# Patient Record
Sex: Male | Born: 1965 | Race: White | Hispanic: Yes | Marital: Married | Smoking: Former smoker
Health system: Southern US, Community
[De-identification: ages and names within clinical notes are randomized; demographics above are authoritative.]

## PROBLEM LIST (undated history)

## (undated) DIAGNOSIS — K219 Gastro-esophageal reflux disease without esophagitis: Secondary | ICD-10-CM

## (undated) DIAGNOSIS — Z87442 Personal history of urinary calculi: Secondary | ICD-10-CM

## (undated) DIAGNOSIS — R200 Anesthesia of skin: Secondary | ICD-10-CM

## (undated) DIAGNOSIS — I10 Essential (primary) hypertension: Secondary | ICD-10-CM

## (undated) DIAGNOSIS — R053 Chronic cough: Secondary | ICD-10-CM

## (undated) HISTORY — DX: Gastro-esophageal reflux disease without esophagitis: K21.9

---

## 2020-08-06 ENCOUNTER — Emergency Department (HOSPITAL_COMMUNITY): Payer: BC Managed Care – PPO

## 2020-08-06 ENCOUNTER — Inpatient Hospital Stay (HOSPITAL_COMMUNITY)
Admission: EM | Admit: 2020-08-06 | Discharge: 2020-09-04 | DRG: 871 | Disposition: A | Discharge status: Home Health | Payer: BC Managed Care – PPO | Attending: Internal Medicine | Admitting: Internal Medicine

## 2020-08-06 ENCOUNTER — Other Ambulatory Visit: Payer: Self-pay

## 2020-08-06 DIAGNOSIS — E872 Acidosis: Secondary | ICD-10-CM | POA: Diagnosis present

## 2020-08-06 DIAGNOSIS — M79605 Pain in left leg: Secondary | ICD-10-CM | POA: Diagnosis not present

## 2020-08-06 DIAGNOSIS — U071 COVID-19: Secondary | ICD-10-CM | POA: Diagnosis not present

## 2020-08-06 DIAGNOSIS — I8290 Acute embolism and thrombosis of unspecified vein: Secondary | ICD-10-CM

## 2020-08-06 DIAGNOSIS — J9383 Other pneumothorax: Secondary | ICD-10-CM | POA: Diagnosis not present

## 2020-08-06 DIAGNOSIS — R578 Other shock: Secondary | ICD-10-CM

## 2020-08-06 DIAGNOSIS — I469 Cardiac arrest, cause unspecified: Secondary | ICD-10-CM | POA: Diagnosis not present

## 2020-08-06 DIAGNOSIS — K3189 Other diseases of stomach and duodenum: Secondary | ICD-10-CM | POA: Diagnosis not present

## 2020-08-06 DIAGNOSIS — N179 Acute kidney failure, unspecified: Secondary | ICD-10-CM | POA: Diagnosis present

## 2020-08-06 DIAGNOSIS — F419 Anxiety disorder, unspecified: Secondary | ICD-10-CM | POA: Diagnosis present

## 2020-08-06 DIAGNOSIS — R0902 Hypoxemia: Secondary | ICD-10-CM

## 2020-08-06 DIAGNOSIS — I5041 Acute combined systolic (congestive) and diastolic (congestive) heart failure: Secondary | ICD-10-CM | POA: Diagnosis present

## 2020-08-06 DIAGNOSIS — I82403 Acute embolism and thrombosis of unspecified deep veins of lower extremity, bilateral: Secondary | ICD-10-CM | POA: Diagnosis not present

## 2020-08-06 DIAGNOSIS — J159 Unspecified bacterial pneumonia: Secondary | ICD-10-CM | POA: Diagnosis not present

## 2020-08-06 DIAGNOSIS — Z01818 Encounter for other preprocedural examination: Secondary | ICD-10-CM

## 2020-08-06 DIAGNOSIS — R7989 Other specified abnormal findings of blood chemistry: Secondary | ICD-10-CM | POA: Diagnosis present

## 2020-08-06 DIAGNOSIS — T501X5A Adverse effect of loop [high-ceiling] diuretics, initial encounter: Secondary | ICD-10-CM | POA: Diagnosis present

## 2020-08-06 DIAGNOSIS — E861 Hypovolemia: Secondary | ICD-10-CM | POA: Diagnosis not present

## 2020-08-06 DIAGNOSIS — Z23 Encounter for immunization: Secondary | ICD-10-CM

## 2020-08-06 DIAGNOSIS — J982 Interstitial emphysema: Secondary | ICD-10-CM | POA: Diagnosis not present

## 2020-08-06 DIAGNOSIS — R079 Chest pain, unspecified: Secondary | ICD-10-CM

## 2020-08-06 DIAGNOSIS — A4189 Other specified sepsis: Principal | ICD-10-CM | POA: Diagnosis present

## 2020-08-06 DIAGNOSIS — I248 Other forms of acute ischemic heart disease: Secondary | ICD-10-CM | POA: Diagnosis not present

## 2020-08-06 DIAGNOSIS — E875 Hyperkalemia: Secondary | ICD-10-CM | POA: Diagnosis not present

## 2020-08-06 DIAGNOSIS — R0602 Shortness of breath: Secondary | ICD-10-CM | POA: Diagnosis present

## 2020-08-06 DIAGNOSIS — I340 Nonrheumatic mitral (valve) insufficiency: Secondary | ICD-10-CM | POA: Diagnosis not present

## 2020-08-06 DIAGNOSIS — R739 Hyperglycemia, unspecified: Secondary | ICD-10-CM | POA: Diagnosis not present

## 2020-08-06 DIAGNOSIS — R680 Hypothermia, not associated with low environmental temperature: Secondary | ICD-10-CM | POA: Diagnosis not present

## 2020-08-06 DIAGNOSIS — E871 Hypo-osmolality and hyponatremia: Secondary | ICD-10-CM | POA: Diagnosis not present

## 2020-08-06 DIAGNOSIS — J069 Acute upper respiratory infection, unspecified: Secondary | ICD-10-CM | POA: Diagnosis not present

## 2020-08-06 DIAGNOSIS — K661 Hemoperitoneum: Secondary | ICD-10-CM | POA: Diagnosis not present

## 2020-08-06 DIAGNOSIS — D62 Acute posthemorrhagic anemia: Secondary | ICD-10-CM | POA: Diagnosis not present

## 2020-08-06 DIAGNOSIS — J9601 Acute respiratory failure with hypoxia: Secondary | ICD-10-CM | POA: Diagnosis not present

## 2020-08-06 DIAGNOSIS — D696 Thrombocytopenia, unspecified: Secondary | ICD-10-CM | POA: Diagnosis present

## 2020-08-06 DIAGNOSIS — R7401 Elevation of levels of liver transaminase levels: Secondary | ICD-10-CM

## 2020-08-06 DIAGNOSIS — N200 Calculus of kidney: Secondary | ICD-10-CM | POA: Diagnosis present

## 2020-08-06 DIAGNOSIS — J1282 Pneumonia due to coronavirus disease 2019: Secondary | ICD-10-CM | POA: Diagnosis not present

## 2020-08-06 DIAGNOSIS — R609 Edema, unspecified: Secondary | ICD-10-CM | POA: Diagnosis not present

## 2020-08-06 DIAGNOSIS — I110 Hypertensive heart disease with heart failure: Secondary | ICD-10-CM | POA: Diagnosis present

## 2020-08-06 DIAGNOSIS — R6520 Severe sepsis without septic shock: Secondary | ICD-10-CM | POA: Diagnosis present

## 2020-08-06 DIAGNOSIS — J9690 Respiratory failure, unspecified, unspecified whether with hypoxia or hypercapnia: Secondary | ICD-10-CM

## 2020-08-06 DIAGNOSIS — J9600 Acute respiratory failure, unspecified whether with hypoxia or hypercapnia: Secondary | ICD-10-CM

## 2020-08-06 DIAGNOSIS — I5021 Acute systolic (congestive) heart failure: Secondary | ICD-10-CM | POA: Diagnosis not present

## 2020-08-06 DIAGNOSIS — J189 Pneumonia, unspecified organism: Secondary | ICD-10-CM

## 2020-08-06 DIAGNOSIS — J80 Acute respiratory distress syndrome: Secondary | ICD-10-CM | POA: Diagnosis not present

## 2020-08-06 DIAGNOSIS — Z79899 Other long term (current) drug therapy: Secondary | ICD-10-CM | POA: Diagnosis not present

## 2020-08-06 DIAGNOSIS — R58 Hemorrhage, not elsewhere classified: Secondary | ICD-10-CM | POA: Diagnosis not present

## 2020-08-06 HISTORY — DX: COVID-19: U07.1

## 2020-08-06 HISTORY — DX: Acute respiratory failure, unspecified whether with hypoxia or hypercapnia: J96.00

## 2020-08-06 HISTORY — DX: Pneumonia, unspecified organism: J18.9

## 2020-08-06 LAB — CBC WITH DIFFERENTIAL/PLATELET
Abs Immature Granulocytes: 0.55 10*3/uL — ABNORMAL HIGH (ref 0.00–0.07)
Basophils Absolute: 0.1 10*3/uL (ref 0.0–0.1)
Basophils Relative: 0 %
Eosinophils Absolute: 0 10*3/uL (ref 0.0–0.5)
Eosinophils Relative: 0 %
HCT: 49.1 % (ref 39.0–52.0)
Hemoglobin: 15.9 g/dL (ref 13.0–17.0)
Immature Granulocytes: 3 %
Lymphocytes Relative: 7 %
Lymphs Abs: 1.5 10*3/uL (ref 0.7–4.0)
MCH: 30.6 pg (ref 26.0–34.0)
MCHC: 32.4 g/dL (ref 30.0–36.0)
MCV: 94.6 fL (ref 80.0–100.0)
Monocytes Absolute: 0.6 10*3/uL (ref 0.1–1.0)
Monocytes Relative: 3 %
Neutro Abs: 18.6 10*3/uL — ABNORMAL HIGH (ref 1.7–7.7)
Neutrophils Relative %: 87 %
Platelets: 344 10*3/uL (ref 150–400)
RBC: 5.19 MIL/uL (ref 4.22–5.81)
RDW: 14.1 % (ref 11.5–15.5)
WBC: 21.3 10*3/uL — ABNORMAL HIGH (ref 4.0–10.5)
nRBC: 0.4 % — ABNORMAL HIGH (ref 0.0–0.2)

## 2020-08-06 LAB — COMPREHENSIVE METABOLIC PANEL
ALT: 127 U/L — ABNORMAL HIGH (ref 0–44)
AST: 96 U/L — ABNORMAL HIGH (ref 15–41)
Albumin: 2.4 g/dL — ABNORMAL LOW (ref 3.5–5.0)
Alkaline Phosphatase: 104 U/L (ref 38–126)
Anion gap: 25 — ABNORMAL HIGH (ref 5–15)
BUN: 30 mg/dL — ABNORMAL HIGH (ref 6–20)
CO2: 15 mmol/L — ABNORMAL LOW (ref 22–32)
Calcium: 8.2 mg/dL — ABNORMAL LOW (ref 8.9–10.3)
Chloride: 96 mmol/L — ABNORMAL LOW (ref 98–111)
Creatinine, Ser: 1.95 mg/dL — ABNORMAL HIGH (ref 0.61–1.24)
GFR calc Af Amer: 43 mL/min — ABNORMAL LOW (ref >60)
GFR calc non Af Amer: 37 mL/min — ABNORMAL LOW (ref >60)
Glucose, Bld: 178 mg/dL — ABNORMAL HIGH (ref 70–99)
Potassium: 5.1 mmol/L (ref 3.5–5.1)
Sodium: 136 mmol/L (ref 135–145)
Total Bilirubin: 1.7 mg/dL — ABNORMAL HIGH (ref 0.3–1.2)
Total Protein: 7.4 g/dL (ref 6.5–8.1)

## 2020-08-06 LAB — C-REACTIVE PROTEIN: CRP: 34.5 mg/dL — ABNORMAL HIGH (ref <1.0)

## 2020-08-06 LAB — FERRITIN: Ferritin: 1450 ng/mL — ABNORMAL HIGH (ref 24–336)

## 2020-08-06 LAB — D-DIMER, QUANTITATIVE: D-Dimer, Quant: >20 ug/mL-FEU — ABNORMAL HIGH (ref 0.00–0.50)

## 2020-08-06 LAB — LACTATE DEHYDROGENASE: LDH: 1073 U/L — ABNORMAL HIGH (ref 98–192)

## 2020-08-06 LAB — FIBRINOGEN: Fibrinogen: 787 mg/dL — ABNORMAL HIGH (ref 210–475)

## 2020-08-06 LAB — PROCALCITONIN: Procalcitonin: 6.47 ng/mL

## 2020-08-06 LAB — LACTIC ACID, PLASMA
Lactic Acid, Venous: 8.9 mmol/L (ref 0.5–1.9)
Lactic Acid, Venous: 6.2 mmol/L (ref 0.5–1.9)

## 2020-08-06 LAB — PROTIME-INR
INR: 1.4 — ABNORMAL HIGH (ref 0.8–1.2)
Prothrombin Time: 16.9 seconds — ABNORMAL HIGH (ref 11.4–15.2)

## 2020-08-06 LAB — TRIGLYCERIDES: Triglycerides: 242 mg/dL — ABNORMAL HIGH (ref <150)

## 2020-08-06 LAB — SARS CORONAVIRUS 2 BY RT PCR (HOSPITAL ORDER, PERFORMED IN ~~LOC~~ HOSPITAL LAB): SARS Coronavirus 2: POSITIVE — AB

## 2020-08-06 MED ORDER — LACTATED RINGERS IV BOLUS
1000.0000 mL | Freq: Once | INTRAVENOUS | Status: AC
  Administered 2020-08-06: 1000 mL via INTRAVENOUS

## 2020-08-06 MED ORDER — SODIUM CHLORIDE 0.9 % IV SOLN
100.0000 mg | INTRAVENOUS | Status: AC
  Administered 2020-08-07 – 2020-08-10 (×4): 100 mg via INTRAVENOUS
  Filled 2020-08-06 (×5): qty 20

## 2020-08-06 MED ORDER — SODIUM CHLORIDE 0.9 % IV SOLN
200.0000 mg | Freq: Once | INTRAVENOUS | Status: AC
  Administered 2020-08-07: 200 mg via INTRAVENOUS
  Filled 2020-08-06: qty 40

## 2020-08-06 MED ORDER — DEXAMETHASONE SODIUM PHOSPHATE 10 MG/ML IJ SOLN
10.0000 mg | Freq: Once | INTRAMUSCULAR | Status: AC
  Administered 2020-08-06: 10 mg via INTRAVENOUS
  Filled 2020-08-06: qty 1

## 2020-08-06 NOTE — Progress Notes (Signed)
eLink Physician-Brief Progress Note Patient Name: Deacon Gadbois DOB: January 06, 1966 MRN: [id number]   Date of Service  08/06/2020  HPI/Events of Note  Dr Myrtis Ser discussed about Mr Calieb. yr old Covid tested as OPD +, results not available was hypoxemic via EMS. Now stable on BiPAP. CxR mild air space densities. Elevated wbc and d dimer, LDH.  Now stable.  eICU Interventions  - follow Covid test - ok to go to step down for now. If worsening give a call back for ICU step up.      Intervention Category Intermediate Interventions: Communication with other healthcare providers and/or family  Ranee Gosselin 08/06/2020, 9:05 PM

## 2020-08-06 NOTE — ED Provider Notes (Addendum)
[facility] Provider Note   CSN: [id number] Arrival date & time: 08/06/20  1914     History Chief Complaint  Patient presents with  . Respiratory Distress    Henry OhmOrlando May is a 54 y.o. male.   Shortness of Breath Severity:  Severe Onset quality:  Gradual Timing:  Constant Progression:  Worsening Context: URI (covid)   Relieved by:  Oxygen (cpap) Worsened by:  Nothing Ineffective treatments:  None tried      History reviewed. No pertinent past medical history.  Patient Active Problem List   Diagnosis Date Noted  . ARF (acute renal failure) (HCC) 08/07/2020  . Elevated LFTs 08/07/2020  . Acute respiratory disease due to COVID-19 virus 08/07/2020  . Pneumonia due to COVID-19 virus 08/07/2020  . Acute respiratory failure due to COVID-19 [facility](HCC) 08/06/2020     The histories are not reviewed yet. Please review them in the "History" navigator section and refresh this SmartLink.     Family History  Problem Relation Age of Onset  . Diabetes Mellitus II Neg Hx     Social History   Tobacco Use  . Smoking status: Never Smoker  . Smokeless tobacco: Never Used  Substance Use Topics  . Alcohol use: Not on file  . Drug use: Not on file    Home Medications Prior to Admission medications   Medication Sig Start Date End Date Taking? Authorizing Provider  acetaminophen (TYLENOL) 325 MG tablet Take 325-650 mg by mouth every 6 (six) hours as needed for mild pain, fever or headache.   Yes [provider]  aspirin EC 81 MG tablet Take 81 mg by mouth daily. Swallow whole.   Yes [provider]  naproxen sodium (ALEVE) 220 MG tablet Take 220 mg by mouth 2 (two) times daily as needed (fever, aches).   Yes [provider]  ampicillin (PRINCIPEN) 500 MG capsule Take 1,000 mg by mouth once.    [provider]    Allergies    Patient has no known allergies.  Review of Systems   Review of Systems   Unable to perform ROS: Acuity of condition  Respiratory: Positive for shortness of breath.     Physical Exam Updated Vital Signs BP (!) 148/99   Pulse 93   Temp 99.5 F (37.5 C) (Oral)   Resp (!) 33   Ht 6' (1.829 m)   Wt 106.6 kg   SpO2 90%   BMI 31.87 kg/m   Physical Exam Vitals and nursing note reviewed. Exam conducted with a chaperone present.  Constitutional:      General: He is in acute distress.     Appearance: He is ill-appearing.  HENT:     Head: Normocephalic and atraumatic.     Nose: No rhinorrhea.  Eyes:     General:        Right eye: No discharge.        Left eye: No discharge.     Conjunctiva/sclera: Conjunctivae normal.  Cardiovascular:     Rate and Rhythm: Regular rhythm. Tachycardia present.  Pulmonary:     Effort: Respiratory distress present.     Breath sounds: No stridor. Rhonchi present.  Abdominal:     General: Abdomen is flat. There is no distension.     Palpations: Abdomen is soft.  Musculoskeletal:        General: No deformity or signs of injury.  Skin:    General: Skin is warm and dry.     Comments:  Diaphoretic  Neurological:     General: No focal deficit present.     Mental Status: He is alert. Mental status is at baseline.     Motor: No weakness.     ED Results / Procedures / Treatments   Labs (all labs ordered are listed, but only abnormal results are displayed) Labs Reviewed  SARS CORONAVIRUS 2 BY RT PCR (HOSPITAL ORDER, PERFORMED IN [facility]) - Abnormal; Notable for the following components:      Result Value   SARS Coronavirus 2 POSITIVE (*)    All other components within normal limits  COMPREHENSIVE METABOLIC PANEL - Abnormal; Notable for the following components:   Chloride 96 (*)    CO2 15 (*)    Glucose, Bld 178 (*)    BUN 30 (*)    Creatinine, Ser 1.95 (*)    Calcium 8.2 (*)    Albumin 2.4 (*)    AST 96 (*)    ALT 127 (*)    Total Bilirubin 1.7 (*)    GFR calc non Af Amer 37 (*)    GFR calc Af  Amer 43 (*)    Anion gap 25 (*)    All other components within normal limits  LACTIC ACID, PLASMA - Abnormal; Notable for the following components:   Lactic Acid, Venous 8.9 (*)    All other components within normal limits  LACTIC ACID, PLASMA - Abnormal; Notable for the following components:   Lactic Acid, Venous 6.2 (*)    All other components within normal limits  CBC WITH DIFFERENTIAL/PLATELET - Abnormal; Notable for the following components:   WBC 21.3 (*)    nRBC 0.4 (*)    Neutro Abs 18.6 (*)    Abs Immature Granulocytes 0.55 (*)    All other components within normal limits  PROTIME-INR - Abnormal; Notable for the following components:   Prothrombin Time 16.9 (*)    INR 1.4 (*)    All other components within normal limits  URINALYSIS, ROUTINE W REFLEX MICROSCOPIC - Abnormal; Notable for the following components:   Color, Urine AMBER (*)    APPearance HAZY (*)    Hgb urine dipstick SMALL (*)    Protein, ur 100 (*)    All other components within normal limits  D-DIMER, QUANTITATIVE (NOT AT [facility]) - Abnormal; Notable for the following components:   D-Dimer, Quant >20.00 (*)    All other components within normal limits  LACTATE DEHYDROGENASE - Abnormal; Notable for the following components:   LDH 1,073 (*)    All other components within normal limits  FERRITIN - Abnormal; Notable for the following components:   Ferritin 1,450 (*)    All other components within normal limits  FIBRINOGEN - Abnormal; Notable for the following components:   Fibrinogen 787 (*)    All other components within normal limits  C-REACTIVE PROTEIN - Abnormal; Notable for the following components:   CRP 34.5 (*)    All other components within normal limits  TRIGLYCERIDES - Abnormal; Notable for the following components:   Triglycerides 242 (*)    All other components within normal limits  BASIC METABOLIC PANEL - Abnormal; Notable for the following components:   CO2 20 (*)    Glucose, Bld 182 (*)     BUN 35 (*)    Creatinine, Ser 1.58 (*)    Calcium 7.8 (*)    GFR calc non Af Amer 48 (*)    GFR calc Af Amer 56 (*)  Anion gap 17 (*)    All other components within normal limits  CBC - Abnormal; Notable for the following components:   WBC 14.5 (*)    All other components within normal limits  CREATININE, SERUM - Abnormal; Notable for the following components:   Creatinine, Ser 1.72 (*)    GFR calc non Af Amer 43 (*)    GFR calc Af Amer 50 (*)    All other components within normal limits  LACTIC ACID, PLASMA - Abnormal; Notable for the following components:   Lactic Acid, Venous 4.5 (*)    All other components within normal limits  LACTIC ACID, PLASMA - Abnormal; Notable for the following components:   Lactic Acid, Venous 4.3 (*)    All other components within normal limits  BRAIN NATRIURETIC PEPTIDE - Abnormal; Notable for the following components:   B Natriuretic Peptide 642.8 (*)    All other components within normal limits  HEMOGLOBIN A1C - Abnormal; Notable for the following components:   Hgb A1c MFr Bld 6.5 (*)    All other components within normal limits  MAGNESIUM - Abnormal; Notable for the following components:   Magnesium 2.8 (*)    All other components within normal limits  LACTIC ACID, PLASMA - Abnormal; Notable for the following components:   Lactic Acid, Venous 4.0 (*)    All other components within normal limits  LACTIC ACID, PLASMA - Abnormal; Notable for the following components:   Lactic Acid, Venous 2.7 (*)    All other components within normal limits  PROTIME-INR - Abnormal; Notable for the following components:   Prothrombin Time 15.7 (*)    INR 1.3 (*)    All other components within normal limits  I-STAT ARTERIAL BLOOD GAS, ED - Abnormal; Notable for the following components:   pH, Arterial 7.452 (*)    pCO2 arterial 31.8 (*)    Calcium, Ion 1.08 (*)    All other components within normal limits  CBG MONITORING, ED - Abnormal; Notable for the  following components:   Glucose-Capillary 177 (*)    All other components within normal limits  CBG MONITORING, ED - Abnormal; Notable for the following components:   Glucose-Capillary 167 (*)    All other components within normal limits  CBG MONITORING, ED - Abnormal; Notable for the following components:   Glucose-Capillary 141 (*)    All other components within normal limits  CBG MONITORING, ED - Abnormal; Notable for the following components:   Glucose-Capillary 158 (*)    All other components within normal limits  TROPONIN I (HIGH SENSITIVITY) - Abnormal; Notable for the following components:   Troponin I (High Sensitivity) 255 (*)    All other components within normal limits  TROPONIN I (HIGH SENSITIVITY) - Abnormal; Notable for the following components:   Troponin I (High Sensitivity) 204 (*)    All other components within normal limits  CULTURE, BLOOD (ROUTINE X 2)  CULTURE, BLOOD (ROUTINE X 2)  CULTURE, BLOOD (ROUTINE X 2)  PROCALCITONIN  HIV ANTIBODY (ROUTINE TESTING W REFLEX)  BLOOD GAS, ARTERIAL  CBC WITH DIFFERENTIAL/PLATELET  COMPREHENSIVE METABOLIC PANEL  C-REACTIVE PROTEIN  D-DIMER, QUANTITATIVE (NOT AT [facility])  PROTIME-INR  ABO/RH    EKG EKG Interpretation  Date/Time:  Sunday August 06 2020 23:54:24 EDT Ventricular Rate:  108 PR Interval:    QRS Duration: 105 QT Interval:  412 QTC Calculation: 553 R Axis:   60 Text Interpretation: Sinus tachycardia Low voltage, precordial leads Borderline T abnormalities, anterior leads  Prolonged QT interval No old tracing to compare Confirmed by Jacalyn Lefevre [phone]) on 08/07/2020 10:28:31 AM   Radiology DG Chest Portable 1 View  Result Date: 08/06/2020 CLINICAL DATA:  COVID positive. EXAM: PORTABLE CHEST 1 VIEW COMPARISON:  None. FINDINGS: Mild infiltrates are seen along the periphery of the right lung with mild to moderate severity left basilar infiltrate noted. There is no evidence of a pleural effusion or  pneumothorax. The heart size and mediastinal contours are within normal limits. The visualized skeletal structures are unremarkable. IMPRESSION: Mild right lung infiltrates with mild to moderate severity left basilar infiltrate. Electronically Signed   By: Aram Candela M.D.   On: 08/06/2020 19:45   ECHOCARDIOGRAM COMPLETE  Result Date: 08/07/2020    ECHOCARDIOGRAM REPORT   Patient Name:   Henry May Date of Exam: 08/07/2020 Medical Rec #:  [id number]      Height:       72.0 in Accession #:    [id number]     Weight:       235.0 lb Date of Birth:  11-Feb-1966      BSA:          2.282 m Patient Age:    54 years       BP:           132/103 mmHg Patient Gender: M              HR:           97 bpm. Exam Location:  Inpatient Procedure: 2D Echo, Cardiac Doppler and Color Doppler Indications:    Acute Respiratory Insufficiency 5018.82 / R06.89  History:        Patient has no prior history of Echocardiogram examinations.                 COVID-19 Positive.  Sonographer:    Tiffany Dance Referring Phys: [id number] Charlotte Sanes IMPRESSIONS  1. Left ventricular ejection fraction, by estimation, is 40 to 45%. The left ventricle has mildly decreased function. The left ventricle demonstrates regional wall motion abnormalities (see scoring diagram/findings for description). There is mild left ventricular hypertrophy. Left ventricular diastolic parameters are consistent with Grade I diastolic dysfunction (impaired relaxation). There is severe hypokinesis of the left ventricular, basal-mid inferior wall.  2. Right ventricular systolic function is mildly reduced. The right ventricular size is mildly enlarged.  3. Right atrial size was moderately dilated.  4. The mitral valve is grossly normal. No evidence of mitral valve regurgitation.  5. The aortic valve is tricuspid. Aortic valve regurgitation is not visualized.  6. Aortic dilatation noted. There is borderline dilatation at the level of the sinuses of Valsalva measuring 38  mm.  7. The inferior vena cava is normal in size with <50% respiratory variability, suggesting right atrial pressure of 8 mmHg. FINDINGS  Left Ventricle: Left ventricular ejection fraction, by estimation, is 40 to 45%. The left ventricle has mildly decreased function. The left ventricle demonstrates regional wall motion abnormalities. Severe hypokinesis of the left ventricular, basal-mid inferior wall. The left ventricular internal cavity size was normal in size. There is mild left ventricular hypertrophy. Left ventricular diastolic parameters are consistent with Grade I diastolic dysfunction (impaired relaxation). Indeterminate filling pressures. Right Ventricle: The right ventricular size is mildly enlarged. No increase in right ventricular wall thickness. Right ventricular systolic function is mildly reduced. Left Atrium: Left atrial size was normal in size. Right Atrium: Right atrial size was moderately dilated. Pericardium: There is no evidence of pericardial effusion.  Mitral Valve: The mitral valve is grossly normal. No evidence of mitral valve regurgitation. Tricuspid Valve: The tricuspid valve is grossly normal. Tricuspid valve regurgitation is not demonstrated. Aortic Valve: The aortic valve is tricuspid. Aortic valve regurgitation is not visualized. Pulmonic Valve: The pulmonic valve was not well visualized. Pulmonic valve regurgitation is not visualized. Aorta: Aortic dilatation noted. There is borderline dilatation at the level of the sinuses of Valsalva measuring 38 mm. Venous: The inferior vena cava is normal in size with less than 50% respiratory variability, suggesting right atrial pressure of 8 mmHg. IAS/Shunts: No atrial level shunt detected by color flow Doppler.  LEFT VENTRICLE PLAX 2D LVIDd:         4.97 cm  Diastology LVIDs:         3.91 cm  LV e' lateral: 5.33 cm/s LV PW:         1.16 cm  LV e' medial:  4.24 cm/s LV IVS:        0.97 cm LVOT diam:     2.10 cm LV SV:         48 LV SV Index:   21  LVOT Area:     3.46 cm  RIGHT VENTRICLE            IVC RV Basal diam:  3.34 cm    IVC diam: 1.85 cm RV Mid diam:    2.57 cm RV S prime:     8.81 cm/s TAPSE (M-mode): 2.2 cm LEFT ATRIUM             Index       RIGHT ATRIUM           Index LA diam:        3.00 cm 1.31 cm/m  RA Area:     25.10 cm LA Vol (A2C):   33.6 ml 14.73 ml/m RA Volume:   90.40 ml  39.62 ml/m LA Vol (A4C):   39.3 ml 17.22 ml/m LA Biplane Vol: 37.1 ml 16.26 ml/m  AORTIC VALVE LVOT Vmax:   77.60 cm/s LVOT Vmean:  49.300 cm/s LVOT VTI:    0.138 m  AORTA Ao Root diam: 3.80 cm Ao Asc diam:  3.30 cm MV A velocity: 68.65 cm/s                            SHUNTS                            Systemic VTI:  0.14 m                            Systemic Diam: 2.10 cm Zoila Shutter MD Electronically signed by Zoila Shutter MD Signature Date/Time: 08/07/2020/3:22:46 PM    Final    VAS [place] LOWER EXTREMITY VENOUS (DVT)  Result Date: 08/07/2020  Lower Venous DVTStudy Indications: Edema, and Covid positive.  Comparison Study: No prior. Performing Technologist: Marilynne Halsted RDMS, RVT  Examination Guidelines: A complete evaluation includes B-mode imaging, spectral Doppler, color Doppler, and power Doppler as needed of all accessible portions of each vessel. Bilateral testing is considered an integral part of a complete examination. Limited examinations for reoccurring indications may be performed as noted. The reflux portion of the exam is performed with the patient in reverse Trendelenburg.  +---------+---------------+---------+-----------+----------+--------------+ RIGHT    CompressibilityPhasicitySpontaneityPropertiesThrombus Aging +---------+---------------+---------+-----------+----------+--------------+ CFV  Full           Yes      Yes                                 +---------+---------------+---------+-----------+----------+--------------+ SFJ      Full                                                         +---------+---------------+---------+-----------+----------+--------------+ FV Prox  Full                                                        +---------+---------------+---------+-----------+----------+--------------+ FV Mid   Full                                                        +---------+---------------+---------+-----------+----------+--------------+ FV DistalFull                                                        +---------+---------------+---------+-----------+----------+--------------+ PFV      Full                                                        +---------+---------------+---------+-----------+----------+--------------+ POP      Full           Yes      Yes                                 +---------+---------------+---------+-----------+----------+--------------+ PTV      Full                                                        +---------+---------------+---------+-----------+----------+--------------+ PERO     Partial                                      Acute          +---------+---------------+---------+-----------+----------+--------------+   +---------+---------------+---------+-----------+----------+--------------+ LEFT     CompressibilityPhasicitySpontaneityPropertiesThrombus Aging +---------+---------------+---------+-----------+----------+--------------+ CFV      Full           Yes      Yes                                 +---------+---------------+---------+-----------+----------+--------------+ SFJ  Full                                                        +---------+---------------+---------+-----------+----------+--------------+ FV Prox  Full                                                        +---------+---------------+---------+-----------+----------+--------------+ FV Mid   Full                                                         +---------+---------------+---------+-----------+----------+--------------+ FV DistalNone           No       No                   Acute          +---------+---------------+---------+-----------+----------+--------------+ PFV      Full                                                        +---------+---------------+---------+-----------+----------+--------------+ POP      None           No       No                   Acute          +---------+---------------+---------+-----------+----------+--------------+ PTV      None                                         Acute          +---------+---------------+---------+-----------+----------+--------------+ PERO     None                                         Acute          +---------+---------------+---------+-----------+----------+--------------+     Summary: RIGHT: - Findings consistent with acute deep vein thrombosis involving the right peroneal veins. - No cystic structure found in the popliteal fossa.  LEFT: - Findings consistent with acute deep vein thrombosis involving the left femoral vein, left popliteal vein, left posterior tibial veins, and left peroneal veins.  *See table(s) above for measurements and observations. Electronically signed by Fabienne Bruns MD on 08/07/2020 at 5:10:42 PM.    Final     Procedures .Critical Care Performed by: Sabino Donovan, MD Authorized by: Sabino Donovan, MD   Critical care provider statement:    Critical care time (minutes):  35   Critical care was time spent personally by me on the following activities:  Discussions with consultants, evaluation of patient's response to treatment, examination of patient, ordering and  performing treatments and interventions, ordering and review of laboratory studies, ordering and review of radiographic studies, pulse oximetry, re-evaluation of patient's condition, obtaining history from patient or surrogate, review of old charts and development of treatment  plan with patient or surrogate   (including critical care time)  Medications Ordered in ED Medications  remdesivir 200 mg in sodium chloride 0.9% 250 mL IVPB (0 mg Intravenous Stopped 08/07/20 0308)    Followed by  remdesivir 100 mg in sodium chloride 0.9 % 100 mL IVPB (0 mg Intravenous Stopped 08/07/20 1006)  ondansetron (ZOFRAN) tablet 4 mg (has no administration in time range)    Or  ondansetron (ZOFRAN) injection 4 mg (has no administration in time range)  methylPREDNISolone sodium succinate (SOLU-MEDROL) 125 mg/2 mL injection 53.125 mg (53.125 mg Intravenous Given 08/07/20 1235)  ascorbic acid (VITAMIN C) tablet 500 mg (500 mg Oral Not Given 08/07/20 0945)  zinc sulfate capsule 220 mg (220 mg Oral Not Given 08/07/20 0945)  guaiFENesin-dextromethorphan (ROBITUSSIN DM) 100-10 MG/5ML syrup 10 mL (has no administration in time range)  chlorpheniramine-HYDROcodone (TUSSIONEX) 10-8 MG/5ML suspension 5 mL (has no administration in time range)  docusate sodium (COLACE) capsule 100 mg (has no administration in time range)  polyethylene glycol (MIRALAX / GLYCOLAX) packet 17 g (has no administration in time range)  insulin aspart (novoLOG) injection 0-20 Units (4 Units Subcutaneous Given 08/07/20 1653)  ceFEPIme (MAXIPIME) 2 g in sodium chloride 0.9 % 100 mL IVPB (0 g Intravenous Stopped 08/07/20 1555)  vancomycin (VANCOREADY) IVPB 1250 mg/250 mL (has no administration in time range)  enoxaparin (LOVENOX) injection 108 mg (108 mg Subcutaneous Given 08/07/20 1228)  dexamethasone (DECADRON) injection 10 mg (10 mg Intravenous Given 08/06/20 1940)  lactated ringers bolus 1,000 mL (0 mLs Intravenous Stopped 08/07/20 0709)  cefTRIAXone (ROCEPHIN) 2 g in sodium chloride 0.9 % 100 mL IVPB (0 g Intravenous Stopped 08/07/20 0251)  vancomycin (VANCOREADY) IVPB 2000 mg/400 mL (0 mg Intravenous Stopped 08/07/20 0846)    ED Course  I have reviewed the triage vital signs and the nursing notes.  Pertinent labs &  imaging results that were available during my care of the patient were reviewed by me and considered in my medical decision making (see chart for details).    MDM Rules/Calculators/A&P                          Acute hypoxic respiratory failure in the setting of Covid.  Initially on CPAP with EMS transition to BiPAP and weaned down to high flow nasal cannula 15 L.  Patient was initially tachypneic in the 60s respiratory rate has been improved and work of breathing is improved.  His O2 sats have remained above 88.  He has acute kidney injury he has lactic acidosis.  Chest x-ray findings were multi focal disease.  Steroids and remdesivir given immediately.  IV fluids were given.  Appears to have acute kidney injury however we have no baseline laboratory studies to compare to.  Elevated D-dimer, we will get a CT PE study.  I spoke to the intensivist and he thinks based on this information the patient is stable for the stepdown unit.  I consulted the hospitalist for admission they agreed to see the patient.  CRITICAL CARE Performed by: Sabino Donovan   Total critical care time: 35 minutes  Critical care time was exclusive of separately billable procedures and treating other patients.  Critical care was necessary to treat or prevent imminent  or life-threatening deterioration.  Critical care was time spent personally by me on the following activities: development of treatment plan with patient and/or surrogate as well as nursing, discussions with consultants, evaluation of patient's response to treatment, examination of patient, obtaining history from patient or surrogate, ordering and performing treatments and interventions, ordering and review of laboratory studies, ordering and review of radiographic studies, pulse oximetry and re-evaluation of patient's condition.    Final Clinical Impression(s) / ED Diagnoses Final diagnoses:  Acute hypoxemic respiratory failure (HCC)  COVID-19  Positive D dimer   AKI (acute kidney injury) [facility])    Rx / DC Orders ED Discharge Orders    None       Sabino Donovan, MD 08/06/20 2317    Sabino Donovan, MD 08/07/20 Ebony Cargo

## 2020-08-06 NOTE — Progress Notes (Signed)
Patient sats 95% on 60% Fi02 12/6 bipap.  VT 1200+.  Patient transitioned to salter high flow 15L and NRB 15L with sats 85-90%, lower when patient talking.  RR around 30.  RN, MD aware, BiPap on standby if needed.

## 2020-08-06 NOTE — ED Triage Notes (Signed)
Pt arrived via ems from home due to respiratory distress. Pt was dx with covid 8/12. Pt has been experiencing shob since 8am. EMs states uopon arrival o2 stats were 50% on room arir. Pt placed on NRB sats improved to 80%

## 2020-08-07 ENCOUNTER — Inpatient Hospital Stay (HOSPITAL_COMMUNITY): Payer: BC Managed Care – PPO

## 2020-08-07 ENCOUNTER — Encounter (HOSPITAL_COMMUNITY): Payer: Self-pay | Admitting: Internal Medicine

## 2020-08-07 DIAGNOSIS — U071 COVID-19: Secondary | ICD-10-CM

## 2020-08-07 DIAGNOSIS — J9601 Acute respiratory failure with hypoxia: Secondary | ICD-10-CM

## 2020-08-07 DIAGNOSIS — R609 Edema, unspecified: Secondary | ICD-10-CM

## 2020-08-07 DIAGNOSIS — J069 Acute upper respiratory infection, unspecified: Secondary | ICD-10-CM | POA: Diagnosis present

## 2020-08-07 DIAGNOSIS — N179 Acute kidney failure, unspecified: Secondary | ICD-10-CM

## 2020-08-07 DIAGNOSIS — R7989 Other specified abnormal findings of blood chemistry: Secondary | ICD-10-CM

## 2020-08-07 LAB — BASIC METABOLIC PANEL
Anion gap: 17 — ABNORMAL HIGH (ref 5–15)
BUN: 35 mg/dL — ABNORMAL HIGH (ref 6–20)
CO2: 20 mmol/L — ABNORMAL LOW (ref 22–32)
Calcium: 7.8 mg/dL — ABNORMAL LOW (ref 8.9–10.3)
Chloride: 101 mmol/L (ref 98–111)
Creatinine, Ser: 1.58 mg/dL — ABNORMAL HIGH (ref 0.61–1.24)
GFR calc Af Amer: 56 mL/min — ABNORMAL LOW (ref >60)
GFR calc non Af Amer: 48 mL/min — ABNORMAL LOW (ref >60)
Glucose, Bld: 182 mg/dL — ABNORMAL HIGH (ref 70–99)
Potassium: 4.3 mmol/L (ref 3.5–5.1)
Sodium: 138 mmol/L (ref 135–145)

## 2020-08-07 LAB — URINALYSIS, ROUTINE W REFLEX MICROSCOPIC
Bacteria, UA: NONE SEEN
Bilirubin Urine: NEGATIVE
Glucose, UA: NEGATIVE mg/dL
Ketones, ur: NEGATIVE mg/dL
Leukocytes,Ua: NEGATIVE
Nitrite: NEGATIVE
Protein, ur: 100 mg/dL — AB
Specific Gravity, Urine: 1.026 (ref 1.005–1.030)
pH: 5 (ref 5.0–8.0)

## 2020-08-07 LAB — LACTIC ACID, PLASMA
Lactic Acid, Venous: 4.5 mmol/L (ref 0.5–1.9)
Lactic Acid, Venous: 4.3 mmol/L (ref 0.5–1.9)
Lactic Acid, Venous: 4 mmol/L (ref 0.5–1.9)
Lactic Acid, Venous: 2.7 mmol/L (ref 0.5–1.9)

## 2020-08-07 LAB — CBG MONITORING, ED
Glucose-Capillary: 177 mg/dL — ABNORMAL HIGH (ref 70–99)
Glucose-Capillary: 167 mg/dL — ABNORMAL HIGH (ref 70–99)
Glucose-Capillary: 141 mg/dL — ABNORMAL HIGH (ref 70–99)
Glucose-Capillary: 158 mg/dL — ABNORMAL HIGH (ref 70–99)
Glucose-Capillary: 137 mg/dL — ABNORMAL HIGH (ref 70–99)

## 2020-08-07 LAB — I-STAT ARTERIAL BLOOD GAS, ED
Acid-base deficit: 1 mmol/L (ref 0.0–2.0)
Bicarbonate: 22.2 mmol/L (ref 20.0–28.0)
Calcium, Ion: 1.08 mmol/L — ABNORMAL LOW (ref 1.15–1.40)
HCT: 43 % (ref 39.0–52.0)
Hemoglobin: 14.6 g/dL (ref 13.0–17.0)
O2 Saturation: 97 %
Patient temperature: 98.6
Potassium: 3.8 mmol/L (ref 3.5–5.1)
Sodium: 138 mmol/L (ref 135–145)
TCO2: 23 mmol/L (ref 22–32)
pCO2 arterial: 31.8 mmHg — ABNORMAL LOW (ref 32.0–48.0)
pH, Arterial: 7.452 — ABNORMAL HIGH (ref 7.350–7.450)
pO2, Arterial: 83 mmHg (ref 83.0–108.0)

## 2020-08-07 LAB — CREATININE, SERUM
Creatinine, Ser: 1.72 mg/dL — ABNORMAL HIGH (ref 0.61–1.24)
GFR calc Af Amer: 50 mL/min — ABNORMAL LOW (ref >60)
GFR calc non Af Amer: 43 mL/min — ABNORMAL LOW (ref >60)

## 2020-08-07 LAB — MAGNESIUM: Magnesium: 2.8 mg/dL — ABNORMAL HIGH (ref 1.7–2.4)

## 2020-08-07 LAB — ABO/RH: ABO/RH(D): O POS

## 2020-08-07 LAB — CBC
HCT: 44.5 % (ref 39.0–52.0)
Hemoglobin: 14.6 g/dL (ref 13.0–17.0)
MCH: 30.3 pg (ref 26.0–34.0)
MCHC: 32.8 g/dL (ref 30.0–36.0)
MCV: 92.3 fL (ref 80.0–100.0)
Platelets: 232 10*3/uL (ref 150–400)
RBC: 4.82 MIL/uL (ref 4.22–5.81)
RDW: 14.1 % (ref 11.5–15.5)
WBC: 14.5 10*3/uL — ABNORMAL HIGH (ref 4.0–10.5)
nRBC: 0.1 % (ref 0.0–0.2)

## 2020-08-07 LAB — ECHOCARDIOGRAM COMPLETE
Height: 72 in
S' Lateral: 3.91 cm
Weight: 3760 oz

## 2020-08-07 LAB — HEMOGLOBIN A1C
Hgb A1c MFr Bld: 6.5 % — ABNORMAL HIGH (ref 4.8–5.6)
Mean Plasma Glucose: 139.85 mg/dL

## 2020-08-07 LAB — TROPONIN I (HIGH SENSITIVITY)
Troponin I (High Sensitivity): 255 ng/L (ref <18)
Troponin I (High Sensitivity): 204 ng/L (ref <18)

## 2020-08-07 LAB — HIV ANTIBODY (ROUTINE TESTING W REFLEX): HIV Screen 4th Generation wRfx: NONREACTIVE

## 2020-08-07 LAB — PROTIME-INR
INR: 1.3 — ABNORMAL HIGH (ref 0.8–1.2)
Prothrombin Time: 15.7 seconds — ABNORMAL HIGH (ref 11.4–15.2)

## 2020-08-07 LAB — BRAIN NATRIURETIC PEPTIDE: B Natriuretic Peptide: 642.8 pg/mL — ABNORMAL HIGH (ref 0.0–100.0)

## 2020-08-07 MED ORDER — SODIUM CHLORIDE 0.9 % IV SOLN
2.0000 g | Freq: Three times a day (TID) | INTRAVENOUS | Status: AC
  Administered 2020-08-07 – 2020-08-13 (×21): 2 g via INTRAVENOUS
  Filled 2020-08-07 (×23): qty 2

## 2020-08-07 MED ORDER — SODIUM CHLORIDE 0.9 % IV SOLN
500.0000 mg | INTRAVENOUS | Status: DC
  Administered 2020-08-07: 500 mg via INTRAVENOUS
  Filled 2020-08-07: qty 500

## 2020-08-07 MED ORDER — GUAIFENESIN-DM 100-10 MG/5ML PO SYRP
10.0000 mL | ORAL_SOLUTION | ORAL | Status: DC | PRN
  Administered 2020-08-15: 10 mL via ORAL
  Filled 2020-08-07 (×2): qty 10

## 2020-08-07 MED ORDER — VANCOMYCIN HCL 1250 MG/250ML IV SOLN
1250.0000 mg | INTRAVENOUS | Status: DC
  Administered 2020-08-08: 1250 mg via INTRAVENOUS
  Filled 2020-08-07: qty 250

## 2020-08-07 MED ORDER — ONDANSETRON HCL 4 MG PO TABS: 4.0000 mg | ORAL_TABLET | Freq: Four times a day (QID) | ORAL | Status: DC | PRN

## 2020-08-07 MED ORDER — ZINC SULFATE 220 (50 ZN) MG PO CAPS
220.0000 mg | ORAL_CAPSULE | Freq: Every day | ORAL | Status: DC
  Administered 2020-08-08 – 2020-08-17 (×10): 220 mg via ORAL
  Filled 2020-08-07 (×10): qty 1

## 2020-08-07 MED ORDER — METHYLPREDNISOLONE SODIUM SUCC 125 MG IJ SOLR
0.5000 mg/kg | Freq: Two times a day (BID) | INTRAMUSCULAR | Status: AC
  Administered 2020-08-07 – 2020-08-11 (×7): 53.125 mg via INTRAVENOUS
  Filled 2020-08-07 (×7): qty 2

## 2020-08-07 MED ORDER — ENOXAPARIN SODIUM 40 MG/0.4ML ~~LOC~~ SOLN
40.0000 mg | SUBCUTANEOUS | Status: DC
  Administered 2020-08-07: 40 mg via SUBCUTANEOUS
  Filled 2020-08-07: qty 0.4

## 2020-08-07 MED ORDER — HYDROCOD POLST-CPM POLST ER 10-8 MG/5ML PO SUER: 5.0000 mL | Freq: Two times a day (BID) | ORAL | Status: DC | PRN

## 2020-08-07 MED ORDER — ASCORBIC ACID 500 MG PO TABS
500.0000 mg | ORAL_TABLET | Freq: Every day | ORAL | Status: DC
  Administered 2020-08-08 – 2020-08-16 (×9): 500 mg via ORAL
  Filled 2020-08-07 (×9): qty 1

## 2020-08-07 MED ORDER — ONDANSETRON HCL 4 MG/2ML IJ SOLN: 4.0000 mg | Freq: Four times a day (QID) | INTRAMUSCULAR | Status: DC | PRN

## 2020-08-07 MED ORDER — POLYETHYLENE GLYCOL 3350 17 G PO PACK: 17.0000 g | PACK | Freq: Every day | ORAL | Status: DC | PRN

## 2020-08-07 MED ORDER — INSULIN ASPART 100 UNIT/ML ~~LOC~~ SOLN
0.0000 [IU] | SUBCUTANEOUS | Status: DC
  Administered 2020-08-07: 4 [IU] via SUBCUTANEOUS
  Administered 2020-08-07: 3 [IU] via SUBCUTANEOUS
  Administered 2020-08-07 (×2): 4 [IU] via SUBCUTANEOUS
  Administered 2020-08-07: 3 [IU] via SUBCUTANEOUS
  Administered 2020-08-08 (×3): 4 [IU] via SUBCUTANEOUS
  Administered 2020-08-08 (×2): 3 [IU] via SUBCUTANEOUS
  Administered 2020-08-09 (×2): 4 [IU] via SUBCUTANEOUS
  Administered 2020-08-09: 3 [IU] via SUBCUTANEOUS
  Administered 2020-08-10 – 2020-08-11 (×4): 4 [IU] via SUBCUTANEOUS
  Administered 2020-08-11 (×2): 3 [IU] via SUBCUTANEOUS
  Administered 2020-08-12: 4 [IU] via SUBCUTANEOUS
  Administered 2020-08-12: 3 [IU] via SUBCUTANEOUS
  Administered 2020-08-12: 7 [IU] via SUBCUTANEOUS
  Administered 2020-08-12 – 2020-08-13 (×3): 3 [IU] via SUBCUTANEOUS
  Administered 2020-08-13: 4 [IU] via SUBCUTANEOUS
  Administered 2020-08-13 – 2020-08-14 (×4): 3 [IU] via SUBCUTANEOUS
  Administered 2020-08-14: 4 [IU] via SUBCUTANEOUS
  Administered 2020-08-14: 3 [IU] via SUBCUTANEOUS
  Administered 2020-08-15: 4 [IU] via SUBCUTANEOUS
  Administered 2020-08-15: 7 [IU] via SUBCUTANEOUS
  Administered 2020-08-16: 4 [IU] via SUBCUTANEOUS

## 2020-08-07 MED ORDER — DOCUSATE SODIUM 100 MG PO CAPS: 100.0000 mg | ORAL_CAPSULE | Freq: Two times a day (BID) | ORAL | Status: DC | PRN

## 2020-08-07 MED ORDER — SODIUM CHLORIDE 0.9 % IV SOLN
2.0000 g | Freq: Once | INTRAVENOUS | Status: AC
  Administered 2020-08-07: 2 g via INTRAVENOUS
  Filled 2020-08-07: qty 20

## 2020-08-07 MED ORDER — ENOXAPARIN SODIUM 40 MG/0.4ML ~~LOC~~ SOLN: 40.0000 mg | SUBCUTANEOUS | Status: DC

## 2020-08-07 MED ORDER — SODIUM CHLORIDE 0.9 % IV SOLN: 2.0000 g | INTRAVENOUS | Status: DC

## 2020-08-07 MED ORDER — VANCOMYCIN HCL 2000 MG/400ML IV SOLN
2000.0000 mg | Freq: Once | INTRAVENOUS | Status: AC
  Administered 2020-08-07: 2000 mg via INTRAVENOUS
  Filled 2020-08-07: qty 400

## 2020-08-07 MED ORDER — ENOXAPARIN SODIUM 120 MG/0.8ML ~~LOC~~ SOLN
108.0000 mg | Freq: Two times a day (BID) | SUBCUTANEOUS | Status: DC
  Administered 2020-08-07 – 2020-08-08 (×3): 108 mg via SUBCUTANEOUS
  Filled 2020-08-07 (×3): qty 0.72

## 2020-08-07 NOTE — Progress Notes (Signed)
  Echocardiogram 2D Echocardiogram has been performed.  Henry May 08/07/2020, 1:57 PM

## 2020-08-07 NOTE — Progress Notes (Signed)
Venous duplex lower  has been completed. Refer to [facility] under chart review to view preliminary results.   08/07/2020  10:55 AM Earon Rivest, Gerarda Gunther

## 2020-08-07 NOTE — H&P (Signed)
History and Physical    [name] NWG:[id number] DOB: 1966/09/04 DOA: 08/06/2020  PCP: No primary care provider on file.  Patient coming from: Home.  Chief Complaint: Shortness of breath.  HPI: Henry May is a 54 y.o. male with no significant past medical history presents to the ER with complaints of shortness of breath.  Patient states his wife was diagnosed with COVID-19 infection last week.  He has been short of breath for the last 5 days which is progressively worsened.  Denies any chest pain nausea vomiting or diarrhea.  ED Course: In the ER patient was hypoxic requiring 15 L of high flow oxygen to maintain sats per initially was on BiPAP.  Chest x-ray showed bilateral infiltrates.  EKG shows sinus tachycardia with labs significant for creatinine 1.9 Covid test positive elevated LFTs with AST of 96 ALT of 127 total bilirubin 1.7 blood glucose 178 WBC 21.3 procalcitonin 6.47 CRP 34.5 lactic acid 8.9 improved to 6.2 after fluid bolus.  Patient was started on Decadron and remdesivir for acute respiratory failure from Covid and admitted for further management.  Review of Systems: As per HPI, rest all negative.   History reviewed. No pertinent past medical history.  History reviewed. No pertinent surgical history.   reports that he has never smoked. He has never used smokeless tobacco. No history on file for alcohol use and drug use.  No Known Allergies  Family History  Problem Relation Age of Onset  . Diabetes Mellitus II Neg Hx     Prior to Admission medications   Not on File    Physical Exam: Constitutional: Moderately built and nourished. Vitals:   08/06/20 2145 08/06/20 2300 08/07/20 0000 08/07/20 0045  BP: 110/85 131/83 111/89 (!) 126/94  Pulse: (!) 113 (!) 111 (!) 104 (!) 105  Resp: (!) 32 (!) 38 (!) 39 (!) 36  Temp:      TempSrc:      SpO2: (!) 87% 92% (!) 89% (!) 88%   Eyes: Anicteric no pallor. ENMT: No discharge from the ears eyes nose or  mouth. Neck: No mass felt.  No neck rigidity. Respiratory: No rhonchi or crepitations. Cardiovascular: S1-S2 heard. Abdomen: Soft nontender bowel sounds present. Musculoskeletal: No edema. Skin: No rash. Neurologic: Alert awake oriented to time place and person.  Moves all extremities. Psychiatric: Appears normal.  Normal affect.   Labs on Admission: I have personally reviewed following labs and imaging studies  CBC: Recent Labs  Lab 08/06/20 1933  WBC 21.3*  NEUTROABS 18.6*  HGB 15.9  HCT 49.1  MCV 94.6  PLT 344   Basic Metabolic Panel: Recent Labs  Lab 08/06/20 1933  NA 136  K 5.1  CL 96*  CO2 15*  GLUCOSE 178*  BUN 30*  CREATININE 1.95*  CALCIUM 8.2*   GFR: CrCl cannot be calculated (Unknown ideal weight.). Liver Function Tests: Recent Labs  Lab 08/06/20 1933  AST 96*  ALT 127*  ALKPHOS 104  BILITOT 1.7*  PROT 7.4  ALBUMIN 2.4*   No results for input(s): LIPASE, AMYLASE in the last 168 hours. No results for input(s): AMMONIA in the last 168 hours. Coagulation Profile: Recent Labs  Lab 08/06/20 1933  INR 1.4*   Cardiac Enzymes: No results for input(s): CKTOTAL, CKMB, CKMBINDEX, TROPONINI in the last 168 hours. BNP (last 3 results) No results for input(s): PROBNP in the last 8760 hours. HbA1C: No results for input(s): HGBA1C in the last 72 hours. CBG: No results for input(s): GLUCAP in the  last 168 hours. Lipid Profile: Recent Labs    08/06/20 1933  TRIG 242*   Thyroid Function Tests: No results for input(s): TSH, T4TOTAL, FREET4, T3FREE, THYROIDAB in the last 72 hours. Anemia Panel: Recent Labs    08/06/20 1933  FERRITIN 1,450*   Urine analysis: No results found for: COLORURINE, APPEARANCEUR, LABSPEC, PHURINE, GLUCOSEU, HGBUR, BILIRUBINUR, KETONESUR, PROTEINUR, UROBILINOGEN, NITRITE, LEUKOCYTESUR Sepsis Labs: @LABRCNTIP (procalcitonin:4,lacticidven:4) ) Recent Results (from the past 240 hour(s))  SARS Coronavirus 2 by RT PCR  (hospital order, performed in [facility] hospital lab) Nasopharyngeal Nasopharyngeal Swab     Status: Abnormal   Collection Time: 08/06/20  7:44 PM   Specimen: Nasopharyngeal Swab  Result Value Ref Range Status   SARS Coronavirus 2 POSITIVE (A) NEGATIVE Final    Comment: RESULT CALLED TO, READ BACK BY AND VERIFIED WITH: RN JASMINE PATERSON AT 2129 BY MESSAN HOUEGNIFIO ON 08/06/2020 (NOTE) SARS-CoV-2 target nucleic acids are DETECTED  SARS-CoV-2 RNA is generally detectable in upper respiratory specimens  during the acute phase of infection.  Positive results are indicative  of the presence of the identified virus, but do not rule out bacterial infection or co-infection with other pathogens not detected by the test.  Clinical correlation with patient history and  other diagnostic information is necessary to determine patient infection status.  The expected result is negative.  Fact Sheet for Patients:   08/08/2020   Fact Sheet for Healthcare Providers:   BoilerBrush.com.cy    This test is not yet approved or cleared by the https://pope.com/ FDA and  has been authorized for detection and/or diagnosis of SARS-CoV-2 by FDA under an Emergency Use Authorization (EUA).  This EUA will remain  in effect (meaning this test can be used) for the duration of  the COVID-19 declaration under Section 564(b)(1) of the Act, 21 U.S.C. section 360-bbb-3(b)(1), unless the authorization is terminated or revoked sooner.  Performed at [facility], [address] [place], [place]      Radiological Exams on Admission: DG Chest Portable 1 View  Result Date: 08/06/2020 CLINICAL DATA:  COVID positive. EXAM: PORTABLE CHEST 1 VIEW COMPARISON:  None. FINDINGS: Mild infiltrates are seen along the periphery of the right lung with mild to moderate severity left basilar infiltrate noted. There is no evidence of a pleural effusion or pneumothorax.  The heart size and mediastinal contours are within normal limits. The visualized skeletal structures are unremarkable. IMPRESSION: Mild right lung infiltrates with mild to moderate severity left basilar infiltrate. Electronically Signed   By: 08/08/2020 M.D.   On: 08/06/2020 19:45    EKG: Independently reviewed.  Sinus tachycardia.  Assessment/Plan Principal Problem:   Acute respiratory failure due to COVID-19 [facility]) Active Problems:   ARF (acute renal failure) (HCC)   Elevated LFTs   Acute respiratory disease due to COVID-19 virus    1. Acute respiratory failure with hypoxia secondary to COVID-19 infection for which patient has been already started on remdesivir and I placed patient on IV Solu-Medrol patient has consented for Actemra/Bircatinib after explaining to patient about the off label use of it and the side effects and contraindications.  Since patient's procalcitonin is elevated I placed patient on empiric antibiotics.  Recheck lactic acid levels and inflammatory markers.  Since patient has marked elevated D-dimer CT angiogram of the chest is pending. 2. Acute renal failure with no old labs to compare.  Patient received fluid bolus will repeat metabolic panel.  UA is pending. 3. Elevated LFTs -no  old labs to compare.  Could be from Covid infection.  Follow LFTs.  Check acute hepatitis panel with next blood draw.  Since patient has acute respiratory failure with hypoxia with Covid infection patient will need close monitoring for any further deterioration in inpatient status.  I have consulted pulmonary critical care.   DVT prophylaxis: Lovenox. Code Status: Full code. Family Communication: Discussed with patient. Disposition Plan: Home when stable. Consults called: Pulmonary critical care. Admission status: Inpatient.   Eduard Clos MD [facility] Pager [phone].  If 7PM-7AM, please contact night-coverage [email or web address] Password [email or web address]  08/07/2020,  1:17 AM

## 2020-08-07 NOTE — Progress Notes (Addendum)
PCCM Interval Note  54 year old male with COVID pneumonia.  S: Admitted to ICU overnight. Has remained in the ED and started on BiPAP. Reports he is more comfortable and breathing has improved  Blood pressure (!) 137/93, pulse 87, temperature 99.5 F (37.5 C), temperature source Oral, resp. rate (!) 36, height 6' (1.829 m), weight 106.6 kg, SpO2 95 %.  Assessment/Plan Acute hypoxemic respiratory failure secondary to COVID-19 pneumonia  - Tolerating BiPAP  - Monitor for signs of respiratory distress (accessory muscle use, nasal flaring) or changes in mental status  - Target SpO2 >85%  - Continue Solumedrol 1 mg/kg daily, remdesivir  - ABG PRN   Sepsis secondary to above Lactic acidosis  - Continue broad spectrum abx: Vanc, Cefepime  - Trend lactic acid  Acute bilateral lower extremity DVTs  - Start treatment dose lovenox  - Daily PT/INR  Demand ischemia: Troponin trending down  - No further steps needed  DVT ppx: Lovenox Code status: Full Dispo: Admit to ICU when bed available  CC: 40 min No charge  Mechele Collin, M.D. [facility] Pulmonary/Critical Care Medicine 08/07/2020 10:23 AM

## 2020-08-07 NOTE — Progress Notes (Signed)
Pharmacy Antibiotic Note  Henry May is a 54 y.o. male admitted on 08/06/2020 with sepsis. Pt COVID + 11 days ago.  Pharmacy has been consulted for Vancomycin and Cefepime dosing.  Plan: Cefepime 2gm IV q8h Vancomycin 2000mg  IV now then 1250mg  IV Q 24 hrs. Will f/u renal function, micro data, and pt's clinical condition Vanc levels prn     Temp (24hrs), Avg:99.5 F (37.5 C), Min:99.5 F (37.5 C), Max:99.5 F (37.5 C)  Recent Labs  Lab 08/06/20 1933 08/06/20 2113 08/07/20 0117 08/07/20 0120 08/07/20 0319 08/07/20 0351  WBC 21.3*  --   --   --  14.5*  --   CREATININE 1.95*  --  1.72*  --  1.58*  --   LATICACIDVEN 8.9* 6.2*  --  4.5*  --  4.3*    CrCl cannot be calculated (Unknown ideal weight.).    No Known Allergies  Antimicrobials this admission: 8/23 Azith/Rocephin x 1 8/23 Vanc >>  8/23 Cefepime >>   Microbiology results: 8/22 BCx:  8/22 Covid: positive  Thank you for allowing pharmacy to be a part of this patient's care.  9/22, PharmD, BCPS Please see amion for complete clinical pharmacist phone list 08/07/2020 5:37 AM

## 2020-08-07 NOTE — Progress Notes (Signed)
ANTICOAGULATION CONSULT NOTE - Initial Consult  Pharmacy Consult for lovenox Indication: DVT  No Known Allergies  Patient Measurements: Height: 6' (182.9 cm) Weight: 106.6 kg (235 lb) IBW/kg (Calculated) : 77.6  Vital Signs: BP: 134/96 (08/23 1000) Pulse Rate: 85 (08/23 1030)  Labs: Recent Labs    08/06/20 1933 08/06/20 1933 08/07/20 0117 08/07/20 0319 08/07/20 0531  HGB 15.9   < >  --  14.6 14.6  HCT 49.1  --   --  44.5 43.0  PLT 344  --   --  232  --   LABPROT 16.9*  --   --   --   --   INR 1.4*  --   --   --   --   CREATININE 1.95*  --  1.72* 1.58*  --   TROPONINIHS  --   --  255* 204*  --    < > = values in this interval not displayed.    Estimated Creatinine Clearance: 65.9 mL/min (A) (by C-G formula based on SCr of 1.58 mg/dL (H)).   Medical History: History reviewed. No pertinent past medical history.  Assessment: Henry May presenting with COVID pna, d-dimer >20, with acute bilateral DVTs found.  Not on anticoagulation PTA.    Goal of Therapy:  Anti-Xa level 0.6-1 units/ml 4hrs after LMWH dose given Monitor platelets by anticoagulation protocol: Yes   Plan:  Lovenox 1mg /kg (108mg ) SQ every 12 hours Monitor renal function, CBC, s/s bleeding F/u ability to transition to PO  , PharmD Clinical Pharmacist ED Pharmacist Phone # [phone] 08/07/2020 10:55 AM

## 2020-08-07 NOTE — Consult Note (Signed)
NAME:  Henry May, MRN:  [id number], DOB:  24-Mar-1966, LOS: 1 ADMISSION DATE:  08/06/2020, CONSULTATION DATE:  08/07/20 REFERRING MD:  Toniann Fail, CHIEF COMPLAINT:  Dyspnea   Brief History   54 y/o M with no significant PMH who presented with increasing shortness of breath. He was diagnosed with Covid-19 on 8/12 and over the last few days his shortness of breath has progressively worsened. O2 sats were 55% on room air on EMS arrival. Chest x-ray with bilateral infiltrates and patient was high flow nasal cannula with EF. PCCM consulted for admission  History of present illness   Henry May 54 year old male with no significant past medical history, non-smoker,  who was diagnosed with Covid-19 on 8/12 along with his wife. He is scheduled to get the Covid-19 vaccine but has not received any doses yet. He reports cough with increasing chest pressure so called on EMS arrival his oxygen was reportedly 55% on room air. He initially improved on BiPAP, chest x-ray with bilateral infiltrates. Creatinine 1.9, glucose 170, bicarb 15, anion gap 25, lactic acid 8.9, WBC 21k, D-dimer >20, LDH 1,073, Ferritin 1,450, CRP 34.    He was initially placed on Bipap, then trial of HFNC. PCCM consulted for increasing WOB on 15L HFNC with non-rebreather.  Pt denies chest pain or recent lower extremity pain or edema  Past Medical History   has no past medical history on file.   Significant Hospital Events   8/22 admit to hospitalists 8/23 PCCM consult, ICU txfr  Consults:  PCCM  Procedures:    Significant Diagnostic Tests:  8/22 CXR>>Mild right lung infiltrates with mild to moderate severity left basilar infiltrate.  Micro Data:  8/22 Covid-19>>positive 8/22 BCx2>>  Antimicrobials:  Remdesevir 8/22- Azithromycin 8/22- Ceftriaxone 8/22-  Interim history/subjective:  As above  Objective   Blood pressure 130/89, pulse (!) 102, temperature 99.5 F (37.5 C), temperature source Oral, resp. rate (!)  35, SpO2 (!) 89 %.       No intake or output data in the 24 hours ending 08/07/20 0303 There were no vitals filed for this visit.  General:  Well-nourished, M tachypneic in moderate respiratory distress HEENT: MM pink/moist Neuro: awake, alert, oriented CV: s1s2 tachycardoc, no m/r/g PULM:  Decreased in bilateral bases, no significant wheezing or rhonchi GI: soft, bsx4 active Extremities: warm/dry, no edema  Skin: no rashes or lesions  Resolved Hospital Problem list     Assessment & Plan:   Acute Hypoxic Respiratory Failure secondary to Covid-19 PNA and ARDS -Admit to critical care, trial of Bipap, ABG, high risk of intubation -Continue Remdesevir, Solumedrol, consider Baricitinib/Actemra -inflammatory markers and D-dimer extremely high, consider PE, pt is not able to lay flat for CT chest, will check LE dopplers and echo, may need empiric anti-coagulation -Troponin and BNP pending  Severe Sepsis Very elevated lactic acid with leukocytosis 11 days after covid diagnosis, consider secondary infection -blood cultures -empiric Vanc/cefepime -Got one L bolus, weigh further IVF with likely developing ARDS   AGMA Bicarb 15 and Gap 25, likely secondary to lactic acidosis, improving  Acute Kidney Injury Initial creatinine 1.9, unclear what his baseline is, improved after IVF so suspect this is pre-renal volume depletion   Hyperglycemia -SSI and check A1c  Qtc prolongation >500 -Check mag level and repeat EKG in the AM, avoid Qtc prolonging medications  Best practice:  Diet:  NPO on bipap, fluids ok Pain/Anxiety/Delirium protocol (if indicated): n/a VAP protocol (if indicated): n/a DVT prophylaxis: heparin GI prophylaxis: n/a Glucose  control: SSI Mobility: bed rest Code Status: full code Family Communication: Spoke to patient's wife and updated her Disposition: ICU  Labs   CBC: Recent Labs  Lab 08/06/20 1933  WBC 21.3*  NEUTROABS 18.6*  HGB 15.9  HCT 49.1  MCV  94.6  PLT 344    Basic Metabolic Panel: Recent Labs  Lab 08/06/20 1933  NA 136  K 5.1  CL 96*  CO2 15*  GLUCOSE 178*  BUN 30*  CREATININE 1.95*  CALCIUM 8.2*   GFR: CrCl cannot be calculated (Unknown ideal weight.). Recent Labs  Lab 08/06/20 1933 08/06/20 2113  PROCALCITON 6.47  --   WBC 21.3*  --   LATICACIDVEN 8.9* 6.2*    Liver Function Tests: Recent Labs  Lab 08/06/20 1933  AST 96*  ALT 127*  ALKPHOS 104  BILITOT 1.7*  PROT 7.4  ALBUMIN 2.4*   No results for input(s): LIPASE, AMYLASE in the last 168 hours. No results for input(s): AMMONIA in the last 168 hours.  ABG No results found for: PHART, PCO2ART, PO2ART, HCO3, TCO2, ACIDBASEDEF, O2SAT   Coagulation Profile: Recent Labs  Lab 08/06/20 1933  INR 1.4*    Cardiac Enzymes: No results for input(s): CKTOTAL, CKMB, CKMBINDEX, TROPONINI in the last 168 hours.  HbA1C: No results found for: HGBA1C  CBG: No results for input(s): GLUCAP in the last 168 hours.  Review of Systems:   Negative except as noted in HPI  Past Medical History  He,  has no past medical history on file.   Surgical History   History reviewed. No pertinent surgical history.   Social History   reports that he has never smoked. He has never used smokeless tobacco.   Family History   His family history is negative for Diabetes Mellitus II.   Allergies No Known Allergies   Home Medications  Prior to Admission medications   Not on File     Critical care time: 50 minutes     CRITICAL CARE Performed by: Darcella Gasman Jimmylee Ratterree   Total critical care time: 50 minutes  Critical care time was exclusive of separately billable procedures and treating other patients.  Critical care was necessary to treat or prevent imminent or life-threatening deterioration.  Critical care was time spent personally by me on the following activities: development of treatment plan with patient and/or surrogate as well as nursing, discussions  with consultants, evaluation of patient's response to treatment, examination of patient, obtaining history from patient or surrogate, ordering and performing treatments and interventions, ordering and review of laboratory studies, ordering and review of radiographic studies, pulse oximetry and re-evaluation of patient's condition.  Darcella Gasman Stryder Poitra, PA-C

## 2020-08-08 DIAGNOSIS — J9601 Acute respiratory failure with hypoxia: Secondary | ICD-10-CM | POA: Diagnosis present

## 2020-08-08 LAB — CBC WITH DIFFERENTIAL/PLATELET
Abs Immature Granulocytes: 0.33 10*3/uL — ABNORMAL HIGH (ref 0.00–0.07)
Basophils Absolute: 0.1 10*3/uL (ref 0.0–0.1)
Basophils Relative: 0 %
Eosinophils Absolute: 0 10*3/uL (ref 0.0–0.5)
Eosinophils Relative: 0 %
HCT: 43.9 % (ref 39.0–52.0)
Hemoglobin: 14.2 g/dL (ref 13.0–17.0)
Immature Granulocytes: 2 %
Lymphocytes Relative: 4 %
Lymphs Abs: 0.9 10*3/uL (ref 0.7–4.0)
MCH: 30.7 pg (ref 26.0–34.0)
MCHC: 32.3 g/dL (ref 30.0–36.0)
MCV: 94.8 fL (ref 80.0–100.0)
Monocytes Absolute: 0.4 10*3/uL (ref 0.1–1.0)
Monocytes Relative: 2 %
Neutro Abs: 18.2 10*3/uL — ABNORMAL HIGH (ref 1.7–7.7)
Neutrophils Relative %: 92 %
Platelets: 335 10*3/uL (ref 150–400)
RBC: 4.63 MIL/uL (ref 4.22–5.81)
RDW: 14.6 % (ref 11.5–15.5)
WBC: 19.9 10*3/uL — ABNORMAL HIGH (ref 4.0–10.5)
nRBC: 0 % (ref 0.0–0.2)

## 2020-08-08 LAB — COMPREHENSIVE METABOLIC PANEL
ALT: 78 U/L — ABNORMAL HIGH (ref 0–44)
AST: 55 U/L — ABNORMAL HIGH (ref 15–41)
Albumin: 2.2 g/dL — ABNORMAL LOW (ref 3.5–5.0)
Alkaline Phosphatase: 89 U/L (ref 38–126)
Anion gap: 14 (ref 5–15)
BUN: 50 mg/dL — ABNORMAL HIGH (ref 6–20)
CO2: 21 mmol/L — ABNORMAL LOW (ref 22–32)
Calcium: 8.3 mg/dL — ABNORMAL LOW (ref 8.9–10.3)
Chloride: 107 mmol/L (ref 98–111)
Creatinine, Ser: 1.28 mg/dL — ABNORMAL HIGH (ref 0.61–1.24)
GFR calc Af Amer: >60 mL/min (ref >60)
GFR calc non Af Amer: >60 mL/min (ref >60)
Glucose, Bld: 156 mg/dL — ABNORMAL HIGH (ref 70–99)
Potassium: 3.9 mmol/L (ref 3.5–5.1)
Sodium: 142 mmol/L (ref 135–145)
Total Bilirubin: 0.8 mg/dL (ref 0.3–1.2)
Total Protein: 6.3 g/dL — ABNORMAL LOW (ref 6.5–8.1)

## 2020-08-08 LAB — PROTIME-INR
INR: 1.3 — ABNORMAL HIGH (ref 0.8–1.2)
Prothrombin Time: 15.8 seconds — ABNORMAL HIGH (ref 11.4–15.2)

## 2020-08-08 LAB — CBG MONITORING, ED
Glucose-Capillary: 144 mg/dL — ABNORMAL HIGH (ref 70–99)
Glucose-Capillary: 157 mg/dL — ABNORMAL HIGH (ref 70–99)
Glucose-Capillary: 163 mg/dL — ABNORMAL HIGH (ref 70–99)
Glucose-Capillary: 136 mg/dL — ABNORMAL HIGH (ref 70–99)
Glucose-Capillary: 114 mg/dL — ABNORMAL HIGH (ref 70–99)
Glucose-Capillary: 178 mg/dL — ABNORMAL HIGH (ref 70–99)
Glucose-Capillary: 123 mg/dL — ABNORMAL HIGH (ref 70–99)

## 2020-08-08 LAB — D-DIMER, QUANTITATIVE: D-Dimer, Quant: >20 ug/mL-FEU — ABNORMAL HIGH (ref 0.00–0.50)

## 2020-08-08 LAB — C-REACTIVE PROTEIN: CRP: 19.7 mg/dL — ABNORMAL HIGH (ref <1.0)

## 2020-08-08 MED ORDER — ENOXAPARIN SODIUM 120 MG/0.8ML ~~LOC~~ SOLN
105.0000 mg | Freq: Two times a day (BID) | SUBCUTANEOUS | Status: DC
  Filled 2020-08-08 (×2): qty 0.7

## 2020-08-08 MED ORDER — VANCOMYCIN HCL 750 MG/150ML IV SOLN
750.0000 mg | Freq: Two times a day (BID) | INTRAVENOUS | Status: DC
  Administered 2020-08-09: 750 mg via INTRAVENOUS
  Filled 2020-08-08 (×2): qty 150

## 2020-08-08 NOTE — Progress Notes (Signed)
Pharmacy Antibiotic Note  Henry May is a 54 y.o. male admitted on 08/06/2020 with sepsis. Pt COVID + 11 days ago.  Pharmacy has been consulted for Vancomycin and Cefepime dosing.  The patient was noted to have AKI which is improving - SCr down to 1.28, estimated CrCl>60 ml/min. Cefepime dose appropriate, will adjust Vancomycin dose today.  Plan: Cefepime 2gm IV q8h Adjust Vancomycin to 750 mg IV every 12 hours Will continue to follow renal function, culture results, LOT, and antibiotic de-escalation plans   Height: 6' (182.9 cm) Weight: 106.6 kg (235 lb) IBW/kg (Calculated) : 77.6  No data recorded.  Recent Labs  Lab 08/06/20 1933 08/06/20 1933 08/06/20 2113 08/07/20 0117 08/07/20 0120 08/07/20 0319 08/07/20 0351 08/07/20 0940 08/07/20 1244 08/08/20 0626  WBC 21.3*  --   --   --   --  14.5*  --   --   --  19.9*  CREATININE 1.95*  --   --  1.72*  --  1.58*  --   --   --  1.28*  LATICACIDVEN 8.9*   < > 6.2*  --  4.5*  --  4.3* 4.0* 2.7*  --    < > = values in this interval not displayed.    Estimated Creatinine Clearance: 81.3 mL/min (A) (by C-G formula based on SCr of 1.28 mg/dL (H)).    No Known Allergies  Antimicrobials this admission: 8/23 Azith/Rocephin x 1 8/23 Vanc >>  8/23 Cefepime >>   Microbiology results: 8/22 BCx: ngtd 8/22 Covid: positive  Thank you for allowing pharmacy to be a part of this patient's care.  Georgina Pillion, PharmD, BCPS Clinical Pharmacist Clinical phone for 08/08/2020: [phone] 08/08/2020 1:43 PM   **Pharmacist phone directory can now be found on [email or web address] (PW [email or web address]).  Listed under [facility].

## 2020-08-08 NOTE — ED Notes (Signed)
Wife was updated.

## 2020-08-08 NOTE — Consult Note (Signed)
NAME:  Henry May, MRN:  [id number], DOB:  1966-09-12, LOS: 2 ADMISSION DATE:  08/06/2020, CONSULTATION DATE:  08/08/20 REFERRING MD:  Toniann Fail, CHIEF COMPLAINT:  Dyspnea   Brief History   54 y/o M with no significant PMH who presented with increasing shortness of breath. He was diagnosed with Covid-19 on 8/12 and over the last few days his shortness of breath has progressively worsened. O2 sats were 55% on room air on EMS arrival. Chest x-ray with bilateral infiltrates and patient was high flow nasal cannula with EF. PCCM consulted for admission  History of present illness   Henry May 54 year old male with no significant past medical history, non-smoker,  who was diagnosed with Covid-19 on 8/12 along with his wife. He is scheduled to get the Covid-19 vaccine but has not received any doses yet. He reports cough with increasing chest pressure so called on EMS arrival his oxygen was reportedly 55% on room air. He initially improved on BiPAP, chest x-ray with bilateral infiltrates. Creatinine 1.9, glucose 170, bicarb 15, anion gap 25, lactic acid 8.9, WBC 21k, D-dimer >20, LDH 1,073, Ferritin 1,450, CRP 34.    He was initially placed on Bipap, then trial of HFNC. PCCM consulted for increasing WOB on 15L HFNC with non-rebreather.  Pt denies chest pain or recent lower extremity pain or edema  Past Medical History   has no past medical history on file.   Significant Hospital Events   8/22 admit to hospitalists 8/23 PCCM consult, ICU txfr  Consults:  PCCM  Procedures:    Significant Diagnostic Tests:  8/22 CXR>>Mild right lung infiltrates with mild to moderate severity left basilar infiltrate.  Micro Data:  8/22 Covid-19>>positive 8/22 BCx2>>  Antimicrobials:  Remdesevir 8/22- Azithromycin 8/22- Ceftriaxone 8/22-  Interim history/subjective:  Afebrile. Remained on BiPAP overnight.  Objective   Blood pressure (!) 143/107, pulse 86, temperature 99.5 F (37.5 C), temperature  source Oral, resp. rate (!) 27, height 6' (1.829 m), weight 106.6 kg, SpO2 (!) 89 %.    Vent Mode: PCV FiO2 (%):  [70 %-80 %] 70 % Set Rate:  [15 bmp] 15 bmp PEEP:  [8 cmH20] 8 cmH20 Plateau Pressure:  [14 cmH20-17 cmH20] 14 cmH20   Intake/Output Summary (Last 24 hours) at 08/08/2020 5329 Last data filed at 08/07/2020 1555 Gross per 24 hour  Intake 709.46 ml  Output --  Net 709.46 ml   Filed Weights   08/07/20 0549  Weight: 106.6 kg   Physical Exam: General: Chronically ill-appearing, no acute distress HENT: [place], AT, OP clear, MMM Eyes: EOMI, no scleral icterus Respiratory: Diminished breath sounds bilaterally.  No crackles, wheezing or rales Cardiovascular: RRR, -M/R/G, no JVD GI: BS+, soft, nontender Extremities:-Edema,-tenderness Neuro: AAO x4, CNII-XII grossly intact Skin: Intact, no rashes or bruising Psych: Normal mood, normal affect  Resolved Hospital Problem list     Assessment & Plan:   Acute hypoxemic respiratory failure secondary to COVID-19: High risk for intubation  - Transition to heated high flow and nonrebreather   - NIV as needed  - Target O2 saturations for goal SpO2 >85% at rest and ideally 75% with movement  - Monitor closely for signs of ventilatory failure such as nasal flaring, accessory muscle use, abdominal paradoxical movements for decision to intubate  - Self-proning while in bed  - Continue Remdesivir  - Continue Baricitinib  - Solu-Medrol 1 mg/kg   - Pulmonary hygiene including bronchodilator and flutter valve. AVOID manual percussion.  - Mobilize as tolerated  - Encourage  to obtain vaccine  Investigational COVID Therapies Based on ACTT-2 and COV-BARRIER trials and other available data, baricitinib is being used under EUA by the FDA. The patient has no ESRD or AKI, known history of TB, severe neutropenia (ANC <500) or lymphopenia (ALC <200), or severe LFT elevations. They are not on DMARDs or probenecid, and are not pregnant. The option to  use/refuse baricitinib treatment under FDA authorization (not approval), the significant known and potential risks and benefits, the extent to which these are unknown, and information regarding all available alternatives were discussed in detail. Specifically the risk of VTE and secondary infections were discussed in detail with the patient and/or HCPOA. They consent to proceed with treatment.  Sepsis secondary to above Lactic acidosis             - Continue broad spectrum abx: Vanc, Cefepime             - Trend lactic acid  Acute bilateral lower extremity DVTs             - Continue treatment dose lovenox             - Daily PT/INR  Acute Kidney Injury - improving  - Monitor UOP/Cr  Hyperglycemia  - SSI  Qtc prolongation >500  - Check mag level and repeat EKG in the AM, avoid Qtc prolonging medications  - Repeat EKG  Best practice:  Diet:  CLD Pain/Anxiety/Delirium protocol (if indicated): n/a VAP protocol (if indicated): n/a DVT prophylaxis: heparin GI prophylaxis: n/a Glucose control: SSI Mobility: bed rest Code Status: full code Family Communication: Updated patient at bedside Disposition: ICU  Labs   CBC: Recent Labs  Lab 08/06/20 1933 08/07/20 0319 08/07/20 0531 08/08/20 0626  WBC 21.3* 14.5*  --  19.9*  NEUTROABS 18.6*  --   --  18.2*  HGB 15.9 14.6 14.6 14.2  HCT 49.1 44.5 43.0 43.9  MCV 94.6 92.3  --  94.8  PLT 344 232  --  335    Basic Metabolic Panel: Recent Labs  Lab 08/06/20 1933 08/07/20 0117 08/07/20 0319 08/07/20 0531  NA 136  --  138 138  K 5.1  --  4.3 3.8  CL 96*  --  101  --   CO2 15*  --  20*  --   GLUCOSE 178*  --  182*  --   BUN 30*  --  35*  --   CREATININE 1.95* 1.72* 1.58*  --   CALCIUM 8.2*  --  7.8*  --   MG  --   --  2.8*  --    GFR: Estimated Creatinine Clearance: 65.9 mL/min (A) (by C-G formula based on SCr of 1.58 mg/dL (H)). Recent Labs  Lab 08/06/20 1933 08/06/20 2113 08/07/20 0120 08/07/20 0319 08/07/20 0351  08/07/20 0940 08/07/20 1244 08/08/20 0626  PROCALCITON 6.47  --   --   --   --   --   --   --   WBC 21.3*  --   --  14.5*  --   --   --  19.9*  LATICACIDVEN 8.9*   < > 4.5*  --  4.3* 4.0* 2.7*  --    < > = values in this interval not displayed.    Liver Function Tests: Recent Labs  Lab 08/06/20 1933  AST 96*  ALT 127*  ALKPHOS 104  BILITOT 1.7*  PROT 7.4  ALBUMIN 2.4*   No results for input(s): LIPASE, AMYLASE in the last 168  hours. No results for input(s): AMMONIA in the last 168 hours.  ABG    Component Value Date/Time   PHART 7.452 (H) 08/07/2020 0531   PCO2ART 31.8 (L) 08/07/2020 0531   PO2ART 83 08/07/2020 0531   HCO3 22.2 08/07/2020 0531   TCO2 23 08/07/2020 0531   ACIDBASEDEF 1.0 08/07/2020 0531   O2SAT 97.0 08/07/2020 0531     Coagulation Profile: Recent Labs  Lab 08/06/20 1933 08/07/20 1233  INR 1.4* 1.3*    Cardiac Enzymes: No results for input(s): CKTOTAL, CKMB, CKMBINDEX, TROPONINI in the last 168 hours.  HbA1C: Hgb A1c MFr Bld  Date/Time Value Ref Range Status  08/07/2020 05:15 AM 6.5 (H) 4.8 - 5.6 % Final    Comment:    (NOTE) Pre diabetes:          5.7%-6.4%  Diabetes:              >6.4%  Glycemic control for   <7.0% adults with diabetes     CBG: Recent Labs  Lab 08/07/20 1227 08/07/20 1651 08/07/20 2009 08/08/20 0119 08/08/20 0441  GLUCAP 141* 158* 137* 144* 157*   The patient is critically ill with multiple organ systems failure and requires high complexity decision making for assessment and support, frequent evaluation and titration of therapies, application of advanced monitoring technologies and extensive interpretation of multiple databases.  Independent Critical Care Time: 60 Minutes.   Mechele Collin, M.D. [facility] 08/08/2020 7:12 AM   Please see Amion for pager number to reach on-call Pulmonary and Critical Care Team.

## 2020-08-08 NOTE — TOC Initial Note (Signed)
Transition of Care [facility]) - Initial/Assessment Note    Patient Details  Name: Henry May MRN: [id number] Date of Birth: July 13, 1966  [facility] of [facility]) CM/SW Contact:    Lockie Pares, RN Phone Number: 08/08/2020, 10:58 AM  Clinical Narrative:                 Admitted with COVID ICU patient remains in ED due to Bed situation.  Planning on getting COVID vaccine but did not . Requiring BiPap, IV medications for treatment. Uninsured,CM  will be following to assist with needs. Will likely need home O2, medication assistance with MATCH and referral to PCP.   Expected Discharge Plan: Home w Home Health Services Barriers to Discharge: Continued Medical Work up   Patient Goals and CMS Choice        Expected Discharge Plan and Services Expected Discharge Plan: Home w Home Health Services                                              Prior Living Arrangements/Services   Lives with:: Spouse Patient language and need for interpreter reviewed:: Yes        Need for Family Participation in Patient Care: Yes (Comment) Care giver support system in place?: Yes (comment)   Criminal Activity/Legal Involvement Pertinent to Current Situation/Hospitalization: No - Comment as needed  Activities of Daily Living      Permission Sought/Granted      Share Information with NAME: Spouse           Emotional Assessment       Orientation: : Oriented to Situation, Oriented to  Time, Oriented to Place, Oriented to Self Alcohol / Substance Use: Not Applicable Psych Involvement: No (comment)  Admission diagnosis:  Acute respiratory failure due to COVID-19 (HCC) [U07.1, J96.00] Acute respiratory disease due to COVID-19 virus [U07.1, J06.9] Pneumonia due to COVID-19 virus [U07.1, J12.82] Patient Active Problem List   Diagnosis Date Noted  . ARF (acute renal failure) (HCC) 08/07/2020  . Elevated LFTs 08/07/2020  . Acute respiratory disease due to COVID-19 virus  08/07/2020  . Pneumonia due to COVID-19 virus 08/07/2020  . Acute respiratory failure due to COVID-19 [facility]) 08/06/2020   PCP:  Patient, No Pcp Per Pharmacy:   [facility] [phone] Ginette Otto, [place] - [address] [place] [address] Phone: [phone] Fax: [phone]     Social Determinants of Health (SDOH) Interventions    Readmission Risk Interventions No flowsheet data found.

## 2020-08-09 ENCOUNTER — Inpatient Hospital Stay (HOSPITAL_COMMUNITY): Payer: BC Managed Care – PPO

## 2020-08-09 DIAGNOSIS — J9600 Acute respiratory failure, unspecified whether with hypoxia or hypercapnia: Secondary | ICD-10-CM

## 2020-08-09 DIAGNOSIS — J1282 Pneumonia due to coronavirus disease 2019: Secondary | ICD-10-CM

## 2020-08-09 DIAGNOSIS — U071 COVID-19: Secondary | ICD-10-CM

## 2020-08-09 LAB — GLUCOSE, CAPILLARY
Glucose-Capillary: 137 mg/dL — ABNORMAL HIGH (ref 70–99)
Glucose-Capillary: 112 mg/dL — ABNORMAL HIGH (ref 70–99)
Glucose-Capillary: 111 mg/dL — ABNORMAL HIGH (ref 70–99)
Glucose-Capillary: 161 mg/dL — ABNORMAL HIGH (ref 70–99)
Glucose-Capillary: 151 mg/dL — ABNORMAL HIGH (ref 70–99)
Glucose-Capillary: 99 mg/dL (ref 70–99)

## 2020-08-09 LAB — CBC WITH DIFFERENTIAL/PLATELET
Abs Immature Granulocytes: 0.22 10*3/uL — ABNORMAL HIGH (ref 0.00–0.07)
Basophils Absolute: 0 10*3/uL (ref 0.0–0.1)
Basophils Relative: 0 %
Eosinophils Absolute: 0 10*3/uL (ref 0.0–0.5)
Eosinophils Relative: 0 %
HCT: 46.7 % (ref 39.0–52.0)
Hemoglobin: 15 g/dL (ref 13.0–17.0)
Immature Granulocytes: 1 %
Lymphocytes Relative: 4 %
Lymphs Abs: 0.6 10*3/uL — ABNORMAL LOW (ref 0.7–4.0)
MCH: 30.2 pg (ref 26.0–34.0)
MCHC: 32.1 g/dL (ref 30.0–36.0)
MCV: 94 fL (ref 80.0–100.0)
Monocytes Absolute: 0.3 10*3/uL (ref 0.1–1.0)
Monocytes Relative: 2 %
Neutro Abs: 15.9 10*3/uL — ABNORMAL HIGH (ref 1.7–7.7)
Neutrophils Relative %: 93 %
Platelets: 279 10*3/uL (ref 150–400)
RBC: 4.97 MIL/uL (ref 4.22–5.81)
RDW: 15 % (ref 11.5–15.5)
WBC: 17.1 10*3/uL — ABNORMAL HIGH (ref 4.0–10.5)
nRBC: 0.1 % (ref 0.0–0.2)

## 2020-08-09 LAB — COMPREHENSIVE METABOLIC PANEL
ALT: 71 U/L — ABNORMAL HIGH (ref 0–44)
AST: 60 U/L — ABNORMAL HIGH (ref 15–41)
Albumin: 2.4 g/dL — ABNORMAL LOW (ref 3.5–5.0)
Alkaline Phosphatase: 97 U/L (ref 38–126)
Anion gap: 12 (ref 5–15)
BUN: 48 mg/dL — ABNORMAL HIGH (ref 6–20)
CO2: 23 mmol/L (ref 22–32)
Calcium: 8.3 mg/dL — ABNORMAL LOW (ref 8.9–10.3)
Chloride: 111 mmol/L (ref 98–111)
Creatinine, Ser: 1.14 mg/dL (ref 0.61–1.24)
GFR calc Af Amer: >60 mL/min (ref >60)
GFR calc non Af Amer: >60 mL/min (ref >60)
Glucose, Bld: 118 mg/dL — ABNORMAL HIGH (ref 70–99)
Potassium: 4.7 mmol/L (ref 3.5–5.1)
Sodium: 146 mmol/L — ABNORMAL HIGH (ref 135–145)
Total Bilirubin: 0.7 mg/dL (ref 0.3–1.2)
Total Protein: 6.5 g/dL (ref 6.5–8.1)

## 2020-08-09 LAB — MAGNESIUM: Magnesium: 2.9 mg/dL — ABNORMAL HIGH (ref 1.7–2.4)

## 2020-08-09 LAB — CBG MONITORING, ED: Glucose-Capillary: 121 mg/dL — ABNORMAL HIGH (ref 70–99)

## 2020-08-09 LAB — C-REACTIVE PROTEIN: CRP: 10.4 mg/dL — ABNORMAL HIGH (ref <1.0)

## 2020-08-09 LAB — LACTIC ACID, PLASMA: Lactic Acid, Venous: 1.8 mmol/L (ref 0.5–1.9)

## 2020-08-09 LAB — PROCALCITONIN: Procalcitonin: 2.46 ng/mL

## 2020-08-09 LAB — MRSA PCR SCREENING: MRSA by PCR: NEGATIVE

## 2020-08-09 LAB — D-DIMER, QUANTITATIVE: D-Dimer, Quant: >20 ug{FEU}/mL — ABNORMAL HIGH (ref 0.00–0.50)

## 2020-08-09 LAB — STREP PNEUMONIAE URINARY ANTIGEN: Strep Pneumo Urinary Antigen: NEGATIVE

## 2020-08-09 LAB — PROTIME-INR
INR: 1.3 — ABNORMAL HIGH (ref 0.8–1.2)
Prothrombin Time: 15.6 seconds — ABNORMAL HIGH (ref 11.4–15.2)

## 2020-08-09 MED ORDER — TOCILIZUMAB 400 MG/20ML IV SOLN
760.0000 mg | Freq: Once | INTRAVENOUS | Status: DC
  Filled 2020-08-09: qty 38

## 2020-08-09 MED ORDER — BARICITINIB 2 MG PO TABS
4.0000 mg | ORAL_TABLET | Freq: Every day | ORAL | Status: AC
  Administered 2020-08-09 – 2020-08-22 (×14): 4 mg via ORAL
  Filled 2020-08-09 (×15): qty 2

## 2020-08-09 MED ORDER — CHLORHEXIDINE GLUCONATE CLOTH 2 % EX PADS
6.0000 | MEDICATED_PAD | Freq: Every day | CUTANEOUS | Status: DC
  Administered 2020-08-09 – 2020-09-04 (×22): 6 via TOPICAL

## 2020-08-09 MED ORDER — ENOXAPARIN SODIUM 100 MG/ML ~~LOC~~ SOLN
1.0000 mg/kg | Freq: Two times a day (BID) | SUBCUTANEOUS | Status: DC
  Administered 2020-08-09 – 2020-08-14 (×11): 97.5 mg via SUBCUTANEOUS
  Filled 2020-08-09 (×12): qty 0.97

## 2020-08-09 MED ORDER — WHITE PETROLATUM EX OINT
TOPICAL_OINTMENT | CUTANEOUS | Status: AC
  Administered 2020-08-09: 0.2
  Filled 2020-08-09: qty 28.35

## 2020-08-09 NOTE — Significant Event (Addendum)
Rapid Response Event Note   Reason for Call : Acute hypoxic respiratory failure  Initial Focused Assessment:  I was notified by Camelia Eng RRT of pt with acute hypoxic episode after transferring from the ED to 3E18. Pt was now requiring Salter 15L and NRB at 15L with sats 70s and RR 40s. Pt was alert, follows commands however is impulsive and uses poor judgement like getting OOB despite instruction not to do so.  Moderate WOB with accessory muscle use. Pt was able to self prone and instructed to stay prone at least til change of shift. BBS Coarse Rhonchi. Minimal cough. Pt was placed on HHFNC at 45L flow with 100% Fio2 and NRB 15L. Sats came up to 94% and RR 38-40. While I left the room to begin my note, he took all Oxygen off and sat up on the side of the bed because he said he needed to "pee". His oxygen sats fell to 28% with a correlated pleth on his pulse oximetry. He was immediately placed back on HHFNC/NRB in prone position. I informed him if he removes the oxygen again he would require resuscitation and possibly die. He is at high risk of intubation/respiratory arrest. I notified Elink of events and increased o2 need since transfer.   Addendum 5732: Dr. Rande Lawman at bedside. Hr 115 , 153/99, RR 40, Sats 79% on HHFNC at 45L at 100% Fio2 and NRB mask 15L. Will transfer to ICU for Bipap.     Interventions:  -HHFNC at 45L flow and 100% FiO2 -Prone positioning  Plan of Care:  -Prone positioning 6-8 hours at a time as tolerated -Minimize activity while high Fio2 need -Pt will likely need transfer to ICU care   MD Notified: [phone] Dr. Arsenio Loader with [facility] Call Time: 0345 Arrival Time: 0350 End Time: 0455  Rose Fillers, RN

## 2020-08-09 NOTE — Progress Notes (Signed)
Patient domiciled in ER for the last few days and transferred to floor bed earlier tonight.  Apparently got out of bed on his own after arrival here and removed his high flow oxygen to go to the bathroom.  He was found to have a sat of 28% on room air on return to bed.  On my arrival patient back on 45l 1000% and sats 77% in the prone position. Patient transferred to [facility].  Bipap started with sats in the mid 90s.  I also ordered an 800 mg dose of Actemra.  1 hour spent in bedside, transfer to ICU and critical care planning.

## 2020-08-09 NOTE — Progress Notes (Addendum)
NAME:  Henry May, MRN:  [id number], DOB:  [birth year], LOS: 3 ADMISSION DATE:  08/06/2020, CONSULTATION DATE:  08/08/20 REFERRING MD:  Toniann Fail, CHIEF COMPLAINT:  Dyspnea   Brief History   54 y/o M with no significant PMH who presented with increasing shortness of breath. He was diagnosed with Covid-19 on 8/12 and over the last few days his shortness of breath has progressively worsened. O2 sats were 55% on room air on EMS arrival. Chest x-ray with bilateral infiltrates and patient was high flow nasal cannula with EF. PCCM consulted for admission  History of present illness   Henry May 54 year old male with no significant past medical history, non-smoker,  who was diagnosed with Covid-19 on 8/12 along with his wife. He is scheduled to get the Covid-19 vaccine but has not received any doses yet. He reports cough with increasing chest pressure so called on EMS arrival his oxygen was reportedly 55% on room air. He initially improved on BiPAP, chest x-ray with bilateral infiltrates. Creatinine 1.9, glucose 170, bicarb 15, anion gap 25, lactic acid 8.9, WBC 21k, D-dimer >20, LDH 1,073, Ferritin 1,450, CRP 34.    He was initially placed on Bipap, then trial of HFNC. PCCM consulted for increasing WOB on 15L HFNC with non-rebreather.  Pt denies chest pain or recent lower extremity pain or edema  Past Medical History  No past medical history on file  Significant Hospital Events   8/22 admit to hospitalists 8/23 PCCM consult, ICU txfr  8/25 BiPAP and admitted to the ICU  Consults:  PCCM  Procedures:    Significant Diagnostic Tests:  8/22 CXR>>Mild right lung infiltrates with mild to moderate severity left basilar infiltrate.  8/23 ultrasound LE>> acute DVT bilaterally  8/23 echocardiogram>> EF 40 to 45% with regional wall motion abnormality of LV.  Grade 1 diastolic dysfunction.  Severe hypokinesis of LV basal mid inferior wall.  The RV size is mildly enlarged and RA size was moderately  dilated   Micro Data:  8/22 Covid-19>>positive 8/22 BCx2>> no growth to date MRSA negative  Antimicrobials:  Remdesevir 8/22- Azithromycin 8/22-8/23 Ceftriaxone 8/22-8/23 Vancomycin 8/23>> 8/25 Cefepime 8/23>>  Interim history/subjective:  Overnight: Patient was getting out of bed to use the bathroom and removed his high flow oxygen.  His O2 sat came down to 28%.  He was placed on 45 L and O2 sat was 77% on prone position.  He was put on BiPAP and transferred to the ICU.   Patient is seen at bedside today.  He is in acute respiratory distress but still satting well in the 90s on BiPAP.  He is tachypneic when talking.  He is very anxious.  He states that he is doing well on the BiPAP and does not want to be put back on high flow nasal cannula.    Patient states that he did not get the vaccines.  Objective   Blood pressure (!) 162/111, pulse 88, temperature 98.3 F (36.8 C), temperature source Axillary, resp. rate (!) 33, height 6' (1.829 m), weight 96.3 kg, SpO2 92 %.    Vent Mode: BIPAP FiO2 (%):  [100 %] 100 % PEEP:  [8 cmH20] 8 cmH20 Pressure Support:  [8 cmH20] 8 cmH20   Intake/Output Summary (Last 24 hours) at 08/09/2020 1141 Last data filed at 08/09/2020 1100 Gross per 24 hour  Intake 648.84 ml  Output 825 ml  Net -176.16 ml   Filed Weights   08/07/20 0549 08/09/20 0615  Weight: 106.6 kg 96.3 kg  Examination: Physical Exam Constitutional:      General: He is in acute distress (Acute respiratory distress).  HENT:     Head: Normocephalic.  Eyes:     General: No scleral icterus.       Right eye: No discharge.        Left eye: No discharge.     Conjunctiva/sclera: Conjunctivae normal.  Cardiovascular:     Rate and Rhythm: Normal rate and regular rhythm.     Heart sounds: No murmur heard.   Pulmonary:     Effort: Respiratory distress present.     Comments: Tachypneic Abdominal:     General: Bowel sounds are normal.     Palpations: Abdomen is soft.   Musculoskeletal:     Cervical back: Normal range of motion.     Right lower leg: No edema.     Left lower leg: No edema.  Skin:    General: Skin is warm.  Neurological:     Mental Status: He is alert.  Psychiatric:     Comments: Anxious     Resolved Hospital Problem list     Assessment & Plan:  Acute hypoxemic respiratory failure secondary to COVID-19 Sepsis Lactic acidosis Suspecting superimposed bacterial pneumonia Acute bilateral lower extremities DVTs Acute kidney injury QT prolongation  Plan:  Acute hypoxemic respiratory failure requiring BiPAP COVID-19 pneumonia Patient currently satting well on BiPAP.  The goal is to get him back on high flow nasal cannula.  Patient can self prone while in bed.  Low threshold for intubation if continue to have respiratory distress. -Continue BiPAP.  Will transition to high flow when tolerated -Self proning -Continue remdesivir and Solu-Medrol per pharmacy -Low threshold for intubation   Sepsis Lactic acidosis Suspecting superimposed bacterial pneumonia.  Pro-Cal of 6.47.  Blood culture show no growth up to date.  Check a sputum culture, strep pneumo and Legionella. Lactic 6.2-4.5-4.3-4-2.7 -Trending lactic level -Continue cefepime (day 3) -DC vancomycin given negative MRSA -Monitor fever curve and WBC   Acute bilateral lower extremities DVT Doppler ultrasound shows acute DVT of LE bilaterally.  No obvious sign of right heart strain on echocardiogram.  D-dimer> 20 -Continue therapeutic dose of Lovenox -Continue to monitor PT/INR -Monitor D-dimer   Acute kidney injury resolved Continue to monitor input and output. -BMP daily   QT prolongation  Current QTC of 370.  Continue to monitor.  Will check a Mag, replete as needed.  Best practice:  Diet: N.p.o. on BiPAP Pain/Anxiety/Delirium protocol (if indicated): N/A VAP protocol (if indicated): N/A DVT prophylaxis: Lovenox GI prophylaxis: N/A Glucose control:  Sliding scale Mobility: Bedrest Code Status: Full Family Communication: will update spouse Disposition: ICU  Critical care time: 30 min    Henry Stabler, DO Internal Medicine Residency My pager: [phone]    Pulmonary critical care attending:  This is a 54 year old gentleman no previous documented significant past medical history. Admitted with COVID-19 pneumonia.  Patient now on mechanical life support, possible superimposed bacterial pneumonia.  Elevated procalcitonin.  Started on antimicrobials. MRSA PCR negative, Vanco DC'd Continued on cefepime.  BP (!) 138/99   Pulse (!) 104   Temp 99.5 F (37.5 C) (Axillary)   Resp (!) 46   Ht 6' (1.829 m)   Wt 96.3 kg   SpO2 93%   BMI 28.79 kg/m   General: male intubated mechanical life support HEENT: NCAT, endotracheal tube in place Heart: Regular rhythm, S1-S2 Lungs: Bilateral mechanically ventilated breath sounds  Labs: Reviewed Chest x-ray: Multifocal bilateral pneumonia. The  patient's images have been independently reviewed by me.    A: Acute hypoxemic respiratory failure COVID-19 pneumonia BiPAP, doing well as needed this as needed Tolerating heated high flow now Sepsis lactic acidosis secondary to above. Acute bilateral DVT on Lovenox.  Plan: Self proning if possible Holding off any additional immune suppressants due to elevated pro-Cal Continue cefepime Continue full dose Lovenox  This patient is critically ill with multiple organ system failure; which, requires frequent high complexity decision making, assessment, support, evaluation, and titration of therapies. This was completed through the application of advanced monitoring technologies and extensive interpretation of multiple databases. During this encounter critical care time was devoted to patient care services described in this note for 33 minutes.  Henry Igo, DO [facility] 08/09/2020 7:08 PM

## 2020-08-09 NOTE — Progress Notes (Signed)
RT removed patient from bipap and placed on HFNC at 100% and 40L. Patient is tolerating well at this time. No complications. RT will continue to monitor.

## 2020-08-09 NOTE — Progress Notes (Signed)
eLink Physician-Brief Progress Note Patient Name: Henry May DOB: 10-Dec-1966 MRN: [id number]   Date of Service  08/09/2020  HPI/Events of Note  Hypoxia - Desaturated to 28. Now at 83%. Confused and pulling off O2. Intubation?  eICU Interventions  Will ask ground team to evaluate the patient at bedside.      Intervention Category Major Interventions: Hypoxemia - evaluation and management  [name] 08/09/2020, 4:59 AM

## 2020-08-09 NOTE — Progress Notes (Signed)
RT attempted to remove patient from BIPAP and placed on 15l salter. Patient 02 Sats dropped in the low 70's. RT placed patient back on bipap. Patient is tolerating well at this time. RT will continue to monitor.

## 2020-08-10 DIAGNOSIS — J069 Acute upper respiratory infection, unspecified: Secondary | ICD-10-CM

## 2020-08-10 LAB — CBC WITH DIFFERENTIAL/PLATELET
Abs Immature Granulocytes: 0.24 10*3/uL — ABNORMAL HIGH (ref 0.00–0.07)
Basophils Absolute: 0 10*3/uL (ref 0.0–0.1)
Basophils Relative: 0 %
Eosinophils Absolute: 0 10*3/uL (ref 0.0–0.5)
Eosinophils Relative: 0 %
HCT: 44.5 % (ref 39.0–52.0)
Hemoglobin: 14.2 g/dL (ref 13.0–17.0)
Immature Granulocytes: 2 %
Lymphocytes Relative: 4 %
Lymphs Abs: 0.5 10*3/uL — ABNORMAL LOW (ref 0.7–4.0)
MCH: 29.9 pg (ref 26.0–34.0)
MCHC: 31.9 g/dL (ref 30.0–36.0)
MCV: 93.7 fL (ref 80.0–100.0)
Monocytes Absolute: 0.2 10*3/uL (ref 0.1–1.0)
Monocytes Relative: 2 %
Neutro Abs: 12.5 10*3/uL — ABNORMAL HIGH (ref 1.7–7.7)
Neutrophils Relative %: 92 %
Platelets: 236 10*3/uL (ref 150–400)
RBC: 4.75 MIL/uL (ref 4.22–5.81)
RDW: 15 % (ref 11.5–15.5)
WBC: 13.5 10*3/uL — ABNORMAL HIGH (ref 4.0–10.5)
nRBC: 0 % (ref 0.0–0.2)

## 2020-08-10 LAB — GLUCOSE, CAPILLARY
Glucose-Capillary: 97 mg/dL (ref 70–99)
Glucose-Capillary: 116 mg/dL — ABNORMAL HIGH (ref 70–99)
Glucose-Capillary: 140 mg/dL — ABNORMAL HIGH (ref 70–99)
Glucose-Capillary: 160 mg/dL — ABNORMAL HIGH (ref 70–99)
Glucose-Capillary: 151 mg/dL — ABNORMAL HIGH (ref 70–99)
Glucose-Capillary: 102 mg/dL — ABNORMAL HIGH (ref 70–99)

## 2020-08-10 LAB — COMPREHENSIVE METABOLIC PANEL
ALT: 62 U/L — ABNORMAL HIGH (ref 0–44)
AST: 60 U/L — ABNORMAL HIGH (ref 15–41)
Albumin: 2.2 g/dL — ABNORMAL LOW (ref 3.5–5.0)
Alkaline Phosphatase: 98 U/L (ref 38–126)
Anion gap: 11 (ref 5–15)
BUN: 38 mg/dL — ABNORMAL HIGH (ref 6–20)
CO2: 21 mmol/L — ABNORMAL LOW (ref 22–32)
Calcium: 8.1 mg/dL — ABNORMAL LOW (ref 8.9–10.3)
Chloride: 110 mmol/L (ref 98–111)
Creatinine, Ser: 1.03 mg/dL (ref 0.61–1.24)
GFR calc Af Amer: >60 mL/min (ref >60)
GFR calc non Af Amer: >60 mL/min (ref >60)
Glucose, Bld: 120 mg/dL — ABNORMAL HIGH (ref 70–99)
Potassium: 4.8 mmol/L (ref 3.5–5.1)
Sodium: 142 mmol/L (ref 135–145)
Total Bilirubin: 1.1 mg/dL (ref 0.3–1.2)
Total Protein: 6.4 g/dL — ABNORMAL LOW (ref 6.5–8.1)

## 2020-08-10 LAB — D-DIMER, QUANTITATIVE: D-Dimer, Quant: >20 ug/mL-FEU — ABNORMAL HIGH (ref 0.00–0.50)

## 2020-08-10 LAB — PROTIME-INR
INR: 1.4 — ABNORMAL HIGH (ref 0.8–1.2)
Prothrombin Time: 16.8 seconds — ABNORMAL HIGH (ref 11.4–15.2)

## 2020-08-10 LAB — PROCALCITONIN: Procalcitonin: 0.96 ng/mL

## 2020-08-10 LAB — LEGIONELLA PNEUMOPHILA SEROGP 1 UR AG: L. pneumophila Serogp 1 Ur Ag: NEGATIVE

## 2020-08-10 LAB — C-REACTIVE PROTEIN: CRP: 14.8 mg/dL — ABNORMAL HIGH (ref <1.0)

## 2020-08-10 MED ORDER — METHYLPREDNISOLONE SODIUM SUCC 40 MG IJ SOLR
25.0000 mg | Freq: Every day | INTRAMUSCULAR | Status: AC
  Administered 2020-08-14 – 2020-08-16 (×3): 25 mg via INTRAVENOUS
  Filled 2020-08-10 (×3): qty 1

## 2020-08-10 MED ORDER — METHYLPREDNISOLONE SODIUM SUCC 40 MG IJ SOLR
25.0000 mg | Freq: Two times a day (BID) | INTRAMUSCULAR | Status: AC
  Administered 2020-08-11 – 2020-08-13 (×6): 25 mg via INTRAVENOUS
  Filled 2020-08-10 (×6): qty 1

## 2020-08-10 MED ORDER — AMLODIPINE BESYLATE 5 MG PO TABS
5.0000 mg | ORAL_TABLET | Freq: Every day | ORAL | Status: DC
  Administered 2020-08-10 – 2020-08-22 (×13): 5 mg via ORAL
  Filled 2020-08-10 (×13): qty 1

## 2020-08-10 MED ORDER — LABETALOL HCL 5 MG/ML IV SOLN: 5.0000 mg | INTRAVENOUS | Status: DC | PRN

## 2020-08-10 NOTE — Progress Notes (Signed)
Pt sats dropped down to 84% while using the urinal, nonrebreather placed over HFNC, respiratory made aware. Will continue to monitor.

## 2020-08-10 NOTE — Progress Notes (Addendum)
NAME:  Henry May, MRN:  [id number], DOB:  December 18, 1965, LOS: 4 ADMISSION DATE:  08/06/2020, CONSULTATION DATE:08/08/20 REFERRING MD: Toniann Fail, CHIEF COMPLAINT:Dyspnea  Brief History   54 y/o M with no significant PMH who presented with increasing shortness of breath. He was diagnosed with Covid-19 on 8/12 and over the last few days his shortness of breath has progressively worsened. O2 sats were 55% on room air on EMS arrival. Chest x-ray with bilateral infiltrates and patient was high flow nasal cannula with EF. PCCM consulted for admission  History of present illness   Henry May 54 year old male with no significant past medical history, non-smoker, who was diagnosed with Covid-19 on 8/12 along with his wife. He is scheduled to get the Covid-19 vaccine but has not received any doses yet. He reports cough with increasing chest pressure so called on EMS arrival his oxygen was reportedly 55% on room air. He initially improved on BiPAP, chest x-ray with bilateral infiltrates. Creatinine 1.9, glucose 170, bicarb 15, anion gap 25, lactic acid 8.9, WBC 21k, D-dimer >20, LDH 1,073, Ferritin 1,450, CRP 34.   He was initially placed on Bipap, then trial of HFNC. PCCM consulted for increasing WOB on 15L HFNC with non-rebreather. Pt denies chest pain or recent lower extremity pain or edema  Past Medical History  No past medical history on file  Significant Hospital Events   8/22 admit to hospitalists 8/23 PCCM consult, ICU txfr  8/25 BiPAP and admitted to the ICU  Consults:  PCCM  Procedures:    Significant Diagnostic Tests:  8/22 CXR>>Mild right lung infiltrates with mild to moderate severity left basilar infiltrate.  8/23 ultrasound LE>> acute DVT bilaterally  8/23 echocardiogram>> EF 40 to 45% with regional wall motion abnormality of LV.  Grade 1 diastolic dysfunction.  Severe hypokinesis of LV basal mid inferior wall.  The RV size is mildly enlarged and RA size was  moderately dilated   Micro Data:  8/22 Covid-19>>positive 8/22 BCx2>> no growth to date MRSA negative  Antimicrobials:  Remdesevir 8/22- Azithromycin 8/22-8/23 Ceftriaxone 8/22-8/23 Vancomycin 8/23>> 8/25 Cefepime 8/23>>   Interim history/subjective:  No sig change overnight  Feels a bit better. Using IS Does not want to attempt OOB to chair    Objective   Blood pressure (!) 143/98, pulse 82, temperature 98.6 F (37 C), temperature source Axillary, resp. rate (!) 40, height 6' (1.829 m), weight 96.3 kg, SpO2 90 %.    Vent Mode: Stand-by FiO2 (%):  [100 %] 100 %   Intake/Output Summary (Last 24 hours) at 08/10/2020 1040 Last data filed at 08/10/2020 0900 Gross per 24 hour  Intake 1010.5 ml  Output 1725 ml  Net -714.5 ml   Filed Weights   08/07/20 0549 08/09/20 0615 08/10/20 0500  Weight: 106.6 kg 96.3 kg 96.3 kg    Examination: General: wdwn male, NAD on HFNC using IS HENT: mm moist, no JVD  Lungs: resps even non labored although tachypneic on HFNC  Cardiovascular: s1s2 RRR Abdomen: round, soft, non tender tol PO diet  Extremities: warm and dry no edema  Neuro: awake, alert, appropriate, MAE   Resolved Hospital Problem list     Assessment & Plan:  Acute hypoxemic respiratory failure secondary to COVID-19 Sepsis Lactic acidosis Suspecting superimposed bacterial pneumonia Acute bilateral lower extremities DVTs Acute kidney injury QT prolongation  Plan:  Acute hypoxemic respiratory failure requiring BiPAP COVID-19 pneumonia Patient currently satting well on BiPAP.  The goal is to get him back on high flow nasal  cannula.  Patient can self prone while in bed.  Low threshold for intubation if continue to have respiratory distress. -Continue HFNC - wean as tol  - pulmonary hygiene, mobilize as able  -Self proning -Continue remdesivir and Solu-Medrol per pharmacy -f/u CXR   Sepsis Lactic acidosis ?superimposed bacterial pneumonia.  Blood culture  show no growth up to date.  Check a sputum culture, strep pneumo and Legionella. PLAN -  Continue cefepime  Trend lactate  Monitor fever curve, WBC    Acute bilateral lower extremities DVT  D-dimer> 20 PLAN -  Continue lovenox per pharmacy    QT prolongation  Current QTC of 370.  Continue to monitor.  Will check a Mag, replete as needed.  Best practice:  Diet: clear liquids  Pain/Anxiety/Delirium protocol (if indicated): N/A VAP protocol (if indicated): N/A DVT prophylaxis: Lovenox GI prophylaxis: N/A Glucose control: Sliding scale Mobility: Bedrest Code Status: Full Family Communication: will update spouse Disposition: ICU  Labs   CBC: Recent Labs  Lab 08/06/20 1933 08/06/20 1933 08/07/20 0319 08/07/20 0531 08/08/20 0626 08/09/20 0845 08/10/20 0612  WBC 21.3*  --  14.5*  --  19.9* 17.1* 13.5*  NEUTROABS 18.6*  --   --   --  18.2* 15.9* 12.5*  HGB 15.9   < > 14.6 14.6 14.2 15.0 14.2  HCT 49.1   < > 44.5 43.0 43.9 46.7 44.5  MCV 94.6  --  92.3  --  94.8 94.0 93.7  PLT 344  --  232  --  335 279 236   < > = values in this interval not displayed.    Basic Metabolic Panel: Recent Labs  Lab 08/06/20 1933 08/06/20 1933 08/07/20 0117 08/07/20 0319 08/07/20 0531 08/08/20 0626 08/09/20 0845 08/09/20 1357 08/10/20 0612  NA 136   < >  --  138 138 142 146*  --  142  K 5.1   < >  --  4.3 3.8 3.9 4.7  --  4.8  CL 96*  --   --  101  --  107 111  --  110  CO2 15*  --   --  20*  --  21* 23  --  21*  GLUCOSE 178*  --   --  182*  --  156* 118*  --  120*  BUN 30*  --   --  35*  --  50* 48*  --  38*  CREATININE 1.95*   < > 1.72* 1.58*  --  1.28* 1.14  --  1.03  CALCIUM 8.2*  --   --  7.8*  --  8.3* 8.3*  --  8.1*  MG  --   --   --  2.8*  --   --   --  2.9*  --    < > = values in this interval not displayed.   GFR: Estimated Creatinine Clearance: 96.4 mL/min (by C-G formula based on SCr of 1.03 mg/dL). Recent Labs  Lab 08/06/20 1933 08/06/20 2113 08/07/20 0120  08/07/20 0319 08/07/20 0351 08/07/20 0940 08/07/20 1244 08/08/20 0626 08/09/20 0845 08/09/20 1357 08/10/20 0612  PROCALCITON 6.47  --   --   --   --   --   --   --  2.46  --  0.96  WBC 21.3*   < >  --  14.5*  --   --   --  19.9* 17.1*  --  13.5*  LATICACIDVEN 8.9*   < >   < >  --  4.3* 4.0* 2.7*  --   --  1.8  --    < > = values in this interval not displayed.    Liver Function Tests: Recent Labs  Lab 08/06/20 1933 08/08/20 0626 08/09/20 0845 08/10/20 0612  AST 96* 55* 60* 60*  ALT 127* 78* 71* 62*  ALKPHOS 104 89 97 98  BILITOT 1.7* 0.8 0.7 1.1  PROT 7.4 6.3* 6.5 6.4*  ALBUMIN 2.4* 2.2* 2.4* 2.2*   No results for input(s): LIPASE, AMYLASE in the last 168 hours. No results for input(s): AMMONIA in the last 168 hours.  ABG    Component Value Date/Time   PHART 7.452 (H) 08/07/2020 0531   PCO2ART 31.8 (L) 08/07/2020 0531   PO2ART 83 08/07/2020 0531   HCO3 22.2 08/07/2020 0531   TCO2 23 08/07/2020 0531   ACIDBASEDEF 1.0 08/07/2020 0531   O2SAT 97.0 08/07/2020 0531     Coagulation Profile: Recent Labs  Lab 08/06/20 1933 08/07/20 1233 08/08/20 0626 08/09/20 0845 08/10/20 0612  INR 1.4* 1.3* 1.3* 1.3* 1.4*    Cardiac Enzymes: No results for input(s): CKTOTAL, CKMB, CKMBINDEX, TROPONINI in the last 168 hours.  HbA1C: Hgb A1c MFr Bld  Date/Time Value Ref Range Status  08/07/2020 05:15 AM 6.5 (H) 4.8 - 5.6 % Final    Comment:    (NOTE) Pre diabetes:          5.7%-6.4%  Diabetes:              >6.4%  Glycemic control for   <7.0% adults with diabetes     CBG: Recent Labs  Lab 08/09/20 1556 08/09/20 1924 08/09/20 2322 08/10/20 0318 08/10/20 0722  GLUCAP 161* 151* 99 97 116*     Critical care time:      Dirk Dress, NP Pulmonary/Critical Care Medicine  08/10/2020  10:40 AM  Pulmonary critical care attending:  This is a 54 year old admitted for COVID-19 pneumonia.  Has not required intubation stable on BiPAP initially.   Transition to heated high flow nasal cannula.  O2 sats have remained stable.  BP (!) 154/100 (BP Location: Left Arm)   Pulse 89   Temp 97.8 F (36.6 C) (Oral)   Resp (!) 42   Ht 6' (1.829 m)   Wt 96.3 kg   SpO2 91%   BMI 28.79 kg/m   General: Male resting comfortably in bed HEENT: NCAT, tracking appropriately Heart: Regular rhythm S1-S2 Lungs: No crackles no wheeze  Labs: Reviewed X-ray: Bilateral multifocal pneumonia.The patient's images have been independently reviewed by me.    A: Acute hypoxemic respiratory failure COVID-19 bilateral ammonia Heated high flow nasal cannula tolerating well Sepsis lactic acidosis secondary above Acute bilateral DVT on Lovenox  Plan: Continue self proning if possible Continue cefepime Continue full dose Lovenox Continue COVID-19 treatments to include remdesivir plus steroids plus barcitinib.  This patient is critically ill with multiple organ system failure; which, requires frequent high complexity decision making, assessment, support, evaluation, and titration of therapies. This was completed through the application of advanced monitoring technologies and extensive interpretation of multiple databases. During this encounter critical care time was devoted to patient care services described in this note for 33 minutes.   Josephine Igo, DO [facility] 08/10/2020 5:52 PM

## 2020-08-11 LAB — COMPREHENSIVE METABOLIC PANEL
ALT: 65 U/L — ABNORMAL HIGH (ref 0–44)
AST: 52 U/L — ABNORMAL HIGH (ref 15–41)
Albumin: 2.4 g/dL — ABNORMAL LOW (ref 3.5–5.0)
Alkaline Phosphatase: 104 U/L (ref 38–126)
Anion gap: 12 (ref 5–15)
BUN: 36 mg/dL — ABNORMAL HIGH (ref 6–20)
CO2: 20 mmol/L — ABNORMAL LOW (ref 22–32)
Calcium: 8.5 mg/dL — ABNORMAL LOW (ref 8.9–10.3)
Chloride: 104 mmol/L (ref 98–111)
Creatinine, Ser: 1.02 mg/dL (ref 0.61–1.24)
GFR calc Af Amer: >60 mL/min (ref >60)
GFR calc non Af Amer: >60 mL/min (ref >60)
Glucose, Bld: 122 mg/dL — ABNORMAL HIGH (ref 70–99)
Potassium: 4.7 mmol/L (ref 3.5–5.1)
Sodium: 136 mmol/L (ref 135–145)
Total Bilirubin: 0.8 mg/dL (ref 0.3–1.2)
Total Protein: 6.8 g/dL (ref 6.5–8.1)

## 2020-08-11 LAB — POCT I-STAT 7, (LYTES, BLD GAS, ICA,H+H)
Acid-base deficit: 2 mmol/L (ref 0.0–2.0)
Bicarbonate: 21.5 mmol/L (ref 20.0–28.0)
Calcium, Ion: 1.18 mmol/L (ref 1.15–1.40)
HCT: 43 % (ref 39.0–52.0)
Hemoglobin: 14.6 g/dL (ref 13.0–17.0)
O2 Saturation: 93 %
Patient temperature: 98.6
Potassium: 4.8 mmol/L (ref 3.5–5.1)
Sodium: 139 mmol/L (ref 135–145)
TCO2: 23 mmol/L (ref 22–32)
pCO2 arterial: 34.3 mmHg (ref 32.0–48.0)
pH, Arterial: 7.406 (ref 7.350–7.450)
pO2, Arterial: 65 mmHg — ABNORMAL LOW (ref 83.0–108.0)

## 2020-08-11 LAB — CULTURE, BLOOD (ROUTINE X 2): Culture: NO GROWTH

## 2020-08-11 LAB — CBC WITH DIFFERENTIAL/PLATELET
Abs Immature Granulocytes: 0.42 10*3/uL — ABNORMAL HIGH (ref 0.00–0.07)
Basophils Absolute: 0.1 10*3/uL (ref 0.0–0.1)
Basophils Relative: 0 %
Eosinophils Absolute: 0 10*3/uL (ref 0.0–0.5)
Eosinophils Relative: 0 %
HCT: 47.8 % (ref 39.0–52.0)
Hemoglobin: 15.8 g/dL (ref 13.0–17.0)
Immature Granulocytes: 3 %
Lymphocytes Relative: 5 %
Lymphs Abs: 0.9 10*3/uL (ref 0.7–4.0)
MCH: 30.9 pg (ref 26.0–34.0)
MCHC: 33.1 g/dL (ref 30.0–36.0)
MCV: 93.4 fL (ref 80.0–100.0)
Monocytes Absolute: 0.3 10*3/uL (ref 0.1–1.0)
Monocytes Relative: 2 %
Neutro Abs: 14.1 10*3/uL — ABNORMAL HIGH (ref 1.7–7.7)
Neutrophils Relative %: 90 %
Platelets: 291 10*3/uL (ref 150–400)
RBC: 5.12 MIL/uL (ref 4.22–5.81)
RDW: 14.8 % (ref 11.5–15.5)
WBC: 15.8 10*3/uL — ABNORMAL HIGH (ref 4.0–10.5)
nRBC: 0 % (ref 0.0–0.2)

## 2020-08-11 LAB — GLUCOSE, CAPILLARY
Glucose-Capillary: 118 mg/dL — ABNORMAL HIGH (ref 70–99)
Glucose-Capillary: 124 mg/dL — ABNORMAL HIGH (ref 70–99)
Glucose-Capillary: 128 mg/dL — ABNORMAL HIGH (ref 70–99)
Glucose-Capillary: 163 mg/dL — ABNORMAL HIGH (ref 70–99)
Glucose-Capillary: 112 mg/dL — ABNORMAL HIGH (ref 70–99)
Glucose-Capillary: 124 mg/dL — ABNORMAL HIGH (ref 70–99)

## 2020-08-11 LAB — PROTIME-INR
INR: 1.3 — ABNORMAL HIGH (ref 0.8–1.2)
Prothrombin Time: 15.3 seconds — ABNORMAL HIGH (ref 11.4–15.2)

## 2020-08-11 LAB — D-DIMER, QUANTITATIVE: D-Dimer, Quant: 13.9 ug/mL-FEU — ABNORMAL HIGH (ref 0.00–0.50)

## 2020-08-11 LAB — C-REACTIVE PROTEIN: CRP: 7.9 mg/dL — ABNORMAL HIGH (ref <1.0)

## 2020-08-11 LAB — PROCALCITONIN: Procalcitonin: 0.38 ng/mL

## 2020-08-11 MED ORDER — ENSURE ENLIVE PO LIQD
237.0000 mL | Freq: Two times a day (BID) | ORAL | Status: DC
  Administered 2020-08-11 – 2020-08-16 (×9): 237 mL via ORAL

## 2020-08-11 MED ORDER — OXYMETAZOLINE HCL 0.05 % NA SOLN
1.0000 | Freq: Two times a day (BID) | NASAL | Status: DC
  Administered 2020-08-11 – 2020-08-13 (×3): 1 via NASAL
  Filled 2020-08-11: qty 30

## 2020-08-11 MED ORDER — SALINE SPRAY 0.65 % NA SOLN
1.0000 | NASAL | Status: DC | PRN
  Administered 2020-08-11 – 2020-08-25 (×2): 1 via NASAL
  Filled 2020-08-11 (×2): qty 44

## 2020-08-11 MED ORDER — POLYETHYLENE GLYCOL 3350 17 G PO PACK
17.0000 g | PACK | Freq: Every day | ORAL | Status: DC
  Administered 2020-08-11 – 2020-08-22 (×9): 17 g via ORAL
  Filled 2020-08-11 (×12): qty 1

## 2020-08-11 NOTE — Progress Notes (Signed)
NAME:  Henry May, MRN:  [id number], DOB:  01/26/1966, LOS: 5 ADMISSION DATE:  08/06/2020, CONSULTATION DATE:08/08/20 REFERRING MD: Toniann Fail, CHIEF COMPLAINT:Dyspnea  Brief History   54 y/o M with no significant PMH who presented with increasing shortness of breath. He was diagnosed with Covid-19 on 8/12 and over the last few days his shortness of breath has progressively worsened. O2 sats were 55% on room air on EMS arrival. Chest x-ray with bilateral infiltrates and patient was high flow nasal cannula with EF. PCCM consulted for admission  History of present illness   Henry May 54 year old male with no significant past medical history, non-smoker, who was diagnosed with Covid-19 on 8/12 along with his wife. He is scheduled to get the Covid-19 vaccine but has not received any doses yet. He reports cough with increasing chest pressure so called on EMS arrival his oxygen was reportedly 55% on room air. He initially improved on BiPAP, chest x-ray with bilateral infiltrates. Creatinine 1.9, glucose 170, bicarb 15, anion gap 25, lactic acid 8.9, WBC 21k, D-dimer >20, LDH 1,073, Ferritin 1,450, CRP 34.   He was initially placed on Bipap, then trial of HFNC. PCCM consulted for increasing WOB on 15L HFNC with non-rebreather. Pt denies chest pain or recent lower extremity pain or edema  Past Medical History  No past medical history on file  Significant Hospital Events   8/22 admit to hospitalists 8/23 PCCM consult, ICU txfr  8/25 BiPAP and admitted to the ICU  Consults:  PCCM  Procedures:    Significant Diagnostic Tests:  8/22 CXR>>Mild right lung infiltrates with mild to moderate severity left basilar infiltrate.  8/23 ultrasound LE>> acute DVT bilaterally  8/23 echocardiogram>> EF 40 to 45% with regional wall motion abnormality of LV.  Grade 1 diastolic dysfunction.  Severe hypokinesis of LV basal mid inferior wall.  The RV size is mildly enlarged and RA size was  moderately dilated   Micro Data:  8/22 Covid-19>>positive 8/22 BCx2>> no growth to date MRSA negative  Antimicrobials:  Remdesevir 8/22- Azithromycin 8/22-8/23 Ceftriaxone 8/22-8/23 Vancomycin 8/23>> 8/25 Cefepime 8/23>>   Interim history/subjective:   Remains critically ill. On 100% and 40L HHFN, sats low 90s when resting. Patient does state that he clinically feels better today. Able to sit up on side of bed this morning for a few moments   Objective   Blood pressure (!) 152/83, pulse 96, temperature 98.5 F (36.9 C), temperature source Axillary, resp. rate (!) 39, height 6' (1.829 m), weight 99.1 kg, SpO2 95 %.    Vent Mode: Stand-by FiO2 (%):  [100 %] 100 %   Intake/Output Summary (Last 24 hours) at 08/11/2020 1155 Last data filed at 08/11/2020 1143 Gross per 24 hour  Intake 759.74 ml  Output 2600 ml  Net -1840.26 ml   Filed Weights   08/09/20 0615 08/10/20 0500 08/11/20 0500  Weight: 96.3 kg 96.3 kg 99.1 kg    Examination: General: male, setting on bed, comfortable  HENT: MM dry, no jvd  Lungs: no crackles, no wheeze  Cardiovascular: RRR, s1 s2  Abdomen: soft nt nd  Extremities: no edema  Neuro: alert, oriented, following commands    Resolved Hospital Problem list     Assessment & Plan:   Acute hypoxemic respiratory failure secondary to COVID-19 Sepsis Lactic acidosis Suspecting superimposed bacterial pneumonia Acute bilateral lower extremities DVTs Acute kidney injury QT prolongation  Plan:  Acute hypoxemic respiratory failure requiring BiPAP COVID-19 pneumonia - pulmonary toilet - IS, flutter  - PT/OT  -  continue remdesivir + steroids and barcitinib   Sepsis, related to above Lactic acidosis PLAN -  Continue cefepime   Acute bilateral lower extremities DVT  D-dimer> 20 PLAN -  Lovenox   QT prolongation  Monitor Avoid qt prolonging drugs   Best practice:  Diet: clear liquids  Pain/Anxiety/Delirium protocol (if indicated):  N/A VAP protocol (if indicated): N/A DVT prophylaxis: Lovenox GI prophylaxis: N/A Glucose control: Sliding scale Mobility: Bedrest Code Status: Full Family Communication: I attempted to call number in chart, no answer.  Disposition: ICU  Labs   CBC: Recent Labs  Lab 08/06/20 1933 08/06/20 1933 08/07/20 0319 08/07/20 0531 08/08/20 8295 08/09/20 0845 08/10/20 0612 08/11/20 0428 08/11/20 0926  WBC 21.3*   < > 14.5*  --  19.9* 17.1* 13.5*  --  15.8*  NEUTROABS 18.6*  --   --   --  18.2* 15.9* 12.5*  --  14.1*  HGB 15.9   < > 14.6   < > 14.2 15.0 14.2 14.6 15.8  HCT 49.1   < > 44.5   < > 43.9 46.7 44.5 43.0 47.8  MCV 94.6   < > 92.3  --  94.8 94.0 93.7  --  93.4  PLT 344   < > 232  --  335 279 236  --  291   < > = values in this interval not displayed.    Basic Metabolic Panel: Recent Labs  Lab 08/07/20 0319 08/07/20 0531 08/08/20 0626 08/09/20 0845 08/09/20 1357 08/10/20 0612 08/11/20 0428 08/11/20 0926  NA 138   < > 142 146*  --  142 139 136  K 4.3   < > 3.9 4.7  --  4.8 4.8 4.7  CL 101  --  107 111  --  110  --  104  CO2 20*  --  21* 23  --  21*  --  20*  GLUCOSE 182*  --  156* 118*  --  120*  --  122*  BUN 35*  --  50* 48*  --  38*  --  36*  CREATININE 1.58*  --  1.28* 1.14  --  1.03  --  1.02  CALCIUM 7.8*  --  8.3* 8.3*  --  8.1*  --  8.5*  MG 2.8*  --   --   --  2.9*  --   --   --    < > = values in this interval not displayed.   GFR: Estimated Creatinine Clearance: 98.6 mL/min (by C-G formula based on SCr of 1.02 mg/dL). Recent Labs  Lab 08/06/20 1933 08/06/20 2113 08/07/20 0319 08/07/20 0351 08/07/20 0940 08/07/20 1244 08/08/20 0626 08/09/20 0845 08/09/20 1357 08/10/20 0612 08/11/20 0926  PROCALCITON 6.47  --   --   --   --   --   --  2.46  --  0.96 0.38  WBC 21.3*   < >   < >  --   --   --  19.9* 17.1*  --  13.5* 15.8*  LATICACIDVEN 8.9*   < >  --  4.3* 4.0* 2.7*  --   --  1.8  --   --    < > = values in this interval not displayed.     Liver Function Tests: Recent Labs  Lab 08/06/20 1933 08/08/20 0626 08/09/20 0845 08/10/20 0612 08/11/20 0926  AST 96* 55* 60* 60* 52*  ALT 127* 78* 71* 62* 65*  ALKPHOS 104 89 97 98 104  BILITOT  1.7* 0.8 0.7 1.1 0.8  PROT 7.4 6.3* 6.5 6.4* 6.8  ALBUMIN 2.4* 2.2* 2.4* 2.2* 2.4*   No results for input(s): LIPASE, AMYLASE in the last 168 hours. No results for input(s): AMMONIA in the last 168 hours.  ABG    Component Value Date/Time   PHART 7.406 08/11/2020 0428   PCO2ART 34.3 08/11/2020 0428   PO2ART 65 (L) 08/11/2020 0428   HCO3 21.5 08/11/2020 0428   TCO2 23 08/11/2020 0428   ACIDBASEDEF 2.0 08/11/2020 0428   O2SAT 93.0 08/11/2020 0428     Coagulation Profile: Recent Labs  Lab 08/07/20 1233 08/08/20 0626 08/09/20 0845 08/10/20 0612 08/11/20 0926  INR 1.3* 1.3* 1.3* 1.4* 1.3*    Cardiac Enzymes: No results for input(s): CKTOTAL, CKMB, CKMBINDEX, TROPONINI in the last 168 hours.  HbA1C: Hgb A1c MFr Bld  Date/Time Value Ref Range Status  08/07/2020 05:15 AM 6.5 (H) 4.8 - 5.6 % Final    Comment:    (NOTE) Pre diabetes:          5.7%-6.4%  Diabetes:              >6.4%  Glycemic control for   <7.0% adults with diabetes     CBG: Recent Labs  Lab 08/10/20 1926 08/10/20 2317 08/11/20 0420 08/11/20 0739 08/11/20 1146  GLUCAP 151* 102* 118* 124* 128*    This patient is critically ill with multiple organ system failure; which, requires frequent high complexity decision making, assessment, support, evaluation, and titration of therapies. This was completed through the application of advanced monitoring technologies and extensive interpretation of multiple databases. During this encounter critical care time was devoted to patient care services described in this note for 32 minutes.  Josephine Igo, DO [facility] 08/11/2020 12:07 PM

## 2020-08-12 ENCOUNTER — Inpatient Hospital Stay (HOSPITAL_COMMUNITY): Payer: BC Managed Care – PPO

## 2020-08-12 LAB — COMPREHENSIVE METABOLIC PANEL
ALT: 69 U/L — ABNORMAL HIGH (ref 0–44)
AST: 45 U/L — ABNORMAL HIGH (ref 15–41)
Albumin: 2.5 g/dL — ABNORMAL LOW (ref 3.5–5.0)
Alkaline Phosphatase: 109 U/L (ref 38–126)
Anion gap: 12 (ref 5–15)
BUN: 35 mg/dL — ABNORMAL HIGH (ref 6–20)
CO2: 21 mmol/L — ABNORMAL LOW (ref 22–32)
Calcium: 8.6 mg/dL — ABNORMAL LOW (ref 8.9–10.3)
Chloride: 103 mmol/L (ref 98–111)
Creatinine, Ser: 1.06 mg/dL (ref 0.61–1.24)
GFR calc Af Amer: >60 mL/min (ref >60)
GFR calc non Af Amer: >60 mL/min (ref >60)
Glucose, Bld: 116 mg/dL — ABNORMAL HIGH (ref 70–99)
Potassium: 5.2 mmol/L — ABNORMAL HIGH (ref 3.5–5.1)
Sodium: 136 mmol/L (ref 135–145)
Total Bilirubin: 0.7 mg/dL (ref 0.3–1.2)
Total Protein: 7.1 g/dL (ref 6.5–8.1)

## 2020-08-12 LAB — CBC WITH DIFFERENTIAL/PLATELET
Abs Immature Granulocytes: 0.6 10*3/uL — ABNORMAL HIGH (ref 0.00–0.07)
Basophils Absolute: 0.1 10*3/uL (ref 0.0–0.1)
Basophils Relative: 1 %
Eosinophils Absolute: 0 10*3/uL (ref 0.0–0.5)
Eosinophils Relative: 0 %
HCT: 49.9 % (ref 39.0–52.0)
Hemoglobin: 15.7 g/dL (ref 13.0–17.0)
Immature Granulocytes: 4 %
Lymphocytes Relative: 6 %
Lymphs Abs: 1 10*3/uL (ref 0.7–4.0)
MCH: 29.9 pg (ref 26.0–34.0)
MCHC: 31.5 g/dL (ref 30.0–36.0)
MCV: 95 fL (ref 80.0–100.0)
Monocytes Absolute: 0.4 10*3/uL (ref 0.1–1.0)
Monocytes Relative: 2 %
Neutro Abs: 14 10*3/uL — ABNORMAL HIGH (ref 1.7–7.7)
Neutrophils Relative %: 87 %
Platelets: 288 10*3/uL (ref 150–400)
RBC: 5.25 MIL/uL (ref 4.22–5.81)
RDW: 14.9 % (ref 11.5–15.5)
WBC: 16 10*3/uL — ABNORMAL HIGH (ref 4.0–10.5)
nRBC: 0 % (ref 0.0–0.2)

## 2020-08-12 LAB — POCT I-STAT 7, (LYTES, BLD GAS, ICA,H+H)
Acid-base deficit: 3 mmol/L — ABNORMAL HIGH (ref 0.0–2.0)
Bicarbonate: 20.2 mmol/L (ref 20.0–28.0)
Calcium, Ion: 1.2 mmol/L (ref 1.15–1.40)
HCT: 47 % (ref 39.0–52.0)
Hemoglobin: 16 g/dL (ref 13.0–17.0)
O2 Saturation: 89 %
Patient temperature: 98.5
Potassium: 5.1 mmol/L (ref 3.5–5.1)
Sodium: 135 mmol/L (ref 135–145)
TCO2: 21 mmol/L — ABNORMAL LOW (ref 22–32)
pCO2 arterial: 30 mmHg — ABNORMAL LOW (ref 32.0–48.0)
pH, Arterial: 7.437 (ref 7.350–7.450)
pO2, Arterial: 54 mmHg — ABNORMAL LOW (ref 83.0–108.0)

## 2020-08-12 LAB — D-DIMER, QUANTITATIVE: D-Dimer, Quant: 11.62 ug/mL-FEU — ABNORMAL HIGH (ref 0.00–0.50)

## 2020-08-12 LAB — GLUCOSE, CAPILLARY
Glucose-Capillary: 237 mg/dL — ABNORMAL HIGH (ref 70–99)
Glucose-Capillary: 115 mg/dL — ABNORMAL HIGH (ref 70–99)
Glucose-Capillary: 135 mg/dL — ABNORMAL HIGH (ref 70–99)
Glucose-Capillary: 198 mg/dL — ABNORMAL HIGH (ref 70–99)
Glucose-Capillary: 139 mg/dL — ABNORMAL HIGH (ref 70–99)
Glucose-Capillary: 96 mg/dL (ref 70–99)

## 2020-08-12 LAB — PROTIME-INR
INR: 1.2 (ref 0.8–1.2)
Prothrombin Time: 14.6 seconds (ref 11.4–15.2)

## 2020-08-12 LAB — C-REACTIVE PROTEIN: CRP: 4.7 mg/dL — ABNORMAL HIGH (ref <1.0)

## 2020-08-12 MED ORDER — FUROSEMIDE 10 MG/ML IJ SOLN
40.0000 mg | Freq: Once | INTRAMUSCULAR | Status: AC
  Administered 2020-08-12: 40 mg via INTRAVENOUS
  Filled 2020-08-12: qty 4

## 2020-08-12 MED ORDER — SODIUM CHLORIDE 0.9 % IV SOLN
INTRAVENOUS | Status: DC | PRN
  Administered 2020-08-12 – 2020-08-24 (×2): 250 mL via INTRAVENOUS

## 2020-08-12 MED ORDER — LORAZEPAM 1 MG PO TABS
1.0000 mg | ORAL_TABLET | Freq: Four times a day (QID) | ORAL | Status: DC | PRN
  Administered 2020-08-12 – 2020-08-13 (×3): 1 mg via ORAL
  Filled 2020-08-12 (×3): qty 1

## 2020-08-12 NOTE — Progress Notes (Signed)
NAME:  Henry May, MRN:  [id number], DOB:  27-Oct-1966, LOS: 6 ADMISSION DATE:  08/06/2020, CONSULTATION DATE:  08/08/20 REFERRING MD:  Toniann Fail, CHIEF COMPLAINT:  Dyspnea   Brief History   54 y/o M with no significant PMH who presented with increasing shortness of breath. He was diagnosed with Covid-19 on 8/12 and over the last few days his shortness of breath has progressively worsened. O2 sats were 55% on room air on EMS arrival. Chest x-ray with bilateral infiltrates and patient was high flow nasal cannula with EF. PCCM consulted for admission  History of present illness   Henry May 54 year old male with no significant past medical history, non-smoker,  who was diagnosed with Covid-19 on 8/12 along with his wife. He is scheduled to get the Covid-19 vaccine but has not received any doses yet. He reports cough with increasing chest pressure so called on EMS arrival his oxygen was reportedly 55% on room air. He initially improved on BiPAP, chest x-ray with bilateral infiltrates. Creatinine 1.9, glucose 170, bicarb 15, anion gap 25, lactic acid 8.9, WBC 21k, D-dimer >20, LDH 1,073, Ferritin 1,450, CRP 34.    He was initially placed on Bipap, then trial of HFNC. PCCM consulted for increasing WOB on 15L HFNC with non-rebreather.  Pt denies chest pain or recent lower extremity pain or edema  Past Medical History  No past medical history on file  Significant Hospital Events   8/22 admit to hospitalists 8/23 PCCM consult, ICU txfr  8/25 BiPAP and admitted to the ICU  Consults:  PCCM  Procedures:    Significant Diagnostic Tests:  8/22 CXR>>Mild right lung infiltrates with mild to moderate severity left basilar infiltrate.  8/23 ultrasound LE>> acute DVT bilaterally  8/23 echocardiogram>> EF 40 to 45% with regional wall motion abnormality of LV.  Grade 1 diastolic dysfunction.  Severe hypokinesis of LV basal mid inferior wall.  The RV size is mildly enlarged and RA size was moderately  dilated   Micro Data:  8/22 Covid-19>>positive 8/22 BCx2>> no growth to date MRSA negative  Antimicrobials:  Remdesevir 8/22- Azithromycin 8/22-8/23 Ceftriaxone 8/22-8/23 Vancomycin 8/23>> 8/25 Cefepime 8/23>>  Interim history/subjective:  Overnight: Patient experienced respiratory distress which increased oxygen requirements  Patient is seen at bedside today.  Alert and awake.  He is in mild respiratory distress with tachypneic.  Continue high flow and monitor today   I called and spoke to patient spouse.  I updated her with patient's current oxygen need and the importance for him to prone at night.  His wife voiced understanding.  All questions were answered  Objective   Blood pressure 130/86, pulse 87, temperature 98 F (36.7 C), temperature source Oral, resp. rate (!) 38, height 6' (1.829 m), weight 99 kg, SpO2 (!) 82 %.    Vent Mode: Stand-by FiO2 (%):  [90 %-100 %] 100 %   Intake/Output Summary (Last 24 hours) at 08/12/2020 1312 Last data filed at 08/12/2020 0700 Gross per 24 hour  Intake 200 ml  Output 1600 ml  Net -1400 ml   Filed Weights   08/10/20 0500 08/11/20 0500 08/12/20 0500  Weight: 96.3 kg 99.1 kg 99 kg    Examination: Physical Exam Constitutional:      General: He is in acute distress.  HENT:     Head: Normocephalic.  Eyes:     General: No scleral icterus.       Right eye: No discharge.        Left eye: No discharge.  Conjunctiva/sclera: Conjunctivae normal.  Cardiovascular:     Rate and Rhythm: Normal rate and regular rhythm.     Heart sounds: Murmur heard.   Pulmonary:     Comments: Clear lungs sound Tachypneic especially when talking Abdominal:     Palpations: Abdomen is soft.  Musculoskeletal:     Cervical back: Normal range of motion.     Right lower leg: No edema.     Left lower leg: No edema.  Skin:    General: Skin is warm.  Neurological:     Mental Status: He is alert.  Psychiatric:     Comments: Anxious      Resolved Hospital Problem list     Assessment & Plan:  Acute hypoxemic respiratory failure secondary to COVID-19 Sepsis Suspecting superimposed bacterial pneumonia Acute bilateral lower extremities DVTs Hyperkalemia QT prolongation  Plan:  Acute hypoxemic respiratory failure requiring BiPAP COVID-19 pneumonia Continue high flow 30 L, currently satting in the high 80s.  Encourage self proning -Self proning -Continue Remdesivir, baricitinib and Solu-Medrol per pharmacy -Pulmonary toilet -Flutter valve and incentive spirometry -Add Ativan as needed for anxiety, hopefully can help with respiratory status -Diuresis with 1 dose of Lasix 40   Sepsis Lactic acidosis Suspecting superimposed bacterial pneumonia.  Pro-Cal of 6.47 on admission, trend down to 0.38 now.  Blood culture show no growth up to date.  Strep pneumo and Legionella was negative.  Sputum culture was not collected Lactic 6.2-4.5-4.3-4-2.7-1.8 -Continue cefepime (day 5/7) -Monitor fever curve and WBC   Acute bilateral lower extremities DVT Doppler ultrasound shows acute DVT of LE bilaterally.  No obvious sign of right heart strain on echocardiogram.  D-dimer> 20 -Continue therapeutic dose of Lovenox   Hyperkalemia Potassium is trending up 4.7-5.2.  Will continue to monitor. -BMP in a.m.   QT prolongation  Continue to monitor  Best practice:  Diet: Regular Pain/Anxiety/Delirium protocol (if indicated): Ativan VAP protocol (if indicated): N/A DVT prophylaxis: Lovenox GI prophylaxis: N/A Glucose control: Sliding scale Mobility: Bedrest Code Status: Full Family Communication: Updated wife Disposition: ICU  Critical care time: 30 min    Doran Stabler, DO Internal Medicine Residency My pager: [phone]

## 2020-08-12 NOTE — Evaluation (Signed)
Physical Therapy Evaluation Patient Details Name: Henry May MRN: [id number] DOB: 1966-03-15 Today's Date: 08/12/2020   History of Present Illness  54 yo unvaccinated male admitted with hypoxia and WOB due to Covid 19 on 8/22 with initial diagnosis 8/12  Clinical Impression  Pt sitting in recliner on arrival with heated HFNC at 30L, 95% with SpO2 91% pt with desaturation to 84% with standing with recovery to 88% with 2 min seated rest. With maintained standing after pivot for pericare pt with desaturation to 81% with grossly 5 min to recover to 83% with cues for breathing technique and decreased RR as RR up to 50. Pt with decreased cognition, tranfers, functional mobility and cardiopulmonary function who will benefit from acute therapy to maximize independence and safety.     Follow Up Recommendations Home health PT;Supervision/Assistance - 24 hour    Equipment Recommendations  Rolling walker with 5" wheels    Recommendations for Other Services OT consult     Precautions / Restrictions Precautions Precautions: Other (comment) Precaution Comments: watch sats      Mobility  Bed Mobility               General bed mobility comments: pt in recliner on arrival and end of session  Transfers Overall transfer level: Needs assistance   Transfers: Sit to/from Stand;Stand Pivot Transfers Sit to Stand: Min guard Stand pivot transfers: Min guard       General transfer comment: guarding with lines to stand from recliner and pivot to and from [facility]. Pt able to stand with supervision for pericare with desaturation to 81% on HFNC 30L 95%  Ambulation/Gait             General Gait Details: unable to attempt due to pulmonary status  Stairs            Wheelchair Mobility    Modified Rankin (Stroke Patients Only)       Balance Overall balance assessment: Mild deficits observed, not formally tested                                            Pertinent Vitals/Pain Pain Assessment: No/denies pain    Home Living Family/patient expects to be discharged to:: Private residence Living Arrangements: Spouse/significant other;Children Available Help at Discharge: Family Type of Home: House Home Access: Level entry     Home Layout: One level Home Equipment: None      Prior Function Level of Independence: Independent         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        Extremity/Trunk Assessment   Upper Extremity Assessment Upper Extremity Assessment: Overall WFL for tasks assessed    Lower Extremity Assessment Lower Extremity Assessment: Overall WFL for tasks assessed    Cervical / Trunk Assessment Cervical / Trunk Assessment: Normal  Communication   Communication: No difficulties  Cognition Arousal/Alertness: Awake/alert Behavior During Therapy: WFL for tasks assessed/performed Overall Cognitive Status: Impaired/Different from baseline Area of Impairment: Safety/judgement;Problem solving;Orientation                 Orientation Level: Time       Safety/Judgement: Decreased awareness of safety;Decreased awareness of deficits   Problem Solving: Slow processing General Comments: pt found to be sitting in a large pile of stool in his underwear on arrival. Pt reports being aware but not notifying staff  for assist      General Comments      Exercises     Assessment/Plan    PT Assessment Patient needs continued PT services  PT Problem List Decreased mobility;Decreased activity tolerance;Cardiopulmonary status limiting activity;Decreased cognition;Decreased balance;Decreased knowledge of use of DME       PT Treatment Interventions DME instruction;Therapeutic exercise;Gait training;Functional mobility training;Therapeutic activities;Patient/family education;Cognitive remediation    PT Goals (Current goals can be found in the Care Plan section)  Acute Rehab PT Goals Patient Stated  Goal: return to driving a truck PT Goal Formulation: With patient Time For Goal Achievement: 08/26/20 Potential to Achieve Goals: Fair    Frequency Min 3X/week   Barriers to discharge        Co-evaluation               AM-PAC PT "6 Clicks" Mobility  Outcome Measure Help needed turning from your back to your side while in a flat bed without using bedrails?: A Little Help needed moving from lying on your back to sitting on the side of a flat bed without using bedrails?: A Little Help needed moving to and from a bed to a chair (including a wheelchair)?: A Little Help needed standing up from a chair using your arms (e.g., wheelchair or bedside chair)?: A Little Help needed to walk in hospital room?: A Lot Help needed climbing 3-5 steps with a railing? : Total 6 Click Score: 15    End of Session Equipment Utilized During Treatment: Oxygen Activity Tolerance: Patient limited by fatigue Patient left: in chair;with call bell/phone within reach Nurse Communication: Mobility status PT Visit Diagnosis: Other abnormalities of gait and mobility (R26.89);Muscle weakness (generalized) (M62.81);Difficulty in walking, not elsewhere classified (R26.2)    Time: 0998-3382 PT Time Calculation (min) (ACUTE ONLY): 25 min   Charges:   PT Evaluation $PT Eval High Complexity: 1 High          Senay Sistrunk P, PT Acute Rehabilitation Services Pager: [phone] Office: [phone]   Enedina Finner Mikisha Roseland 08/12/2020, 2:25 PM

## 2020-08-12 NOTE — Progress Notes (Signed)
ANTICOAGULATION CONSULT NOTE  Pharmacy Consult for lovenox Indication: DVT  No Known Allergies  Patient Measurements: Height: 6' (182.9 cm) Weight: 99 kg (218 lb 4.1 oz) IBW/kg (Calculated) : 77.6  Vital Signs: Temp: 98 F (36.7 C) (08/28 1100) Temp Source: Oral (08/28 1100) BP: 130/86 (08/28 0727) Pulse Rate: 80 (08/28 0727)  Labs: Recent Labs    08/10/20 0612 08/11/20 0428 08/11/20 0926 08/11/20 0926 08/12/20 0408 08/12/20 0650  HGB 14.2   < > 15.8   < > 16.0 15.7  HCT 44.5   < > 47.8  --  47.0 49.9  PLT 236  --  291  --   --  288  LABPROT 16.8*  --  15.3*  --   --  14.6  INR 1.4*  --  1.3*  --   --  1.2  CREATININE 1.03  --  1.02  --   --  1.06   < > = values in this interval not displayed.    Estimated Creatinine Clearance: 94.9 mL/min (by C-G formula based on SCr of 1.06 mg/dL).   Medical History: History reviewed. No pertinent past medical history.  Assessment: 62 YOM presenting with COVID pna, d-dimer >20, with acute bilateral DVTs found.  Not on anticoagulation PTA.  Pharmacy consulted to dose lovenox. D-dimer trend down to 11. CBC wnl, SCr stable 1.06. Weight fluctuating a bit - diuresed today.  Goal of Therapy:  Anti-Xa level 0.6-1 units/ml 4hrs after LMWH dose given Monitor platelets by anticoagulation protocol: Yes   Plan:  Lovenox 1mg /kg (97.5mg ) SQ every 12 hours Monitor renal function, CBC, s/s bleeding F/u ability to transition to PO anticoagulation as able   , PharmD, BCPS Please check AMION for all [facility] contact numbers Clinical Pharmacist 08/12/2020 2:37 PM

## 2020-08-13 LAB — BASIC METABOLIC PANEL
Anion gap: 9 (ref 5–15)
BUN: 39 mg/dL — ABNORMAL HIGH (ref 6–20)
CO2: 22 mmol/L (ref 22–32)
Calcium: 8.5 mg/dL — ABNORMAL LOW (ref 8.9–10.3)
Chloride: 102 mmol/L (ref 98–111)
Creatinine, Ser: 1.02 mg/dL (ref 0.61–1.24)
GFR calc Af Amer: >60 mL/min (ref >60)
GFR calc non Af Amer: >60 mL/min (ref >60)
Glucose, Bld: 122 mg/dL — ABNORMAL HIGH (ref 70–99)
Potassium: 5.3 mmol/L — ABNORMAL HIGH (ref 3.5–5.1)
Sodium: 133 mmol/L — ABNORMAL LOW (ref 135–145)

## 2020-08-13 LAB — GLUCOSE, CAPILLARY
Glucose-Capillary: 130 mg/dL — ABNORMAL HIGH (ref 70–99)
Glucose-Capillary: 145 mg/dL — ABNORMAL HIGH (ref 70–99)
Glucose-Capillary: 89 mg/dL (ref 70–99)
Glucose-Capillary: 193 mg/dL — ABNORMAL HIGH (ref 70–99)
Glucose-Capillary: 122 mg/dL — ABNORMAL HIGH (ref 70–99)

## 2020-08-13 LAB — POCT I-STAT 7, (LYTES, BLD GAS, ICA,H+H)
Acid-Base Excess: 0 mmol/L (ref 0.0–2.0)
Bicarbonate: 23.4 mmol/L (ref 20.0–28.0)
Calcium, Ion: 1.22 mmol/L (ref 1.15–1.40)
HCT: 50 % (ref 39.0–52.0)
Hemoglobin: 17 g/dL (ref 13.0–17.0)
O2 Saturation: 86 %
Potassium: 5.4 mmol/L — ABNORMAL HIGH (ref 3.5–5.1)
Sodium: 136 mmol/L (ref 135–145)
TCO2: 24 mmol/L (ref 22–32)
pCO2 arterial: 33.5 mmHg (ref 32.0–48.0)
pH, Arterial: 7.453 — ABNORMAL HIGH (ref 7.350–7.450)
pO2, Arterial: 48 mmHg — ABNORMAL LOW (ref 83.0–108.0)

## 2020-08-13 LAB — CBC
HCT: 48.6 % (ref 39.0–52.0)
Hemoglobin: 15.8 g/dL (ref 13.0–17.0)
MCH: 30.2 pg (ref 26.0–34.0)
MCHC: 32.5 g/dL (ref 30.0–36.0)
MCV: 92.7 fL (ref 80.0–100.0)
Platelets: 291 10*3/uL (ref 150–400)
RBC: 5.24 MIL/uL (ref 4.22–5.81)
RDW: 14.3 % (ref 11.5–15.5)
WBC: 17.3 10*3/uL — ABNORMAL HIGH (ref 4.0–10.5)
nRBC: 0 % (ref 0.0–0.2)

## 2020-08-13 LAB — PROTIME-INR
INR: 1.1 (ref 0.8–1.2)
Prothrombin Time: 14.2 seconds (ref 11.4–15.2)

## 2020-08-13 MED ORDER — SODIUM ZIRCONIUM CYCLOSILICATE 5 G PO PACK
5.0000 g | PACK | Freq: Two times a day (BID) | ORAL | Status: DC
  Administered 2020-08-13 – 2020-08-14 (×3): 5 g via ORAL
  Filled 2020-08-13 (×3): qty 1

## 2020-08-13 MED ORDER — FUROSEMIDE 10 MG/ML IJ SOLN
40.0000 mg | Freq: Once | INTRAMUSCULAR | Status: AC
  Administered 2020-08-13: 40 mg via INTRAVENOUS
  Filled 2020-08-13: qty 4

## 2020-08-13 MED ORDER — LORAZEPAM 0.5 MG PO TABS
0.5000 mg | ORAL_TABLET | Freq: Four times a day (QID) | ORAL | Status: DC | PRN
  Administered 2020-08-13 – 2020-08-14 (×2): 0.5 mg via ORAL
  Filled 2020-08-13 (×3): qty 1

## 2020-08-13 MED ORDER — IPRATROPIUM-ALBUTEROL 20-100 MCG/ACT IN AERS
1.0000 | INHALATION_SPRAY | Freq: Three times a day (TID) | RESPIRATORY_TRACT | Status: DC
  Administered 2020-08-13 – 2020-08-22 (×27): 1 via RESPIRATORY_TRACT
  Filled 2020-08-13: qty 4

## 2020-08-13 NOTE — Progress Notes (Signed)
NAME:  Henry May, MRN:  [id number], DOB:  Sep 13, 1966, LOS: 7 ADMISSION DATE:  08/06/2020, CONSULTATION DATE:  08/08/20 REFERRING MD:  Toniann Fail, CHIEF COMPLAINT:  Dyspnea   Brief History   54 y/o M with no significant PMH who presented with increasing shortness of breath. He was diagnosed with Covid-19 on 8/12 and over the last few days his shortness of breath has progressively worsened. O2 sats were 55% on room air on EMS arrival. Chest x-ray with bilateral infiltrates and patient was high flow nasal cannula with EF. PCCM consulted for admission  History of present illness   Henry May 54 year old male with no significant past medical history, non-smoker,  who was diagnosed with Covid-19 on 8/12 along with his wife. He is scheduled to get the Covid-19 vaccine but has not received any doses yet. He reports cough with increasing chest pressure so called on EMS arrival his oxygen was reportedly 55% on room air. He initially improved on BiPAP, chest x-ray with bilateral infiltrates. Creatinine 1.9, glucose 170, bicarb 15, anion gap 25, lactic acid 8.9, WBC 21k, D-dimer >20, LDH 1,073, Ferritin 1,450, CRP 34.    He was initially placed on Bipap, then trial of HFNC. PCCM consulted for increasing WOB on 15L HFNC with non-rebreather.  Pt denies chest pain or recent lower extremity pain or edema  Past Medical History  No past medical history on file  Significant Hospital Events   8/22 admit to hospitalists 8/23 PCCM consult, ICU txfr  8/25 BiPAP and admitted to the ICU  Consults:  PCCM  Procedures:    Significant Diagnostic Tests:  8/22 CXR>>Mild right lung infiltrates with mild to moderate severity left basilar infiltrate.  8/23 ultrasound LE>> acute DVT bilaterally  8/23 echocardiogram>> EF 40 to 45% with regional wall motion abnormality of LV.  Grade 1 diastolic dysfunction.  Severe hypokinesis of LV basal mid inferior wall.  The RV size is mildly enlarged and RA size was moderately  dilated   Micro Data:  8/22 Covid-19>>positive 8/22 BCx2>> no growth to date MRSA negative  Antimicrobials:  Remdesevir 8/22- Azithromycin 8/22-8/23 Ceftriaxone 8/22-8/23 Vancomycin 8/23>> 8/25 Cefepime 8/23>>  Interim history/subjective:  No acute event overnight  Patient is seen at bedside today.  Alert and awake and eating breakfast.  Currently satting in the mid 80s on 50 L.  Patient initially satting well in the high 90s while sleeping but desat when awake and talking.  He is anxious at baseline.  Patient states that he was prone for 2 hours last night.  I called and spoke to patient's spouse, [name].  I explained to her the patient's current condition and the importance of self proning.  She voiced understanding.  All questions were answered  Objective   Blood pressure 127/82, pulse 91, temperature 99.1 F (37.3 C), temperature source Oral, resp. rate (!) 34, height 6' (1.829 m), weight 99 kg, SpO2 90 %.    FiO2 (%):  [90 %-100 %] 90 %   Intake/Output Summary (Last 24 hours) at 08/13/2020 0831 Last data filed at 08/13/2020 0700 Gross per 24 hour  Intake 902.28 ml  Output 2275 ml  Net -1372.72 ml   Filed Weights   08/10/20 0500 08/11/20 0500 08/12/20 0500  Weight: 96.3 kg 99.1 kg 99 kg    Examination: Physical Exam Constitutional:      General: He is in acute distress.     Comments: Tachypneic  HENT:     Head: Normocephalic.  Eyes:     General:  No scleral icterus.       Right eye: No discharge.        Left eye: No discharge.     Conjunctiva/sclera: Conjunctivae normal.  Cardiovascular:     Rate and Rhythm: Normal rate and regular rhythm.     Heart sounds: No murmur heard.   Pulmonary:     Comments: Mild crackles noted in right lung base  Abdominal:     Palpations: Abdomen is soft.  Musculoskeletal:     Cervical back: Normal range of motion.     Right lower leg: No edema.     Left lower leg: No edema.  Skin:    General: Skin is warm.  Neurological:      Mental Status: He is alert.  Psychiatric:     Comments: Anxious     Resolved Hospital Problem list     Assessment & Plan:  Acute hypoxemic respiratory failure secondary to COVID-19 Sepsis Suspecting superimposed bacterial pneumonia Heart failure with reduced ejection fraction Acute bilateral lower extremities DVTs Hyperkalemia Hyponatremia QT prolongation  Plan:  Acute hypoxemic respiratory failure requiring BiPAP COVID-19 pneumonia Chest x-ray shows  stable bilateral infiltrates.  Continue high flow 50 L, currently satting in the high 80s.  Encourage self proning.  Will try BiPAP at night.  Added Combivent -Self proning -Continue Remdesivir, baricitinib and Solu-Medrol per pharmacy -Pulmonary toilet -Flutter valve and incentive spirometry -Add Ativan as needed for anxiety, hopefully can help with respiratory status -Diuresis again today   Heart failure with reduced ejection fraction Echo 8/23 show EF 40 to 45% with regional wall motion abnormalities.  Continue diuresis with Lasix 40 mg today -Monitor I/O   Sepsis Lactic acidosis Suspecting superimposed bacterial pneumonia.  Pro-Cal of 6.47 on admission, trend down to 0.38 now.  Blood culture show no growth up to date.  Strep pneumo and Legionella was negative.  Sputum culture was not collected Lactic 6.2-4.5-4.3-4-2.7-1.8 -Continue cefepime (day 7/7) -Monitor fever curve and WBC   Acute bilateral lower extremities DVT Doppler ultrasound shows acute DVT of LE bilaterally.  No obvious sign of right heart strain on echocardiogram.  D-dimer> 20 -Continue therapeutic dose of Lovenox   Hyperkalemia Potassium is trending up 4.7-5.2-5.3. Unclear etiology. Differentials to include beta-blocker or respiratory acidosis. Normal renal function. Lokelma 5 given for 2 doses. Continue to trend potassium. -Lasix 40 today -BMP in a.m.   Hyponatremia Likely secondary to Lasix.  Continue to monitor -BMP in a.m.   QT  prolongation  Continue to monitor  Best practice:  Diet: Regular Pain/Anxiety/Delirium protocol (if indicated): Ativan VAP protocol (if indicated): N/A DVT prophylaxis: Lovenox GI prophylaxis: N/A Glucose control: Sliding scale Mobility: Bedrest Code Status: Full Family Communication: Updated wife Disposition: ICU  Critical care time: 30 min    Doran Stabler, DO Internal Medicine Residency My pager: [phone]

## 2020-08-14 DIAGNOSIS — J9601 Acute respiratory failure with hypoxia: Secondary | ICD-10-CM

## 2020-08-14 LAB — CBC
HCT: 53.5 % — ABNORMAL HIGH (ref 39.0–52.0)
Hemoglobin: 17.5 g/dL — ABNORMAL HIGH (ref 13.0–17.0)
MCH: 30.8 pg (ref 26.0–34.0)
MCHC: 32.7 g/dL (ref 30.0–36.0)
MCV: 94.2 fL (ref 80.0–100.0)
Platelets: 328 10*3/uL (ref 150–400)
RBC: 5.68 MIL/uL (ref 4.22–5.81)
RDW: 14.3 % (ref 11.5–15.5)
WBC: 16.7 10*3/uL — ABNORMAL HIGH (ref 4.0–10.5)
nRBC: 0 % (ref 0.0–0.2)

## 2020-08-14 LAB — BASIC METABOLIC PANEL
Anion gap: 11 (ref 5–15)
BUN: 44 mg/dL — ABNORMAL HIGH (ref 6–20)
CO2: 26 mmol/L (ref 22–32)
Calcium: 9.2 mg/dL (ref 8.9–10.3)
Chloride: 96 mmol/L — ABNORMAL LOW (ref 98–111)
Creatinine, Ser: 1.13 mg/dL (ref 0.61–1.24)
GFR calc Af Amer: >60 mL/min (ref >60)
GFR calc non Af Amer: >60 mL/min (ref >60)
Glucose, Bld: 119 mg/dL — ABNORMAL HIGH (ref 70–99)
Potassium: 5.4 mmol/L — ABNORMAL HIGH (ref 3.5–5.1)
Sodium: 133 mmol/L — ABNORMAL LOW (ref 135–145)

## 2020-08-14 LAB — PROTIME-INR
INR: 1.2 (ref 0.8–1.2)
Prothrombin Time: 14.3 seconds (ref 11.4–15.2)

## 2020-08-14 LAB — GLUCOSE, CAPILLARY
Glucose-Capillary: 107 mg/dL — ABNORMAL HIGH (ref 70–99)
Glucose-Capillary: 110 mg/dL — ABNORMAL HIGH (ref 70–99)
Glucose-Capillary: 111 mg/dL — ABNORMAL HIGH (ref 70–99)
Glucose-Capillary: 122 mg/dL — ABNORMAL HIGH (ref 70–99)
Glucose-Capillary: 123 mg/dL — ABNORMAL HIGH (ref 70–99)
Glucose-Capillary: 143 mg/dL — ABNORMAL HIGH (ref 70–99)
Glucose-Capillary: 154 mg/dL — ABNORMAL HIGH (ref 70–99)

## 2020-08-14 MED ORDER — SODIUM ZIRCONIUM CYCLOSILICATE 10 G PO PACK
10.0000 g | PACK | Freq: Two times a day (BID) | ORAL | Status: DC
  Administered 2020-08-14 – 2020-08-15 (×3): 10 g via ORAL
  Filled 2020-08-14 (×3): qty 1

## 2020-08-14 MED ORDER — ENOXAPARIN SODIUM 100 MG/ML ~~LOC~~ SOLN
1.0000 mg/kg | Freq: Two times a day (BID) | SUBCUTANEOUS | Status: DC
  Administered 2020-08-14 – 2020-08-16 (×4): 92.5 mg via SUBCUTANEOUS
  Filled 2020-08-14 (×4): qty 0.93

## 2020-08-14 MED ORDER — FLUTICASONE PROPIONATE 50 MCG/ACT NA SUSP
2.0000 | Freq: Every day | NASAL | Status: DC
  Administered 2020-08-14 – 2020-08-22 (×9): 2 via NASAL
  Filled 2020-08-14: qty 16

## 2020-08-14 MED ORDER — MORPHINE SULFATE (PF) 2 MG/ML IV SOLN
0.5000 mg | Freq: Three times a day (TID) | INTRAVENOUS | Status: DC | PRN
  Administered 2020-08-15: 0.5 mg via INTRAVENOUS
  Filled 2020-08-14: qty 1

## 2020-08-14 MED ORDER — MORPHINE SULFATE (PF) 2 MG/ML IV SOLN
0.5000 mg | Freq: Once | INTRAVENOUS | Status: AC
  Administered 2020-08-14: 0.5 mg via INTRAVENOUS
  Filled 2020-08-14: qty 1

## 2020-08-14 NOTE — Progress Notes (Signed)
Wife, Leonie Douglas updated by phone.     Posey Boyer, MSN, AGACNP-BC [facility] 08/14/2020, 2:03 PM  See Amion for personal pager PCCM on call pager [phone]

## 2020-08-14 NOTE — Progress Notes (Signed)
Wife and daughter were updated on the phone. All questions were answered at this time. Family dropped off clothing and water for the patient.

## 2020-08-14 NOTE — Progress Notes (Signed)
NAME:  Henry May, MRN:  [id number], DOB:  06-12-1966, LOS: 8 ADMISSION DATE:  08/06/2020, CONSULTATION DATE:  08/08/20 REFERRING MD:  Henry May, CHIEF COMPLAINT:  Dyspnea   Brief History   54 y/o M with no significant PMH who presented with increasing shortness of breath. He was diagnosed with Covid-19 on 8/12 and over the last few days his shortness of breath has progressively worsened. O2 sats were 55% on room air on EMS arrival. Chest x-ray with bilateral infiltrates and patient was high flow nasal cannula with EF. PCCM consulted for admission  History of present illness   Henry May 54 year old male with no significant past medical history, non-smoker,  who was diagnosed with Covid-19 on 8/12 along with his wife. He is scheduled to get the Covid-19 vaccine but has not received any doses yet. He reports cough with increasing chest pressure so called on EMS arrival his oxygen was reportedly 55% on room air. He initially improved on BiPAP, chest x-ray with bilateral infiltrates. Creatinine 1.9, glucose 170, bicarb 15, anion gap 25, lactic acid 8.9, WBC 21k, D-dimer >20, LDH 1,073, Ferritin 1,450, CRP 34.    He was initially placed on Bipap, then trial of HFNC. PCCM consulted for increasing WOB on 15L HFNC with non-rebreather.  Pt denies chest pain or recent lower extremity pain or edema  Past Medical History  No past medical history on file  Significant Hospital Events   8/22 admit to hospitalists 8/23 PCCM consult, ICU txfr  8/25 BiPAP and admitted to the ICU  Consults:  PCCM  Procedures:    Significant Diagnostic Tests:  8/22 CXR>>Mild right lung infiltrates with mild to moderate severity left basilar infiltrate.  8/23 ultrasound LE>> acute DVT bilaterally  8/23 echocardiogram>> EF 40 to 45% with regional wall motion abnormality of LV.  Grade 1 diastolic dysfunction.  Severe hypokinesis of LV basal mid inferior wall.  The RV size is mildly enlarged and RA size was moderately  dilated   Micro Data:  8/22 Covid-19>>positive 8/22 BCx2>> neg 8/25 MRSA PCR >> negative   Antimicrobials:  Remdesevir 8/22 >> 8/26 baricitinib 8/25 >> Azithromycin 8/22 >> 8/23 Ceftriaxone 8/22 >> 8/23 Vancomycin 8/23>> 8/25 Cefepime 8/23>> 8/29  Interim history/subjective:   Remains on HHFNC 50L and 90% FiO2 Self pronning overnight Declined BiPAP overnight Ongoing anxiety affecting his tachypnea and BiPAP tolerance No complaints this morning -1.2/ net -5.4L  tmax 98.6  Objective   Blood pressure 129/86, pulse 89, temperature 97.7 F (36.5 C), temperature source Oral, resp. rate (!) 23, height 6' (1.829 m), weight 91.6 kg, SpO2 (!) 79 %.    FiO2 (%):  [90 %-100 %] 100 %   Intake/Output Summary (Last 24 hours) at 08/14/2020 1035 Last data filed at 08/14/2020 0700 Gross per 24 hour  Intake 504.97 ml  Output 2150 ml  Net -1645.03 ml   Filed Weights   08/11/20 0500 08/12/20 0500 08/14/20 0406  Weight: 99.1 kg 99 kg 91.6 kg   General:  Adult male sitting upright in bed on HHFNC in no distress HEENT: MM pink/moist Neuro: Alert, oriented, MAE CV: rr, no murmur PULM: tachypneic but non labored, CTA GI: soft, bs active, NT  Extremities: warm/dry, no LE edema  Skin: no rashes   Resolved Hospital Problem list     Assessment & Plan:  Acute hypoxemic respiratory failure secondary to COVID-19 Sepsis Suspecting superimposed bacterial pneumonia Heart failure with reduced ejection fraction Acute bilateral lower extremities DVTs Hyperkalemia Hyponatremia QT prolongation  Plan:  Acute hypoxemic respiratory failure requiring BiPAP COVID-19 pneumonia Chest x-ray 8/28 shows stable bilateral infiltrates.   Currently in no distress and happy hypoxic with acceptable saturations on HHFNC   Maintain O2 saturations for goal SpO2 > 85% Monitor closely for signs of ventilatory failure such as nasal flaring, accessory muscle use, abdominal paradoxical movements.   Self  proning as tolerated Trend inflammatory markers Ongoing aggressive pulmonary hygiene- IS and flutter valve, progress activity as tolerated -> OOB to chair today  Continue baricitinib per pharmacy  Trial of morphine 0.5 mg to see if better helps with tachypnea, exacerbated by anxiety Continue ativan 0.5mg  TID prn Continue solumedrol taper per pharmacy  Hold diuresis today, at neg balance Continue combivent 1 puff TID   Heart failure with reduced ejection fraction Echo 8/23 show EF 40 to 45% with regional wall motion abnormalities.   - hold lasix today - negative balance, wts 106.6kg on admit-> today 91.6   Sepsis Lactic acidosis Suspecting superimposed bacterial pneumonia.  Pro-Cal of 6.47 on admission, trend down to 0.38 now. Strep pneumo and Legionella was negative.  Sputum culture was not collected - 7 days cefepime completed 8/29 - monitor clinically, trend WBC/ fever curve   Acute bilateral lower extremities DVT Doppler ultrasound shows acute DVT of LE bilaterally.  No obvious sign of right heart strain on echocardiogram.  D-dimer> 20 -Continue lovenox per pharmacy   Hyperkalemia Potassium is trending up 4.7-5.2-5.3- 5.4. Unclear etiology. Differentials to include beta-blocker or respiratory acidosis - renal function remains stable - increase lokelmia to 10 mg BID  Hyponatremia - trend BMET  QT prolongation  - tele monitoring, avoid QTc prolonging agents   Best practice:  Diet: Regular Pain/Anxiety/Delirium protocol (if indicated): Ativan prn  VAP protocol (if indicated): N/A DVT prophylaxis: Lovenox GI prophylaxis: N/A Glucose control: Sliding scale Mobility: Bedrest Code Status: Full Family Communication: pending 8/30 Disposition: remains in ICU at this time  Critical care time: 30 min     Henry Boyer, MSN, AGACNP-BC [facility] 08/14/2020, 10:55 AM  See [facility] for personal pager PCCM on call pager [phone]

## 2020-08-14 NOTE — Progress Notes (Signed)
This note also relates to the following rows which could not be included: Pulse Rate - Cannot attach notes to unvalidated device data Resp - Cannot attach notes to unvalidated device data SpO2 - Cannot attach notes to unvalidated device data  When pt is in a deep sleep, his SpO2 drops to the upper 80's, while awake, pt has been able to maintain SpO2 around 94%.

## 2020-08-15 ENCOUNTER — Inpatient Hospital Stay (HOSPITAL_COMMUNITY): Payer: BC Managed Care – PPO

## 2020-08-15 DIAGNOSIS — J9601 Acute respiratory failure with hypoxia: Secondary | ICD-10-CM

## 2020-08-15 LAB — BASIC METABOLIC PANEL
Anion gap: 12 (ref 5–15)
BUN: 42 mg/dL — ABNORMAL HIGH (ref 6–20)
CO2: 27 mmol/L (ref 22–32)
Calcium: 8.9 mg/dL (ref 8.9–10.3)
Chloride: 96 mmol/L — ABNORMAL LOW (ref 98–111)
Creatinine, Ser: 1.27 mg/dL — ABNORMAL HIGH (ref 0.61–1.24)
GFR calc Af Amer: >60 mL/min (ref >60)
GFR calc non Af Amer: >60 mL/min (ref >60)
Glucose, Bld: 118 mg/dL — ABNORMAL HIGH (ref 70–99)
Potassium: 5.6 mmol/L — ABNORMAL HIGH (ref 3.5–5.1)
Sodium: 135 mmol/L (ref 135–145)

## 2020-08-15 LAB — CBC
HCT: 52.4 % — ABNORMAL HIGH (ref 39.0–52.0)
Hemoglobin: 16.6 g/dL (ref 13.0–17.0)
MCH: 29.9 pg (ref 26.0–34.0)
MCHC: 31.7 g/dL (ref 30.0–36.0)
MCV: 94.2 fL (ref 80.0–100.0)
Platelets: 372 10*3/uL (ref 150–400)
RBC: 5.56 MIL/uL (ref 4.22–5.81)
RDW: 14 % (ref 11.5–15.5)
WBC: 15.8 10*3/uL — ABNORMAL HIGH (ref 4.0–10.5)
nRBC: 0 % (ref 0.0–0.2)

## 2020-08-15 LAB — CREATININE, URINE, RANDOM: Creatinine, Urine: 113.71 mg/dL

## 2020-08-15 LAB — GLUCOSE, CAPILLARY
Glucose-Capillary: 114 mg/dL — ABNORMAL HIGH (ref 70–99)
Glucose-Capillary: 115 mg/dL — ABNORMAL HIGH (ref 70–99)
Glucose-Capillary: 152 mg/dL — ABNORMAL HIGH (ref 70–99)
Glucose-Capillary: 207 mg/dL — ABNORMAL HIGH (ref 70–99)
Glucose-Capillary: 113 mg/dL — ABNORMAL HIGH (ref 70–99)
Glucose-Capillary: 82 mg/dL (ref 70–99)

## 2020-08-15 LAB — PROTIME-INR
INR: 1.2 (ref 0.8–1.2)
Prothrombin Time: 14.8 seconds (ref 11.4–15.2)

## 2020-08-15 LAB — SODIUM, URINE, RANDOM: Sodium, Ur: 20 mmol/L

## 2020-08-15 MED ORDER — DEXMEDETOMIDINE HCL IN NACL 400 MCG/100ML IV SOLN: 0.4000 ug/kg/h | INTRAVENOUS | Status: DC | PRN

## 2020-08-15 MED ORDER — PANTOPRAZOLE SODIUM 40 MG PO TBEC
40.0000 mg | DELAYED_RELEASE_TABLET | Freq: Every day | ORAL | Status: DC
  Administered 2020-08-15 – 2020-08-22 (×8): 40 mg via ORAL
  Filled 2020-08-15 (×9): qty 1

## 2020-08-15 NOTE — Evaluation (Signed)
Occupational Therapy Evaluation Patient Details Name: Henry May MRN: [id number] DOB: Mar 31, 1966 Today's Date: 08/15/2020    History of Present Illness 54 yo unvaccinated male admitted with hypoxia and WOB due to Covid 19 on 8/22 with initial diagnosis 8/12   Clinical Impression   PTA, pt was living with his wife and was independent and working as a [employer]. Pt currently requiring Min A for UB ADLs, Mod-Max A for LB ADLs, and Min A +2 for functional mobility. Pt presenting with decreased balance, strength, cognition, and activity tolerance impacting his safe performance and participation of ADLs. Pt requiring significant seated rest breaks and cues for purse lip breathing to recover SpO2.  Pt would benefit from further acute OT to facilitate safe dc. Due to PLOF, age, and high motivation, recommend dc to CIR for further OT to optimize safety, independence with ADLs, and return to PLOF.   SpO2 86% on 40L at 50% FiO2 at rest. SpO2 flucuating between 88-83% in standing.  HR 80-90s.  BP 128/101 RR 20-30s with rest.  RR elevating to 30-40s with activity    Follow Up Recommendations  CIR;Supervision/Assistance - 24 hour ; may progress to home with [facility]   Equipment Recommendations  3 in 1 bedside commode    Recommendations for Other Services PT consult;Rehab consult     Precautions / Restrictions Precautions Precautions: Other (comment) Precaution Comments: watch sats      Mobility Bed Mobility Overal bed mobility: Needs Assistance Bed Mobility: Supine to Sit     Supine to sit: Min assist     General bed mobility comments: Min A to pull trunk into upright position  Transfers Overall transfer level: Needs assistance Equipment used: 1 person hand held assist Transfers: Sit to/from [employer] Transfers Sit to Stand: Min guard;+2 safety/equipment         General transfer comment: Pt requiring close Min guard A to power up into standing. Requesting hand held A  in standing    Balance Overall balance assessment: Needs assistance Sitting-balance support: No upper extremity supported;Feet supported Sitting balance-Leahy Scale: Fair     Standing balance support: Single extremity supported;During functional activity Standing balance-Leahy Scale: Poor Standing balance comment: Reliant on UE support                           ADL either performed or assessed with clinical judgement   ADL Overall ADL's : Needs assistance/impaired Eating/Feeding: Set up;Sitting   Grooming: Set up;Sitting   Upper Body Bathing: Minimal assistance;Sitting   Lower Body Bathing: Moderate assistance;Sit to/from stand   Upper Body Dressing : Minimal assistance;Sitting   Lower Body Dressing: Maximal assistance;Sit to/from stand   Toilet Transfer: Minimal assistance;+2 for safety/equipment;Ambulation (simulated to recliner, short distance) Toilet Transfer Details (indicate cue type and reason): Min A for single hand held and guidance during stand>sit         Functional mobility during ADLs: Minimal assistance;+2 for safety/equipment General ADL Comments: Pt presenting with decreased activity tolerance requiring significant rest breaks and cues for purse lip breathing to recover after activity     Vision         Perception     Praxis      Pertinent Vitals/Pain Pain Assessment: No/denies pain     Hand Dominance     Extremity/Trunk Assessment Upper Extremity Assessment Upper Extremity Assessment: Overall WFL for tasks assessed   Lower Extremity Assessment Lower Extremity Assessment: Generalized weakness   Cervical /  Trunk Assessment Cervical / Trunk Assessment: Normal   Communication Communication Communication: No difficulties   Cognition Arousal/Alertness: Awake/alert Behavior During Therapy: WFL for tasks assessed/performed Overall Cognitive Status: Impaired/Different from baseline Area of Impairment: Safety/judgement;Problem  solving;Orientation                 Orientation Level: Time       Safety/Judgement: Decreased awareness of safety;Decreased awareness of deficits   Problem Solving: Slow processing General Comments: Requiring increased time and cues throughout. Repeating certain questions. After education on rehab needs, pt asking therapist if he will be able to leave the hospital in two days.   General Comments  SpO2 86% on 40L at 50% FiO2 at rest. RR 20-30s with rest. SpO2 flucuating between 88-83% in standing and RR elevating to 30-40s. HR 80-90s. BP 128/101    Exercises Exercises: Other exercises Other Exercises Other Exercises: Purse lip breathing Other Exercises: IS. Pull 500 ml. 10x   Shoulder Instructions      Home Living Family/patient expects to be discharged to:: Private residence Living Arrangements: Spouse/significant other;Children Available Help at Discharge: Family Type of Home: House Home Access: Level entry     Home Layout: One level     Bathroom Shower/Tub: Chief Strategy Officer: Standard     Home Equipment: None          Prior Functioning/Environment Level of Independence: Independent        Comments: truck driver        OT Problem List: Decreased strength;Decreased range of motion;Decreased activity tolerance;Impaired balance (sitting and/or standing);Decreased safety awareness;Decreased knowledge of use of DME or AE;Decreased knowledge of precautions;Decreased cognition;Cardiopulmonary status limiting activity      OT Treatment/Interventions: Self-care/ADL training;Therapeutic exercise;Energy conservation;DME and/or AE instruction;Therapeutic activities;Patient/family education    OT Goals(Current goals can be found in the care plan section) Acute Rehab OT Goals 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 OT Goal Formulation: With patient Time For Goal Achievement: 08/29/20 Potential to Achieve Goals: Good  OT Frequency: Min  2X/week   Barriers to D/C:            Co-evaluation              AM-PAC OT "6 Clicks" Daily Activity     Outcome Measure Help from another person eating meals?: A Little Help from another person taking care of personal grooming?: A Little Help from another person toileting, which includes using toliet, bedpan, or urinal?: A Little Help from another person bathing (including washing, rinsing, drying)?: A Little Help from another person to put on and taking off regular upper body clothing?: A Little Help from another person to put on and taking off regular lower body clothing?: A Lot 6 Click Score: 17   End of Session Equipment Utilized During Treatment: Oxygen (40L heated HFNC with FiO2 at 100%) Nurse Communication: Mobility status;Other (comment) (Vitals)  Activity Tolerance: Patient tolerated treatment well Patient left: in chair;with call bell/phone within reach  OT Visit Diagnosis: Unsteadiness on feet (R26.81);Other abnormalities of gait and mobility (R26.89);Muscle weakness (generalized) (M62.81)                Time: 0093-8182 OT Time Calculation (min): 30 min Charges:  OT General Charges $OT Visit: 1 Visit OT Evaluation $OT Eval Moderate Complexity: 1 Mod  Dann Ventress MSOT, OTR/L Acute Rehab Pager: [phone] Office: [phone]  Theodoro Grist Viona Hosking 08/15/2020, 5:36 PM

## 2020-08-15 NOTE — Progress Notes (Signed)
Bipap on standby at this time.  Pt satting well and WOB good on HHFNC.  Pt developed nose bleeds today and concern for placing bipap.  RT will monitor for need.

## 2020-08-15 NOTE — Progress Notes (Signed)
Physical Therapy Treatment Patient Details Name: Henry May MRN: [id number] DOB: Feb 12, 1966 Today's Date: 08/15/2020    History of Present Illness 54 yo unvaccinated male admitted with hypoxia and WOB due to Covid 19 on 8/22 with initial diagnosis 8/12    PT Comments    Pt anxious about working with therapies this afternoon due to worsening respiratory status and increased O2 needs.  Emphasized coaching to help patient keep sats in line on 40L at 100% FiO2, which he struggled to sustain.  Also stressed transitions, transfers and standing trials.    Follow Up Recommendations  Supervision/Assistance - 24 hour;CIR;Other (comment) (due to worsening status)     Equipment Recommendations  Rolling walker with 5" wheels    Recommendations for Other Services       Precautions / Restrictions Precautions Precautions: Other (comment) Precaution Comments: watch sats    Mobility  Bed Mobility Overal bed mobility: Needs Assistance Bed Mobility: Supine to Sit     Supine to sit: Min assist     General bed mobility comments: Min A to pull trunk into upright position  Transfers Overall transfer level: Needs assistance Equipment used: 1 person hand held assist Transfers: Sit to/from [employer] Transfers Sit to Stand: Min guard;+2 safety/equipment         General transfer comment: Pt requiring close Min guard A to power up into standing. Requesting hand held A in standing  Ambulation/Gait                 Stairs             Wheelchair Mobility    Modified Rankin (Stroke Patients Only)       Balance Overall balance assessment: Needs assistance Sitting-balance support: No upper extremity supported;Feet supported Sitting balance-Leahy Scale: Fair     Standing balance support: Single extremity supported;During functional activity Standing balance-Leahy Scale: Poor Standing balance comment: Reliant on UE support                             Cognition Arousal/Alertness: Awake/alert Behavior During Therapy: WFL for tasks assessed/performed Overall Cognitive Status: Impaired/Different from baseline Area of Impairment: Safety/judgement;Problem solving;Orientation                 Orientation Level: Time       Safety/Judgement: Decreased awareness of safety;Decreased awareness of deficits   Problem Solving: Slow processing General Comments: Requiring increased time and cues throughout. Repeating certain questions. After education on rehab needs, pt asking therapist if he will be able to leave the hospital in two days.      Exercises Other Exercises Other Exercises: Purse lip breathing Other Exercises: IS. Pull 500 ml. 10x    General Comments General comments (skin integrity, edema, etc.): SpO2 86% on 40L at 50% FiO2 at rest. RR 20-30s with rest. SpO2 flucuating between 88-83% in standing and RR elevating to 30-40s. HR 80-90s. BP 128/101      Pertinent Vitals/Pain Pain Assessment: No/denies pain    Home Living Family/patient expects to be discharged to:: Private residence Living Arrangements: Spouse/significant other;Children Available Help at Discharge: Family Type of Home: House Home Access: Level entry   Home Layout: One level Home Equipment: None      Prior Function Level of Independence: Independent      Comments: truck driver   PT Goals (current goals can now be found in the care plan section) Acute Rehab PT Goals Patient Stated Goal: return to  driving a truck PT Goal Formulation: With patient Time For Goal Achievement: 08/26/20 Potential to Achieve Goals: Fair Progress towards PT goals: Progressing toward goals    Frequency    Min 3X/week      PT Plan Discharge plan needs to be updated    Co-evaluation PT/OT/SLP Co-Evaluation/Treatment: Yes Reason for Co-Treatment: Complexity of the patient's impairments (multi-system involvement);To address functional/ADL transfers PT goals  addressed during session: Mobility/safety with mobility OT goals addressed during session: ADL's and self-care      AM-PAC PT "6 Clicks" Mobility   Outcome Measure  Help needed turning from your back to your side while in a flat bed without using bedrails?: A Little Help needed moving from lying on your back to sitting on the side of a flat bed without using bedrails?: A Little Help needed moving to and from a bed to a chair (including a wheelchair)?: A Little Help needed standing up from a chair using your arms (e.g., wheelchair or bedside chair)?: A Little Help needed to walk in hospital room?: A Lot Help needed climbing 3-5 steps with a railing? : Total 6 Click Score: 15    End of Session Equipment Utilized During Treatment: Oxygen Activity Tolerance: Patient limited by fatigue Patient left: in chair;with call bell/phone within reach Nurse Communication: Mobility status PT Visit Diagnosis: Other abnormalities of gait and mobility (R26.89);Muscle weakness (generalized) (M62.81);Difficulty in walking, not elsewhere classified (R26.2)     Time: 2119-4174 PT Time Calculation (min) (ACUTE ONLY): 30 min  Charges:  $Therapeutic Activity: 8-22 mins                     08/15/2020  Jacinto Halim., PT Acute Rehabilitation Services [phone]  (pager) [phone]  (office)   [name] Lianne Carreto 08/15/2020, 5:48 PM

## 2020-08-15 NOTE — Progress Notes (Signed)
NAME:  Henry May, MRN:  [id number], DOB:  01-19-1966, LOS: 9 ADMISSION DATE:  08/06/2020, CONSULTATION DATE:  08/08/20 REFERRING MD:  Toniann Fail, CHIEF COMPLAINT:  Dyspnea   Brief History   54 y/o M with no significant PMH who presented with increasing shortness of breath. He was diagnosed with Covid-19 on 8/12 and over the last few days his shortness of breath has progressively worsened. O2 sats were 55% on room air on EMS arrival. Chest x-ray with bilateral infiltrates and patient was high flow nasal cannula with EF. PCCM consulted for admission  History of present illness   Henry May 54 year old male with no significant past medical history, non-smoker,  who was diagnosed with Covid-19 on 8/12 along with his wife. He is scheduled to get the Covid-19 vaccine but has not received any doses yet. He reports cough with increasing chest pressure so called on EMS arrival his oxygen was reportedly 55% on room air. He initially improved on BiPAP, chest x-ray with bilateral infiltrates. Creatinine 1.9, glucose 170, bicarb 15, anion gap 25, lactic acid 8.9, WBC 21k, D-dimer >20, LDH 1,073, Ferritin 1,450, CRP 34.    He was initially placed on Bipap, then trial of HFNC. PCCM consulted for increasing WOB on 15L HFNC with non-rebreather.  Pt denies chest pain or recent lower extremity pain or edema  Past Medical History  No past medical history on file  Significant Hospital Events   8/22 admit to hospitalists 8/23 PCCM consult, ICU txfr  8/25 BiPAP and admitted to the ICU  Consults:  PCCM  Procedures:    Significant Diagnostic Tests:  8/22 CXR>>Mild right lung infiltrates with mild to moderate severity left basilar infiltrate.  8/23 ultrasound LE>> acute DVT bilaterally  8/23 echocardiogram>> EF 40 to 45% with regional wall motion abnormality of LV.  Grade 1 diastolic dysfunction.  Severe hypokinesis of LV basal mid inferior wall.  The RV size is mildly enlarged and RA size was moderately  dilated   Micro Data:  8/22 Covid-19>>positive 8/22 BCx2>> neg 8/25 MRSA PCR >> negative   Antimicrobials:  Remdesevir 8/22 >> 8/26 baricitinib 8/25 >> Azithromycin 8/22 >> 8/23 Ceftriaxone 8/22 >> 8/23 Vancomycin 8/23>> 8/25 Cefepime 8/23>> 8/29  Interim history/subjective:  Patient was started on morphine for sleep yesterday.  Patient is able to tolerate BiPAP at night with good SPO2  Patient is seen at bedside.  He is anxious and tachypneic with shallow breaths.  Encourage patient to use his incentive spirometry.  I called and spoke to patient's spouse, Henry May.  I updated her with patient's respiratory status and his improvement with BiPAP use at night.  She voiced understand.  All questions were answered.  Objective   Blood pressure (!) 126/91, pulse (!) 101, temperature (!) 97.4 F (36.3 C), temperature source Oral, resp. rate (!) 30, height 6' (1.829 m), weight 92 kg, SpO2 (!) 87 %.    Vent Mode: PCV;BIPAP FiO2 (%):  [100 %] 100 % Set Rate:  [15 bmp] 15 bmp PEEP:  [8 cmH20] 8 cmH20   Intake/Output Summary (Last 24 hours) at 08/15/2020 1032 Last data filed at 08/15/2020 0800 Gross per 24 hour  Intake 480 ml  Output 1125 ml  Net -645 ml   Filed Weights   08/12/20 0500 08/14/20 0406 08/15/20 0500  Weight: 99 kg 91.6 kg 92 kg   General:  Adult male sitting upright in bed on HHFNC in mild distress.  He is tachypneic and takes shallow breaths especially when talking.  He  is anxious HEENT: No scleral icterus Neuro: Alert, oriented, MAE CV: rr, no murmur PULM: tachypneic, crackles noted at bilateral lung bases GI: soft, bs active, NT  Extremities: warm/dry, no LE edema   Skin: no rashes   Resolved Hospital Problem list     Assessment & Plan:  Acute hypoxemic respiratory failure secondary to COVID-19 Sepsis Suspecting superimposed bacterial pneumonia Heart failure with reduced ejection fraction Acute bilateral lower extremities  DVTs Hyperkalemia Hyponatremia QT prolongation  Plan:  Acute hypoxemic respiratory failure requiring BiPAP COVID-19 pneumonia Chest x-ray 8/28 shows stable bilateral infiltrates.  Repeat chest x-ray today Currently satting in the low 80s on 30 L heated high flow.  His oxygen goes down where he is talks. Maintain O2 saturations for goal SpO2 > 85% Monitor closely for signs of ventilatory failure such as nasal flaring, accessory muscle use, abdominal paradoxical movements.   Self proning as tolerated Trend inflammatory markers Ongoing aggressive pulmonary hygiene- IS and flutter valve, progress activity as tolerated -> OOB to chair today  Continue baricitinib per pharmacy  Continue morphine at night for sleep  Will try Precedex for anxiety Continue solumedrol taper per pharmacy  Hold diuresis given worsening renal function and negative fluid status Continue combivent 1 puff TID  Heart failure with reduced ejection fraction Echo 8/23 show EF 40 to 45% with regional wall motion abnormalities.   - hold lasix today given worsening renal function  Elevated creatinine Baseline creatinine 0.9.  Creatinine trending up to 1.27 today.  Likely secondary to Lasix versus prerenal.  Will hold Lasix today.  Ensure adequate p.o. intake of fluid. -BMP daily -Trend creatinine  Acute bilateral lower extremities DVT Doppler ultrasound shows acute DVT of LE bilaterally.  No obvious sign of right heart strain on echocardiogram.  D-dimer> 20 -Continue lovenox per pharmacy   Hyperkalemia Potassium is trending up 4.7-5.2-5.3- 5.4 -5.6. Unclear etiology. Differentials to include beta-blocker or respiratory acidosis. - Switch to low potassium diet - Lokelmia to 10 mg BID  Sepsis Lactic acidosis resolved Suspecting superimposed bacterial pneumonia.  Pro-Cal of 6.47 on admission, trend down to 0.38 now. Strep pneumo and Legionella was negative.  Sputum culture was not collected - 7 days cefepime  completed 8/29 - monitor clinically, trend WBC/ fever curve  Hyponatremia - trend BMET  QT prolongation  - tele monitoring, avoid QTc prolonging agents   Best practice:  Diet: Regular Pain/Anxiety/Delirium protocol (if indicated): Precedex as needed VAP protocol (if indicated): N/A DVT prophylaxis: Lovenox GI prophylaxis: Protonix Glucose control: Sliding scale Mobility: Bedrest Code Status: Full Family Communication: Updated spouse Disposition: remains in ICU at this time  Critical care time: 30 min     Doran Stabler, DO Internal Medicine Residency My pager: [phone]

## 2020-08-16 DIAGNOSIS — Z9289 Personal history of other medical treatment: Secondary | ICD-10-CM

## 2020-08-16 DIAGNOSIS — I82419 Acute embolism and thrombosis of unspecified femoral vein: Secondary | ICD-10-CM

## 2020-08-16 HISTORY — DX: Acute embolism and thrombosis of unspecified femoral vein: I82.419

## 2020-08-16 HISTORY — DX: Personal history of other medical treatment: Z92.89

## 2020-08-16 LAB — CBC
HCT: 48.4 % (ref 39.0–52.0)
Hemoglobin: 15.8 g/dL (ref 13.0–17.0)
MCH: 30.8 pg (ref 26.0–34.0)
MCHC: 32.6 g/dL (ref 30.0–36.0)
MCV: 94.3 fL (ref 80.0–100.0)
Platelets: 356 10*3/uL (ref 150–400)
RBC: 5.13 MIL/uL (ref 4.22–5.81)
RDW: 13.7 % (ref 11.5–15.5)
WBC: 16.1 10*3/uL — ABNORMAL HIGH (ref 4.0–10.5)
nRBC: 0 % (ref 0.0–0.2)

## 2020-08-16 LAB — GLUCOSE, CAPILLARY
Glucose-Capillary: 108 mg/dL — ABNORMAL HIGH (ref 70–99)
Glucose-Capillary: 97 mg/dL (ref 70–99)
Glucose-Capillary: 151 mg/dL — ABNORMAL HIGH (ref 70–99)
Glucose-Capillary: 114 mg/dL — ABNORMAL HIGH (ref 70–99)
Glucose-Capillary: 125 mg/dL — ABNORMAL HIGH (ref 70–99)

## 2020-08-16 LAB — BASIC METABOLIC PANEL
Anion gap: 11 (ref 5–15)
BUN: 30 mg/dL — ABNORMAL HIGH (ref 6–20)
CO2: 28 mmol/L (ref 22–32)
Calcium: 8.5 mg/dL — ABNORMAL LOW (ref 8.9–10.3)
Chloride: 95 mmol/L — ABNORMAL LOW (ref 98–111)
Creatinine, Ser: 1.29 mg/dL — ABNORMAL HIGH (ref 0.61–1.24)
GFR calc Af Amer: >60 mL/min (ref >60)
GFR calc non Af Amer: >60 mL/min (ref >60)
Glucose, Bld: 111 mg/dL — ABNORMAL HIGH (ref 70–99)
Potassium: 4.4 mmol/L (ref 3.5–5.1)
Sodium: 134 mmol/L — ABNORMAL LOW (ref 135–145)

## 2020-08-16 LAB — PROTIME-INR
INR: 1.2 (ref 0.8–1.2)
Prothrombin Time: 14.2 seconds (ref 11.4–15.2)

## 2020-08-16 MED ORDER — INSULIN ASPART 100 UNIT/ML ~~LOC~~ SOLN: 0.0000 [IU] | Freq: Every day | SUBCUTANEOUS | Status: DC

## 2020-08-16 MED ORDER — INSULIN ASPART 100 UNIT/ML ~~LOC~~ SOLN: 0.0000 [IU] | Freq: Three times a day (TID) | SUBCUTANEOUS | Status: DC

## 2020-08-16 MED ORDER — ENOXAPARIN SODIUM 100 MG/ML ~~LOC~~ SOLN
95.0000 mg | Freq: Two times a day (BID) | SUBCUTANEOUS | Status: DC
  Administered 2020-08-16 – 2020-08-17 (×3): 95 mg via SUBCUTANEOUS
  Filled 2020-08-16 (×6): qty 0.95

## 2020-08-16 NOTE — Progress Notes (Signed)
Physical Therapy Treatment Patient Details Name: Henry May MRN: [id number] DOB: 10-12-1966 Today's Date: 08/16/2020    History of Present Illness 54 yo unvaccinated male admitted with hypoxia and WOB due to Covid 19 on 8/22 with initial diagnosis 8/12    PT Comments    Pt agreeable to getting OOB to do some standing activity, but tired out quickly during the first and only standing activity trial.  Pt sat EOB for >10 min and participated in a bath and gown change while being coached in efficient breathing techniques    Follow Up Recommendations  Supervision/Assistance - 24 hour;CIR;Other (comment)     Equipment Recommendations  Rolling walker with 5" wheels    Recommendations for Other Services       Precautions / Restrictions Precautions Precaution Comments: watch sats    Mobility  Bed Mobility Overal bed mobility: Needs Assistance Bed Mobility: Supine to Sit;Rolling;Sit to Supine Rolling: Supervision   Supine to sit: Supervision Sit to supine: Supervision      Transfers Overall transfer level: Needs assistance   Transfers: Sit to/from Stand Sit to Stand: Min guard         General transfer comment: Used UE's appropriately,   Ambulation/Gait             General Gait Details: NT today, due to pt's chest was tight and he was tired.  Limited by [facility]   Stairs             Wheelchair Mobility    Modified Rankin (Stroke Patients Only)       Balance     Sitting balance-Leahy Scale: Good       Standing balance-Leahy Scale: Fair Standing balance comment: Pt able to w/shift and march in place without assist, but with hand on tray table for unilateral tasks.  Pt's sats dropped to a low of 83% on 40L at 75% FiO2, but rebounded to 90% fairly quickly.  With NRB at 15L added, sats rose quickly to mid 90%.  At rest before activity pt's sats were 97% on the 40L without the NRB.  RR was in the upper 20's to lower 30's.  Pt needed coaching for  efficient breathing.                            Cognition Arousal/Alertness: Awake/alert Behavior During Therapy: WFL for tasks assessed/performed Overall Cognitive Status:  (NT formally)                                 General Comments: follows direction well, slowed processing with english      Exercises      General Comments General comments (skin integrity, edema, etc.): pt was bathed at EOB and in standing as part of the session of improving endurance.      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 Formulation: With patient Time For Goal Achievement: 08/26/20 Potential to Achieve Goals: Fair Progress towards PT goals: Progressing toward goals    Frequency    Min  3X/week      PT Plan Current plan remains appropriate    Co-evaluation              AM-PAC PT "6 Clicks" Mobility   Outcome Measure  Help needed turning from your back to your side while in a flat bed without using bedrails?: None Help needed moving from lying on your back to sitting on the side of a flat bed without using bedrails?: None Help needed moving to and from a bed to a chair (including a wheelchair)?: A Little Help needed standing up from a chair using your arms (e.g., wheelchair or bedside chair)?: A Little Help needed to walk in hospital room?: A Lot Help needed climbing 3-5 steps with a railing? : A Lot 6 Click Score: 18    End of Session Equipment Utilized During Treatment: Oxygen Activity Tolerance: Patient limited by fatigue Patient left: in bed;with call bell/phone within reach Nurse Communication: Mobility status PT Visit Diagnosis: Other abnormalities of gait and mobility (R26.89);Muscle weakness (generalized)  (M62.81);Difficulty in walking, not elsewhere classified (R26.2)     Time: 4128-7867 PT Time Calculation (min) (ACUTE ONLY): 23 min  Charges:  $Therapeutic Activity: 23-37 mins                     08/16/2020  Jacinto Halim., PT Acute Rehabilitation Services [phone]  (pager) [phone]  (office)   [name] [name] 08/16/2020, 5:53 PM

## 2020-08-16 NOTE — Progress Notes (Signed)
Patient transferred from 664M ICU to 64M ICU room 15, on 40L HHFNC, no complaints of pain or discomfort.

## 2020-08-16 NOTE — Progress Notes (Signed)
NAME:  Henry May, MRN:  [id number], DOB:  November 13, 1966, LOS: 10 ADMISSION DATE:  08/06/2020, CONSULTATION DATE:  08/08/20 REFERRING MD:  Henry May, CHIEF COMPLAINT:  Dyspnea   Brief History   54 y/o M with no significant PMH who presented with increasing shortness of breath. He was diagnosed with Covid-19 on 8/12 and over the last few days his shortness of breath has progressively worsened. O2 sats were 55% on room air on EMS arrival. Chest x-ray with bilateral infiltrates and patient was high flow nasal cannula with EF. PCCM consulted for admission  History of present illness   Henry May 54 year old male with no significant past medical history, non-smoker,  who was diagnosed with Covid-19 on 8/12 along with his wife. He is scheduled to get the Covid-19 vaccine but has not received any doses yet. He reports cough with increasing chest pressure so called on EMS arrival his oxygen was reportedly 55% on room air. He initially improved on BiPAP, chest x-ray with bilateral infiltrates. Creatinine 1.9, glucose 170, bicarb 15, anion gap 25, lactic acid 8.9, WBC 21k, D-dimer >20, LDH 1,073, Ferritin 1,450, CRP 34.    He was initially placed on Bipap, then trial of HFNC. PCCM consulted for increasing WOB on 15L HFNC with non-rebreather.  Pt denies chest pain or recent lower extremity pain or edema  Past Medical History  No past medical history on file  Significant Hospital Events   8/22 admit to hospitalists 8/23 PCCM consult, ICU txfr  8/25 BiPAP and admitted to the ICU  Consults:  PCCM  Procedures:    Significant Diagnostic Tests:  8/22 CXR>>Mild right lung infiltrates with mild to moderate severity left basilar infiltrate.  8/23 ultrasound LE>> acute DVT bilaterally  8/23 echocardiogram>> EF 40 to 45% with regional wall motion abnormality of LV.  Grade 1 diastolic dysfunction.  Severe hypokinesis of LV basal mid inferior wall.  The RV size is mildly enlarged and RA size was  moderately dilated   Micro Data:  8/22 Covid-19>>positive 8/22 BCx2>> neg 8/25 MRSA PCR >> negative   Antimicrobials:  Remdesevir 8/22 >> 8/26 baricitinib 8/25 >> Azithromycin 8/22 >> 8/23 Ceftriaxone 8/22 >> 8/23 Vancomycin 8/23>> 8/25 Cefepime 8/23>> 8/29  Interim history/subjective:  Improved spirits today.accepting this will be long road to recovery but appreciates the stability today. Did have epistaxis overnight preventing niv use.   Objective   Blood pressure (!) 135/95, pulse 90, temperature (!) 97.5 F (36.4 C), temperature source Axillary, resp. rate (!) 28, height 6' (1.829 m), weight 92 kg, SpO2 93 %.    FiO2 (%):  [70 %-85 %] 75 %   Intake/Output Summary (Last 24 hours) at 08/16/2020 1438 Last data filed at 08/16/2020 1100 Gross per 24 hour  Intake 240 ml  Output 745 ml  Net -505 ml   Filed Weights   08/12/20 0500 08/14/20 0406 08/15/20 0500  Weight: 99 kg 91.6 kg 92 kg   General:  Adult male sitting upright in bed on HHFNC in mild distress.  He is tachypneic and takes shallow breaths especially when talking.  He is anxious HEENT: No scleral icterus Neuro: Alert, oriented, MAE CV: rr, no murmur PULM: tachypneic, crackles noted at bilateral lung bases GI: soft, bs active, NT  Extremities: warm/dry, no LE edema   Skin: no rashes   Resolved Hospital Problem list     Assessment & Plan:  Acute hypoxemic respiratory failure secondary to COVID-19 Sepsis Suspecting superimposed bacterial pneumonia Heart failure with reduced ejection fraction Acute  bilateral lower extremities DVTs Hyperkalemia Hyponatremia QT prolongation  Plan:  Acute hypoxemic respiratory failure requiring BiPAP COVID-19 pneumonia Chest x-ray 8/28 shows stable bilateral infiltrates.  Repeat chest x-ray today Currently satting in the low 80s on 30 L heated high flow.  His oxygen goes down where he is talks. Maintain O2 saturations for goal SpO2 > 85% Monitor closely for signs of  ventilatory failure such as nasal flaring, accessory muscle use, abdominal paradoxical movements.   Self proning as tolerated Trend inflammatory markers Ongoing aggressive pulmonary hygiene- IS and flutter valve, progress activity as tolerated -> OOB to chair today  Continue baricitinib per pharmacy  Continue morphine at night for sleep  Will try Precedex for anxiety if needed Continue solumedrol taper per pharmacy  Hold diuresis given worsening renal function and negative fluid status Continue combivent 1 puff TID  Heart failure with reduced ejection fraction Echo 8/23 show EF 40 to 45% with regional wall motion abnormalities.   - hold lasix today given worsening renal function  Elevated creatinine Baseline creatinine 0.9.  Creatinine elevated but stable at 1.2  Likely secondary to Lasix versus prerenal.  Will hold Lasix today.  Ensure adequate p.o. intake of fluid. -BMP daily -Trend creatinine  Acute bilateral lower extremities DVT Doppler ultrasound shows acute DVT of LE bilaterally.  No obvious sign of right heart strain on echocardiogram.  D-dimer> 20 -Continue lovenox per pharmacy   Hyperkalemia -resolved -cont low K diet   Sepsis Lactic acidosis resolved Suspecting superimposed bacterial pneumonia.  Pro-Cal of 6.47 on admission, trend down to 0.38 now. Strep pneumo and Legionella was negative.  Sputum culture was not collected - 7 days cefepime completed 8/29 - monitor clinically, trend WBC/ fever curve  Hyponatremia - trend BMET  QT prolongation  - tele monitoring, avoid QTc prolonging agents   Best practice:  Diet: Regular Pain/Anxiety/Delirium protocol (if indicated): Precedex if needed VAP protocol (if indicated): N/A DVT prophylaxis: Lovenox GI prophylaxis: Protonix Glucose control: Sliding scale Mobility: Bedrest Code Status: Full Family Communication: Updated spouse Disposition: remains in ICU at this time  Critical care time: The patient is  critically ill with multiple organ systems failure and requires high complexity decision making for assessment and support, frequent evaluation and titration of therapies, application of advanced monitoring technologies and extensive interpretation of multiple databases.  Critical care time 32 mins. This represents my time independent of the NPs time taking care of the pt. This is excluding procedures.    Briant Sites DO [facility] 08/16/2020, 2:38 PM

## 2020-08-16 NOTE — Progress Notes (Signed)
eLink Physician-Brief Progress Note Patient Name: Henry May DOB: Dec 10, 1966 MRN: [id number]   Date of Service  08/16/2020  HPI/Events of Note  Patient on PO diet. Currently on Q 4 hour resistant Novolog SSI.   eICU Interventions  Will order: 1. Change to AC/HS resistant Novolog SSI.     Intervention Category Major Interventions: Hyperglycemia - active titration of insulin therapy  Lenell Antu 08/16/2020, 8:11 PM

## 2020-08-17 LAB — CBC
HCT: 44.4 % (ref 39.0–52.0)
Hemoglobin: 14.3 g/dL (ref 13.0–17.0)
MCH: 30.6 pg (ref 26.0–34.0)
MCHC: 32.2 g/dL (ref 30.0–36.0)
MCV: 94.9 fL (ref 80.0–100.0)
Platelets: 321 10*3/uL (ref 150–400)
RBC: 4.68 MIL/uL (ref 4.22–5.81)
RDW: 14 % (ref 11.5–15.5)
WBC: 15 10*3/uL — ABNORMAL HIGH (ref 4.0–10.5)
nRBC: 0 % (ref 0.0–0.2)

## 2020-08-17 LAB — BASIC METABOLIC PANEL
Anion gap: 8 (ref 5–15)
BUN: 29 mg/dL — ABNORMAL HIGH (ref 6–20)
CO2: 27 mmol/L (ref 22–32)
Calcium: 8.4 mg/dL — ABNORMAL LOW (ref 8.9–10.3)
Chloride: 101 mmol/L (ref 98–111)
Creatinine, Ser: 1.25 mg/dL — ABNORMAL HIGH (ref 0.61–1.24)
GFR calc Af Amer: >60 mL/min (ref >60)
GFR calc non Af Amer: >60 mL/min (ref >60)
Glucose, Bld: 99 mg/dL (ref 70–99)
Potassium: 5.3 mmol/L — ABNORMAL HIGH (ref 3.5–5.1)
Sodium: 136 mmol/L (ref 135–145)

## 2020-08-17 LAB — GLUCOSE, CAPILLARY
Glucose-Capillary: 108 mg/dL — ABNORMAL HIGH (ref 70–99)
Glucose-Capillary: 75 mg/dL (ref 70–99)
Glucose-Capillary: 81 mg/dL (ref 70–99)
Glucose-Capillary: 91 mg/dL (ref 70–99)
Glucose-Capillary: 96 mg/dL (ref 70–99)

## 2020-08-17 LAB — PROTIME-INR
INR: 1.2 (ref 0.8–1.2)
Prothrombin Time: 14.5 seconds (ref 11.4–15.2)

## 2020-08-17 NOTE — Progress Notes (Signed)
NAME:  Henry May, MRN:  [id number], DOB:  [birth year], LOS: 11 ADMISSION DATE:  08/06/2020, CONSULTATION DATE:  08/08/20 REFERRING MD:  Toniann Fail, CHIEF COMPLAINT:  Dyspnea   Brief History   54 y/o M with no significant PMH who presented with increasing shortness of breath. He was diagnosed with Covid-19 on 8/12 and over the last few days his shortness of breath has progressively worsened. O2 sats were 55% on room air on EMS arrival. Chest x-ray with bilateral infiltrates and patient was high flow nasal cannula with EF. PCCM consulted for admission  History of present illness   Henry May 54 year old male with no significant past medical history, non-smoker,  who was diagnosed with Covid-19 on 8/12 along with his wife. He is scheduled to get the Covid-19 vaccine but has not received any doses yet. He reports cough with increasing chest pressure so called on EMS arrival his oxygen was reportedly 55% on room air. He initially improved on BiPAP, chest x-ray with bilateral infiltrates. Creatinine 1.9, glucose 170, bicarb 15, anion gap 25, lactic acid 8.9, WBC 21k, D-dimer >20, LDH 1,073, Ferritin 1,450, CRP 34.    He was initially placed on Bipap, then trial of HFNC. PCCM consulted for increasing WOB on 15L HFNC with non-rebreather.  Pt denies chest pain or recent lower extremity pain or edema  Past Medical History  No past medical history on file  Significant Hospital Events   8/22 admit to hospitalists 8/23 PCCM consult, ICU txfr  8/25 BiPAP and admitted to the ICU  Consults:  PCCM  Procedures:    Significant Diagnostic Tests:  8/22 CXR>>Mild right lung infiltrates with mild to moderate severity left basilar infiltrate.  8/23 ultrasound LE>> acute DVT bilaterally  8/23 echocardiogram>> EF 40 to 45% with regional wall motion abnormality of LV.  Grade 1 diastolic dysfunction.  Severe hypokinesis of LV basal mid inferior wall.  The RV size is mildly enlarged and RA size was  moderately dilated   Micro Data:  8/22 Covid-19>>positive 8/22 BCx2>> neg 8/25 MRSA PCR >> negative   Antimicrobials:  Remdesevir 8/22 >> 8/26 baricitinib 8/25 >> Azithromycin 8/22 >> 8/23 Ceftriaxone 8/22 >> 8/23 Vancomycin 8/23>> 8/25 Cefepime 8/23>> 8/29  Interim history/subjective:  No acute event overnight. Complains of wearing NRB mask. Otherwise without complaints.   Objective   Blood pressure 136/85, pulse 82, temperature 97.7 F (36.5 C), temperature source Axillary, resp. rate (!) 28, height 6' (1.829 m), weight 90.2 kg, SpO2 94 %.    FiO2 (%):  [70 %-80 %] 80 %   Intake/Output Summary (Last 24 hours) at 08/17/2020 1302 Last data filed at 08/17/2020 0800 Gross per 24 hour  Intake --  Output 950 ml  Net -950 ml   Filed Weights   08/14/20 0406 08/15/20 0500 08/17/20 0500  Weight: 91.6 kg 92 kg 90.2 kg   General:  Adult male sitting upright in bed on HHFNC and NRB.  He is anxious HEENT: No scleral icterus Neuro: Alert, oriented, moves all extremities CV: rrr, no murmur PULM: tachypneic, crackles noted at bilateral lung bases GI: soft, bs active, NT  Extremities: warm/dry, no LE edema   Skin: no rashes   Resolved Hospital Problem list     Assessment & Plan:  Acute hypoxemic respiratory failure secondary to COVID-19 Sepsis Suspecting superimposed bacterial pneumonia Heart failure with reduced ejection fraction Acute bilateral lower extremities DVTs Hyperkalemia Hyponatremia QT prolongation  Plan:  Acute hypoxemic respiratory failure secondary to COVID-19 pneumonia - Continue HFNC and  NRB, bipap if needed - Maintain O2 saturations for goal SpO2 > 85% - Continue baricitinib per pharmacy  - Continue morphine at night for sleep  - Will try Precedex for anxiety if needed - Continue solumedrol taper per pharmacy  - Continue combivent 1 puff TID  Heart failure with reduced ejection fraction Echo 8/23 show EF 40 to 45% with regional wall motion  abnormalities.   - hold lasix today  Elevated creatinine Baseline creatinine 0.9.  Creatinine elevated but stable at 1.25  Likely secondary to Lasix versus prerenal.   - Ensure adequate p.o. intake of fluid. - BMP daily - Trend creatinine  Acute bilateral lower extremities DVT Doppler ultrasound shows acute DVT of LE bilaterally.  No obvious sign of right heart strain on echocardiogram.  D-dimer> 20 -Continue therapetuic lovenox per pharmacy   Sepsis Lactic acidosis resolved Suspecting superimposed bacterial pneumonia.  Pro-Cal of 6.47 on admission, trend down to 0.38 now. Strep pneumo and Legionella was negative.  Sputum culture was not collected - 7 days cefepime completed 8/29 - monitor clinically, trend WBC/ fever curve  Hyponatremia - trend BMET  QT prolongation  - tele monitoring, avoid QTc prolonging agents   Best practice:  Diet: Regular Pain/Anxiety/Delirium protocol (if indicated): Precedex if needed VAP protocol (if indicated): N/A DVT prophylaxis: Lovenox GI prophylaxis: Protonix Glucose control: Sliding scale Mobility: Bedrest Code Status: Full Family Communication: Will update family today Disposition: remains in ICU at this time  Critical care time: The patient is critically ill with multiple organ systems failure and requires high complexity decision making for assessment and support, frequent evaluation and titration of therapies, application of advanced monitoring technologies and extensive interpretation of multiple databases.  Critical care time 35 mins. This represents my time independent of the NPs time taking care of the pt. This is excluding procedures.    Melody Comas, MD [facility] Office: [phone]   See Amion for Pager Details

## 2020-08-18 ENCOUNTER — Inpatient Hospital Stay (HOSPITAL_COMMUNITY): Payer: BC Managed Care – PPO

## 2020-08-18 LAB — GLUCOSE, CAPILLARY
Glucose-Capillary: 105 mg/dL — ABNORMAL HIGH (ref 70–99)
Glucose-Capillary: 108 mg/dL — ABNORMAL HIGH (ref 70–99)
Glucose-Capillary: 109 mg/dL — ABNORMAL HIGH (ref 70–99)
Glucose-Capillary: 86 mg/dL (ref 70–99)
Glucose-Capillary: 92 mg/dL (ref 70–99)
Glucose-Capillary: 92 mg/dL (ref 70–99)

## 2020-08-18 LAB — BASIC METABOLIC PANEL
Anion gap: 7 (ref 5–15)
BUN: 25 mg/dL — ABNORMAL HIGH (ref 6–20)
CO2: 27 mmol/L (ref 22–32)
Calcium: 8.4 mg/dL — ABNORMAL LOW (ref 8.9–10.3)
Chloride: 101 mmol/L (ref 98–111)
Creatinine, Ser: 1.09 mg/dL (ref 0.61–1.24)
GFR calc Af Amer: >60 mL/min (ref >60)
GFR calc non Af Amer: >60 mL/min (ref >60)
Glucose, Bld: 88 mg/dL (ref 70–99)
Potassium: 4.9 mmol/L (ref 3.5–5.1)
Sodium: 135 mmol/L (ref 135–145)

## 2020-08-18 LAB — CBC
HCT: 43.9 % (ref 39.0–52.0)
Hemoglobin: 14.3 g/dL (ref 13.0–17.0)
MCH: 31.2 pg (ref 26.0–34.0)
MCHC: 32.6 g/dL (ref 30.0–36.0)
MCV: 95.6 fL (ref 80.0–100.0)
Platelets: 310 10*3/uL (ref 150–400)
RBC: 4.59 MIL/uL (ref 4.22–5.81)
RDW: 13.9 % (ref 11.5–15.5)
WBC: 16.9 10*3/uL — ABNORMAL HIGH (ref 4.0–10.5)
nRBC: 0 % (ref 0.0–0.2)

## 2020-08-18 LAB — PROTIME-INR
INR: 1.1 (ref 0.8–1.2)
Prothrombin Time: 14.2 seconds (ref 11.4–15.2)

## 2020-08-18 LAB — C-REACTIVE PROTEIN: CRP: 1.8 mg/dL — ABNORMAL HIGH (ref <1.0)

## 2020-08-18 MED ORDER — ADULT MULTIVITAMIN W/MINERALS CH
1.0000 | ORAL_TABLET | Freq: Every day | ORAL | Status: DC
  Administered 2020-08-19 – 2020-08-22 (×4): 1 via ORAL
  Filled 2020-08-18 (×5): qty 1

## 2020-08-18 MED ORDER — ENOXAPARIN SODIUM 100 MG/ML ~~LOC~~ SOLN
90.0000 mg | Freq: Two times a day (BID) | SUBCUTANEOUS | Status: DC
  Administered 2020-08-18 – 2020-08-20 (×5): 90 mg via SUBCUTANEOUS
  Filled 2020-08-18 (×2): qty 1
  Filled 2020-08-18: qty 0.9
  Filled 2020-08-18 (×2): qty 1
  Filled 2020-08-18 (×2): qty 0.9

## 2020-08-18 MED ORDER — INSULIN ASPART 100 UNIT/ML ~~LOC~~ SOLN: 0.0000 [IU] | Freq: Every day | SUBCUTANEOUS | Status: DC

## 2020-08-18 MED ORDER — INSULIN ASPART 100 UNIT/ML ~~LOC~~ SOLN
0.0000 [IU] | Freq: Three times a day (TID) | SUBCUTANEOUS | Status: DC
  Administered 2020-08-20: 3 [IU] via SUBCUTANEOUS
  Administered 2020-08-20: 2 [IU] via SUBCUTANEOUS
  Administered 2020-08-21: 3 [IU] via SUBCUTANEOUS
  Administered 2020-08-21: 2 [IU] via SUBCUTANEOUS
  Administered 2020-08-22 – 2020-08-23 (×5): 3 [IU] via SUBCUTANEOUS

## 2020-08-18 MED ORDER — ENSURE ENLIVE PO LIQD
237.0000 mL | Freq: Three times a day (TID) | ORAL | Status: DC
  Administered 2020-08-19 – 2020-08-21 (×5): 237 mL via ORAL

## 2020-08-18 NOTE — Progress Notes (Signed)
Initial Nutrition Assessment  DOCUMENTATION CODES:   Not applicable  INTERVENTION:    Offer Ensure Enlive po TID, each supplement provides 350 kcal and 20 grams of protein.  MVI with minerals daily.  Double protein portions with meals.  Recommend liberalize diet to regular to increase meal options, as the renal diet is very restrictive. If potassium becomes elevated, consider potassium lowering medication.   NUTRITION DIAGNOSIS:   Increased nutrient needs related to catabolic illness (COVID-19) as evidenced by estimated needs.  GOAL:   Patient will meet greater than or equal to 90% of their needs  MONITOR:   PO intake, Supplement acceptance  REASON FOR ASSESSMENT:   LOS    ASSESSMENT:   54 yo male with no significant PMH admitted with increasing SOB, COVID positive on 8/12.   Unable to speak with patient at this time or complete NFPE.  Currently on 30 L HFNC. Intake of meals since admission has been variable, likely related to COVID symptoms, including difficulty breathing.   Labs reviewed.  CBG: 92-86 Medications reviewed.  Patient has had 6% weight loss since admission to the hospital, could be related to fluids. Currently 90.2 kg I/O -7 L since admission UOP 575 ml x 24 hours  Diet was changed from regular to renal on 8/31 due to elevated potassium level.  Renal diet is very restrictive, patient will need supplements to meet increased nutrition needs. Recommend change back to regular diet to increase meal options.   NUTRITION - FOCUSED PHYSICAL EXAM:  unable to complete  Diet Order:   Diet Order            Diet renal with fluid restriction Fluid restriction: 2000 mL Fluid; Room service appropriate? Yes; Fluid consistency: Thin  Diet effective now                 EDUCATION NEEDS:   No education needs have been identified at this time  Skin:  Skin Assessment: Reviewed RN Assessment  Last BM:  8/31  Height:   Ht Readings from Last 1  Encounters:  08/09/20 6' (1.829 m)    Weight:   Wt Readings from Last 1 Encounters:  08/17/20 90.2 kg    Ideal Body Weight:  80.9 kg  BMI:  Body mass index is 26.97 kg/m.  Estimated Nutritional Needs:   Kcal:  2500-2800  Protein:  125-150 gm  Fluid:  >/= 2.5    Gabriel Rainwater, RD, LDN, CNSC Please refer to Amion for contact information.

## 2020-08-18 NOTE — Plan of Care (Signed)
Patient has orders to progress care.

## 2020-08-18 NOTE — Progress Notes (Signed)
NAME:  Henry May, MRN:  [id number], DOB:  [birth year], LOS: 12 ADMISSION DATE:  08/06/2020, CONSULTATION DATE:  08/08/20 REFERRING MD:  Toniann Fail, CHIEF COMPLAINT:  Dyspnea   Brief History   54 y/o M with no significant PMH who presented with increasing shortness of breath. He was diagnosed with Covid-19 on 8/12 and over the last few days his shortness of breath has progressively worsened. O2 sats were 55% on room air on EMS arrival. Chest x-ray with bilateral infiltrates and patient was high flow nasal cannula with EF. PCCM consulted for admission  History of present illness   Henry May 54 year old male with no significant past medical history, non-smoker,  who was diagnosed with Covid-19 on 8/12 along with his wife. He is scheduled to get the Covid-19 vaccine but has not received any doses yet. He reports cough with increasing chest pressure so called on EMS arrival his oxygen was reportedly 55% on room air. He initially improved on BiPAP, chest x-ray with bilateral infiltrates. Creatinine 1.9, glucose 170, bicarb 15, anion gap 25, lactic acid 8.9, WBC 21k, D-dimer >20, LDH 1,073, Ferritin 1,450, CRP 34.    He was initially placed on Bipap, then trial of HFNC. PCCM consulted for increasing WOB on 15L HFNC with non-rebreather.  Pt denies chest pain or recent lower extremity pain or edema  Past Medical History  No past medical history on file  Significant Hospital Events   8/22 admit to hospitalists 8/23 PCCM consult, ICU txfr  8/25 BiPAP and admitted to the ICU  Consults:  PCCM  Procedures:    Significant Diagnostic Tests:  8/22 CXR>>Mild right lung infiltrates with mild to moderate severity left basilar infiltrate.  8/23 ultrasound LE>> acute DVT bilaterally  8/23 echocardiogram>> EF 40 to 45% with regional wall motion abnormality of LV.  Grade 1 diastolic dysfunction.  Severe hypokinesis of LV basal mid inferior wall.  The RV size is mildly enlarged and RA size was  moderately dilated   Micro Data:  8/22 Covid-19>>positive 8/22 BCx2>> neg 8/25 MRSA PCR >> negative   Antimicrobials:  Remdesevir 8/22 >> 8/26 baricitinib 8/25 >> Azithromycin 8/22 >> 8/23 Ceftriaxone 8/22 >> 8/23 Vancomycin 8/23>> 8/25 Cefepime 8/23>> 8/29  Interim history/subjective:  No acute event overnight. Remains stable on HFNC and NRB. No requirement for bipap.   Objective   Blood pressure (!) 122/91, pulse (!) 102, temperature 98.5 F (36.9 C), temperature source Axillary, resp. rate (!) 35, height 6' (1.829 m), weight 90.2 kg, SpO2 (!) 89 %.    FiO2 (%):  [80 %] 80 %   Intake/Output Summary (Last 24 hours) at 08/18/2020 1157 Last data filed at 08/17/2020 1500 Gross per 24 hour  Intake 600 ml  Output 275 ml  Net 325 ml   Filed Weights   08/14/20 0406 08/15/20 0500 08/17/20 0500  Weight: 91.6 kg 92 kg 90.2 kg   General:  Sitting upright in bed on HHFNC and NRB.  He is comfortable. HEENT: No scleral icterus Neuro: Alert, oriented, moves all extremities CV: rrr, no murmur PULM: mildly tachypneic, crackles noted at bilateral lung bases GI: soft, bs active, NT  Extremities: warm/dry, no LE edema   Skin: no rashes   Resolved Hospital Problem list     Assessment & Plan:   Acute hypoxemic respiratory failure secondary to COVID-19 pneumonia - Continue HFNC and NRB - Maintain O2 saturations for goal SpO2 > 85% - Continue baricitinib per pharmacy  - Continue morphine at night for sleep  -  Continue solumedrol taper per pharmacy  - Continue combivent 1 puff TID  Heart failure with reduced ejection fraction Echo 8/23 show EF 40 to 45% with regional wall motion abnormalities.   - No acute need for diuresis  Elevated creatinine Baseline creatinine 0.9 elevated to 1.25 on 9/2 and down trending today.  Likely secondary to Lasix versus prerenal.   - Ensure adequate p.o. intake of fluid. - BMP daily - Trend creatinine  Acute bilateral lower extremities  DVT Doppler ultrasound shows acute DVT of LE bilaterally.  No obvious sign of right heart strain on echocardiogram.  D-dimer> 20 -Continue therapetuic lovenox per pharmacy   Sepsis Lactic acidosis resolved Suspecting superimposed bacterial pneumonia.  Pro-Cal of 6.47 on admission, trend down to 0.38. Strep pneumo and Legionella was negative.  Sputum culture was not collected - 7 days cefepime completed 8/29 - monitor clinically, trend WBC/ fever curve  Hyponatremia - trend BMET  QT prolongation  - tele monitoring, avoid QTc prolonging agents   Best practice:  Diet: Regular Pain/Anxiety/Delirium protocol (if indicated): Precedex if needed VAP protocol (if indicated): N/A DVT prophylaxis: Lovenox GI prophylaxis: Protonix Glucose control: Sliding scale Mobility: Bedrest Code Status: Full Family Communication: Will update family today Disposition: ICU, given stabel O2 requirement on HFNC and NRB we will plan to transfer him to [facility]  Critical care time: The patient is critically ill with multiple organ systems failure and requires high complexity decision making for assessment and support, frequent evaluation and titration of therapies, application of advanced monitoring technologies and extensive interpretation of multiple databases.   Critical care time 30 mins. This is excluding procedures.   Melody Comas, MD [facility] Office: [phone]   See Amion for Pager Details

## 2020-08-19 DIAGNOSIS — I82403 Acute embolism and thrombosis of unspecified deep veins of lower extremity, bilateral: Secondary | ICD-10-CM

## 2020-08-19 DIAGNOSIS — R079 Chest pain, unspecified: Secondary | ICD-10-CM

## 2020-08-19 LAB — CBC
HCT: 44.6 % (ref 39.0–52.0)
Hemoglobin: 14.3 g/dL (ref 13.0–17.0)
MCH: 30.3 pg (ref 26.0–34.0)
MCHC: 32.1 g/dL (ref 30.0–36.0)
MCV: 94.5 fL (ref 80.0–100.0)
Platelets: 304 10*3/uL (ref 150–400)
RBC: 4.72 MIL/uL (ref 4.22–5.81)
RDW: 13.7 % (ref 11.5–15.5)
WBC: 15.1 10*3/uL — ABNORMAL HIGH (ref 4.0–10.5)
nRBC: 0 % (ref 0.0–0.2)

## 2020-08-19 LAB — BASIC METABOLIC PANEL
Anion gap: 9 (ref 5–15)
BUN: 16 mg/dL (ref 6–20)
CO2: 24 mmol/L (ref 22–32)
Calcium: 8.3 mg/dL — ABNORMAL LOW (ref 8.9–10.3)
Chloride: 99 mmol/L (ref 98–111)
Creatinine, Ser: 0.96 mg/dL (ref 0.61–1.24)
GFR calc Af Amer: >60 mL/min (ref >60)
GFR calc non Af Amer: >60 mL/min (ref >60)
Glucose, Bld: 104 mg/dL — ABNORMAL HIGH (ref 70–99)
Potassium: 4 mmol/L (ref 3.5–5.1)
Sodium: 132 mmol/L — ABNORMAL LOW (ref 135–145)

## 2020-08-19 LAB — PROTIME-INR
INR: 1.2 (ref 0.8–1.2)
Prothrombin Time: 14.4 seconds (ref 11.4–15.2)

## 2020-08-19 LAB — GLUCOSE, CAPILLARY
Glucose-Capillary: 106 mg/dL — ABNORMAL HIGH (ref 70–99)
Glucose-Capillary: 92 mg/dL (ref 70–99)
Glucose-Capillary: 106 mg/dL — ABNORMAL HIGH (ref 70–99)
Glucose-Capillary: 96 mg/dL (ref 70–99)
Glucose-Capillary: 118 mg/dL — ABNORMAL HIGH (ref 70–99)

## 2020-08-19 LAB — BRAIN NATRIURETIC PEPTIDE: B Natriuretic Peptide: 419.4 pg/mL — ABNORMAL HIGH (ref 0.0–100.0)

## 2020-08-19 MED ORDER — FUROSEMIDE 10 MG/ML IJ SOLN
40.0000 mg | Freq: Once | INTRAMUSCULAR | Status: AC
  Administered 2020-08-19: 40 mg via INTRAVENOUS
  Filled 2020-08-19: qty 4

## 2020-08-19 NOTE — Progress Notes (Addendum)
PROGRESS NOTE    Henry May  QIO:[id number] DOB: [birth year] DOA: 08/06/2020 PCP: Patient, No Pcp Per   Brief Narrative:  53 year old without any known past medical history initially diagnosed with COVID-19 on 8/12 became progressively short of breath and hypoxia therefore came to the hospital.  Briefly admitted to hospitalist service then transferred to the ICU requiring BiPAP.  Ultrasound lower extremity showed bilateral DVT, echocardiogram showed EF of 40-45%, grade 1 diastolic dysfunction with severe LV hypokinesia.  Completed course of remdesivir.  Still on baricitinib.  Received a course of antibiotics as well.   Assessment & Plan:   Principal Problem:   Acute respiratory failure due to COVID-19 [facility]) Active Problems:   ARF (acute renal failure) (HCC)   Elevated LFTs   Acute respiratory disease due to COVID-19 virus   Pneumonia due to COVID-19 virus   Acute hypoxemic respiratory failure due to COVID-19 [facility])   Acute hypoxic respiratory failure secondary to COVID-19 pneumonia -Patient is still hypoxic. Still component for both Infiltrative disease -Completed course of remdesivir -Currently on baricitinib 4 mg daily -Solu-Medrol tapered off- last dose 9/1 -Combivent 3 times daily, incentive spirometer, flutter valve  Congestive heart failure with reduced ejection fraction, 40% with wall motion abnormality -Not in acute distress, currently not on diuretics regular diuretics. Elevated BNP therefore a dose of Lasix 40mg  IV ordered.   Acute bilateral lower extremity DVT -Currently on Lovenox dosed by pharmacy  Tachycardia -Suspect secondary to underlying ongoing pulmonary infiltrative process and high suspicion for pulmonary embolism.  Already on treatment for both As mentioned above. -On telemetry appears to be sinus tachycardia, obtaining EKG  Sepsis secondary to superimposed multifocal bacterial pneumonia -This is resolved.  Completed 7-day course of  antibiotics-cefepime; completed 5/29  QT prolongation EKG has been ordered..   Essential hypertension -Norvasc 5 mg daily  DVT prophylaxis: Treatment dose of Lovenox Code Status: Full code Family Communication: Patient's wife been updated by me  Status is: Inpatient  Remains inpatient appropriate because:Inpatient level of care appropriate due to severity of illness   Dispo: The patient is from: Home              Anticipated d/c is to: Home              Anticipated d/c date is: > 3 days              Patient currently is not medically stable to d/c.  Patient is still severely hypoxic requiring inpatient management.  Unsafe for discharge at this time.   Body mass index is 26.49 kg/m.            Subjective: Seen and examined at bedside, he reports of exertional shortness of breath and some pleuritic chest pain but his symptoms has neither improved nor worsened march over last 24 hours.  He tried to sit out in the chair yesterday for 2 hours.  He is able to use incentive spirometer, brings it up to's about 700 cc.  No other new complaints at this time  Review of Systems Otherwise negative except as per HPI, including: General: Denies fever, chills, night sweats or unintended weight loss. Resp: Denies cough, wheezing, shortness of breath. Cardiac: Denies chest pain, palpitations, orthopnea, paroxysmal nocturnal dyspnea. GI: Denies abdominal pain, nausea, vomiting, diarrhea or constipation GU: Denies dysuria, frequency, hesitancy or incontinence MS: Denies muscle aches, joint pain or swelling Neuro: Denies headache, neurologic deficits (focal weakness, numbness, tingling), abnormal gait Psych: Denies anxiety, depression, SI/HI/AVH Skin: Denies new  rashes or lesions ID: Denies sick contacts, exotic exposures, travel  Examination:  General exam: Generally appears ill but not in any acute distress, remains on heated high flow. Respiratory system: Bilateral diffuse coarse  breath sounds Cardiovascular system: S1 & S2 heard, RRR. No JVD, murmurs, rubs, gallops or clicks. No pedal edema. Gastrointestinal system: Abdomen is nondistended, soft and nontender. No organomegaly or masses felt. Normal bowel sounds heard. Central nervous system: Alert and oriented. No focal neurological deficits. Extremities: Symmetric 5 x 5 power. Skin: No rashes, lesions or ulcers Psychiatry: Judgement and insight appear normal. Mood & affect appropriate.     Objective: Vitals:   08/19/20 0300 08/19/20 0400 08/19/20 0500 08/19/20 0616  BP: 125/84 118/86  114/81  Pulse: (!) 112 98  99  Resp: (!) 31 (!) 29  (!) 29  Temp:   97.7 F (36.5 C) 97.7 F (36.5 C)  TempSrc:   Axillary Axillary  SpO2: 91% 93%  (!) 89%  Weight:    88.6 kg  Height:        Intake/Output Summary (Last 24 hours) at 08/19/2020 0743 Last data filed at 08/19/2020 0400 Gross per 24 hour  Intake 0 ml  Output 600 ml  Net -600 ml   Filed Weights   08/15/20 0500 08/17/20 0500 08/19/20 0616  Weight: 92 kg 90.2 kg 88.6 kg     Data Reviewed:   CBC: Recent Labs  Lab 08/15/20 0053 08/16/20 0312 08/17/20 0234 08/18/20 0229 08/19/20 0134  WBC 15.8* 16.1* 15.0* 16.9* 15.1*  HGB 16.6 15.8 14.3 14.3 14.3  HCT 52.4* 48.4 44.4 43.9 44.6  MCV 94.2 94.3 94.9 95.6 94.5  PLT 372 356 321 310 304   Basic Metabolic Panel: Recent Labs  Lab 08/15/20 0053 08/16/20 0312 08/17/20 0234 08/18/20 0229 08/19/20 0134  NA 135 134* 136 135 132*  K 5.6* 4.4 5.3* 4.9 4.0  CL 96* 95* 101 101 99  CO2 27 28 27 27 24   GLUCOSE 118* 111* 99 88 104*  BUN 42* 30* 29* 25* 16  CREATININE 1.27* 1.29* 1.25* 1.09 0.96  CALCIUM 8.9 8.5* 8.4* 8.4* 8.3*   GFR: Estimated Creatinine Clearance: 94.3 mL/min (by C-G formula based on SCr of 0.96 mg/dL). Liver Function Tests: No results for input(s): AST, ALT, ALKPHOS, BILITOT, PROT, ALBUMIN in the last 168 hours. No results for input(s): LIPASE, AMYLASE in the last 168 hours. No  results for input(s): AMMONIA in the last 168 hours. Coagulation Profile: Recent Labs  Lab 08/15/20 0053 08/16/20 0312 08/17/20 0234 08/18/20 0229 08/19/20 0134  INR 1.2 1.2 1.2 1.1 1.2   Cardiac Enzymes: No results for input(s): CKTOTAL, CKMB, CKMBINDEX, TROPONINI in the last 168 hours. BNP (last 3 results) No results for input(s): PROBNP in the last 8760 hours. HbA1C: No results for input(s): HGBA1C in the last 72 hours. CBG: Recent Labs  Lab 08/18/20 1121 08/18/20 1556 08/18/20 2023 08/18/20 2354 08/19/20 0456  GLUCAP 109* 108* 105* 92 106*   Lipid Profile: No results for input(s): CHOL, HDL, LDLCALC, TRIG, CHOLHDL, LDLDIRECT in the last 72 hours. Thyroid Function Tests: No results for input(s): TSH, T4TOTAL, FREET4, T3FREE, THYROIDAB in the last 72 hours. Anemia Panel: No results for input(s): VITAMINB12, FOLATE, FERRITIN, TIBC, IRON, RETICCTPCT in the last 72 hours. Sepsis Labs: No results for input(s): PROCALCITON, LATICACIDVEN in the last 168 hours.  No results found for this or any previous visit (from the past 240 hour(s)).       Radiology Studies: DG CHEST  PORT 1 VIEW  Result Date: 08/18/2020 CLINICAL DATA:  Respiratory failure. EXAM: PORTABLE CHEST 1 VIEW COMPARISON:  August 15, 2020. FINDINGS: Stable cardiomegaly. No pneumothorax or pleural effusion is noted. Stable bilateral patchy airspace opacities are noted consistent with multifocal pneumonia. Bony thorax is unremarkable. IMPRESSION: Stable bilateral multifocal pneumonia. Electronically Signed   By: Lupita Raider M.D.   On: 08/18/2020 09:18        Scheduled Meds: . amLODipine  5 mg Oral Daily  . baricitinib  4 mg Oral Daily  . Chlorhexidine Gluconate Cloth  6 each Topical Daily  . enoxaparin (LOVENOX) injection  90 mg Subcutaneous Q12H  . feeding supplement (ENSURE ENLIVE)  237 mL Oral TID BM  . fluticasone  2 spray Each Nare Daily  . insulin aspart  0-15 Units Subcutaneous TID WC  .  insulin aspart  0-5 Units Subcutaneous QHS  . Ipratropium-Albuterol  1 puff Inhalation TID  . multivitamin with minerals  1 tablet Oral Daily  . pantoprazole  40 mg Oral Daily  . polyethylene glycol  17 g Oral Daily   Continuous Infusions: . sodium chloride Stopped (08/12/20 2259)     LOS: 13 days   Time spent= 35 mins    Shareece Bultman Joline Maxcy, MD [facility]  If 7PM-7AM, please contact night-coverage  08/19/2020, 7:43 AM

## 2020-08-19 NOTE — Progress Notes (Signed)
Physical Therapy Treatment Patient Details Name: Henry May MRN: [id number] DOB: March 23, 1966 Today's Date: 08/19/2020    History of Present Illness 54 yo unvaccinated male admitted with hypoxia and WOB due to Covid 19 on 8/22 with initial diagnosis 8/12    PT Comments    Pt starting session at SpO2 of 92% on 30L HHFNC at 55% FiO2.  All was promising for trying standing trial (s) with low level activity, but on sitting, sats started to drop into the mid to upper 70's and would not recover with coaching for purse-lipped/efficient breathing.  After ~10 min, pt asked to return to supine where his slowly started to recover into the mid 80's   Follow Up Recommendations  Supervision/Assistance - 24 hour;CIR;Other (comment)     Equipment Recommendations  Rolling walker with 5" wheels    Recommendations for Other Services       Precautions / Restrictions Precautions Precaution Comments: watch sats    Mobility  Bed Mobility   Bed Mobility: Supine to Sit;Sit to Supine Rolling: Supervision   Supine to sit: Supervision Sit to supine: Supervision   General bed mobility comments: pt moving well, but pt unwilling to attempt standing and truly, sats with in the upper 70's with coaching and rr was rising in the 40's.  Transfers                    Ambulation/Gait                 Stairs             Wheelchair Mobility    Modified Rankin (Stroke Patients Only)       Balance                                            Cognition Arousal/Alertness: Awake/alert Behavior During Therapy: WFL for tasks assessed/performed Overall Cognitive Status: Impaired/Different from baseline (NT formally) Area of Impairment: Safety/judgement                                      Exercises Other Exercises Other Exercises: worked on coaching with efficient breathing technique which today did not help significantly.  Therefore pt returned  to supine where SpO2 slowly improved.    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 PT Goal Formulation: With patient Time For Goal Achievement: 08/26/20 Potential to Achieve Goals: Fair Progress towards PT goals: Not progressing toward goals - comment (pt unable to sustain SpO2 for standing trials)    Frequency    Min 3X/week      PT Plan Current plan remains appropriate    Co-evaluation              AM-PAC PT "6 Clicks" Mobility   Outcome Measure  Help needed turning from your back to your side while in a flat bed without using bedrails?: None Help needed moving from lying on your back to sitting on the side of a flat bed without using bedrails?: None Help needed moving to and from a bed to a chair (including a wheelchair)?: A Little Help needed standing up from a chair using your arms (e.g., wheelchair or bedside chair)?: A Little Help needed to walk in hospital room?: A Lot Help needed climbing 3-5 steps with a railing? : A Lot 6 Click Score: 18    End of Session   Activity Tolerance: Patient limited by fatigue Patient left: in bed;with call bell/phone within reach Nurse Communication: Mobility status PT Visit Diagnosis: Other abnormalities of gait and mobility (R26.89);Muscle weakness (generalized) (M62.81);Difficulty in walking, not elsewhere classified (R26.2)     Time: 1216-2446 PT Time Calculation (min) (ACUTE ONLY): 18 min  Charges:  $Therapeutic Activity: 8-22 mins                     08/19/2020  Henry May., PT Acute Rehabilitation Services [phone]   (pager) [phone]  (office)   Henry May 08/19/2020, 6:25 PM

## 2020-08-19 NOTE — Progress Notes (Signed)
  Inpatient Rehab Admissions Coordinator Note:   Per therapy recommendations, pt was screened for CIR candidacy by Megan Salon, MS CCC-SLP. Pt. Currently Supervision-min guard for bed mobility and transfers with no gait attempted. Will follow and monitor for progress and place CIR consult order if Pt. Appears to be an appropriate candidate.     Megan Salon, MS, CCC-SLP Rehab Admissions Coordinator  [phone] (celll) [phone] (office)

## 2020-08-19 NOTE — Progress Notes (Signed)
ANTICOAGULATION CONSULT NOTE  Pharmacy Consult for lovenox Indication: DVT  No Known Allergies  Patient Measurements: Height: 6' (182.9 cm) Weight: 88.6 kg (195 lb 5.2 oz) IBW/kg (Calculated) : 77.6  Vital Signs: Temp: 99.1 F (37.3 C) (09/04 0751) Temp Source: Axillary (09/04 0751) BP: 119/85 (09/04 0751) Pulse Rate: 130 (09/04 0751)  Labs: Recent Labs    08/17/20 0234 08/17/20 0234 08/18/20 0229 08/19/20 0134  HGB 14.3   < > 14.3 14.3  HCT 44.4  --  43.9 44.6  PLT 321  --  310 304  LABPROT 14.5  --  14.2 14.4  INR 1.2  --  1.1 1.2  CREATININE 1.25*  --  1.09 0.96   < > = values in this interval not displayed.    Estimated Creatinine Clearance: 94.3 mL/min (by C-G formula based on SCr of 0.96 mg/dL).   Medical History: History reviewed. No pertinent past medical history.  Assessment: 8 YOM presenting with COVID pna, d-dimer >20, with acute bilateral DVTs found.  Not on anticoagulation PTA.  Pharmacy consulted to dose lovenox. D-dimer trend down to 11. CBC wnl, SCr stable 0.96. Weight fluctuating with diuresis. Currently on 90mg  Q24H. No bleeding noted.   Goal of Therapy:  Anti-Xa level 0.6-1 units/ml 4hrs after LMWH dose given Monitor platelets by anticoagulation protocol: Yes   Plan:  Contine Lovenox 1mg /kg (90mg ) SQ every 12 hours Monitor renal function, CBC, s/s bleeding, and weight fluctuations F/u ability to transition to PO anticoagulation as able   , PharmD, BCPS Please check AMION for all [facility] contact numbers Clinical Pharmacist 08/19/2020 9:58 AM

## 2020-08-19 NOTE — Progress Notes (Signed)
Patient transferred to 2W31 with all patient belongings.  Including Cell phone and charger, upper and lower dentures and various items.

## 2020-08-20 LAB — GLUCOSE, CAPILLARY
Glucose-Capillary: 100 mg/dL — ABNORMAL HIGH (ref 70–99)
Glucose-Capillary: 109 mg/dL — ABNORMAL HIGH (ref 70–99)
Glucose-Capillary: 120 mg/dL — ABNORMAL HIGH (ref 70–99)
Glucose-Capillary: 125 mg/dL — ABNORMAL HIGH (ref 70–99)
Glucose-Capillary: 135 mg/dL — ABNORMAL HIGH (ref 70–99)
Glucose-Capillary: 137 mg/dL — ABNORMAL HIGH (ref 70–99)
Glucose-Capillary: 138 mg/dL — ABNORMAL HIGH (ref 70–99)

## 2020-08-20 LAB — BASIC METABOLIC PANEL
Anion gap: 10 (ref 5–15)
BUN: 22 mg/dL — ABNORMAL HIGH (ref 6–20)
CO2: 27 mmol/L (ref 22–32)
Calcium: 8.5 mg/dL — ABNORMAL LOW (ref 8.9–10.3)
Chloride: 98 mmol/L (ref 98–111)
Creatinine, Ser: 1.24 mg/dL (ref 0.61–1.24)
GFR calc Af Amer: >60 mL/min (ref >60)
GFR calc non Af Amer: >60 mL/min (ref >60)
Glucose, Bld: 113 mg/dL — ABNORMAL HIGH (ref 70–99)
Potassium: 4.3 mmol/L (ref 3.5–5.1)
Sodium: 135 mmol/L (ref 135–145)

## 2020-08-20 LAB — CBC
HCT: 42.5 % (ref 39.0–52.0)
Hemoglobin: 14 g/dL (ref 13.0–17.0)
MCH: 30.8 pg (ref 26.0–34.0)
MCHC: 32.9 g/dL (ref 30.0–36.0)
MCV: 93.6 fL (ref 80.0–100.0)
Platelets: 313 10*3/uL (ref 150–400)
RBC: 4.54 MIL/uL (ref 4.22–5.81)
RDW: 14 % (ref 11.5–15.5)
WBC: 16.9 10*3/uL — ABNORMAL HIGH (ref 4.0–10.5)
nRBC: 0 % (ref 0.0–0.2)

## 2020-08-20 LAB — PROTIME-INR
INR: 1.2 (ref 0.8–1.2)
Prothrombin Time: 14.8 seconds (ref 11.4–15.2)

## 2020-08-20 LAB — MAGNESIUM: Magnesium: 2.3 mg/dL (ref 1.7–2.4)

## 2020-08-20 MED ORDER — MORPHINE SULFATE (PF) 4 MG/ML IV SOLN
4.0000 mg | Freq: Once | INTRAVENOUS | Status: AC
  Administered 2020-08-20: 4 mg via INTRAVENOUS
  Filled 2020-08-20: qty 1

## 2020-08-20 MED ORDER — MORPHINE SULFATE (PF) 2 MG/ML IV SOLN
2.0000 mg | Freq: Once | INTRAVENOUS | Status: AC
  Administered 2020-08-20: 2 mg via INTRAVENOUS
  Filled 2020-08-20: qty 1

## 2020-08-20 MED ORDER — HEPARIN (PORCINE) 25000 UT/250ML-% IV SOLN
1450.0000 [IU]/h | INTRAVENOUS | Status: DC
  Administered 2020-08-20: 1450 [IU]/h via INTRAVENOUS
  Filled 2020-08-20: qty 250

## 2020-08-20 NOTE — Progress Notes (Signed)
PROGRESS NOTE    Henry May  BTY:[id number] DOB: 1966/02/14 DOA: 08/06/2020 PCP: Patient, No Pcp Per   Brief Narrative:  54 year old without any known past medical history initially diagnosed with COVID-19 on 8/16 (confirmed outpatient test) became progressively short of breath and hypoxia therefore came to the hospital.  Briefly admitted to hospitalist service then transferred to the ICU requiring BiPAP.  Ultrasound lower extremity showed bilateral DVT, echocardiogram showed EF of 77-41%, grade 1 diastolic dysfunction with severe LV hypokinesia.  Completed course of remdesivir.  Still on baricitinib.  Received a course of antibiotics as well.   Assessment & Plan:   Principal Problem:   Acute respiratory failure due to COVID-19 [facility]) Active Problems:   ARF (acute renal failure) (HCC)   Elevated LFTs   Acute respiratory disease due to COVID-19 virus   Pneumonia due to COVID-19 virus   Acute hypoxemic respiratory failure due to COVID-19 [facility])   Leg DVT (deep venous thromboembolism), acute, bilateral (HCC)   Chest pain   Acute hypoxic respiratory failure secondary to COVID-19 pneumonia -First day confirmed + 07/31/2020. Should be able to discontinue isolation tomorrow. -Patient is still hypoxic. Still component for both Infiltrative disease -Completed course of remdesivir -Currently on baricitinib 4 mg daily -Solu-Medrol tapered off- last dose 9/1 -Combivent 3 times daily, incentive spirometer, flutter valve  Congestive heart failure with reduced ejection fraction, 40% with wall motion abnormality -Intermittently can get diuretics as necessary..   Acute bilateral lower extremity DVT -Currently on Lovenox dosed by pharmacy  Sinus tachycardia -Suspect secondary to underlying ongoing pulmonary infiltrative process and high suspicion for pulmonary embolism.  Already on treatment for both As mentioned above.  Sepsis secondary to superimposed multifocal bacterial pneumonia -This  is resolved.  Completed 7-day course of antibiotics-cefepime; completed 5/29  QT prolongation This is resolved on repeat EKG  Essential hypertension -Norvasc 5 mg daily  PT/OT is recommending CIR.  DVT prophylaxis: Treatment dose of Lovenox Code Status: Full code Family Communication: Met with wife and daughter at the nurses station on two W.  Status is: Inpatient  Remains inpatient appropriate because:Inpatient level of care appropriate due to severity of illness   Dispo: The patient is from: Home              Anticipated d/c is to: Home              Anticipated d/c date is: > 3 days              Patient currently is not medically stable to d/c.  Patient is still severely hypoxic requiring inpatient management.  Unsafe for discharge at this time.   Body mass index is 26.79 kg/m.       Subjective: Daughter and mother outside nurses station reasonably upset that they were informed by nursing staff that they could visit the patient today. I met with him at the nurses station explaining them that he does not come out of isolation until tomorrow therefore earliest visit would be either later on tomorrow or Tuesday. They are very understanding. All the questions have been answered.  Patient is sitting up in the chair, currently on heated high flow.  In terms of his respiratory symptoms he states he still has exertional dyspnea.  No better or worse from yesterday.  He is excited to come out of isolation so he can meet his family.  Review of Systems Otherwise negative except as per HPI, including: General: Denies fever, chills, night sweats or unintended weight  loss. Resp: Denies hemoptysis Cardiac: Denies chest pain, palpitations, orthopnea, paroxysmal nocturnal dyspnea. GI: Denies abdominal pain, nausea, vomiting, diarrhea or constipation GU: Denies dysuria, frequency, hesitancy or incontinence MS: Denies muscle aches, joint pain or swelling Neuro: Denies headache, neurologic  deficits (focal weakness, numbness, tingling), abnormal gait Psych: Denies anxiety, depression, SI/HI/AVH Skin: Denies new rashes or lesions ID: Denies sick contacts, exotic exposures, travel  Examination:  Constitutional: Sitting up in the chair, currently on nonrebreather.  Some dried blood around his mouth and nostrils Respiratory: Bilateral diffuse rhonchi Cardiovascular: Normal sinus rhythm, no rubs Abdomen: Nontender nondistended good bowel sounds Musculoskeletal: No edema noted Skin: No rashes seen Neurologic: CN 2-12 grossly intact.  And nonfocal Psychiatric: Normal judgment and insight. Alert and oriented x 3. Normal mood.   Objective: Vitals:   08/20/20 0110 08/20/20 0403 08/20/20 0405 08/20/20 0725  BP:  118/79  116/80  Pulse: (!) 106 (!) 102  94  Resp: (!) 36 (!) 31  (!) 34  Temp:  99.3 F (37.4 C)    TempSrc:  Axillary    SpO2: 91% 93%  92%  Weight:   89.6 kg   Height:        Intake/Output Summary (Last 24 hours) at 08/20/2020 0835 Last data filed at 08/20/2020 0007 Gross per 24 hour  Intake --  Output 300 ml  Net -300 ml   Filed Weights   08/17/20 0500 08/19/20 0616 08/20/20 0405  Weight: 90.2 kg 88.6 kg 89.6 kg     Data Reviewed:   CBC: Recent Labs  Lab 08/16/20 0312 08/17/20 0234 08/18/20 0229 08/19/20 0134 08/20/20 0500  WBC 16.1* 15.0* 16.9* 15.1* 16.9*  HGB 15.8 14.3 14.3 14.3 14.0  HCT 48.4 44.4 43.9 44.6 42.5  MCV 94.3 94.9 95.6 94.5 93.6  PLT 356 321 310 304 313   Basic Metabolic Panel: Recent Labs  Lab 08/16/20 0312 08/17/20 0234 08/18/20 0229 08/19/20 0134 08/20/20 0500  NA 134* 136 135 132* 135  K 4.4 5.3* 4.9 4.0 4.3  CL 95* 101 101 99 98  CO2 28 27 27 24 27  GLUCOSE 111* 99 88 104* 113*  BUN 30* 29* 25* 16 22*  CREATININE 1.29* 1.25* 1.09 0.96 1.24  CALCIUM 8.5* 8.4* 8.4* 8.3* 8.5*  MG  --   --   --   --  2.3   GFR: Estimated Creatinine Clearance: 73 mL/min (by C-G formula based on SCr of 1.24 mg/dL). Liver  Function Tests: No results for input(s): AST, ALT, ALKPHOS, BILITOT, PROT, ALBUMIN in the last 168 hours. No results for input(s): LIPASE, AMYLASE in the last 168 hours. No results for input(s): AMMONIA in the last 168 hours. Coagulation Profile: Recent Labs  Lab 08/16/20 0312 08/17/20 0234 08/18/20 0229 08/19/20 0134 08/20/20 0500  INR 1.2 1.2 1.1 1.2 1.2   Cardiac Enzymes: No results for input(s): CKTOTAL, CKMB, CKMBINDEX, TROPONINI in the last 168 hours. BNP (last 3 results) No results for input(s): PROBNP in the last 8760 hours. HbA1C: No results for input(s): HGBA1C in the last 72 hours. CBG: Recent Labs  Lab 08/19/20 1709 08/19/20 1943 08/20/20 0026 08/20/20 0358 08/20/20 0723  GLUCAP 96 118* 120* 109* 138*   Lipid Profile: No results for input(s): CHOL, HDL, LDLCALC, TRIG, CHOLHDL, LDLDIRECT in the last 72 hours. Thyroid Function Tests: No results for input(s): TSH, T4TOTAL, FREET4, T3FREE, THYROIDAB in the last 72 hours. Anemia Panel: No results for input(s): VITAMINB12, FOLATE, FERRITIN, TIBC, IRON, RETICCTPCT in the last 72 hours.   Sepsis Labs: No results for input(s): PROCALCITON, LATICACIDVEN in the last 168 hours.  No results found for this or any previous visit (from the past 240 hour(s)).       Radiology Studies: DG CHEST PORT 1 VIEW  Result Date: 08/18/2020 CLINICAL DATA:  Respiratory failure. EXAM: PORTABLE CHEST 1 VIEW COMPARISON:  August 15, 2020. FINDINGS: Stable cardiomegaly. No pneumothorax or pleural effusion is noted. Stable bilateral patchy airspace opacities are noted consistent with multifocal pneumonia. Bony thorax is unremarkable. IMPRESSION: Stable bilateral multifocal pneumonia. Electronically Signed   By: James  Green Jr M.D.   On: 08/18/2020 09:18        Scheduled Meds: . amLODipine  5 mg Oral Daily  . baricitinib  4 mg Oral Daily  . Chlorhexidine Gluconate Cloth  6 each Topical Daily  . enoxaparin (LOVENOX) injection  90 mg  Subcutaneous Q12H  . feeding supplement (ENSURE ENLIVE)  237 mL Oral TID BM  . fluticasone  2 spray Each Nare Daily  . insulin aspart  0-15 Units Subcutaneous TID WC  . insulin aspart  0-5 Units Subcutaneous QHS  . Ipratropium-Albuterol  1 puff Inhalation TID  . multivitamin with minerals  1 tablet Oral Daily  . pantoprazole  40 mg Oral Daily  . polyethylene glycol  17 g Oral Daily   Continuous Infusions: . sodium chloride Stopped (08/12/20 2259)     LOS: 14 days   Time spent= 35 mins    Ankit Chirag Amin, MD [facility]  If 7PM-7AM, please contact night-coverage  08/20/2020, 8:35 AM   

## 2020-08-20 NOTE — Progress Notes (Signed)
ANTICOAGULATION CONSULT NOTE  Pharmacy Consult for lovenox>> heparin  Indication: DVT  No Known Allergies  Patient Measurements: Height: 6' (182.9 cm) Weight: 89.6 kg (197 lb 8.5 oz) IBW/kg (Calculated) : 77.6  Vital Signs: Temp: 98.8 F (37.1 C) (09/05 1913) Temp Source: Oral (09/05 1913) BP: 108/80 (09/05 1913) Pulse Rate: 108 (09/05 2040)  Labs: Recent Labs    08/18/20 0229 08/18/20 0229 08/19/20 0134 08/20/20 0500  HGB 14.3   < > 14.3 14.0  HCT 43.9  --  44.6 42.5  PLT 310  --  304 313  LABPROT 14.2  --  14.4 14.8  INR 1.1  --  1.2 1.2  CREATININE 1.09  --  0.96 1.24   < > = values in this interval not displayed.    Estimated Creatinine Clearance: 73 mL/min (by C-G formula based on SCr of 1.24 mg/dL).   Medical History: History reviewed. No pertinent past medical history.  Assessment: 72 YOM presenting with COVID pna, d-dimer >20, with acute bilateral DVTs found. Not on anticoagulation PTA.  Pharmacy consulted to transition from lovenox to heparin  -Hg= 14, plt= 313 -CrCl ~ 70 -last lovenox dose was ~ 9:30am     Goal of Therapy:  Anti-Xa level 0.6-1 units/ml 4hrs after LMWH dose given Monitor platelets by anticoagulation protocol: Yes   Plan:  -start heparin at 1450 units/hr -Heparin level in 6 hours and daily wth CBC daily  Harland German, PharmD Clinical Pharmacist **Pharmacist phone directory can now be found on [email or web address] ([email or web address]).  Listed under [facility].

## 2020-08-21 DIAGNOSIS — M79605 Pain in left leg: Secondary | ICD-10-CM

## 2020-08-21 DIAGNOSIS — U071 COVID-19: Secondary | ICD-10-CM

## 2020-08-21 LAB — CBC
HCT: 36.9 % — ABNORMAL LOW (ref 39.0–52.0)
Hemoglobin: 12.3 g/dL — ABNORMAL LOW (ref 13.0–17.0)
MCH: 31.3 pg (ref 26.0–34.0)
MCHC: 33.3 g/dL (ref 30.0–36.0)
MCV: 93.9 fL (ref 80.0–100.0)
Platelets: 321 10*3/uL (ref 150–400)
RBC: 3.93 MIL/uL — ABNORMAL LOW (ref 4.22–5.81)
RDW: 13.8 % (ref 11.5–15.5)
WBC: 18.6 10*3/uL — ABNORMAL HIGH (ref 4.0–10.5)
nRBC: 0 % (ref 0.0–0.2)

## 2020-08-21 LAB — GLUCOSE, CAPILLARY
Glucose-Capillary: 153 mg/dL — ABNORMAL HIGH (ref 70–99)
Glucose-Capillary: 144 mg/dL — ABNORMAL HIGH (ref 70–99)
Glucose-Capillary: 197 mg/dL — ABNORMAL HIGH (ref 70–99)
Glucose-Capillary: 98 mg/dL (ref 70–99)
Glucose-Capillary: 145 mg/dL — ABNORMAL HIGH (ref 70–99)

## 2020-08-21 LAB — BLOOD GAS, ARTERIAL
Acid-Base Excess: 2.9 mmol/L — ABNORMAL HIGH (ref 0.0–2.0)
Bicarbonate: 26.1 mmol/L (ref 20.0–28.0)
Drawn by: 34762
FIO2: 100
O2 Saturation: 89 %
Patient temperature: 36.7
pCO2 arterial: 33.6 mmHg (ref 32.0–48.0)
pH, Arterial: 7.5 — ABNORMAL HIGH (ref 7.350–7.450)
pO2, Arterial: 53.3 mmHg — ABNORMAL LOW (ref 83.0–108.0)

## 2020-08-21 LAB — HEPARIN LEVEL (UNFRACTIONATED)
Heparin Unfractionated: 1.18 IU/mL — ABNORMAL HIGH (ref 0.30–0.70)
Heparin Unfractionated: 1.06 IU/mL — ABNORMAL HIGH (ref 0.30–0.70)
Heparin Unfractionated: 0.79 IU/mL — ABNORMAL HIGH (ref 0.30–0.70)

## 2020-08-21 LAB — BASIC METABOLIC PANEL
Anion gap: 11 (ref 5–15)
BUN: 27 mg/dL — ABNORMAL HIGH (ref 6–20)
CO2: 24 mmol/L (ref 22–32)
Calcium: 8.5 mg/dL — ABNORMAL LOW (ref 8.9–10.3)
Chloride: 99 mmol/L (ref 98–111)
Creatinine, Ser: 1.3 mg/dL — ABNORMAL HIGH (ref 0.61–1.24)
GFR calc Af Amer: >60 mL/min (ref >60)
GFR calc non Af Amer: >60 mL/min (ref >60)
Glucose, Bld: 133 mg/dL — ABNORMAL HIGH (ref 70–99)
Potassium: 4.9 mmol/L (ref 3.5–5.1)
Sodium: 134 mmol/L — ABNORMAL LOW (ref 135–145)

## 2020-08-21 LAB — PROTIME-INR
INR: 1.3 — ABNORMAL HIGH (ref 0.8–1.2)
Prothrombin Time: 15.3 seconds — ABNORMAL HIGH (ref 11.4–15.2)

## 2020-08-21 LAB — MAGNESIUM: Magnesium: 2.4 mg/dL (ref 1.7–2.4)

## 2020-08-21 LAB — PROCALCITONIN: Procalcitonin: 0.21 ng/mL

## 2020-08-21 MED ORDER — ACETAMINOPHEN 325 MG PO TABS
650.0000 mg | ORAL_TABLET | Freq: Four times a day (QID) | ORAL | Status: DC | PRN
  Administered 2020-08-22 (×2): 650 mg via ORAL
  Filled 2020-08-21 (×2): qty 2

## 2020-08-21 MED ORDER — HEPARIN (PORCINE) 25000 UT/250ML-% IV SOLN
900.0000 [IU]/h | INTRAVENOUS | Status: DC
  Administered 2020-08-21: 1000 [IU]/h via INTRAVENOUS
  Filled 2020-08-21: qty 250

## 2020-08-21 MED ORDER — HEPARIN (PORCINE) 25000 UT/250ML-% IV SOLN
1200.0000 [IU]/h | INTRAVENOUS | Status: DC
  Administered 2020-08-21: 1200 [IU]/h via INTRAVENOUS

## 2020-08-21 MED ORDER — OXYCODONE HCL 5 MG PO TABS
5.0000 mg | ORAL_TABLET | ORAL | Status: DC | PRN
  Administered 2020-08-21 – 2020-08-22 (×4): 5 mg via ORAL
  Filled 2020-08-21 (×4): qty 1

## 2020-08-21 NOTE — Progress Notes (Signed)
PROGRESS NOTE    Henry May  ZOX:[id number] DOB: [birth year] DOA: 08/06/2020 PCP: Patient, No Pcp Per   Brief Narrative:  54 year old without any known past medical history initially diagnosed with COVID-19 on 8/16 (confirmed outpatient test) became progressively short of breath and hypoxia therefore came to the hospital.  Briefly admitted to hospitalist service then transferred to the ICU requiring BiPAP.  Ultrasound lower extremity showed bilateral DVT, echocardiogram showed EF of 40-45%, grade 1 diastolic dysfunction with severe LV hypokinesia.  Completed course of remdesivir.  Still on baricitinib.  Received a course of antibiotics as well.   Assessment & Plan:   Principal Problem:   Acute respiratory failure due to COVID-19 [facility]) Active Problems:   ARF (acute renal failure) (HCC)   Elevated LFTs   Acute respiratory disease due to COVID-19 virus   Pneumonia due to COVID-19 virus   Acute hypoxemic respiratory failure due to COVID-19 [facility])   Leg DVT (deep venous thromboembolism), acute, bilateral (HCC)   Chest pain  Acute hypoxic respiratory failure secondary to COVID-19 pneumonia -First day confirmed + 07/31/2020. Should be able to discontinue isolation tomorrow. -Patient is still hypoxic. Still component for both Infiltrative disease -Completed course of remdesivir -Currently on baricitinib 4 mg daily -Repeat procalcitonin 0.2 -Solu-Medrol tapered off-last day 9/1. -Combivent 3 times daily, incentive spirometer, flutter valve  Congestive heart failure with reduced ejection fraction, 40% with wall motion abnormality -Intermittently can get diuretics as necessary..   Acute bilateral lower extremity DVT with leg pain -Now on heparin drip -Seen by vascular to concerns of lack of pulse overnight but did not seem to think so.  Sinus tachycardia -Suspect secondary to underlying ongoing pulmonary infiltrative process and high suspicion for pulmonary embolism.  Already on  treatment for both As mentioned above.  Sepsis secondary to superimposed multifocal bacterial pneumonia -This is resolved.  Completed 7-day course of antibiotics-cefepime; completed 5/29  QT prolongation This is resolved on repeat EKG  Essential hypertension -Norvasc 5 mg daily  Spoke with Dr Luciana Axe, patient will be done with his 21 day isolation today and should be able to come out of isolation tonight. His breathing symptoms are just secondary to post inflammatory changes  PT/OT is recommending CIR.  DVT prophylaxis: Treatment dose of Lovenox Code Status: Full code Family Communication: Wife updated.  Status is: Inpatient  Remains inpatient appropriate because:Inpatient level of care appropriate due to severity of illness   Dispo: The patient is from: Home              Anticipated d/c is to: Home              Anticipated d/c date is: > 3 days              Patient currently is not medically stable to d/c.  Patient is still severely hypoxic requiring inpatient management.  Unsafe for discharge at this time.   Body mass index is 26.79 kg/m.    Subjective: Patient was having a lot of leg pain overnight and there was question of possible lack of pulse therefore vascular surgery was consulted who recommended transitioning from Lovenox to heparin drip. Seen at bedside, feels ok.   Review of Systems Otherwise negative except as per HPI, including: General = no fevers, chills, dizziness,  fatigue HEENT/EYES = negative for loss of vision, double vision, blurred vision,  sore throa Cardiovascular= negative for chest pain, palpitation Respiratory/lungs= negative for  hemoptysis,  Gastrointestinal= negative for nausea, vomiting, abdominal pain Genitourinary= negative  for Dysuria MSK = Negative for arthralgia, myalgias Neurology= Negative for headache, numbness, tingling  Psychiatry= Negative for suicidal and homocidal ideation Skin= Negative for  Rash   Examination:  Constitutional: On heated high flow along with nonrebreather Respiratory: Bilateral diffuse rhonchi Cardiovascular: Normal sinus rhythm, no rubs Abdomen: Nontender nondistended good bowel sounds Musculoskeletal: No edema noted Skin: No rashes seen Neurologic: CN 2-12 grossly intact.  And nonfocal Psychiatric: Normal judgment and insight. Alert and oriented x 3. Normal mood.     Objective: Vitals:   08/21/20 0743 08/21/20 0820 08/21/20 0901 08/21/20 1200  BP: (!) 119/94   123/87  Pulse: (!) 107   (!) 110  Resp: 19  20 19   Temp: 98.6 F (37 C)   98.6 F (37 C)  TempSrc: Oral   Oral  SpO2: 93% 94%  95%  Weight:      Height:        Intake/Output Summary (Last 24 hours) at 08/21/2020 1317 Last data filed at 08/21/2020 0849 Gross per 24 hour  Intake --  Output 800 ml  Net -800 ml   Filed Weights   08/17/20 0500 08/19/20 0616 08/20/20 0405  Weight: 90.2 kg 88.6 kg 89.6 kg     Data Reviewed:   CBC: Recent Labs  Lab 08/17/20 0234 08/18/20 0229 08/19/20 0134 08/20/20 0500 08/21/20 0116  WBC 15.0* 16.9* 15.1* 16.9* 18.6*  HGB 14.3 14.3 14.3 14.0 12.3*  HCT 44.4 43.9 44.6 42.5 36.9*  MCV 94.9 95.6 94.5 93.6 93.9  PLT 321 310 304 313 321   Basic Metabolic Panel: Recent Labs  Lab 08/17/20 0234 08/18/20 0229 08/19/20 0134 08/20/20 0500 08/21/20 0116  NA 136 135 132* 135 134*  K 5.3* 4.9 4.0 4.3 4.9  CL 101 101 99 98 99  CO2 27 27 24 27 24   GLUCOSE 99 88 104* 113* 133*  BUN 29* 25* 16 22* 27*  CREATININE 1.25* 1.09 0.96 1.24 1.30*  CALCIUM 8.4* 8.4* 8.3* 8.5* 8.5*  MG  --   --   --  2.3 2.4   GFR: Estimated Creatinine Clearance: 69.6 mL/min (A) (by C-G formula based on SCr of 1.3 mg/dL (H)). Liver Function Tests: No results for input(s): AST, ALT, ALKPHOS, BILITOT, PROT, ALBUMIN in the last 168 hours. No results for input(s): LIPASE, AMYLASE in the last 168 hours. No results for input(s): AMMONIA in the last 168 hours. Coagulation  Profile: Recent Labs  Lab 08/17/20 0234 08/18/20 0229 08/19/20 0134 08/20/20 0500 08/21/20 0116  INR 1.2 1.1 1.2 1.2 1.3*   Cardiac Enzymes: No results for input(s): CKTOTAL, CKMB, CKMBINDEX, TROPONINI in the last 168 hours. BNP (last 3 results) No results for input(s): PROBNP in the last 8760 hours. HbA1C: No results for input(s): HGBA1C in the last 72 hours. CBG: Recent Labs  Lab 08/20/20 2034 08/20/20 2251 08/21/20 0330 08/21/20 0742 08/21/20 1157  GLUCAP 135* 125* 153* 144* 197*   Lipid Profile: No results for input(s): CHOL, HDL, LDLCALC, TRIG, CHOLHDL, LDLDIRECT in the last 72 hours. Thyroid Function Tests: No results for input(s): TSH, T4TOTAL, FREET4, T3FREE, THYROIDAB in the last 72 hours. Anemia Panel: No results for input(s): VITAMINB12, FOLATE, FERRITIN, TIBC, IRON, RETICCTPCT in the last 72 hours. Sepsis Labs: Recent Labs  Lab 08/21/20 0755  PROCALCITON 0.21    No results found for this or any previous visit (from the past 240 hour(s)).       Radiology Studies: No results found.      Scheduled Meds: .  amLODipine  5 mg Oral Daily  . baricitinib  4 mg Oral Daily  . Chlorhexidine Gluconate Cloth  6 each Topical Daily  . feeding supplement (ENSURE ENLIVE)  237 mL Oral TID BM  . fluticasone  2 spray Each Nare Daily  . insulin aspart  0-15 Units Subcutaneous TID WC  . insulin aspart  0-5 Units Subcutaneous QHS  . Ipratropium-Albuterol  1 puff Inhalation TID  . multivitamin with minerals  1 tablet Oral Daily  . pantoprazole  40 mg Oral Daily  . polyethylene glycol  17 g Oral Daily   Continuous Infusions: . sodium chloride Stopped (08/12/20 2259)  . heparin Stopped (08/21/20 1315)     LOS: 15 days   Time spent= 35 mins    Kyria Bumgardner Joline Maxcy, MD [facility]  If 7PM-7AM, please contact night-coverage  08/21/2020, 1:17 PM

## 2020-08-21 NOTE — Progress Notes (Signed)
ANTICOAGULATION CONSULT NOTE  Pharmacy Consult for heparin  Indication: DVT  No Known Allergies  Patient Measurements: Height: 6' (182.9 cm) Weight: 89.6 kg (197 lb 8.5 oz) IBW/kg (Calculated) : 77.6  Vital Signs: Temp: 98.6 F (37 C) (09/06 1200) Temp Source: Oral (09/06 1200) BP: 123/87 (09/06 1200) Pulse Rate: 110 (09/06 1200)  Labs: Recent Labs    08/19/20 0134 08/19/20 0134 08/20/20 0500 08/21/20 0116 08/21/20 0117 08/21/20 1055  HGB 14.3   < > 14.0 12.3*  --   --   HCT 44.6  --  42.5 36.9*  --   --   PLT 304  --  313 321  --   --   LABPROT 14.4  --  14.8 15.3*  --   --   INR 1.2  --  1.2 1.3*  --   --   HEPARINUNFRC  --   --   --   --  1.18* 1.06*  CREATININE 0.96  --  1.24 1.30*  --   --    < > = values in this interval not displayed.    Estimated Creatinine Clearance: 69.6 mL/min (A) (by C-G formula based on SCr of 1.3 mg/dL (H)).   Medical History: History reviewed. No pertinent past medical history.  Assessment: 74 YOM presenting with COVID pna, d-dimer >20, with acute bilateral DVTs found. Not on anticoagulation PTA. Pt transitioned from Lovenox to Heparin 9/5.  Heparin level remains elevated (1.06) after holding ~1 hour and reducing rate. Hgb 12.3, Plt 321. No bleeding observed, heparin running as expected per RN. RN states that heparin level appears to have been drawn correctly.   Goal of Therapy:  Heparin level 0.3-0.7 units/ml Monitor platelets by anticoagulation protocol: Yes   Plan:  Hold heparin x 1 hour then restart at 1000 units/hr F/u 6 heparin level after restart  Trixie Rude, PharmD PGY1 Acute Care Pharmacy Resident 08/21/2020 12:45 PM  Please check [email or web address] for unit-specific pharmacy phone numbers.

## 2020-08-21 NOTE — Progress Notes (Signed)
ANTICOAGULATION CONSULT NOTE  Pharmacy Consult for Heparin  Indication: DVT  No Known Allergies  Patient Measurements: Height: 6' (182.9 cm) Weight: 89.6 kg (197 lb 8.5 oz) IBW/kg (Calculated) : 77.6  Heparin Dosing Weight: 89.6 kg  Vital Signs: Temp: 100.3 F (37.9 C) (09/06 1944) Temp Source: Axillary (09/06 1944) BP: 110/85 (09/06 1944) Pulse Rate: 117 (09/06 1944)  Labs: Recent Labs    08/19/20 0134 08/19/20 0134 08/20/20 0500 08/21/20 0116 08/21/20 0117 08/21/20 1055 08/21/20 2011  HGB 14.3   < > 14.0 12.3*  --   --   --   HCT 44.6  --  42.5 36.9*  --   --   --   PLT 304  --  313 321  --   --   --   LABPROT 14.4  --  14.8 15.3*  --   --   --   INR 1.2  --  1.2 1.3*  --   --   --   HEPARINUNFRC  --   --   --   --  1.18* 1.06* 0.79*  CREATININE 0.96  --  1.24 1.30*  --   --   --    < > = values in this interval not displayed.    Estimated Creatinine Clearance: 69.6 mL/min (A) (by C-G formula based on SCr of 1.3 mg/dL (H)).   Medical History: History reviewed. No pertinent past medical history.  Assessment: 54 yr old male presented with COVID pneumonia, d-dimer >20, with acute bilateral DVTs. Pt was not on anticoagulation PTA. Pt was transitioned from Lovenox to IV heparin on 08/20/20.  Heparin level ~5.5 hrs after infusion was held X 1 hr, then restarted at 1000 units/hr, was 0.79 units/ml, which is above the goal range for this pt. H/H 12.3/36.9, platelets 321. Per RN, no issues with IV; pt did have small amount of bleeding under dentures when RN removed dentures (not a new finding, has not worsened).  Goal of Therapy:  Heparin level 0.3-0.7 units/ml Monitor platelets by anticoagulation protocol: Yes   Plan:  Decrease heparin infusion to 900 units/hr Check 6-hr heparin level Monitor daily heparin level, CBC Monitor for signs/symptoms of bleeding  Vicki Mallet, PharmD, BCPS, [facility] Clinical Pharmacist 08/21/2020 9:39 PM

## 2020-08-21 NOTE — Progress Notes (Signed)
ANTICOAGULATION CONSULT NOTE  Pharmacy Consult for heparin  Indication: DVT  No Known Allergies  Patient Measurements: Height: 6' (182.9 cm) Weight: 89.6 kg (197 lb 8.5 oz) IBW/kg (Calculated) : 77.6  Vital Signs: Temp: 98.8 F (37.1 C) (09/05 2255) Temp Source: Oral (09/05 2255) BP: 108/80 (09/05 1913) Pulse Rate: 108 (09/05 2040)  Labs: Recent Labs    08/19/20 0134 08/19/20 0134 08/20/20 0500 08/21/20 0116 08/21/20 0117  HGB 14.3   < > 14.0 12.3*  --   HCT 44.6  --  42.5 36.9*  --   PLT 304  --  313 321  --   LABPROT 14.4  --  14.8 15.3*  --   INR 1.2  --  1.2 1.3*  --   HEPARINUNFRC  --   --   --   --  1.18*  CREATININE 0.96  --  1.24 1.30*  --    < > = values in this interval not displayed.    Estimated Creatinine Clearance: 69.6 mL/min (A) (by C-G formula based on SCr of 1.3 mg/dL (H)).   Medical History: History reviewed. No pertinent past medical history.  Assessment: 3 YOM presenting with COVID pna, d-dimer >20, with acute bilateral DVTs found. Not on anticoagulation PTA. Pt transitioned from Lovenox to Heparin 9/5.  Heparin level elevated (1.18) - may still be some of Lovenox effects. No bleeding noted. RN states that heparin level appears to have been drawn correctly.  Goal of Therapy:  Heparin level 0.3-0.7 units/ml Monitor platelets by anticoagulation protocol: Yes   Plan:  Hold heparin x 1 hour then restart at 1200 units/hr F/u 6 heparin level after restart  Christoper Fabian, PharmD, BCPS Please see amion for complete clinical pharmacist phone list 08/21/2020 2:17 AM

## 2020-08-21 NOTE — Consult Note (Signed)
Patient name: Henry May MRN: [id number] DOB: September 17, 1966 Sex: male  REASON FOR CONSULT: Left leg pain rule out ischemia  HPI: Henry May is a 54 y.o. male, who has been complaining of left leg pain for a few days. Apparently got worse today. On initial exam by his nurse they were unable to find a dorsalis pedis Doppler signal. I was called by the hospitalist to do an evaluation of the left leg for possible acute ischemia. Patient states the left leg still hurts but not as bad as it did earlier. The pain medicine has helped some. Patient is currently being treated for COVID-19. He had been on therapeutic Lovenox for a DVT prior to today. He was transitioned over to IV heparin at my request.  History reviewed. No pertinent past medical history. History reviewed. No pertinent surgical history.  Family History  Problem Relation Age of Onset  . Diabetes Mellitus II Neg Hx     SOCIAL HISTORY: Social History   Socioeconomic History  . Marital status: Married    Spouse name: Not on file  . Number of children: Not on file  . Years of education: Not on file  . Highest education level: Not on file  Occupational History  . Not on file  Tobacco Use  . Smoking status: Never Smoker  . Smokeless tobacco: Never Used  Substance and Sexual Activity  . Alcohol use: Not on file  . Drug use: Not on file  . Sexual activity: Not on file  Other Topics Concern  . Not on file  Social History Narrative  . Not on file   Social Determinants of Health   Financial Resource Strain:   . Difficulty of Paying Living Expenses: Not on file  Food Insecurity:   . Worried About Programme researcher, broadcasting/film/video in the Last Year: Not on file  . Ran Out of Food in the Last Year: Not on file  Transportation Needs:   . Lack of Transportation (Medical): Not on file  . Lack of Transportation (Non-Medical): Not on file  Physical Activity:   . Days of Exercise per Week: Not on file  . Minutes of Exercise per Session:  Not on file  Stress:   . Feeling of Stress : Not on file  Social Connections:   . Frequency of Communication with Friends and Family: Not on file  . Frequency of Social Gatherings with Friends and Family: Not on file  . Attends Religious Services: Not on file  . Active Member of Clubs or Organizations: Not on file  . Attends Banker Meetings: Not on file  . Marital Status: Not on file  Intimate Partner Violence:   . Fear of Current or Ex-Partner: Not on file  . Emotionally Abused: Not on file  . Physically Abused: Not on file  . Sexually Abused: Not on file    No Known Allergies  Current Facility-Administered Medications  Medication Dose Route Frequency Provider Last Rate Last Admin  . 0.9 %  sodium chloride infusion   Intravenous PRN Luciano Cutter, MD   Paused at 08/12/20 2259  . amLODipine (NORVASC) tablet 5 mg  5 mg Oral Daily Icard, Bradley L, DO   5 mg at 08/20/20 0925  . baricitinib (OLUMIANT) tablet 4 mg  4 mg Oral Daily Icard, Bradley L, DO   4 mg at 08/20/20 0926  . Chlorhexidine Gluconate Cloth 2 % PADS 6 each  6 each Topical Daily Luciano Cutter, MD  6 each at 08/20/20 0926  . chlorpheniramine-HYDROcodone (TUSSIONEX) 10-8 MG/5ML suspension 5 mL  5 mL Oral Q12H PRN Eduard Clos, MD      . docusate sodium (COLACE) capsule 100 mg  100 mg Oral BID PRN Gleason, Darcella Gasman, PA-C      . feeding supplement (ENSURE ENLIVE) (ENSURE ENLIVE) liquid 237 mL  237 mL Oral TID BM Martina Sinner, MD   237 mL at 08/20/20 2031  . fluticasone (FLONASE) 50 MCG/ACT nasal spray 2 spray  2 spray Each Nare Daily Selmer Dominion B, NP   2 spray at 08/20/20 0926  . guaiFENesin-dextromethorphan (ROBITUSSIN DM) 100-10 MG/5ML syrup 10 mL  10 mL Oral Q4H PRN Eduard Clos, MD   10 mL at 08/15/20 2059  . heparin ADULT infusion 100 units/mL (25000 units/230mL sodium chloride 0.45%)  1,450 Units/hr Intravenous Continuous Silvana Newness, RPH 14.5 mL/hr at 08/20/20 2210 1,450  Units/hr at 08/20/20 2210  . insulin aspart (novoLOG) injection 0-15 Units  0-15 Units Subcutaneous TID WC Briant Sites, DO   2 Units at 08/20/20 1319  . insulin aspart (novoLOG) injection 0-5 Units  0-5 Units Subcutaneous QHS Briant Sites, DO      . Ipratropium-Albuterol (COMBIVENT) respimat 1 puff  1 puff Inhalation TID Luciano Cutter, MD   1 puff at 08/20/20 2045  . labetalol (NORMODYNE) injection 5 mg  5 mg Intravenous Q2H PRN Icard, Bradley L, DO      . multivitamin with minerals tablet 1 tablet  1 tablet Oral Daily Martina Sinner, MD   1 tablet at 08/20/20 0925  . pantoprazole (PROTONIX) EC tablet 40 mg  40 mg Oral Daily Doran Stabler, DO   40 mg at 08/20/20 0925  . polyethylene glycol (MIRALAX / GLYCOLAX) packet 17 g  17 g Oral Daily Icard, Bradley L, DO   17 g at 08/20/20 0925  . sodium chloride (OCEAN) 0.65 % nasal spray 1 spray  1 spray Each Nare PRN Icard, Bradley L, DO   1 spray at 08/11/20 2117    ROS:   General:  No weight loss, Fever, chills  HEENT: No recent headaches, no nasal bleeding, no visual changes, no sore throat  Neurologic: No dizziness, blackouts, seizures. No recent symptoms of stroke or mini- stroke. No recent episodes of slurred speech, or temporary blindness.  Cardiac: No recent episodes of chest pain/pressure, + shortness of breath at rest.  + shortness of breath with exertion.  Denies history of atrial fibrillation or irregular heartbeat  Vascular: No history of rest pain in feet.  No history of claudication.  No history of non-healing ulcer, No history of DVT   Pulmonary: No home oxygen, no productive cough, no hemoptysis,  No asthma or wheezing  Musculoskeletal:  [ ]  Arthritis, [ ]  Low back pain,  [ ]  Joint pain  Hematologic:No history of hypercoagulable state.  No history of easy bleeding.  No history of anemia  Gastrointestinal: No hematochezia or melena,  No gastroesophageal reflux, no trouble swallowing  Urinary: [ ]  chronic Kidney  disease, [ ]  on HD - [ ]  MWF or [ ]  TTHS, [ ]  Burning with urination, [ ]  Frequent urination, [ ]  Difficulty urinating;   Skin: No rashes  Psychological: No history of anxiety,  No history of depression   Physical Examination  Vitals:   08/20/20 1231 08/20/20 1913 08/20/20 2040 08/20/20 2255  BP:  108/80    Pulse:   (!) 108   Resp:  20 20 19   Temp:  98.8 F (37.1 C)  98.8 F (37.1 C)  TempSrc:  Oral  Oral  SpO2: 90%     Weight:      Height:        Body mass index is 26.79 kg/m.  General:  Alert and oriented, no acute distress HEENT: Normal Neck: No JVD Cardiac: Regular Rate and Rhythm sinus tachycardia Abdomen: Soft, non-tender, non-distended, no mas Skin: No rash Extremity Pulses:  2+ radial, brachial, femoral, popliteal 1+ dorsalis pedis, posterior tibial pulses bilaterally, bi to triphasic PT DP Doppler flow bilaterally Musculoskeletal: No deformity or edema  Neurologic: Upper and lower extremity motor 5/5 and symmetric  DATA:  CBC    Component Value Date/Time   WBC 18.6 (H) 08/21/2020 0116   RBC 3.93 (L) 08/21/2020 0116   HGB 12.3 (L) 08/21/2020 0116   HCT 36.9 (L) 08/21/2020 0116   PLT 321 08/21/2020 0116   MCV 93.9 08/21/2020 0116   MCH 31.3 08/21/2020 0116   MCHC 33.3 08/21/2020 0116   RDW 13.8 08/21/2020 0116   LYMPHSABS 1.0 08/12/2020 0650   MONOABS 0.4 08/12/2020 0650   EOSABS 0.0 08/12/2020 0650   BASOSABS 0.1 08/12/2020 0650    BMET    Component Value Date/Time   NA 134 (L) 08/21/2020 0116   K 4.9 08/21/2020 0116   CL 99 08/21/2020 0116   CO2 24 08/21/2020 0116   GLUCOSE 133 (H) 08/21/2020 0116   BUN 27 (H) 08/21/2020 0116   CREATININE 1.30 (H) 08/21/2020 0116   CALCIUM 8.5 (L) 08/21/2020 0116   GFRNONAA >60 08/21/2020 0116   GFRAA >60 08/21/2020 0116     ASSESSMENT: Left leg pain unknown etiology. Patient does have a known DVT. This is being treated currently with anticoagulation. There is minimal edema in the left leg. He has  adequate arterial flow in both lower extremities. No evidence of acute ischemia.   PLAN: Continue heparin drip. No need for vascular intervention currently. Please recall if clinical condition deteriorates or changes.   [id number], MD [facility] Office: [phone]

## 2020-08-21 NOTE — Progress Notes (Signed)
pt recieved morphine at 1814 and is still complianing of pain in his left leg. his leg is cold to touch and no pulse. A doppler was use to find pt pulse. Pt was given 4 mg of morphine at 2031 pt still complaint about pain. Provider Asia B.Zierle-Gosh MD was notified and present at bed to do further assessment. Pt was placed on heparin Gtt.

## 2020-08-22 ENCOUNTER — Inpatient Hospital Stay (HOSPITAL_COMMUNITY): Payer: BC Managed Care – PPO

## 2020-08-22 DIAGNOSIS — R0902 Hypoxemia: Secondary | ICD-10-CM

## 2020-08-22 DIAGNOSIS — I469 Cardiac arrest, cause unspecified: Secondary | ICD-10-CM

## 2020-08-22 LAB — BLOOD GAS, ARTERIAL
Acid-base deficit: 19 mmol/L — ABNORMAL HIGH (ref 0.0–2.0)
Bicarbonate: 9.8 mmol/L — ABNORMAL LOW (ref 20.0–28.0)
Drawn by: 42624
FIO2: 100
O2 Saturation: 98.2 %
Patient temperature: 36.5
pCO2 arterial: 38.7 mmHg (ref 32.0–48.0)
pH, Arterial: 7.027 — CL (ref 7.350–7.450)
pO2, Arterial: 232 mmHg — ABNORMAL HIGH (ref 83.0–108.0)

## 2020-08-22 LAB — CBC
HCT: 29.8 % — ABNORMAL LOW (ref 39.0–52.0)
Hemoglobin: 9.6 g/dL — ABNORMAL LOW (ref 13.0–17.0)
MCH: 30.4 pg (ref 26.0–34.0)
MCHC: 32.2 g/dL (ref 30.0–36.0)
MCV: 94.3 fL (ref 80.0–100.0)
Platelets: 285 10*3/uL (ref 150–400)
RBC: 3.16 MIL/uL — ABNORMAL LOW (ref 4.22–5.81)
RDW: 13.8 % (ref 11.5–15.5)
WBC: 17.7 10*3/uL — ABNORMAL HIGH (ref 4.0–10.5)
nRBC: 0 % (ref 0.0–0.2)

## 2020-08-22 LAB — BASIC METABOLIC PANEL
Anion gap: 11 (ref 5–15)
BUN: 31 mg/dL — ABNORMAL HIGH (ref 6–20)
CO2: 25 mmol/L (ref 22–32)
Calcium: 8.3 mg/dL — ABNORMAL LOW (ref 8.9–10.3)
Chloride: 94 mmol/L — ABNORMAL LOW (ref 98–111)
Creatinine, Ser: 1.16 mg/dL (ref 0.61–1.24)
GFR calc Af Amer: >60 mL/min (ref >60)
GFR calc non Af Amer: >60 mL/min (ref >60)
Glucose, Bld: 125 mg/dL — ABNORMAL HIGH (ref 70–99)
Potassium: 5 mmol/L (ref 3.5–5.1)
Sodium: 130 mmol/L — ABNORMAL LOW (ref 135–145)

## 2020-08-22 LAB — POCT I-STAT 7, (LYTES, BLD GAS, ICA,H+H)
Acid-base deficit: 19 mmol/L — ABNORMAL HIGH (ref 0.0–2.0)
Bicarbonate: 11.4 mmol/L — ABNORMAL LOW (ref 20.0–28.0)
Calcium, Ion: 1.02 mmol/L — ABNORMAL LOW (ref 1.15–1.40)
HCT: 20 % — ABNORMAL LOW (ref 39.0–52.0)
Hemoglobin: 6.8 g/dL — CL (ref 13.0–17.0)
O2 Saturation: 100 %
Patient temperature: 98.6
Potassium: 5.5 mmol/L — ABNORMAL HIGH (ref 3.5–5.1)
Sodium: 129 mmol/L — ABNORMAL LOW (ref 135–145)
TCO2: 13 mmol/L — ABNORMAL LOW (ref 22–32)
pCO2 arterial: 49.3 mmHg — ABNORMAL HIGH (ref 32.0–48.0)
pH, Arterial: 6.971 — CL (ref 7.350–7.450)
pO2, Arterial: 263 mmHg — ABNORMAL HIGH (ref 83.0–108.0)

## 2020-08-22 LAB — D-DIMER, QUANTITATIVE
D-Dimer, Quant: 2.51 ug/mL-FEU — ABNORMAL HIGH (ref 0.00–0.50)
D-Dimer, Quant: 3.78 ug/mL-FEU — ABNORMAL HIGH (ref 0.00–0.50)

## 2020-08-22 LAB — GLUCOSE, CAPILLARY
Glucose-Capillary: 119 mg/dL — ABNORMAL HIGH (ref 70–99)
Glucose-Capillary: 125 mg/dL — ABNORMAL HIGH (ref 70–99)
Glucose-Capillary: 136 mg/dL — ABNORMAL HIGH (ref 70–99)
Glucose-Capillary: 160 mg/dL — ABNORMAL HIGH (ref 70–99)
Glucose-Capillary: 191 mg/dL — ABNORMAL HIGH (ref 70–99)
Glucose-Capillary: 200 mg/dL — ABNORMAL HIGH (ref 70–99)

## 2020-08-22 LAB — PROTIME-INR
INR: 1.2 (ref 0.8–1.2)
Prothrombin Time: 14.8 seconds (ref 11.4–15.2)

## 2020-08-22 LAB — MAGNESIUM: Magnesium: 2.5 mg/dL — ABNORMAL HIGH (ref 1.7–2.4)

## 2020-08-22 LAB — HEPARIN LEVEL (UNFRACTIONATED): Heparin Unfractionated: 0.41 IU/mL (ref 0.30–0.70)

## 2020-08-22 LAB — C-REACTIVE PROTEIN: CRP: 11.1 mg/dL — ABNORMAL HIGH (ref <1.0)

## 2020-08-22 LAB — LACTIC ACID, PLASMA: Lactic Acid, Venous: >11 mmol/L (ref 0.5–1.9)

## 2020-08-22 LAB — LACTATE DEHYDROGENASE: LDH: 459 U/L — ABNORMAL HIGH (ref 98–192)

## 2020-08-22 MED ORDER — SODIUM CHLORIDE 0.9% FLUSH
10.0000 mL | Freq: Two times a day (BID) | INTRAVENOUS | Status: DC
  Administered 2020-08-23 (×2): 30 mL
  Administered 2020-08-23 – 2020-08-24 (×2): 10 mL

## 2020-08-22 MED ORDER — SODIUM BICARBONATE 8.4 % IV SOLN
INTRAVENOUS | Status: DC
  Filled 2020-08-22: qty 850

## 2020-08-22 MED ORDER — ALPRAZOLAM 0.5 MG PO TABS
0.5000 mg | ORAL_TABLET | Freq: Once | ORAL | Status: AC
  Administered 2020-08-22: 0.5 mg via ORAL
  Filled 2020-08-22: qty 1

## 2020-08-22 MED ORDER — CLONAZEPAM 1 MG PO TABS
1.0000 mg | ORAL_TABLET | Freq: Two times a day (BID) | ORAL | Status: DC
  Administered 2020-08-23 – 2020-08-24 (×4): 1 mg
  Filled 2020-08-22 (×4): qty 1

## 2020-08-22 MED ORDER — FENTANYL CITRATE (PF) 100 MCG/2ML IJ SOLN: 50.0000 ug | INTRAMUSCULAR | Status: DC | PRN

## 2020-08-22 MED ORDER — VASOPRESSIN 20 UNITS/100 ML INFUSION FOR SHOCK
0.0000 [IU]/min | INTRAVENOUS | Status: DC
  Administered 2020-08-22 – 2020-08-23 (×2): 0.03 [IU]/min via INTRAVENOUS
  Filled 2020-08-22 (×2): qty 100

## 2020-08-22 MED ORDER — DEXMEDETOMIDINE HCL IN NACL 400 MCG/100ML IV SOLN
0.0000 ug/kg/h | INTRAVENOUS | Status: DC
  Administered 2020-08-22: 0.4 ug/kg/h via INTRAVENOUS
  Administered 2020-08-23 – 2020-08-24 (×5): 1 ug/kg/h via INTRAVENOUS
  Administered 2020-08-24: 0.6 ug/kg/h via INTRAVENOUS
  Administered 2020-08-24 (×2): 0.8 ug/kg/h via INTRAVENOUS
  Filled 2020-08-22 (×3): qty 100
  Filled 2020-08-22: qty 200
  Filled 2020-08-22: qty 100
  Filled 2020-08-22: qty 200
  Filled 2020-08-22: qty 100
  Filled 2020-08-22: qty 200

## 2020-08-22 MED ORDER — SODIUM CHLORIDE 0.9 % IV BOLUS
250.0000 mL | Freq: Once | INTRAVENOUS | Status: AC | PRN
  Administered 2020-08-22: 250 mL via INTRAVENOUS

## 2020-08-22 MED ORDER — DOCUSATE SODIUM 100 MG PO CAPS
100.0000 mg | ORAL_CAPSULE | Freq: Two times a day (BID) | ORAL | Status: DC
  Administered 2020-08-22: 100 mg via ORAL
  Filled 2020-08-22: qty 1

## 2020-08-22 MED ORDER — CHLORHEXIDINE GLUCONATE 0.12% ORAL RINSE (MEDLINE KIT)
15.0000 mL | Freq: Two times a day (BID) | OROMUCOSAL | Status: DC
  Administered 2020-08-23 – 2020-08-24 (×4): 15 mL via OROMUCOSAL

## 2020-08-22 MED ORDER — DOCUSATE SODIUM 50 MG/5ML PO LIQD
100.0000 mg | Freq: Two times a day (BID) | ORAL | Status: DC
  Filled 2020-08-22: qty 10

## 2020-08-22 MED ORDER — SODIUM BICARBONATE 8.4 % IV SOLN
INTRAVENOUS | Status: AC
  Administered 2020-08-22: 50 meq
  Filled 2020-08-22: qty 50

## 2020-08-22 MED ORDER — SODIUM CHLORIDE 0.9 % IV SOLN
250.0000 mL | INTRAVENOUS | Status: DC
  Administered 2020-08-23: 250 mL via INTRAVENOUS

## 2020-08-22 MED ORDER — SODIUM CHLORIDE 0.9% FLUSH: 10.0000 mL | INTRAVENOUS | Status: DC | PRN

## 2020-08-22 MED ORDER — SODIUM BICARBONATE 8.4 % IV SOLN
INTRAVENOUS | Status: AC
  Administered 2020-08-22: 100 meq
  Filled 2020-08-22: qty 100

## 2020-08-22 MED ORDER — ORAL CARE MOUTH RINSE
15.0000 mL | OROMUCOSAL | Status: DC
  Administered 2020-08-23 – 2020-08-24 (×17): 15 mL via OROMUCOSAL

## 2020-08-22 MED ORDER — FENTANYL CITRATE (PF) 100 MCG/2ML IJ SOLN
INTRAMUSCULAR | Status: AC
  Filled 2020-08-22: qty 2

## 2020-08-22 MED ORDER — SODIUM CHLORIDE 0.9 % IV BOLUS
500.0000 mL | Freq: Once | INTRAVENOUS | Status: AC
  Administered 2020-08-22: 500 mL via INTRAVENOUS

## 2020-08-22 MED ORDER — METHYLPREDNISOLONE SODIUM SUCC 40 MG IJ SOLR
40.0000 mg | Freq: Two times a day (BID) | INTRAMUSCULAR | Status: DC
  Administered 2020-08-22 – 2020-08-23 (×2): 40 mg via INTRAVENOUS
  Filled 2020-08-22 (×3): qty 1

## 2020-08-22 MED ORDER — ALBUTEROL SULFATE HFA 108 (90 BASE) MCG/ACT IN AERS
6.0000 | INHALATION_SPRAY | RESPIRATORY_TRACT | Status: DC
  Filled 2020-08-22: qty 6.7

## 2020-08-22 MED ORDER — MIDAZOLAM HCL 2 MG/2ML IJ SOLN: 2.0000 mg | INTRAMUSCULAR | Status: DC | PRN

## 2020-08-22 MED ORDER — MIDAZOLAM HCL 2 MG/2ML IJ SOLN
2.0000 mg | INTRAMUSCULAR | Status: DC | PRN
  Filled 2020-08-22: qty 2

## 2020-08-22 MED ORDER — FENTANYL CITRATE (PF) 100 MCG/2ML IJ SOLN
50.0000 ug | INTRAMUSCULAR | Status: DC | PRN
  Administered 2020-08-22: 50 ug via INTRAVENOUS

## 2020-08-22 MED ORDER — SODIUM BICARBONATE 8.4 % IV SOLN
INTRAVENOUS | Status: DC
  Filled 2020-08-22: qty 100

## 2020-08-22 MED ORDER — NOREPINEPHRINE 4 MG/250ML-% IV SOLN
0.0000 ug/min | INTRAVENOUS | Status: DC
  Administered 2020-08-22: 20 ug/min via INTRAVENOUS
  Administered 2020-08-23: 40 ug/min via INTRAVENOUS
  Administered 2020-08-23: 30 ug/min via INTRAVENOUS
  Filled 2020-08-22 (×3): qty 250

## 2020-08-22 MED ORDER — OXYCODONE HCL 5 MG/5ML PO SOLN: 5.0000 mg | ORAL | Status: DC | PRN

## 2020-08-22 MED ORDER — MORPHINE SULFATE (PF) 2 MG/ML IV SOLN: 2.0000 mg | Freq: Once | INTRAVENOUS | Status: DC

## 2020-08-22 NOTE — Progress Notes (Signed)
Family called and updated on pt status.

## 2020-08-22 NOTE — Plan of Care (Signed)
  Problem: Education: Goal: Knowledge of General Education information will improve Description: Including pain rating scale, medication(s)/side effects and non-pharmacologic comfort measures Outcome: Progressing   Problem: Clinical Measurements: Goal: Respiratory complications will improve Outcome: Progressing  Patient verbalizes understanding that activity will affect oxygen demand. Problem: Nutrition: Goal: Adequate nutrition will be maintained Outcome: Progressing  Patient discusses plan to increase intake by eating food from  home.

## 2020-08-22 NOTE — Progress Notes (Signed)
ANTICOAGULATION CONSULT NOTE  Pharmacy Consult for Heparin  Indication: DVT  No Known Allergies  Patient Measurements: Height: 6' (182.9 cm) Weight: 89.6 kg (197 lb 8.5 oz) IBW/kg (Calculated) : 77.6  Heparin Dosing Weight: 89.6 kg  Vital Signs: Temp: 98.5 F (36.9 C) (09/07 0759) Temp Source: Axillary (09/07 0759) BP: 124/85 (09/07 0759) Pulse Rate: 98 (09/07 0759)  Labs: Recent Labs    08/20/20 0500 08/20/20 0500 08/21/20 0116 08/21/20 0117 08/21/20 1055 08/21/20 2011 08/22/20 0607  HGB 14.0   < > 12.3*  --   --   --  9.6*  HCT 42.5  --  36.9*  --   --   --  29.8*  PLT 313  --  321  --   --   --  285  LABPROT 14.8  --  15.3*  --   --   --  14.8  INR 1.2  --  1.3*  --   --   --  1.2  HEPARINUNFRC  --   --   --    < > 1.06* 0.79* 0.41  CREATININE 1.24  --  1.30*  --   --   --  1.16   < > = values in this interval not displayed.    Estimated Creatinine Clearance: 78 mL/min (by C-G formula based on SCr of 1.16 mg/dL).  Assessment: 54 yr old male presented with COVID pneumonia, d-dimer >20, with acute bilateral DVTs. Pt was not on anticoagulation PTA. Pt was transitioned from Lovenox to IV heparin on 08/20/20.  Hep lvl within goal Cbc stable  Goal of Therapy:  Heparin level 0.3-0.7 units/ml Monitor platelets by anticoagulation protocol: Yes   Plan:  Continue heparin 900 units/hr Daily hep lvl cbc  Elmer Sow, PharmD, BCPS, BCCCP Clinical Pharmacist [phone]  Please check AMION for all [facility] Pharmacy numbers  08/22/2020 9:52 AM

## 2020-08-22 NOTE — Progress Notes (Signed)
   08/22/20 1955  Assess: MEWS Score  BP (!) 66/49  ECG Heart Rate 98  Resp (!) 44  Assess: MEWS Score  MEWS Temp 0  MEWS Systolic 3  MEWS Pulse 0  MEWS RR 3  MEWS LOC 0  MEWS Score 6  MEWS Score Color Red  Assess: if the MEWS score is Yellow or Red  Were vital signs taken at a resting state? Yes  Focused Assessment Change from prior assessment (see assessment flowsheet)  Treat  Pain Scale 0-10  Pain Score 0  Interventions Reposition;Other (comment) (increased O2, patient bolused)  Patients response to intervention Unchanged  Take Vital Signs  Increase Vital Sign Frequency  Red: Q 1hr X 4 then Q 4hr X 4, if remains red, continue Q 4hrs  Escalate  MEWS: Escalate Red: discuss with charge nurse/RN and provider, consider discussing with RRT  Notify: Provider  Provider Name/Title Zielre-Gorsh  Date Provider Notified 08/22/20  Time Provider Notified 1915  Notification Type Page  Notification Reason Change in status  Response See new orders  Date of Provider Response 08/22/20  Time of Provider Response 1920  Notify: Rapid Response  Name of Rapid Response RN Notified Mindy  Date Rapid Response Notified 08/22/20  Time Rapid Response Notified 1845  Document  Patient Outcome Not stable and remains on department   RN requested Rapid Response RN to assess patient after patient appeared to have intermittently labored breathing and was still stating he couldn't breathe after interventions (positioning, medications) around 1845. Pt blood sugar was 191, BP was 63/44, bolus initiated. Patient alert and conversant but appearing lethargic which was different from previous presentation when assessed by Rapid Response. MD notified, additional orders received.

## 2020-08-22 NOTE — Code Documentation (Addendum)
  Patient Name: Henry May   MRN: [id number]   Date of Birth/ Sex: 02-12-1966 , male      Admission Date: 08/06/2020  Attending Provider: Dimple Nanas, MD  Primary Diagnosis: Acute respiratory failure due to COVID-19 [facility])   Indication: Pt was in his usual state of health until this PM, when he was noted to have labored breathing and stated "he could not breath." BP at this time was 63/44, bolus was then initiated. Patient was then found with PEA arrest 2/2 acute respiratory failure. Code blue was subsequently called. At the time of arrival on scene, ACLS protocol was underway.   Technical Description:  - CPR performance duration:  4 minutes  - Was defibrillation or cardioversion used? No   - Was external pacer placed? No  - Was patient intubated pre/post CPR? Yes   Medications Administered: Y = Yes; Blank = No Amiodarone    Atropine    Calcium    Epinephrine  Y  Lidocaine    Magnesium    Norepinephrine    Phenylephrine    Sodium bicarbonate    Vasopressin     Post CPR evaluation:  - Final Status - Was patient successfully resuscitated ? Yes - What is current rhythm? NSR - What is current hemodynamic status? Requiring pressor support  Miscellaneous Information:  - Labs sent, including: I-STAT 7  - Primary team notified?  Attempted to call Dr. Nelson Chimes, no response. Sent message via epic and will try again to call later  - Family Notified? Yes, family called by Dr. Marcos Eke  - Additional notes/ transfer status:  Transferred to ICU     Belva Agee, MD  08/22/2020, 11:34 PM

## 2020-08-22 NOTE — Progress Notes (Signed)
   08/22/20 0759  Vitals  Temp 98.5 F (36.9 C)  Temp Source Axillary  BP 124/85  MAP (mmHg) 96  Pulse Rate 98  ECG Heart Rate 98  Resp (!) 32  MEWS COLOR  MEWS Score Color Yellow  Oxygen Therapy  SpO2 93 %  Pain Assessment  Pain Scale 0-10  Pain Score 0  Complaints & Interventions  Interventions Reposition;Other (comment) (food)  Patients response to intervention Effective  MEWS Score  MEWS Temp 0  MEWS Systolic 0  MEWS Pulse 0  MEWS RR 2  MEWS LOC 0  MEWS Score 2  Patient has been a yellow mews score for at least 24 hours.  Patient alert and conversant, denies pain at this time. Asking for beverage. See assessment.

## 2020-08-22 NOTE — Progress Notes (Signed)
   08/22/20 1459 08/22/20 1500  Assess: MEWS Score  Temp (!) 97.2 F (36.2 C)  --   BP 118/80  --   Pulse Rate (!) 105 (!) 108  ECG Heart Rate (!) 107 (!) 107  Resp (!) 37 (!) 29  SpO2 100 % 100 %  O2 Device HFNC  --   Assess: MEWS Score  MEWS Temp 0 0  MEWS Systolic 0 0  MEWS Pulse 1 1  MEWS RR 3 2  MEWS LOC 0 0  MEWS Score 4 3  MEWS Score Color Red Yellow  Treat  Pain Scale 0-10  --   Pain Score 0  --   Complains of Other (Comment) (feeling hot)  --   Notify: Provider  Provider Name/Title amin  --   Date Provider Notified 08/22/20  --   Time Provider Notified 1507  --   Notification Reason Change in status  --   Document  Patient Outcome  --  Stabilized after interventions   MD notified of brief Red mews score. Patient c/o feeling hot, staff placing ice pack on neck, adjusting room temp.  Pt skin cool to touch. Vital signs as documented. Patient denies pain, alert and conversant.

## 2020-08-22 NOTE — Progress Notes (Addendum)
PROGRESS NOTE    Henry May  [id number] DOB: 05-11-1966 DOA: 08/06/2020 PCP: Patient, No Pcp Per   Brief Narrative:  54 year old without any known past medical history initially diagnosed with COVID-19 on 8/16 (confirmed outpatient test) became progressively short of breath and hypoxia therefore came to the hospital.  Briefly admitted to hospitalist service then transferred to the ICU requiring BiPAP.  Ultrasound lower extremity showed bilateral DVT, echocardiogram showed EF of 40-45%, grade 1 diastolic dysfunction with severe LV hypokinesia.  Completed course of remdesivir.  Still on baricitinib.  Received a course of antibiotics as well. Discussed with ID, Dr Luciana Axe, ok to dc airborne precautions.    Assessment & Plan:   Principal Problem:   Acute respiratory failure due to COVID-19 [facility]) Active Problems:   ARF (acute renal failure) (HCC)   Elevated LFTs   Acute respiratory disease due to COVID-19 virus   Pneumonia due to COVID-19 virus   Acute hypoxemic respiratory failure due to COVID-19 [facility])   Leg DVT (deep venous thromboembolism), acute, bilateral (HCC)   Chest pain  Acute hypoxic respiratory failure secondary to COVID-19 pneumonia -First day confirmed + 07/31/2020. Should be able to discontinue isolation tomorrow. -Patient is still hypoxic.  Remains on heated high flow.  Still component for both Infiltrative disease -Completed course of remdesivir -Continue baricitinib 4 mg daily -Repeat procalcitonin 0.2 -Solu-Medrol tapered off-last day 9/1.  Will restart Solu-Medrol to see if it helps her symptoms for tomorrow. -Combivent 3 times daily, incentive spirometer, flutter valve  Congestive heart failure with reduced ejection fraction, 40% with wall motion abnormality -Intermittently can get diuretics as necessary..   Acute bilateral lower extremity DVT with leg pain -Now on heparin drip -Seen by vascular to concerns of lack of pulse overnight but did not seem to think  so.  Sinus tachycardia -Suspect secondary to underlying ongoing pulmonary infiltrative process and high suspicion for pulmonary embolism.  Already on IV Heparin.   Sepsis secondary to superimposed multifocal bacterial pneumonia -This is resolved.  Completed 7-day course of antibiotics-cefepime; completed 5/29  QT prolongation This is resolved on repeat EKG  Essential hypertension -Norvasc 5 mg daily  Spoke with Dr Luciana Axe on 9/6; patient is 21 days out from his original infection. Ok to discontinue airborne precaution, patient experiencing postinflammatory changes at this time.    PT/OT is recommending CIR.  DVT prophylaxis: Heparin drip Code Status: Full code Family Communication: Wife updated Status is: Inpatient  Remains inpatient appropriate because:Inpatient level of care appropriate due to severity of illness   Dispo: The patient is from: Home              Anticipated d/c is to: Home              Anticipated d/c date is: > 3 days              Patient currently is not medically stable to d/c.  Still hypoxic, continue to work with PT/OT.  Unsafe for discharge at this time. Body mass index is 26.79 kg/m.    Subjective: Feels slightly better compared to yesterday.  Without nonrebreather his oxygen levels are sustaining greater than 90% on high flow.  To me he appears less distress and much calmer.  Denies any complaints besides left lower extremity mild pain  Review of Systems Otherwise negative except as per HPI, including: General: Denies fever, chills, night sweats or unintended weight loss. Resp: Denies cough, wheezing, shortness of breath. Cardiac: Denies chest pain, palpitations, orthopnea, paroxysmal  nocturnal dyspnea. GI: Denies abdominal pain, nausea, vomiting, diarrhea or constipation GU: Denies dysuria, frequency, hesitancy or incontinence MS: Denies muscle aches, joint pain or swelling Neuro: Denies headache, neurologic deficits (focal weakness, numbness,  tingling), abnormal gait Psych: Denies anxiety, depression, SI/HI/AVH Skin: Denies new rashes or lesions ID: Denies sick contacts, exotic exposures, travel  Examination:  Constitutional: On heated high flow saturating greater than 90% Respiratory: Bilateral rhonchi Cardiovascular: Normal sinus rhythm, no rubs Abdomen: Nontender nondistended good bowel sounds Musculoskeletal: No edema noted Skin: No rashes seen Neurologic: CN 2-12 grossly intact.  And nonfocal Psychiatric: Normal judgment and insight. Alert and oriented x 3. Normal mood.  Objective: Vitals:   08/22/20 0346 08/22/20 0749 08/22/20 0759 08/22/20 1000  BP: 119/85  124/85 118/86  Pulse: 99  98   Resp: (!) 29  (!) 32   Temp: 98.3 F (36.8 C)  98.5 F (36.9 C)   TempSrc: Axillary  Axillary   SpO2: 98% 92% 93%   Weight:      Height:        Intake/Output Summary (Last 24 hours) at 08/22/2020 1131 Last data filed at 08/21/2020 1500 Gross per 24 hour  Intake 118.77 ml  Output --  Net 118.77 ml   Filed Weights   08/17/20 0500 08/19/20 0616 08/20/20 0405  Weight: 90.2 kg 88.6 kg 89.6 kg     Data Reviewed:   CBC: Recent Labs  Lab 08/18/20 0229 08/19/20 0134 08/20/20 0500 08/21/20 0116 08/22/20 0607  WBC 16.9* 15.1* 16.9* 18.6* 17.7*  HGB 14.3 14.3 14.0 12.3* 9.6*  HCT 43.9 44.6 42.5 36.9* 29.8*  MCV 95.6 94.5 93.6 93.9 94.3  PLT 310 304 313 321 285   Basic Metabolic Panel: Recent Labs  Lab 08/18/20 0229 08/19/20 0134 08/20/20 0500 08/21/20 0116 08/22/20 0607  NA 135 132* 135 134* 130*  K 4.9 4.0 4.3 4.9 5.0  CL 101 99 98 99 94*  CO2 27 24 27 24 25   GLUCOSE 88 104* 113* 133* 125*  BUN 25* 16 22* 27* 31*  CREATININE 1.09 0.96 1.24 1.30* 1.16  CALCIUM 8.4* 8.3* 8.5* 8.5* 8.3*  MG  --   --  2.3 2.4 2.5*   GFR: Estimated Creatinine Clearance: 78 mL/min (by C-G formula based on SCr of 1.16 mg/dL). Liver Function Tests: No results for input(s): AST, ALT, ALKPHOS, BILITOT, PROT, ALBUMIN in the  last 168 hours. No results for input(s): LIPASE, AMYLASE in the last 168 hours. No results for input(s): AMMONIA in the last 168 hours. Coagulation Profile: Recent Labs  Lab 08/18/20 0229 08/19/20 0134 08/20/20 0500 08/21/20 0116 08/22/20 0607  INR 1.1 1.2 1.2 1.3* 1.2   Cardiac Enzymes: No results for input(s): CKTOTAL, CKMB, CKMBINDEX, TROPONINI in the last 168 hours. BNP (last 3 results) No results for input(s): PROBNP in the last 8760 hours. HbA1C: No results for input(s): HGBA1C in the last 72 hours. CBG: Recent Labs  Lab 08/21/20 1628 08/21/20 1934 08/22/20 0000 08/22/20 0332 08/22/20 0755  GLUCAP 98 145* 136* 125* 119*   Lipid Profile: No results for input(s): CHOL, HDL, LDLCALC, TRIG, CHOLHDL, LDLDIRECT in the last 72 hours. Thyroid Function Tests: No results for input(s): TSH, T4TOTAL, FREET4, T3FREE, THYROIDAB in the last 72 hours. Anemia Panel: No results for input(s): VITAMINB12, FOLATE, FERRITIN, TIBC, IRON, RETICCTPCT in the last 72 hours. Sepsis Labs: Recent Labs  Lab 08/21/20 0755  PROCALCITON 0.21    No results found for this or any previous visit (from the past 240  hour(s)).       Radiology Studies: No results found.      Scheduled Meds: . amLODipine  5 mg Oral Daily  . Chlorhexidine Gluconate Cloth  6 each Topical Daily  . feeding supplement (ENSURE ENLIVE)  237 mL Oral TID BM  . fluticasone  2 spray Each Nare Daily  . insulin aspart  0-15 Units Subcutaneous TID WC  . insulin aspart  0-5 Units Subcutaneous QHS  . Ipratropium-Albuterol  1 puff Inhalation TID  . multivitamin with minerals  1 tablet Oral Daily  . pantoprazole  40 mg Oral Daily  . polyethylene glycol  17 g Oral Daily   Continuous Infusions: . sodium chloride Stopped (08/12/20 2259)  . heparin 900 Units/hr (08/21/20 2211)     LOS: 16 days   Time spent= 35 mins    Henry Guida Joline Maxcy, MD [facility]  If 7PM-7AM, please contact  night-coverage  08/22/2020, 11:31 AM

## 2020-08-22 NOTE — Procedures (Signed)
Arterial Catheter Insertion Procedure Note  Henry May  [id number]  12-28-1965  Date:08/22/20  Time:11:59 PM    Provider Performing: Charlotte Sanes    Procedure: Insertion of Arterial Line (05397) with US guidance (67341)   Indication(s) Blood pressure monitoring and/or need for frequent ABGs  Consent Risks of the procedure as well as the alternatives and risks of each were explained to the patient and/or caregiver.  Consent for the procedure was obtained and is signed in the bedside chart  Anesthesia Lidocaine 1%   Time Out Verified patient identification, verified procedure, site/side was marked, verified correct patient position, special equipment/implants available, medications/allergies/relevant history reviewed, required imaging and test results available.   Sterile Technique Maximal sterile technique including full sterile barrier drape, hand hygiene, sterile gown, sterile gloves, mask, hair covering, sterile ultrasound probe cover (if used).   Procedure Description Area of catheter insertion was cleaned with chlorhexidine and draped in sterile fashion. With real-time ultrasound guidance an arterial catheter was placed into the right femoral artery.  Appropriate arterial tracings confirmed on monitor.     Complications/Tolerance None; patient tolerated the procedure well.   EBL Minimal   Specimen(s) None

## 2020-08-22 NOTE — Procedures (Signed)
Central Venous Catheter Insertion Procedure Note Henry May [id number] [birth year]  Procedure: Insertion of Central Venous Catheter Indications: Assessment of intravascular volume, Drug and/or fluid administration and Frequent blood sampling  Procedure Details Consent: Risks of procedure as well as the alternatives and risks of each were explained to the (patient/caregiver).  Consent for procedure obtained. Time Out: Verified patient identification, verified procedure, site/side was marked, verified correct patient position, special equipment/implants available, medications/allergies/relevent history reviewed, required imaging and test results available.  Performed  Maximum sterile technique was used including antiseptics, cap, gloves, gown, hand hygiene, mask and sheet. Skin prep: Chlorhexidine; local anesthetic administered A antimicrobial bonded/coated triple lumen catheter was placed in the right femoral vein due to emergent situation using the Seldinger technique.  Evaluation Blood flow good Complications: No apparent complications Patient did tolerate procedure well. CXR: N/A.  Eliezer Bottom  Internal Medicine, PGY-2 08/22/2020, 11:32 PM  Procedure performed under supervision of Dr. Sarina Ser, MD.

## 2020-08-22 NOTE — Progress Notes (Signed)
Patient called RN to room with c/o "I can't breathe, I need more O2, help me."  Pt with RR of 36 and O2 sats of 93%. Lungs unchanged on auscultation, remaining diminished. Patient alert and continuing to request ice, and for RN to turn O2 up. RN contacted MD, relayed that unit staff was reporting that patient had a similar episode a few days ago, episode was relieved with 2 mg IV morphine. MD ordered xanax. RN changed bed as patient had spilled water in bed, staff assisted to help patient wash up and position patient on right side. O2 sats 99% on 30L HHFLNC and 15 L NRB, RR 36 and HR 107 when RN left patient resting. Patient given xanax as ordered.

## 2020-08-22 NOTE — Progress Notes (Signed)
CRITICAL VALUE ALERT   Critical Value: Blood gas-pH 7.027  Date & Time Notied:  08/22/2020 9:21 PM   Provider Notified:   Orders Received/Actions taken: patient currently being intubated, to be transferred to ICU

## 2020-08-22 NOTE — Progress Notes (Signed)
PT Cancellation Note  Patient Details Name: Henry May MRN: [id number] DOB: [birth year]   Cancelled Treatment:    Reason Eval/Treat Not Completed: Fatigue/lethargy limiting ability to participate - Pt adamantly declines PT today, states "I am too hot, let me rest". PT encouraged pt to at least sit EOB, pt again declined even with max verbal encouragement. Pt states he will perform leg exercises in bed on his own, declines any further PT encouragement. Will check back tomorrow.  Richrd Sox, PT Acute Rehabilitation Services Pager [phone]  Office [phone]     Nicola Police 08/22/2020, 4:13 PM

## 2020-08-22 NOTE — Significant Event (Signed)
Rapid Response Event Note   Reason for Call :  Called as a second set of eyes for COVID positive pt on 30L HHFNC 100% and NRB. Per nursing, pt has gotten increasingly anxious t/o the day, pt is tachypneic-30s and tachycardia-100s. He received oxy 5mg  IR and xanax 0.5 PO with no change in anxiety level.  Initial Focused Assessment:  Pt laying in bed in resp distress, +WOB, +accessory muscle use. Pt is confused, lethargic, but able to answer orientation questions appropriately and move all extremities. Skin is pale, cool and clammy. T-97.8, HR-104, BP-53/17, RR-42, SpO2-100% on 30L HHFNC 100% and NRB, CBG   Interventions:  CBG-191 250 cc NS bolus, additional 500cc ordered by MD-SBP eventually up to 120s HHFNC titrated from 30L to 50L with no change in distress  Dr. notified and ordered: Combivent-unable to given d/t distress 2mg  morphine IV-unable to give d/t BP ABG-7.02/38.7/232/9.8 PCXR- Mildly improved multifocal pneumonia. LA D-dimer PCCM consulted and came to bedside: Orders to tx to ICU  Plan of Care:  While fixing to transfer pt to 2M14, he was noted to be gurgling, unresponsive, and HR had dropped to 40s. No pulses present so CPR initiated and Code Blue called(2119). Pt was given 1mg  epi at 2120 and ROSC obtained at 2121. Pt intubated by Dr. ) at 2123. SBP-50s-IO placed by IVT and levo started at 2122. Pt then transferred to 2M14 with Dr. 2124 and Code [facility] team.    Event Summary:   MD Notified: Dr 06-29-1994 notified @2000   Call Time:1910 Arrival Time:1921 End Time:2230  Marcos Eke, RN

## 2020-08-23 ENCOUNTER — Inpatient Hospital Stay (HOSPITAL_COMMUNITY): Payer: BC Managed Care – PPO

## 2020-08-23 DIAGNOSIS — R578 Other shock: Secondary | ICD-10-CM

## 2020-08-23 LAB — POCT I-STAT 7, (LYTES, BLD GAS, ICA,H+H)
Acid-base deficit: 10 mmol/L — ABNORMAL HIGH (ref 0.0–2.0)
Acid-base deficit: 4 mmol/L — ABNORMAL HIGH (ref 0.0–2.0)
Bicarbonate: 16.2 mmol/L — ABNORMAL LOW (ref 20.0–28.0)
Bicarbonate: 19.6 mmol/L — ABNORMAL LOW (ref 20.0–28.0)
Calcium, Ion: 0.89 mmol/L — CL (ref 1.15–1.40)
Calcium, Ion: 0.92 mmol/L — ABNORMAL LOW (ref 1.15–1.40)
HCT: 18 % — ABNORMAL LOW (ref 39.0–52.0)
HCT: 21 % — ABNORMAL LOW (ref 39.0–52.0)
Hemoglobin: 6.1 g/dL — CL (ref 13.0–17.0)
Hemoglobin: 7.1 g/dL — ABNORMAL LOW (ref 13.0–17.0)
O2 Saturation: 89 %
O2 Saturation: 93 %
Patient temperature: 34.7
Patient temperature: 34.8
Potassium: 5.1 mmol/L (ref 3.5–5.1)
Potassium: 5.2 mmol/L — ABNORMAL HIGH (ref 3.5–5.1)
Sodium: 132 mmol/L — ABNORMAL LOW (ref 135–145)
Sodium: 133 mmol/L — ABNORMAL LOW (ref 135–145)
TCO2: 17 mmol/L — ABNORMAL LOW (ref 22–32)
TCO2: 20 mmol/L — ABNORMAL LOW (ref 22–32)
pCO2 arterial: 27.6 mmHg — ABNORMAL LOW (ref 32.0–48.0)
pCO2 arterial: 34 mmHg (ref 32.0–48.0)
pH, Arterial: 7.272 — ABNORMAL LOW (ref 7.350–7.450)
pH, Arterial: 7.449 (ref 7.350–7.450)
pO2, Arterial: 55 mmHg — ABNORMAL LOW (ref 83.0–108.0)
pO2, Arterial: 57 mmHg — ABNORMAL LOW (ref 83.0–108.0)

## 2020-08-23 LAB — TROPONIN I (HIGH SENSITIVITY)
Troponin I (High Sensitivity): 55 ng/L — ABNORMAL HIGH (ref <18)
Troponin I (High Sensitivity): 75 ng/L — ABNORMAL HIGH (ref <18)

## 2020-08-23 LAB — LACTIC ACID, PLASMA
Lactic Acid, Venous: 2.5 mmol/L (ref 0.5–1.9)
Lactic Acid, Venous: >11 mmol/L (ref 0.5–1.9)

## 2020-08-23 LAB — GLUCOSE, CAPILLARY
Glucose-Capillary: 120 mg/dL — ABNORMAL HIGH (ref 70–99)
Glucose-Capillary: 125 mg/dL — ABNORMAL HIGH (ref 70–99)
Glucose-Capillary: 140 mg/dL — ABNORMAL HIGH (ref 70–99)
Glucose-Capillary: 151 mg/dL — ABNORMAL HIGH (ref 70–99)
Glucose-Capillary: 156 mg/dL — ABNORMAL HIGH (ref 70–99)
Glucose-Capillary: 165 mg/dL — ABNORMAL HIGH (ref 70–99)
Glucose-Capillary: 174 mg/dL — ABNORMAL HIGH (ref 70–99)

## 2020-08-23 LAB — CBC WITH DIFFERENTIAL/PLATELET
Abs Immature Granulocytes: 2.65 10*3/uL — ABNORMAL HIGH (ref 0.00–0.07)
Basophils Absolute: 0 10*3/uL (ref 0.0–0.1)
Basophils Relative: 0 %
Eosinophils Absolute: 0.1 10*3/uL (ref 0.0–0.5)
Eosinophils Relative: 0 %
HCT: 18.4 % — ABNORMAL LOW (ref 39.0–52.0)
Hemoglobin: 5.9 g/dL — CL (ref 13.0–17.0)
Immature Granulocytes: 12 %
Lymphocytes Relative: 6 %
Lymphs Abs: 1.4 10*3/uL (ref 0.7–4.0)
MCH: 31.6 pg (ref 26.0–34.0)
MCHC: 32.1 g/dL (ref 30.0–36.0)
MCV: 98.4 fL (ref 80.0–100.0)
Monocytes Absolute: 0.9 10*3/uL (ref 0.1–1.0)
Monocytes Relative: 4 %
Neutro Abs: 17.8 10*3/uL — ABNORMAL HIGH (ref 1.7–7.7)
Neutrophils Relative %: 78 %
Platelets: 245 10*3/uL (ref 150–400)
RBC: 1.87 MIL/uL — ABNORMAL LOW (ref 4.22–5.81)
RDW: 13.8 % (ref 11.5–15.5)
WBC: 22.9 10*3/uL — ABNORMAL HIGH (ref 4.0–10.5)
nRBC: 0.1 % (ref 0.0–0.2)

## 2020-08-23 LAB — PROCALCITONIN
Procalcitonin: 0.97 ng/mL
Procalcitonin: 2.44 ng/mL

## 2020-08-23 LAB — C-REACTIVE PROTEIN: CRP: 8.4 mg/dL — ABNORMAL HIGH (ref <1.0)

## 2020-08-23 LAB — CBC
HCT: 30.7 % — ABNORMAL LOW (ref 39.0–52.0)
HCT: 29.6 % — ABNORMAL LOW (ref 39.0–52.0)
Hemoglobin: 10.5 g/dL — ABNORMAL LOW (ref 13.0–17.0)
Hemoglobin: 10.2 g/dL — ABNORMAL LOW (ref 13.0–17.0)
MCH: 28.6 pg (ref 26.0–34.0)
MCH: 28.8 pg (ref 26.0–34.0)
MCHC: 34.2 g/dL (ref 30.0–36.0)
MCHC: 34.5 g/dL (ref 30.0–36.0)
MCV: 83.7 fL (ref 80.0–100.0)
MCV: 83.6 fL (ref 80.0–100.0)
Platelets: 123 10*3/uL — ABNORMAL LOW (ref 150–400)
Platelets: UNDETERMINED 10*3/uL (ref 150–400)
RBC: 3.67 MIL/uL — ABNORMAL LOW (ref 4.22–5.81)
RBC: 3.54 MIL/uL — ABNORMAL LOW (ref 4.22–5.81)
RDW: 16.7 % — ABNORMAL HIGH (ref 11.5–15.5)
RDW: 17.3 % — ABNORMAL HIGH (ref 11.5–15.5)
WBC: 16.5 10*3/uL — ABNORMAL HIGH (ref 4.0–10.5)
WBC: 17.1 10*3/uL — ABNORMAL HIGH (ref 4.0–10.5)
nRBC: 0.2 % (ref 0.0–0.2)
nRBC: 0.5 % — ABNORMAL HIGH (ref 0.0–0.2)

## 2020-08-23 LAB — BASIC METABOLIC PANEL
Anion gap: 13 (ref 5–15)
BUN: 71 mg/dL — ABNORMAL HIGH (ref 6–20)
CO2: 22 mmol/L (ref 22–32)
Calcium: 7.3 mg/dL — ABNORMAL LOW (ref 8.9–10.3)
Chloride: 101 mmol/L (ref 98–111)
Creatinine, Ser: 2.6 mg/dL — ABNORMAL HIGH (ref 0.61–1.24)
GFR calc Af Amer: 31 mL/min — ABNORMAL LOW (ref >60)
GFR calc non Af Amer: 26 mL/min — ABNORMAL LOW (ref >60)
Glucose, Bld: 144 mg/dL — ABNORMAL HIGH (ref 70–99)
Potassium: 4.4 mmol/L (ref 3.5–5.1)
Sodium: 136 mmol/L (ref 135–145)

## 2020-08-23 LAB — COMPREHENSIVE METABOLIC PANEL
ALT: 1881 U/L — ABNORMAL HIGH (ref 0–44)
AST: 1640 U/L — ABNORMAL HIGH (ref 15–41)
Albumin: 1.6 g/dL — ABNORMAL LOW (ref 3.5–5.0)
Alkaline Phosphatase: 78 U/L (ref 38–126)
Anion gap: 22 — ABNORMAL HIGH (ref 5–15)
BUN: 47 mg/dL — ABNORMAL HIGH (ref 6–20)
CO2: 15 mmol/L — ABNORMAL LOW (ref 22–32)
Calcium: 6.9 mg/dL — ABNORMAL LOW (ref 8.9–10.3)
Chloride: 99 mmol/L (ref 98–111)
Creatinine, Ser: 3.02 mg/dL — ABNORMAL HIGH (ref 0.61–1.24)
GFR calc Af Amer: 26 mL/min — ABNORMAL LOW (ref >60)
GFR calc non Af Amer: 22 mL/min — ABNORMAL LOW (ref >60)
Glucose, Bld: 127 mg/dL — ABNORMAL HIGH (ref 70–99)
Potassium: 5.3 mmol/L — ABNORMAL HIGH (ref 3.5–5.1)
Sodium: 136 mmol/L (ref 135–145)
Total Bilirubin: 1 mg/dL (ref 0.3–1.2)
Total Protein: 4.6 g/dL — ABNORMAL LOW (ref 6.5–8.1)

## 2020-08-23 LAB — PROTIME-INR
INR: 1.8 — ABNORMAL HIGH (ref 0.8–1.2)
INR: 2.1 — ABNORMAL HIGH (ref 0.8–1.2)
Prothrombin Time: 20.3 seconds — ABNORMAL HIGH (ref 11.4–15.2)
Prothrombin Time: 23 seconds — ABNORMAL HIGH (ref 11.4–15.2)

## 2020-08-23 LAB — PREPARE RBC (CROSSMATCH)

## 2020-08-23 LAB — MAGNESIUM: Magnesium: 2.7 mg/dL — ABNORMAL HIGH (ref 1.7–2.4)

## 2020-08-23 LAB — FIBRINOGEN: Fibrinogen: 381 mg/dL (ref 210–475)

## 2020-08-23 LAB — D-DIMER, QUANTITATIVE: D-Dimer, Quant: 7 ug/mL-FEU — ABNORMAL HIGH (ref 0.00–0.50)

## 2020-08-23 LAB — LACTATE DEHYDROGENASE: LDH: 7032 U/L — ABNORMAL HIGH (ref 98–192)

## 2020-08-23 MED ORDER — METRONIDAZOLE IN NACL 5-0.79 MG/ML-% IV SOLN
500.0000 mg | Freq: Three times a day (TID) | INTRAVENOUS | Status: DC
  Administered 2020-08-23 – 2020-08-24 (×4): 500 mg via INTRAVENOUS
  Filled 2020-08-23 (×4): qty 100

## 2020-08-23 MED ORDER — SODIUM CHLORIDE 0.9 % IV SOLN
2.0000 g | INTRAVENOUS | Status: DC
  Administered 2020-08-23 – 2020-08-24 (×2): 2 g via INTRAVENOUS
  Filled 2020-08-23 (×2): qty 2

## 2020-08-23 MED ORDER — INSULIN ASPART 100 UNIT/ML ~~LOC~~ SOLN
0.0000 [IU] | SUBCUTANEOUS | Status: DC
  Administered 2020-08-23 – 2020-08-25 (×6): 2 [IU] via SUBCUTANEOUS
  Administered 2020-08-26 – 2020-08-27 (×3): 3 [IU] via SUBCUTANEOUS
  Administered 2020-08-27 – 2020-08-28 (×2): 2 [IU] via SUBCUTANEOUS
  Administered 2020-08-28 – 2020-08-29 (×5): 3 [IU] via SUBCUTANEOUS

## 2020-08-23 MED ORDER — PANTOPRAZOLE SODIUM 40 MG IV SOLR
40.0000 mg | INTRAVENOUS | Status: DC
  Administered 2020-08-23 – 2020-08-29 (×7): 40 mg via INTRAVENOUS
  Filled 2020-08-23 (×7): qty 40

## 2020-08-23 MED ORDER — PROSOURCE TF PO LIQD
90.0000 mL | Freq: Three times a day (TID) | ORAL | Status: DC
  Administered 2020-08-23 – 2020-08-24 (×4): 90 mL
  Filled 2020-08-23 (×5): qty 90

## 2020-08-23 MED ORDER — GUAIFENESIN-DM 100-10 MG/5ML PO SYRP
10.0000 mL | ORAL_SOLUTION | ORAL | Status: DC | PRN
  Filled 2020-08-23: qty 10

## 2020-08-23 MED ORDER — ACETAMINOPHEN 160 MG/5ML PO SOLN: 650.0000 mg | Freq: Four times a day (QID) | ORAL | Status: DC | PRN

## 2020-08-23 MED ORDER — IPRATROPIUM-ALBUTEROL 0.5-2.5 (3) MG/3ML IN SOLN
3.0000 mL | Freq: Two times a day (BID) | RESPIRATORY_TRACT | Status: DC
  Administered 2020-08-23 – 2020-08-24 (×3): 3 mL via RESPIRATORY_TRACT
  Filled 2020-08-23 (×3): qty 3

## 2020-08-23 MED ORDER — CALCIUM GLUCONATE-NACL 1-0.675 GM/50ML-% IV SOLN
1.0000 g | Freq: Once | INTRAVENOUS | Status: AC
  Administered 2020-08-23: 1000 mg via INTRAVENOUS
  Filled 2020-08-23: qty 50

## 2020-08-23 MED ORDER — SODIUM CHLORIDE 0.9 % IV BOLUS
1000.0000 mL | Freq: Once | INTRAVENOUS | Status: AC
  Administered 2020-08-22: 1000 mL via INTRAVENOUS

## 2020-08-23 MED ORDER — POLYETHYLENE GLYCOL 3350 17 G PO PACK
17.0000 g | PACK | Freq: Every day | ORAL | Status: DC
  Administered 2020-08-23 – 2020-08-25 (×3): 17 g
  Filled 2020-08-23 (×2): qty 1

## 2020-08-23 MED ORDER — SODIUM CHLORIDE 0.9% IV SOLUTION: Freq: Once | INTRAVENOUS | Status: DC

## 2020-08-23 MED ORDER — VANCOMYCIN VARIABLE DOSE PER UNSTABLE RENAL FUNCTION (PHARMACIST DOSING): Status: DC

## 2020-08-23 MED ORDER — DOCUSATE SODIUM 50 MG/5ML PO LIQD
100.0000 mg | Freq: Two times a day (BID) | ORAL | Status: DC
  Administered 2020-08-23 – 2020-08-24 (×4): 100 mg
  Filled 2020-08-23 (×3): qty 10

## 2020-08-23 MED ORDER — IPRATROPIUM-ALBUTEROL 0.5-2.5 (3) MG/3ML IN SOLN: 3.0000 mL | Freq: Four times a day (QID) | RESPIRATORY_TRACT | Status: DC

## 2020-08-23 MED ORDER — VANCOMYCIN HCL 1750 MG/350ML IV SOLN
1750.0000 mg | Freq: Once | INTRAVENOUS | Status: AC
  Administered 2020-08-23: 1750 mg via INTRAVENOUS
  Filled 2020-08-23: qty 350

## 2020-08-23 MED ORDER — METHYLPREDNISOLONE SODIUM SUCC 40 MG IJ SOLR
40.0000 mg | Freq: Every day | INTRAMUSCULAR | Status: DC
  Administered 2020-08-24 – 2020-08-30 (×7): 40 mg via INTRAVENOUS
  Filled 2020-08-23 (×7): qty 1

## 2020-08-23 MED ORDER — VITAL 1.5 CAL PO LIQD
1000.0000 mL | ORAL | Status: DC
  Administered 2020-08-23: 1000 mL

## 2020-08-23 MED ORDER — ADULT MULTIVITAMIN LIQUID CH
15.0000 mL | Freq: Every day | ORAL | Status: DC
  Administered 2020-08-23 – 2020-08-24 (×2): 15 mL via ORAL
  Filled 2020-08-23 (×2): qty 15

## 2020-08-23 MED ORDER — SODIUM CHLORIDE 0.9% IV SOLUTION: Freq: Once | INTRAVENOUS | Status: AC

## 2020-08-23 MED FILL — Medication: Qty: 1 | Status: AC

## 2020-08-23 NOTE — Progress Notes (Signed)
CRITICAL VALUE ALERT  Critical Value:  Troponin 75  Date & Time Notied:   08/23/2020 @ 20:07  Provider Notified: Pola Corn - Dr. Benjamin Stain  Orders Received/Actions taken: TBD

## 2020-08-23 NOTE — Progress Notes (Signed)
eLink Physician-Brief Progress Note Patient Name: Henry May DOB: 1966-03-02 MRN: [id number]   Date of Service  08/23/2020  HPI/Events of Note  RN requests ISS be changed to Q4H in this patient who is receiving tube feeds.   eICU Interventions  Changed ISS as above.     Intervention Category Intermediate Interventions: Hyperglycemia - evaluation and treatment  Janae Bridgeman 08/23/2020, 8:18 PM

## 2020-08-23 NOTE — Progress Notes (Signed)
RT transported pt to and from CT without event. 

## 2020-08-23 NOTE — Progress Notes (Signed)
Pharmacy Antibiotic Note  Henry May is a 54 y.o. male admitted on 08/06/2020 with sepsis.  Pharmacy has been consulted for Vancomycin and Cefepime dosing. Pt also started on Flagyl.  Plan: Cefepime 2gm IV q24h Vancomycin 1750mg  IV now. F/u renal function for further Vanc dosing with large rise in SCr Will f/u renal function, micro data, and pt's clinical condition Vanc levels prn   Height: 6' (182.9 cm) Weight: 89.6 kg (197 lb 8.5 oz) IBW/kg (Calculated) : 77.6  Temp (24hrs), Avg:96.9 F (36.1 C), Min:93.9 F (34.4 C), Max:98.5 F (36.9 C)  Recent Labs  Lab 08/19/20 0134 08/20/20 0500 08/21/20 0116 08/22/20 0607 08/22/20 2102 08/23/20 0011  WBC 15.1* 16.9* 18.6* 17.7*  --  22.9*  CREATININE 0.96 1.24 1.30* 1.16  --  3.02*  LATICACIDVEN  --   --   --   --  >11.0* >11.0*    Estimated Creatinine Clearance: 30 mL/min (A) (by C-G formula based on SCr of 3.02 mg/dL (H)).    No Known Allergies  Antimicrobials this admission: 9/8 Vanc >> 9/8 Cefepime >> 9/8 Flagyl >> 8/23 Azith/Rocephin x 1 8/23 Vanc >> 8/25 8/23 Cefepime >> 8/29 8/23 Remdesivir >>8/27 8/25 baricitinib x 14 (9/8) 8/23 SM >> 9/2  8/25 strep pneumo/leg - neg 8/22 BCx: neg 8/22 Covid: positive MRSA ns - neg8/23 Azith/Rocephin x 1 8/23 Vanc >> 8/25 8/23 Cefepime >> 8/29 8/23 Remdesivir >>8/27 8/25 baricitinib x 14 (9/8) 8/23 SM >> 9/2  8/25 strep pneumo/leg - neg 8/22 BCx: neg 8/22 Covid: positive MRSA ns - neg   Thank you for allowing pharmacy to be a part of this patients care.  9/22, PharmD, BCPS Please see amion for complete clinical pharmacist phone list 08/23/2020 2:52 AM

## 2020-08-23 NOTE — Procedures (Signed)
Intubation Procedure Note  Henry May  [id number]  February 04, 1966  Date:08/23/20  Time:3:20 AM   Provider Performing:Dempsey Ahonen Duwayne Heck    Procedure: Intubation (77414)  Indication(s) Respiratory Failure  Consent Unable to obtain consent due to emergent nature of procedure.   Anesthesia none   Time Out Verified patient identification, verified procedure, site/side was marked, verified correct patient position, special equipment/implants available, medications/allergies/relevant history reviewed, required imaging and test results available.   Sterile Technique Usual hand hygeine, masks, and gloves were used   Procedure Description Patient positioned in bed supine.  Sedation given as noted above.  Patient was intubated with endotracheal tube using mac 4.  View was Grade 2 only posterior commissure .  Number of attempts was 2.  First attempt could only see epiglottis.  Removed dentures, then made successful second attempt.  Colorimetric CO2 detector was consistent with tracheal placement.   Complications/Tolerance None; patient tolerated the procedure well. Chest X-ray is ordered to verify placement.   EBL none   Specimen(s) None

## 2020-08-23 NOTE — Progress Notes (Signed)
PT Cancellation Note  Patient Details Name: Henry May MRN: [id number] DOB: [birth year]   Cancelled Treatment:    Reason Eval/Treat Not Completed: Medical issues which prohibited therapy;Patient not medically ready.  PEA 9/7, transfer to ICU. 08/23/2020  Jacinto Halim., PT Acute Rehabilitation Services [phone]  (pager) [phone]  (office)   [name] [name] 08/23/2020, 9:46 AM

## 2020-08-23 NOTE — Consult Note (Addendum)
Chief Complaint: Patient was seen in consultation today for DVT/IVCF placement.  Referring Physician(s): Icard, Henry May (PCCM)  Supervising Physician: Henry May, Henry  Patient Status: [facility] - In[facility]pt  History of Present Illness: Henry May is a 54 y.o. male with an unremarkable past medical history who has been admitted to [facility] since 08/07/2020 for management of COVID-19 infection and associated respiratory failure. Was found to have acute bilateral DVT on admission and started on IV Heparin for management. Hospital course complicated by progressive respiratory failure along with hypotension/PEA (secondary to hemorrhagic shock) requiring intubation and CPR with ROSC 08/22/2020. He was transferred to ICU for further management. Follow-up CT abdomen/pelvis revealed large left retroperitoneal hematoma, thought to be cause of hemorrhagic shock- IV Heparin was held due to this.  CT abdomen/pelvis today: 1. Extensive ground-glass opacities throughout the lungs most compatible with pneumonia, likely COVID pneumonia. 2. Pneumomediastinum, extending into the lower neck soft tissues. 3. Small right apical pneumothorax. 4. Large left retroperitoneal hematoma. 5. Bilateral small nonobstructing renal stones.  No hydronephrosis.  IR requested by Dr. Tonia May for possible image-guided IVCF placement. Patient laying in bed intubated with sedation. He does not respond to voice and does not follow simple commands. Wife and daughter at bedside. History difficult to obtain secondary to intubation/sedation.   History reviewed. No pertinent past medical history.  History reviewed. No pertinent surgical history.  Allergies: Patient has no known allergies.  Medications: Prior to Admission medications   Medication Sig Start Date End Date Taking? Authorizing Provider  acetaminophen (TYLENOL) 325 MG tablet Take 325-650 mg by mouth every 6 (six) hours as needed for mild pain, fever or headache.   Yes  [provider]  aspirin EC 81 MG tablet Take 81 mg by mouth daily. Swallow whole.   Yes [provider]  naproxen sodium (ALEVE) 220 MG tablet Take 220 mg by mouth 2 (two) times daily as needed (fever, aches).   Yes [provider]  ampicillin (PRINCIPEN) 500 MG capsule Take 1,000 mg by mouth once.    [provider]     Family History  Problem Relation Age of Onset  . Diabetes Mellitus II Neg Hx     Social History   Socioeconomic History  . Marital status: Married    Spouse name: Not on file  . Number of children: Not on file  . Years of education: Not on file  . Highest education level: Not on file  Occupational History  . Not on file  Tobacco Use  . Smoking status: Never Smoker  . Smokeless tobacco: Never Used  Substance and Sexual Activity  . Alcohol use: Not on file  . Drug use: Not on file  . Sexual activity: Not on file  Other Topics Concern  . Not on file  Social History Narrative  . Not on file   Social Determinants of Health   Financial Resource Strain:   . Difficulty of Paying Living Expenses: Not on file  Food Insecurity:   . Worried About Programme researcher, broadcasting/film/videounning Out of Food in the Last Year: Not on file  . Ran Out of Food in the Last Year: Not on file  Transportation Needs:   . Lack of Transportation (Medical): Not on file  . Lack of Transportation (Non-Medical): Not on file  Physical Activity:   . Days of Exercise per Week: Not on file  . Minutes of Exercise per Session: Not on file  Stress:   . Feeling of Stress : Not on file  Social Connections:   . Frequency of Communication with Friends and Family: Not on file  . Frequency of Social Gatherings with Friends and Family: Not on file  . Attends Religious Services: Not on file  . Active Member of Clubs or Organizations: Not on file  . Attends Banker Meetings: Not on file  . Marital Status: Not on file     Review of Systems: A 12 point ROS discussed and pertinent  positives are indicated in the HPI above.  All other systems are negative.  Review of Systems  Unable to perform ROS: Intubated    Vital Signs: BP 111/74   Pulse 61   Temp (!) 95.9 F (35.5 C) (Bladder)   Resp (!) 31   Ht 6' (1.829 m)   Wt 197 lb 8.5 oz (89.6 kg)   SpO2 100%   BMI 26.79 kg/m   Physical Exam Vitals and nursing note reviewed.  Constitutional:      General: He is not in acute distress.    Comments: Intubated with sedation.  Cardiovascular:     Rate and Rhythm: Regular rhythm. Tachycardia present.     Heart sounds: No murmur heard.   Pulmonary:     Effort: Pulmonary effort is normal. No respiratory distress.     Breath sounds: Normal breath sounds. No wheezing.     Comments: Intubated with sedation. Skin:    General: Skin is warm and dry.      MD Evaluation Airway:  (Intubated.) Heart: WNL Abdomen: WNL Chest/ Lungs: WNL ASA  Classification: 3 Mallampati/Airway Score: Three (Intubated.)   Imaging: CT ABDOMEN PELVIS WO CONTRAST  Addendum Date: 08/23/2020   ADDENDUM REPORT: 08/23/2020 03:14 ADDENDUM: Critical Value/emergent results were called by telephone at the time of interpretation on 08/23/2020 at 3:14 am to provider Henry May , who verbally acknowledged these results. Electronically Signed   By: Charlett Nose M.D.   On: 08/23/2020 03:14   Result Date: 08/23/2020 CLINICAL DATA:  Respiratory failure. Possible intra-abdominal hemorrhage. Recent COVID positive. EXAM: CT CHEST, ABDOMEN AND PELVIS WITHOUT CONTRAST TECHNIQUE: Multidetector CT imaging of the chest, abdomen and pelvis was performed following the standard protocol without IV contrast. COMPARISON:  None. FINDINGS: CT CHEST FINDINGS Cardiovascular: Heart is normal size.  Aorta normal caliber. Mediastinum/Nodes: Endotracheal tube tip is in the lower trachea. There is pneumomediastinum throughout the mediastinum and extending in to the lower neck. No adenopathy. Lungs/Pleura: Small right apical  pneumothorax. Extensive bilateral ground-glass airspace disease. No effusions. Musculoskeletal: Chest wall soft tissues are unremarkable. No acute bony abnormality. CT ABDOMEN PELVIS FINDINGS Hepatobiliary: No focal hepatic abnormality. Gallbladder unremarkable. Pancreas: No focal abnormality or ductal dilatation. Spleen: No focal abnormality.  Normal size. Adrenals/Urinary Tract: Small bilateral nonobstructing renal stones. No ureteral stones or hydronephrosis. Foley catheter is present in the bladder which is decompressed. Adrenals unremarkable. Stomach/Bowel: Stomach, large and small bowel grossly unremarkable. NG tube is in the distal stomach. Vascular/Lymphatic: No evidence of aneurysm or adenopathy. Reproductive: No visible focal abnormality. Other: No free fluid or free air. Large heterogeneous area noted within the left retroperitoneum involving the left psoas and iliopsoas muscles compatible with retroperitoneal hematoma. This measures approximately 12.8 x 9.3 cm on axial imaging and extends from posterior to the left kidney down near the left groin region. Musculoskeletal: No acute bony abnormality. IMPRESSION: Extensive ground-glass opacities throughout the lungs most compatible with pneumonia, likely COVID pneumonia. Pneumomediastinum, extending into the lower neck soft tissues. Small right apical pneumothorax. Large left retroperitoneal hematoma.  Bilateral small nonobstructing renal stones.  No hydronephrosis. Attempts are being made to contact the ordering physician or physician on-call. Electronically Signed: By: Charlett Nose M.D. On: 08/23/2020 03:03   DG Chest 1 View  Result Date: 08/22/2020 CLINICAL DATA:  Intubation EXAM: CHEST  1 VIEW COMPARISON:  08/22/2020 FINDINGS: Endotracheal tube is 3.5 cm above the carina. Multifocal airspace opacities throughout the lungs are similar prior study. Heart is borderline in size. No effusions or pneumothorax. IMPRESSION: Endotracheal tube 3.5 cm above the  carina. Otherwise no significant change. Electronically Signed   By: Charlett Nose M.D.   On: 08/22/2020 22:06   CT CHEST WO CONTRAST  Addendum Date: 08/23/2020   ADDENDUM REPORT: 08/23/2020 03:14 ADDENDUM: Critical Value/emergent results were called by telephone at the time of interpretation on 08/23/2020 at 3:14 am to provider [facility] , who verbally acknowledged these results. Electronically Signed   By: Charlett Nose M.D.   On: 08/23/2020 03:14   Result Date: 08/23/2020 CLINICAL DATA:  Respiratory failure. Possible intra-abdominal hemorrhage. Recent COVID positive. EXAM: CT CHEST, ABDOMEN AND PELVIS WITHOUT CONTRAST TECHNIQUE: Multidetector CT imaging of the chest, abdomen and pelvis was performed following the standard protocol without IV contrast. COMPARISON:  None. FINDINGS: CT CHEST FINDINGS Cardiovascular: Heart is normal size.  Aorta normal caliber. Mediastinum/Nodes: Endotracheal tube tip is in the lower trachea. There is pneumomediastinum throughout the mediastinum and extending in to the lower neck. No adenopathy. Lungs/Pleura: Small right apical pneumothorax. Extensive bilateral ground-glass airspace disease. No effusions. Musculoskeletal: Chest wall soft tissues are unremarkable. No acute bony abnormality. CT ABDOMEN PELVIS FINDINGS Hepatobiliary: No focal hepatic abnormality. Gallbladder unremarkable. Pancreas: No focal abnormality or ductal dilatation. Spleen: No focal abnormality.  Normal size. Adrenals/Urinary Tract: Small bilateral nonobstructing renal stones. No ureteral stones or hydronephrosis. Foley catheter is present in the bladder which is decompressed. Adrenals unremarkable. Stomach/Bowel: Stomach, large and small bowel grossly unremarkable. NG tube is in the distal stomach. Vascular/Lymphatic: No evidence of aneurysm or adenopathy. Reproductive: No visible focal abnormality. Other: No free fluid or free air. Large heterogeneous area noted within the left retroperitoneum involving  the left psoas and iliopsoas muscles compatible with retroperitoneal hematoma. This measures approximately 12.8 x 9.3 cm on axial imaging and extends from posterior to the left kidney down near the left groin region. Musculoskeletal: No acute bony abnormality. IMPRESSION: Extensive ground-glass opacities throughout the lungs most compatible with pneumonia, likely COVID pneumonia. Pneumomediastinum, extending into the lower neck soft tissues. Small right apical pneumothorax. Large left retroperitoneal hematoma. Bilateral small nonobstructing renal stones.  No hydronephrosis. Attempts are being made to contact the ordering physician or physician on-call. Electronically Signed: By: Charlett Nose M.D. On: 08/23/2020 03:03   US Abdomen Complete  Result Date: 08/23/2020 CLINICAL DATA:  Elevated LFTs EXAM: ABDOMEN ULTRASOUND COMPLETE COMPARISON:  None. FINDINGS: Gallbladder: No gallstones or wall thickening visualized. Due to the patient's condition ultrasound Murphy's sign could not be elicited. No cholelithiasis is noted. Gallbladder sludge is noted. Common bile duct: Diameter: 2.4 mm. Liver: Generalized increased echogenicity is noted throughout the liver consistent with fatty infiltration. There are 2 areas of relative decreased attenuation identified within the right lobe of the liver. The largest of these measures 2.7 cm in greatest dimension. These may represent areas of focal fatty sparing although incompletely evaluated on this exam and not visualized on recent CT due to lack of contrast. Portal vein is patent on color Doppler imaging with normal direction of blood flow towards the liver. IVC:  No abnormality visualized. Pancreas: Not well visualized due to overlying bowel gas. Spleen: Size and appearance within normal limits. Right Kidney: Length: 12.3 cm. Echogenicity within normal limits. No mass or hydronephrosis visualized. Left Kidney: Length: 13.1 cm. Echogenicity within normal limits. No mass or  hydronephrosis visualized. Abdominal aorta: No aneurysm visualized. Other findings: The known left retroperitoneal hematoma is again identified in the left flank and left pericolic gutter region IMPRESSION: Somewhat limited exam due the patient's current status. Fatty infiltration of the liver with areas of decreased echogenicity in the right lobe of the liver which may represent fatty infiltration. The need for further evaluation can be determined when the patient's clinical picture improves. Changes consistent with the known left retroperitoneal hematoma. Electronically Signed   By: Alcide Clever M.D.   On: 08/23/2020 09:29   DG CHEST PORT 1 VIEW  Result Date: 08/22/2020 CLINICAL DATA:  Hypoxia. EXAM: PORTABLE CHEST 1 VIEW COMPARISON:  Chest x-ray dated August 18, 2020. FINDINGS: Stable cardiomediastinal silhouette. Bilateral peripheral basilar predominant interstitial opacities have mildly improved. No pleural effusion or pneumothorax. No acute osseous abnormality. IMPRESSION: 1. Mildly improved multifocal pneumonia. Electronically Signed   By: Obie Dredge M.D.   On: 08/22/2020 20:56   DG CHEST PORT 1 VIEW  Result Date: 08/18/2020 CLINICAL DATA:  Respiratory failure. EXAM: PORTABLE CHEST 1 VIEW COMPARISON:  August 15, 2020. FINDINGS: Stable cardiomegaly. No pneumothorax or pleural effusion is noted. Stable bilateral patchy airspace opacities are noted consistent with multifocal pneumonia. Bony thorax is unremarkable. IMPRESSION: Stable bilateral multifocal pneumonia. Electronically Signed   By: Lupita Raider M.D.   On: 08/18/2020 09:18   DG CHEST PORT 1 VIEW  Result Date: 08/15/2020 CLINICAL DATA:  COVID positive.  Acute respiratory failure. EXAM: PORTABLE CHEST 1 VIEW COMPARISON:  08/12/2020 FINDINGS: Similar appearance of patchy bilateral interstitial and airspace disease, left greater than right. Lung volumes remain low without pleural effusion. Cardiopericardial silhouette is at upper limits  of normal for size. The visualized bony structures of the thorax show now acute abnormality. Telemetry leads overlie the chest. IMPRESSION: No substantial change in patchy bilateral airspace disease consistent with multifocal pneumonia. Electronically Signed   By: Kennith May M.D.   On: 08/15/2020 10:39   DG CHEST PORT 1 VIEW  Result Date: 08/12/2020 CLINICAL DATA:  COVID-19 positive. EXAM: PORTABLE CHEST 1 VIEW COMPARISON:  August 09, 2020 FINDINGS: Bilateral pulmonary infiltrates have worsened in the interval. No pneumothorax. No nodule or mass. No other acute abnormalities. IMPRESSION: Worsening bilateral pulmonary infiltrates. Electronically Signed   By: Gerome Sam III M.D   On: 08/12/2020 15:58   DG CHEST PORT 1 VIEW  Result Date: 08/09/2020 CLINICAL DATA:  Pneumonia.  COVID-19 positive EXAM: PORTABLE CHEST 1 VIEW COMPARISON:  August 06, 2020 FINDINGS: There is patchy airspace opacity bilaterally, slightly more on the left overall than on the right, stable. No new opacity evident. Heart is mildly enlarged with pulmonary vascularity normal. No adenopathy. No bone lesions. IMPRESSION: Multifocal airspace opacity consistent with multifocal pneumonia, similar to recent study. No frank consolidation. Stable cardiac prominence. No adenopathy evident. Electronically Signed   By: Bretta Bang III M.D.   On: 08/09/2020 08:28   DG Chest Portable 1 View  Result Date: 08/06/2020 CLINICAL DATA:  COVID positive. EXAM: PORTABLE CHEST 1 VIEW COMPARISON:  None. FINDINGS: Mild infiltrates are seen along the periphery of the right lung with mild to moderate severity left basilar infiltrate noted. There is no evidence of a pleural effusion  or pneumothorax. The heart size and mediastinal contours are within normal limits. The visualized skeletal structures are unremarkable. IMPRESSION: Mild right lung infiltrates with mild to moderate severity left basilar infiltrate. Electronically Signed   By: Aram Candela M.D.   On: 08/06/2020 19:45   ECHOCARDIOGRAM COMPLETE  Result Date: 08/07/2020    ECHOCARDIOGRAM REPORT   Patient Name:   DELMOS VELAQUEZ Date of Exam: 08/07/2020 Medical Rec #:  [id number]      Height:       72.0 in Accession #:    [id number]     Weight:       235.0 lb Date of Birth:  Jun 03, 1966      BSA:          2.282 m Patient Age:    54 years       BP:           132/103 mmHg Patient Gender: M              HR:           97 bpm. Exam Location:  Inpatient Procedure: 2D Echo, Cardiac Doppler and Color Doppler Indications:    Acute Respiratory Insufficiency 5018.82 / R06.89  History:        Patient has no prior history of Echocardiogram examinations.                 COVID-19 Positive.  Sonographer:    Tiffany Dance Referring Phys: [id number] Charlotte Sanes IMPRESSIONS  1. Left ventricular ejection fraction, by estimation, is 40 to 45%. The left ventricle has mildly decreased function. The left ventricle demonstrates regional wall motion abnormalities (see scoring diagram/findings for description). There is mild left ventricular hypertrophy. Left ventricular diastolic parameters are consistent with Grade I diastolic dysfunction (impaired relaxation). There is severe hypokinesis of the left ventricular, basal-mid inferior wall.  2. Right ventricular systolic function is mildly reduced. The right ventricular size is mildly enlarged.  3. Right atrial size was moderately dilated.  4. The mitral valve is grossly normal. No evidence of mitral valve regurgitation.  5. The aortic valve is tricuspid. Aortic valve regurgitation is not visualized.  6. Aortic dilatation noted. There is borderline dilatation at the level of the sinuses of Valsalva measuring 38 mm.  7. The inferior vena cava is normal in size with <50% respiratory variability, suggesting right atrial pressure of 8 mmHg. FINDINGS  Left Ventricle: Left ventricular ejection fraction, by estimation, is 40 to 45%. The left ventricle has mildly decreased  function. The left ventricle demonstrates regional wall motion abnormalities. Severe hypokinesis of the left ventricular, basal-mid inferior wall. The left ventricular internal cavity size was normal in size. There is mild left ventricular hypertrophy. Left ventricular diastolic parameters are consistent with Grade I diastolic dysfunction (impaired relaxation). Indeterminate filling pressures. Right Ventricle: The right ventricular size is mildly enlarged. No increase in right ventricular wall thickness. Right ventricular systolic function is mildly reduced. Left Atrium: Left atrial size was normal in size. Right Atrium: Right atrial size was moderately dilated. Pericardium: There is no evidence of pericardial effusion. Mitral Valve: The mitral valve is grossly normal. No evidence of mitral valve regurgitation. Tricuspid Valve: The tricuspid valve is grossly normal. Tricuspid valve regurgitation is not demonstrated. Aortic Valve: The aortic valve is tricuspid. Aortic valve regurgitation is not visualized. Pulmonic Valve: The pulmonic valve was not well visualized. Pulmonic valve regurgitation is not visualized. Aorta: Aortic dilatation noted. There is borderline dilatation at the level of the sinuses of  Valsalva measuring 38 mm. Venous: The inferior vena cava is normal in size with less than 50% respiratory variability, suggesting right atrial pressure of 8 mmHg. IAS/Shunts: No atrial level shunt detected by color flow Doppler.  LEFT VENTRICLE PLAX 2D LVIDd:         4.97 cm  Diastology LVIDs:         3.91 cm  LV e' lateral: 5.33 cm/s LV PW:         1.16 cm  LV e' medial:  4.24 cm/s LV IVS:        0.97 cm LVOT diam:     2.10 cm LV SV:         48 LV SV Index:   21 LVOT Area:     3.46 cm  RIGHT VENTRICLE            IVC RV Basal diam:  3.34 cm    IVC diam: 1.85 cm RV Mid diam:    2.57 cm RV S prime:     8.81 cm/s TAPSE (M-mode): 2.2 cm LEFT ATRIUM             Index       RIGHT ATRIUM           Index LA diam:         3.00 cm 1.31 cm/m  RA Area:     25.10 cm LA Vol (A2C):   33.6 ml 14.73 ml/m RA Volume:   90.40 ml  39.62 ml/m LA Vol (A4C):   39.3 ml 17.22 ml/m LA Biplane Vol: 37.1 ml 16.26 ml/m  AORTIC VALVE LVOT Vmax:   77.60 cm/s LVOT Vmean:  49.300 cm/s LVOT VTI:    0.138 m  AORTA Ao Root diam: 3.80 cm Ao Asc diam:  3.30 cm MV A velocity: 68.65 cm/s                            SHUNTS                            Systemic VTI:  0.14 m                            Systemic Diam: 2.10 cm Zoila Shutter MD Electronically signed by Zoila Shutter MD Signature Date/Time: 08/07/2020/3:22:46 PM    Final    VAS [place] LOWER EXTREMITY VENOUS (DVT)  Result Date: 08/07/2020  Lower Venous DVTStudy Indications: Edema, and Covid positive.  Comparison Study: No prior. Performing Technologist: Marilynne Halsted RDMS, RVT  Examination Guidelines: A complete evaluation includes B-mode imaging, spectral Doppler, color Doppler, and power Doppler as needed of all accessible portions of each vessel. Bilateral testing is considered an integral part of a complete examination. Limited examinations for reoccurring indications may be performed as noted. The reflux portion of the exam is performed with the patient in reverse Trendelenburg.  +---------+---------------+---------+-----------+----------+--------------+ RIGHT    CompressibilityPhasicitySpontaneityPropertiesThrombus Aging +---------+---------------+---------+-----------+----------+--------------+ CFV      Full           Yes      Yes                                 +---------+---------------+---------+-----------+----------+--------------+ SFJ      Full                                                        +---------+---------------+---------+-----------+----------+--------------+  FV Prox  Full                                                        +---------+---------------+---------+-----------+----------+--------------+ FV Mid   Full                                                         +---------+---------------+---------+-----------+----------+--------------+ FV DistalFull                                                        +---------+---------------+---------+-----------+----------+--------------+ PFV      Full                                                        +---------+---------------+---------+-----------+----------+--------------+ POP      Full           Yes      Yes                                 +---------+---------------+---------+-----------+----------+--------------+ PTV      Full                                                        +---------+---------------+---------+-----------+----------+--------------+ PERO     Partial                                      Acute          +---------+---------------+---------+-----------+----------+--------------+   +---------+---------------+---------+-----------+----------+--------------+ LEFT     CompressibilityPhasicitySpontaneityPropertiesThrombus Aging +---------+---------------+---------+-----------+----------+--------------+ CFV      Full           Yes      Yes                                 +---------+---------------+---------+-----------+----------+--------------+ SFJ      Full                                                        +---------+---------------+---------+-----------+----------+--------------+ FV Prox  Full                                                        +---------+---------------+---------+-----------+----------+--------------+  FV Mid   Full                                                        +---------+---------------+---------+-----------+----------+--------------+ FV DistalNone           No       No                   Acute          +---------+---------------+---------+-----------+----------+--------------+ PFV      Full                                                         +---------+---------------+---------+-----------+----------+--------------+ POP      None           No       No                   Acute          +---------+---------------+---------+-----------+----------+--------------+ PTV      None                                         Acute          +---------+---------------+---------+-----------+----------+--------------+ PERO     None                                         Acute          +---------+---------------+---------+-----------+----------+--------------+     Summary: RIGHT: - Findings consistent with acute deep vein thrombosis involving the right peroneal veins. - No cystic structure found in the popliteal fossa.  LEFT: - Findings consistent with acute deep vein thrombosis involving the left femoral vein, left popliteal vein, left posterior tibial veins, and left peroneal veins.  *See table(s) above for measurements and observations. Electronically signed by Fabienne Bruns MD on 08/07/2020 at 5:10:42 PM.    Final     Labs:  CBC: Recent Labs    08/21/20 0116 08/21/20 0116 08/22/20 2353 08/22/20 6144 08/22/20 2204 08/23/20 0011 08/23/20 0335 08/23/20 1233  WBC 18.6*  --  17.7*  --   --  22.9*  --  16.5*  HGB 12.3*   < > 9.6*   < > 6.8* 5.9*  6.1* 7.1* 10.5*  HCT 36.9*   < > 29.8*   < > 20.0* 18.4*  18.0* 21.0* 30.7*  PLT 321  --  285  --   --  245  --  123*   < > = values in this interval not displayed.    COAGS: Recent Labs    08/20/20 0500 08/21/20 0116 08/22/20 0607 08/23/20 0011  INR 1.2 1.3* 1.2 2.1*    BMP: Recent Labs    08/20/20 0500 08/20/20 0500 08/21/20 0116 08/21/20 0116 08/22/20 0607 08/22/20 2204 08/23/20 0011 08/23/20 0335  NA 135   < > 134*   < > 130* 129* 136  133* 132*  K 4.3   < >  4.9   < > 5.0 5.5* 5.3*  5.1 5.2*  CL 98  --  99  --  94*  --  99  --   CO2 27  --  24  --  25  --  15*  --   GLUCOSE 113*  --  133*  --  125*  --  127*  --   BUN 22*  --  27*  --  31*  --  47*  --    CALCIUM 8.5*  --  8.5*  --  8.3*  --  6.9*  --   CREATININE 1.24  --  1.30*  --  1.16  --  3.02*  --   GFRNONAA >60  --  >60  --  >60  --  22*  --   GFRAA >60  --  >60  --  >60  --  26*  --    < > = values in this interval not displayed.    LIVER FUNCTION TESTS: Recent Labs    08/10/20 0612 08/11/20 0926 08/12/20 0650 08/23/20 0011  BILITOT 1.1 0.8 0.7 1.0  AST 60* 52* 45* 1,640*  ALT 62* 65* 69* 1,881*  ALKPHOS 98 104 109 78  PROT 6.4* 6.8 7.1 4.6*  ALBUMIN 2.2* 2.4* 2.5* 1.6*     Assessment and Plan:  Acute bilateral DVT on anticoagulation with Heparin gtt; complicated by development of left retroperitoneal hematoma requiring cessation of anticoagulation. Plan for image-guided IVCF placement in IR tentatively for today pending IR scheduling. Patient is NPO. Afebrile.  Risks and benefits discussed with the patient including, but not limited to bleeding, infection, contrast induced renal failure, filter fracture or migration which can lead to emergency surgery or even death, strut penetration with damage or irritation to adjacent structures and caval thrombosis. All of the patient's questions were answered, patient is agreeable to proceed. Consent obtained by patient's wife, Henry May, signed and in IR control room.  ADDENDUM: Patient was called to unit for procedure however was on tube feeds that were started after I saw patient. Due to risk of aspiration while laying flat with tube feeds on board, will plan for procedure tomorrow in IR. Please hold all tube feeds at midnight for this. Dr. Tonia Brooms made aware.   Thank you for this interesting consult.  I greatly enjoyed meeting Henry May and look forward to participating in their care.  A copy of this report was sent to the requesting provider on this date.  Electronically Signed: Elwin Mocha, PA-C 08/23/2020, 2:04 PM   I spent a total of 40 Minutes in face to face in clinical consultation, greater than 50% of  which was counseling/coordinating care for DVT/IVCF placement.

## 2020-08-23 NOTE — Progress Notes (Signed)
NAME:  Henry May, MRN:  [id number], DOB:  Jan 19, 1966, LOS: 17 ADMISSION DATE:  08/06/2020, CONSULTATION DATE:  08/22/20 REFERRING MD:  Toniann Fail, CHIEF COMPLAINT:  Dyspnea   Brief History   54 y/o M with no significant PMH who presented with increasing shortness of breath. He was diagnosed with Covid-19 on 8/12 and SOB progressively worsened.  History of present illness   Henry May 54 year old male with no significant past medical history, non-smoker,  who was diagnosed with Covid-19 on 8/16 along with his wife. He was scheduled to get covid 19 vaccine, but had not received it yet.    Admitted 8/22 and very tenuous on bipap and HFNC throughout hospitalization.   Transferred to 2W on 9/3.   Heparin infusion started 08/20/20  Was doing fairly well, was on HFNC 60%, 30L this am.  Labs fine this am. This afternoon FiO2  Was up to 100%.  Rapid response nurse called this evening due to worsening agitation, hypotension.  Had received oxycodone and xanax at around 630 pm.   I came to evaluate, patient was lethargic, arousable and answering questions with one word, increased work of breathing, pallor, cool and clammy.  BP intermittently quite low.  Responding to fluid boluses.   Oxygen saturation actually 100%.  ABG and basic labs pending.    Planned to transfer to the ICU, place art line for closer monitoring of BP.    When about to transfer, mental status changed, became unresponsive, found to be pulseless.   PEA lasted for about 4 minutes, 1 dose epi 1mg , ROSC achieved.  Hypotensive.   Intubated immediately post arrest.  Started on Levophed 64mcg/min through IO line.  Transferred to ICU. Awake and following commands  Remained Hypotensive, acidemic with high lactate, given bicarbonate boluses and started infusion. Placed arterial line, central line in groin due to emergent need for high dose pressors, fluids, and sedation.     Past Medical History  No past medical history on  file  Significant Hospital Events   8/22 admit to hospitalists 8/23 PCCM consult, ICU txfr  8/25 BiPAP and admitted to the ICU 9/3 transferred out of ICU 9/5 started Heparin  9/8 transferred back to ICU, Acute RP Bleed, Shock   Consults:  PCCM  Procedures:  Intubation 9/7 Central line 9/7 Arterial line 9/7  Significant Diagnostic Tests:  8/22 CXR>>Mild right lung infiltrates with mild to moderate severity left basilar infiltrate.  8/23 ultrasound LE>> acute DVT bilaterally  8/23 echocardiogram>> EF 40 to 45% with regional wall motion abnormality of LV.  Grade 1 diastolic dysfunction.  Severe hypokinesis of LV basal mid inferior wall.  The RV size is mildly enlarged and RA size was moderately dilated   Micro Data:  8/22 Covid-19>>positive 8/22 BCx2>> neg 8/25 MRSA PCR >> negative   Antimicrobials:  Remdesevir 8/22 >> 8/26 baricitinib 8/25 >> Azithromycin 8/22 >> 8/23 Ceftriaxone 8/22 >> 8/23 Vancomycin 8/23>> 8/25 Cefepime 8/23>> 8/29  Interim history/subjective:   Critically ill. Intubated on life support   Objective   Blood pressure 111/74, pulse 70, temperature (!) 95.5 F (35.3 C), temperature source Bladder, resp. rate (!) 29, height 6' (1.829 m), weight 89.6 kg, SpO2 93 %.    Vent Mode: PRVC FiO2 (%):  [70 %-100 %] 70 % Set Rate:  [24 bmp-30 bmp] 30 bmp Vt Set:  [500 mL-620 mL] 540 mL PEEP:  [8 cmH20-12 cmH20] 8 cmH20 Plateau Pressure:  [29 cmH20-33 cmH20] 33 cmH20   Intake/Output Summary (Last 24 hours)  at 08/23/2020 1042 Last data filed at 08/23/2020 0700 Gross per 24 hour  Intake 6134.77 ml  Output 560 ml  Net 5574.77 ml   Filed Weights   08/17/20 0500 08/19/20 0616 08/20/20 0405  Weight: 90.2 kg 88.6 kg 89.6 kg    General appearance: 54 y.o., male, intubated on life support  HENT: NCAT ETT in place  Neck: Trachea midline; Lungs: BL vented breaths  CV: tachy regular , S1, S2, no MRGs  Abdomen: distended Extremities: No peripheral edema,  radial and DP pulses present bilaterally  Skin: Normal temperature, turgor and texture; no rash Neuro: sedated on mechanical support     Resolved Hospital Problem list     Assessment & Plan:   Hemorrhagic shock, Hypothermia, Acidosis  Acute retroperitoneal bleed Acute blood loss anemia Lactic acidosis secondary to above S/p Cardiac Arrest, PEA ~31mins downtime  Plan: Stat repeat CBC Patient status post 2 transfusions of PRBCs, 2 units FFP. If additional bleeding will consult interventional radiology. Patient has bilateral DVT. Likely need IVC filter if unable to anticoagulate.  Acute hypoxemic respiratory failure secondary to COVID-19 pneumonia -Intubated on mechanical life support Continue adult mechanical ventilator protocol Steroid taper. Completed treatments for COVID-19.  Heart failure with reduced ejection fraction Echo 8/23 show EF 40 to 45% with regional wall motion abnormalities.   Diuresis to maintain euvolemia as needed.  Elevated creatinine/AKI 2/2 covid now 2/2 s/p cardiac arrest.  Foley insertion to monitor urine output Follow BMP. High risk for need of renal replacement therapy.  Acute  B LE  DVT, dx 8/23 Doppler ultrasound shows acute DVT of LE bilaterally.  Anticoagulation held due to hemorrhagic shock. Consult interventional radiology for IVC filter  QT prolongation  Avoid QT prolonging agents  Sinus tachycardia Related to shock and hemorrhage.  Essential hypertension Holding home dose amlodipine   Best practice:  Diet: Regular Pain/Anxiety/Delirium protocol (if indicated): Precedex, prn fentanyl and versed.  VAP protocol (if indicated): N/A DVT prophylaxis: HELD due to possible bleeding.  GI prophylaxis: Protonix Glucose control: Sliding scale Mobility: Bedrest Code Status: Full Family Communication: team will update family  Disposition: ICU   This patient is critically ill with multiple organ system failure; which, requires  frequent high complexity decision making, assessment, support, evaluation, and titration of therapies. This was completed through the application of advanced monitoring technologies and extensive interpretation of multiple databases. During this encounter critical care time was devoted to patient care services described in this note for 36 minutes.  Josephine Igo, DO [facility] 08/23/2020 10:43 AM

## 2020-08-23 NOTE — Progress Notes (Signed)
OT Cancellation Note  Patient Details Name: Henry May MRN: [id number] DOB: 12-01-1966   Cancelled Treatment:    Reason Eval/Treat Not Completed: Patient not medically ready;Medical issues which prohibited therapy. PEA 9/7. Transfer to ICU.  Thornell Mule, OT/L   Acute OT Clinical Specialist Acute Rehabilitation Services Pager [phone] Office [phone]  08/23/2020, 7:14 AM

## 2020-08-23 NOTE — Progress Notes (Addendum)
NAME:  Henry May, MRN:  [id number], DOB:  [birth year], LOS: 17 ADMISSION DATE:  08/06/2020, CONSULTATION DATE:  08/22/20 REFERRING MD:  Toniann Fail, CHIEF COMPLAINT:  Dyspnea   Brief History   54 y/o M with no significant PMH who presented with increasing shortness of breath. He was diagnosed with Covid-19 on 8/12 and SOB progressively worsened.  History of present illness   Henry May 54 year old male with no significant past medical history, non-smoker,  who was diagnosed with Covid-19 on 8/16 along with his wife. He was scheduled to get covid 19 vaccine, but had not received it yet.    Admitted 8/22 and very tenuous on bipap and HFNC throughout hospitalization.   Transferred to 2W on 9/3.   Heparin infusion started 08/20/20  Was doing fairly well, was on HFNC 60%, 30L this am.  Labs fine this am. This afternoon FiO2  Was up to 100%.  Rapid response nurse called this evening due to worsening agitation, hypotension.  Had received oxycodone and xanax at around 630 pm.   I came to evaluate, patient was lethargic, arousable and answering questions with one word, increased work of breathing, pallor, cool and clammy.  BP intermittently quite low.  Responding to fluid boluses.   Oxygen saturation actually 100%.  ABG and basic labs pending.    Planned to transfer to the ICU, place art line for closer monitoring of BP.    When about to transfer, mental status changed, became unresponsive, found to be pulseless.   PEA lasted for about 4 minutes, 1 dose epi 1mg , ROSC achieved.  Hypotensive.   Intubated immediately post arrest.  Started on Levophed 31mcg/min through IO line.  Transferred to ICU. Awake and following commands  Remained Hypotensive, acidemic with high lactate, given bicarbonate boluses and started infusion. Placed arterial line, central line in groin due to emergent need for high dose pressors, fluids, and sedation.     Past Medical History  No past medical history on  file  Significant Hospital Events   8/22 admit to hospitalists 8/23 PCCM consult, ICU txfr  8/25 BiPAP and admitted to the ICU 9/3 transferred out of ICU 9/5 started Heparin  9/8 transferred back to ICU  Consults:  PCCM  Procedures:  Intubation 9/7 Central line 9/7 Arterial line 9/7  Significant Diagnostic Tests:  8/22 CXR>>Mild right lung infiltrates with mild to moderate severity left basilar infiltrate.  8/23 ultrasound LE>> acute DVT bilaterally  8/23 echocardiogram>> EF 40 to 45% with regional wall motion abnormality of LV.  Grade 1 diastolic dysfunction.  Severe hypokinesis of LV basal mid inferior wall.  The RV size is mildly enlarged and RA size was moderately dilated   Micro Data:  8/22 Covid-19>>positive 8/22 BCx2>> neg 8/25 MRSA PCR >> negative   Antimicrobials:  Remdesevir 8/22 >> 8/26 baricitinib 8/25 >> Azithromycin 8/22 >> 8/23 Ceftriaxone 8/22 >> 8/23 Vancomycin 8/23>> 8/25 Cefepime 8/23>> 8/29  Interim history/subjective:  No acute event overnight. Remains stable on HFNC and NRB. No requirement for bipap.   Objective   Blood pressure (!) 90/52, pulse 95, temperature 97.8 F (36.6 C), temperature source Axillary, resp. rate (!) 32, height 6' (1.829 m), weight 89.6 kg, SpO2 92 %.    Vent Mode: PRVC FiO2 (%):  [60 %-100 %] 70 % Set Rate:  [24 bmp] 24 bmp Vt Set:  [500 mL-620 mL] 620 mL PEEP:  [8 cmH20-12 cmH20] 8 cmH20 Plateau Pressure:  [29 cmH20] 29 cmH20   Intake/Output Summary (Last 24 hours)  at 08/23/2020 0118 Last data filed at 08/23/2020 0000 Gross per 24 hour  Intake 3060.06 ml  Output 300 ml  Net 2760.06 ml   Filed Weights   08/17/20 0500 08/19/20 0616 08/20/20 0405  Weight: 90.2 kg 88.6 kg 89.6 kg   General:  Intubated, HEENT: No scleral icterus, perrl Neuro: occasionally moving extremities, occasionally agitated, following commands.  CV: rrr, no murmur PULM: reduced bs bilaterally.  GI: soft, bs active, no obvious tenderness, no  obvious distention.  Extremities: cool +pallor, no LE edema   Skin: no rashes   Bedside echo: unable to find satisfactory cardiac windows.  B lung ultrasound: lung sliding present B  Resolved Hospital Problem list     Assessment & Plan:  Hypotension, metabolic acidosis, lactic acidosis:  Possible hemorrhage, given anemia.  Intraabdominal vs RP hemorrhage? No GI bleeding.  Had been on treatment dose lovenox  (changed to heparin infusion on 9/5) for B DVT for almost entire duration of hospitalization.   Also possible component of hypovolemia (has been diuresed, net negative 10L during hospitalization).   Also consider PE or sepsis.  EKG no signs of MI.   Given anemia, hemorrhagic shock most likely.  Checking CT ch/abd/pelvis to attempt to find cause.  No contrast due to aki.  Trasfuse 2 u PRBC.  INR pending, fibrinogen pending.  Heparin stopped.   Cont fluid boluses, unfortunately unable to visualize heart on bedside echo.  Consider converting central line to IJ line for CVP monitoring.  Discussed stat echo with cardiology but will check CT and resuscsitate with blood first.   Bicarb infusion.  PE less likely given has been anticoagulated, and anemia offers alt explaination.  Start abtx for possible sepsis, though less likely.  Procal pending.    Acute hypoxemic respiratory failure secondary to COVID-19 pneumonia - Now intubated for airway protection, hyperventilation to help with compensation for metabolic acidosis.  Had been oxygenating fairly, was stable.   - Maintain O2 saturations for goal SpO2 > 85% - Can discontinue baricitinib (has been on for 16 days?) - Continue solumedrol taper   Heart failure with reduced ejection fraction Echo 8/23 show EF 40 to 45% with regional wall motion abnormalities.   Repeat echo pending  Elevated creatinine/AKI 2/2 covid now 2/2 s/p cardiac arrest. Foley catheter placed to eval perfusion.  Monitor UOP.   Acute  B LE  DVT, dx 8/23 Doppler  ultrasound shows acute DVT of LE bilaterally.  Lovenox held, heparin held due to anemia, likely bleeding.  Will need IVC filter in AM.   QT prolongation  - tele monitoring, avoid QTc prolonging agents   Sinus tachycardia Worsening in past few days. Thought possibly due to PE, of note, hb has actually been trending down daily. (14-->12.3 --> 9.6 --> 5.9)   Essential hypertension -Norvasc 5 mg daily - HELD  Stomach distention: air filled stomach on CXR. prob 2/2 prolonged bipap, consider ileus or sbo.  Check status on CT abd.  Gastric tube to intermittent suction.   Best practice:  Diet: Regular Pain/Anxiety/Delirium protocol (if indicated): Precedex, prn fentanyl and versed.  VAP protocol (if indicated): N/A DVT prophylaxis: HELD due to possible bleeding.  GI prophylaxis: Protonix Glucose control: Sliding scale Mobility: Bedrest Code Status: Full Family Communication: Updated wife and daughter in conference room Disposition: ICU  Total critical care time 60 minutes.

## 2020-08-23 NOTE — TOC Progression Note (Signed)
Transition of Care [facility]) - Progression Note    Patient Details  Name: Henry May MRN: [id number] Date of Birth: October 14, 1966  [facility]) CM/SW Contact  Lockie Pares, RN Phone Number: 08/23/2020, 11:24 AM  Clinical Narrative:    Patient arrested PEA after Rapid Response saw him, worsening hypotension, oxygenation.  Now intubated., on pressors in ICU.  Will continue to follow for needs.     Expected Discharge Plan: Home w Home Health Services Barriers to Discharge: Continued Medical Work up  Expected Discharge Plan and Services Expected Discharge Plan: Home w Home Health Services                                               Social Determinants of Health (SDOH) Interventions    Readmission Risk Interventions No flowsheet data found.

## 2020-08-23 NOTE — Progress Notes (Signed)
Nutrition Follow-up  DOCUMENTATION CODES:   Not applicable  INTERVENTION:   Initiate tube feeding via OG tube: Vital 1.5 at 70 ml/h (1680 ml per day) Prosource TF 90 ml TID  Provides 2760 kcal, 179 gm protein, 1284 ml free water daily  NUTRITION DIAGNOSIS:   Increased nutrient needs related to catabolic illness (COVID-19) as evidenced by estimated needs.  Ongoing   GOAL:   Patient will meet greater than or equal to 90% of their needs  Progressing  MONITOR:   Vent status, Labs, TF tolerance, I & O's  REASON FOR ASSESSMENT:   Ventilator (okay to begin TF per discussion in rounds)    ASSESSMENT:   54 yo male with no significant PMH admitted with increasing SOB, COVID positive on 8/12.  RD discussed patient in ICU rounds today. Transferred back to ICU s/p PEA arrest x 4 minutes yesterday evening.  Okay to begin TF per discussion in rounds. Patient may require renal replacement therapy.   Per chart review, patient's family was bringing in his meals, he was not eating meals from the hospital kitchen.    Patient is currently intubated on ventilator support MV: 20.4 L/min Temp (24hrs), Avg:95.4 F (35.2 C), Min:93.9 F (34.4 C), Max:97.8 F (36.6 C)   Labs reviewed. K 5.3, BUN 47, Creat 3.02, ionized calcium 0.92, Hgb 5.9 CBG: 156-151  Medications reviewed and include colace, novolog, solu-medrol, MVI with minerals, miralax, vasopressin. Received levophed overnight, but stopped this morning.   Weight 89.6 kg today, 96.3 kg on admission 8/25. UOP 560 ml x 24 hours.   Diet Order:   Diet Order            Diet renal with fluid restriction Fluid restriction: 2000 mL Fluid; Room service appropriate? Yes; Fluid consistency: Thin  Diet effective now                 EDUCATION NEEDS:   No education needs have been identified at this time  Skin:  Skin Assessment: Reviewed RN Assessment  Last BM:  9/5 type 4  Height:   Ht Readings from Last 1 Encounters:   08/23/20 6' (1.829 m)    Weight:   Wt Readings from Last 1 Encounters:  08/20/20 89.6 kg    Ideal Body Weight:  80.9 kg  BMI:  Body mass index is 26.79 kg/m.  Estimated Nutritional Needs:   Kcal:  2600-3000  Protein:  175-190 gm  Fluid:  >/= 2.5    Gabriel Rainwater, RD, LDN, CNSC Please refer to Amion for contact information.

## 2020-08-23 NOTE — Progress Notes (Signed)
CRITICAL VALUE ALERT  Critical Value:  hbg-5.9  Date & Time Notied:  08/23/2020 0028  Provider Notified: Dr. Marcos Eke  Orders Received/Actions taken: T and C, transfuse 2 units.

## 2020-08-23 NOTE — Progress Notes (Signed)
  Echocardiogram 2D Echocardiogram was attempted but patient had no cardiac ultrasound windows.   [name] 08/23/2020, 1:30 PM

## 2020-08-24 ENCOUNTER — Inpatient Hospital Stay (HOSPITAL_COMMUNITY): Payer: BC Managed Care – PPO

## 2020-08-24 DIAGNOSIS — I82403 Acute embolism and thrombosis of unspecified deep veins of lower extremity, bilateral: Secondary | ICD-10-CM

## 2020-08-24 HISTORY — PX: IR IVC FILTER PLMT / S&I /IMG GUID/MOD SED: IMG701

## 2020-08-24 LAB — TYPE AND SCREEN
ABO/RH(D): O POS
Antibody Screen: NEGATIVE
Unit division: 0
Unit division: 0
Unit division: 0
Unit division: 0

## 2020-08-24 LAB — BASIC METABOLIC PANEL
Anion gap: 13 (ref 5–15)
BUN: 73 mg/dL — ABNORMAL HIGH (ref 6–20)
CO2: 21 mmol/L — ABNORMAL LOW (ref 22–32)
Calcium: 7.4 mg/dL — ABNORMAL LOW (ref 8.9–10.3)
Chloride: 103 mmol/L (ref 98–111)
Creatinine, Ser: 2.16 mg/dL — ABNORMAL HIGH (ref 0.61–1.24)
GFR calc Af Amer: 38 mL/min — ABNORMAL LOW (ref >60)
GFR calc non Af Amer: 33 mL/min — ABNORMAL LOW (ref >60)
Glucose, Bld: 132 mg/dL — ABNORMAL HIGH (ref 70–99)
Potassium: 4.2 mmol/L (ref 3.5–5.1)
Sodium: 137 mmol/L (ref 135–145)

## 2020-08-24 LAB — PROTIME-INR
INR: 1.6 — ABNORMAL HIGH (ref 0.8–1.2)
Prothrombin Time: 18.9 seconds — ABNORMAL HIGH (ref 11.4–15.2)

## 2020-08-24 LAB — BPAM FFP
Blood Product Expiration Date: 202109122359
Blood Product Expiration Date: 202109122359
ISSUE DATE / TIME: 202109080338
ISSUE DATE / TIME: 202109080535
Unit Type and Rh: 6200
Unit Type and Rh: 6200

## 2020-08-24 LAB — CBC
HCT: 29.5 % — ABNORMAL LOW (ref 39.0–52.0)
Hemoglobin: 10.3 g/dL — ABNORMAL LOW (ref 13.0–17.0)
MCH: 29.3 pg (ref 26.0–34.0)
MCHC: 34.9 g/dL (ref 30.0–36.0)
MCV: 83.8 fL (ref 80.0–100.0)
Platelets: UNDETERMINED 10*3/uL (ref 150–400)
RBC: 3.52 MIL/uL — ABNORMAL LOW (ref 4.22–5.81)
RDW: 17.8 % — ABNORMAL HIGH (ref 11.5–15.5)
WBC: 20.1 10*3/uL — ABNORMAL HIGH (ref 4.0–10.5)
nRBC: 1 % — ABNORMAL HIGH (ref 0.0–0.2)

## 2020-08-24 LAB — POCT I-STAT 7, (LYTES, BLD GAS, ICA,H+H)
Acid-Base Excess: 0 mmol/L (ref 0.0–2.0)
Bicarbonate: 23.9 mmol/L (ref 20.0–28.0)
Calcium, Ion: 1.05 mmol/L — ABNORMAL LOW (ref 1.15–1.40)
HCT: 30 % — ABNORMAL LOW (ref 39.0–52.0)
Hemoglobin: 10.2 g/dL — ABNORMAL LOW (ref 13.0–17.0)
O2 Saturation: 96 %
Patient temperature: 36.9
Potassium: 4.2 mmol/L (ref 3.5–5.1)
Sodium: 139 mmol/L (ref 135–145)
TCO2: 25 mmol/L (ref 22–32)
pCO2 arterial: 34.6 mmHg (ref 32.0–48.0)
pH, Arterial: 7.448 (ref 7.350–7.450)
pO2, Arterial: 75 mmHg — ABNORMAL LOW (ref 83.0–108.0)

## 2020-08-24 LAB — GLUCOSE, CAPILLARY
Glucose-Capillary: 101 mg/dL — ABNORMAL HIGH (ref 70–99)
Glucose-Capillary: 103 mg/dL — ABNORMAL HIGH (ref 70–99)
Glucose-Capillary: 113 mg/dL — ABNORMAL HIGH (ref 70–99)
Glucose-Capillary: 115 mg/dL — ABNORMAL HIGH (ref 70–99)
Glucose-Capillary: 131 mg/dL — ABNORMAL HIGH (ref 70–99)
Glucose-Capillary: 140 mg/dL — ABNORMAL HIGH (ref 70–99)

## 2020-08-24 LAB — BPAM RBC
Blood Product Expiration Date: 202110012359
Blood Product Expiration Date: 202110032359
Blood Product Expiration Date: 202110042359
Blood Product Expiration Date: 202110042359
ISSUE DATE / TIME: 202109080139
ISSUE DATE / TIME: 202109080338
ISSUE DATE / TIME: 202109080706
ISSUE DATE / TIME: 202109080706
Unit Type and Rh: 5100
Unit Type and Rh: 5100
Unit Type and Rh: 5100
Unit Type and Rh: 5100

## 2020-08-24 LAB — MAGNESIUM: Magnesium: 2.8 mg/dL — ABNORMAL HIGH (ref 1.7–2.4)

## 2020-08-24 LAB — PREPARE FRESH FROZEN PLASMA: Unit division: 0

## 2020-08-24 LAB — PROCALCITONIN: Procalcitonin: 2.13 ng/mL

## 2020-08-24 LAB — VANCOMYCIN, RANDOM: Vancomycin Rm: 10

## 2020-08-24 MED ORDER — VANCOMYCIN HCL 1750 MG/350ML IV SOLN
1750.0000 mg | Freq: Once | INTRAVENOUS | Status: DC
  Filled 2020-08-24: qty 350

## 2020-08-24 MED ORDER — SENNA 8.6 MG PO TABS
1.0000 | ORAL_TABLET | Freq: Two times a day (BID) | ORAL | Status: DC
  Administered 2020-08-24: 8.6 mg via ORAL
  Filled 2020-08-24: qty 1

## 2020-08-24 MED ORDER — ADULT MULTIVITAMIN LIQUID CH
15.0000 mL | Freq: Every day | ORAL | Status: DC
  Administered 2020-08-25: 15 mL
  Filled 2020-08-24: qty 15

## 2020-08-24 MED ORDER — WHITE PETROLATUM EX OINT
TOPICAL_OINTMENT | CUTANEOUS | Status: AC
  Administered 2020-08-24: 1 via TOPICAL
  Filled 2020-08-24: qty 28.35

## 2020-08-24 MED ORDER — ORAL CARE MOUTH RINSE
15.0000 mL | Freq: Two times a day (BID) | OROMUCOSAL | Status: DC
  Administered 2020-08-24 – 2020-09-04 (×20): 15 mL via OROMUCOSAL

## 2020-08-24 MED ORDER — WHITE PETROLATUM EX OINT: 1.0000 "application " | TOPICAL_OINTMENT | CUTANEOUS | Status: DC | PRN

## 2020-08-24 MED ORDER — LIDOCAINE HCL 1 % IJ SOLN
INTRAMUSCULAR | Status: AC
  Filled 2020-08-24: qty 20

## 2020-08-24 MED ORDER — MIDAZOLAM HCL 2 MG/2ML IJ SOLN
INTRAMUSCULAR | Status: AC
  Filled 2020-08-24: qty 2

## 2020-08-24 MED ORDER — SENNA 8.6 MG PO TABS
1.0000 | ORAL_TABLET | Freq: Two times a day (BID) | ORAL | Status: DC
  Administered 2020-08-24 – 2020-08-25 (×2): 8.6 mg
  Filled 2020-08-24 (×2): qty 1

## 2020-08-24 MED ORDER — FENTANYL CITRATE (PF) 100 MCG/2ML IJ SOLN
INTRAMUSCULAR | Status: AC
Start: 2020-08-24 — End: 2020-08-25
  Filled 2020-08-24: qty 2

## 2020-08-24 MED ORDER — FENTANYL CITRATE (PF) 100 MCG/2ML IJ SOLN
INTRAMUSCULAR | Status: AC | PRN
  Administered 2020-08-24: 50 ug via INTRAVENOUS

## 2020-08-24 MED ORDER — SODIUM CHLORIDE 0.9 % IV SOLN: 2.0000 g | Freq: Two times a day (BID) | INTRAVENOUS | Status: DC

## 2020-08-24 NOTE — TOC Progression Note (Addendum)
Transition of Care [facility]) - Progression Note    Patient Details  Name: Keidan Aumiller MRN: [id number] Date of Birth: 1966-05-14  Transition of Care [facility]) CM/SW Contact  Lockie Pares, RN Phone Number: 08/24/2020, 10:09 AM  Clinical Narrative:    PEA arrested  Last evening, CPR done 4 minutes, ROSC achieved pulse returned, most likely due to hypoxia.  Patient now intubated requiring high level of oxygen EF 40-45%  Patient will need financial counseling, medicaid application by family.  Currently uninsured. CM will follow for needs. Will speak to family today.   Have already placed CHW for follow up on patient instructions.    Expected Discharge Plan: Home w Home Health Services Barriers to Discharge: Continued Medical Work up  Expected Discharge Plan and Services Expected Discharge Plan: Home w Home Health Services                                               Social Determinants of Health (SDOH) Interventions    Readmission Risk Interventions No flowsheet data found.

## 2020-08-24 NOTE — Procedures (Signed)
Pre procedural Dx: DVT with contraindication to anticoagulation Post Procedural Dx: Same  Successful placement of an infrarenal IVC filter via the R IJ vein.  EBL: None Complications: None immediate  Katherina Right, MD Pager #: [phone]

## 2020-08-24 NOTE — Progress Notes (Signed)
Physical Therapy Treatment Patient Details Name: Henry May MRN: [id number] DOB: [birth year] Today's Date: 08/24/2020    History of Present Illness 54 yo unvaccinated male admitted with hypoxia and WOB due to Covid 19 on 8/22 with initial diagnosis 8/12.  PEA arrest in hospital on 9/7 resulting in intubation.  Pt VDRF and going for IVC filter 9/9.     PT Comments    Pt admitted with above diagnosis. Pt was able to perform exercises with bil UE and left LE all planes. Limited exercise on right LE due to right femoral line. Pt going for IVC filter this pm and hopeful that PT can progress pt further tomorrow.  Will follow acutely.  Pt currently with functional limitations due to balance and endurance deficits. Pt will benefit from skilled PT to increase their independence and safety with mobility to allow discharge to the venue listed below.     Follow Up Recommendations  Supervision/Assistance - 24 hour;CIR;Other (comment)     Equipment Recommendations  Rolling walker with 5" wheels    Recommendations for Other Services OT consult     Precautions / Restrictions Precautions Precautions: Other (comment) Precaution Comments: watch sats, right femoral central/A line Restrictions Weight Bearing Restrictions: No    Mobility  Bed Mobility               General bed mobility comments: Bed level exercises per nurse as pt with right femoral line  Transfers                    Ambulation/Gait                 Stairs             Wheelchair Mobility    Modified Rankin (Stroke Patients Only)       Balance                                            Cognition Arousal/Alertness: Awake/alert Behavior During Therapy: WFL for tasks assessed/performed Overall Cognitive Status:  (NT formally) Area of Impairment: Safety/judgement                 Orientation Level: Time       Safety/Judgement: Decreased awareness of  safety;Decreased awareness of deficits   Problem Solving: Slow processing General Comments: follows direction well, slowed processing with english, used communication board and wife translating as pt writing in Spanish      Exercises General Exercises - Upper Extremity Shoulder Flexion: AROM;Both;15 reps;Supine Shoulder Extension: AROM;Both;10 reps;Supine Elbow Flexion: AROM;Both;10 reps;Supine Elbow Extension: AROM;Both;10 reps;Supine Wrist Flexion: AROM;Both;5 reps;Supine Wrist Extension: AROM;Both;5 reps;Supine Digit Composite Flexion: AROM;Both;5 reps;Supine General Exercises - Lower Extremity Ankle Circles/Pumps: AROM;Both;10 reps;Supine Quad Sets: AROM;Both;10 reps;Supine Heel Slides: AROM;Left;10 reps;Supine Hip ABduction/ADduction: AROM;Left;10 reps;Supine Straight Leg Raises: AROM;Left;10 reps;Supine    General Comments General comments (skin integrity, edema, etc.): 82 bpm, 93%, 100/58, 39 RR.        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 Progress towards PT goals: Not progressing toward goals - comment (Bed level due to medical issues)    Frequency    Min 3X/week      PT Plan Current plan remains appropriate    Co-evaluation              AM-PAC PT "6 Clicks" Mobility   Outcome Measure  Help needed turning from your back to your side while in a flat bed without using bedrails?: Total Help needed moving from lying on your back to sitting on the side of a flat bed without using bedrails?: Total Help needed moving to and  from a bed to a chair (including a wheelchair)?: Total Help needed standing up from a chair using your arms (e.g., wheelchair or bedside chair)?: Total Help needed to walk in hospital room?: Total Help needed climbing 3-5 steps with a railing? : Total 6 Click Score: 6    End of Session Equipment Utilized During Treatment: Oxygen;Gait belt;Other (comment) (VEnt settings 45% FiO2 and PEEP 5. ) Activity Tolerance: Patient tolerated treatment well Patient left: in bed;with call bell/phone within reach;with bed alarm set;with family/visitor present Nurse Communication: Mobility status PT Visit Diagnosis: Other abnormalities of gait and mobility (R26.89);Muscle weakness (generalized) (M62.81);Difficulty in walking, not elsewhere classified (R26.2)     Time: 2952-8413 PT Time Calculation (min) (ACUTE ONLY): 21 min  Charges:  $Therapeutic Exercise: 8-22 mins                     Henry May,PT Acute Rehabilitation Services Pager:  [phone]  Office:  [phone]     Henry May 08/24/2020, 12:50 PM

## 2020-08-24 NOTE — Progress Notes (Signed)
NAME:  Henry May, MRN:  [id number], DOB:  1966-10-31, LOS: 18 ADMISSION DATE:  08/06/2020, CONSULTATION DATE:  08/22/20 REFERRING MD:  Toniann Fail, CHIEF COMPLAINT:  Dyspnea   Brief History   54 y/o M with no significant PMH who presented with increasing shortness of breath. He was diagnosed with Covid-19 on 8/12 and SOB progressively worsened.  History of present illness   [name] 54 year old male with no significant past medical history, non-smoker,  who was diagnosed with Covid-19 on 8/16 along with his wife. He was scheduled to get covid 19 vaccine, but had not received it yet.    Admitted 8/22 and very tenuous on bipap and HFNC throughout hospitalization.   Transferred to 2W on 9/3.   Heparin infusion started 08/20/20  Was doing fairly well, was on HFNC 60%, 30L this am.  Labs fine this am. This afternoon FiO2  Was up to 100%.  Rapid response nurse called this evening due to worsening agitation, hypotension.  Had received oxycodone and xanax at around 630 pm.   I came to evaluate, patient was lethargic, arousable and answering questions with one word, increased work of breathing, pallor, cool and clammy.  BP intermittently quite low.  Responding to fluid boluses.   Oxygen saturation actually 100%.  ABG and basic labs pending.    Planned to transfer to the ICU, place art line for closer monitoring of BP.    When about to transfer, mental status changed, became unresponsive, found to be pulseless.   PEA lasted for about 4 minutes, 1 dose epi 1mg , ROSC achieved.  Hypotensive.   Intubated immediately post arrest.  Started on Levophed 83mcg/min through IO line.  Transferred to ICU. Awake and following commands  Remained Hypotensive, acidemic with high lactate, given bicarbonate boluses and started infusion. Placed arterial line, central line in groin due to emergent need for high dose pressors, fluids, and sedation.     Past Medical History  No past medical history on  file  Significant Hospital Events   8/22 admit to hospitalists 8/23 PCCM consult, ICU txfr  8/25 BiPAP and admitted to the ICU 9/3 transferred out of ICU 9/5 started Heparin  9/8 transferred back to ICU, Acute RP Bleed, Shock   Consults:  PCCM  Procedures:  Intubation 9/7 Central line 9/7 Arterial line 9/7  Significant Diagnostic Tests:  8/22 CXR>>Mild right lung infiltrates with mild to moderate severity left basilar infiltrate.  8/23 ultrasound LE>> acute DVT bilaterally  8/23 echocardiogram>> EF 40 to 45% with regional wall motion abnormality of LV.  Grade 1 diastolic dysfunction.  Severe hypokinesis of LV basal mid inferior wall.  The RV size is mildly enlarged and RA size was moderately dilated   Micro Data:  8/22 Covid-19>>positive 8/22 BCx2>> neg 8/25 MRSA PCR >> negative   Antimicrobials:  Remdesevir 8/22 >> 8/26 baricitinib 8/25 >> Azithromycin 8/22 >> 8/23 Ceftriaxone 8/22 >> 8/23 Vancomycin 8/23>> 8/25 Cefepime 8/23>> 8/29  Interim history/subjective:   Critically ill. Remains intubated on mechanical life support. H&H remains stable. Going for IVC filter today.   Objective   Blood pressure 100/64, pulse 69, temperature 99 F (37.2 C), resp. rate (!) 34, height 6' (1.829 m), weight 94.7 kg, SpO2 98 %.    Vent Mode: PRVC FiO2 (%):  [60 %-70 %] 60 % Set Rate:  [30 bmp] 30 bmp Vt Set:  [540 mL] 540 mL PEEP:  [8 cmH20] 8 cmH20 Plateau Pressure:  [29 cmH20-32 cmH20] 32 cmH20   Intake/Output Summary (  Last 24 hours) at 08/24/2020 6387 Last data filed at 08/24/2020 0747 Gross per 24 hour  Intake 2144.85 ml  Output 2235 ml  Net -90.15 ml   Filed Weights   08/20/20 0405 08/23/20 0500 08/24/20 0400  Weight: 89.6 kg 94.8 kg 94.7 kg   General appearance: 54 y.o., male, NAD, on mechanical ventilation  Eyes:  PERRLA, tracking appropriately HENT: NCAT; ett in place  Neck: Trachea midline; Lungs: BL vented breaths  CV: RRR, S1, S2 Abdomen: Soft, non-tender;  non-distended Extremities: No peripheral edema, radial and DP pulses present bilaterally  Neuro: Alert, follows commands   Resolved Hospital Problem list     Assessment & Plan:   Hemorrhagic shock, Hypothermia, Acidosis  Acute retroperitoneal bleed Acute blood loss anemia Lactic acidosis secondary to above S/p Cardiac Arrest, PEA ~48mins downtime  Plan: Hgb stable  Continue to observe  If rebleeds consider cta and IR for embolization. At this time appears to have stablized  Mental status much improved, following commands   Leukocytosis  Abx stopped. No sign of infection at this time Observe for any signs of infection or fevers   Acute hypoxemic respiratory failure secondary to COVID-19 pneumonia Reintubated s/p PEA arrest -adult MV protocol  - wean Fio2 and peep as tolerated  - steroid tapering  - sedation with precedex - attempt sbt today, consideration for extubation soon   Heart failure with reduced ejection fraction Echo 8/23 show EF 40 to 45% with regional wall motion abnormalities.   - diuresis to maintain euvolemia   Elevated creatinine/AKI 2/2 covid now 2/2 s/p cardiac arrest.  - follow UOP and BMP - at risk for need of RRT   Acute  B LE  DVT, dx 8/23 Doppler ultrasound shows acute DVT of LE bilaterally.  Anticoagulation held due to hemorrhagic shock. - IVC filter planned for today   QT prolongation  - avoid qt prolonging drugs   Sinus tachycardia - related to bleeding, improved   Essential hypertension Holding amlodipine    Best practice:  Diet: Regular Pain/Anxiety/Delirium protocol (if indicated): Precedex, prn fentanyl and versed.  VAP protocol (if indicated): N/A DVT prophylaxis:  GI prophylaxis: Protonix Glucose control: Sliding scale Mobility: Bedrest Code Status: Full Family Communication: wife updated at bedside yesterday afternoon  Disposition: ICU   This patient is critically ill with multiple organ system failure; which,  requires frequent high complexity decision making, assessment, support, evaluation, and titration of therapies. This was completed through the application of advanced monitoring technologies and extensive interpretation of multiple databases. During this encounter critical care time was devoted to patient care services described in this note for 32 minutes.  Josephine Igo, DO [facility] 08/24/2020 8:22 AM

## 2020-08-24 NOTE — Progress Notes (Signed)
Pharmacy Antibiotic Note  Henry May is a 54 y.o. male admitted on 08/06/2020 with sepsis.  Pharmacy has been consulted for Vancomycin and Cefepime dosing. Patient also started on flagyl. Patient is s/p PEA on 9/7. Patient's renal function is not stable at this time but continues to show improvement. Current Scr 2.16 (3.02 on 9/8). Random vancomycin level this am is 10. Last vancomycin dose was started at 0700 on 9/8. Patient remains afebrile.   Plan: Increase Cefepime 2gm IV from Q24 to Q12 with improved renal function Vancomycin 1750mg  IV x1. F/u renal function for further Vanc dosing.  Random vancomycin level ordered with morning labs on 9/10 for monitoring purposes Will f/u renal function, micro data, and pt's clinical condition    Height: 6' (182.9 cm) Weight: 94.7 kg (208 lb 12.4 oz) IBW/kg (Calculated) : 77.6  Temp (24hrs), Avg:97.2 F (36.2 C), Min:95.5 F (35.3 C), Max:99 F (37.2 C)  Recent Labs  Lab 08/21/20 0116 08/21/20 0116 08/22/20 0607 08/22/20 2102 08/23/20 0011 08/23/20 1233 08/23/20 1858 08/24/20 0250  WBC 18.6*   < > 17.7*  --  22.9* 16.5* 17.1* 20.1*  CREATININE 1.30*  --  1.16  --  3.02*  --  2.60* 2.16*  LATICACIDVEN  --   --   --  >11.0* >11.0*  --  2.5*  --   VANCORANDOM  --   --   --   --   --   --   --  10   < > = values in this interval not displayed.    Estimated Creatinine Clearance: 45.6 mL/min (A) (by C-G formula based on SCr of 2.16 mg/dL (H)).    No Known Allergies  Antimicrobials this admission: 9/8 Vanc >> 9/8 Cefepime >> 9/8 Flagyl >> 8/23 Azith/Rocephin x 1 8/23 Vanc >> 8/25 8/23 Cefepime >> 8/29 8/23 Remdesivir >>8/27 8/25 baricitinib x 14 (9/8) 8/23 SM >> 9/2  8/25 strep pneumo/leg - neg 8/22 BCx: neg 8/22 Covid: positive MRSA ns - neg8/23 Azith/Rocephin x 1 8/23 Vanc >> 8/25 8/23 Cefepime >> 8/29 8/23 Remdesivir >>8/27 8/25 baricitinib x 14 (9/8) 8/23 SM >> 9/2  8/25 strep pneumo/leg - neg 8/22 BCx: neg 8/22  Covid: positive MRSA ns - neg   Thank you for allowing pharmacy to be a part of this patient's care. 9/22, PharmD, MBA Pharmacy Resident [phone] 08/24/2020 8:11 AM

## 2020-08-24 NOTE — TOC Progression Note (Signed)
Transition of Care [facility]) - Progression Note    Patient Details  Name: Chaitanya Amedee MRN: [id number] Date of Birth: [birth year]  Transition of Care [facility]) CM/SW Contact  Lockie Pares, RN Phone Number: 08/24/2020, 12:15 PM  Clinical Narrative:    Spoke to patients wife Leonie Douglas to suggest applying for Medicaid. . 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  They w stated they would let her know. I educated her for any reason that does not go through, she can call DSS, and also our financial team will be looking at things and working with her on any co-pay situation.   Expected Discharge Plan: Home w Home Health Services Barriers to Discharge: Continued Medical Work up  Expected Discharge Plan and Services Expected Discharge Plan: Home w Home Health Services                                               Social Determinants of Health (SDOH) Interventions    Readmission Risk Interventions No flowsheet data found.

## 2020-08-24 NOTE — Procedures (Signed)
Extubation Procedure Note  Patient Details:   Name: Henry May DOB: [birth year] MRN: [id number]   Airway Documentation:    Vent end date: 08/24/20 Vent end time: 1640   Evaluation  O2 sats: stable throughout Complications: No apparent complications Patient did tolerate procedure well. Bilateral Breath Sounds: Clear, Diminished   Yes   Patient extubated to 8L [place] high flow nasal cannula.  Positive cuff leak noted.  No evidence of stridor.  Patient able to speak post extubation.  Sats currently 93%.  Vitals are stable.  No complications noted.    Elyn Peers 08/24/2020, 4:41 PM

## 2020-08-25 DIAGNOSIS — I5021 Acute systolic (congestive) heart failure: Secondary | ICD-10-CM

## 2020-08-25 DIAGNOSIS — D62 Acute posthemorrhagic anemia: Secondary | ICD-10-CM

## 2020-08-25 LAB — GLUCOSE, CAPILLARY
Glucose-Capillary: 108 mg/dL — ABNORMAL HIGH (ref 70–99)
Glucose-Capillary: 112 mg/dL — ABNORMAL HIGH (ref 70–99)
Glucose-Capillary: 120 mg/dL — ABNORMAL HIGH (ref 70–99)
Glucose-Capillary: 120 mg/dL — ABNORMAL HIGH (ref 70–99)
Glucose-Capillary: 136 mg/dL — ABNORMAL HIGH (ref 70–99)
Glucose-Capillary: 139 mg/dL — ABNORMAL HIGH (ref 70–99)

## 2020-08-25 LAB — COMPREHENSIVE METABOLIC PANEL
ALT: 3848 U/L — ABNORMAL HIGH (ref 0–44)
AST: 1609 U/L — ABNORMAL HIGH (ref 15–41)
Albumin: 2.6 g/dL — ABNORMAL LOW (ref 3.5–5.0)
Alkaline Phosphatase: 102 U/L (ref 38–126)
Anion gap: 11 (ref 5–15)
BUN: 64 mg/dL — ABNORMAL HIGH (ref 6–20)
CO2: 23 mmol/L (ref 22–32)
Calcium: 8 mg/dL — ABNORMAL LOW (ref 8.9–10.3)
Chloride: 107 mmol/L (ref 98–111)
Creatinine, Ser: 1.72 mg/dL — ABNORMAL HIGH (ref 0.61–1.24)
GFR calc Af Amer: 50 mL/min — ABNORMAL LOW (ref >60)
GFR calc non Af Amer: 43 mL/min — ABNORMAL LOW (ref >60)
Glucose, Bld: 122 mg/dL — ABNORMAL HIGH (ref 70–99)
Potassium: 4.7 mmol/L (ref 3.5–5.1)
Sodium: 141 mmol/L (ref 135–145)
Total Bilirubin: 1.3 mg/dL — ABNORMAL HIGH (ref 0.3–1.2)
Total Protein: 5.9 g/dL — ABNORMAL LOW (ref 6.5–8.1)

## 2020-08-25 LAB — PROTIME-INR
INR: 1.4 — ABNORMAL HIGH (ref 0.8–1.2)
Prothrombin Time: 16.3 seconds — ABNORMAL HIGH (ref 11.4–15.2)

## 2020-08-25 MED ORDER — ENSURE ENLIVE PO LIQD
237.0000 mL | Freq: Three times a day (TID) | ORAL | Status: DC
  Administered 2020-08-27 – 2020-09-04 (×23): 237 mL via ORAL

## 2020-08-25 MED ORDER — ADULT MULTIVITAMIN W/MINERALS CH
1.0000 | ORAL_TABLET | Freq: Every day | ORAL | Status: DC
  Administered 2020-08-26 – 2020-09-04 (×10): 1 via ORAL
  Filled 2020-08-25 (×10): qty 1

## 2020-08-25 MED ORDER — SENNA 8.6 MG PO TABS
2.0000 | ORAL_TABLET | Freq: Two times a day (BID) | ORAL | Status: DC
  Administered 2020-08-25 – 2020-08-26 (×2): 17.2 mg
  Filled 2020-08-25 (×3): qty 2

## 2020-08-25 MED ORDER — IPRATROPIUM-ALBUTEROL 0.5-2.5 (3) MG/3ML IN SOLN
RESPIRATORY_TRACT | Status: AC
  Filled 2020-08-25: qty 3

## 2020-08-25 MED ORDER — IPRATROPIUM-ALBUTEROL 0.5-2.5 (3) MG/3ML IN SOLN
3.0000 mL | Freq: Three times a day (TID) | RESPIRATORY_TRACT | Status: DC
  Administered 2020-08-25: 3 mL via RESPIRATORY_TRACT
  Filled 2020-08-25: qty 3

## 2020-08-25 MED ORDER — IPRATROPIUM-ALBUTEROL 0.5-2.5 (3) MG/3ML IN SOLN
3.0000 mL | Freq: Four times a day (QID) | RESPIRATORY_TRACT | Status: DC
  Administered 2020-08-25: 3 mL via RESPIRATORY_TRACT

## 2020-08-25 MED ORDER — GUAIFENESIN 100 MG/5ML PO SOLN
5.0000 mL | ORAL | Status: DC | PRN
  Administered 2020-08-25 – 2020-09-03 (×5): 100 mg via ORAL
  Filled 2020-08-25: qty 5
  Filled 2020-08-25 (×3): qty 10

## 2020-08-25 MED ORDER — METOPROLOL TARTRATE 25 MG PO TABS
25.0000 mg | ORAL_TABLET | Freq: Two times a day (BID) | ORAL | Status: DC
  Administered 2020-08-25 – 2020-09-04 (×21): 25 mg via ORAL
  Filled 2020-08-25: qty 1
  Filled 2020-08-25 (×4): qty 2
  Filled 2020-08-25 (×14): qty 1
  Filled 2020-08-25: qty 2
  Filled 2020-08-25: qty 1

## 2020-08-25 MED ORDER — POLYETHYLENE GLYCOL 3350 17 G PO PACK
17.0000 g | PACK | Freq: Two times a day (BID) | ORAL | Status: DC
  Administered 2020-08-25 – 2020-08-26 (×2): 17 g
  Filled 2020-08-25 (×3): qty 1

## 2020-08-25 MED ORDER — MELATONIN 5 MG PO TABS
5.0000 mg | ORAL_TABLET | Freq: Every day | ORAL | Status: DC
  Administered 2020-08-25 – 2020-09-03 (×10): 5 mg via ORAL
  Filled 2020-08-25 (×10): qty 1

## 2020-08-25 NOTE — Progress Notes (Signed)
NAME:  Henry May, MRN:  [id number], DOB:  September 08, 1966, LOS: 19 ADMISSION DATE:  08/06/2020, CONSULTATION DATE:  08/22/20 REFERRING MD:  Toniann Fail, CHIEF COMPLAINT:  Dyspnea   Brief History   54 y/o M with no significant PMH who presented with increasing shortness of breath. He was diagnosed with Covid-19 on 8/12 and SOB progressively worsened.  History of present illness   Henry May 54 year old male with no significant past medical history, non-smoker,  who was diagnosed with Covid-19 on 8/16 along with his wife. He was scheduled to get covid 19 vaccine, but had not received it yet.    Admitted 8/22 and very tenuous on bipap and HFNC throughout hospitalization.   Transferred to 2W on 9/3.   Heparin infusion started 08/20/20  Was doing fairly well, was on HFNC 60%, 30L this am.  Labs fine this am. This afternoon FiO2  Was up to 100%.  Rapid response nurse called this evening due to worsening agitation, hypotension.  Had received oxycodone and xanax at around 630 pm.   I came to evaluate, patient was lethargic, arousable and answering questions with one word, increased work of breathing, pallor, cool and clammy.  BP intermittently quite low.  Responding to fluid boluses.   Oxygen saturation actually 100%.  ABG and basic labs pending.    Planned to transfer to the ICU, place art line for closer monitoring of BP.    When about to transfer, mental status changed, became unresponsive, found to be pulseless.   PEA lasted for about 4 minutes, 1 dose epi 1mg , ROSC achieved.  Hypotensive.   Intubated immediately post arrest.  Started on Levophed 60mcg/min through IO line.  Transferred to ICU. Awake and following commands  Remained Hypotensive, acidemic with high lactate, given bicarbonate boluses and started infusion. Placed arterial line, central line in groin due to emergent need for high dose pressors, fluids, and sedation.     Past Medical History  No past medical history on  file  Significant Hospital Events   8/22 admit to hospitalists 8/23 PCCM consult, ICU txfr  8/25 BiPAP and admitted to the ICU 9/3 transferred out of ICU 9/5 started Heparin  9/8 transferred back to ICU, Acute RP Bleed, Shock   Consults:  PCCM  Procedures:  Intubation 9/7 Central line 9/7 Arterial line 9/7  Significant Diagnostic Tests:  8/22 CXR>>Mild right lung infiltrates with mild to moderate severity left basilar infiltrate.  8/23 ultrasound LE>> acute DVT bilaterally  8/23 echocardiogram>> EF 40 to 45% with regional wall motion abnormality of LV.  Grade 1 diastolic dysfunction.  Severe hypokinesis of LV basal mid inferior wall.  The RV size is mildly enlarged and RA size was moderately dilated   Micro Data:  8/22 Covid-19>>positive 8/22 BCx2>> neg 8/25 MRSA PCR >> negative   Antimicrobials:  Remdesevir 8/22 >> 8/26 baricitinib 8/25 >> Azithromycin 8/22 >> 8/23 Ceftriaxone 8/22 >> 8/23 Vancomycin 8/23>> 8/25 Cefepime 8/23>> 8/29  Interim history/subjective:   Extubated yesterday- doing well on HFNC. Didn't sleep well and coughing all night.  Objective   Blood pressure 134/85, pulse 93, temperature 98 F (36.7 C), temperature source Axillary, resp. rate (!) 28, height 6' (1.829 m), weight 94.7 kg, SpO2 95 %.    Vent Mode: PSV;CPAP FiO2 (%):  [40 %-45 %] 40 % PEEP:  [5 cmH20] 5 cmH20 Pressure Support:  [8 cmH20] 8 cmH20   Intake/Output Summary (Last 24 hours) at 08/25/2020 0832 Last data filed at 08/25/2020 0600 Gross per 24 hour  Intake 671.26 ml  Output 1960 ml  Net -1288.74 ml   Filed Weights   08/20/20 0405 08/23/20 0500 08/24/20 0400  Weight: 89.6 kg 94.8 kg 94.7 kg   General ill appearing middle aged man Eyes:  Anicteric, EOM grossly intact HENT: [place]/AT, oral mucosa moist Neck: bandage over R IJ. Lungs: faint rhales, no rhochi or wheezing. Tachypneic, mildly truncated speech, no accessory muscle use.  CV: RRR, no murmur Abdomen: soft, NT,  hypoactive BS Extremities: no edema, clubbing, or cyanosis Neuro: alert, answering questions appropriately, moving all extremities spontaneously.  Resolved Hospital Problem list     Assessment & Plan:   Hemorrhagic shock, hypothermia, acute metabolic acidosis  Acute retroperitoneal bleed-- stable Acute blood loss anemia on full dose AC- stable Lactic acidosis secondary to above S/p Cardiac Arrest, PEA ~19mins downtime  -con't to monitor -serial H&H -no indication for invasive intervention for RP bleed  Leukocytosis- worsening Acute thrombocytopenia, likely consumptive -con't to monitor -con't to hold antibiotics as long as clinically improving and no fevers or hypothermia or other identified source of infection  Acute hypoxemic respiratory failure secondary to COVID-19 pneumonia Reintubated s/p PEA arrest -adult MV protocol  - wean Fio2 and peep as tolerated  - steroid tapering  - sedation with precedex - attempt sbt today, consideration for extubation soon   Acute heart failure with reduced ejection fraction; likely precipitated by critical illness -goal to maintain euvolemia; diuresis PRN -Will eventually need GDMT. AKI precludes use of spironolactone, ACE, or ARB currently. Can start low-dose metoprolol with eventual goal to transition to long-active metoprolol or coreg as BP allows.  AKI 2/2 covid, now due to cardiac arrest.  -con't to monitor -renally dose meds and avoid nephrotoxic meds -strict I/Os -maintain euvolemia; starting oral diet today  Acute  B LE  DVT, dx 8/23 Anticoagulation held due to hemorrhagic shock from RP bleed; IVC filter on 9/9 -con't to hold AC -OOB mobility ok  QT prolongation  -avoid QTc prolonging meds  Sinus tachycardia, resolved.  Essential hypertension -holding amlodipine; would prefer to start meds that would also address systolic heart failure if renal function allows. Can uptitrate Bblocker.  Elevated transaminases,  hyperbilirubinemia -Likely due to acute illness. Hyperbilirubinemia may be due to degraded blood cells in RP space. - con't to monitor but anticipate that it may worsen before improving.   Best practice:  Diet: full liquids Pain/Anxiety/Delirium protocol (if indicated): Precedex, prn fentanyl and versed.  VAP protocol (if indicated): N/A DVT prophylaxis: OOB mobility; chemical c/i due to RP bleed GI prophylaxis: Protonix Glucose control: Sliding scale Mobility: Bedrest Code Status: Full Family Communication: wife updated at bedside this morning Disposition: ICU; if does well today can transition to progressive later today      Steffanie Dunn, DO 08/25/20 8:54 AM [facility]

## 2020-08-25 NOTE — Progress Notes (Signed)
Physical Therapy Treatment Patient Details Name: Henry May MRN: [id number] DOB: 04-Sep-1966 Today's Date: 08/25/2020    History of Present Illness 54 yo unvaccinated male admitted with hypoxia and WOB due to Covid 19 on 8/22 with initial diagnosis 8/12.  PEA arrest in hospital on 9/7 resulting in intubation.  Pt VDRF. IVC filter placed 9/9.    PT Comments    Pt received in bed, eager and motivated to participate in therapy. Poor understanding of mobility limitations with current cardiopulmonary status. Pt on 10L O2 with SpO2 93% and resting RR 44-53. Extensive time spent focusing on relaxation techniques to bring RR into 30s prior to initiation of mobility. Pt required min assist supine to sit. OT positioned behind pt to provide posterior support while sitting EOB. In sitting, pt with decreased responsiveness (low facial tone, drooling, minimal verbal responses). BP 135/76, HR 120s, and RR averaging in 20-30s. Pt returned to supine +2 min assist with immediate improvement. Pt stating "I just feel weak." Bed placed in chair position. Wife instructed in mild exertion there ex that she can work with pt on while in bed. Educated pt/spouse on parameters for vitals, limiting exertion/mobility/exercise if RR >39 and/or SpO2 <90%.   Follow Up Recommendations  Supervision/Assistance - 24 hour;CIR     Equipment Recommendations  Rolling walker with 5" wheels    Recommendations for Other Services       Precautions / Restrictions Precautions Precautions: Other (comment) Precaution Comments: watch sats    Mobility  Bed Mobility Overal bed mobility: Needs Assistance Bed Mobility: Supine to Sit;Sit to Supine     Supine to sit: Min assist;HOB elevated Sit to supine: +2 for physical assistance;Min assist   General bed mobility comments: +rails, cues for sequencing. Assist to elevate trunk. Assist with trunk and BLE back to bed.  Transfers                 General transfer comment:  unable to progress beyond EOB due to cardiopulmonary status. Poor activity tolerance. Desat. Increased RR.  Ambulation/Gait                 Stairs             Wheelchair Mobility    Modified Rankin (Stroke Patients Only)       Balance Overall balance assessment: Needs assistance Sitting-balance support: Feet unsupported;Single extremity supported Sitting balance-Leahy Scale: Fair                                      Cognition Arousal/Alertness: Awake/alert Behavior During Therapy: WFL for tasks assessed/performed Overall Cognitive Status: Impaired/Different from baseline Area of Impairment: Safety/judgement;Problem solving;Awareness                         Safety/Judgement: Decreased awareness of safety;Decreased awareness of deficits Awareness: Emergent Problem Solving: Slow processing;Requires verbal cues General Comments: Follows directions well. Poor understanding of his current medical status/limitations. "I want to walk."      Exercises      General Comments General comments (skin integrity, edema, etc.): Pt on 10L HFNC. SpO2 93%, RR 44-53, BP 131/90 at rest in bed prior to mobility. Extended time concentrating on relaxation techniques/cues for decreased RR. RR in 30s when mobility initiated. Upon sitting EOB, pt with decreased responsiveness. RR as low as 19 but averaged 20-30s. BP 135/76 and HR 120s. Pt drooling, decreased facial  tone, and minimal verbal interactions. Pt returned to supine with improvement in status. Pt stating "I don't know what happened. I just feel weak."      Pertinent Vitals/Pain Pain Assessment: No/denies pain    Home Living                      Prior Function            PT Goals (current goals can now be found in the care plan section) Progress towards PT goals: Progressing toward goals (slowly)    Frequency    Min 3X/week      PT Plan Current plan remains appropriate     Co-evaluation PT/OT/SLP Co-Evaluation/Treatment: Yes Reason for Co-Treatment: Complexity of the patient's impairments (multi-system involvement);For patient/therapist safety PT goals addressed during session: Mobility/safety with mobility;Strengthening/ROM        AM-PAC PT "6 Clicks" Mobility   Outcome Measure  Help needed turning from your back to your side while in a flat bed without using bedrails?: A Little Help needed moving from lying on your back to sitting on the side of a flat bed without using bedrails?: A Lot Help needed moving to and from a bed to a chair (including a wheelchair)?: A Lot Help needed standing up from a chair using your arms (e.g., wheelchair or bedside chair)?: A Lot Help needed to walk in hospital room?: Total Help needed climbing 3-5 steps with a railing? : Total 6 Click Score: 11    End of Session Equipment Utilized During Treatment: Oxygen Activity Tolerance: Patient limited by fatigue;Treatment limited secondary to medical complications (Comment) (high RR) Patient left: in bed;with call bell/phone within reach;with family/visitor present Nurse Communication: Mobility status PT Visit Diagnosis: Other abnormalities of gait and mobility (R26.89);Muscle weakness (generalized) (M62.81);Difficulty in walking, not elsewhere classified (R26.2)     Time: 1610-9604 PT Time Calculation (min) (ACUTE ONLY): 32 min  Charges:  $Therapeutic Activity: 8-22 mins                     Aida Raider, PT  Office # [phone] Pager [phone]    Ilda Foil 08/25/2020, 1:39 PM

## 2020-08-25 NOTE — TOC Progression Note (Signed)
Transition of Care [facility]) - Progression Note    Patient Details  Name: Henry May MRN: [id number] Date of Birth: 1966/03/10  Transition of Care [facility]) CM/SW Contact  Lockie Pares, RN Phone Number: 08/25/2020, 9:31 AM  Clinical Narrative:    Cont to be intubated sedation post arrest RP bleed found post iVC. Watching labs and serial H&H . Spoke to wife yesterday about Medicaid, however, she states that job is getting him insurance, but she has had not updates on that yet. Will let us know when she hears from his company. Updated Employment in system.    Expected Discharge Plan: Home w Home Health Services Barriers to Discharge: Continued Medical Work up  Expected Discharge Plan and Services Expected Discharge Plan: Home w Home Health Services                                               Social Determinants of Health (SDOH) Interventions    Readmission Risk Interventions No flowsheet data found.

## 2020-08-25 NOTE — Progress Notes (Signed)
Occupational Therapy Treatment Patient Details Name: Henry May MRN: [id number] DOB: 11-Mar-1966 Today's Date: 08/25/2020    History of present illness 54 yo unvaccinated male admitted with hypoxia and WOB due to Covid 19 on 8/22 with initial diagnosis 8/12.  PEA arrest in hospital on 9/7 resulting in intubation.  Pt VDRF. IVC filter placed 9/9.   OT comments  Pt continues to present with decreased activity tolerance; however, pt is still very motivated to participate in therapy stating "I just want to walk". Upon arrival, pt supine in bed with RR 40-50s. Providing cues for purse lip breathing and to slow rate; pt requiring several minutes with Max coaching to slow RR to 20-30s (lowest 19). Pt requiring Min A +2 for bed mobility. Once at EOB, pt with lethargy and decreased responsiveness (minimal verbalization and drooling). BP 135/76, HR 120s, and RR averaging in 20-30s. Returned to supine with Min A +2. Once in supine, immediate improvement and pt alert and stable. Educated pt/spouse on parameters for vitals, limiting exertion/mobility/exercise if RR >39 and/or SpO2 <90%. Continue to recommend dc to CIR and will continue to follow acutely as admitted.    Follow Up Recommendations  CIR;Supervision/Assistance - 24 hour    Equipment Recommendations  3 in 1 bedside commode    Recommendations for Other Services PT consult;Rehab consult    Precautions / Restrictions Precautions Precautions: Other (comment) Precaution Comments: watch sats       Mobility Bed Mobility Overal bed mobility: Needs Assistance Bed Mobility: Supine to Sit;Sit to Supine Rolling: Supervision   Supine to sit: Min assist;HOB elevated Sit to supine: +2 for physical assistance;Min assist   General bed mobility comments: +rails, cues for sequencing. Assist to elevate trunk. Assist with trunk and BLE back to bed.  Transfers                 General transfer comment: unable to progress beyond EOB due to  cardiopulmonary status. Poor activity tolerance. Desat. Increased RR.    Balance Overall balance assessment: Needs assistance Sitting-balance support: Feet unsupported;Single extremity supported Sitting balance-Leahy Scale: Fair                                     ADL either performed or assessed with clinical judgement   ADL Overall ADL's : Needs assistance/impaired                                       General ADL Comments: Focused session on activity tolerance. Pt sitting at EOB for ~3 minutes. Poor tolerance.     Vision       Perception     Praxis      Cognition Arousal/Alertness: Awake/alert Behavior During Therapy: WFL for tasks assessed/performed Overall Cognitive Status: Impaired/Different from baseline Area of Impairment: Safety/judgement;Problem solving;Awareness                 Orientation Level: Time       Safety/Judgement: Decreased awareness of safety;Decreased awareness of deficits Awareness: Emergent Problem Solving: Slow processing;Requires verbal cues General Comments: Follows directions well. Poor understanding of his current medical status/limitations. "I want to walk."        Exercises General Exercises - Lower Extremity Heel Slides: AROM;Left;10 reps;Supine Hip ABduction/ADduction: AROM;Left;10 reps;Supine Straight Leg Raises: AROM;Left;10 reps;Supine Other Exercises Other Exercises: worked on coaching with efficient  breathing technique which today did not help significantly.  Therefore pt returned to supine where SpO2 slowly improved.   Shoulder Instructions       General Comments Pt on 10L HFNC. SpO2 93%, RR 44-53, BP 131/90 at rest in bed prior to mobility. Extended time concentrating on relaxation techniques/cues for decreased RR. RR in 30s when mobility initiated. Upon sitting EOB, pt with decreased responsiveness. RR as low as 19 but averaged 20-30s. BP 135/76 and HR 120s. Pt drooling, decreased  facial tone, and minimal verbal interactions. Pt returned to supine with improvement in status. Pt stating "I don't know what happened. 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 OT Goal Formulation: With patient Time For Goal Achievement: 08/29/20 Potential to Achieve Goals: Good ADL Goals Pt Will Perform Grooming: sitting;with modified independence Pt Will Perform Lower Body Dressing: with set-up;with supervision;sit to/from stand Pt Will Transfer to Toilet: with set-up;with supervision;ambulating;bedside commode Pt Will Perform Toileting - Clothing Manipulation and hygiene: with set-up;with supervision;sitting/lateral leans Pt/caregiver will Perform Home Exercise Program: Increased strength;Independently;With written HEP provided;With theraband Additional ADL Goal #1: Pt will independently verbalize three energy conservation techniques for ADLs Additional ADL Goal #2: Pt will monitor SpO2 and use purse lip breathing with Min cues  Plan Discharge plan remains appropriate    Co-evaluation    PT/OT/SLP Co-Evaluation/Treatment: Yes Reason for Co-Treatment: For patient/therapist safety;To address functional/ADL transfers   OT goals addressed during session: ADL's and self-care      AM-PAC OT "6 Clicks" Daily Activity     Outcome Measure   Help from another person eating meals?: A Little Help from another person taking care of personal grooming?: A Little Help from another person toileting, which includes using toliet, bedpan, or urinal?: A Little Help from another person  bathing (including washing, rinsing, drying)?: A Little Help from another person to put on and taking off regular upper body clothing?: A Little Help from another person to put on and taking off regular lower body clothing?: A Lot 6 Click Score: 17    End of Session Equipment Utilized During Treatment: Oxygen (10L HFNC)  OT Visit Diagnosis: Unsteadiness on feet (R26.81);Other abnormalities of gait and mobility (R26.89);Muscle weakness (generalized) (M62.81)   Activity Tolerance Patient tolerated treatment well;Patient limited by fatigue   Patient Left in bed;with call bell/phone within reach;with family/visitor present   Nurse Communication Mobility status;Other (comment) (Vitals)        Time: 9892-1194 OT Time Calculation (min): 32 min  Charges: OT General Charges $OT Visit: 1 Visit OT Treatments $Therapeutic Activity: 8-22 mins  Braeden Dolinski MSOT, OTR/L Acute Rehab Pager: [phone] Office: [phone]   Theodoro Grist [place] 08/25/2020, 5:53 PM

## 2020-08-25 NOTE — Progress Notes (Signed)
Nutrition Follow-up  DOCUMENTATION CODES:   Not applicable  INTERVENTION:    Ensure Enlive po TID, each supplement provides 350 kcal and 20 grams of protein  Change liquid MVI to MVI with minerals tablet daily.  NUTRITION DIAGNOSIS:   Increased nutrient needs related to catabolic illness (COVID-19) as evidenced by estimated needs.  Ongoing   GOAL:   Patient will meet greater than or equal to 90% of their needs  Progressing  MONITOR:   PO intake, Supplement acceptance, Labs   ASSESSMENT:   54 yo male with no significant PMH admitted with increasing SOB, COVID positive on 8/12.  RD discussed patient in ICU rounds today. Extubated 9/9. Requiring 11 L HFNC this morning. Diet has been advanced to full liquids. Prior to intubation, patient's family was bringing in his meals because he did not like the hospital food.    Labs reviewed. BUN 64, Creat 1.72 CBG: 120-108-136  Medications reviewed and include novolog, solu-medrol, liquid MVI, miralax, senokot.    Weight 94.7 kg today, 96.3 kg on admission 8/25. UOP 2,175 ml x 24 hours.   Diet Order:   Diet Order            Diet full liquid Room service appropriate? Yes; Fluid consistency: Thin  Diet effective now                 EDUCATION NEEDS:   No education needs have been identified at this time  Skin:  Skin Assessment: Reviewed RN Assessment  Last BM:  9/5 type 4  Height:   Ht Readings from Last 1 Encounters:  08/23/20 6' (1.829 m)    Weight:   Wt Readings from Last 1 Encounters:  08/24/20 94.7 kg    Ideal Body Weight:  80.9 kg  BMI:  Body mass index is 28.32 kg/m.  Estimated Nutritional Needs:   Kcal:  2500-2800  Protein:  140-160 gm  Fluid:  >/= 2.5    Gabriel Rainwater, RD, LDN, CNSC Please refer to Amion for contact information.

## 2020-08-26 ENCOUNTER — Inpatient Hospital Stay (HOSPITAL_COMMUNITY): Payer: BC Managed Care – PPO

## 2020-08-26 DIAGNOSIS — I469 Cardiac arrest, cause unspecified: Secondary | ICD-10-CM

## 2020-08-26 DIAGNOSIS — I340 Nonrheumatic mitral (valve) insufficiency: Secondary | ICD-10-CM

## 2020-08-26 LAB — CBC
HCT: 31.8 % — ABNORMAL LOW (ref 39.0–52.0)
Hemoglobin: 10.1 g/dL — ABNORMAL LOW (ref 13.0–17.0)
MCH: 28.7 pg (ref 26.0–34.0)
MCHC: 31.8 g/dL (ref 30.0–36.0)
MCV: 90.3 fL (ref 80.0–100.0)
Platelets: 169 10*3/uL (ref 150–400)
RBC: 3.52 MIL/uL — ABNORMAL LOW (ref 4.22–5.81)
RDW: 18.8 % — ABNORMAL HIGH (ref 11.5–15.5)
WBC: 17.6 10*3/uL — ABNORMAL HIGH (ref 4.0–10.5)
nRBC: 9.1 % — ABNORMAL HIGH (ref 0.0–0.2)

## 2020-08-26 LAB — GLUCOSE, CAPILLARY
Glucose-Capillary: 105 mg/dL — ABNORMAL HIGH (ref 70–99)
Glucose-Capillary: 108 mg/dL — ABNORMAL HIGH (ref 70–99)
Glucose-Capillary: 117 mg/dL — ABNORMAL HIGH (ref 70–99)
Glucose-Capillary: 151 mg/dL — ABNORMAL HIGH (ref 70–99)
Glucose-Capillary: 189 mg/dL — ABNORMAL HIGH (ref 70–99)
Glucose-Capillary: 96 mg/dL (ref 70–99)

## 2020-08-26 LAB — ECHOCARDIOGRAM LIMITED
Area-P 1/2: 3.1 cm2
Calc EF: 59.3 %
Height: 72 in
S' Lateral: 4.5 cm
Single Plane A2C EF: 55.7 %
Single Plane A4C EF: 61.7 %
Weight: 3224.01 oz

## 2020-08-26 LAB — COMPREHENSIVE METABOLIC PANEL
ALT: 2222 U/L — ABNORMAL HIGH (ref 0–44)
AST: 420 U/L — ABNORMAL HIGH (ref 15–41)
Albumin: 2.4 g/dL — ABNORMAL LOW (ref 3.5–5.0)
Alkaline Phosphatase: 112 U/L (ref 38–126)
Anion gap: 8 (ref 5–15)
BUN: 46 mg/dL — ABNORMAL HIGH (ref 6–20)
CO2: 25 mmol/L (ref 22–32)
Calcium: 8.1 mg/dL — ABNORMAL LOW (ref 8.9–10.3)
Chloride: 109 mmol/L (ref 98–111)
Creatinine, Ser: 1.33 mg/dL — ABNORMAL HIGH (ref 0.61–1.24)
GFR calc Af Amer: >60 mL/min (ref >60)
GFR calc non Af Amer: 59 mL/min — ABNORMAL LOW (ref >60)
Glucose, Bld: 131 mg/dL — ABNORMAL HIGH (ref 70–99)
Potassium: 5 mmol/L (ref 3.5–5.1)
Sodium: 142 mmol/L (ref 135–145)
Total Bilirubin: 1.2 mg/dL (ref 0.3–1.2)
Total Protein: 5.5 g/dL — ABNORMAL LOW (ref 6.5–8.1)

## 2020-08-26 LAB — PROTIME-INR
INR: 1.3 — ABNORMAL HIGH (ref 0.8–1.2)
Prothrombin Time: 15.7 seconds — ABNORMAL HIGH (ref 11.4–15.2)

## 2020-08-26 MED ORDER — IPRATROPIUM-ALBUTEROL 0.5-2.5 (3) MG/3ML IN SOLN
3.0000 mL | Freq: Four times a day (QID) | RESPIRATORY_TRACT | Status: DC | PRN
  Administered 2020-08-28: 3 mL via RESPIRATORY_TRACT
  Filled 2020-08-26 (×2): qty 3

## 2020-08-26 MED ORDER — SENNA 8.6 MG PO TABS
2.0000 | ORAL_TABLET | Freq: Two times a day (BID) | ORAL | Status: DC
  Administered 2020-08-26 – 2020-09-04 (×13): 17.2 mg via ORAL
  Filled 2020-08-26 (×12): qty 2

## 2020-08-26 MED ORDER — POLYETHYLENE GLYCOL 3350 17 G PO PACK
17.0000 g | PACK | Freq: Two times a day (BID) | ORAL | Status: DC
  Administered 2020-08-26 – 2020-09-04 (×12): 17 g via ORAL
  Filled 2020-08-26 (×15): qty 1

## 2020-08-26 NOTE — Progress Notes (Signed)
NAME:  Henry May, MRN:  [id number], DOB:  [birth year], LOS: 20 ADMISSION DATE:  08/06/2020, CONSULTATION DATE:  08/22/20 REFERRING MD:  Toniann Fail, CHIEF COMPLAINT:  Dyspnea   Brief History   54 y/o M with no significant PMH who presented with increasing shortness of breath. He was diagnosed with Covid-19 on 8/12 and SOB progressively worsened.  History of present illness   Henry May 54 year old male with no significant past medical history, non-smoker,  who was diagnosed with Covid-19 on 8/16 along with his wife. He was scheduled to get covid 19 vaccine, but had not received it yet.    Admitted 8/22 and very tenuous on bipap and HFNC throughout hospitalization.   Transferred to 2W on 9/3.   Heparin infusion started 08/20/20  Was doing fairly well, was on HFNC 60%, 30L this am.  Labs fine this am. This afternoon FiO2  Was up to 100%.  Rapid response nurse called this evening due to worsening agitation, hypotension.  Had received oxycodone and xanax at around 630 pm.   I came to evaluate, patient was lethargic, arousable and answering questions with one word, increased work of breathing, pallor, cool and clammy.  BP intermittently quite low.  Responding to fluid boluses.   Oxygen saturation actually 100%.  ABG and basic labs pending.    Planned to transfer to the ICU, place art line for closer monitoring of BP.    When about to transfer, mental status changed, became unresponsive, found to be pulseless.   PEA lasted for about 4 minutes, 1 dose epi 1mg , ROSC achieved.  Hypotensive.   Intubated immediately post arrest.  Started on Levophed 37mcg/min through IO line.  Transferred to ICU. Awake and following commands  Remained Hypotensive, acidemic with high lactate, given bicarbonate boluses and started infusion. Placed arterial line, central line in groin due to emergent need for high dose pressors, fluids, and sedation.     Past Medical History  No past medical history on  file  Significant Hospital Events   8/22 admit to hospitalists 8/23 PCCM consult, ICU txfr  8/25 BiPAP and admitted to the ICU 9/3 transferred out of ICU 9/5 started Heparin  9/8 transferred back to ICU, Acute RP Bleed, Shock   Consults:  PCCM  Procedures:  Intubation 9/7 Central line 9/7 Arterial line 9/7  Significant Diagnostic Tests:  8/22 CXR>>Mild right lung infiltrates with mild to moderate severity left basilar infiltrate.  8/23 ultrasound LE>> acute DVT bilaterally  8/23 echocardiogram>> EF 40 to 45% with regional wall motion abnormality of LV.  Grade 1 diastolic dysfunction.  Severe hypokinesis of LV basal mid inferior wall.  The RV size is mildly enlarged and RA size was moderately dilated   Micro Data:  8/22 Covid-19>>positive 8/22 BCx2>> neg 8/25 MRSA PCR >> negative  Antimicrobials:  Remdesevir 8/22 >> 8/26 baricitinib 8/25 >> Azithromycin 8/22 >> 8/23 Ceftriaxone 8/22 >> 8/23 Vancomycin 8/23>> 8/25 Cefepime 8/23>> 8/29  Interim history/subjective:   Extubated 9/9 Doing well today with no significant concerns Still gets quite short of breath with activity  Objective   Blood pressure 124/82, pulse 92, temperature 98.1 F (36.7 C), temperature source Axillary, resp. rate (!) 40, height 6' (1.829 m), weight 91.4 kg, SpO2 95 %.    FiO2 (%):  [100 %] 100 %   Intake/Output Summary (Last 24 hours) at 08/26/2020 1125 Last data filed at 08/26/2020 1043 Gross per 24 hour  Intake 360 ml  Output 2270 ml  Net -1910 ml  Filed Weights   08/24/20 0400 08/25/20 0500 08/26/20 0600  Weight: 94.7 kg 92.4 kg 91.4 kg   Generally ill-appearing, Eyes: Nonicteric HENT: Moist oral mucosa Neck: bandage over R IJ. Lungs: Rales at the bases  CV: S1-S2 appreciated Abdomen: Soft, bowel sounds appreciated Extremities: no edema, clubbing, or cyanosis Neuro: alert, answering questions appropriately, moving all extremities spontaneously.  Resolved Hospital Problem list      Assessment & Plan:   Hemorrhagic shock, hypothermia, acute metabolic acidosis Acute retroperitoneal bleed-stable Lactic acidosis secondary to above Status post cardiac arrest, PEA lasted about 4 minutes -Hemodynamically stable today -No intervention required for retroperitoneal bleed -Will continue to monitor  Thrombocytopenia Leukocytosis -We will continue to monitor -No indication for antibiotics at present but will monitor closely   Hypoxemic respiratory failure secondary to COVID-19 pneumonia Successfully extubated -Continue oxygen supplementation  Acute heart failure with reduced ejection fraction -Started on metoprolol  Acute kidney injury -Renally dose medications -Maintain euvolemia -Avoid nephrotoxic's  Bilateral lower extremity DVT -Post IVC filter 9/9 -Hold anticoagulation  QT prolongation -Continue to monitor   Sinus tachycardia, resolved.  Essential hypertension -Continue beta-blockade  Elevated transaminases, hyperbilirubinemia -Likely due to acute illness. Hyperbilirubinemia may be due to degraded blood cells in RP space. - con't to monitor but anticipate that it may worsen before improving.  I will consider transferring to progressive floor today if he continues to remain stable Risk of decompensation however still significant weight is respiratory failure and high FiO2 requirement  Best practice:  Diet: full liquids Pain/Anxiety/Delirium protocol (if indicated): Pain meds as needed VAP protocol (if indicated): N/A DVT prophylaxis: OOB mobility; chemical c/i due to RP bleed GI prophylaxis: Protonix Glucose control: SSI Mobility: Bedrest Code Status: Full Family Communication: Spouse updated at bedside Disposition: ICU; if does well today can transition to progressive later today  The patient is critically ill with multiple organ systems failure and requires high complexity decision making for assessment and support, frequent evaluation  and titration of therapies, application of advanced monitoring technologies and extensive interpretation of multiple databases. Critical Care Time devoted to patient care services described in this note independent of APP/resident time (if applicable)  is 32 minutes.   Virl Diamond MD [facility] Personal pager: [phone] If unanswered, please page CCM On-call: #[phone]

## 2020-08-26 NOTE — Progress Notes (Signed)
  Echocardiogram 2D Echocardiogram limtied has been performed.  Leta Jungling M 08/26/2020, 1:02 PM

## 2020-08-27 LAB — COMPREHENSIVE METABOLIC PANEL
ALT: 1551 U/L — ABNORMAL HIGH (ref 0–44)
AST: 175 U/L — ABNORMAL HIGH (ref 15–41)
Albumin: 2.5 g/dL — ABNORMAL LOW (ref 3.5–5.0)
Alkaline Phosphatase: 124 U/L (ref 38–126)
Anion gap: 6 (ref 5–15)
BUN: 36 mg/dL — ABNORMAL HIGH (ref 6–20)
CO2: 26 mmol/L (ref 22–32)
Calcium: 8.6 mg/dL — ABNORMAL LOW (ref 8.9–10.3)
Chloride: 107 mmol/L (ref 98–111)
Creatinine, Ser: 1.1 mg/dL (ref 0.61–1.24)
GFR calc Af Amer: >60 mL/min (ref >60)
GFR calc non Af Amer: >60 mL/min (ref >60)
Glucose, Bld: 109 mg/dL — ABNORMAL HIGH (ref 70–99)
Potassium: 5.2 mmol/L — ABNORMAL HIGH (ref 3.5–5.1)
Sodium: 139 mmol/L (ref 135–145)
Total Bilirubin: 0.9 mg/dL (ref 0.3–1.2)
Total Protein: 5.8 g/dL — ABNORMAL LOW (ref 6.5–8.1)

## 2020-08-27 LAB — GLUCOSE, CAPILLARY
Glucose-Capillary: 125 mg/dL — ABNORMAL HIGH (ref 70–99)
Glucose-Capillary: 146 mg/dL — ABNORMAL HIGH (ref 70–99)
Glucose-Capillary: 154 mg/dL — ABNORMAL HIGH (ref 70–99)
Glucose-Capillary: 156 mg/dL — ABNORMAL HIGH (ref 70–99)
Glucose-Capillary: 81 mg/dL (ref 70–99)
Glucose-Capillary: 96 mg/dL (ref 70–99)

## 2020-08-27 LAB — CBC
HCT: 32.5 % — ABNORMAL LOW (ref 39.0–52.0)
Hemoglobin: 10.4 g/dL — ABNORMAL LOW (ref 13.0–17.0)
MCH: 29 pg (ref 26.0–34.0)
MCHC: 32 g/dL (ref 30.0–36.0)
MCV: 90.5 fL (ref 80.0–100.0)
Platelets: 201 10*3/uL (ref 150–400)
RBC: 3.59 MIL/uL — ABNORMAL LOW (ref 4.22–5.81)
RDW: 18 % — ABNORMAL HIGH (ref 11.5–15.5)
WBC: 17.1 10*3/uL — ABNORMAL HIGH (ref 4.0–10.5)
nRBC: 6.1 % — ABNORMAL HIGH (ref 0.0–0.2)

## 2020-08-27 LAB — POTASSIUM: Potassium: 4.9 mmol/L (ref 3.5–5.1)

## 2020-08-27 LAB — PROTIME-INR
INR: 1.2 (ref 0.8–1.2)
Prothrombin Time: 14.4 seconds (ref 11.4–15.2)

## 2020-08-27 MED ORDER — DOCUSATE SODIUM 100 MG PO CAPS
100.0000 mg | ORAL_CAPSULE | Freq: Two times a day (BID) | ORAL | Status: DC | PRN
  Administered 2020-08-27 – 2020-08-29 (×2): 100 mg via ORAL
  Filled 2020-08-27: qty 1

## 2020-08-27 NOTE — Progress Notes (Signed)
PROGRESS NOTE    Henry May  ZOX:[id number] DOB: 1966/09/01 DOA: 08/06/2020 PCP: Patient, No Pcp Per    Brief Narrative:  54 years old male with no significant past medical history presents in the ED with increasing shortness of breath.  He was diagnosed with COVID-19 on 8/12 and then has worsening shortness of breath.  He was admitted on 8/22 for acute respiratory failure secondary to Covid pneumonia requiring high flow oxygen and BiPAP.  He was transferred to the ICU for further care.  He has received treatment for Covid pneumonia.  He was transferred out of the ICU on 9/3.  He has developed lower extremity DVT, was started on heparin. Later has developed large retroperitoneal hematoma resulting in brief PEA.  Patient remained hypotensive requiring levophed,  intubated post arrest and transferred back to ICU.  He underwent IVC filter placement, anticoagulation discontinued.  He was successfully extubated 9/9, off pressors .  He is back on high flow nasal cannula at 12 L sats 94%.  still quite short of breath with activity.  He is off airborne isolation.  Assessment & Plan:   Principal Problem:   Acute respiratory failure due to COVID-19 [facility]) Active Problems:   ARF (acute renal failure) (HCC)   Elevated LFTs   Acute respiratory disease due to COVID-19 virus   Pneumonia due to COVID-19 virus   Acute hypoxemic respiratory failure due to COVID-19 [facility])   Leg DVT (deep venous thromboembolism), acute, bilateral (HCC)   Chest pain   Acute hypoxemic respiratory failure sec to COVID-19 pneumonia : He required high flow oxygen and BiPAP,  admitted in the ICU.  Successfully extubated 9/9. -He is still requiring HFNC @ 12L -Keep saturation above 94%.  Continue supplemental oxygen, wean as tolerated. -He has completed treatment for Covid pneumonia. -He is off airborne precautions.  Acute retroperitoneal bleed -  Stable. Has developed Large left retroperitoneal bleeding.   Required 2 units  PRBC , 2 FFPS.  Hb 10.4 stable.  Hemorrhagic shock: resolved  Status post cardiac arrest, PEA lasted about 4 minutes -Hemodynamically stable  Now. -No intervention required for retroperitoneal bleed. - Required 2 units PRBC/ 2 FFP -Will continue to monitor  Thrombocytopenia - Resolved.  Leukocytosis -We will continue to monitor. -No indication for antibiotics at present but will monitor closely   Acute heart failure with normal ejection fraction: -continue metoprolol  Acute kidney injury - Resolved  -Renally dose medications -Maintain euvolemia -Avoid nephrotoxic's  Bilateral lower extremity DVT. -Post IVC filter 9/9 -Hold anticoagulation.  QT prolongation -Continue to monitor.  Sinus tachycardia:  resolved.  Essential hypertension -Continue beta-blockade.  Elevated transaminases, hyperbilirubinemia -Likely due to acute illness.  Hyperbilirubinemia may be due to degraded blood cells in RP space. - con't to monitor but anticipate that it may worsen before improving. - LFTS trending down.    DVT prophylaxis: SCDs Code Status: Full Family Communication: Wife was at bed side. Disposition Plan:  Status is: Inpatient  Remains inpatient appropriate because:Inpatient level of care appropriate due to severity of illness   Dispo: The patient is from: Home              Anticipated d/c is to: Home              Anticipated d/c date is: 3 days              Patient currently is not medically stable to d/c.   Consultants:   PCCM   Procedures: Intubated, extubated, central  lines.  Antimicrobials: Anti-infectives (From admission, onward)   Start     Dose/Rate Route Frequency Ordered Stop   08/24/20 2200  ceFEPIme (MAXIPIME) 2 g in sodium chloride 0.9 % 100 mL IVPB  Status:  Discontinued        2 g 200 mL/hr over 30 Minutes Intravenous Every 12 hours 08/24/20 0813 08/24/20 0828   08/24/20 0900  vancomycin (VANCOREADY) IVPB 1750 mg/350 mL  Status:   Discontinued        1,750 mg 175 mL/hr over 120 Minutes Intravenous  Once 08/24/20 0813 08/24/20 0828   08/23/20 0400  ceFEPIme (MAXIPIME) 2 g in sodium chloride 0.9 % 100 mL IVPB  Status:  Discontinued        2 g 200 mL/hr over 30 Minutes Intravenous Every 24 hours 08/23/20 0256 08/24/20 0813   08/23/20 0400  vancomycin (VANCOREADY) IVPB 1750 mg/350 mL        1,750 mg 175 mL/hr over 120 Minutes Intravenous  Once 08/23/20 0256 08/23/20 0740   08/23/20 0345  metroNIDAZOLE (FLAGYL) IVPB 500 mg  Status:  Discontinued        500 mg 100 mL/hr over 60 Minutes Intravenous Every 8 hours 08/23/20 0249 08/24/20 0828   08/23/20 0256  vancomycin variable dose per unstable renal function (pharmacist dosing)  Status:  Discontinued         Does not apply See admin instructions 08/23/20 0256 08/24/20 0828   08/09/20 0600  vancomycin (VANCOREADY) IVPB 750 mg/150 mL  Status:  Discontinued        750 mg 150 mL/hr over 60 Minutes Intravenous Every 12 hours 08/08/20 1342 08/09/20 1153   08/08/20 0600  vancomycin (VANCOREADY) IVPB 1250 mg/250 mL  Status:  Discontinued        1,250 mg 166.7 mL/hr over 90 Minutes Intravenous Every 24 hours 08/07/20 0602 08/08/20 1342   08/08/20 0000  cefTRIAXone (ROCEPHIN) 2 g in sodium chloride 0.9 % 100 mL IVPB  Status:  Discontinued        2 g 200 mL/hr over 30 Minutes Intravenous Every 24 hours 08/07/20 0119 08/07/20 0535   08/07/20 1000  remdesivir 100 mg in sodium chloride 0.9 % 100 mL IVPB       "Followed by" Linked Group Details   100 mg 200 mL/hr over 30 Minutes Intravenous Every 24 hours 08/06/20 1931 08/10/20 1130   08/07/20 0630  ceFEPIme (MAXIPIME) 2 g in sodium chloride 0.9 % 100 mL IVPB        2 g 200 mL/hr over 30 Minutes Intravenous Every 8 hours 08/07/20 0602 08/13/20 2155   08/07/20 0630  vancomycin (VANCOREADY) IVPB 2000 mg/400 mL        2,000 mg 200 mL/hr over 120 Minutes Intravenous  Once 08/07/20 0602 08/07/20 0846   08/07/20 0200  azithromycin  (ZITHROMAX) 500 mg in sodium chloride 0.9 % 250 mL IVPB  Status:  Discontinued        500 mg 250 mL/hr over 60 Minutes Intravenous Every 24 hours 08/07/20 0119 08/07/20 0535   08/07/20 0130  cefTRIAXone (ROCEPHIN) 2 g in sodium chloride 0.9 % 100 mL IVPB        2 g 200 mL/hr over 30 Minutes Intravenous  Once 08/07/20 0124 08/07/20 0251   08/06/20 2000  remdesivir 200 mg in sodium chloride 0.9% 250 mL IVPB       "Followed by" Linked Group Details   200 mg 580 mL/hr over 30 Minutes Intravenous Once 08/06/20 1931 08/07/20  0308      Subjective: Patient was seen and examined at bedside.  Overnight events noted.  Patient is still tachypneic and short of breath while talking.  His O2 saturation is 94% on 12 L high flow nasal cannula.  He reports feeling much better, denies any chest pain.  Echocardiogram is within normal limits,  left leg is slightly swollen.  Objective: Vitals:   08/27/20 1200 08/27/20 1211 08/27/20 1507 08/27/20 1546  BP: (!) 123/59   132/78  Pulse: 88   90  Resp: (!) 29   (!) 24  Temp:  99.6 F (37.6 C)  98.1 F (36.7 C)  TempSrc:  Axillary    SpO2: 91%  93% 92%  Weight:      Height:        Intake/Output Summary (Last 24 hours) at 08/27/2020 1622 Last data filed at 08/27/2020 1529 Gross per 24 hour  Intake 540 ml  Output 2540 ml  Net -2000 ml   Filed Weights   08/25/20 0500 08/26/20 0600 08/27/20 0347  Weight: 92.4 kg 91.4 kg 91.2 kg    Examination:  General exam: Appears short of breath while talking , Increased resp.rate. Respiratory system: Clear to auscultation. Slightly increased work of breathing  Cardiovascular system: S1 & S2 heard, RRR. No JVD, murmurs, rubs, gallops or clicks. No pedal edema. Gastrointestinal system: Abdomen is nondistended, soft and nontender. No organomegaly or masses felt. Normal bowel sounds heard. Central nervous system: Alert and oriented. No focal neurological deficits. Extremities: Left leg slightly swollen, no  tenderness. Skin: No rashes, lesions or ulcers Psychiatry: Judgement and insight appear normal. Mood & affect appropriate.     Data Reviewed: I have personally reviewed following labs and imaging studies  CBC: Recent Labs  Lab 08/23/20 0011 08/23/20 0335 08/23/20 1233 08/23/20 1233 08/23/20 1858 08/24/20 0250 08/24/20 0253 08/26/20 0129 08/27/20 0031  WBC 22.9*   < > 16.5*  --  17.1* 20.1*  --  17.6* 17.1*  NEUTROABS 17.8*  --   --   --   --   --   --   --   --   HGB 5.9*  6.1*   < > 10.5*   < > 10.2* 10.3* 10.2* 10.1* 10.4*  HCT 18.4*  18.0*   < > 30.7*   < > 29.6* 29.5* 30.0* 31.8* 32.5*  MCV 98.4   < > 83.7  --  83.6 83.8  --  90.3 90.5  PLT 245   < > 123*  --  PLATELET CLUMPS NOTED ON SMEAR, UNABLE TO ESTIMATE PLATELET CLUMPS NOTED ON SMEAR, UNABLE TO ESTIMATE  --  169 201   < > = values in this interval not displayed.   Basic Metabolic Panel: Recent Labs  Lab 08/21/20 0116 08/21/20 0116 08/22/20 0607 08/22/20 2204 08/23/20 1858 08/23/20 1858 08/24/20 0250 08/24/20 0250 08/24/20 0253 08/25/20 0048 08/26/20 0129 08/27/20 0031 08/27/20 0828  NA 134*   < > 130*   < > 136   < > 137  --  139 141 142 139  --   K 4.9   < > 5.0   < > 4.4   < > 4.2   < > 4.2 4.7 5.0 5.2* 4.9  CL 99   < > 94*   < > 101  --  103  --   --  107 109 107  --   CO2 24   < > 25   < > 22  --  21*  --   --  23 25 26   --   GLUCOSE 133*   < > 125*   < > 144*  --  132*  --   --  122* 131* 109*  --   BUN 27*   < > 31*   < > 71*  --  73*  --   --  64* 46* 36*  --   CREATININE 1.30*   < > 1.16   < > 2.60*  --  2.16*  --   --  1.72* 1.33* 1.10  --   CALCIUM 8.5*   < > 8.3*   < > 7.3*  --  7.4*  --   --  8.0* 8.1* 8.6*  --   MG 2.4  --  2.5*  --  2.7*  --  2.8*  --   --   --   --   --   --    < > = values in this interval not displayed.   GFR: Estimated Creatinine Clearance: 82.3 mL/min (by C-G formula based on SCr of 1.1 mg/dL). Liver Function Tests: Recent Labs  Lab 08/23/20 0011  08/25/20 0048 08/26/20 0129 08/27/20 0031  AST 1,640* 1,609* 420* 175*  ALT 1,881* 3,848* 2,222* 1,551*  ALKPHOS 78 102 112 124  BILITOT 1.0 1.3* 1.2 0.9  PROT 4.6* 5.9* 5.5* 5.8*  ALBUMIN 1.6* 2.6* 2.4* 2.5*   No results for input(s): LIPASE, AMYLASE in the last 168 hours. No results for input(s): AMMONIA in the last 168 hours. Coagulation Profile: Recent Labs  Lab 08/23/20 1858 08/24/20 0250 08/25/20 0048 08/26/20 0129 08/27/20 0031  INR 1.8* 1.6* 1.4* 1.3* 1.2   Cardiac Enzymes: No results for input(s): CKTOTAL, CKMB, CKMBINDEX, TROPONINI in the last 168 hours. BNP (last 3 results) No results for input(s): PROBNP in the last 8760 hours. HbA1C: No results for input(s): HGBA1C in the last 72 hours. CBG: Recent Labs  Lab 08/26/20 2016 08/26/20 2345 08/27/20 0345 08/27/20 0753 08/27/20 1210  GLUCAP 151* 96 96 81 146*   Lipid Profile: No results for input(s): CHOL, HDL, LDLCALC, TRIG, CHOLHDL, LDLDIRECT in the last 72 hours. Thyroid Function Tests: No results for input(s): TSH, T4TOTAL, FREET4, T3FREE, THYROIDAB in the last 72 hours. Anemia Panel: No results for input(s): VITAMINB12, FOLATE, FERRITIN, TIBC, IRON, RETICCTPCT in the last 72 hours. Sepsis Labs: Recent Labs  Lab 08/21/20 0755 08/22/20 2102 08/23/20 0011 08/23/20 1858 08/24/20 0250  PROCALCITON 0.21  --  0.97 2.44 2.13  LATICACIDVEN  --  >11.0* >11.0* 2.5*  --     Recent Results (from the past 240 hour(s))  Culture, blood (routine x 2)     Status: None (Preliminary result)   Collection Time: 08/23/20  6:08 AM   Specimen: BLOOD RIGHT ARM  Result Value Ref Range Status   Specimen Description BLOOD RIGHT ARM  Final   Special Requests   Final    BOTTLES DRAWN AEROBIC AND ANAEROBIC Blood Culture adequate volume   Culture   Final    NO GROWTH 4 DAYS Performed at [facility], [address], [place], [place] [address]    Report Status PENDING  Incomplete     Radiology  Studies: ECHOCARDIOGRAM LIMITED  Result Date: 08/26/2020    ECHOCARDIOGRAM LIMITED REPORT   Patient Name:   Henry May Date of Exam: 08/26/2020 Medical Rec #:  [id number]      Height:       72.0 in Accession #:  [id number]     Weight:       201.5 lb Date of Birth:  1966-07-15      BSA:          2.137 m Patient Age:    54 years       BP:           124/82 mmHg Patient Gender: M              HR:           87 bpm. Exam Location:  Inpatient Procedure: Limited Echo, Limited Color Doppler, Cardiac Doppler and 3D Echo Indications:    Cardiac arrest I46.9  History:        Patient has prior history of Echocardiogram examinations, most                 recent 08/07/2020. Admited with COVID 19 on 8/16. Essential                 hypertension, QT prolongation, Sinus tachycardia, Acute DVT,                 Acute kidney injury.  Sonographer:    Leta Junglingiffany Cooper RDCS Referring Phys: [id number] NICOLE GONZALES IMPRESSIONS  1. Left ventricular ejection fraction, by estimation, is 60 to 65%. The left ventricle has normal function. The left ventricle has no regional wall motion abnormalities. The left ventricular internal cavity size was mildly dilated. Left ventricular diastolic parameters are consistent with Grade II diastolic dysfunction (pseudonormalization). LV longitudinal strain was performed by not accurated due to inappropriate tracking.  2. Right ventricular systolic function is normal. The right ventricular size is normal.  3. The mitral valve is normal in structure. Mild mitral valve regurgitation. No evidence of mitral stenosis.  4. The aortic valve is normal in structure. Aortic valve regurgitation is not visualized. No aortic stenosis is present.  5. Aortic dilatation noted. There is borderline dilatation of the aortic root, measuring 37 mm.  6. The inferior vena cava is normal in size with greater than 50% respiratory variability, suggesting right atrial pressure of 3 mmHg. FINDINGS  Left Ventricle: Left ventricular  ejection fraction, by estimation, is 60 to 65%. The left ventricle has normal function. The left ventricle has no regional wall motion abnormalities. Global longitudinal strain performed but not reported based on interpreter judgement due to suboptimal tracking. The left ventricular internal cavity size was mildly dilated. There is no left ventricular hypertrophy. Left ventricular diastolic parameters are consistent with Grade II diastolic dysfunction (pseudonormalization). Normal left ventricular filling pressure. Right Ventricle: The right ventricular size is normal. No increase in right ventricular wall thickness. Right ventricular systolic function is normal. Left Atrium: Left atrial size was normal in size. Right Atrium: Right atrial size was normal in size. Pericardium: There is no evidence of pericardial effusion. Mitral Valve: The mitral valve is normal in structure. Mild mitral valve regurgitation. No evidence of mitral valve stenosis. Tricuspid Valve: The tricuspid valve is normal in structure. Tricuspid valve regurgitation is not demonstrated. No evidence of tricuspid stenosis. Aortic Valve: The aortic valve is normal in structure. Aortic valve regurgitation is not visualized. No aortic stenosis is present. Pulmonic Valve: The pulmonic valve was normal in structure. Pulmonic valve regurgitation is not visualized. No evidence of pulmonic stenosis. Aorta: Aortic dilatation noted. There is borderline dilatation of the aortic root, measuring 37 mm. Venous: The inferior vena cava is normal in size with greater than 50% respiratory variability, suggesting right atrial pressure of  3 mmHg. IAS/Shunts: No atrial level shunt detected by color flow Doppler. LEFT VENTRICLE PLAX 2D LVIDd:         5.90 cm      Diastology LVIDs:         4.50 cm      LV e' medial:    4.46 cm/s LV PW:         0.80 cm      LV E/e' medial:  13.5 LV IVS:        0.90 cm      LV e' lateral:   8.05 cm/s LVOT diam:     2.10 cm      LV E/e'  lateral: 7.5 LV SV:         53 LV SV Index:   25           2D Longitudinal Strain LVOT Area:     3.46 cm     2D Strain GLS (A2C):   -12.8 %                             2D Strain GLS (A3C):   -13.4 %                             2D Strain GLS (A4C):   -16.3 % LV Volumes (MOD)            2D Strain GLS Avg:     -14.2 % LV vol d, MOD A2C: 130.0 ml LV vol d, MOD A4C: 139.0 ml LV vol s, MOD A2C: 57.6 ml LV vol s, MOD A4C: 53.3 ml  3D Volume EF: LV SV MOD A2C:     72.4 ml  3D EF:        55 % LV SV MOD A4C:     139.0 ml LV EDV:       148 ml LV SV MOD BP:      81.4 ml  LV ESV:       66 ml                             LV SV:        82 ml LEFT ATRIUM         Index LA diam:    3.60 cm 1.68 cm/m  AORTIC VALVE LVOT Vmax:   94.60 cm/s LVOT Vmean:  62.500 cm/s LVOT VTI:    0.153 m  AORTA Ao Root diam: 3.70 cm MITRAL VALVE MV Area (PHT): 3.10 cm    SHUNTS MV Decel Time: 245 msec    Systemic VTI:  0.15 m MV E velocity: 60.20 cm/s  Systemic Diam: 2.10 cm MV A velocity: 58.50 cm/s MV E/A ratio:  1.03 Gloris Manchester Turner MD Electronically signed by Armanda Magic MD Signature Date/Time: 08/26/2020/2:22:12 PM    Final     Scheduled Meds: . sodium chloride   Intravenous Once  . sodium chloride   Intravenous Once  . sodium chloride   Intravenous Once  . Chlorhexidine Gluconate Cloth  6 each Topical Daily  . feeding supplement (ENSURE ENLIVE)  237 mL Oral TID BM  . insulin aspart  0-15 Units Subcutaneous Q4H  . mouth rinse  15 mL Mouth Rinse BID  . melatonin  5 mg Oral QHS  . methylPREDNISolone (SOLU-MEDROL) injection  40 mg Intravenous Daily  .  metoprolol tartrate  25 mg Oral BID  . multivitamin with minerals  1 tablet Oral Daily  . pantoprazole (PROTONIX) IV  40 mg Intravenous Q24H  . polyethylene glycol  17 g Oral BID  . senna  2 tablet Oral BID AC & HS   Continuous Infusions: . sodium chloride Stopped (08/24/20 1800)  . sodium chloride Stopped (08/24/20 1800)     LOS: 21 days    Time spent: 35 mins.    Cipriano Bunker, MD [facility]   If 7PM-7AM, please contact night-coverage

## 2020-08-28 LAB — CBC
HCT: 34 % — ABNORMAL LOW (ref 39.0–52.0)
Hemoglobin: 10.6 g/dL — ABNORMAL LOW (ref 13.0–17.0)
MCH: 28.7 pg (ref 26.0–34.0)
MCHC: 31.2 g/dL (ref 30.0–36.0)
MCV: 92.1 fL (ref 80.0–100.0)
Platelets: 202 10*3/uL (ref 150–400)
RBC: 3.69 MIL/uL — ABNORMAL LOW (ref 4.22–5.81)
RDW: 17.9 % — ABNORMAL HIGH (ref 11.5–15.5)
WBC: 13.4 10*3/uL — ABNORMAL HIGH (ref 4.0–10.5)
nRBC: 1.9 % — ABNORMAL HIGH (ref 0.0–0.2)

## 2020-08-28 LAB — COMPREHENSIVE METABOLIC PANEL
ALT: 1018 U/L — ABNORMAL HIGH (ref 0–44)
AST: 93 U/L — ABNORMAL HIGH (ref 15–41)
Albumin: 2.5 g/dL — ABNORMAL LOW (ref 3.5–5.0)
Alkaline Phosphatase: 118 U/L (ref 38–126)
Anion gap: 9 (ref 5–15)
BUN: 34 mg/dL — ABNORMAL HIGH (ref 6–20)
CO2: 26 mmol/L (ref 22–32)
Calcium: 8.6 mg/dL — ABNORMAL LOW (ref 8.9–10.3)
Chloride: 103 mmol/L (ref 98–111)
Creatinine, Ser: 1.09 mg/dL (ref 0.61–1.24)
GFR calc Af Amer: >60 mL/min (ref >60)
GFR calc non Af Amer: >60 mL/min (ref >60)
Glucose, Bld: 115 mg/dL — ABNORMAL HIGH (ref 70–99)
Potassium: 5.6 mmol/L — ABNORMAL HIGH (ref 3.5–5.1)
Sodium: 138 mmol/L (ref 135–145)
Total Bilirubin: 0.9 mg/dL (ref 0.3–1.2)
Total Protein: 5.9 g/dL — ABNORMAL LOW (ref 6.5–8.1)

## 2020-08-28 LAB — PROTIME-INR
INR: 1.2 (ref 0.8–1.2)
Prothrombin Time: 14.9 seconds (ref 11.4–15.2)

## 2020-08-28 LAB — GLUCOSE, CAPILLARY
Glucose-Capillary: 107 mg/dL — ABNORMAL HIGH (ref 70–99)
Glucose-Capillary: 140 mg/dL — ABNORMAL HIGH (ref 70–99)
Glucose-Capillary: 152 mg/dL — ABNORMAL HIGH (ref 70–99)
Glucose-Capillary: 154 mg/dL — ABNORMAL HIGH (ref 70–99)
Glucose-Capillary: 185 mg/dL — ABNORMAL HIGH (ref 70–99)
Glucose-Capillary: 74 mg/dL (ref 70–99)
Glucose-Capillary: 85 mg/dL (ref 70–99)

## 2020-08-28 LAB — MAGNESIUM: Magnesium: 2.1 mg/dL (ref 1.7–2.4)

## 2020-08-28 LAB — PATHOLOGIST SMEAR REVIEW

## 2020-08-28 LAB — POTASSIUM: Potassium: 5.6 mmol/L — ABNORMAL HIGH (ref 3.5–5.1)

## 2020-08-28 LAB — PHOSPHORUS: Phosphorus: 4.6 mg/dL (ref 2.5–4.6)

## 2020-08-28 MED ORDER — SODIUM ZIRCONIUM CYCLOSILICATE 5 G PO PACK
5.0000 g | PACK | Freq: Once | ORAL | Status: AC
  Administered 2020-08-28: 5 g via ORAL
  Filled 2020-08-28: qty 1

## 2020-08-28 NOTE — Progress Notes (Addendum)
PROGRESS NOTE    Izaia Say  QBH:[id number] DOB: 1966-11-09 DOA: 08/06/2020 PCP: Patient, No Pcp Per    Brief Narrative:  54 years old male with no significant past medical history presents in the ED with increasing shortness of breath.  He was diagnosed with COVID-19 on 8/12 and then has worsening shortness of breath.  He was admitted on 8/22 for acute respiratory failure secondary to Covid pneumonia requiring high flow oxygen and BiPAP.  He was transferred to the ICU for further care.  He has received treatment for Covid pneumonia.  He was transferred out of the ICU on 9/3.  He has developed lower extremity DVT, was started on heparin. Later has developed large retroperitoneal hematoma resulting in brief PEA.  Patient remained hypotensive requiring levophed,  intubated post arrest and transferred back to ICU.  He underwent IVC filter placement, anticoagulation discontinued.  He was successfully extubated 9/9, off pressors .  He is back on high flow nasal cannula at 12 L sats 94%.  still quite short of breath with activity.  He is off airborne isolation.  Assessment & Plan:   Principal Problem:   Acute respiratory failure due to COVID-19 [facility]) Active Problems:   ARF (acute renal failure) (HCC)   Elevated LFTs   Acute respiratory disease due to COVID-19 virus   Pneumonia due to COVID-19 virus   Acute hypoxemic respiratory failure due to COVID-19 [facility])   Leg DVT (deep venous thromboembolism), acute, bilateral (HCC)   Chest pain   Acute hypoxemic respiratory failure sec to COVID-19 pneumonia : He required high flow oxygen and BiPAP,  admitted in the ICU.  Successfully extubated 9/9. -He is still requiring HFNC @ 12 L, weaned down to 8L -Keep saturation above 94%.  Continue supplemental oxygen, wean as tolerated. -He has completed treatment for Covid pneumonia. -He is off airborne precautions.  Acute retroperitoneal bleed -  Stable. Has developed Large left retroperitoneal bleeding.    Required 2 units PRBC , 2 FFPS.  Hb 10.6 stable.  Hemorrhagic shock: resolved   Status post cardiac arrest, PEA lasted about 4 minutes -Hemodynamically stable  Now. -No intervention required for retroperitoneal bleed. - Required 2 units PRBC/ 2 FFP -Will continue to monitor  Thrombocytopenia - Resolved.  Leukocytosis -We will continue to monitor. -No indication for antibiotics at present but will monitor closely. - Trending down.   CHF with normal ejection fraction: -continue metoprolol.  Acute kidney injury - Resolved  -Renally dose medications -Maintain euvolemia. -Avoid nephrotoxic's  Hyperkalemia : K 5.6, Recheck K 5.6, Lokelma x 1 given,  Will recheck am labs.  Bilateral lower extremity DVT. -Post IVC filter 9/9. -Hold anticoagulation.  QT prolongation -Continue to monitor.  Sinus tachycardia:  resolved.  Essential hypertension -Continue beta-blockade.  Elevated transaminases, hyperbilirubinemia -Likely due to acute illness.  Hyperbilirubinemia may be due to degraded blood cells in RP space. - con't to monitor but anticipate that it may worsen before improving. - LFTS trending down.    DVT prophylaxis: SCDs Code Status: Full Family Communication: Wife was at bed side. Disposition Plan:  Status is: Inpatient  Remains inpatient appropriate because:Inpatient level of care appropriate due to severity of illness   Dispo: The patient is from: Home              Anticipated d/c is to: Home              Anticipated d/c date is: 3 days  Patient currently is not medically stable to d/c.   Consultants:   PCCM   Procedures: Intubated, extubated, central lines.  Antimicrobials: Anti-infectives (From admission, onward)   Start     Dose/Rate Route Frequency Ordered Stop   08/24/20 2200  ceFEPIme (MAXIPIME) 2 g in sodium chloride 0.9 % 100 mL IVPB  Status:  Discontinued        2 g 200 mL/hr over 30 Minutes Intravenous Every 12  hours 08/24/20 0813 08/24/20 0828   08/24/20 0900  vancomycin (VANCOREADY) IVPB 1750 mg/350 mL  Status:  Discontinued        1,750 mg 175 mL/hr over 120 Minutes Intravenous  Once 08/24/20 0813 08/24/20 0828   08/23/20 0400  ceFEPIme (MAXIPIME) 2 g in sodium chloride 0.9 % 100 mL IVPB  Status:  Discontinued        2 g 200 mL/hr over 30 Minutes Intravenous Every 24 hours 08/23/20 0256 08/24/20 0813   08/23/20 0400  vancomycin (VANCOREADY) IVPB 1750 mg/350 mL        1,750 mg 175 mL/hr over 120 Minutes Intravenous  Once 08/23/20 0256 08/23/20 0740   08/23/20 0345  metroNIDAZOLE (FLAGYL) IVPB 500 mg  Status:  Discontinued        500 mg 100 mL/hr over 60 Minutes Intravenous Every 8 hours 08/23/20 0249 08/24/20 0828   08/23/20 0256  vancomycin variable dose per unstable renal function (pharmacist dosing)  Status:  Discontinued         Does not apply See admin instructions 08/23/20 0256 08/24/20 0828   08/09/20 0600  vancomycin (VANCOREADY) IVPB 750 mg/150 mL  Status:  Discontinued        750 mg 150 mL/hr over 60 Minutes Intravenous Every 12 hours 08/08/20 1342 08/09/20 1153   08/08/20 0600  vancomycin (VANCOREADY) IVPB 1250 mg/250 mL  Status:  Discontinued        1,250 mg 166.7 mL/hr over 90 Minutes Intravenous Every 24 hours 08/07/20 0602 08/08/20 1342   08/08/20 0000  cefTRIAXone (ROCEPHIN) 2 g in sodium chloride 0.9 % 100 mL IVPB  Status:  Discontinued        2 g 200 mL/hr over 30 Minutes Intravenous Every 24 hours 08/07/20 0119 08/07/20 0535   08/07/20 1000  remdesivir 100 mg in sodium chloride 0.9 % 100 mL IVPB       "Followed by" Linked Group Details   100 mg 200 mL/hr over 30 Minutes Intravenous Every 24 hours 08/06/20 1931 08/10/20 1130   08/07/20 0630  ceFEPIme (MAXIPIME) 2 g in sodium chloride 0.9 % 100 mL IVPB        2 g 200 mL/hr over 30 Minutes Intravenous Every 8 hours 08/07/20 0602 08/13/20 2155   08/07/20 0630  vancomycin (VANCOREADY) IVPB 2000 mg/400 mL        2,000 mg 200  mL/hr over 120 Minutes Intravenous  Once 08/07/20 0602 08/07/20 0846   08/07/20 0200  azithromycin (ZITHROMAX) 500 mg in sodium chloride 0.9 % 250 mL IVPB  Status:  Discontinued        500 mg 250 mL/hr over 60 Minutes Intravenous Every 24 hours 08/07/20 0119 08/07/20 0535   08/07/20 0130  cefTRIAXone (ROCEPHIN) 2 g in sodium chloride 0.9 % 100 mL IVPB        2 g 200 mL/hr over 30 Minutes Intravenous  Once 08/07/20 0124 08/07/20 0251   08/06/20 2000  remdesivir 200 mg in sodium chloride 0.9% 250 mL IVPB       "  Followed by" Linked Group Details   200 mg 580 mL/hr over 30 Minutes Intravenous Once 08/06/20 1931 08/07/20 0308      Subjective: Patient was seen and examined at bedside.  Overnight events noted.  He is  still tachypneic and short of breath while talking.   His O2 saturation is 94% on 8 L high flow nasal cannula.  He reports slowly improving, been using spirometer more often.  Objective: Vitals:   08/28/20 0812 08/28/20 0843 08/28/20 1112 08/28/20 1232  BP: 118/77   128/79  Pulse: 85  82 84  Resp: (!) 28  20 (!) 26  Temp: 97.9 F (36.6 C)   98.3 F (36.8 C)  TempSrc:      SpO2: 92% 92% 95%   Weight:      Height:        Intake/Output Summary (Last 24 hours) at 08/28/2020 1515 Last data filed at 08/28/2020 1405 Gross per 24 hour  Intake --  Output 1090 ml  Net -1090 ml   Filed Weights   08/25/20 0500 08/26/20 0600 08/27/20 0347  Weight: 92.4 kg 91.4 kg 91.2 kg    Examination:  General exam: Appears short of breath while talking , Increased resp.rate 24-26 Respiratory system: Clear to auscultation. Slightly increased work of breathing  Cardiovascular system: S1 & S2 heard, RRR. No JVD, murmurs, rubs, gallops or clicks. No pedal edema. Gastrointestinal system: Abdomen is nondistended, soft and nontender. No organomegaly or masses felt. Normal bowel sounds heard. Central nervous system: Alert and oriented. No focal neurological deficits. Extremities: Left leg  slightly swollen, no tenderness. Skin: No rashes, lesions or ulcers Psychiatry: Judgement and insight appear normal. Mood & affect appropriate.     Data Reviewed: I have personally reviewed following labs and imaging studies  CBC: Recent Labs  Lab 08/23/20 0011 08/23/20 0335 08/23/20 1858 08/23/20 1858 08/24/20 0250 08/24/20 0253 08/26/20 0129 08/27/20 0031 08/28/20 0512  WBC 22.9*   < > 17.1*  --  20.1*  --  17.6* 17.1* 13.4*  NEUTROABS 17.8*  --   --   --   --   --   --   --   --   HGB 5.9*  6.1*   < > 10.2*   < > 10.3* 10.2* 10.1* 10.4* 10.6*  HCT 18.4*  18.0*   < > 29.6*   < > 29.5* 30.0* 31.8* 32.5* 34.0*  MCV 98.4   < > 83.6  --  83.8  --  90.3 90.5 92.1  PLT 245   < > PLATELET CLUMPS NOTED ON SMEAR, UNABLE TO ESTIMATE  --  PLATELET CLUMPS NOTED ON SMEAR, UNABLE TO ESTIMATE  --  169 201 202   < > = values in this interval not displayed.   Basic Metabolic Panel: Recent Labs  Lab 08/22/20 0607 08/22/20 2204 08/23/20 1858 08/23/20 1858 08/24/20 0250 08/24/20 0250 08/24/20 0253 08/24/20 0253 08/25/20 0048 08/25/20 0048 08/26/20 0129 08/27/20 0031 08/27/20 0828 08/28/20 0512 08/28/20 0930  NA 130*   < > 136   < > 137   < > 139  --  141  --  142 139  --  138  --   K 5.0   < > 4.4   < > 4.2   < > 4.2   < > 4.7   < > 5.0 5.2* 4.9 5.6* 5.6*  CL 94*   < > 101   < > 103  --   --   --  107  --  109 107  --  103  --   CO2 25   < > 22   < > 21*  --   --   --  23  --  25 26  --  26  --   GLUCOSE 125*   < > 144*   < > 132*  --   --   --  122*  --  131* 109*  --  115*  --   BUN 31*   < > 71*   < > 73*  --   --   --  64*  --  46* 36*  --  34*  --   CREATININE 1.16   < > 2.60*   < > 2.16*  --   --   --  1.72*  --  1.33* 1.10  --  1.09  --   CALCIUM 8.3*   < > 7.3*   < > 7.4*  --   --   --  8.0*  --  8.1* 8.6*  --  8.6*  --   MG 2.5*  --  2.7*  --  2.8*  --   --   --   --   --   --   --   --  2.1  --   PHOS  --   --   --   --   --   --   --   --   --   --   --   --   --   4.6  --    < > = values in this interval not displayed.   GFR: Estimated Creatinine Clearance: 83.1 mL/min (by C-G formula based on SCr of 1.09 mg/dL). Liver Function Tests: Recent Labs  Lab 08/23/20 0011 08/25/20 0048 08/26/20 0129 08/27/20 0031 08/28/20 0512  AST 1,640* 1,609* 420* 175* 93*  ALT 1,881* 3,848* 2,222* 1,551* 1,018*  ALKPHOS 78 102 112 124 118  BILITOT 1.0 1.3* 1.2 0.9 0.9  PROT 4.6* 5.9* 5.5* 5.8* 5.9*  ALBUMIN 1.6* 2.6* 2.4* 2.5* 2.5*   No results for input(s): LIPASE, AMYLASE in the last 168 hours. No results for input(s): AMMONIA in the last 168 hours. Coagulation Profile: Recent Labs  Lab 08/24/20 0250 08/25/20 0048 08/26/20 0129 08/27/20 0031 08/28/20 0512  INR 1.6* 1.4* 1.3* 1.2 1.2   Cardiac Enzymes: No results for input(s): CKTOTAL, CKMB, CKMBINDEX, TROPONINI in the last 168 hours. BNP (last 3 results) No results for input(s): PROBNP in the last 8760 hours. HbA1C: No results for input(s): HGBA1C in the last 72 hours. CBG: Recent Labs  Lab 08/27/20 2351 08/28/20 0442 08/28/20 0806 08/28/20 1027 08/28/20 1234  GLUCAP 154* 85 74 185* 152*   Lipid Profile: No results for input(s): CHOL, HDL, LDLCALC, TRIG, CHOLHDL, LDLDIRECT in the last 72 hours. Thyroid Function Tests: No results for input(s): TSH, T4TOTAL, FREET4, T3FREE, THYROIDAB in the last 72 hours. Anemia Panel: No results for input(s): VITAMINB12, FOLATE, FERRITIN, TIBC, IRON, RETICCTPCT in the last 72 hours. Sepsis Labs: Recent Labs  Lab 08/22/20 2102 08/23/20 0011 08/23/20 1858 08/24/20 0250  PROCALCITON  --  0.97 2.44 2.13  LATICACIDVEN >11.0* >11.0* 2.5*  --     Recent Results (from the past 240 hour(s))  Culture, blood (routine x 2)     Status: None (Preliminary result)   Collection Time: 08/23/20  6:08 AM   Specimen: BLOOD RIGHT ARM  Result Value Ref Range Status   Specimen  Description BLOOD RIGHT ARM  Final   Special Requests   Final    BOTTLES DRAWN AEROBIC  AND ANAEROBIC Blood Culture adequate volume   Culture   Final    NO GROWTH 4 DAYS Performed at [facility], [address] [place], [place] [address]    Report Status PENDING  Incomplete     Radiology Studies: No results found.  Scheduled Meds: . sodium chloride   Intravenous Once  . sodium chloride   Intravenous Once  . sodium chloride   Intravenous Once  . Chlorhexidine Gluconate Cloth  6 each Topical Daily  . feeding supplement (ENSURE ENLIVE)  237 mL Oral TID BM  . insulin aspart  0-15 Units Subcutaneous Q4H  . mouth rinse  15 mL Mouth Rinse BID  . melatonin  5 mg Oral QHS  . methylPREDNISolone (SOLU-MEDROL) injection  40 mg Intravenous Daily  . metoprolol tartrate  25 mg Oral BID  . multivitamin with minerals  1 tablet Oral Daily  . pantoprazole (PROTONIX) IV  40 mg Intravenous Q24H  . polyethylene glycol  17 g Oral BID  . senna  2 tablet Oral BID AC & HS   Continuous Infusions: . sodium chloride Stopped (08/24/20 1800)  . sodium chloride Stopped (08/24/20 1800)     LOS: 22 days    Time spent: 35 mins.    Cipriano BunkerPARDEEP Maylie Ashton, MD [facility]   If 7PM-7AM, please contact night-coverage

## 2020-08-28 NOTE — Progress Notes (Addendum)
Physical Therapy Treatment Patient Details Name: Henry May MRN: [id number] DOB: 1966-05-04 Today's Date: 08/28/2020    History of Present Illness 54 yo unvaccinated male admitted with hypoxia and WOB due to Covid 19 on 8/22 with initial diagnosis 8/12.  PEA arrest in hospital on 9/7 resulting in intubation.  Pt VDRF. IVC filter placed 9/9.    PT Comments    Patient progressing with activity tolerance and more quickly able to recover after each bout of mobility or exercise.  He is rigid in his upper trunk making postural activities difficult, but making progress with activities this session.  Educated pt/wife on need for weaning to 6L O2 prior to able to attempt to ambulate.  PT to continue to follow acutely.  Hopeful he will progress to tolerate CIR or to go home.   Follow Up Recommendations  Supervision/Assistance - 24 hour;CIR     Equipment Recommendations  Rolling walker with 5" wheels    Recommendations for Other Services       Precautions / Restrictions Precautions Precautions: Fall Precaution Comments: watch sats, RR Restrictions Weight Bearing Restrictions: No    Mobility  Bed Mobility               General bed mobility comments: OOB in chair  Transfers Overall transfer level: Needs assistance Equipment used: Rolling walker (2 wheeled) Transfers: Sit to/from Stand   Stand pivot transfers: Min guard       General transfer comment: performed x 3 reps with cues for posture and assist for balance  Ambulation/Gait Ambulation/Gait assistance: Min guard   Assistive device: Rolling walker (2 wheeled)       General Gait Details: marched in place x5 with UE support very limited foot clearance, SpO2 dropping to 86%   Stairs             Wheelchair Mobility    Modified Rankin (Stroke Patients Only)       Balance Overall balance assessment: Needs assistance Sitting-balance support: Feet supported Sitting balance-Leahy Scale: Fair Sitting  balance - Comments: sits edge of recliner for postural exercises   Standing balance support: Single extremity supported;Bilateral upper extremity supported Standing balance-Leahy Scale: Poor Standing balance comment: UE support for balance                            Cognition Arousal/Alertness: Awake/alert Behavior During Therapy: WFL for tasks assessed/performed   Area of Impairment: Safety/judgement;Problem solving                         Safety/Judgement: Decreased awareness of deficits   Problem Solving: Slow processing;Requires verbal cues        Exercises Other Exercises Other Exercises: LAQ w/ 5 sec hold x 10 Other Exercises: marching in sitting x 10 Other Exercises: scap squeezes with facilitation and cues x 5 Other Exercises: cervical retraction    General Comments General comments (skin integrity, edema, etc.): 12L HFNC SpO2 94% at rest, HR 90's RR 30's at rest to 20's, With activity lowest was 86%. RR 48, but recovered within 1.5 minutes after each activity      Pertinent Vitals/Pain Pain Assessment: Faces Faces Pain Scale: Hurts a little bit Pain Location: generalized Pain Descriptors / Indicators: Discomfort Pain Intervention(s): Monitored during session;Repositioned    Home Living                      Prior Function  PT Goals (current goals can now be found in the care plan section) Acute Rehab PT Goals Patient Stated Goal: to walk PT Goal Formulation: With patient Time For Goal Achievement: 09/11/20 Potential to Achieve Goals: Good Progress towards PT goals: Progressing toward goals    Frequency    Min 3X/week      PT Plan Current plan remains appropriate    Co-evaluation              AM-PAC PT "6 Clicks" Mobility   Outcome Measure  Help needed turning from your back to your side while in a flat bed without using bedrails?: A Little Help needed moving from lying on your back to sitting on  the side of a flat bed without using bedrails?: A Little Help needed moving to and from a bed to a chair (including a wheelchair)?: A Little Help needed standing up from a chair using your arms (e.g., wheelchair or bedside chair)?: A Little Help needed to walk in hospital room?: Total Help needed climbing 3-5 steps with a railing? : Total 6 Click Score: 14    End of Session Equipment Utilized During Treatment: Oxygen Activity Tolerance: Patient limited by fatigue Patient left: with call bell/phone within reach;in chair;with family/visitor present   PT Visit Diagnosis: Other abnormalities of gait and mobility (R26.89);Muscle weakness (generalized) (M62.81);Difficulty in walking, not elsewhere classified (R26.2)     Time: 4503-8882 PT Time Calculation (min) (ACUTE ONLY): 36 min  Charges:  $Therapeutic Exercise: 8-22 mins $Therapeutic Activity: 8-22 mins                     Sheran Lawless, PT Acute Rehabilitation Services [phone] Office:[phone] 08/28/2020    Henry May 08/28/2020, 6:18 PM

## 2020-08-29 LAB — CBC
HCT: 34.8 % — ABNORMAL LOW (ref 39.0–52.0)
Hemoglobin: 10.8 g/dL — ABNORMAL LOW (ref 13.0–17.0)
MCH: 28.2 pg (ref 26.0–34.0)
MCHC: 31 g/dL (ref 30.0–36.0)
MCV: 90.9 fL (ref 80.0–100.0)
Platelets: 247 10*3/uL (ref 150–400)
RBC: 3.83 MIL/uL — ABNORMAL LOW (ref 4.22–5.81)
RDW: 17.3 % — ABNORMAL HIGH (ref 11.5–15.5)
WBC: 12 10*3/uL — ABNORMAL HIGH (ref 4.0–10.5)
nRBC: 0.5 % — ABNORMAL HIGH (ref 0.0–0.2)

## 2020-08-29 LAB — GLUCOSE, CAPILLARY
Glucose-Capillary: 73 mg/dL (ref 70–99)
Glucose-Capillary: 68 mg/dL — ABNORMAL LOW (ref 70–99)
Glucose-Capillary: 127 mg/dL — ABNORMAL HIGH (ref 70–99)
Glucose-Capillary: 190 mg/dL — ABNORMAL HIGH (ref 70–99)
Glucose-Capillary: 116 mg/dL — ABNORMAL HIGH (ref 70–99)
Glucose-Capillary: 189 mg/dL — ABNORMAL HIGH (ref 70–99)
Glucose-Capillary: 100 mg/dL — ABNORMAL HIGH (ref 70–99)

## 2020-08-29 LAB — COMPREHENSIVE METABOLIC PANEL
ALT: 692 U/L — ABNORMAL HIGH (ref 0–44)
AST: 70 U/L — ABNORMAL HIGH (ref 15–41)
Albumin: 2.5 g/dL — ABNORMAL LOW (ref 3.5–5.0)
Alkaline Phosphatase: 119 U/L (ref 38–126)
Anion gap: 8 (ref 5–15)
BUN: 29 mg/dL — ABNORMAL HIGH (ref 6–20)
CO2: 27 mmol/L (ref 22–32)
Calcium: 8.5 mg/dL — ABNORMAL LOW (ref 8.9–10.3)
Chloride: 97 mmol/L — ABNORMAL LOW (ref 98–111)
Creatinine, Ser: 1.02 mg/dL (ref 0.61–1.24)
GFR calc Af Amer: >60 mL/min (ref >60)
GFR calc non Af Amer: >60 mL/min (ref >60)
Glucose, Bld: 181 mg/dL — ABNORMAL HIGH (ref 70–99)
Potassium: 4.4 mmol/L (ref 3.5–5.1)
Sodium: 132 mmol/L — ABNORMAL LOW (ref 135–145)
Total Bilirubin: 1.2 mg/dL (ref 0.3–1.2)
Total Protein: 5.9 g/dL — ABNORMAL LOW (ref 6.5–8.1)

## 2020-08-29 LAB — PHOSPHORUS: Phosphorus: 3.1 mg/dL (ref 2.5–4.6)

## 2020-08-29 LAB — MAGNESIUM: Magnesium: 1.8 mg/dL (ref 1.7–2.4)

## 2020-08-29 LAB — PROTIME-INR
INR: 1.2 (ref 0.8–1.2)
Prothrombin Time: 14.6 seconds (ref 11.4–15.2)

## 2020-08-29 MED ORDER — PANTOPRAZOLE SODIUM 40 MG PO TBEC
40.0000 mg | DELAYED_RELEASE_TABLET | Freq: Every day | ORAL | Status: DC
  Administered 2020-08-30 – 2020-09-03 (×5): 40 mg via ORAL
  Filled 2020-08-29 (×5): qty 1

## 2020-08-29 NOTE — Progress Notes (Signed)
Checked on the patient  Respiratory rate in the high 20s to 30s He did appear comfortable  He is working with physical therapy  He feels he is getting better slowly  I do not believe there is any significant concerns with respect to his tachypnea which is consistent with his recovery from COVID pneumonia  Continue all supportive measures Discussed consistent, regular activities to help him get stronger  Virl Diamond, MD [place] [facility] Pager: [phone]

## 2020-08-29 NOTE — Progress Notes (Signed)
PROGRESS NOTE    Semir Brill  EHM:[id number] DOB: [birth year] DOA: 08/06/2020 PCP: Patient, No Pcp Per    Brief Narrative:  54 years old male with no significant past medical history presents in the ED with increasing shortness of breath.  He was diagnosed with COVID-19 on 8/12 and then has worsening shortness of breath.  He was admitted on 8/22 for acute respiratory failure secondary to Covid pneumonia requiring high flow oxygen and BiPAP.  He was transferred to the ICU for further care.  He has received treatment for Covid pneumonia.  He was transferred out of the ICU on 9/3.  He has developed lower extremity DVT, was started on heparin. Later has developed large retroperitoneal hematoma resulting in brief PEA.  Patient remained hypotensive requiring levophed,  intubated post arrest and transferred back to ICU.  He underwent IVC filter placement, anticoagulation discontinued.  He was successfully extubated 9/9, off pressors .  He is back on high flow nasal cannula at 10 L sats 94%.  still quite short of breath with activity.  He is off airborne isolation.  Assessment & Plan:   Principal Problem:   Acute respiratory failure due to COVID-19 [facility]) Active Problems:   ARF (acute renal failure) (HCC)   Elevated LFTs   Acute respiratory disease due to COVID-19 virus   Pneumonia due to COVID-19 virus   Acute hypoxemic respiratory failure due to COVID-19 [facility])   Leg DVT (deep venous thromboembolism), acute, bilateral (HCC)   Chest pain   Acute hypoxemic respiratory failure sec to COVID-19 pneumonia :  He required high flow oxygen and BiPAP,  admitted in the ICU.  Successfully extubated 9/9. -He is still requiring HFNC @ 12 L, weaned down to 10L -Keep saturation above 94%.  Continue supplemental oxygen, wean as tolerated. -He has completed treatment for Covid pneumonia. -He is off airborne precautions. -He still appears tachypneic, discussed with PCCM,  -   Acute retroperitoneal bleed -   Stable. Has developed Large left retroperitoneal bleeding.   Required 2 units PRBC , 2 FFPS.  Hb 10.8 stable.  Hemorrhagic shock: resolved   Status post cardiac arrest, PEA lasted about 4 minutes -Hemodynamically stable  Now. -No intervention required for retroperitoneal bleed. - Required 2 units PRBC/ 2 FFP -Will continue to monitor  Thrombocytopenia - Resolved.  Leukocytosis - Improving. -We will continue to monitor. -No indication for antibiotics at present but will monitor closely. - Trending down.   CHF with normal ejection fraction: -continue metoprolol.  Acute kidney injury - Resolved  -Renally dose medications -Maintain euvolemia. -Avoid nephrotoxic's  Hyperkalemia : >> Resolved. K 5.6, Recheck K 5.6, Lokelma x 1 given,  Will recheck am labs.  Bilateral lower extremity DVT. -Post IVC filter 9/9. -Hold anticoagulation.  QT prolongation -Continue to monitor.  Sinus tachycardia:  resolved.  Essential hypertension -Continue beta-blockade.  Elevated transaminases, hyperbilirubinemia -Likely due to acute illness.  Hyperbilirubinemia may be due to degraded blood cells in RP space. - con't to monitor but anticipate that it may worsen before improving. - LFTS trending down.    DVT prophylaxis: SCDs Code Status: Full Family Communication: Wife was at bed side. Disposition Plan:  Status is: Inpatient  Remains inpatient appropriate because:Inpatient level of care appropriate due to severity of illness   Dispo: The patient is from: Home              Anticipated d/c is to: Home  Anticipated d/c date is: 3 days              Patient currently is not medically stable to d/c.   Consultants:   PCCM   Procedures: Intubated, extubated, central lines.  Antimicrobials: Anti-infectives (From admission, onward)   Start     Dose/Rate Route Frequency Ordered Stop   08/24/20 2200  ceFEPIme (MAXIPIME) 2 g in sodium chloride 0.9 % 100 mL IVPB   Status:  Discontinued        2 g 200 mL/hr over 30 Minutes Intravenous Every 12 hours 08/24/20 0813 08/24/20 0828   08/24/20 0900  vancomycin (VANCOREADY) IVPB 1750 mg/350 mL  Status:  Discontinued        1,750 mg 175 mL/hr over 120 Minutes Intravenous  Once 08/24/20 0813 08/24/20 0828   08/23/20 0400  ceFEPIme (MAXIPIME) 2 g in sodium chloride 0.9 % 100 mL IVPB  Status:  Discontinued        2 g 200 mL/hr over 30 Minutes Intravenous Every 24 hours 08/23/20 0256 08/24/20 0813   08/23/20 0400  vancomycin (VANCOREADY) IVPB 1750 mg/350 mL        1,750 mg 175 mL/hr over 120 Minutes Intravenous  Once 08/23/20 0256 08/23/20 0740   08/23/20 0345  metroNIDAZOLE (FLAGYL) IVPB 500 mg  Status:  Discontinued        500 mg 100 mL/hr over 60 Minutes Intravenous Every 8 hours 08/23/20 0249 08/24/20 0828   08/23/20 0256  vancomycin variable dose per unstable renal function (pharmacist dosing)  Status:  Discontinued         Does not apply See admin instructions 08/23/20 0256 08/24/20 0828   08/09/20 0600  vancomycin (VANCOREADY) IVPB 750 mg/150 mL  Status:  Discontinued        750 mg 150 mL/hr over 60 Minutes Intravenous Every 12 hours 08/08/20 1342 08/09/20 1153   08/08/20 0600  vancomycin (VANCOREADY) IVPB 1250 mg/250 mL  Status:  Discontinued        1,250 mg 166.7 mL/hr over 90 Minutes Intravenous Every 24 hours 08/07/20 0602 08/08/20 1342   08/08/20 0000  cefTRIAXone (ROCEPHIN) 2 g in sodium chloride 0.9 % 100 mL IVPB  Status:  Discontinued        2 g 200 mL/hr over 30 Minutes Intravenous Every 24 hours 08/07/20 0119 08/07/20 0535   08/07/20 1000  remdesivir 100 mg in sodium chloride 0.9 % 100 mL IVPB       "Followed by" Linked Group Details   100 mg 200 mL/hr over 30 Minutes Intravenous Every 24 hours 08/06/20 1931 08/10/20 1130   08/07/20 0630  ceFEPIme (MAXIPIME) 2 g in sodium chloride 0.9 % 100 mL IVPB        2 g 200 mL/hr over 30 Minutes Intravenous Every 8 hours 08/07/20 0602 08/13/20 2155    08/07/20 0630  vancomycin (VANCOREADY) IVPB 2000 mg/400 mL        2,000 mg 200 mL/hr over 120 Minutes Intravenous  Once 08/07/20 0602 08/07/20 0846   08/07/20 0200  azithromycin (ZITHROMAX) 500 mg in sodium chloride 0.9 % 250 mL IVPB  Status:  Discontinued        500 mg 250 mL/hr over 60 Minutes Intravenous Every 24 hours 08/07/20 0119 08/07/20 0535   08/07/20 0130  cefTRIAXone (ROCEPHIN) 2 g in sodium chloride 0.9 % 100 mL IVPB        2 g 200 mL/hr over 30 Minutes Intravenous  Once 08/07/20 0124 08/07/20 0251  08/06/20 2000  remdesivir 200 mg in sodium chloride 0.9% 250 mL IVPB       "Followed by" Linked Group Details   200 mg 580 mL/hr over 30 Minutes Intravenous Once 08/06/20 1931 08/07/20 0308      Subjective: Patient was seen and examined at bedside.  Overnight events noted.  He is still tachypneic and short of breath while talking.   His O2 saturation is 94% on  10 L high flow nasal cannula.  He reports slowly improving, been using spirometer more often. RR 28-30, out of bed to chair, wants to go home.  Objective: Vitals:   08/29/20 0403 08/29/20 0500 08/29/20 0815 08/29/20 1556  BP: 131/89  121/74 130/86  Pulse: 75 66 81 80  Resp:  20 (!) 29 20  Temp: 97.8 F (36.6 C)  98 F (36.7 C) 97.8 F (36.6 C)  TempSrc: Oral     SpO2:  99% 90% 93%  Weight:      Height:        Intake/Output Summary (Last 24 hours) at 08/29/2020 1616 Last data filed at 08/29/2020 1100 Gross per 24 hour  Intake 1080 ml  Output 1275 ml  Net -195 ml   Filed Weights   08/25/20 0500 08/26/20 0600 08/27/20 0347  Weight: 92.4 kg 91.4 kg 91.2 kg    Examination:  General exam: Appears short of breath while talking , Increased resp.rate 28-30 Respiratory system: Clear to auscultation. Slightly increased work of breathing  Cardiovascular system: S1 & S2 heard, RRR. No JVD, murmurs, rubs, gallops or clicks. No pedal edema. Gastrointestinal system: Abdomen is nondistended, soft and nontender. No  organomegaly or masses felt. Normal bowel sounds heard. Central nervous system: Alert and oriented. No focal neurological deficits. Extremities: Left leg slightly swollen, no tenderness. Skin: No rashes, lesions or ulcers Psychiatry: Judgement and insight appear normal. Mood & affect appropriate.     Data Reviewed: I have personally reviewed following labs and imaging studies  CBC: Recent Labs  Lab 08/23/20 0011 08/23/20 0335 08/24/20 0250 08/24/20 0250 08/24/20 0253 08/26/20 0129 08/27/20 0031 08/28/20 0512 08/29/20 0902  WBC 22.9*   < > 20.1*  --   --  17.6* 17.1* 13.4* 12.0*  NEUTROABS 17.8*  --   --   --   --   --   --   --   --   HGB 5.9*  6.1*   < > 10.3*   < > 10.2* 10.1* 10.4* 10.6* 10.8*  HCT 18.4*  18.0*   < > 29.5*   < > 30.0* 31.8* 32.5* 34.0* 34.8*  MCV 98.4   < > 83.8  --   --  90.3 90.5 92.1 90.9  PLT 245   < > PLATELET CLUMPS NOTED ON SMEAR, UNABLE TO ESTIMATE  --   --  169 201 202 247   < > = values in this interval not displayed.   Basic Metabolic Panel: Recent Labs  Lab 08/23/20 1858 08/23/20 1858 08/24/20 0250 08/24/20 0253 08/25/20 0048 08/25/20 0048 08/26/20 0129 08/26/20 0129 08/27/20 0031 08/27/20 0828 08/28/20 0512 08/28/20 0930 08/29/20 0902  NA 136   < > 137   < > 141  --  142  --  139  --  138  --  132*  K 4.4   < > 4.2   < > 4.7   < > 5.0   < > 5.2* 4.9 5.6* 5.6* 4.4  CL 101   < > 103   < >  107  --  109  --  107  --  103  --  97*  CO2 22   < > 21*   < > 23  --  25  --  26  --  26  --  27  GLUCOSE 144*   < > 132*   < > 122*  --  131*  --  109*  --  115*  --  181*  BUN 71*   < > 73*   < > 64*  --  46*  --  36*  --  34*  --  29*  CREATININE 2.60*   < > 2.16*   < > 1.72*  --  1.33*  --  1.10  --  1.09  --  1.02  CALCIUM 7.3*   < > 7.4*   < > 8.0*  --  8.1*  --  8.6*  --  8.6*  --  8.5*  MG 2.7*  --  2.8*  --   --   --   --   --   --   --  2.1  --  1.8  PHOS  --   --   --   --   --   --   --   --   --   --  4.6  --  3.1   < > = values  in this interval not displayed.   GFR: Estimated Creatinine Clearance: 88.8 mL/min (by C-G formula based on SCr of 1.02 mg/dL). Liver Function Tests: Recent Labs  Lab 08/25/20 0048 08/26/20 0129 08/27/20 0031 08/28/20 0512 08/29/20 0902  AST 1,609* 420* 175* 93* 70*  ALT 3,848* 2,222* 1,551* 1,018* 692*  ALKPHOS 102 112 124 118 119  BILITOT 1.3* 1.2 0.9 0.9 1.2  PROT 5.9* 5.5* 5.8* 5.9* 5.9*  ALBUMIN 2.6* 2.4* 2.5* 2.5* 2.5*   No results for input(s): LIPASE, AMYLASE in the last 168 hours. No results for input(s): AMMONIA in the last 168 hours. Coagulation Profile: Recent Labs  Lab 08/25/20 0048 08/26/20 0129 08/27/20 0031 08/28/20 0512 08/29/20 0902  INR 1.4* 1.3* 1.2 1.2 1.2   Cardiac Enzymes: No results for input(s): CKTOTAL, CKMB, CKMBINDEX, TROPONINI in the last 168 hours. BNP (last 3 results) No results for input(s): PROBNP in the last 8760 hours. HbA1C: No results for input(s): HGBA1C in the last 72 hours. CBG: Recent Labs  Lab 08/29/20 0401 08/29/20 0810 08/29/20 0838 08/29/20 1151 08/29/20 1553  GLUCAP 73 68* 127* 190* 116*   Lipid Profile: No results for input(s): CHOL, HDL, LDLCALC, TRIG, CHOLHDL, LDLDIRECT in the last 72 hours. Thyroid Function Tests: No results for input(s): TSH, T4TOTAL, FREET4, T3FREE, THYROIDAB in the last 72 hours. Anemia Panel: No results for input(s): VITAMINB12, FOLATE, FERRITIN, TIBC, IRON, RETICCTPCT in the last 72 hours. Sepsis Labs: Recent Labs  Lab 08/22/20 2102 08/23/20 0011 08/23/20 1858 08/24/20 0250  PROCALCITON  --  0.97 2.44 2.13  LATICACIDVEN >11.0* >11.0* 2.5*  --     Recent Results (from the past 240 hour(s))  Culture, blood (routine x 2)     Status: None (Preliminary result)   Collection Time: 08/23/20  6:08 AM   Specimen: BLOOD RIGHT ARM  Result Value Ref Range Status   Specimen Description BLOOD RIGHT ARM  Final   Special Requests   Final    BOTTLES DRAWN AEROBIC AND ANAEROBIC Blood Culture  adequate volume   Culture   Final    NO GROWTH 4 DAYS Performed  at [facility], [address] [place], [place] [address]    Report Status PENDING  Incomplete     Radiology Studies: No results found.  Scheduled Meds: . sodium chloride   Intravenous Once  . sodium chloride   Intravenous Once  . sodium chloride   Intravenous Once  . Chlorhexidine Gluconate Cloth  6 each Topical Daily  . feeding supplement (ENSURE ENLIVE)  237 mL Oral TID BM  . insulin aspart  0-15 Units Subcutaneous Q4H  . mouth rinse  15 mL Mouth Rinse BID  . melatonin  5 mg Oral QHS  . methylPREDNISolone (SOLU-MEDROL) injection  40 mg Intravenous Daily  . metoprolol tartrate  25 mg Oral BID  . multivitamin with minerals  1 tablet Oral Daily  . [START ON 08/30/2020] pantoprazole  40 mg Oral QHS  . polyethylene glycol  17 g Oral BID  . senna  2 tablet Oral BID AC & HS   Continuous Infusions: . sodium chloride Stopped (08/24/20 1800)  . sodium chloride Stopped (08/24/20 1800)     LOS: 23 days    Time spent: 35 mins.    Cipriano BunkerPARDEEP Sanvi Ehler, MD [facility]   If 7PM-7AM, please contact night-coverage

## 2020-08-30 LAB — GLUCOSE, CAPILLARY
Glucose-Capillary: 78 mg/dL (ref 70–99)
Glucose-Capillary: 71 mg/dL (ref 70–99)
Glucose-Capillary: 104 mg/dL — ABNORMAL HIGH (ref 70–99)
Glucose-Capillary: 139 mg/dL — ABNORMAL HIGH (ref 70–99)

## 2020-08-30 LAB — PROTIME-INR
INR: 1.1 (ref 0.8–1.2)
Prothrombin Time: 14.2 seconds (ref 11.4–15.2)

## 2020-08-30 LAB — BASIC METABOLIC PANEL
Anion gap: 7 (ref 5–15)
BUN: 26 mg/dL — ABNORMAL HIGH (ref 6–20)
CO2: 30 mmol/L (ref 22–32)
Calcium: 8.7 mg/dL — ABNORMAL LOW (ref 8.9–10.3)
Chloride: 98 mmol/L (ref 98–111)
Creatinine, Ser: 0.98 mg/dL (ref 0.61–1.24)
GFR calc Af Amer: >60 mL/min (ref >60)
GFR calc non Af Amer: >60 mL/min (ref >60)
Glucose, Bld: 78 mg/dL (ref 70–99)
Potassium: 4.8 mmol/L (ref 3.5–5.1)
Sodium: 135 mmol/L (ref 135–145)

## 2020-08-30 LAB — CBC
HCT: 34.3 % — ABNORMAL LOW (ref 39.0–52.0)
Hemoglobin: 10.8 g/dL — ABNORMAL LOW (ref 13.0–17.0)
MCH: 28.4 pg (ref 26.0–34.0)
MCHC: 31.5 g/dL (ref 30.0–36.0)
MCV: 90.3 fL (ref 80.0–100.0)
Platelets: 262 10*3/uL (ref 150–400)
RBC: 3.8 MIL/uL — ABNORMAL LOW (ref 4.22–5.81)
RDW: 17.1 % — ABNORMAL HIGH (ref 11.5–15.5)
WBC: 10.8 10*3/uL — ABNORMAL HIGH (ref 4.0–10.5)
nRBC: 0.2 % (ref 0.0–0.2)

## 2020-08-30 LAB — CULTURE, BLOOD (ROUTINE X 2)
Culture: NO GROWTH
Special Requests: ADEQUATE

## 2020-08-30 MED ORDER — METHYLPREDNISOLONE SODIUM SUCC 40 MG IJ SOLR
20.0000 mg | Freq: Every day | INTRAMUSCULAR | Status: DC
  Administered 2020-08-31 – 2020-09-01 (×2): 20 mg via INTRAVENOUS
  Filled 2020-08-30 (×2): qty 1

## 2020-08-30 NOTE — Progress Notes (Signed)
PROGRESS NOTE    Henry May  ZGY:[id number] DOB: [birth year] DOA: 08/06/2020 PCP: Patient, No Pcp Per   Brief Narrative:  54/M with no significant past medical history presented in the ED with increasing shortness of breath.  He was diagnosed with COVID-19 on 8/12 and then has worsening shortness of breath.  He was admitted on 8/22 for acute respiratory failure secondary to Covid pneumonia requiring high flow oxygen and BiPAP.  He was transferred to the ICU for further care.  He has received treatment for Covid pneumonia.  He was transferred out of the ICU on 9/3.  He has developed lower extremity DVT, was started on heparin. Later has developed large retroperitoneal hematoma resulting in brief PEA.  Patient remained hypotensive requiring levophed,  intubated post arrest and transferred back to ICU.  He underwent IVC filter placement, anticoagulation discontinued.  He was successfully extubated 9/9, off pressors .  He is back on high flow nasal cannula at 10 L sats 94%.  still quite short of breath with activity.  He is off airborne isolation.  Assessment & Plan:  Acute hypoxemic respiratory failure/COVID-19 pneumonia :  He required high flow oxygen and BiPAP,  admitted in the ICU.  Successfully extubated 9/9. -He is still requiring HFNC @ 10L -Keep saturation above 94%.  Continue supplemental oxygen, wean as tolerated. -He has completed treatment for Covid pneumonia, including remdesivir, baricitinib and has been on a prolonged course of IV Solu-Medrol, will taper -Out of bed, encouraged proning -Incentive spirometry -Wean O2 as tolerated  PEA arrest Hemorrhagic shock: resolved   Status post cardiac arrest, PEA lasted about 4 minutes, secondary to hemorrhagic shock from a massive retroperitoneal bleed -Required 2 units PRBC/ 2 FFP -Was transferred to the ICU and transferred out subsequently  Acute retroperitoneal bleed  Has developed massive left retroperitoneal bleed 13 x 11  cm Required 2 units PRBC , 2 FFPS.  -Hemoglobin stable, vitals have stabilized as well  Thrombocytopenia - Resolved.  Leukocytosis - Improving. -Continue to monitor  CHF with normal ejection fraction: -continue metoprolol.  Acute kidney injury - Resolved  -Renally dose medications -Maintain euvolemia. -Avoid nephrotoxic's  Hyperkalemia : >> Resolved. -Treated with Lokelma last week  Bilateral lower extremity DVT. -Post IVC filter 9/9. -Hold anticoagulation.  QT prolongation -Continue to monitor.  Sinus tachycardia:  resolved.  Essential hypertension -Continue beta-blockade.  Elevated transaminases, hyperbilirubinemia -Likely due to acute illness.  Hyperbilirubinemia may be due to degraded blood cells in RP space. - con't to monitor but anticipate that it may worsen before improving. - LFTS trending down.  DVT prophylaxis: SCDs Code Status: Full Family Communication: Wife was at bed side. Disposition Plan:  Status is: Inpatient  Remains inpatient appropriate because:Inpatient level of care appropriate due to severity of illness   Dispo: The patient is from: Home              Anticipated d/c is to: Home              Anticipated d/c date is: Pending improvement in dyspnea and oxygen requirement              Patient currently is not medically stable to d/c.   Consultants:   PCCM   Procedures: Intubated, extubated, central lines.  Antimicrobials: Anti-infectives (From admission, onward)   Start     Dose/Rate Route Frequency Ordered Stop   08/24/20 2200  ceFEPIme (MAXIPIME) 2 g in sodium chloride 0.9 % 100 mL IVPB  Status:  Discontinued  2 g 200 mL/hr over 30 Minutes Intravenous Every 12 hours 08/24/20 0813 08/24/20 0828   08/24/20 0900  vancomycin (VANCOREADY) IVPB 1750 mg/350 mL  Status:  Discontinued        1,750 mg 175 mL/hr over 120 Minutes Intravenous  Once 08/24/20 0813 08/24/20 0828   08/23/20 0400  ceFEPIme (MAXIPIME) 2 g in sodium  chloride 0.9 % 100 mL IVPB  Status:  Discontinued        2 g 200 mL/hr over 30 Minutes Intravenous Every 24 hours 08/23/20 0256 08/24/20 0813   08/23/20 0400  vancomycin (VANCOREADY) IVPB 1750 mg/350 mL        1,750 mg 175 mL/hr over 120 Minutes Intravenous  Once 08/23/20 0256 08/23/20 0740   08/23/20 0345  metroNIDAZOLE (FLAGYL) IVPB 500 mg  Status:  Discontinued        500 mg 100 mL/hr over 60 Minutes Intravenous Every 8 hours 08/23/20 0249 08/24/20 0828   08/23/20 0256  vancomycin variable dose per unstable renal function (pharmacist dosing)  Status:  Discontinued         Does not apply See admin instructions 08/23/20 0256 08/24/20 0828   08/09/20 0600  vancomycin (VANCOREADY) IVPB 750 mg/150 mL  Status:  Discontinued        750 mg 150 mL/hr over 60 Minutes Intravenous Every 12 hours 08/08/20 1342 08/09/20 1153   08/08/20 0600  vancomycin (VANCOREADY) IVPB 1250 mg/250 mL  Status:  Discontinued        1,250 mg 166.7 mL/hr over 90 Minutes Intravenous Every 24 hours 08/07/20 0602 08/08/20 1342   08/08/20 0000  cefTRIAXone (ROCEPHIN) 2 g in sodium chloride 0.9 % 100 mL IVPB  Status:  Discontinued        2 g 200 mL/hr over 30 Minutes Intravenous Every 24 hours 08/07/20 0119 08/07/20 0535   08/07/20 1000  remdesivir 100 mg in sodium chloride 0.9 % 100 mL IVPB       "Followed by" Linked Group Details   100 mg 200 mL/hr over 30 Minutes Intravenous Every 24 hours 08/06/20 1931 08/10/20 1130   08/07/20 0630  ceFEPIme (MAXIPIME) 2 g in sodium chloride 0.9 % 100 mL IVPB        2 g 200 mL/hr over 30 Minutes Intravenous Every 8 hours 08/07/20 0602 08/13/20 2155   08/07/20 0630  vancomycin (VANCOREADY) IVPB 2000 mg/400 mL        2,000 mg 200 mL/hr over 120 Minutes Intravenous  Once 08/07/20 0602 08/07/20 0846   08/07/20 0200  azithromycin (ZITHROMAX) 500 mg in sodium chloride 0.9 % 250 mL IVPB  Status:  Discontinued        500 mg 250 mL/hr over 60 Minutes Intravenous Every 24 hours 08/07/20 0119  08/07/20 0535   08/07/20 0130  cefTRIAXone (ROCEPHIN) 2 g in sodium chloride 0.9 % 100 mL IVPB        2 g 200 mL/hr over 30 Minutes Intravenous  Once 08/07/20 0124 08/07/20 0251   08/06/20 2000  remdesivir 200 mg in sodium chloride 0.9% 250 mL IVPB       "Followed by" Linked Group Details   200 mg 580 mL/hr over 30 Minutes Intravenous Once 08/06/20 1931 08/07/20 0308      Subjective: -Remains tachypneic with minimal activity even in bed -Complains of some discomfort in his left lower leg -Denies any nausea or vomiting, feels that his dyspnea is improving overall very slow  Objective: Vitals:   08/30/20 0743 08/30/20 0815  08/30/20 1038 08/30/20 1155  BP: 126/81  119/78 105/64  Pulse: 74 98 87 75  Resp: (!) 23 20 (!) 31 (!) 28  Temp: 97.9 F (36.6 C)   98 F (36.7 C)  TempSrc: Oral   Oral  SpO2: 96% 94% 93% 98%  Weight:      Height:        Intake/Output Summary (Last 24 hours) at 08/30/2020 1417 Last data filed at 08/30/2020 1330 Gross per 24 hour  Intake 480 ml  Output 2122 ml  Net -1642 ml   Filed Weights   08/25/20 0500 08/26/20 0600 08/27/20 0347  Weight: 92.4 kg 91.4 kg 91.2 kg    Examination:  General exam: Chronically ill middle-aged male sitting up in bed, mild respiratory distress, tachypneic with minimal activity including talking CVS: S1-S2, regular rhythm, tachycardic Lungs: Bilateral fine rales, worsened at the bases Abdomen: Soft, nontender, bowel sounds present Extremities: No edema Skin no rashes on exposed skin Psych, appropriate mood and affect   Data Reviewed: I have personally reviewed following labs and imaging studies  CBC: Recent Labs  Lab 08/26/20 0129 08/27/20 0031 08/28/20 0512 08/29/20 0902 08/30/20 0630  WBC 17.6* 17.1* 13.4* 12.0* 10.8*  HGB 10.1* 10.4* 10.6* 10.8* 10.8*  HCT 31.8* 32.5* 34.0* 34.8* 34.3*  MCV 90.3 90.5 92.1 90.9 90.3  PLT 169 201 202 247 262   Basic Metabolic Panel: Recent Labs  Lab 08/23/20 1858  08/23/20 1858 08/24/20 0250 08/24/20 0253 08/26/20 0129 08/26/20 0129 08/27/20 0031 08/27/20 0031 08/27/20 0828 08/28/20 0512 08/28/20 0930 08/29/20 0902 08/30/20 0630  NA 136   < > 137   < > 142  --  139  --   --  138  --  132* 135  K 4.4   < > 4.2   < > 5.0   < > 5.2*   < > 4.9 5.6* 5.6* 4.4 4.8  CL 101   < > 103   < > 109  --  107  --   --  103  --  97* 98  CO2 22   < > 21*   < > 25  --  26  --   --  26  --  27 30  GLUCOSE 144*   < > 132*   < > 131*  --  109*  --   --  115*  --  181* 78  BUN 71*   < > 73*   < > 46*  --  36*  --   --  34*  --  29* 26*  CREATININE 2.60*   < > 2.16*   < > 1.33*  --  1.10  --   --  1.09  --  1.02 0.98  CALCIUM 7.3*   < > 7.4*   < > 8.1*  --  8.6*  --   --  8.6*  --  8.5* 8.7*  MG 2.7*  --  2.8*  --   --   --   --   --   --  2.1  --  1.8  --   PHOS  --   --   --   --   --   --   --   --   --  4.6  --  3.1  --    < > = values in this interval not displayed.   GFR: Estimated Creatinine Clearance: 92.4 mL/min (by C-G formula based on SCr of 0.98 mg/dL). Liver  Function Tests: Recent Labs  Lab 08/25/20 0048 08/26/20 0129 08/27/20 0031 08/28/20 0512 08/29/20 0902  AST 1,609* 420* 175* 93* 70*  ALT 3,848* 2,222* 1,551* 1,018* 692*  ALKPHOS 102 112 124 118 119  BILITOT 1.3* 1.2 0.9 0.9 1.2  PROT 5.9* 5.5* 5.8* 5.9* 5.9*  ALBUMIN 2.6* 2.4* 2.5* 2.5* 2.5*   No results for input(s): LIPASE, AMYLASE in the last 168 hours. No results for input(s): AMMONIA in the last 168 hours. Coagulation Profile: Recent Labs  Lab 08/26/20 0129 08/27/20 0031 08/28/20 0512 08/29/20 0902 08/30/20 0630  INR 1.3* 1.2 1.2 1.2 1.1   Cardiac Enzymes: No results for input(s): CKTOTAL, CKMB, CKMBINDEX, TROPONINI in the last 168 hours. BNP (last 3 results) No results for input(s): PROBNP in the last 8760 hours. HbA1C: No results for input(s): HGBA1C in the last 72 hours. CBG: Recent Labs  Lab 08/29/20 2007 08/29/20 2328 08/30/20 0439 08/30/20 0740  08/30/20 1154  GLUCAP 189* 100* 78 71 104*   Lipid Profile: No results for input(s): CHOL, HDL, LDLCALC, TRIG, CHOLHDL, LDLDIRECT in the last 72 hours. Thyroid Function Tests: No results for input(s): TSH, T4TOTAL, FREET4, T3FREE, THYROIDAB in the last 72 hours. Anemia Panel: No results for input(s): VITAMINB12, FOLATE, FERRITIN, TIBC, IRON, RETICCTPCT in the last 72 hours. Sepsis Labs: Recent Labs  Lab 08/23/20 1858 08/24/20 0250  PROCALCITON 2.44 2.13  LATICACIDVEN 2.5*  --     Recent Results (from the past 240 hour(s))  Culture, blood (routine x 2)     Status: None (Preliminary result)   Collection Time: 08/23/20  6:08 AM   Specimen: BLOOD RIGHT ARM  Result Value Ref Range Status   Specimen Description BLOOD RIGHT ARM  Final   Special Requests   Final    BOTTLES DRAWN AEROBIC AND ANAEROBIC Blood Culture adequate volume   Culture   Final    NO GROWTH 4 DAYS Performed at [facility], [address] [place], [place] [id number]    Report Status PENDING  Incomplete     Radiology Studies: No results found.  Scheduled Meds: . sodium chloride   Intravenous Once  . sodium chloride   Intravenous Once  . sodium chloride   Intravenous Once  . Chlorhexidine Gluconate Cloth  6 each Topical Daily  . feeding supplement (ENSURE ENLIVE)  237 mL Oral TID BM  . insulin aspart  0-15 Units Subcutaneous Q4H  . mouth rinse  15 mL Mouth Rinse BID  . melatonin  5 mg Oral QHS  . methylPREDNISolone (SOLU-MEDROL) injection  40 mg Intravenous Daily  . metoprolol tartrate  25 mg Oral BID  . multivitamin with minerals  1 tablet Oral Daily  . pantoprazole  40 mg Oral QHS  . polyethylene glycol  17 g Oral BID  . senna  2 tablet Oral BID AC & HS   Continuous Infusions: . sodium chloride Stopped (08/24/20 1800)  . sodium chloride Stopped (08/24/20 1800)     LOS: 24 days    Time spent: 35 mins. Zannie CovePreetha Jaynia Fendley, MD [facility]

## 2020-08-30 NOTE — Progress Notes (Signed)
Pt's left leg significantly warmer than right. Pt also says that is more sensitive on palpation. Pt with hx of DVTs.  Tele monitoring also called stating pt showed 8 beats v-tach. Pt denies chest pain and is asymptomatic on assessment.  Dr. Jomarie Longs text-paged to make aware of both.   Robina Ade, RN

## 2020-08-30 NOTE — Progress Notes (Signed)
Occupational Therapy Treatment Patient Details Name: Henry May MRN: [id number] DOB: 1966-12-06 Today's Date: 08/30/2020    History of present illness 54 yo unvaccinated male admitted with hypoxia and WOB due to Covid 19 on 8/22 with initial diagnosis 8/12.  PEA arrest in hospital on 9/7 resulting in intubation.  Pt VDRF. IVC filter placed 9/9.   OT comments  Pt presents supine in bed very pleasant and willing to participate in therapy session. Pt tolerating activity EOB including theraband exercise for UB strength and overall endurance. Pt additionally engaging in x2 bouts of standing marching at RW with minguard assist. Much education provided on slow, deep breathing techniques. Pt initially on 12L HFNC upon arrival, bumped to 14L HFNC with activity with lowest SpO2 noted 84% with activity, max RR noted 43. Given cues pt able to increase SpO2 to 90-91% and RR down to 20s between bouts of activity. Once returned to supine and practicing use of IS SpO2 96% and RR 16-19 on 12L HFNC. Feel pt remains excellent candidate for CIR level therapies. Will continue to follow acutely.   Follow Up Recommendations  CIR;Supervision/Assistance - 24 hour    Equipment Recommendations  3 in 1 bedside commode          Precautions / Restrictions Precautions Precautions: Fall Precaution Comments: watch sats, RR Restrictions Weight Bearing Restrictions: No       Mobility Bed Mobility Overal bed mobility: Needs Assistance Bed Mobility: Supine to Sit;Sit to Supine     Supine to sit: Supervision Sit to supine: Supervision   General bed mobility comments: able to lift legs out and into bed and lift trunk with S for lines  Transfers Overall transfer level: Needs assistance Equipment used: Rolling walker (2 wheeled) Transfers: Sit to/from Stand Sit to Stand: Min guard         General transfer comment: x 2 reps for LE strength and endurance; VCs for safe hand placement     Balance Overall  balance assessment: Needs assistance Sitting-balance support: Feet supported Sitting balance-Leahy Scale: Good     Standing balance support: Bilateral upper extremity supported Standing balance-Leahy Scale: Poor Standing balance comment: UE support for balance                           ADL either performed or assessed with clinical judgement   ADL Overall ADL's : Needs assistance/impaired     Grooming: Set up;Sitting                               Functional mobility during ADLs: Min guard;Rolling walker                         Cognition Arousal/Alertness: Awake/alert Behavior During Therapy: WFL for tasks assessed/performed Overall Cognitive Status: Within Functional Limits for tasks assessed                                 General Comments: very pleasant and motivated         Exercises Exercises: Other exercises;General Upper Extremity General Exercises - Upper Extremity Elbow Flexion: AROM;Both;5 reps;Theraband Theraband Level (Elbow Flexion): Level 3 (Green) Elbow Extension: AROM;Both;5 reps;Theraband Theraband Level (Elbow Extension): Level 3 (Green) Other Exercises Other Exercises: standing marching in place, completed x2 sets with 7-10 marches each set, seated rest in between;  minguard assist for balance at RW noted pt able to march RLE with increased ease than LLE Other Exercises: IS x3, pulling 500-750 ml Other Exercises: 5 draws on incentive up to cues for technique   Shoulder Instructions       General Comments 10-12L HFNC SpO2 96% at rest, down to 82% after marching and heel raises, slowly back up to 92% after first bout of standing marching then up to only 88% after second bout cues for PLB and about 1.5 minutes for recovery RR max 41, maintaining in 20-30's with RR even while recovering    Pertinent Vitals/ Pain       Pain Assessment: No/denies pain  Home Living                                           Prior Functioning/Environment              Frequency  Min 2X/week        Progress Toward Goals  OT Goals(current goals can now be found in the care plan section)  Progress towards OT goals: Progressing toward goals  Acute Rehab OT Goals Patient Stated Goal: to walk OT Goal Formulation: With patient Time For Goal Achievement: 09/13/20 Potential to Achieve Goals: Good ADL Goals Pt Will Perform Grooming: sitting;with modified independence Pt Will Perform Lower Body Dressing: with set-up;with supervision;sit to/from stand Pt Will Transfer to Toilet: with set-up;with supervision;ambulating;bedside commode Pt Will Perform Toileting - Clothing Manipulation and hygiene: with set-up;with supervision;sitting/lateral leans Pt/caregiver will Perform Home Exercise Program: Increased strength;Independently;With written HEP provided;With theraband Additional ADL Goal #1: Pt will independently verbalize three energy conservation techniques for ADLs Additional ADL Goal #2: Pt will monitor SpO2 and use purse lip breathing with Min cues  Plan Discharge plan remains appropriate    Co-evaluation                 AM-PAC OT "6 Clicks" Daily Activity     Outcome Measure   Help from another person eating meals?: A Little Help from another person taking care of personal grooming?: A Little Help from another person toileting, which includes using toliet, bedpan, or urinal?: A Little Help from another person bathing (including washing, rinsing, drying)?: A Little Help from another person to put on and taking off regular upper body clothing?: A Little Help from another person to put on and taking off regular lower body clothing?: A Lot 6 Click Score: 17    End of Session Equipment Utilized During Treatment: Oxygen;Rolling walker  OT Visit Diagnosis: Unsteadiness on feet (R26.81);Other abnormalities of gait and mobility (R26.89);Muscle weakness (generalized) (M62.81)    Activity Tolerance Patient tolerated treatment well   Patient Left in bed;with call bell/phone within reach   Nurse Communication Mobility status        Time: 5427-0623 OT Time Calculation (min): 28 min  Charges: OT General Charges $OT Visit: 1 Visit OT Treatments $Therapeutic Activity: 23-37 mins  Marcy Siren, OT Acute Rehabilitation Services Pager [phone] Office [phone]   Henry May 08/30/2020, 5:34 PM

## 2020-08-30 NOTE — Progress Notes (Signed)
Physical Therapy Treatment Patient Details Name: Henry May MRN: [id number] DOB: [birth year] Today's Date: 08/30/2020    History of Present Illness 54 yo unvaccinated male admitted with hypoxia and WOB due to Covid 19 on 8/22 with initial diagnosis 8/12.  PEA arrest in hospital on 9/7 resulting in intubation.  Pt VDRF. IVC filter placed 9/9.    PT Comments    Patient progressing slowly, but motivated and eager to work to improve.  Continued education on listening to his body and resting between bouts of activity.  Issued green theraband for in bed work on upper body strength.  Still with high O2 requirement and unable to ambulate away from O2 source.  Did report he was up marching at the bedside earlier today as well.  PT to continue to follow.  Hopeful for improvement to allow home vs CIR.    Follow Up Recommendations  Supervision/Assistance - 24 hour;CIR     Equipment Recommendations  Rolling walker with 5" wheels    Recommendations for Other Services       Precautions / Restrictions Precautions Precautions: Fall Precaution Comments: watch sats, RR    Mobility  Bed Mobility Overal bed mobility: Needs Assistance Bed Mobility: Supine to Sit;Sit to Supine     Supine to sit: Supervision Sit to supine: Supervision   General bed mobility comments: able to lift legs out and into bed and lift trunk with S for lines  Transfers Overall transfer level: Needs assistance Equipment used: Rolling walker (2 wheeled) Transfers: Sit to/from Stand Sit to Stand: Min guard         General transfer comment: x 2 reps for LE strength and endurance  Ambulation/Gait             General Gait Details: marching in place x 10 each time standing (x2)   Stairs             Wheelchair Mobility    Modified Rankin (Stroke Patients Only)       Balance Overall balance assessment: Needs assistance Sitting-balance support: Feet supported Sitting balance-Leahy Scale: Good      Standing balance support: Bilateral upper extremity supported Standing balance-Leahy Scale: Poor Standing balance comment: UE support for balance                            Cognition   Behavior During Therapy: WFL for tasks assessed/performed Overall Cognitive Status: Within Functional Limits for tasks assessed                                        Exercises Other Exercises Other Exercises: standing heel raises x 3 Other Exercises: in supine green t-band bicep curls, x 10, rows x 10 Other Exercises: 5 draws on incentive up to cues for technique    General Comments General comments (skin integrity, edema, etc.): 10-12L HFNC SpO2 96% at rest, down to 82% after marching and heel raises, slowly back up to 92% after first bout of standing marching then up to only 88% after second bout cues for PLB and about 1.5 minutes for recovery RR max 41, maintaining in 20-30's with RR even while recovering      Pertinent Vitals/Pain Pain Assessment: No/denies pain    Home Living  Prior Function            PT Goals (current goals can now be found in the care plan section) Progress towards PT goals: Progressing toward goals    Frequency    Min 3X/week      PT Plan Current plan remains appropriate    Co-evaluation              AM-PAC PT "6 Clicks" Mobility   Outcome Measure  Help needed turning from your back to your side while in a flat bed without using bedrails?: A Little Help needed moving from lying on your back to sitting on the side of a flat bed without using bedrails?: A Little Help needed moving to and from a bed to a chair (including a wheelchair)?: A Little Help needed standing up from a chair using your arms (e.g., wheelchair or bedside chair)?: A Little Help needed to walk in hospital room?: Total Help needed climbing 3-5 steps with a railing? : Total 6 Click Score: 14    End of Session  Equipment Utilized During Treatment: Oxygen Activity Tolerance: Patient limited by fatigue Patient left: with call bell/phone within reach;in chair   PT Visit Diagnosis: Other abnormalities of gait and mobility (R26.89);Muscle weakness (generalized) (M62.81);Difficulty in walking, not elsewhere classified (R26.2)     Time: 2751-7001 PT Time Calculation (min) (ACUTE ONLY): 27 min  Charges:  $Therapeutic Exercise: 8-22 mins $Therapeutic Activity: 8-22 mins                     Sheran Lawless, PT Acute Rehabilitation Services Pager:[phone] Office:[phone] 08/30/2020    [name] 08/30/2020, 5:18 PM

## 2020-08-31 LAB — BASIC METABOLIC PANEL
Anion gap: 11 (ref 5–15)
BUN: 27 mg/dL — ABNORMAL HIGH (ref 6–20)
CO2: 28 mmol/L (ref 22–32)
Calcium: 9 mg/dL (ref 8.9–10.3)
Chloride: 97 mmol/L — ABNORMAL LOW (ref 98–111)
Creatinine, Ser: 1 mg/dL (ref 0.61–1.24)
GFR calc Af Amer: >60 mL/min (ref >60)
GFR calc non Af Amer: >60 mL/min (ref >60)
Glucose, Bld: 126 mg/dL — ABNORMAL HIGH (ref 70–99)
Potassium: 4.3 mmol/L (ref 3.5–5.1)
Sodium: 136 mmol/L (ref 135–145)

## 2020-08-31 LAB — CBC
HCT: 37.2 % — ABNORMAL LOW (ref 39.0–52.0)
Hemoglobin: 11.8 g/dL — ABNORMAL LOW (ref 13.0–17.0)
MCH: 28.7 pg (ref 26.0–34.0)
MCHC: 31.7 g/dL (ref 30.0–36.0)
MCV: 90.5 fL (ref 80.0–100.0)
Platelets: 306 10*3/uL (ref 150–400)
RBC: 4.11 MIL/uL — ABNORMAL LOW (ref 4.22–5.81)
RDW: 17.1 % — ABNORMAL HIGH (ref 11.5–15.5)
WBC: 12.3 10*3/uL — ABNORMAL HIGH (ref 4.0–10.5)
nRBC: 0 % (ref 0.0–0.2)

## 2020-08-31 LAB — PROTIME-INR
INR: 1.1 (ref 0.8–1.2)
Prothrombin Time: 14 seconds (ref 11.4–15.2)

## 2020-08-31 MED ORDER — INFLUENZA VAC SPLIT QUAD 0.5 ML IM SUSY
0.5000 mL | PREFILLED_SYRINGE | INTRAMUSCULAR | Status: AC
  Administered 2020-09-04: 0.5 mL via INTRAMUSCULAR
  Filled 2020-08-31: qty 0.5

## 2020-08-31 NOTE — Progress Notes (Signed)
Nutrition Follow-up  DOCUMENTATION CODES:   Not applicable  INTERVENTION:  Continue Ensure Enlive po TID, each supplement provides 350 kcal and 20 grams of protein.  Encourage adequate PO intake.   NUTRITION DIAGNOSIS:   Increased nutrient needs related to catabolic illness (COVID-19) as evidenced by estimated needs; ongoing  GOAL:   Patient will meet greater than or equal to 90% of their needs; progressing  MONITOR:   PO intake, Supplement acceptance, Labs  REASON FOR ASSESSMENT:   Ventilator (okay to begin TF per discussion in rounds)    ASSESSMENT:   54 yo male with no significant PMH admitted with increasing SOB, COVID positive on 8/12. Extubated 9/9.   Pt is currently on 8 L nasal cannula. Meal completion has been 60-100%. Pt currently has Ensure ordered and has been consuming them. RD to continue with current orders to aid in caloric and protein needs. Pt encouraged to eat his food at meals and to drink his supplements.    Labs and medications reviewed.   Diet Order:   Diet Order            Diet regular Room service appropriate? Yes; Fluid consistency: Thin  Diet effective now                 EDUCATION NEEDS:   No education needs have been identified at this time  Skin:  Skin Assessment: Reviewed RN Assessment  Last BM:  9/15  Height:   Ht Readings from Last 1 Encounters:  08/23/20 6' (1.829 m)    Weight:   Wt Readings from Last 1 Encounters:  08/31/20 89 kg    Ideal Body Weight:  80.9 kg  BMI:  Body mass index is 26.61 kg/m.  Estimated Nutritional Needs:   Kcal:  2300-2500  Protein:  115-135 grams  Fluid:  >/= 2 L/day  Roslyn Smiling, MS, RD, LDN RD pager number/after hours weekend pager number on Amion.

## 2020-08-31 NOTE — Progress Notes (Signed)
PROGRESS NOTE    Henry May  OMB:[id number] DOB: 11/26/1966 DOA: 08/06/2020 PCP: Patient, No Pcp Per   Brief Narrative:  54/M with no significant past medical history presented in the ED with increasing shortness of breath.  He was diagnosed with COVID-19 on 8/12 and then has worsening shortness of breath.  He was admitted on 8/22 for acute respiratory failure secondary to Covid pneumonia requiring high flow oxygen and BiPAP.  He was transferred to the ICU for further care.  He received treatment for Covid pneumonia including remdesivir, Baricitinib and IV steroids.  He was transferred out of the ICU on 9/3.  He then developed lower extremity DVT, was started on heparin. Later  developed large retroperitoneal hematoma and hemorrhagic shock resulting in a brief PEA arrest.  Achieved ROSC, was intubated, started on Levophed and transferred back to the ICU, transfused PRBC.  he underwent IVC filter placement, anticoagulation discontinued.  He was successfully extubated 9/9, off pressors .  He is back on high flow nasal cannula at 10-12 L sats 94%.  still quite short of breath with activity.  He is off airborne isolation.  Assessment & Plan:  Acute hypoxemic respiratory failure/COVID-19 pneumonia :  He required very high concentrations of high flow oxygen and BiPAP,  admitted in the ICU, was very tenuous tenuous initially and then stabilized -This was followed by PEA arrest in the setting of hemorrhagic shock and large retroperitoneal hematoma requiring pressor support and mechanical ventilation, successfully extubated 9/9. -still requiring HFNC @ 10-12L -Keep saturation above 90-92%.  Wean O2 as tolerated -He has completed treatment for Covid pneumonia, including remdesivir, baricitinib and has been on a prolonged course of IV Solu-Medrol, will taper this, also check CRP and Ferritin tomorrow -Out of bed, encouraged proning as tolerated -Incentive spirometry -Wean O2 as tolerated  PEA  arrest Hemorrhagic shock: resolved   Status post cardiac arrest, PEA lasted about 4 minutes, secondary to hemorrhagic shock from a massive retroperitoneal bleed -Required 2 units PRBC/ 2 FFP -Was transferred to the ICU and transferred out subsequently -Hb stable  Acute retroperitoneal bleed  Has developed massive left retroperitoneal bleed 13 x 11 cm Required 2 units PRBC , 2 FFPS.  -Hemoglobin stable, vitals have stabilized as well  Thrombocytopenia - Resolved.  Leukocytosis - Improving. -Continue to monitor  CHF with normal ejection fraction: -continue metoprolol.  Acute kidney injury - Resolved  -Renally dose medications -Maintain euvolemia. -Avoid nephrotoxins  Hyperkalemia : >> Resolved. -Treated with Lokelma last week  Bilateral lower extremity DVT. -Post IVC filter 9/9. -Hold anticoagulation.  QT prolongation -Continue to monitor.  Sinus tachycardia:  resolved.  Essential hypertension -Continue beta-blockade.  Elevated transaminases, hyperbilirubinemia -Likely due to acute illness.  Hyperbilirubinemia may be due to degraded blood cells in RP space. - con't to monitor  - LFTS trending down.  DVT prophylaxis: SCDs Code Status: Full Family Communication: Wife was at bed side. Disposition Plan:  Status is: Inpatient  Remains inpatient appropriate because:Inpatient level of care appropriate due to severity of illness   Dispo: The patient is from: Home              Anticipated d/c is to: Home              Anticipated d/c date is: Pending improvement in dyspnea and oxygen requirement              Patient currently is not medically stable to d/c.   Consultants:   PCCM   Procedures: Intubated,  extubated, central lines.  Antimicrobials: Anti-infectives (From admission, onward)   Start     Dose/Rate Route Frequency Ordered Stop   08/24/20 2200  ceFEPIme (MAXIPIME) 2 g in sodium chloride 0.9 % 100 mL IVPB  Status:  Discontinued        2 g 200  mL/hr over 30 Minutes Intravenous Every 12 hours 08/24/20 0813 08/24/20 0828   08/24/20 0900  vancomycin (VANCOREADY) IVPB 1750 mg/350 mL  Status:  Discontinued        1,750 mg 175 mL/hr over 120 Minutes Intravenous  Once 08/24/20 0813 08/24/20 0828   08/23/20 0400  ceFEPIme (MAXIPIME) 2 g in sodium chloride 0.9 % 100 mL IVPB  Status:  Discontinued        2 g 200 mL/hr over 30 Minutes Intravenous Every 24 hours 08/23/20 0256 08/24/20 0813   08/23/20 0400  vancomycin (VANCOREADY) IVPB 1750 mg/350 mL        1,750 mg 175 mL/hr over 120 Minutes Intravenous  Once 08/23/20 0256 08/23/20 0740   08/23/20 0345  metroNIDAZOLE (FLAGYL) IVPB 500 mg  Status:  Discontinued        500 mg 100 mL/hr over 60 Minutes Intravenous Every 8 hours 08/23/20 0249 08/24/20 0828   08/23/20 0256  vancomycin variable dose per unstable renal function (pharmacist dosing)  Status:  Discontinued         Does not apply See admin instructions 08/23/20 0256 08/24/20 0828   08/09/20 0600  vancomycin (VANCOREADY) IVPB 750 mg/150 mL  Status:  Discontinued        750 mg 150 mL/hr over 60 Minutes Intravenous Every 12 hours 08/08/20 1342 08/09/20 1153   08/08/20 0600  vancomycin (VANCOREADY) IVPB 1250 mg/250 mL  Status:  Discontinued        1,250 mg 166.7 mL/hr over 90 Minutes Intravenous Every 24 hours 08/07/20 0602 08/08/20 1342   08/08/20 0000  cefTRIAXone (ROCEPHIN) 2 g in sodium chloride 0.9 % 100 mL IVPB  Status:  Discontinued        2 g 200 mL/hr over 30 Minutes Intravenous Every 24 hours 08/07/20 0119 08/07/20 0535   08/07/20 1000  remdesivir 100 mg in sodium chloride 0.9 % 100 mL IVPB       "Followed by" Linked Group Details   100 mg 200 mL/hr over 30 Minutes Intravenous Every 24 hours 08/06/20 1931 08/10/20 1130   08/07/20 0630  ceFEPIme (MAXIPIME) 2 g in sodium chloride 0.9 % 100 mL IVPB        2 g 200 mL/hr over 30 Minutes Intravenous Every 8 hours 08/07/20 0602 08/13/20 2155   08/07/20 0630  vancomycin (VANCOREADY)  IVPB 2000 mg/400 mL        2,000 mg 200 mL/hr over 120 Minutes Intravenous  Once 08/07/20 0602 08/07/20 0846   08/07/20 0200  azithromycin (ZITHROMAX) 500 mg in sodium chloride 0.9 % 250 mL IVPB  Status:  Discontinued        500 mg 250 mL/hr over 60 Minutes Intravenous Every 24 hours 08/07/20 0119 08/07/20 0535   08/07/20 0130  cefTRIAXone (ROCEPHIN) 2 g in sodium chloride 0.9 % 100 mL IVPB        2 g 200 mL/hr over 30 Minutes Intravenous  Once 08/07/20 0124 08/07/20 0251   08/06/20 2000  remdesivir 200 mg in sodium chloride 0.9% 250 mL IVPB       "Followed by" Linked Group Details   200 mg 580 mL/hr over 30 Minutes Intravenous Once 08/06/20  1931 08/07/20 0308      Subjective: -Feels a little better today, remains dyspneic with minimal activity -Unable to prone completely however has been laying on his side  Objective: Vitals:   08/31/20 0359 08/31/20 0426 08/31/20 0805 08/31/20 1136  BP:  113/70 119/77 119/78  Pulse:  66 91 (!) 103  Resp:  20 17 (!) 25  Temp:  (!) 97.5 F (36.4 C) 97.9 F (36.6 C) 98.3 F (36.8 C)  TempSrc:  Oral Axillary Axillary  SpO2:  100% 95% 90%  Weight: 89 kg     Height:        Intake/Output Summary (Last 24 hours) at 08/31/2020 1147 Last data filed at 08/31/2020 0900 Gross per 24 hour  Intake 660 ml  Output 2200 ml  Net -1540 ml   Filed Weights   08/26/20 0600 08/27/20 0347 08/31/20 0359  Weight: 91.4 kg 91.2 kg 89 kg    Examination:  General exam: Chronically ill middle-aged male sitting up in the recliner, mild respiratory respiratory distress, tachypneic while talking CVS: S1-S2, regular rhythm, tachycardic Lungs: Few bilateral fine rales especially at the bases Abdomen: Soft, nontender, bowel sounds present Extremities: No edema Skin: No rashes on exposed skin  Psych, appropriate mood and affect   Data Reviewed: I have personally reviewed following labs and imaging studies  CBC: Recent Labs  Lab 08/27/20 0031 08/28/20 0512  08/29/20 0902 08/30/20 0630 08/31/20 0830  WBC 17.1* 13.4* 12.0* 10.8* 12.3*  HGB 10.4* 10.6* 10.8* 10.8* 11.8*  HCT 32.5* 34.0* 34.8* 34.3* 37.2*  MCV 90.5 92.1 90.9 90.3 90.5  PLT 201 202 247 262 306   Basic Metabolic Panel: Recent Labs  Lab 08/27/20 0031 08/27/20 0828 08/28/20 0512 08/28/20 0930 08/29/20 0902 08/30/20 0630 08/31/20 0830  NA 139  --  138  --  132* 135 136  K 5.2*   < > 5.6* 5.6* 4.4 4.8 4.3  CL 107  --  103  --  97* 98 97*  CO2 26  --  26  --  27 30 28   GLUCOSE 109*  --  115*  --  181* 78 126*  BUN 36*  --  34*  --  29* 26* 27*  CREATININE 1.10  --  1.09  --  1.02 0.98 1.00  CALCIUM 8.6*  --  8.6*  --  8.5* 8.7* 9.0  MG  --   --  2.1  --  1.8  --   --   PHOS  --   --  4.6  --  3.1  --   --    < > = values in this interval not displayed.   GFR: Estimated Creatinine Clearance: 90.5 mL/min (by C-G formula based on SCr of 1 mg/dL). Liver Function Tests: Recent Labs  Lab 08/25/20 0048 08/26/20 0129 08/27/20 0031 08/28/20 0512 08/29/20 0902  AST 1,609* 420* 175* 93* 70*  ALT 3,848* 2,222* 1,551* 1,018* 692*  ALKPHOS 102 112 124 118 119  BILITOT 1.3* 1.2 0.9 0.9 1.2  PROT 5.9* 5.5* 5.8* 5.9* 5.9*  ALBUMIN 2.6* 2.4* 2.5* 2.5* 2.5*   No results for input(s): LIPASE, AMYLASE in the last 168 hours. No results for input(s): AMMONIA in the last 168 hours. Coagulation Profile: Recent Labs  Lab 08/27/20 0031 08/28/20 0512 08/29/20 0902 08/30/20 0630 08/31/20 0830  INR 1.2 1.2 1.2 1.1 1.1   Cardiac Enzymes: No results for input(s): CKTOTAL, CKMB, CKMBINDEX, TROPONINI in the last 168 hours. BNP (last 3 results) No results  for input(s): PROBNP in the last 8760 hours. HbA1C: No results for input(s): HGBA1C in the last 72 hours. CBG: Recent Labs  Lab 08/29/20 2328 08/30/20 0439 08/30/20 0740 08/30/20 1154 08/30/20 1602  GLUCAP 100* 78 71 104* 139*   Lipid Profile: No results for input(s): CHOL, HDL, LDLCALC, TRIG, CHOLHDL, LDLDIRECT in the  last 72 hours. Thyroid Function Tests: No results for input(s): TSH, T4TOTAL, FREET4, T3FREE, THYROIDAB in the last 72 hours. Anemia Panel: No results for input(s): VITAMINB12, FOLATE, FERRITIN, TIBC, IRON, RETICCTPCT in the last 72 hours. Sepsis Labs: No results for input(s): PROCALCITON, LATICACIDVEN in the last 168 hours.  Recent Results (from the past 240 hour(s))  Culture, blood (routine x 2)     Status: None   Collection Time: 08/23/20  6:08 AM   Specimen: BLOOD RIGHT ARM  Result Value Ref Range Status   Specimen Description BLOOD RIGHT ARM  Final   Special Requests   Final    BOTTLES DRAWN AEROBIC AND ANAEROBIC Blood Culture adequate volume   Culture   Final    NO GROWTH 7 DAYS Performed at [facility], [address], [place], [place] [address]    Report Status 08/30/2020 FINAL  Final     Radiology Studies: No results found.  Scheduled Meds: . Chlorhexidine Gluconate Cloth  6 each Topical Daily  . feeding supplement (ENSURE ENLIVE)  237 mL Oral TID BM  . mouth rinse  15 mL Mouth Rinse BID  . melatonin  5 mg Oral QHS  . methylPREDNISolone (SOLU-MEDROL) injection  20 mg Intravenous Daily  . metoprolol tartrate  25 mg Oral BID  . multivitamin with minerals  1 tablet Oral Daily  . pantoprazole  40 mg Oral QHS  . polyethylene glycol  17 g Oral BID  . senna  2 tablet Oral BID AC & HS   Continuous Infusions: . sodium chloride Stopped (08/24/20 1800)  . sodium chloride Stopped (08/24/20 1800)     LOS: 25 days    Time spent: 35 mins. Zannie Cove, MD [facility]

## 2020-08-31 NOTE — Progress Notes (Signed)
Inpatient Rehab Admissions Coordinator:   At this time we are recommending a CIR consult and I will place an order per our protocol.   Estill Dooms, PT, DPT Admissions Coordinator [phone] 08/31/20  12:57 PM

## 2020-09-01 LAB — CBC
HCT: 33.7 % — ABNORMAL LOW (ref 39.0–52.0)
Hemoglobin: 10.9 g/dL — ABNORMAL LOW (ref 13.0–17.0)
MCH: 29.2 pg (ref 26.0–34.0)
MCHC: 32.3 g/dL (ref 30.0–36.0)
MCV: 90.3 fL (ref 80.0–100.0)
Platelets: 288 10*3/uL (ref 150–400)
RBC: 3.73 MIL/uL — ABNORMAL LOW (ref 4.22–5.81)
RDW: 17 % — ABNORMAL HIGH (ref 11.5–15.5)
WBC: 11.1 10*3/uL — ABNORMAL HIGH (ref 4.0–10.5)
nRBC: 0 % (ref 0.0–0.2)

## 2020-09-01 LAB — BASIC METABOLIC PANEL
Anion gap: 3 — ABNORMAL LOW (ref 5–15)
BUN: 24 mg/dL — ABNORMAL HIGH (ref 6–20)
CO2: 29 mmol/L (ref 22–32)
Calcium: 8.3 mg/dL — ABNORMAL LOW (ref 8.9–10.3)
Chloride: 102 mmol/L (ref 98–111)
Creatinine, Ser: 0.88 mg/dL (ref 0.61–1.24)
GFR calc Af Amer: >60 mL/min (ref >60)
GFR calc non Af Amer: >60 mL/min (ref >60)
Glucose, Bld: 110 mg/dL — ABNORMAL HIGH (ref 70–99)
Potassium: 4.4 mmol/L (ref 3.5–5.1)
Sodium: 134 mmol/L — ABNORMAL LOW (ref 135–145)

## 2020-09-01 LAB — C-REACTIVE PROTEIN: CRP: 1.2 mg/dL — ABNORMAL HIGH (ref <1.0)

## 2020-09-01 LAB — FERRITIN: Ferritin: 1565 ng/mL — ABNORMAL HIGH (ref 24–336)

## 2020-09-01 LAB — PROTIME-INR
INR: 1.1 (ref 0.8–1.2)
Prothrombin Time: 14 seconds (ref 11.4–15.2)

## 2020-09-01 NOTE — Progress Notes (Signed)
PROGRESS NOTE    Henry May  MCN:[id number] DOB: [birth year] DOA: 08/06/2020 PCP: Patient, No Pcp Per  Brief Narrative:  54/M with no significant past medical history presented in the ED with increasing shortness of breath.  He was diagnosed with COVID-19 on 8/12 and then has worsening shortness of breath.  He was admitted on 8/22 for acute respiratory failure secondary to Covid pneumonia requiring high flow oxygen and BiPAP.  He was transferred to the ICU for further care.  He received treatment for Covid pneumonia including remdesivir, Baricitinib and IV steroids.  He was transferred out of the ICU on 9/3.  He then developed lower extremity DVT, was started on heparin. Later  developed large retroperitoneal hematoma and hemorrhagic shock resulting in a brief PEA arrest.  Achieved ROSC, was intubated, started on Levophed and transferred back to the ICU, transfused PRBC.  he underwent IVC filter placement, anticoagulation discontinued.  He was successfully extubated 9/9, off pressors .  Still on high flow nasal cannula at 10-12 L sats 94%.  still quite short of breath with activity.  He is off airborne isolation.  Assessment & Plan:  Acute hypoxemic respiratory failure/COVID-19 pneumonia :  -required very high concentrations of high flow oxygen and BiPAP,  admitted in the ICU, was very tenuous tenuous initially on BIPAP and then stabilized -This was followed by PEA arrest in the setting of hemorrhagic shock and large retroperitoneal hematoma requiring pressor support and mechanical ventilation, successfully extubated 9/9, to HFNC @ 13-14L -He has completed treatment for Covid pneumonia, including remdesivir, baricitinib and has been on a prolonged course of IV Solu-Medrol, then tapered, will stop today -Out of bed, encouraged proning as tolerated -Incentive spirometry -Wean O2 as tolerated, now down to 6L -discharge planning ? Monday, maybe able to go home with [facility]  PEA arrest Hemorrhagic  shock: resolved   Status post cardiac arrest, PEA lasted about 4 minutes, secondary to hemorrhagic shock from a massive retroperitoneal bleed -Required 2 units PRBC/ 2 FFP -Was transferred to the ICU and transferred out subsequently -Hb stable  Acute retroperitoneal bleed  Has developed massive left retroperitoneal bleed 13 x 11 cm Required 2 units PRBC , 2 FFPS.  -Hemoglobin stable  Thrombocytopenia - Resolved.  Leukocytosis - Improving. -Continue to monitor  CHF with normal ejection fraction: -continue metoprolol.  Acute kidney injury - Resolved  -Renally dose medications -Maintain euvolemia. -Avoid nephrotoxins  Hyperkalemia : >> Resolved. -Treated with Lokelma last week  Bilateral lower extremity DVT. -Post IVC filter 9/9. -Hold anticoagulation.  QT prolongation -Continue to monitor.  Sinus tachycardia:  resolved.  Essential hypertension -Continue beta-blockade.  Elevated transaminases, hyperbilirubinemia -Likely due to acute illness.  Hyperbilirubinemia may be due to degraded blood cells in RP space. - con't to monitor  - LFTS trending down.  DVT prophylaxis: SCDs Code Status: Full Family Communication: Wife was at bed side. Disposition Plan:  Status is: Inpatient  Remains inpatient appropriate because:Inpatient level of care appropriate due to severity of illness   Dispo: The patient is from: Home              Anticipated d/c is to: Home              Anticipated d/c date is: Pending improvement in dyspnea and oxygen requirement, possibly monday              Patient currently is not medically stable to d/c.   Consultants:   PCCM   Procedures: Intubated, extubated, central lines.  Antimicrobials: Anti-infectives (From admission, onward)   Start     Dose/Rate Route Frequency Ordered Stop   08/24/20 2200  ceFEPIme (MAXIPIME) 2 g in sodium chloride 0.9 % 100 mL IVPB  Status:  Discontinued        2 g 200 mL/hr over 30 Minutes Intravenous  Every 12 hours 08/24/20 0813 08/24/20 0828   08/24/20 0900  vancomycin (VANCOREADY) IVPB 1750 mg/350 mL  Status:  Discontinued        1,750 mg 175 mL/hr over 120 Minutes Intravenous  Once 08/24/20 0813 08/24/20 0828   08/23/20 0400  ceFEPIme (MAXIPIME) 2 g in sodium chloride 0.9 % 100 mL IVPB  Status:  Discontinued        2 g 200 mL/hr over 30 Minutes Intravenous Every 24 hours 08/23/20 0256 08/24/20 0813   08/23/20 0400  vancomycin (VANCOREADY) IVPB 1750 mg/350 mL        1,750 mg 175 mL/hr over 120 Minutes Intravenous  Once 08/23/20 0256 08/23/20 0740   08/23/20 0345  metroNIDAZOLE (FLAGYL) IVPB 500 mg  Status:  Discontinued        500 mg 100 mL/hr over 60 Minutes Intravenous Every 8 hours 08/23/20 0249 08/24/20 0828   08/23/20 0256  vancomycin variable dose per unstable renal function (pharmacist dosing)  Status:  Discontinued         Does not apply See admin instructions 08/23/20 0256 08/24/20 0828   08/09/20 0600  vancomycin (VANCOREADY) IVPB 750 mg/150 mL  Status:  Discontinued        750 mg 150 mL/hr over 60 Minutes Intravenous Every 12 hours 08/08/20 1342 08/09/20 1153   08/08/20 0600  vancomycin (VANCOREADY) IVPB 1250 mg/250 mL  Status:  Discontinued        1,250 mg 166.7 mL/hr over 90 Minutes Intravenous Every 24 hours 08/07/20 0602 08/08/20 1342   08/08/20 0000  cefTRIAXone (ROCEPHIN) 2 g in sodium chloride 0.9 % 100 mL IVPB  Status:  Discontinued        2 g 200 mL/hr over 30 Minutes Intravenous Every 24 hours 08/07/20 0119 08/07/20 0535   08/07/20 1000  remdesivir 100 mg in sodium chloride 0.9 % 100 mL IVPB       "Followed by" Linked Group Details   100 mg 200 mL/hr over 30 Minutes Intravenous Every 24 hours 08/06/20 1931 08/10/20 1130   08/07/20 0630  ceFEPIme (MAXIPIME) 2 g in sodium chloride 0.9 % 100 mL IVPB        2 g 200 mL/hr over 30 Minutes Intravenous Every 8 hours 08/07/20 0602 08/13/20 2155   08/07/20 0630  vancomycin (VANCOREADY) IVPB 2000 mg/400 mL        2,000  mg 200 mL/hr over 120 Minutes Intravenous  Once 08/07/20 0602 08/07/20 0846   08/07/20 0200  azithromycin (ZITHROMAX) 500 mg in sodium chloride 0.9 % 250 mL IVPB  Status:  Discontinued        500 mg 250 mL/hr over 60 Minutes Intravenous Every 24 hours 08/07/20 0119 08/07/20 0535   08/07/20 0130  cefTRIAXone (ROCEPHIN) 2 g in sodium chloride 0.9 % 100 mL IVPB        2 g 200 mL/hr over 30 Minutes Intravenous  Once 08/07/20 0124 08/07/20 0251   08/06/20 2000  remdesivir 200 mg in sodium chloride 0.9% 250 mL IVPB       "Followed by" Linked Group Details   200 mg 580 mL/hr over 30 Minutes Intravenous Once 08/06/20 1931 08/07/20 0308  Subjective: -Feels better overall, unable to prone completely however laying on his sides as much as possible  Objective: Vitals:   09/01/20 0325 09/01/20 0400 09/01/20 0600 09/01/20 0802  BP:  113/80  118/73  Pulse:  85 66 77  Resp:  (!) 25  (!) 23  Temp:  97.7 F (36.5 C)  97.9 F (36.6 C)  TempSrc:  Axillary  Axillary  SpO2:  98% 99% 93%  Weight: 87.2 kg     Height:        Intake/Output Summary (Last 24 hours) at 09/01/2020 1137 Last data filed at 09/01/2020 0900 Gross per 24 hour  Intake 700 ml  Output 1625 ml  Net -925 ml   Filed Weights   08/27/20 0347 08/31/20 0359 09/01/20 0325  Weight: 91.2 kg 89 kg 87.2 kg    Examination:  General exam: Chronically ill middle-aged male sitting up in the recliner, respiratory distress is improving, less tachypneic while talking today CVS: S1-S2, regular rhythm, mildly tach cardiac Lungs: Few bilateral rales especially at the bases Abdomen: Soft, nontender, bowel sounds present Extremities: No edema Skin: No rashes on exposed skin  Psych, appropriate mood and affect   Data Reviewed: I have personally reviewed following labs and imaging studies  CBC: Recent Labs  Lab 08/28/20 0512 08/29/20 0902 08/30/20 0630 08/31/20 0830 09/01/20 0521  WBC 13.4* 12.0* 10.8* 12.3* 11.1*  HGB 10.6*  10.8* 10.8* 11.8* 10.9*  HCT 34.0* 34.8* 34.3* 37.2* 33.7*  MCV 92.1 90.9 90.3 90.5 90.3  PLT 202 247 262 306 288   Basic Metabolic Panel: Recent Labs  Lab 08/28/20 0512 08/28/20 0512 08/28/20 0930 08/29/20 0902 08/30/20 0630 08/31/20 0830 09/01/20 0521  NA 138  --   --  132* 135 136 134*  K 5.6*   < > 5.6* 4.4 4.8 4.3 4.4  CL 103  --   --  97* 98 97* 102  CO2 26  --   --  27 30 28 29   GLUCOSE 115*  --   --  181* 78 126* 110*  BUN 34*  --   --  29* 26* 27* 24*  CREATININE 1.09  --   --  1.02 0.98 1.00 0.88  CALCIUM 8.6*  --   --  8.5* 8.7* 9.0 8.3*  MG 2.1  --   --  1.8  --   --   --   PHOS 4.6  --   --  3.1  --   --   --    < > = values in this interval not displayed.   GFR: Estimated Creatinine Clearance: 102.9 mL/min (by C-G formula based on SCr of 0.88 mg/dL). Liver Function Tests: Recent Labs  Lab 08/26/20 0129 08/27/20 0031 08/28/20 0512 08/29/20 0902  AST 420* 175* 93* 70*  ALT 2,222* 1,551* 1,018* 692*  ALKPHOS 112 124 118 119  BILITOT 1.2 0.9 0.9 1.2  PROT 5.5* 5.8* 5.9* 5.9*  ALBUMIN 2.4* 2.5* 2.5* 2.5*   No results for input(s): LIPASE, AMYLASE in the last 168 hours. No results for input(s): AMMONIA in the last 168 hours. Coagulation Profile: Recent Labs  Lab 08/28/20 0512 08/29/20 0902 08/30/20 0630 08/31/20 0830 09/01/20 0521  INR 1.2 1.2 1.1 1.1 1.1   Cardiac Enzymes: No results for input(s): CKTOTAL, CKMB, CKMBINDEX, TROPONINI in the last 168 hours. BNP (last 3 results) No results for input(s): PROBNP in the last 8760 hours. HbA1C: No results for input(s): HGBA1C in the last 72 hours. CBG: Recent Labs  Lab 08/29/20 2328 08/30/20 0439 08/30/20 0740 08/30/20 1154 08/30/20 1602  GLUCAP 100* 78 71 104* 139*   Lipid Profile: No results for input(s): CHOL, HDL, LDLCALC, TRIG, CHOLHDL, LDLDIRECT in the last 72 hours. Thyroid Function Tests: No results for input(s): TSH, T4TOTAL, FREET4, T3FREE, THYROIDAB in the last 72 hours. Anemia  Panel: Recent Labs    09/01/20 0521  FERRITIN 1,565*   Sepsis Labs: No results for input(s): PROCALCITON, LATICACIDVEN in the last 168 hours.  Recent Results (from the past 240 hour(s))  Culture, blood (routine x 2)     Status: None   Collection Time: 08/23/20  6:08 AM   Specimen: BLOOD RIGHT ARM  Result Value Ref Range Status   Specimen Description BLOOD RIGHT ARM  Final   Special Requests   Final    BOTTLES DRAWN AEROBIC AND ANAEROBIC Blood Culture adequate volume   Culture   Final    NO GROWTH 7 DAYS Performed at [facility], [address] [place], [place] [address]    Report Status 08/30/2020 FINAL  Final     Radiology Studies: No results found.  Scheduled Meds: . Chlorhexidine Gluconate Cloth  6 each Topical Daily  . feeding supplement (ENSURE ENLIVE)  237 mL Oral TID BM  . influenza vac split quadrivalent PF  0.5 mL Intramuscular Tomorrow-1000  . mouth rinse  15 mL Mouth Rinse BID  . melatonin  5 mg Oral QHS  . methylPREDNISolone (SOLU-MEDROL) injection  20 mg Intravenous Daily  . metoprolol tartrate  25 mg Oral BID  . multivitamin with minerals  1 tablet Oral Daily  . pantoprazole  40 mg Oral QHS  . polyethylene glycol  17 g Oral BID  . senna  2 tablet Oral BID AC & HS   Continuous Infusions: . sodium chloride Stopped (08/24/20 1800)  . sodium chloride Stopped (08/24/20 1800)     LOS: 26 days   Time spent: 35 mins. Zannie Cove, MD [facility]

## 2020-09-01 NOTE — Progress Notes (Signed)
Inpatient Rehab Admissions Coordinator:   I met with Pt. And wife at bedside to discuss CIR admission. Pt. Was listed as uninsured; however, Pt.'s wife produced a card for an active [employer]. I have contacted billing to have Pt.'s information updated.  During this PM's PT session, pt. Was noted to be at supervision level for bed mobility and transfers and min guard for 80 ft of gait. Pt. Is also at min guard level with ADLs per OT note on 08/30/2020. Pt. Does not appear to require the intensity of CIR level therapies and his insurer is unlikely to approve admission. Pt. And wife state that they are comfortable taking Pt. Home and having [facility] [place] PT/OT as long as wife is trained in how to care for patient and Pt. Has oxygen set up when returning home. I will not pursue CIR admit for this Pt, I will sign off.   Clemens Catholic, [place], [place] Admissions Coordinator  [phone] ([place]) [phone] (office)

## 2020-09-01 NOTE — Progress Notes (Signed)
Physical Therapy Treatment Patient Details Name: Henry May MRN: [id number] DOB: [birth year] Today's Date: 09/01/2020    History of Present Illness 54 yo unvaccinated male admitted with hypoxia and WOB due to Covid 19 on 8/22 with initial diagnosis 8/12.  PEA arrest in hospital on 9/7 resulting in intubation.  Pt VDRF. IVC filter placed 9/9.    PT Comments    Patient progressing nicely able to get down enough to utilize extension tubing and portable O2 and noted SpO2 staying close to 90% most of ambulation on 6L O2 with lowest down to 84% but recovers to 90 within 45 seconds.  Patient performing in bed therex at his leisure with theraband.  Feel he should progress well with follow up rehab at d/c.  PT to follow. w   Follow Up Recommendations  Supervision/Assistance - 24 hour;CIR     Equipment Recommendations  Rolling walker with 5" wheels    Recommendations for Other Services       Precautions / Restrictions Precautions Precautions: Fall Precaution Comments: watch sats, RR    Mobility  Bed Mobility         Supine to sit: Supervision Sit to supine: Supervision   General bed mobility comments: assist for lines  Transfers Overall transfer level: Needs assistance Equipment used: Rolling walker (2 wheeled) Transfers: Sit to/from Stand Sit to Stand: Supervision         General transfer comment: able to stand with S for lines  Ambulation/Gait Ambulation/Gait assistance: Min guard Gait Distance (Feet): 80 Feet (x 2 and 60' x 1) Assistive device: Rolling walker (2 wheeled) Gait Pattern/deviations: Step-through pattern;Decreased stride length     General Gait Details: in room only per pt as connected initially to extension for O2 on 5L so stayed close and made 3 short laps, then used portable O2 @ 6LPM for ambulating to door then to window x 2   Stairs             Wheelchair Mobility    Modified Rankin (Stroke Patients Only)       Balance Overall  balance assessment: Needs assistance Sitting-balance support: Feet supported Sitting balance-Leahy Scale: Good     Standing balance support: No upper extremity supported Standing balance-Leahy Scale: Fair Standing balance comment: static standing no UE support but used walker for ambulation                            Cognition Arousal/Alertness: Awake/alert Behavior During Therapy: WFL for tasks assessed/performed Overall Cognitive Status: Within Functional Limits for tasks assessed                                        Exercises      General Comments General comments (skin integrity, edema, etc.): 5L O2  with one extension on tubing then used portable O2 @ 6L for further in room ambulation; encouraged hallway ambulation with nursing staff tomorrow.      Pertinent Vitals/Pain Pain Assessment: No/denies pain    Home Living                      Prior Function            PT Goals (current goals can now be found in the care plan section) Progress towards PT goals: Progressing toward goals    Frequency  Min 3X/week      PT Plan Current plan remains appropriate    Co-evaluation              AM-PAC PT "6 Clicks" Mobility   Outcome Measure  Help needed turning from your back to your side while in a flat bed without using bedrails?: A Little Help needed moving from lying on your back to sitting on the side of a flat bed without using bedrails?: A Little Help needed moving to and from a bed to a chair (including a wheelchair)?: A Little Help needed standing up from a chair using your arms (e.g., wheelchair or bedside chair)?: A Little Help needed to walk in hospital room?: A Little Help needed climbing 3-5 steps with a railing? : Total 6 Click Score: 16    End of Session Equipment Utilized During Treatment: Oxygen Activity Tolerance: Patient limited by fatigue Patient left: with call bell/phone within reach;in bed    PT Visit Diagnosis: Other abnormalities of gait and mobility (R26.89);Muscle weakness (generalized) (M62.81);Difficulty in walking, not elsewhere classified (R26.2)     Time: 8185-6314 PT Time Calculation (min) (ACUTE ONLY): 32 min  Charges:  $Gait Training: 23-37 mins                     Sheran Lawless, PT Acute Rehabilitation Services Pager:[phone] [phone] 09/01/2020    Henry May 09/01/2020, 3:55 PM

## 2020-09-02 LAB — PROTIME-INR
INR: 1.1 (ref 0.8–1.2)
Prothrombin Time: 13.5 seconds (ref 11.4–15.2)

## 2020-09-02 NOTE — Progress Notes (Signed)
PROGRESS NOTE    Henry May  FVC:[id number] DOB: 05-Jul-1966 DOA: 08/06/2020 PCP: Patient, No Pcp Per  Brief Narrative:  54/M with no significant past medical history presented in the ED with increasing shortness of breath.  He was diagnosed with COVID-19 on 8/12 and then has worsening shortness of breath.  He was admitted on 8/22 for acute respiratory failure secondary to Covid pneumonia requiring high flow oxygen and BiPAP.  He was transferred to the ICU for further care.  He received treatment for Covid pneumonia including remdesivir, Baricitinib and IV steroids.  He was transferred out of the ICU on 9/3.  He then developed lower extremity DVT, was started on heparin. Later  developed large retroperitoneal hematoma and hemorrhagic shock resulting in a brief PEA arrest.  Achieved ROSC, was intubated, started on Levophed and transferred back to the ICU, transfused PRBC.  he underwent IVC filter placement, anticoagulation discontinued.  He was successfully extubated 9/9, off pressors .  Still on high flow nasal cannula at 10-12 L sats 94%.  still quite short of breath with activity.  He is off airborne isolation.  Assessment & Plan:  Acute hypoxemic respiratory failure/COVID-19 pneumonia :  -required very high concentrations of high flow oxygen and BiPAP,  admitted in the ICU, was very tenuous tenuous initially on BIPAP and then stabilized -This was followed by PEA arrest in the setting of hemorrhagic shock and large retroperitoneal hematoma requiring pressor support and mechanical ventilation, successfully extubated 9/9, to HFNC @ 13-14L -He has completed treatment for Covid pneumonia, including remdesivir, baricitinib and has been on a prolonged course of IV Solu-Medrol, then tapered, and finally discontinued -Encouraged ambulation in the room, proning as tolerated -Incentive spirometry -Continue to wean down O2 today -Discharge planning, hopeful for home with home health services on  Monday  PEA arrest Hemorrhagic shock: resolved   Status post cardiac arrest, PEA lasted about 4 minutes, secondary to hemorrhagic shock from a massive retroperitoneal bleed -Required 2 units PRBC/ 2 FFP -Was transferred to the ICU and transferred out subsequently -Hb stable  Acute retroperitoneal bleed  Has developed massive left retroperitoneal bleed 13 x 11 cm Required 2 units PRBC , 2 FFPS.  -Hemoglobin stable  Thrombocytopenia - Resolved.  Leukocytosis - Improving. -Continue to monitor  CHF with normal ejection fraction: -continue metoprolol.  Acute kidney injury - Resolved  -Renally dose medications -Maintain euvolemia. -Avoid nephrotoxins  Hyperkalemia : >> Resolved. -Treated with Lokelma last week  Bilateral lower extremity DVT. -Post IVC filter 9/9. -Hold anticoagulation.  QT prolongation -Continue to monitor.  Sinus tachycardia:  resolved.  Essential hypertension -Continue beta-blockade.  Elevated transaminases, hyperbilirubinemia -Likely due to acute illness.  Hyperbilirubinemia may be due to degraded blood cells in RP space. - con't to monitor  - LFTS trended down.  DVT prophylaxis: SCDs Code Status: Full Family Communication: Wife was at bed side. Disposition Plan:  Status is: Inpatient  Remains inpatient appropriate because:Inpatient level of care appropriate due to severity of illness   Dispo: The patient is from: Home              Anticipated d/c is to: Home              Anticipated d/c date is: Pending improvement in dyspnea and oxygen requirement, possibly monday              Patient currently is not medically stable to d/c.   Consultants:   PCCM   Procedures: Intubated, extubated, central lines.  Antimicrobials:  Anti-infectives (From admission, onward)   Start     Dose/Rate Route Frequency Ordered Stop   08/24/20 2200  ceFEPIme (MAXIPIME) 2 g in sodium chloride 0.9 % 100 mL IVPB  Status:  Discontinued        2 g 200  mL/hr over 30 Minutes Intravenous Every 12 hours 08/24/20 0813 08/24/20 0828   08/24/20 0900  vancomycin (VANCOREADY) IVPB 1750 mg/350 mL  Status:  Discontinued        1,750 mg 175 mL/hr over 120 Minutes Intravenous  Once 08/24/20 0813 08/24/20 0828   08/23/20 0400  ceFEPIme (MAXIPIME) 2 g in sodium chloride 0.9 % 100 mL IVPB  Status:  Discontinued        2 g 200 mL/hr over 30 Minutes Intravenous Every 24 hours 08/23/20 0256 08/24/20 0813   08/23/20 0400  vancomycin (VANCOREADY) IVPB 1750 mg/350 mL        1,750 mg 175 mL/hr over 120 Minutes Intravenous  Once 08/23/20 0256 08/23/20 0740   08/23/20 0345  metroNIDAZOLE (FLAGYL) IVPB 500 mg  Status:  Discontinued        500 mg 100 mL/hr over 60 Minutes Intravenous Every 8 hours 08/23/20 0249 08/24/20 0828   08/23/20 0256  vancomycin variable dose per unstable renal function (pharmacist dosing)  Status:  Discontinued         Does not apply See admin instructions 08/23/20 0256 08/24/20 0828   08/09/20 0600  vancomycin (VANCOREADY) IVPB 750 mg/150 mL  Status:  Discontinued        750 mg 150 mL/hr over 60 Minutes Intravenous Every 12 hours 08/08/20 1342 08/09/20 1153   08/08/20 0600  vancomycin (VANCOREADY) IVPB 1250 mg/250 mL  Status:  Discontinued        1,250 mg 166.7 mL/hr over 90 Minutes Intravenous Every 24 hours 08/07/20 0602 08/08/20 1342   08/08/20 0000  cefTRIAXone (ROCEPHIN) 2 g in sodium chloride 0.9 % 100 mL IVPB  Status:  Discontinued        2 g 200 mL/hr over 30 Minutes Intravenous Every 24 hours 08/07/20 0119 08/07/20 0535   08/07/20 1000  remdesivir 100 mg in sodium chloride 0.9 % 100 mL IVPB       "Followed by" Linked Group Details   100 mg 200 mL/hr over 30 Minutes Intravenous Every 24 hours 08/06/20 1931 08/10/20 1130   08/07/20 0630  ceFEPIme (MAXIPIME) 2 g in sodium chloride 0.9 % 100 mL IVPB        2 g 200 mL/hr over 30 Minutes Intravenous Every 8 hours 08/07/20 0602 08/13/20 2155   08/07/20 0630  vancomycin (VANCOREADY)  IVPB 2000 mg/400 mL        2,000 mg 200 mL/hr over 120 Minutes Intravenous  Once 08/07/20 0602 08/07/20 0846   08/07/20 0200  azithromycin (ZITHROMAX) 500 mg in sodium chloride 0.9 % 250 mL IVPB  Status:  Discontinued        500 mg 250 mL/hr over 60 Minutes Intravenous Every 24 hours 08/07/20 0119 08/07/20 0535   08/07/20 0130  cefTRIAXone (ROCEPHIN) 2 g in sodium chloride 0.9 % 100 mL IVPB        2 g 200 mL/hr over 30 Minutes Intravenous  Once 08/07/20 0124 08/07/20 0251   08/06/20 2000  remdesivir 200 mg in sodium chloride 0.9% 250 mL IVPB       "Followed by" Linked Group Details   200 mg 580 mL/hr over 30 Minutes Intravenous Once 08/06/20 1931 08/07/20 0308  Subjective: -Feels better overall, breathing is continuing to improve, down to 5 L this morning  Objective: Vitals:   09/01/20 2332 09/02/20 0356 09/02/20 0426 09/02/20 0801  BP: 111/66 130/84  124/81  Pulse: 69 70  78  Resp: 19 (!) 23  (!) 24  Temp: 98 F (36.7 C) 97.8 F (36.6 C)  97.7 F (36.5 C)  TempSrc: Axillary Axillary  Axillary  SpO2: 98% 94%  95%  Weight:   87.1 kg   Height:        Intake/Output Summary (Last 24 hours) at 09/02/2020 1135 Last data filed at 09/02/2020 0805 Gross per 24 hour  Intake 240 ml  Output 2275 ml  Net -2035 ml   Filed Weights   08/31/20 0359 09/01/20 0325 09/02/20 0426  Weight: 89 kg 87.2 kg 87.1 kg    Examination:  General exam: Pleasant middle-aged male, sitting up in bed, AAOx3, no distress CVS: S1-S2, regular rhythm Lungs: Few bilateral rales Abdomen: Soft, nontender, bowel sounds present extremities: No edema  Skin: No rashes on exposed skin  Psych, appropriate mood and affect   Data Reviewed: I have personally reviewed following labs and imaging studies  CBC: Recent Labs  Lab 08/28/20 0512 08/29/20 0902 08/30/20 0630 08/31/20 0830 09/01/20 0521  WBC 13.4* 12.0* 10.8* 12.3* 11.1*  HGB 10.6* 10.8* 10.8* 11.8* 10.9*  HCT 34.0* 34.8* 34.3* 37.2* 33.7*   MCV 92.1 90.9 90.3 90.5 90.3  PLT 202 247 262 306 288   Basic Metabolic Panel: Recent Labs  Lab 08/28/20 0512 08/28/20 0512 08/28/20 0930 08/29/20 0902 08/30/20 0630 08/31/20 0830 09/01/20 0521  NA 138  --   --  132* 135 136 134*  K 5.6*   < > 5.6* 4.4 4.8 4.3 4.4  CL 103  --   --  97* 98 97* 102  CO2 26  --   --  27 30 28 29   GLUCOSE 115*  --   --  181* 78 126* 110*  BUN 34*  --   --  29* 26* 27* 24*  CREATININE 1.09  --   --  1.02 0.98 1.00 0.88  CALCIUM 8.6*  --   --  8.5* 8.7* 9.0 8.3*  MG 2.1  --   --  1.8  --   --   --   PHOS 4.6  --   --  3.1  --   --   --    < > = values in this interval not displayed.   GFR: Estimated Creatinine Clearance: 102.9 mL/min (by C-G formula based on SCr of 0.88 mg/dL). Liver Function Tests: Recent Labs  Lab 08/27/20 0031 08/28/20 0512 08/29/20 0902  AST 175* 93* 70*  ALT 1,551* 1,018* 692*  ALKPHOS 124 118 119  BILITOT 0.9 0.9 1.2  PROT 5.8* 5.9* 5.9*  ALBUMIN 2.5* 2.5* 2.5*   No results for input(s): LIPASE, AMYLASE in the last 168 hours. No results for input(s): AMMONIA in the last 168 hours. Coagulation Profile: Recent Labs  Lab 08/29/20 0902 08/30/20 0630 08/31/20 0830 09/01/20 0521 09/02/20 0410  INR 1.2 1.1 1.1 1.1 1.1   Cardiac Enzymes: No results for input(s): CKTOTAL, CKMB, CKMBINDEX, TROPONINI in the last 168 hours. BNP (last 3 results) No results for input(s): PROBNP in the last 8760 hours. HbA1C: No results for input(s): HGBA1C in the last 72 hours. CBG: Recent Labs  Lab 08/29/20 2328 08/30/20 0439 08/30/20 0740 08/30/20 1154 08/30/20 1602  GLUCAP 100* 78 71 104* 139*  Lipid Profile: No results for input(s): CHOL, HDL, LDLCALC, TRIG, CHOLHDL, LDLDIRECT in the last 72 hours. Thyroid Function Tests: No results for input(s): TSH, T4TOTAL, FREET4, T3FREE, THYROIDAB in the last 72 hours. Anemia Panel: Recent Labs    09/01/20 0521  FERRITIN 1,565*   Sepsis Labs: No results for input(s):  PROCALCITON, LATICACIDVEN in the last 168 hours.  No results found for this or any previous visit (from the past 240 hour(s)).   Radiology Studies: No results found.  Scheduled Meds: . Chlorhexidine Gluconate Cloth  6 each Topical Daily  . feeding supplement (ENSURE ENLIVE)  237 mL Oral TID BM  . influenza vac split quadrivalent PF  0.5 mL Intramuscular Tomorrow-1000  . mouth rinse  15 mL Mouth Rinse BID  . melatonin  5 mg Oral QHS  . metoprolol tartrate  25 mg Oral BID  . multivitamin with minerals  1 tablet Oral Daily  . pantoprazole  40 mg Oral QHS  . polyethylene glycol  17 g Oral BID  . senna  2 tablet Oral BID AC & HS   Continuous Infusions: . sodium chloride Stopped (08/24/20 1800)  . sodium chloride Stopped (08/24/20 1800)     LOS: 27 days   Time spent: 25 mins. Zannie Cove, MD [facility]

## 2020-09-03 LAB — BASIC METABOLIC PANEL
Anion gap: 8 (ref 5–15)
BUN: 21 mg/dL — ABNORMAL HIGH (ref 6–20)
CO2: 27 mmol/L (ref 22–32)
Calcium: 8.7 mg/dL — ABNORMAL LOW (ref 8.9–10.3)
Chloride: 99 mmol/L (ref 98–111)
Creatinine, Ser: 0.88 mg/dL (ref 0.61–1.24)
GFR calc Af Amer: >60 mL/min (ref >60)
GFR calc non Af Amer: >60 mL/min (ref >60)
Glucose, Bld: 100 mg/dL — ABNORMAL HIGH (ref 70–99)
Potassium: 4.2 mmol/L (ref 3.5–5.1)
Sodium: 134 mmol/L — ABNORMAL LOW (ref 135–145)

## 2020-09-03 LAB — CBC
HCT: 38 % — ABNORMAL LOW (ref 39.0–52.0)
Hemoglobin: 12.2 g/dL — ABNORMAL LOW (ref 13.0–17.0)
MCH: 29.2 pg (ref 26.0–34.0)
MCHC: 32.1 g/dL (ref 30.0–36.0)
MCV: 90.9 fL (ref 80.0–100.0)
Platelets: 353 10*3/uL (ref 150–400)
RBC: 4.18 MIL/uL — ABNORMAL LOW (ref 4.22–5.81)
RDW: 17.3 % — ABNORMAL HIGH (ref 11.5–15.5)
WBC: 10.6 10*3/uL — ABNORMAL HIGH (ref 4.0–10.5)
nRBC: 0 % (ref 0.0–0.2)

## 2020-09-03 LAB — PROTIME-INR
INR: 1.1 (ref 0.8–1.2)
Prothrombin Time: 14.1 seconds (ref 11.4–15.2)

## 2020-09-03 NOTE — Progress Notes (Signed)
PROGRESS NOTE    Shahzain Kiester  FAO:[id number] DOB: [birth year] DOA: 08/06/2020 PCP: Patient, No Pcp Per  Brief Narrative:  54/M with no significant past medical history presented in the ED with increasing shortness of breath.  He was diagnosed with COVID-19 on 8/12 and then has worsening shortness of breath.  He was admitted on 8/22 for acute respiratory failure secondary to Covid pneumonia requiring high flow oxygen and BiPAP.  He was transferred to the ICU for further care.  He received treatment for Covid pneumonia including remdesivir, Baricitinib and IV steroids.  He was transferred out of the ICU on 9/3.  He then developed lower extremity DVT, was started on heparin. Later  developed large retroperitoneal hematoma and hemorrhagic shock resulting in a brief PEA arrest.  Achieved ROSC, was intubated, started on Levophed and transferred back to the ICU, transfused PRBC.  he underwent IVC filter placement, anticoagulation discontinued.  He was successfully extubated 9/9, off pressors .  was on high flow nasal cannula at 10-12 L sats 94%.  still quite short of breath with activity.  He is off airborne isolation. -Oxygen requirement slowly improving  Assessment & Plan:  Acute hypoxemic respiratory failure/COVID-19 pneumonia :  -required very high concentrations of high flow oxygen and BiPAP,  admitted in the ICU, was very tenuous tenuous initially on BIPAP and then stabilized -This was followed by PEA arrest in the setting of hemorrhagic shock and large retroperitoneal hematoma requiring pressor support and mechanical ventilation, successfully extubated 9/9, to HFNC @ 13-14L -He has completed treatment for Covid pneumonia, including remdesivir, baricitinib and has been on a prolonged course of IV Solu-Medrol, then tapered, and finally discontinued -Encouraged ambulation in the room, proning as tolerated -Incentive spirometry -Continue to wean O2, ambulate with PT -Plan for DC home with home health  services, home O2 tomorrow  PEA arrest Hemorrhagic shock: resolved   Status post cardiac arrest, PEA lasted about 4 minutes, secondary to hemorrhagic shock from a massive retroperitoneal bleed -Required 2 units PRBC/ 2 FFP -Was transferred to the ICU and transferred out subsequently -Hb stable  Acute retroperitoneal bleed  Has developed massive left retroperitoneal bleed 13 x 11 cm Required 2 units PRBC , 2 FFPS.  -Hemoglobin stable  Thrombocytopenia - Resolved.  Leukocytosis - Improving. -Continue to monitor  Acute kidney injury - Resolved  -Renally dose medications -Maintain euvolemia. -Avoid nephrotoxins  Hyperkalemia : >> Resolved. -Treated with Lokelma last week  Bilateral lower extremity DVT. -Post IVC filter 9/9. -Stopped anticoagulation.  QT prolongation -Continue to monitor.  Sinus tachycardia:  resolved.  Essential hypertension -Continue beta-blockade.  Elevated transaminases, hyperbilirubinemia -Likely due to acute illness.  Hyperbilirubinemia may be due to degraded blood cells in RP space. - con't to monitor  - LFTS trended down.  DVT prophylaxis: SCDs Code Status: Full Family Communication: Wife at bed side. Disposition Plan:  Status is: Inpatient  Remains inpatient appropriate because:Inpatient level of care appropriate due to severity of illness   Dispo: The patient is from: Home              Anticipated d/c is to: Home              Anticipated d/c date is: Likely tomorrow              Patient currently is not medically stable to d/c.   Consultants:   PCCM   Procedures: Intubated, extubated, central lines.  Antimicrobials: Anti-infectives (From admission, onward)   Start  Dose/Rate Route Frequency Ordered Stop   08/24/20 2200  ceFEPIme (MAXIPIME) 2 g in sodium chloride 0.9 % 100 mL IVPB  Status:  Discontinued        2 g 200 mL/hr over 30 Minutes Intravenous Every 12 hours 08/24/20 0813 08/24/20 0828   08/24/20 0900   vancomycin (VANCOREADY) IVPB 1750 mg/350 mL  Status:  Discontinued        1,750 mg 175 mL/hr over 120 Minutes Intravenous  Once 08/24/20 0813 08/24/20 0828   08/23/20 0400  ceFEPIme (MAXIPIME) 2 g in sodium chloride 0.9 % 100 mL IVPB  Status:  Discontinued        2 g 200 mL/hr over 30 Minutes Intravenous Every 24 hours 08/23/20 0256 08/24/20 0813   08/23/20 0400  vancomycin (VANCOREADY) IVPB 1750 mg/350 mL        1,750 mg 175 mL/hr over 120 Minutes Intravenous  Once 08/23/20 0256 08/23/20 0740   08/23/20 0345  metroNIDAZOLE (FLAGYL) IVPB 500 mg  Status:  Discontinued        500 mg 100 mL/hr over 60 Minutes Intravenous Every 8 hours 08/23/20 0249 08/24/20 0828   08/23/20 0256  vancomycin variable dose per unstable renal function (pharmacist dosing)  Status:  Discontinued         Does not apply See admin instructions 08/23/20 0256 08/24/20 0828   08/09/20 0600  vancomycin (VANCOREADY) IVPB 750 mg/150 mL  Status:  Discontinued        750 mg 150 mL/hr over 60 Minutes Intravenous Every 12 hours 08/08/20 1342 08/09/20 1153   08/08/20 0600  vancomycin (VANCOREADY) IVPB 1250 mg/250 mL  Status:  Discontinued        1,250 mg 166.7 mL/hr over 90 Minutes Intravenous Every 24 hours 08/07/20 0602 08/08/20 1342   08/08/20 0000  cefTRIAXone (ROCEPHIN) 2 g in sodium chloride 0.9 % 100 mL IVPB  Status:  Discontinued        2 g 200 mL/hr over 30 Minutes Intravenous Every 24 hours 08/07/20 0119 08/07/20 0535   08/07/20 1000  remdesivir 100 mg in sodium chloride 0.9 % 100 mL IVPB       "Followed by" Linked Group Details   100 mg 200 mL/hr over 30 Minutes Intravenous Every 24 hours 08/06/20 1931 08/10/20 1130   08/07/20 0630  ceFEPIme (MAXIPIME) 2 g in sodium chloride 0.9 % 100 mL IVPB        2 g 200 mL/hr over 30 Minutes Intravenous Every 8 hours 08/07/20 0602 08/13/20 2155   08/07/20 0630  vancomycin (VANCOREADY) IVPB 2000 mg/400 mL        2,000 mg 200 mL/hr over 120 Minutes Intravenous  Once 08/07/20  0602 08/07/20 0846   08/07/20 0200  azithromycin (ZITHROMAX) 500 mg in sodium chloride 0.9 % 250 mL IVPB  Status:  Discontinued        500 mg 250 mL/hr over 60 Minutes Intravenous Every 24 hours 08/07/20 0119 08/07/20 0535   08/07/20 0130  cefTRIAXone (ROCEPHIN) 2 g in sodium chloride 0.9 % 100 mL IVPB        2 g 200 mL/hr over 30 Minutes Intravenous  Once 08/07/20 0124 08/07/20 0251   08/06/20 2000  remdesivir 200 mg in sodium chloride 0.9% 250 mL IVPB       "Followed by" Linked Group Details   200 mg 580 mL/hr over 30 Minutes Intravenous Once 08/06/20 1931 08/07/20 0308      Subjective: -Continues to feel better overall, oxygen down  to 4 L this morning  Objective: Vitals:   09/03/20 0028 09/03/20 0346 09/03/20 0449 09/03/20 0815  BP: 106/75   103/79  Pulse: 66   85  Resp: (!) 22   20  Temp: 98.3 F (36.8 C)  98.3 F (36.8 C) (!) 97.4 F (36.3 C)  TempSrc: Axillary  Axillary Oral  SpO2: 93%     Weight:  88.6 kg    Height:        Intake/Output Summary (Last 24 hours) at 09/03/2020 1113 Last data filed at 09/03/2020 0700 Gross per 24 hour  Intake 240 ml  Output 800 ml  Net -560 ml   Filed Weights   09/01/20 0325 09/02/20 0426 09/03/20 0346  Weight: 87.2 kg 87.1 kg 88.6 kg    Examination:  General exam: Pleasant middle-aged male sitting up in bed, AAOx3, no distress HEENT: No JVD CVS: S1-S2, regular rate rhythm Lungs: Few basilar rales Abdomen: Soft, nontender, bowel sounds present Extremities: No edema  Skin: No rashes on exposed skin  Psych, appropriate mood and affect   Data Reviewed: I have personally reviewed following labs and imaging studies  CBC: Recent Labs  Lab 08/29/20 0902 08/30/20 0630 08/31/20 0830 09/01/20 0521 09/03/20 0836  WBC 12.0* 10.8* 12.3* 11.1* 10.6*  HGB 10.8* 10.8* 11.8* 10.9* 12.2*  HCT 34.8* 34.3* 37.2* 33.7* 38.0*  MCV 90.9 90.3 90.5 90.3 90.9  PLT 247 262 306 288 353   Basic Metabolic Panel: Recent Labs  Lab  08/28/20 0512 08/28/20 0930 08/29/20 0902 08/30/20 0630 08/31/20 0830 09/01/20 0521 09/03/20 0836  NA 138  --  132* 135 136 134* 134*  K 5.6*   < > 4.4 4.8 4.3 4.4 4.2  CL 103  --  97* 98 97* 102 99  CO2 26  --  27 30 28 29 27   GLUCOSE 115*  --  181* 78 126* 110* 100*  BUN 34*  --  29* 26* 27* 24* 21*  CREATININE 1.09  --  1.02 0.98 1.00 0.88 0.88  CALCIUM 8.6*  --  8.5* 8.7* 9.0 8.3* 8.7*  MG 2.1  --  1.8  --   --   --   --   PHOS 4.6  --  3.1  --   --   --   --    < > = values in this interval not displayed.   GFR: Estimated Creatinine Clearance: 102.9 mL/min (by C-G formula based on SCr of 0.88 mg/dL). Liver Function Tests: Recent Labs  Lab 08/28/20 0512 08/29/20 0902  AST 93* 70*  ALT 1,018* 692*  ALKPHOS 118 119  BILITOT 0.9 1.2  PROT 5.9* 5.9*  ALBUMIN 2.5* 2.5*   No results for input(s): LIPASE, AMYLASE in the last 168 hours. No results for input(s): AMMONIA in the last 168 hours. Coagulation Profile: Recent Labs  Lab 08/30/20 0630 08/31/20 0830 09/01/20 0521 09/02/20 0410 09/03/20 0312  INR 1.1 1.1 1.1 1.1 1.1   Cardiac Enzymes: No results for input(s): CKTOTAL, CKMB, CKMBINDEX, TROPONINI in the last 168 hours. BNP (last 3 results) No results for input(s): PROBNP in the last 8760 hours. HbA1C: No results for input(s): HGBA1C in the last 72 hours. CBG: Recent Labs  Lab 08/29/20 2328 08/30/20 0439 08/30/20 0740 08/30/20 1154 08/30/20 1602  GLUCAP 100* 78 71 104* 139*   Lipid Profile: No results for input(s): CHOL, HDL, LDLCALC, TRIG, CHOLHDL, LDLDIRECT in the last 72 hours. Thyroid Function Tests: No results for input(s): TSH, T4TOTAL, FREET4, T3FREE,  THYROIDAB in the last 72 hours. Anemia Panel: Recent Labs    09/01/20 0521  FERRITIN 1,565*   Sepsis Labs: No results for input(s): PROCALCITON, LATICACIDVEN in the last 168 hours.  No results found for this or any previous visit (from the past 240 hour(s)).   Radiology Studies: No  results found.  Scheduled Meds: . Chlorhexidine Gluconate Cloth  6 each Topical Daily  . feeding supplement (ENSURE ENLIVE)  237 mL Oral TID BM  . influenza vac split quadrivalent PF  0.5 mL Intramuscular Tomorrow-1000  . mouth rinse  15 mL Mouth Rinse BID  . melatonin  5 mg Oral QHS  . metoprolol tartrate  25 mg Oral BID  . multivitamin with minerals  1 tablet Oral Daily  . pantoprazole  40 mg Oral QHS  . polyethylene glycol  17 g Oral BID  . senna  2 tablet Oral BID AC & HS   Continuous Infusions: . sodium chloride Stopped (08/24/20 1800)  . sodium chloride Stopped (08/24/20 1800)     LOS: 28 days   Time spent: 25 mins. Zannie Cove, MD [facility]

## 2020-09-04 LAB — PROTIME-INR
INR: 1.1 (ref 0.8–1.2)
Prothrombin Time: 13.9 seconds (ref 11.4–15.2)

## 2020-09-04 MED ORDER — PANTOPRAZOLE SODIUM 40 MG PO TBEC
40.0000 mg | DELAYED_RELEASE_TABLET | Freq: Every day | ORAL | 0 refills | Status: DC
Start: 2020-09-04 — End: 2020-10-16

## 2020-09-04 MED ORDER — SENNA 8.6 MG PO TABS: 2.0000 | ORAL_TABLET | Freq: Every day | ORAL | 0 refills | Status: DC | PRN

## 2020-09-04 MED ORDER — METOPROLOL TARTRATE 25 MG PO TABS
25.0000 mg | ORAL_TABLET | Freq: Two times a day (BID) | ORAL | 0 refills | Status: DC
Start: 2020-09-04 — End: 2020-11-07

## 2020-09-04 NOTE — Discharge Summary (Signed)
Physician Discharge Summary  Henry May WJX:[id number] DOB: 09/18/1966 DOA: 08/06/2020  PCP: Patient, No Pcp Per  Admit date: 08/06/2020 Discharge date: 09/04/2020  Time spent: 35 minutes  Recommendations for Outpatient Follow-up:  1. PCP in 1 to 2 weeks 2. Home health PT, home O2   Discharge Diagnoses:  Principal Problem: Acute respiratory failure due to COVID-19 [facility](HCC) PEA arrest Hemorrhagic shock Acute massive retroperitoneal bleed Acute blood loss anemia ARF (acute renal failure) (HCC) Hyperkalemia Elevated LFTs   Acute respiratory disease due to COVID-19 virus   Pneumonia due to COVID-19 virus   Acute hypoxemic respiratory failure due to COVID-19 [facility](HCC)   Leg DVT (deep venous thromboembolism), acute, bilateral (HCC)   Chest pain   Discharge Condition: Stable  Diet recommendation: Regular  Filed Weights   09/01/20 0325 09/02/20 0426 09/03/20 0346  Weight: 87.2 kg 87.1 kg 88.6 kg    History of present illness:  54/M with no significant past medical history presented in the ED with increasing shortness of breath.  He was diagnosed with COVID-19 on 8/12 and then has worsening shortness of breath.  He was admitted on 8/22 for acute respiratory failure secondary to Covid pneumonia requiring high flow oxygen and BiPAP.  Hospital Course:   Acute hypoxemic respiratory failure/COVID-19 pneumonia :  -required very high concentrations of high flow oxygen and BiPAP,  admitted in the ICU, was very tenuous tenuous initially on BIPAP and then stabilized -This was followed by PEA arrest on 9/7 in the setting of hemorrhagic shock and large retroperitoneal hematoma requiring pressor support and mechanical ventilation, successfully extubated 9/9, to HFNC @ 13-14L -He has completed treatment for Covid pneumonia, including remdesivir, baricitinib and has been on a prolonged course of IV Solu-Medrol, then tapered, and finally discontinued -Slowly improved clinically subsequently, now  weaned down to  3 L O2 at rest, will need more with activity -Discharged home with spouse today on home O2, home health PT set up and primary care follow-up  PEA arrest Hemorrhagic shock: resolved   -On 9/7 suffered a brief PEA arrest , lasted about 4 minutes, secondary to hemorrhagic shock from a massive retroperitoneal bleed -Was transferred to the ICU intubated, stabilized, transfused and transferred out subsequently -Required 2 units PRBC/ 2 FFP -Hb stable now  Acute retroperitoneal bleed  Has developed massive left retroperitoneal bleed 13 x 11 cm Required 2 units PRBC , 2 FFPS.  -Hemoglobin stable since then -Heparin and other anticoagulants discontinued  Thrombocytopenia - Resolved.  Leukocytosis  -Reactive, improving  Acute kidney injury  - Resolved   Hyperkalemia : >> Resolved. -Treated with Lokelma last week  Bilateral lower extremity DVT. -Post IVC filter 9/9. -Stopped anticoagulation due to massive retroperitoneal bleeding  QT prolongation -Continue to monitor.  Sinus tachycardia:  resolved.  Essential hypertension -Continue beta-blockade.  Elevated transaminases, hyperbilirubinemia-Likely due to acute illness.  Hyperbilirubinemia may be due to degraded blood cells in RP space. - LFTS trended down.   Procedures:  IVC filter placement 9/9  Consultations:  PCCM  Discharge Exam: Vitals:   09/04/20 0453 09/04/20 0735  BP: 120/75 116/74  Pulse: 77 88  Resp: (!) 22 20  Temp: 98.2 F (36.8 C) (!) 97.5 F (36.4 C)  SpO2: 96% 97%    General: Awake alert oriented x3 Cardiovascular: S1-S2, regular rate rhythm Respiratory: Improved air movement, scattered bilateral rales  Discharge Instructions   Discharge Instructions    Diet - low sodium heart healthy   Complete by: As directed    Increase  activity slowly   Complete by: As directed    No wound care   Complete by: As directed      Allergies as of 09/04/2020   No Known  Allergies     Medication List    STOP taking these medications   ampicillin 500 MG capsule Commonly known as: PRINCIPEN   aspirin EC 81 MG tablet   naproxen sodium 220 MG tablet Commonly known as: ALEVE     TAKE these medications   acetaminophen 325 MG tablet Commonly known as: TYLENOL Take 325-650 mg by mouth every 6 (six) hours as needed for mild pain, fever or headache.   metoprolol tartrate 25 MG tablet Commonly known as: LOPRESSOR Take 1 tablet (25 mg total) by mouth 2 (two) times daily.   pantoprazole 40 MG tablet Commonly known as: PROTONIX Take 1 tablet (40 mg total) by mouth at bedtime.   senna 8.6 MG Tabs tablet Commonly known as: SENOKOT Take 2 tablets (17.2 mg total) by mouth daily as needed for mild constipation.            Durable Medical Equipment  (From admission, onward)         Start     Ordered   Unscheduled  For home use only DME oxygen  Once       Question Answer Comment  Length of Need 6 Months   Mode or (Route) Nasal cannula   Frequency Continuous (stationary and portable oxygen unit needed)   Oxygen conserving device Yes   Oxygen delivery system Gas      09/04/20 1140         No Known Allergies  Follow-up Information    [facility] Follow up.   Contact information: [address] [place] [address] [phone]               The results of significant diagnostics from this hospitalization (including imaging, microbiology, ancillary and laboratory) are listed below for reference.    Significant Diagnostic Studies: CT ABDOMEN PELVIS WO CONTRAST  Addendum Date: 08/23/2020   ADDENDUM REPORT: 08/23/2020 03:14 ADDENDUM: Critical Value/emergent results were called by telephone at the time of interpretation on 08/23/2020 at 3:14 am to provider [facility] , who verbally acknowledged these results. Electronically Signed   By: Charlett Nose M.D.   On: 08/23/2020 03:14    Result Date: 08/23/2020 CLINICAL DATA:  Respiratory failure. Possible intra-abdominal hemorrhage. Recent COVID positive. EXAM: CT CHEST, ABDOMEN AND PELVIS WITHOUT CONTRAST TECHNIQUE: Multidetector CT imaging of the chest, abdomen and pelvis was performed following the standard protocol without IV contrast. COMPARISON:  None. FINDINGS: CT CHEST FINDINGS Cardiovascular: Heart is normal size.  Aorta normal caliber. Mediastinum/Nodes: Endotracheal tube tip is in the lower trachea. There is pneumomediastinum throughout the mediastinum and extending in to the lower neck. No adenopathy. Lungs/Pleura: Small right apical pneumothorax. Extensive bilateral ground-glass airspace disease. No effusions. Musculoskeletal: Chest wall soft tissues are unremarkable. No acute bony abnormality. CT ABDOMEN PELVIS FINDINGS Hepatobiliary: No focal hepatic abnormality. Gallbladder unremarkable. Pancreas: No focal abnormality or ductal dilatation. Spleen: No focal abnormality.  Normal size. Adrenals/Urinary Tract: Small bilateral nonobstructing renal stones. No ureteral stones or hydronephrosis. Foley catheter is present in the bladder which is decompressed. Adrenals unremarkable. Stomach/Bowel: Stomach, large and small bowel grossly unremarkable. NG tube is in the distal stomach. Vascular/Lymphatic: No evidence of aneurysm or adenopathy. Reproductive: No visible focal abnormality. Other: No free fluid or free air. Large heterogeneous area  noted within the left retroperitoneum involving the left psoas and iliopsoas muscles compatible with retroperitoneal hematoma. This measures approximately 12.8 x 9.3 cm on axial imaging and extends from posterior to the left kidney down near the left groin region. Musculoskeletal: No acute bony abnormality. IMPRESSION: Extensive ground-glass opacities throughout the lungs most compatible with pneumonia, likely COVID pneumonia. Pneumomediastinum, extending into the lower neck soft tissues. Small right  apical pneumothorax. Large left retroperitoneal hematoma. Bilateral small nonobstructing renal stones.  No hydronephrosis. Attempts are being made to contact the ordering physician or physician on-call. Electronically Signed: By: Charlett Nose M.D. On: 08/23/2020 03:03   DG Chest 1 View  Result Date: 08/22/2020 CLINICAL DATA:  Intubation EXAM: CHEST  1 VIEW COMPARISON:  08/22/2020 FINDINGS: Endotracheal tube is 3.5 cm above the carina. Multifocal airspace opacities throughout the lungs are similar prior study. Heart is borderline in size. No effusions or pneumothorax. IMPRESSION: Endotracheal tube 3.5 cm above the carina. Otherwise no significant change. Electronically Signed   By: Charlett Nose M.D.   On: 08/22/2020 22:06   CT CHEST WO CONTRAST  Addendum Date: 08/23/2020   ADDENDUM REPORT: 08/23/2020 03:14 ADDENDUM: Critical Value/emergent results were called by telephone at the time of interpretation on 08/23/2020 at 3:14 am to provider [facility] , who verbally acknowledged these results. Electronically Signed   By: Charlett Nose M.D.   On: 08/23/2020 03:14   Result Date: 08/23/2020 CLINICAL DATA:  Respiratory failure. Possible intra-abdominal hemorrhage. Recent COVID positive. EXAM: CT CHEST, ABDOMEN AND PELVIS WITHOUT CONTRAST TECHNIQUE: Multidetector CT imaging of the chest, abdomen and pelvis was performed following the standard protocol without IV contrast. COMPARISON:  None. FINDINGS: CT CHEST FINDINGS Cardiovascular: Heart is normal size.  Aorta normal caliber. Mediastinum/Nodes: Endotracheal tube tip is in the lower trachea. There is pneumomediastinum throughout the mediastinum and extending in to the lower neck. No adenopathy. Lungs/Pleura: Small right apical pneumothorax. Extensive bilateral ground-glass airspace disease. No effusions. Musculoskeletal: Chest wall soft tissues are unremarkable. No acute bony abnormality. CT ABDOMEN PELVIS FINDINGS Hepatobiliary: No focal hepatic abnormality.  Gallbladder unremarkable. Pancreas: No focal abnormality or ductal dilatation. Spleen: No focal abnormality.  Normal size. Adrenals/Urinary Tract: Small bilateral nonobstructing renal stones. No ureteral stones or hydronephrosis. Foley catheter is present in the bladder which is decompressed. Adrenals unremarkable. Stomach/Bowel: Stomach, large and small bowel grossly unremarkable. NG tube is in the distal stomach. Vascular/Lymphatic: No evidence of aneurysm or adenopathy. Reproductive: No visible focal abnormality. Other: No free fluid or free air. Large heterogeneous area noted within the left retroperitoneum involving the left psoas and iliopsoas muscles compatible with retroperitoneal hematoma. This measures approximately 12.8 x 9.3 cm on axial imaging and extends from posterior to the left kidney down near the left groin region. Musculoskeletal: No acute bony abnormality. IMPRESSION: Extensive ground-glass opacities throughout the lungs most compatible with pneumonia, likely COVID pneumonia. Pneumomediastinum, extending into the lower neck soft tissues. Small right apical pneumothorax. Large left retroperitoneal hematoma. Bilateral small nonobstructing renal stones.  No hydronephrosis. Attempts are being made to contact the ordering physician or physician on-call. Electronically Signed: By: Charlett Nose M.D. On: 08/23/2020 03:03   US Abdomen Complete  Result Date: 08/23/2020 CLINICAL DATA:  Elevated LFTs EXAM: ABDOMEN ULTRASOUND COMPLETE COMPARISON:  None. FINDINGS: Gallbladder: No gallstones or wall thickening visualized. Due to the patient's condition ultrasound Murphy's sign could not be elicited. No cholelithiasis is noted. Gallbladder sludge is noted. Common bile duct: Diameter: 2.4 mm. Liver: Generalized increased echogenicity is noted throughout  the liver consistent with fatty infiltration. There are 2 areas of relative decreased attenuation identified within the right lobe of the liver. The largest  of these measures 2.7 cm in greatest dimension. These may represent areas of focal fatty sparing although incompletely evaluated on this exam and not visualized on recent CT due to lack of contrast. Portal vein is patent on color Doppler imaging with normal direction of blood flow towards the liver. IVC: No abnormality visualized. Pancreas: Not well visualized due to overlying bowel gas. Spleen: Size and appearance within normal limits. Right Kidney: Length: 12.3 cm. Echogenicity within normal limits. No mass or hydronephrosis visualized. Left Kidney: Length: 13.1 cm. Echogenicity within normal limits. No mass or hydronephrosis visualized. Abdominal aorta: No aneurysm visualized. Other findings: The known left retroperitoneal hematoma is again identified in the left flank and left pericolic gutter region IMPRESSION: Somewhat limited exam due the patient's current status. Fatty infiltration of the liver with areas of decreased echogenicity in the right lobe of the liver which may represent fatty infiltration. The need for further evaluation can be determined when the patient's clinical picture improves. Changes consistent with the known left retroperitoneal hematoma. Electronically Signed   By: Alcide Clever M.D.   On: 08/23/2020 09:29   IR IVC FILTER PLMT / S&I Lenise Arena GUID/MOD SED  Result Date: 08/24/2020 INDICATION: DVT and pulmonary embolism with contraindication to anticoagulation. Please perform IVC filter for temporary caval interruption purposes. Patient with acute renal insufficiency and as such decision made to proceed with IVC filter placement with CO2 in lieu of contrast. EXAM: ULTRASOUND GUIDANCE FOR VASCULAR ACCESS IVC CATHETERIZATION AND VENOGRAM IVC FILTER INSERTION COMPARISON:  CT abdomen and pelvis-08/23/2020 MEDICATIONS: None. ANESTHESIA/SEDATION: Fentanyl 50 mcg IV Sedation Time: 17 minutes; The patient was continuously monitored during the procedure by the interventional radiology nurse under my  direct supervision. CONTRAST:  CO2 FLUOROSCOPY TIME:  4 minutes (93 mGy) COMPLICATIONS: None immediate. PROCEDURE: Informed written consent was obtained from the patient's family following explanation of the procedure, risks, benefits and alternatives. A time out was performed prior to the initiation of the procedure. Maximal barrier sterile technique utilized including caps, mask, sterile gowns, sterile gloves, large sterile drape, hand hygiene, and Betadine prep. Under sterile condition and local anesthesia, right common femoral venous access was performed with ultrasound. An ultrasound image was saved and sent to PACS. Over a guidewire, the IVC filter delivery sheath and inner dilator were advanced into the IVC just above the IVC bifurcation. CO2 injection was performed for an IVC venogram. Through the delivery sheath, a retrievable Denali IVC filter was deployed below the level of the renal veins and above the IVC bifurcation. The delivery sheath was removed and hemostasis was obtained with manual compression. A dressing was placed. The patient tolerated the procedure well without immediate post procedural complication. FINDINGS: The IVC is patent. No evidence of thrombus, stenosis, or occlusion. No variant venous anatomy. Successful placement of the IVC filter below the level of the renal veins. IMPRESSION: Successful ultrasound and fluoroscopically guided placement of an infrarenal retrievable IVC filter. PLAN: This IVC filter is potentially retrievable. The patient will be assessed for filter retrieval by Interventional Radiology in approximately 8-12 weeks. Further recommendations regarding filter retrieval, continued surveillance or declaration of device permanence, will be made at that time. Electronically Signed   By: Simonne Come M.D.   On: 08/24/2020 14:37   DG CHEST PORT 1 VIEW  Result Date: 08/22/2020 CLINICAL DATA:  Hypoxia. EXAM: PORTABLE CHEST 1 VIEW  COMPARISON:  Chest x-ray dated August 18, 2020. FINDINGS: Stable cardiomediastinal silhouette. Bilateral peripheral basilar predominant interstitial opacities have mildly improved. No pleural effusion or pneumothorax. No acute osseous abnormality. IMPRESSION: 1. Mildly improved multifocal pneumonia. Electronically Signed   By: Obie Dredge M.D.   On: 08/22/2020 20:56   DG CHEST PORT 1 VIEW  Result Date: 08/18/2020 CLINICAL DATA:  Respiratory failure. EXAM: PORTABLE CHEST 1 VIEW COMPARISON:  August 15, 2020. FINDINGS: Stable cardiomegaly. No pneumothorax or pleural effusion is noted. Stable bilateral patchy airspace opacities are noted consistent with multifocal pneumonia. Bony thorax is unremarkable. IMPRESSION: Stable bilateral multifocal pneumonia. Electronically Signed   By: Lupita Raider M.D.   On: 08/18/2020 09:18   DG CHEST PORT 1 VIEW  Result Date: 08/15/2020 CLINICAL DATA:  COVID positive.  Acute respiratory failure. EXAM: PORTABLE CHEST 1 VIEW COMPARISON:  08/12/2020 FINDINGS: Similar appearance of patchy bilateral interstitial and airspace disease, left greater than right. Lung volumes remain low without pleural effusion. Cardiopericardial silhouette is at upper limits of normal for size. The visualized bony structures of the thorax show now acute abnormality. Telemetry leads overlie the chest. IMPRESSION: No substantial change in patchy bilateral airspace disease consistent with multifocal pneumonia. Electronically Signed   By: Kennith Center M.D.   On: 08/15/2020 10:39   DG CHEST PORT 1 VIEW  Result Date: 08/12/2020 CLINICAL DATA:  COVID-19 positive. EXAM: PORTABLE CHEST 1 VIEW COMPARISON:  August 09, 2020 FINDINGS: Bilateral pulmonary infiltrates have worsened in the interval. No pneumothorax. No nodule or mass. No other acute abnormalities. IMPRESSION: Worsening bilateral pulmonary infiltrates. Electronically Signed   By: Gerome Sam III M.D   On: 08/12/2020 15:58   DG CHEST PORT 1 VIEW  Result Date: 08/09/2020 CLINICAL  DATA:  Pneumonia.  COVID-19 positive EXAM: PORTABLE CHEST 1 VIEW COMPARISON:  August 06, 2020 FINDINGS: There is patchy airspace opacity bilaterally, slightly more on the left overall than on the right, stable. No new opacity evident. Heart is mildly enlarged with pulmonary vascularity normal. No adenopathy. No bone lesions. IMPRESSION: Multifocal airspace opacity consistent with multifocal pneumonia, similar to recent study. No frank consolidation. Stable cardiac prominence. No adenopathy evident. Electronically Signed   By: Bretta Bang III M.D.   On: 08/09/2020 08:28   DG Chest Portable 1 View  Result Date: 08/06/2020 CLINICAL DATA:  COVID positive. EXAM: PORTABLE CHEST 1 VIEW COMPARISON:  None. FINDINGS: Mild infiltrates are seen along the periphery of the right lung with mild to moderate severity left basilar infiltrate noted. There is no evidence of a pleural effusion or pneumothorax. The heart size and mediastinal contours are within normal limits. The visualized skeletal structures are unremarkable. IMPRESSION: Mild right lung infiltrates with mild to moderate severity left basilar infiltrate. Electronically Signed   By: Aram Candela M.D.   On: 08/06/2020 19:45   ECHOCARDIOGRAM COMPLETE  Result Date: 08/07/2020    ECHOCARDIOGRAM REPORT   Patient Name:   Henry May Date of Exam: 08/07/2020 Medical Rec #:  [id number]      Height:       72.0 in Accession #:    [id number]     Weight:       235.0 lb Date of Birth:  [birth year]      BSA:          2.282 m Patient Age:    54 years       BP:           132/103 mmHg Patient Gender:  M              HR:           97 bpm. Exam Location:  Inpatient Procedure: 2D Echo, Cardiac Doppler and Color Doppler Indications:    Acute Respiratory Insufficiency 5018.82 / R06.89  History:        Patient has no prior history of Echocardiogram examinations.                 COVID-19 Positive.  Sonographer:    Tiffany Dance Referring Phys: [id number] Charlotte Sanes  IMPRESSIONS  1. Left ventricular ejection fraction, by estimation, is 40 to 45%. The left ventricle has mildly decreased function. The left ventricle demonstrates regional wall motion abnormalities (see scoring diagram/findings for description). There is mild left ventricular hypertrophy. Left ventricular diastolic parameters are consistent with Grade I diastolic dysfunction (impaired relaxation). There is severe hypokinesis of the left ventricular, basal-mid inferior wall.  2. Right ventricular systolic function is mildly reduced. The right ventricular size is mildly enlarged.  3. Right atrial size was moderately dilated.  4. The mitral valve is grossly normal. No evidence of mitral valve regurgitation.  5. The aortic valve is tricuspid. Aortic valve regurgitation is not visualized.  6. Aortic dilatation noted. There is borderline dilatation at the level of the sinuses of Valsalva measuring 38 mm.  7. The inferior vena cava is normal in size with <50% respiratory variability, suggesting right atrial pressure of 8 mmHg. FINDINGS  Left Ventricle: Left ventricular ejection fraction, by estimation, is 40 to 45%. The left ventricle has mildly decreased function. The left ventricle demonstrates regional wall motion abnormalities. Severe hypokinesis of the left ventricular, basal-mid inferior wall. The left ventricular internal cavity size was normal in size. There is mild left ventricular hypertrophy. Left ventricular diastolic parameters are consistent with Grade I diastolic dysfunction (impaired relaxation). Indeterminate filling pressures. Right Ventricle: The right ventricular size is mildly enlarged. No increase in right ventricular wall thickness. Right ventricular systolic function is mildly reduced. Left Atrium: Left atrial size was normal in size. Right Atrium: Right atrial size was moderately dilated. Pericardium: There is no evidence of pericardial effusion. Mitral Valve: The mitral valve is grossly normal. No  evidence of mitral valve regurgitation. Tricuspid Valve: The tricuspid valve is grossly normal. Tricuspid valve regurgitation is not demonstrated. Aortic Valve: The aortic valve is tricuspid. Aortic valve regurgitation is not visualized. Pulmonic Valve: The pulmonic valve was not well visualized. Pulmonic valve regurgitation is not visualized. Aorta: Aortic dilatation noted. There is borderline dilatation at the level of the sinuses of Valsalva measuring 38 mm. Venous: The inferior vena cava is normal in size with less than 50% respiratory variability, suggesting right atrial pressure of 8 mmHg. IAS/Shunts: No atrial level shunt detected by color flow Doppler.  LEFT VENTRICLE PLAX 2D LVIDd:         4.97 cm  Diastology LVIDs:         3.91 cm  LV e' lateral: 5.33 cm/s LV PW:         1.16 cm  LV e' medial:  4.24 cm/s LV IVS:        0.97 cm LVOT diam:     2.10 cm LV SV:         48 LV SV Index:   21 LVOT Area:     3.46 cm  RIGHT VENTRICLE            IVC RV Basal diam:  3.34 cm  IVC diam: 1.85 cm RV Mid diam:    2.57 cm RV S prime:     8.81 cm/s TAPSE (M-mode): 2.2 cm LEFT ATRIUM             Index       RIGHT ATRIUM           Index LA diam:        3.00 cm 1.31 cm/m  RA Area:     25.10 cm LA Vol (A2C):   33.6 ml 14.73 ml/m RA Volume:   90.40 ml  39.62 ml/m LA Vol (A4C):   39.3 ml 17.22 ml/m LA Biplane Vol: 37.1 ml 16.26 ml/m  AORTIC VALVE LVOT Vmax:   77.60 cm/s LVOT Vmean:  49.300 cm/s LVOT VTI:    0.138 m  AORTA Ao Root diam: 3.80 cm Ao Asc diam:  3.30 cm MV A velocity: 68.65 cm/s                            SHUNTS                            Systemic VTI:  0.14 m                            Systemic Diam: 2.10 cm Zoila Shutter MD Electronically signed by Zoila Shutter MD Signature Date/Time: 08/07/2020/3:22:46 PM    Final    VAS [place] LOWER EXTREMITY VENOUS (DVT)  Result Date: 08/07/2020  Lower Venous DVTStudy Indications: Edema, and Covid positive.  Comparison Study: No prior. Performing Technologist: Marilynne Halsted RDMS, RVT  Examination Guidelines: A complete evaluation includes B-mode imaging, spectral Doppler, color Doppler, and power Doppler as needed of all accessible portions of each vessel. Bilateral testing is considered an integral part of a complete examination. Limited examinations for reoccurring indications may be performed as noted. The reflux portion of the exam is performed with the patient in reverse Trendelenburg.  +---------+---------------+---------+-----------+----------+--------------+ RIGHT    CompressibilityPhasicitySpontaneityPropertiesThrombus Aging +---------+---------------+---------+-----------+----------+--------------+ CFV      Full           Yes      Yes                                 +---------+---------------+---------+-----------+----------+--------------+ SFJ      Full                                                        +---------+---------------+---------+-----------+----------+--------------+ FV Prox  Full                                                        +---------+---------------+---------+-----------+----------+--------------+ FV Mid   Full                                                        +---------+---------------+---------+-----------+----------+--------------+  FV DistalFull                                                        +---------+---------------+---------+-----------+----------+--------------+ PFV      Full                                                        +---------+---------------+---------+-----------+----------+--------------+ POP      Full           Yes      Yes                                 +---------+---------------+---------+-----------+----------+--------------+ PTV      Full                                                        +---------+---------------+---------+-----------+----------+--------------+ PERO     Partial                                      Acute           +---------+---------------+---------+-----------+----------+--------------+   +---------+---------------+---------+-----------+----------+--------------+ LEFT     CompressibilityPhasicitySpontaneityPropertiesThrombus Aging +---------+---------------+---------+-----------+----------+--------------+ CFV      Full           Yes      Yes                                 +---------+---------------+---------+-----------+----------+--------------+ SFJ      Full                                                        +---------+---------------+---------+-----------+----------+--------------+ FV Prox  Full                                                        +---------+---------------+---------+-----------+----------+--------------+ FV Mid   Full                                                        +---------+---------------+---------+-----------+----------+--------------+ FV DistalNone           No       No                   Acute          +---------+---------------+---------+-----------+----------+--------------+ PFV  Full                                                        +---------+---------------+---------+-----------+----------+--------------+ POP      None           No       No                   Acute          +---------+---------------+---------+-----------+----------+--------------+ PTV      None                                         Acute          +---------+---------------+---------+-----------+----------+--------------+ PERO     None                                         Acute          +---------+---------------+---------+-----------+----------+--------------+     Summary: RIGHT: - Findings consistent with acute deep vein thrombosis involving the right peroneal veins. - No cystic structure found in the popliteal fossa.  LEFT: - Findings consistent with acute deep vein thrombosis involving the left femoral vein, left  popliteal vein, left posterior tibial veins, and left peroneal veins.  *See table(s) above for measurements and observations. Electronically signed by Fabienne Bruns MD on 08/07/2020 at 5:10:42 PM.    Final    ECHOCARDIOGRAM LIMITED  Result Date: 08/26/2020    ECHOCARDIOGRAM LIMITED REPORT   Patient Name:   Henry May Date of Exam: 08/26/2020 Medical Rec #:  [id number]      Height:       72.0 in Accession #:    [id number]     Weight:       201.5 lb Date of Birth:  [birth year]      BSA:          2.137 m Patient Age:    54 years       BP:           124/82 mmHg Patient Gender: M              HR:           87 bpm. Exam Location:  Inpatient Procedure: Limited Echo, Limited Color Doppler, Cardiac Doppler and 3D Echo Indications:    Cardiac arrest I46.9  History:        Patient has prior history of Echocardiogram examinations, most                 recent 08/07/2020. Admited with COVID 19 on 8/16. Essential                 hypertension, QT prolongation, Sinus tachycardia, Acute DVT,                 Acute kidney injury.  Sonographer:    Leta Jungling RDCS Referring Phys: [id number] NICOLE GONZALES IMPRESSIONS  1. Left ventricular ejection fraction, by estimation, is 60 to 65%. The left ventricle has normal function. The left ventricle has no regional wall motion abnormalities. The left ventricular internal cavity size was  mildly dilated. Left ventricular diastolic parameters are consistent with Grade II diastolic dysfunction (pseudonormalization). LV longitudinal strain was performed by not accurated due to inappropriate tracking.  2. Right ventricular systolic function is normal. The right ventricular size is normal.  3. The mitral valve is normal in structure. Mild mitral valve regurgitation. No evidence of mitral stenosis.  4. The aortic valve is normal in structure. Aortic valve regurgitation is not visualized. No aortic stenosis is present.  5. Aortic dilatation noted. There is borderline dilatation of the aortic root,  measuring 37 mm.  6. The inferior vena cava is normal in size with greater than 50% respiratory variability, suggesting right atrial pressure of 3 mmHg. FINDINGS  Left Ventricle: Left ventricular ejection fraction, by estimation, is 60 to 65%. The left ventricle has normal function. The left ventricle has no regional wall motion abnormalities. Global longitudinal strain performed but not reported based on interpreter judgement due to suboptimal tracking. The left ventricular internal cavity size was mildly dilated. There is no left ventricular hypertrophy. Left ventricular diastolic parameters are consistent with Grade II diastolic dysfunction (pseudonormalization). Normal left ventricular filling pressure. Right Ventricle: The right ventricular size is normal. No increase in right ventricular wall thickness. Right ventricular systolic function is normal. Left Atrium: Left atrial size was normal in size. Right Atrium: Right atrial size was normal in size. Pericardium: There is no evidence of pericardial effusion. Mitral Valve: The mitral valve is normal in structure. Mild mitral valve regurgitation. No evidence of mitral valve stenosis. Tricuspid Valve: The tricuspid valve is normal in structure. Tricuspid valve regurgitation is not demonstrated. No evidence of tricuspid stenosis. Aortic Valve: The aortic valve is normal in structure. Aortic valve regurgitation is not visualized. No aortic stenosis is present. Pulmonic Valve: The pulmonic valve was normal in structure. Pulmonic valve regurgitation is not visualized. No evidence of pulmonic stenosis. Aorta: Aortic dilatation noted. There is borderline dilatation of the aortic root, measuring 37 mm. Venous: The inferior vena cava is normal in size with greater than 50% respiratory variability, suggesting right atrial pressure of 3 mmHg. IAS/Shunts: No atrial level shunt detected by color flow Doppler. LEFT VENTRICLE PLAX 2D LVIDd:         5.90 cm      Diastology  LVIDs:         4.50 cm      LV e' medial:    4.46 cm/s LV PW:         0.80 cm      LV E/e' medial:  13.5 LV IVS:        0.90 cm      LV e' lateral:   8.05 cm/s LVOT diam:     2.10 cm      LV E/e' lateral: 7.5 LV SV:         53 LV SV Index:   25           2D Longitudinal Strain LVOT Area:     3.46 cm     2D Strain GLS (A2C):   -12.8 %                             2D Strain GLS (A3C):   -13.4 %                             2D Strain GLS (A4C):   -16.3 % LV Volumes (MOD)  2D Strain GLS Avg:     -14.2 % LV vol d, MOD A2C: 130.0 ml LV vol d, MOD A4C: 139.0 ml LV vol s, MOD A2C: 57.6 ml LV vol s, MOD A4C: 53.3 ml  3D Volume EF: LV SV MOD A2C:     72.4 ml  3D EF:        55 % LV SV MOD A4C:     139.0 ml LV EDV:       148 ml LV SV MOD BP:      81.4 ml  LV ESV:       66 ml                             LV SV:        82 ml LEFT ATRIUM         Index LA diam:    3.60 cm 1.68 cm/m  AORTIC VALVE LVOT Vmax:   94.60 cm/s LVOT Vmean:  62.500 cm/s LVOT VTI:    0.153 m  AORTA Ao Root diam: 3.70 cm MITRAL VALVE MV Area (PHT): 3.10 cm    SHUNTS MV Decel Time: 245 msec    Systemic VTI:  0.15 m MV E velocity: 60.20 cm/s  Systemic Diam: 2.10 cm MV A velocity: 58.50 cm/s MV E/A ratio:  1.03 Armanda Magic MD Electronically signed by Armanda Magic MD Signature Date/Time: 08/26/2020/2:22:12 PM    Final     Microbiology: No results found for this or any previous visit (from the past 240 hour(s)).   Labs: Basic Metabolic Panel: Recent Labs  Lab 08/29/20 0902 08/30/20 0630 08/31/20 0830 09/01/20 0521 09/03/20 0836  NA 132* 135 136 134* 134*  K 4.4 4.8 4.3 4.4 4.2  CL 97* 98 97* 102 99  CO2 27 30 28 29 27   GLUCOSE 181* 78 126* 110* 100*  BUN 29* 26* 27* 24* 21*  CREATININE 1.02 0.98 1.00 0.88 0.88  CALCIUM 8.5* 8.7* 9.0 8.3* 8.7*  MG 1.8  --   --   --   --   PHOS 3.1  --   --   --   --    Liver Function Tests: Recent Labs  Lab 08/29/20 0902  AST 70*  ALT 692*  ALKPHOS 119  BILITOT 1.2  PROT 5.9*  ALBUMIN  2.5*   No results for input(s): LIPASE, AMYLASE in the last 168 hours. No results for input(s): AMMONIA in the last 168 hours. CBC: Recent Labs  Lab 08/29/20 0902 08/30/20 0630 08/31/20 0830 09/01/20 0521 09/03/20 0836  WBC 12.0* 10.8* 12.3* 11.1* 10.6*  HGB 10.8* 10.8* 11.8* 10.9* 12.2*  HCT 34.8* 34.3* 37.2* 33.7* 38.0*  MCV 90.9 90.3 90.5 90.3 90.9  PLT 247 262 306 288 353   Cardiac Enzymes: No results for input(s): CKTOTAL, CKMB, CKMBINDEX, TROPONINI in the last 168 hours. BNP: BNP (last 3 results) Recent Labs    08/07/20 0325 08/19/20 0134  BNP 642.8* 419.4*    ProBNP (last 3 results) No results for input(s): PROBNP in the last 8760 hours.  CBG: Recent Labs  Lab 08/29/20 2328 08/30/20 0439 08/30/20 0740 08/30/20 1154 08/30/20 1602  GLUCAP 100* 78 71 104* 139*   Signed:  09/01/20 MD.  [facility] 09/04/2020, 11:41 AM

## 2020-09-04 NOTE — Progress Notes (Addendum)
SATURATION QUALIFICATIONS: (This note is used to comply with regulatory documentation for home oxygen)  Patient Saturations on Room Air at Rest = 86%  Patient Saturations on Room Air while Ambulating = unable to test due to hypoxic on RA%  Patient Saturations on 2 Liters of oxygen while Ambulating = 79%  Please briefly explain why patient needs home oxygen: back up to 90 on 6LO2.  Sheran Lawless, PT Acute Rehabilitation Services Pager:[phone] [phone] 09/04/2020

## 2020-09-04 NOTE — Progress Notes (Signed)
Physical Therapy Treatment Patient Details Name: Henry May MRN: [id number] DOB: 02-18-1966 Today's Date: 09/04/2020    History of Present Illness 54 yo unvaccinated male admitted with hypoxia and WOB due to Covid 19 on 8/22 with initial diagnosis 8/12.  PEA arrest in hospital on 9/7 resulting in intubation.  Pt VDRF. IVC filter placed 9/9.    PT Comments    Patient progressing to hallway ambulation this session and wife present to learn how to adjust O2 and to see how he does mobilizing.  He is supervision level for safety and needed seated rest breaks x 2 and titrated O2 up to 6LPM due to desats on 2L to 79% with ambulation and on 6L to 86% but recovered back to 90% prior to ambulating again.  Feel he will need HHPT at d/c.  PT to follow till d/c.   Follow Up Recommendations  Home health PT;Supervision/Assistance - 24 hour     Equipment Recommendations  Rolling walker with 5" wheels;Wheelchair cushion (measurements PT);3in1 (PT);Wheelchair (measurements PT)    Recommendations for Other Services       Precautions / Restrictions Precautions Precautions: Fall Precaution Comments: watch sats, RR    Mobility  Bed Mobility   Bed Mobility: Supine to Sit;Sit to Supine     Supine to sit: Modified independent (Device/Increase time);HOB elevated Sit to supine: Supervision;HOB elevated   General bed mobility comments: Increased time to supine and cues for positioning  Transfers Overall transfer level: Needs assistance Equipment used: Rolling walker (2 wheeled) Transfers: Sit to/from Stand Sit to Stand: Supervision         General transfer comment: able to stand with S for lines  Ambulation/Gait Ambulation/Gait assistance: Supervision;+2 safety/equipment (chair following (wife pushing)) Gait Distance (Feet): 55 Feet (80'&50') Assistive device: Rolling walker (2 wheeled) Gait Pattern/deviations: Step-through pattern;Decreased stride length     General Gait Details: sat  to rest x 2 in hallway with wife pushing recliner.  Patient initially on 2L, then up to 6L during ambulation due to desat to 79%, after O2 increased desat to only about 87%   Optometrist    Modified Rankin (Stroke Patients Only)       Balance Overall balance assessment: Needs assistance   Sitting balance-Leahy Scale: Good       Standing balance-Leahy Scale: Fair                              Cognition Arousal/Alertness: Awake/alert Behavior During Therapy: WFL for tasks assessed/performed Overall Cognitive Status: Within Functional Limits for tasks assessed                                        Exercises      General Comments General comments (skin integrity, edema, etc.): Educated wife in how to adjust O2 on tank and how to see if tank needs to be replaced, also discussed checking sats and how to know if need to titrate up but not more than 6L      Pertinent Vitals/Pain Faces Pain Scale: No hurt    Home Living                      Prior Function            PT  Goals (current goals can now be found in the care plan section) Progress towards PT goals: Progressing toward goals    Frequency    Min 3X/week      PT Plan Discharge plan needs to be updated    Co-evaluation              AM-PAC PT "6 Clicks" Mobility   Outcome Measure  Help needed turning from your back to your side while in a flat bed without using bedrails?: None Help needed moving from lying on your back to sitting on the side of a flat bed without using bedrails?: None Help needed moving to and from a bed to a chair (including a wheelchair)?: A Little Help needed standing up from a chair using your arms (e.g., wheelchair or bedside chair)?: A Little Help needed to walk in hospital room?: A Little Help needed climbing 3-5 steps with a railing? : A Little 6 Click Score: 20    End of Session Equipment Utilized  During Treatment: Oxygen Activity Tolerance: Patient limited by fatigue Patient left: with call bell/phone within reach;in bed;with family/visitor present   PT Visit Diagnosis: Other abnormalities of gait and mobility (R26.89);Muscle weakness (generalized) (M62.81);Difficulty in walking, not elsewhere classified (R26.2)     Time: 8250-5397 PT Time Calculation (min) (ACUTE ONLY): 25 min  Charges:  $Gait Training: 8-22 mins $Self Care/Home Management: 8-22                     Sheran Lawless, PT Acute Rehabilitation Services Pager:[phone] [phone] 09/04/2020    Henry May 09/04/2020, 12:51 PM

## 2020-09-04 NOTE — Progress Notes (Signed)
IV removed from r fa. Pt tolerated well. DC instructions given to pt with wife at bedside. Oxygen tank use was gone over with wife. Wheeled to vehicle via wc. Mayford Knife RN

## 2020-09-04 NOTE — Progress Notes (Signed)
   Durable Medical Equipment (From admission, onward)       Start     Ordered  09/04/20 1251   For home use only DME lightweight manual wheelchair with seat cushion  Once      Comments: Patient suffers from limited activity tolerance which impairs their ability to perform daily activities like ADLs and ambulation greater than 80 feet in the home.  A walking aid such as a cane or rolling walker will not resolve  issue with performing activities of daily living. A wheelchair will allow patient to safely perform daily activities. Patient is not able to propel themselves in the home using a standard weight wheelchair due to weakness and poor activity tolerance. Patient can self propel in the lightweight wheelchair. Length of need Timeframe: lifetime. Accessories: elevating leg rests (ELRs), wheel locks, extensions and anti-tippers. Back cushion  09/04/20 1252  09/04/20 1249   For home use only DME 3 n 1  Once       09/04/20 1248  09/04/20 1249   For home use only DME Walker rolling  Once      Question Answer Comment Walker: With 5 Inch Wheels  Patient needs a walker to treat with the following condition Weakness    09/04/20 1248  09/04/20 1245   For home use only DME oxygen  Once      Question Answer Comment Length of Need 6 Months  Mode or (Route) Nasal cannula  Liters per Minute 2  Frequency Continuous (stationary and portable oxygen unit needed)  Oxygen conserving device Yes  Oxygen delivery system Gas    09/04/20 1245

## 2020-09-04 NOTE — TOC Transition Note (Signed)
Transition of Care (TOC) - CM/SW Discharge Note Marvetta Gibbons RN,BSN Transitions of Care Unit 4NP (non trauma) - RN Case Manager See Treatment Team for direct Phone #   Patient Details  Name: Henry May MRN: [id number] Date of Birth: 07-Sep-1966  Transition of Care [facility]) CM/SW Contact:  Dawayne Patricia, RN Phone Number: 09/04/2020, 3:05 PM   Clinical Narrative:    Pt stable for transition home today, [place] and DME orders placed - pt will also need PCP.  CM spoke with pt and wife at the bedside- confirmed address, phone # in epic. Confirmed pt does not have PCP- choice offered- and pt would like to stay within the [facility] network for PCP provider.  Also discussed DME and HH- list provided for [facility] choice Per CMS guidelines from medicare.gov website with star ratings (copy placed in shadow chart) per pt and wife they do not have preference for [facility] or DME needs and defer to CM to arrange. They are ok with using in house provider for DME needs- RW, 3n1, w/c and home 02. Will have these delivered to room prior to discharge.   Call made to [facility] with [employer] for DME needs- once processed with insurance- RW, 3n1, w/c and portable 02 will be delivered to room.   Call made to [facility] with [facility] for HHPT needs- referral has been accepted. PCP will be set up with [facility].  Notified [facility] director on call Dr. Max Sane regarding need for someone to sign off on [facility] orders until pt establishes with primary care- verbal ok given by Dr. Manuella Ghazi for [facility] to contact director on call for [facility] order needs-   1630- spoke again with pt and wife at bedside- info provided on primary care and [facility] agency- pt has not met deductible for DME and has a copay cost that they can not afford- have spoken with Thedore Mins at [place]- monthly rental cost is at $39.58 which pt and wife are ok with however the out of pocket cost for today is what they can not afford for all the ordered DME- suggest that they wait on  w/c until deductible is met and go with the most needed equipment for now- RW and home 02-  Will ask cost for 3n1 but may wait on that too- which pt is ok with. Awaiting return call from [place].   Final next level of care: [place] Barriers to Discharge: Barriers Resolved   Patient Goals and CMS Choice Patient states their goals for this hospitalization and ongoing recovery are:: return home and get stronger/better CMS Medicare.gov Compare Post Acute Care list provided to:: Patient Choice offered to / list presented to : Patient, Spouse  Discharge Placement                       Discharge Plan and Services   Discharge Planning Services: CM Consult, Follow-up appt scheduled Post Acute Care Choice: Durable Medical Equipment, Home Health          DME Arranged: 3-N-1, Oxygen, Walker rolling DME Agency: [employer] Date DME Agency Contacted: 09/04/20 Time DME Agency Contacted: 1330 Representative spoke with at DME Agency: [place]: PT [place]: [place] Date [place]: 09/04/20 Time [place]: 1415 Representative spoke with at [place]: [place] Interventions     Readmission Risk Interventions Readmission Risk Prevention Plan 09/04/2020  Post Dischage Appt Complete  Medication Screening Complete  Transportation Screening Complete

## 2020-09-20 DIAGNOSIS — U071 COVID-19: Secondary | ICD-10-CM | POA: Diagnosis not present

## 2020-09-20 DIAGNOSIS — R2689 Other abnormalities of gait and mobility: Secondary | ICD-10-CM | POA: Diagnosis not present

## 2020-09-20 DIAGNOSIS — M6281 Muscle weakness (generalized): Secondary | ICD-10-CM | POA: Diagnosis not present

## 2020-10-04 DIAGNOSIS — U071 COVID-19: Secondary | ICD-10-CM | POA: Diagnosis not present

## 2020-10-05 ENCOUNTER — Encounter (HOSPITAL_COMMUNITY): Payer: Self-pay | Admitting: *Deleted

## 2020-10-05 ENCOUNTER — Emergency Department (HOSPITAL_COMMUNITY)
Admission: EM | Admit: 2020-10-05 | Discharge: 2020-10-06 | Disposition: A | Discharge status: Home / Self Care | Payer: BC Managed Care – PPO | Source: Home / Self Care | Attending: Emergency Medicine | Admitting: Emergency Medicine

## 2020-10-05 ENCOUNTER — Ambulatory Visit (HOSPITAL_COMMUNITY)
Admission: EM | Admit: 2020-10-05 | Discharge: 2020-10-05 | Payer: BC Managed Care – PPO | Attending: Family Medicine | Admitting: Family Medicine

## 2020-10-05 ENCOUNTER — Ambulatory Visit (HOSPITAL_COMMUNITY): Payer: BC Managed Care – PPO

## 2020-10-05 ENCOUNTER — Other Ambulatory Visit: Payer: Self-pay

## 2020-10-05 ENCOUNTER — Encounter (HOSPITAL_COMMUNITY): Payer: Self-pay

## 2020-10-05 DIAGNOSIS — N3000 Acute cystitis without hematuria: Secondary | ICD-10-CM | POA: Diagnosis not present

## 2020-10-05 DIAGNOSIS — Z95828 Presence of other vascular implants and grafts: Secondary | ICD-10-CM | POA: Diagnosis not present

## 2020-10-05 DIAGNOSIS — I10 Essential (primary) hypertension: Secondary | ICD-10-CM | POA: Diagnosis not present

## 2020-10-05 DIAGNOSIS — R109 Unspecified abdominal pain: Secondary | ICD-10-CM

## 2020-10-05 DIAGNOSIS — Z87442 Personal history of urinary calculi: Secondary | ICD-10-CM | POA: Diagnosis not present

## 2020-10-05 DIAGNOSIS — Z8616 Personal history of COVID-19: Secondary | ICD-10-CM | POA: Insufficient documentation

## 2020-10-05 DIAGNOSIS — K661 Hemoperitoneum: Secondary | ICD-10-CM | POA: Diagnosis not present

## 2020-10-05 DIAGNOSIS — N132 Hydronephrosis with renal and ureteral calculous obstruction: Secondary | ICD-10-CM | POA: Diagnosis not present

## 2020-10-05 LAB — BASIC METABOLIC PANEL
Anion gap: 10 (ref 5–15)
BUN: 13 mg/dL (ref 6–20)
CO2: 25 mmol/L (ref 22–32)
Calcium: 9.7 mg/dL (ref 8.9–10.3)
Chloride: 101 mmol/L (ref 98–111)
Creatinine, Ser: 1.21 mg/dL (ref 0.61–1.24)
GFR, Estimated: >60 mL/min (ref >60)
Glucose, Bld: 115 mg/dL — ABNORMAL HIGH (ref 70–99)
Potassium: 4.4 mmol/L (ref 3.5–5.1)
Sodium: 136 mmol/L (ref 135–145)

## 2020-10-05 LAB — URINALYSIS, ROUTINE W REFLEX MICROSCOPIC
Bilirubin Urine: NEGATIVE
Glucose, UA: NEGATIVE mg/dL
Ketones, ur: NEGATIVE mg/dL
Nitrite: NEGATIVE
Protein, ur: 100 mg/dL — AB
RBC / HPF: >50 RBC/hpf — ABNORMAL HIGH (ref 0–5)
Specific Gravity, Urine: 1.02 (ref 1.005–1.030)
WBC, UA: >50 WBC/hpf — ABNORMAL HIGH (ref 0–5)
pH: 5 (ref 5.0–8.0)

## 2020-10-05 LAB — CBC
HCT: 44 % (ref 39.0–52.0)
Hemoglobin: 13.7 g/dL (ref 13.0–17.0)
MCH: 29 pg (ref 26.0–34.0)
MCHC: 31.1 g/dL (ref 30.0–36.0)
MCV: 93 fL (ref 80.0–100.0)
Platelets: 355 10*3/uL (ref 150–400)
RBC: 4.73 MIL/uL (ref 4.22–5.81)
RDW: 15.8 % — ABNORMAL HIGH (ref 11.5–15.5)
WBC: 14.8 10*3/uL — ABNORMAL HIGH (ref 4.0–10.5)
nRBC: 0 % (ref 0.0–0.2)

## 2020-10-05 LAB — LACTIC ACID, PLASMA: Lactic Acid, Venous: 1.1 mmol/L (ref 0.5–1.9)

## 2020-10-05 MED ORDER — KETOROLAC TROMETHAMINE 30 MG/ML IJ SOLN: 30.0000 mg | Freq: Once | INTRAMUSCULAR | Status: DC

## 2020-10-05 MED ORDER — ONDANSETRON 4 MG PO TBDP: 8.0000 mg | ORAL_TABLET | Freq: Once | ORAL | Status: DC

## 2020-10-05 MED ORDER — ONDANSETRON 4 MG PO TBDP
ORAL_TABLET | ORAL | Status: AC
  Filled 2020-10-05: qty 2

## 2020-10-05 MED ORDER — KETOROLAC TROMETHAMINE 30 MG/ML IJ SOLN
INTRAMUSCULAR | Status: AC
  Filled 2020-10-05: qty 1

## 2020-10-05 NOTE — ED Provider Notes (Signed)
[facility] EMERGENCY DEPARTMENT Provider Note   CSN: [id number] Arrival date & time: 10/05/20  1912     History Chief Complaint  Patient presents with  . Flank Pain    Henry May is a 54 y.o. male.  The history is provided by the patient, the spouse and medical records. No language interpreter was used.  Flank Pain This is a new problem. The current episode started yesterday. The problem occurs constantly. The problem has been resolved. Associated symptoms include abdominal pain. Pertinent negatives include no chest pain, no headaches and no shortness of breath. Nothing aggravates the symptoms. Nothing relieves the symptoms. He has tried nothing for the symptoms. The treatment provided no relief.       History reviewed. No pertinent past medical history.  Patient Active Problem List   Diagnosis Date Noted  . Leg DVT (deep venous thromboembolism), acute, bilateral (HCC) 08/19/2020  . Chest pain 08/19/2020  . Acute hypoxemic respiratory failure due to COVID-19 (HCC) 08/08/2020  . ARF (acute renal failure) (HCC) 08/07/2020  . Elevated LFTs 08/07/2020  . Acute respiratory disease due to COVID-19 virus 08/07/2020  . Pneumonia due to COVID-19 virus 08/07/2020  . Acute respiratory failure due to COVID-19 [facility]) 08/06/2020    Past Surgical History:  Procedure Laterality Date  . IR IVC FILTER PLMT / S&I /IMG GUID/MOD SED  08/24/2020       Family History  Problem Relation Age of Onset  . Diabetes Mellitus II Neg Hx     Social History   Tobacco Use  . Smoking status: Never Smoker  . Smokeless tobacco: Never Used  Substance Use Topics  . Alcohol use: Never  . Drug use: Never    Home Medications Prior to Admission medications   Medication Sig Start Date End Date Taking? Authorizing Provider  acetaminophen (TYLENOL) 325 MG tablet Take 325-650 mg by mouth every 6 (six) hours as needed for mild pain, fever or headache.    [provider]   metoprolol tartrate (LOPRESSOR) 25 MG tablet Take 1 tablet (25 mg total) by mouth 2 (two) times daily. 09/04/20   Zannie Cove, MD  pantoprazole (PROTONIX) 40 MG tablet Take 1 tablet (40 mg total) by mouth at bedtime. 09/04/20   Zannie Cove, MD  senna (SENOKOT) 8.6 MG TABS tablet Take 2 tablets (17.2 mg total) by mouth daily as needed for mild constipation. 09/04/20   Zannie Cove, MD    Allergies    Patient has no known allergies.  Review of Systems   Review of Systems  Constitutional: Negative for chills, diaphoresis, fatigue and fever.  HENT: Negative for congestion.   Respiratory: Negative for cough, chest tightness, shortness of breath and wheezing.   Cardiovascular: Negative for chest pain, palpitations and leg swelling.  Gastrointestinal: Positive for abdominal pain, diarrhea, nausea and vomiting. Negative for constipation.  Genitourinary: Positive for flank pain. Negative for difficulty urinating, dysuria and frequency.       Darkened urine  Musculoskeletal: Negative for back pain, neck pain and neck stiffness.  Neurological: Negative for light-headedness and headaches.  Psychiatric/Behavioral: Negative for agitation and confusion.  All other systems reviewed and are negative.   Physical Exam Updated Vital Signs BP (!) 156/93 (BP Location: Left Arm)   Pulse 93   Temp 98 F (36.7 C) (Oral)   Resp 16   SpO2 97%   Physical Exam Vitals and nursing note reviewed.  Constitutional:      General: He is not in acute distress.  Appearance: Normal appearance. He is well-developed. He is not ill-appearing, toxic-appearing or diaphoretic.  HENT:     Head: Normocephalic and atraumatic.  Eyes:     Conjunctiva/sclera: Conjunctivae normal.  Cardiovascular:     Rate and Rhythm: Normal rate and regular rhythm.     Heart sounds: No murmur heard.   Pulmonary:     Effort: Pulmonary effort is normal. No respiratory distress.     Breath sounds: Normal breath sounds. No  wheezing, rhonchi or rales.  Chest:     Chest wall: No tenderness.  Abdominal:     General: Abdomen is flat.     Palpations: Abdomen is soft.     Tenderness: There is no abdominal tenderness. There is no right CVA tenderness, left CVA tenderness, guarding or rebound.  Musculoskeletal:        General: No tenderness.     Cervical back: Neck supple. No tenderness.     Right lower leg: No edema.     Left lower leg: No edema.  Skin:    General: Skin is warm and dry.     Capillary Refill: Capillary refill takes less than 2 seconds.     Findings: No erythema.  Neurological:     Mental Status: He is alert.     ED Results / Procedures / Treatments   Labs (all labs ordered are listed, but only abnormal results are displayed) Labs Reviewed  URINALYSIS, ROUTINE W REFLEX MICROSCOPIC - Abnormal; Notable for the following components:      Result Value   APPearance CLOUDY (*)    Hgb urine dipstick LARGE (*)    Protein, ur 100 (*)    Leukocytes,Ua MODERATE (*)    RBC / HPF >50 (*)    WBC, UA >50 (*)    Bacteria, UA RARE (*)    All other components within normal limits  BASIC METABOLIC PANEL - Abnormal; Notable for the following components:   Glucose, Bld 115 (*)    All other components within normal limits  CBC - Abnormal; Notable for the following components:   WBC 14.8 (*)    RDW 15.8 (*)    All other components within normal limits  URINE CULTURE  CULTURE, BLOOD (ROUTINE X 2)  CULTURE, BLOOD (ROUTINE X 2)  LACTIC ACID, PLASMA  LACTIC ACID, PLASMA    EKG None  Radiology No results found.  Procedures Procedures (including critical care time)  Medications Ordered in ED Medications  iohexol (OMNIPAQUE) 300 MG/ML solution 100 mL (100 mLs Intravenous Contrast Given 10/06/20 0013)  morphine 4 MG/ML injection 4 mg (4 mg Intravenous Given 10/06/20 0019)  ondansetron (ZOFRAN) injection 4 mg (4 mg Intravenous Given 10/06/20 0019)    ED Course  I have reviewed the triage vital  signs and the nursing notes.  Pertinent labs & imaging results that were available during my care of the patient were reviewed by me and considered in my medical decision making (see chart for details).    MDM Rules/Calculators/A&P                          Henry May is a 54 y.o. male with a past medical history significant for prior DVT status post IVC filter, recent admission for COVID-19 infection last month, and prior left flank retroperitoneal hematoma who presents with left flank pain.  Patient reports that he has done well since his admission last month for COVID-19 where he was found to have a  retroperitoneal hematoma.  He reports that last night around 5 PM, he started having pain in his left flank and back that was moderate to severe.  He reports it radiated around towards his left lower quadrant.  He reports his urine was darker and slightly more red today but that has been clearing and improving.  He reports one episode of nausea and vomiting but denies fevers, chills, congestion, cough, chest pain, shortness of breath.  He denies new trauma.  He went to urgent care where he was seen and they told him to come to the emergency department for evaluation and possible imaging to rule out complications of his recent hematoma versus something like a kidney stone.  He did report he had some diarrhea earlier that has resolved as well.  On exam, lungs are clear and chest is nontender.  Abdomen is nontender.  Patient reported his pain which was previously a 10 out of 10 is now a 0 out of 10.  He did not have tenderness in his left flank on my exam.  No left CVA tenderness.  Normal bowel sounds.  Patient resting comfortably in no distress.  Due to his recent hematoma, I spoke with radiology and they did recommend a CT scan with and without contrast to rule out development of abscess or infection with his known hematoma site, further bleeding into the site, as well as a noncontrasted component of the  imaging to look for kidney stone given the darkened urine and left flank pain and CT previously showing evidence of prior stones.  Patient will have a lactic acid added as he does have a leukocytosis earlier today.  We will get culture added onto the urinalysis.  Anticipate reassessment after imaging and labs.  Care transferred to Dr. Blinda Leatherwood while awaiting results.    Final Clinical Impression(s) / ED Diagnoses Final diagnoses:  Flank pain   Clinical Impression: 1. Flank pain     Disposition: Care transferred to Dr. Blinda Leatherwood while awaiting results.   This note was prepared with assistance of Conservation officer, historic buildings. Occasional wrong-word or sound-a-like substitutions may have occurred due to the inherent limitations of voice recognition software.     Azarria Balint, Canary Brim, MD 10/06/20 [phone]

## 2020-10-05 NOTE — ED Triage Notes (Addendum)
Pt present pain that is located behindthe rib on the left side and very deep.  Symptoms started last night .  Pt also vomited today as well.

## 2020-10-05 NOTE — ED Triage Notes (Signed)
Pt is here for left flank pain.  Pt was seen at [facility] and sent here for further workup.  Pt has had nausea and vomiting which began this am.  No pain or burning with urination, urine clear and yellow per pt.

## 2020-10-05 NOTE — ED Provider Notes (Signed)
[facility]    CSN: [id number] Arrival date & time: 10/05/20  1747      History   Chief Complaint Chief Complaint  Patient presents with  . Back Pain    HPI Henry May is a 54 y.o. male.   HPI  Patient states that he woke this morning around 3 AM with left-sided deep abdominal pain.  It is deep in his abdomen through to his flank.  It is unremitting.  It is worsening.  Since then he has had 1 spell of diarrhea.  Since he is arrived at the urgent care center he has had 1 spell of emesis.  No fever or chills.  No headache or body aches.  No history of kidney disease or kidney stones.  No urinary symptoms or hematuria.  Patient was hospitalized from August 22 through September 20 for  a severe episode of COVID-19 with respiratory failure, sepsis, DVT, and a large left retroperitoneal hematoma.  He has been home recovering.  He has not had abdominal or flank pain until today.  History reviewed. No pertinent past medical history.  Patient Active Problem List   Diagnosis Date Noted  . Leg DVT (deep venous thromboembolism), acute, bilateral (HCC) 08/19/2020  . Chest pain 08/19/2020  . Acute hypoxemic respiratory failure due to COVID-19 (HCC) 08/08/2020  . ARF (acute renal failure) (HCC) 08/07/2020  . Elevated LFTs 08/07/2020  . Acute respiratory disease due to COVID-19 virus 08/07/2020  . Pneumonia due to COVID-19 virus 08/07/2020  . Acute respiratory failure due to COVID-19 [facility]) 08/06/2020    Past Surgical History:  Procedure Laterality Date  . IR IVC FILTER PLMT / S&I /IMG GUID/MOD SED  08/24/2020       Home Medications    Prior to Admission medications   Medication Sig Start Date End Date Taking? Authorizing Provider  acetaminophen (TYLENOL) 325 MG tablet Take 325-650 mg by mouth every 6 (six) hours as needed for mild pain, fever or headache.    [provider]  metoprolol tartrate (LOPRESSOR) 25 MG tablet Take 1 tablet (25 mg total) by mouth 2  (two) times daily. 09/04/20   Zannie Cove, MD  pantoprazole (PROTONIX) 40 MG tablet Take 1 tablet (40 mg total) by mouth at bedtime. 09/04/20   Zannie Cove, MD  senna (SENOKOT) 8.6 MG TABS tablet Take 2 tablets (17.2 mg total) by mouth daily as needed for mild constipation. 09/04/20   Zannie Cove, MD    Family History Family History  Problem Relation Age of Onset  . Diabetes Mellitus II Neg Hx     Social History Social History   Tobacco Use  . Smoking status: Never Smoker  . Smokeless tobacco: Never Used  Substance Use Topics  . Alcohol use: Never  . Drug use: Never     Allergies   Patient has no known allergies.   Review of Systems Review of Systems See HPI  Physical Exam Triage Vital Signs ED Triage Vitals  Enc Vitals Group     BP 10/05/20 1827 (!) 152/97     Pulse Rate 10/05/20 1827 82     Resp 10/05/20 1827 16     Temp 10/05/20 1827 98.9 F (37.2 C)     Temp Source 10/05/20 1827 Oral     SpO2 10/05/20 1827 97 %     Weight --      Height --      Head Circumference --      Peak Flow --  Pain Score 10/05/20 1826 7     Pain Loc --      Pain Edu? --      Excl. in GC? --    No data found.  Updated Vital Signs BP (!) 152/97 (BP Location: Right Arm)   Pulse 82   Temp 98.9 F (37.2 C) (Oral)   Resp 16   SpO2 97%      Physical Exam Constitutional:      General: He is in acute distress.     Appearance: He is well-developed. He is ill-appearing.     Comments: Skin is clammy.  Patient is very uncomfortable.  Writhing on table.  HENT:     Head: Normocephalic and atraumatic.     Mouth/Throat:     Comments: Mask is in place Eyes:     Conjunctiva/sclera: Conjunctivae normal.     Pupils: Pupils are equal, round, and reactive to light.  Cardiovascular:     Rate and Rhythm: Normal rate and regular rhythm.     Heart sounds: Normal heart sounds.  Pulmonary:     Effort: Pulmonary effort is normal. No respiratory distress.     Breath sounds:  Normal breath sounds.  Chest:     Chest wall: No tenderness.  Abdominal:     General: Bowel sounds are normal. There is no distension.     Palpations: Abdomen is soft. There is no mass.     Tenderness: There is right CVA tenderness. There is no left CVA tenderness.  Musculoskeletal:        General: Normal range of motion.     Cervical back: Normal range of motion.  Skin:    General: Skin is warm and dry.  Neurological:     Mental Status: He is alert.  Psychiatric:        Behavior: Behavior normal.      UC Treatments / Results  Labs (all labs ordered are listed, but only abnormal results are displayed) Labs Reviewed - No data to display  EKG   Radiology No results found.  Procedures Procedures (including critical care time)  Medications Ordered in UC Medications - No data to display  Initial Impression / Assessment and Plan / UC Course  I have reviewed the triage vital signs and the nursing notes.  Pertinent labs & imaging results that were available during my care of the patient were reviewed by me and considered in my medical decision making (see chart for details).     I have concern regarding the patient's severe abdominal pain given his recent history of large retroperitoneal hematoma.  I am unable to evaluate this at the urgent care center, and have sent him to the emergency room for additional imaging. Final Clinical Impressions(s) / UC Diagnoses   Final diagnoses:  Acute left flank pain     Discharge Instructions     Your new left-sided flank pain is in the same area that your internal bleeding was located when you were hospitalized with Covid.  This is called a retroperitoneal hematoma. You need to go directly to the emergency room to be evaluated You likely need additional scanning of your abdomen   ED Prescriptions    None     PDMP not reviewed this encounter.   Eustace Moore, MD 10/05/20 2001

## 2020-10-05 NOTE — Discharge Instructions (Addendum)
Your new left-sided flank pain is in the same area that your internal bleeding was located when you were hospitalized with Covid.  This is called a retroperitoneal hematoma. You need to go directly to the emergency room to be evaluated You likely need additional scanning of your abdomen

## 2020-10-05 NOTE — ED Notes (Signed)
Received a epic secure chat message from Dr. Delton See advising that "pt had retroperitoneal hematoma last month when hospitalized for COVID. CIncern for change.

## 2020-10-05 NOTE — ED Notes (Signed)
Patient is being discharged from the Urgent Care and sent to the Emergency Department via wheelchair by UC staff . Per Dr Delton See, patient is in need of higher level of care due to unstable flank pain after hpospitalization. Patient is aware and verbalizes understanding of plan of care.   Vitals:   10/05/20 1827  BP: (!) 152/97  Pulse: 82  Resp: 16  Temp: 98.9 F (37.2 C)  SpO2: 97%

## 2020-10-05 NOTE — ED Notes (Signed)
Pt transported to CT ?

## 2020-10-06 ENCOUNTER — Encounter (HOSPITAL_COMMUNITY)
Admission: RE | Disposition: A | Discharge status: Home / Self Care | Payer: Self-pay | Source: Ambulatory Visit | Attending: Urology

## 2020-10-06 ENCOUNTER — Ambulatory Visit (HOSPITAL_COMMUNITY)
Admission: RE | Admit: 2020-10-06 | Discharge: 2020-10-06 | Disposition: A | Discharge status: Home / Self Care | Payer: BC Managed Care – PPO | Source: Ambulatory Visit | Attending: Urology | Admitting: Urology

## 2020-10-06 ENCOUNTER — Inpatient Hospital Stay (HOSPITAL_COMMUNITY): Payer: BC Managed Care – PPO | Admitting: Anesthesiology

## 2020-10-06 ENCOUNTER — Emergency Department (HOSPITAL_COMMUNITY): Payer: BC Managed Care – PPO

## 2020-10-06 ENCOUNTER — Encounter (HOSPITAL_COMMUNITY): Payer: Self-pay | Admitting: Urology

## 2020-10-06 ENCOUNTER — Other Ambulatory Visit: Payer: Self-pay | Admitting: Urology

## 2020-10-06 ENCOUNTER — Inpatient Hospital Stay (HOSPITAL_COMMUNITY): Payer: BC Managed Care – PPO

## 2020-10-06 DIAGNOSIS — Z87442 Personal history of urinary calculi: Secondary | ICD-10-CM | POA: Diagnosis not present

## 2020-10-06 DIAGNOSIS — N179 Acute kidney failure, unspecified: Secondary | ICD-10-CM | POA: Diagnosis not present

## 2020-10-06 DIAGNOSIS — N3000 Acute cystitis without hematuria: Secondary | ICD-10-CM | POA: Insufficient documentation

## 2020-10-06 DIAGNOSIS — N132 Hydronephrosis with renal and ureteral calculous obstruction: Secondary | ICD-10-CM | POA: Insufficient documentation

## 2020-10-06 DIAGNOSIS — K661 Hemoperitoneum: Secondary | ICD-10-CM | POA: Diagnosis not present

## 2020-10-06 DIAGNOSIS — I10 Essential (primary) hypertension: Secondary | ICD-10-CM | POA: Insufficient documentation

## 2020-10-06 DIAGNOSIS — R8279 Other abnormal findings on microbiological examination of urine: Secondary | ICD-10-CM | POA: Diagnosis not present

## 2020-10-06 DIAGNOSIS — Z8616 Personal history of COVID-19: Secondary | ICD-10-CM | POA: Insufficient documentation

## 2020-10-06 DIAGNOSIS — Z95828 Presence of other vascular implants and grafts: Secondary | ICD-10-CM | POA: Diagnosis not present

## 2020-10-06 DIAGNOSIS — R1084 Generalized abdominal pain: Secondary | ICD-10-CM | POA: Diagnosis not present

## 2020-10-06 DIAGNOSIS — I82403 Acute embolism and thrombosis of unspecified deep veins of lower extremity, bilateral: Secondary | ICD-10-CM | POA: Diagnosis not present

## 2020-10-06 DIAGNOSIS — N201 Calculus of ureter: Secondary | ICD-10-CM | POA: Diagnosis not present

## 2020-10-06 HISTORY — PX: CYSTOSCOPY WITH STENT PLACEMENT: SHX5790

## 2020-10-06 SURGERY — CYSTOSCOPY, WITH STENT INSERTION
Anesthesia: General | Site: Ureter | Laterality: Left

## 2020-10-06 MED ORDER — OXYCODONE HCL 5 MG PO TABS: 5.0000 mg | ORAL_TABLET | Freq: Once | ORAL | Status: DC | PRN

## 2020-10-06 MED ORDER — METOPROLOL TARTRATE 25 MG PO TABS
25.0000 mg | ORAL_TABLET | Freq: Once | ORAL | Status: AC
  Administered 2020-10-06: 25 mg via ORAL
  Filled 2020-10-06: qty 1

## 2020-10-06 MED ORDER — PROPOFOL 500 MG/50ML IV EMUL
INTRAVENOUS | Status: DC | PRN
  Administered 2020-10-06: 200 mg via INTRAVENOUS

## 2020-10-06 MED ORDER — MIDAZOLAM HCL 2 MG/2ML IJ SOLN
INTRAMUSCULAR | Status: DC | PRN
  Administered 2020-10-06: 2 mg via INTRAVENOUS

## 2020-10-06 MED ORDER — LIDOCAINE 2% (20 MG/ML) 5 ML SYRINGE
INTRAMUSCULAR | Status: AC
  Filled 2020-10-06: qty 5

## 2020-10-06 MED ORDER — WATER FOR IRRIGATION, STERILE IR SOLN
Status: DC | PRN
  Administered 2020-10-06: 3000 mL

## 2020-10-06 MED ORDER — DEXAMETHASONE SODIUM PHOSPHATE 10 MG/ML IJ SOLN
INTRAMUSCULAR | Status: DC | PRN
  Administered 2020-10-06: 10 mg via INTRAVENOUS

## 2020-10-06 MED ORDER — TAMSULOSIN HCL 0.4 MG PO CAPS
0.4000 mg | ORAL_CAPSULE | Freq: Every day | ORAL | 0 refills | Status: DC
Start: 2020-10-06 — End: 2020-10-16

## 2020-10-06 MED ORDER — OXYCODONE-ACETAMINOPHEN 5-325 MG PO TABS
1.0000 | ORAL_TABLET | ORAL | 0 refills | Status: DC | PRN
Start: 2020-10-06 — End: 2021-04-25

## 2020-10-06 MED ORDER — MEPERIDINE HCL 50 MG/ML IJ SOLN: 6.2500 mg | INTRAMUSCULAR | Status: DC | PRN

## 2020-10-06 MED ORDER — OXYCODONE HCL 5 MG/5ML PO SOLN: 5.0000 mg | Freq: Once | ORAL | Status: DC | PRN

## 2020-10-06 MED ORDER — FENTANYL CITRATE (PF) 100 MCG/2ML IJ SOLN: 25.0000 ug | INTRAMUSCULAR | Status: DC | PRN

## 2020-10-06 MED ORDER — PROMETHAZINE HCL 25 MG/ML IJ SOLN: 6.2500 mg | INTRAMUSCULAR | Status: DC | PRN

## 2020-10-06 MED ORDER — MORPHINE SULFATE (PF) 4 MG/ML IV SOLN
4.0000 mg | Freq: Once | INTRAVENOUS | Status: AC
  Administered 2020-10-06: 4 mg via INTRAVENOUS
  Filled 2020-10-06: qty 1

## 2020-10-06 MED ORDER — LIDOCAINE 2% (20 MG/ML) 5 ML SYRINGE
INTRAMUSCULAR | Status: DC | PRN
  Administered 2020-10-06: 60 mg via INTRAVENOUS

## 2020-10-06 MED ORDER — CIPROFLOXACIN IN D5W 400 MG/200ML IV SOLN
400.0000 mg | Freq: Once | INTRAVENOUS | Status: AC
  Administered 2020-10-06: 400 mg via INTRAVENOUS

## 2020-10-06 MED ORDER — LACTATED RINGERS IV SOLN: INTRAVENOUS | Status: DC

## 2020-10-06 MED ORDER — ONDANSETRON HCL 4 MG/2ML IJ SOLN
4.0000 mg | Freq: Once | INTRAMUSCULAR | Status: AC
  Administered 2020-10-06: 4 mg via INTRAVENOUS
  Filled 2020-10-06: qty 2

## 2020-10-06 MED ORDER — FENTANYL CITRATE (PF) 100 MCG/2ML IJ SOLN
INTRAMUSCULAR | Status: DC | PRN
  Administered 2020-10-06 (×2): 50 ug via INTRAVENOUS

## 2020-10-06 MED ORDER — MIDAZOLAM HCL 2 MG/2ML IJ SOLN
INTRAMUSCULAR | Status: AC
  Filled 2020-10-06: qty 2

## 2020-10-06 MED ORDER — DEXAMETHASONE SODIUM PHOSPHATE 10 MG/ML IJ SOLN
INTRAMUSCULAR | Status: AC
  Filled 2020-10-06: qty 1

## 2020-10-06 MED ORDER — PROPOFOL 10 MG/ML IV BOLUS
INTRAVENOUS | Status: AC
  Filled 2020-10-06: qty 20

## 2020-10-06 MED ORDER — OXYBUTYNIN CHLORIDE 5 MG PO TABS
5.0000 mg | ORAL_TABLET | Freq: Three times a day (TID) | ORAL | 1 refills | Status: DC | PRN
Start: 2020-10-06 — End: 2020-10-16

## 2020-10-06 MED ORDER — CIPROFLOXACIN IN D5W 400 MG/200ML IV SOLN
INTRAVENOUS | Status: AC
  Filled 2020-10-06: qty 200

## 2020-10-06 MED ORDER — IOHEXOL 300 MG/ML  SOLN
INTRAMUSCULAR | Status: DC | PRN
  Administered 2020-10-06: 6 mL

## 2020-10-06 MED ORDER — IOHEXOL 300 MG/ML  SOLN
100.0000 mL | Freq: Once | INTRAMUSCULAR | Status: AC | PRN
  Administered 2020-10-06: 100 mL via INTRAVENOUS

## 2020-10-06 MED ORDER — ONDANSETRON HCL 4 MG PO TABS: 4.0000 mg | ORAL_TABLET | Freq: Every day | ORAL | 1 refills | Status: DC | PRN

## 2020-10-06 MED ORDER — ONDANSETRON HCL 4 MG/2ML IJ SOLN
INTRAMUSCULAR | Status: AC
  Filled 2020-10-06: qty 2

## 2020-10-06 MED ORDER — ONDANSETRON HCL 4 MG/2ML IJ SOLN
INTRAMUSCULAR | Status: DC | PRN
  Administered 2020-10-06: 4 mg via INTRAVENOUS

## 2020-10-06 MED ORDER — CIPROFLOXACIN HCL 500 MG PO TABS: 500.0000 mg | ORAL_TABLET | Freq: Two times a day (BID) | ORAL | 0 refills | Status: AC

## 2020-10-06 MED ORDER — FENTANYL CITRATE (PF) 100 MCG/2ML IJ SOLN
INTRAMUSCULAR | Status: AC
  Filled 2020-10-06: qty 2

## 2020-10-06 MED ORDER — HYDROMORPHONE HCL 1 MG/ML IJ SOLN
1.0000 mg | Freq: Once | INTRAMUSCULAR | Status: AC
  Administered 2020-10-06: 1 mg via INTRAVENOUS
  Filled 2020-10-06: qty 1

## 2020-10-06 SURGICAL SUPPLY — 13 items
BAG URO CATCHER STRL LF (MISCELLANEOUS) ×3 IMPLANT
CATH URET 5FR 28IN OPEN ENDED (CATHETERS) ×3 IMPLANT
CLOTH BEACON ORANGE TIMEOUT ST (SAFETY) ×3 IMPLANT
GLOVE BIOGEL M STRL SZ7.5 (GLOVE) ×3 IMPLANT
GOWN STRL REUS W/TWL XL LVL3 (GOWN DISPOSABLE) ×3 IMPLANT
GUIDEWIRE STR DUAL SENSOR (WIRE) IMPLANT
GUIDEWIRE ZIPWRE .038 STRAIGHT (WIRE) ×3 IMPLANT
KIT TURNOVER KIT A (KITS) IMPLANT
MANIFOLD NEPTUNE II (INSTRUMENTS) ×3 IMPLANT
PACK CYSTO (CUSTOM PROCEDURE TRAY) ×3 IMPLANT
STENT URET 6FRX24 CONTOUR (STENTS) ×3 IMPLANT
TUBING CONNECTING 10 (TUBING) ×2 IMPLANT
TUBING CONNECTING 10' (TUBING) ×1

## 2020-10-06 NOTE — ED Provider Notes (Signed)
Patient presented to the emergency department for evaluation of flank pain.  Patient was signed out to me by Dr. Rush Landmark to follow-up on CT scan.  Patient had recent hospitalization complicated by retroperitoneal hematoma secondary to anticoagulation.  CT scan has been performed and shows that the patient has a ureter stone causing hydronephrosis that explains his symptoms, no complications from previous hematoma.  Patient provided analgesia here in the emergency department, will discharge with analgesia, Flomax and follow-up with urology.   Gilda Crease, MD 10/06/20 [phone]

## 2020-10-06 NOTE — Op Note (Signed)
Operative Note  Preoperative diagnosis:  1.  Obstructing 6 mm left mid ureteral calculus 2.  Acute cystitis  Postoperative diagnosis: 1.  Obstructing 6 mm left mid ureteral calculus 2.  Acute cystitis  Procedure(s): 1.  Cystoscopy with left ureteral stent placement 2.  Left retrograde pyelogram with intraoperative interpretation of fluoroscopic imaging  Surgeon: Rhoderick Moody, MD  Assistants:  None  Anesthesia:  General  Complications:  None  EBL: Less than 5 mL  Specimens: 1.  None  Drains/Catheters: 1.  Left 6 French, 24 cm JJ stent without tether  Intraoperative findings:   1. Left retrograde pyelogram revealed a filling defect within the distal aspects of the left ureter, consistent with the obstructing stone seen on recent CT scan.  There is uniform dilation of the more proximal aspects of the left ureter and renal pelvis.  No other filling defects were seen along the remaining left ureter or within the renal pelvis. 2. No intravesical abnormalities were identified on cystoscopy  Indication:  Henry May is a 54 y.o. male with an obstructing 6 mm left distal ureteral calculus associated with hydronephrosis, renal colic and acute cystitis.  The patient has been consented for the above procedures, voices understanding and wishes to proceed.  Description of procedure:  After informed consent was obtained, the patient was brought to the operating room and general LMA anesthesia was administered. The patient was then placed in the dorsolithotomy position and prepped and draped in the usual sterile fashion. A timeout was performed. A 23 French rigid cystoscope was then inserted into the urethral meatus and advanced into the bladder under direct vision. A complete bladder survey revealed no intravesical pathology.  A 5 French ureteral catheter was then inserted into the left ureteral orifice and a retrograde pyelogram was obtained, with the findings listed above.  A  Glidewire was then used to intubate the lumen of the ureteral catheter and was advanced up to the left renal pelvis, under fluoroscopic guidance.  The catheter was then removed, leaving the wire in place.  A 6 French, 24 cm JJ stent was then placed over the wire and into good position within the left collecting system, confirming placement via fluoroscopy.  No purulent debris was seen draining from the left collecting system following stent placement.  The patient's bladder was then drained.  Plan: Urine culture is pending.  I will place him on an empiric course of Cipro with tentative plans for definitive stone treatment in approximately 2 weeks.

## 2020-10-06 NOTE — Anesthesia Postprocedure Evaluation (Signed)
Anesthesia Post Note  Patient: Henry May  Procedure(s) Performed: CYSTOSCOPY WITH STENT PLACEMENT (Left Ureter)     Patient location during evaluation: PACU Anesthesia Type: General Level of consciousness: sedated and patient cooperative Pain management: pain level controlled Vital Signs Assessment: post-procedure vital signs reviewed and stable Respiratory status: spontaneous breathing Cardiovascular status: stable Anesthetic complications: no   No complications documented.  Last Vitals:  Vitals:   10/06/20 1615 10/06/20 1620  BP: 130/85 134/85  Pulse: 70 67  Resp: (!) 22 20  Temp: 36.6 C 36.6 C  SpO2: 99% 94%    Last Pain:  Vitals:   10/06/20 1620  TempSrc:   PainSc: 0-No pain                 Lewie Loron

## 2020-10-06 NOTE — Anesthesia Preprocedure Evaluation (Signed)
Anesthesia Evaluation  Patient identified by MRN, date of birth, ID band Patient awake    Reviewed: Allergy & Precautions, NPO status , Patient's Chart, lab work & pertinent test results  Airway Mallampati: II  TM Distance: >3 FB Neck ROM: Full    Dental  (+) Edentulous Upper, Edentulous Lower   Pulmonary pneumonia,    Pulmonary exam normal breath sounds clear to auscultation       Cardiovascular negative cardio ROS   Rhythm:Regular Rate:Tachycardia     Neuro/Psych negative neurological ROS     GI/Hepatic negative GI ROS, Neg liver ROS,   Endo/Other  negative endocrine ROS  Renal/GU Renal disease     Musculoskeletal negative musculoskeletal ROS (+)   Abdominal   Peds  Hematology negative hematology ROS (+)   Anesthesia Other Findings   Reproductive/Obstetrics                             Anesthesia Physical Anesthesia Plan  ASA: II  Anesthesia Plan: General   Post-op Pain Management:    Induction: Intravenous  PONV Risk Score and Plan: 3 and Ondansetron, Dexamethasone, Treatment may vary due to age or medical condition and Midazolam  Airway Management Planned: LMA  Additional Equipment:   Intra-op Plan:   Post-operative Plan: Extubation in OR  Informed Consent: I have reviewed the patients History and Physical, chart, labs and discussed the procedure including the risks, benefits and alternatives for the proposed anesthesia with the patient or authorized representative who has indicated his/her understanding and acceptance.     Dental advisory given  Plan Discussed with: CRNA  Anesthesia Plan Comments:         Anesthesia Quick Evaluation

## 2020-10-06 NOTE — Transfer of Care (Signed)
Immediate Anesthesia Transfer of Care Note  Patient: Henry May  Procedure(s) Performed: Procedure(s): CYSTOSCOPY WITH STENT PLACEMENT (Left)  Patient Location: PACU  Anesthesia Type:General  Level of Consciousness: Alert, Awake, Oriented  Airway & Oxygen Therapy: Patient Spontanous Breathing  Post-op Assessment: Report given to RN  Post vital signs: Reviewed and stable  Last Vitals:  Vitals:   10/06/20 1324  BP: (!) 147/92  Pulse: (!) 106  Resp: 20  Temp: 36.4 C  SpO2: 95%    Complications: No apparent anesthesia complications

## 2020-10-06 NOTE — H&P (Signed)
PRE-OP H&P  Office Visit Report     10/06/2020   --------------------------------------------------------------------------------   Henry May  MRN: [id number]  DOB: Mar 10, 1966, 54 year old Male  SSN:    PRIMARY CARE:    REFERRING:    PROVIDER:  Rhoderick Moody, M.D.  LOCATION:  [facility] [phone]     --------------------------------------------------------------------------------   CC: I have pain in the flank.  HPI: Henry May is a 54 year-old male patient who is here for flank pain.  The problem is on the left side. His pain started about 10/05/2020. The pain is sharp. The intensity of his pain is rated as a 10. The pain is intermittent. The pain does not radiate.   Resting and pain medications< makes the pain better. Nothing causes the pain to become worse. He was treated with the following pain medication(s): Percocet.   He has not had this same pain previously. He has not had kidney stones.   -The patient was seen in the emergency department on 10/05/2020 and was found to have an obstructing left distal ureteral stone measuring approximately 6 mm. He is also noted to have a 5 mm nonobstructing right renal stone on CT. The patient had a CT on 08/23/2020 that showed an obstructing left ureteral stone, but there was no mention of it in his final CT report.  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 Hospital course complicated by progressive respiratory failure along with hypotension/PEA (secondary to hemorrhagic shock) requiring intubation and CPR with ROSC 08/22/2020.     ALLERGIES: None   MEDICATIONS: Tamsulosin Hcl 0.4 mg capsule  Oxycodone-Acetaminophen 5 mg-325 mg tablet     GU PSH: None   NON-GU PSH: None   GU PMH: None   NON-GU PMH: Hypertension    FAMILY HISTORY: None   SOCIAL HISTORY: Marital Status:  Married Current Smoking Status: Patient has never smoked.   Tobacco Use Assessment Completed: Used Tobacco in last 30 days? Has never drank.  Drinks 1 caffeinated drink per day. 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    REVIEW OF SYSTEMS:    GU Review Male:   Patient reports get up at night to urinate. Patient denies hard to postpone urination, burning/ pain with urination, leakage of urine, stream starts and stops, trouble starting your stream, have to strain to urinate , erection problems, and penile pain.  Gastrointestinal (Upper):   Patient denies nausea, vomiting, and indigestion/ heartburn.  Gastrointestinal (Lower):   Patient denies diarrhea and constipation.  Constitutional:   Patient reports fatigue and weight loss. Patient denies fever and night sweats.  Skin:   Patient denies skin rash/ lesion and itching.  Eyes:   Patient denies blurred vision and double vision.  Ears/ Nose/ Throat:   Patient denies sore throat and sinus problems.  Hematologic/Lymphatic:   Patient denies swollen glands and easy bruising.  Cardiovascular:   Patient denies leg swelling and chest pains.  Respiratory:   Patient reports cough. Patient denies shortness of breath.  Endocrine:   Patient denies excessive thirst.  Musculoskeletal:   Patient denies back pain and joint pain.  Neurological:   Patient denies headaches and dizziness.  Psychologic:   Patient denies depression and anxiety.   Notes: Pt c/o hematuria    VITAL SIGNS:      10/06/2020 09:44 AM  Weight 198 lb / 89.81 kg  Height 67 in / 170.18 cm  BP 116/79 mmHg  Heart Rate 96 /min  Temperature 97.5 F / 36.3 C  BMI 31.0 kg/m   MULTI-SYSTEM PHYSICAL EXAMINATION:    Constitutional: Well-nourished. No physical deformities. Normally developed. Good grooming.  Respiratory: No labored breathing, no use of accessory muscles.   Cardiovascular: Normal temperature, normal extremity pulses, no swelling, no varicosities.  Neurologic /  Psychiatric: Oriented to time, oriented to place, oriented to person. No depression, no anxiety, no agitation.  Musculoskeletal: Normal gait and station of head and neck.     Complexity of Data:  X-Ray Review: C.T. Abdomen/Pelvis: Reviewed Films. Reviewed Report. Discussed With Patient.    Notes:                     CLINICAL DATA: 54 year old male with history of kidney stone and  recent retroperitoneal hematoma presenting with flank pain.     EXAM:  CT ABDOMEN AND PELVIS WITHOUT AND WITH CONTRAST     TECHNIQUE:  Multidetector CT imaging of the abdomen and pelvis was performed  following the standard protocol before and following the bolus  administration of intravenous contrast.     CONTRAST: OMNIPAQUE IOHEXOL 300 MG/ML SOLN     COMPARISON: CT abdomen pelvis dated 08/23/2020.     FINDINGS:  Lower chest: Diffuse interstitial coarsening and pulmonary fibrosis  versus atypical infiltrate. Clinical correlation is recommended.  Overall slight interval improvement of the pulmonary densities  compared to the prior CT.     No intra-abdominal free air or free fluid.     Hepatobiliary: Mild fatty liver. No intrahepatic biliary ductal  dilatation. The gallbladder is unremarkable.     Pancreas: Unremarkable. No pancreatic ductal dilatation or  surrounding inflammatory changes.     Spleen: Normal in size without focal abnormality.     Adrenals/Urinary Tract: The adrenal glands unremarkable. Two  nonobstructing right renal calculi measure up to 6 mm in the  interpolar right kidney. There is no hydronephrosis on the right.  There is a 6 mm stone in the distal left ureter with mild left  hydronephrosis. No stone identified within the left kidney. There is  delayed enhancement and excretion of contrast by the left kidney.  The urinary bladder is predominantly collapsed.     Stomach/Bowel: There is no bowel obstruction or active inflammation.  The appendix is not visualized with  certainty. No inflammatory  changes identified in the right lower quadrant.     Vascular/Lymphatic: Abdominal aorta is unremarkable. An infrarenal  IVC filter is noted. The SMV, splenic vein, and main portal vein are  patent. No portal venous gas. There is no adenopathy.     Reproductive: The prostate and seminal vesicles are grossly  unremarkable. No pelvic mass. Bilateral vasectomies.     Other: Significant interval decrease in the size of the previously  seen left retroperitoneal hematoma. Residual blood product remains  measuring 2.5 x 8.5 cm in greatest axial dimension.     Musculoskeletal: No acute or significant osseous findings.     IMPRESSION:  1. A 6 mm distal left ureteral stone with mild left hydronephrosis.  Correlation with urinalysis recommended to exclude superimposed UTI.  2. Nonobstructing right renal calculi. No hydronephrosis on the  right.  3. Significant interval decrease in the size of the previously seen  left retroperitoneal hematoma.  4. No bowel obstruction.        Electronically Signed  By: Elgie Collard M.D.  On: 10/06/2020 00:46   PROCEDURES:  Urinalysis w/Scope Dipstick Dipstick Cont'd Micro  Color: Brown Bilirubin: Neg mg/dL WBC/hpf: 10 - 22/QJF  Appearance: Turbid Ketones: Trace mg/dL RBC/hpf: Packed/hpf  Specific Gravity: 1.025 Blood: 3+ ery/uL Bacteria: NS (Not Seen)  pH: 5.5 Protein: 3+ mg/dL Cystals: NS (Not Seen)  Glucose: Neg mg/dL Urobilinogen: 0.2 mg/dL Casts: NS (Not Seen)    Nitrites: Positive Trichomonas: Not Present    Leukocyte Esterase: 3+ leu/uL Mucous: Not Present      Epithelial Cells: NS (Not Seen)      Yeast: NS (Not Seen)      Sperm: Not Present    Notes: unspun microscopic performed          Morphine 4mg  - [id number], Qty: 4 Adm. By: [id number]  Unit: mg Lot No Angelena Form  Route: IM Exp. Date 09/15/2021  Freq: None Mfgr.:   Site: Right Buttock         Phenergan 25mg  - J2550, 11/15/2021 Qty: 12.5  Adm. By:  Unit: mg Lot No [id number]  Route: IM Exp. Date 08/16/2021  Freq: None Mfgr.:   Site: Left Buttock   ASSESSMENT:      ICD-10 Details  1 GU:   Flank Pain - R10.84 Left, Undiagnosed New Problem  2   Acute Cystitis/UTI - N30.00 Undiagnosed New Problem  3   Ureteral calculus - N20.1 Left, Undiagnosed New Problem  4   Ureteral obstruction secondary to calculous - N13.2 Left, Undiagnosed New Problem   PLAN:           Orders Labs Urine Culture          Schedule Return Visit/Planned Activity: ASAP - Schedule Surgery          Document Letter(s):  Created for Patient: Clinical Summary         Notes:   -The risks, benefits and alternatives of cystoscopy with LEFT JJ stent placement was discussed with the patient. Risks include, but are not limited to: bleeding, urinary tract infection, ureteral injury, ureteral stricture disease, chronic pain, urinary symptoms, bladder injury, stent migration, the need for nephrostomy tube placement, MI, CVA, DVT, PE and the inherent risks with general anesthesia. The patient voices understanding and wishes to proceed.

## 2020-10-06 NOTE — Anesthesia Procedure Notes (Signed)
Procedure Name: LMA Insertion Date/Time: 10/06/2020 2:53 PM Performed by: Basilio Cairo, CRNA Pre-anesthesia Checklist: Patient identified, Patient being monitored, Timeout performed, Emergency Drugs available and Suction available Patient Re-evaluated:Patient Re-evaluated prior to induction Oxygen Delivery Method: Circle system utilized Preoxygenation: Pre-oxygenation with 100% oxygen Induction Type: IV induction Ventilation: Mask ventilation without difficulty LMA: LMA inserted and LMA with gastric port inserted LMA Size: 4.0 Tube type: Oral Number of attempts: 1 Placement Confirmation: positive ETCO2 and breath sounds checked- equal and bilateral Tube secured with: Tape Dental Injury: Teeth and Oropharynx as per pre-operative assessment

## 2020-10-07 ENCOUNTER — Encounter (HOSPITAL_COMMUNITY): Payer: Self-pay | Admitting: Urology

## 2020-10-07 LAB — URINE CULTURE: Culture: <10000 — AB

## 2020-10-11 ENCOUNTER — Other Ambulatory Visit: Payer: Self-pay | Admitting: Urology

## 2020-10-11 LAB — CULTURE, BLOOD (ROUTINE X 2)
Culture: NO GROWTH
Culture: NO GROWTH

## 2020-10-16 ENCOUNTER — Other Ambulatory Visit: Payer: Self-pay

## 2020-10-16 ENCOUNTER — Encounter (HOSPITAL_BASED_OUTPATIENT_CLINIC_OR_DEPARTMENT_OTHER): Payer: Self-pay | Admitting: Urology

## 2020-10-16 NOTE — Progress Notes (Signed)
Spoke w/ via phone for pre-op interview---WIFE [facility]  Lab needs dos----   I STAT 8            Lab results------EKG 08-22-2020 Epic, ECHO 08-26-2020 Epic  COVID test ------POSITIVE 08-06-2020 Arrive at ------715 AM 10-25-2020- NPO after MN NO Solid Food.  Clear liquids from MN until---615 AM Medications to take morning of surgery -----METORPOLOL TARTRATE Diabetic medication -----N/A Patient Special Instructions -----NONE Pre-Op special Istructions -----NONE Patient verbalized understanding of instructions that were given at this phone interview. Patient denies shortness of breath, chest pain, fever, cough at this phone interview.  SPOKE WITH CONNIE AT [facility] AND PATIENT WITH RECENT MI, DVT,  RESPIRATORY FAILURE , NEEDS TO BE MOVED TO [facility] MAIN, CONNIE TO MAKE DR Dale Sherburne,

## 2020-10-17 ENCOUNTER — Telehealth: Payer: Self-pay

## 2020-10-17 NOTE — Telephone Encounter (Signed)
   [facility] Pre-operative Risk Assessment    HEARTCARE STAFF: - Please ensure there is not already an duplicate clearance open for this procedure. - Under Visit Info/Reason for Call, type in Other and utilize the format Clearance MM/DD/YY or Clearance TBD. Do not use dashes or single digits. - If request is for dental extraction, please clarify the # of teeth to be extracted.  Request for surgical clearance:  1. What type of surgery is being performed? Cystoscopy with Left JJ stent placement   2. When is this surgery scheduled? 10/25/20   3. What type of clearance is required (medical clearance vs. Pharmacy clearance to hold med vs. Both)? Medical   4. Are there any medications that need to be held prior to surgery and how long? None listed   5. Practice name and name of physician performing surgery? [facility], Dr. Harrell Gave Lovena Neighbours   6. What is the office phone number? [phone]   7.   What is the office fax number? [phone]  8.   Anesthesia type (None, local, MAC, general) ? Not listed   Jacqulynn Cadet 10/17/2020, 1:41 PM  _________________________________________________________________   (provider comments below)

## 2020-10-18 ENCOUNTER — Telehealth: Payer: Self-pay | Admitting: Physician Assistant

## 2020-10-18 NOTE — Telephone Encounter (Signed)
° °  Primary Cardiologist:  New to [facility]  Chart reviewed as part of pre-operative protocol coverage. Because of Izen Munsch's past medical history and time since last visit, he will require a follow-up visit in order to better assess preoperative cardiovascular risk.  Pre-op covering staff: - Please schedule appointment and call patient to inform them. If patient already had an upcoming appointment within acceptable timeframe, please add "pre-op clearance" to the appointment notes so provider is aware. - Please contact requesting surgeon's office via preferred method (i.e, phone, fax) to inform them of need for appointment prior to surgery.  If applicable, this message will also be routed to pharmacy pool and/or primary cardiologist for input on holding anticoagulant/antiplatelet agent as requested below so that this information is available to the clearing provider at time of patient's appointment.   [place], [place]  10/18/2020, 12:06 PM

## 2020-10-18 NOTE — Telephone Encounter (Addendum)
Gave clearance and packet to Henry May to get patient scheduled since it was also an urgent referral.

## 2020-10-18 NOTE — Telephone Encounter (Signed)
Left message for patient to call and scheduled pre op evaluation with cardiologist as requested by Dr. Rhoderick Moody at [facility]

## 2020-10-19 NOTE — Progress Notes (Signed)
Cardiology Office Note:    Date:  10/20/2020   ID:  Henry May, DOB [birth year], MRN [id number]  PCP:  Patient, No Pcp Per  [facility] Cardiologist:  New to [facility]   [facility] Electrophysiologist:  None   Referring MD: Henry May,*   Chief Complaint  Patient presents with  . Pre-op Exam  . Covid Positive     Nov. 5, 2021:    Seen with wife, Henry May is a 54 y.o. male with a hx of covid pneumonia , complicated by pneumonia, respiratory arrest and mechanical ventilation. He had a PEA arrest due to a large hemorrhagic retroperitoneal bleed. .   He has had  a ureteral stent placed Oct. 22, 2021 without complications  and needs to have his renal stones removed  and we are asked to see him today by Dr. Devoria Glassing for pre op assessment.    covid diagnosed on Aug. 16.  Along with wife  Hospitalized in Aug. 22, 2021 With Covid .  Had respiratory failure,  Required high flow O2 and BIPAP  Was hospitalized for a month .   Had left DVT, was started on heparin  He developed a PEA arrest ,  Lasted 4 minutes,  Received 1 dose epi, intubated , high dose pressors.  Was found to have a large  retroperitoneal bleed that caused a PEA arrest  Heparin was stopped.  Has an IVC filter placed   He is not on  DOAC   He had an echocardiogram from 08/26/2020 which revealed normal left ventricular systolic function. He has grade 2 diastolic dysfunction. No significant valvular abnormalities. He has mild mitral regurgitation.  Works as a Naval architect ,  Has not returned to work , still too fatigued.        Past Medical History:  Diagnosis Date  . Acute respiratory failure (HCC) 08/06/2020  . Chronic cough    SINCE 08-06-2020 COVID PNEUMONIA  . COVID 08/06/2020  . Dvt femoral (deep venous thrombosis) (HCC) 08/2020   LEFT LEG   . History of blood transfusion 08/2020   AFTER MI 5 UNITS GIVEN  . History of kidney stones   . Hypertension   . Numbness     LEFT LEG AT TIMES  . Pneumonia 08/06/2020   COVID PNEUMONIA    Past Surgical History:  Procedure Laterality Date  . CYSTOSCOPY WITH STENT PLACEMENT Left 10/06/2020   Procedure: CYSTOSCOPY WITH STENT PLACEMENT;  Surgeon: Rene Paci, MD;  Location: [facility];  Service: Urology;  Laterality: Left;  . IR IVC FILTER PLMT / S&I /IMG GUID/MOD SED  08/24/2020    Current Medications: Current Meds  Medication Sig  . acetaminophen (TYLENOL) 325 MG tablet Take 325-650 mg by mouth every 6 (six) hours as needed for mild pain, fever or headache.  . metoprolol tartrate (LOPRESSOR) 25 MG tablet Take 1 tablet (25 mg total) by mouth 2 (two) times daily.  . ondansetron (ZOFRAN) 4 MG tablet Take 1 tablet (4 mg total) by mouth daily as needed for nausea or vomiting.  Marland Kitchen oxyCODONE-acetaminophen (PERCOCET) 5-325 MG tablet Take 1-2 tablets by mouth every 4 (four) hours as needed.  . OXYGEN Inhale into the lungs. Uses o2 occ     Allergies:   Patient has no known allergies.   Social History   Socioeconomic History  . Marital status: Married    Spouse name: Not on file  . Number of children: Not on file  . Years  of education: Not on file  . Highest education level: Not on file  Occupational History  . Not on file  Tobacco Use  . Smoking status: Never Smoker  . Smokeless tobacco: Never Used  Vaping Use  . Vaping Use: Never used  Substance and Sexual Activity  . Alcohol use: Never  . Drug use: Never  . Sexual activity: Yes  Other Topics Concern  . Not on file  Social History Narrative  . Not on file   Social Determinants of Health   Financial Resource Strain:   . Difficulty of Paying Living Expenses: Not on file  Food Insecurity:   . Worried About Programme researcher, broadcasting/film/video in the Last Year: Not on file  . Ran Out of Food in the Last Year: Not on file  Transportation Needs:   . Lack of Transportation (Medical): Not on file  . Lack of Transportation (Non-Medical): Not on file   Physical Activity:   . Days of Exercise per Week: Not on file  . Minutes of Exercise per Session: Not on file  Stress:   . Feeling of Stress : Not on file  Social Connections:   . Frequency of Communication with Friends and Family: Not on file  . Frequency of Social Gatherings with Friends and Family: Not on file  . Attends Religious Services: Not on file  . Active Member of Clubs or Organizations: Not on file  . Attends Banker Meetings: Not on file  . Marital Status: Not on file     Family History: The patient's family history is negative for Diabetes Mellitus II.  ROS:   Please see the history of present illness.     All other systems reviewed and are negative.  EKGs/Labs/Other Studies Reviewed:    The following studies were reviewed today:   EKG:   August 22, 2020: Normal sinus rhythm.  Question of inferior wall myocardial infarction.  No ST or T wave changes.  Recent Labs: 08/19/2020: B Natriuretic Peptide 419.4 08/29/2020: ALT 692; Magnesium 1.8 10/05/2020: BUN 13; Creatinine, Ser 1.21; Hemoglobin 13.7; Platelets 355; Potassium 4.4; Sodium 136  Recent Lipid Panel    Component Value Date/Time   TRIG 242 (H) 08/06/2020 1933     Risk Assessment/Calculations:       Physical Exam:    VS:  BP 100/78   Pulse 88   Ht 6' (1.829 m)   Wt 188 lb 12.8 oz (85.6 kg)   SpO2 96%   BMI 25.61 kg/m     Wt Readings from Last 3 Encounters:  10/20/20 188 lb 12.8 oz (85.6 kg)  10/06/20 185 lb 9.6 oz (84.2 kg)  09/03/20 195 lb 5.2 oz (88.6 kg)     GEN:  Well nourished, well developed in no acute distress HEENT: Normal NECK: No JVD; No carotid bruits LYMPHATICS: No lymphadenopathy CARDIAC: RRR, no murmurs, rubs, gallops RESPIRATORY:  Clear to auscultation without rales, wheezing or rhonchi  ABDOMEN: Soft, non-tender, non-distended MUSCULOSKELETAL:  No edema; No deformity  SKIN: Warm and dry NEUROLOGIC:  Alert and oriented x 3 PSYCHIATRIC:  Normal affect    ASSESSMENT:    No diagnosis found. PLAN:    In order of problems listed above:  1. Cardiovascular risk prior to uroscopy: The patient had a prolonged and complicated hospitalization.  It started with a Covid infection with severe respiratory failure.  He had hypercoagulable state and developed a DVT in his left leg.  He was treated with IV heparin but subsequently  developed a large retroperitoneal hematoma.  This retroperitoneal bleed ultimately led to a PEA arrest with CPR.   The anticoagulation was stopped and an IVC filter was placed.  He was seen by pulmonary during that hospitalization but has not been seen by cardiology.  He was intubated.  He is gradually recovered. He remains fatigued.  This is probably expected from his prolonged hospitalization. His echocardiogram performed in September revealed normal left ventricular systolic function with minor valvular abnormalities.  He is already had a uroscopy and stent placement.  He needs to have another uroscopy with stone removal.  I have discussed the case with Dr. Liliane Shi.  The procedure will be about 30 minutes and will not be any more complicated than his uroscopy that he had 2 weeks ago.  At this point I  think that he is low risk  to have his uroscopy.  He does not have any cardiac issues. I will see him back in 3 months for follow-up visit.  2.  History of DVT: He has an IVC filter placed.  He will need to follow-up with pulmonary to discuss if and when the IVC filter needs to be removed.  He needs to get a primary medical doctor.   Shared Decision Making/Informed Consent      Medication Adjustments/Labs and Tests Ordered: Current medicines are reviewed at length with the patient today.  Concerns regarding medicines are outlined above.  No orders of the defined types were placed in this encounter.  No orders of the defined types were placed in this encounter.   Patient Instructions  Medication Instructions:  Your  physician recommends that you continue on your current medications as directed. Please refer to the Current Medication list given to you today.  *If you need a refill on your cardiac medications before your next appointment, please call your pharmacy*   Lab Work: None ordered   Testing/Procedures: None ordered   Follow-Up: At [facility], you and your health needs are our priority.  As part of our continuing mission to provide you with exceptional heart care, we have created designated Provider Care Teams.  These Care Teams include your primary Cardiologist (physician) and Advanced Practice Providers (APPs -  Physician Assistants and Nurse Practitioners) who all work together to provide you with the care you need, when you need it.   Your next appointment:   3-4 month(s)  The format for your next appointment:   In Person  Provider:   Kristeen Miss, MD    Thank you for choosing [facility]!!        Signed, Kristeen Miss, MD  10/20/2020 1:37 PM     [facility]

## 2020-10-20 ENCOUNTER — Other Ambulatory Visit: Payer: Self-pay

## 2020-10-20 ENCOUNTER — Encounter: Payer: Self-pay | Admitting: Cardiovascular Disease

## 2020-10-20 ENCOUNTER — Ambulatory Visit: Payer: BC Managed Care – PPO | Admitting: Cardiovascular Disease

## 2020-10-20 VITALS — BP 100/78 | HR 88 | Ht 72.0 in | Wt 188.8 lb

## 2020-10-20 DIAGNOSIS — I469 Cardiac arrest, cause unspecified: Secondary | ICD-10-CM | POA: Diagnosis not present

## 2020-10-20 DIAGNOSIS — I82403 Acute embolism and thrombosis of unspecified deep veins of lower extremity, bilateral: Secondary | ICD-10-CM

## 2020-10-20 NOTE — Patient Instructions (Signed)
Medication Instructions:  Your physician recommends that you continue on your current medications as directed. Please refer to the Current Medication list given to you today.  *If you need a refill on your cardiac medications before your next appointment, please call your pharmacy*   Lab Work: None ordered   Testing/Procedures: None ordered   Follow-Up: At [facility], you and your health needs are our priority.  As part of our continuing mission to provide you with exceptional heart care, we have created designated Provider Care Teams.  These Care Teams include your primary Cardiologist (physician) and Advanced Practice Providers (APPs -  Physician Assistants and Nurse Practitioners) who all work together to provide you with the care you need, when you need it.   Your next appointment:   3-4 month(s)  The format for your next appointment:   In Person  Provider:   Kristeen Miss, MD    Thank you for choosing [facility]!!

## 2020-10-21 DIAGNOSIS — R2689 Other abnormalities of gait and mobility: Secondary | ICD-10-CM | POA: Diagnosis not present

## 2020-10-21 DIAGNOSIS — M6281 Muscle weakness (generalized): Secondary | ICD-10-CM | POA: Diagnosis not present

## 2020-10-21 DIAGNOSIS — U071 COVID-19: Secondary | ICD-10-CM | POA: Diagnosis not present

## 2020-10-23 NOTE — Telephone Encounter (Signed)
Opened in error

## 2020-10-31 ENCOUNTER — Other Ambulatory Visit: Payer: Self-pay

## 2020-10-31 ENCOUNTER — Encounter: Payer: Self-pay | Admitting: Family Medicine

## 2020-10-31 ENCOUNTER — Ambulatory Visit: Payer: BC Managed Care – PPO | Admitting: Family Medicine

## 2020-10-31 VITALS — BP 126/68 | HR 76 | Ht 72.0 in | Wt 193.0 lb

## 2020-10-31 DIAGNOSIS — Z1211 Encounter for screening for malignant neoplasm of colon: Secondary | ICD-10-CM | POA: Diagnosis not present

## 2020-10-31 DIAGNOSIS — Z23 Encounter for immunization: Secondary | ICD-10-CM | POA: Diagnosis not present

## 2020-10-31 DIAGNOSIS — E119 Type 2 diabetes mellitus without complications: Secondary | ICD-10-CM | POA: Insufficient documentation

## 2020-10-31 DIAGNOSIS — Z1159 Encounter for screening for other viral diseases: Secondary | ICD-10-CM

## 2020-10-31 NOTE — Progress Notes (Signed)
   Subjective:    Patient ID: Henry May, male    DOB: 09/14/1966, 54 y.o.   MRN: [id number]   CC: Establish care  HPI:  [name] is a very pleasant 54 y.o. male who presents today to establish care.  Initial concerns:Previous hospitalization for kidney stone. Has surgery Nov 30th to remove kidney stone and urological stent.  Past medical history: Recent hospitalization for COVID-19 at the end of August, tested positive Aug 16.  Had a DVT, retroperitoneal hematoma and hypotension with subsequent arrest. Had an IVC filter placed. Patient did have an echo as well as a CT at that time. Echo showed ejection fraction 60-65% with grade 2 diastolic dysfunction. Still uses oxygen at times at home. Has not seen pulse ox under 93 when ambulating.   Past surgical history: IVC filter placed 2021  Current medications:Metorpolol 25mg .  Family history: History of breast cancer in patient's mother  Social history: Works as a . Never smoker, only one drink per month or so, no street drugs.   ROS: pertinent noted in the HPI   Objective:  BP 126/68   Pulse 76   Ht 6' (1.829 m)   Wt 193 lb (87.5 kg)   SpO2 98%   BMI 26.18 kg/m   Vitals and nursing note reviewed  General: NAD, pleasant, able to participate in exam Cardiac: RRR, S1 S2 present. normal heart sounds, no murmurs. Respiratory: CTAB, normal effort Abdomen: Bowel sounds present, non-tender, non-distended Extremities: no edema or cyanosis. Skin: warm and dry, no rashes noted Neuro: alert, no obvious focal deficits Psych: Normal affect and mood   Assessment & Plan:    Diabetes mellitus without complication [facility]) Assessment: 54 year old gentleman with a previous A1c during his last hospitalization of 6.5.  Patient is not currently on any medications for diabetes and is at goal.  Patient is not currently on a statin medication. Plan: -Discussed with patient that it is recommended for him to start a statin.   Patient request holding off until his surgical procedure in the next 1.5 to 2 weeks.  I think this is reasonable at this time. -We will check A1c today -We will check lipid panel today -Patient follow-up in about 2 weeks after surgical procedure at which point we will discuss starting a statin medication.  -We will also screen for hepatitis C.  Provided patient referral to gastroenterology for screening colonoscopy. -Provided patient with a work note to limit his lifting to 15 pounds until after his surgical procedure as he still has some pain from his previous hospitalization with his retroperitoneal hematoma.  57, DO [facility] PGY-1

## 2020-10-31 NOTE — Assessment & Plan Note (Signed)
Assessment: 54 year old gentleman with a previous A1c during his last hospitalization of 6.5.  Patient is not currently on any medications for diabetes and is at goal.  Patient is not currently on a statin medication. Plan: -Discussed with patient that it is recommended for him to start a statin.  Patient request holding off until his surgical procedure in the next 1.5 to 2 weeks.  I think this is reasonable at this time. -We will check A1c today -We will check lipid panel today -Patient follow-up in about 2 weeks after surgical procedure at which point we will discuss starting a statin medication.

## 2020-10-31 NOTE — Patient Instructions (Signed)
It was great to meet you!  Our plans for today:  -We are checking some blood work today.  I will let you know your results via MyChart when they are complete. -I would like for you to make a follow-up appointment with me once your surgery is complete to further discuss your health care. -I have sent a referral for a colonoscopy.  You can schedule this at your convenience and I recommend scheduling it after your surgery is complete. -I am also providing a work note to limit you from carrying more than 15 pounds until after your surgical procedure is complete.  We are checking some labs today, I would like for you to make a follow-up appointment in the next 3 to 4 days or next week to discuss the results of these labs as well as to talk in further detail about ways we can assist you with your health.  Take care and seek immediate care sooner if you develop any concerns.   Dr. [facility]

## 2020-11-01 LAB — LIPID PANEL
Chol/HDL Ratio: 7.6 ratio — ABNORMAL HIGH (ref 0.0–5.0)
Cholesterol, Total: 219 mg/dL — ABNORMAL HIGH (ref 100–199)
HDL: 29 mg/dL — ABNORMAL LOW (ref >39)
LDL Chol Calc (NIH): 127 mg/dL — ABNORMAL HIGH (ref 0–99)
Triglycerides: 352 mg/dL — ABNORMAL HIGH (ref 0–149)
VLDL Cholesterol Cal: 63 mg/dL — ABNORMAL HIGH (ref 5–40)

## 2020-11-01 LAB — HEMOGLOBIN A1C
Est. average glucose Bld gHb Est-mCnc: 114 mg/dL
Hgb A1c MFr Bld: 5.6 % (ref 4.8–5.6)

## 2020-11-01 LAB — HEPATITIS C ANTIBODY: Hep C Virus Ab: <0.1 s/co ratio (ref 0.0–0.9)

## 2020-11-03 ENCOUNTER — Ambulatory Visit: Payer: BC Managed Care – PPO

## 2020-11-03 NOTE — Patient Instructions (Addendum)
DUE TO COVID-19 ONLY ONE VISITOR IS ALLOWED TO COME WITH YOU AND STAY IN THE WAITING ROOM ONLY DURING PRE OP AND PROCEDURE DAY OF SURGERY. THE 1 VISITOR  MAY VISIT WITH YOU AFTER SURGERY IN YOUR PRIVATE ROOM DURING VISITING HOURS ONLY!  YOU NEED TO HAVE A COVID 19 TEST ON: 11/10/20 @ 9:00 AM, THIS TEST MUST BE DONE BEFORE SURGERY,  COVID TESTING SITE [address] [place] [address], IT IS ON THE RIGHT GOING OUT [address] APPROXIMATELY  2 MINUTES PAST [employer] ON THE RIGHT. ONCE YOUR COVID TEST IS COMPLETED,  PLEASE BEGIN THE QUARANTINE INSTRUCTIONS AS OUTLINED IN YOUR HANDOUT.                Henry May    Your procedure is scheduled on: 11/14/20   Report to [facility] Main  Entrance   Report to admitting at: 10:00 AM     Call this number if you have problems the morning of surgery [phone]    Remember: Do not eat solid food :After Midnight. Clear liquids until: 9:00 am.  CLEAR LIQUID DIET   Foods Allowed                                                                     Foods Excluded  Coffee and tea, regular and decaf                             liquids that you cannot  Plain Jell-O any favor except red or purple                                           see through such as: Fruit ices (not with fruit pulp)                                     milk, soups, orange juice  Iced Popsicles                                    All solid food Carbonated beverages, regular and diet                                    Cranberry, grape and apple juices Sports drinks like Gatorade Lightly seasoned clear broth or consume(fat free) Sugar, honey syrup  Sample Menu Breakfast                                Lunch                                     Supper Cranberry juice  Beef broth                            Chicken broth Jell-O                                     Grape juice                           Apple juice Coffee or tea                         Jell-O                                      Popsicle                                                Coffee or tea                        Coffee or tea  _____________________________________________________________________  BRUSH YOUR TEETH MORNING OF SURGERY AND RINSE YOUR MOUTH OUT, NO CHEWING GUM CANDY OR MINTS.     Take these medicines the morning of surgery with A SIP OF WATER: Metoprolol.                               You may not have any metal on your body including hair pins and              piercings  Do not wear jewelry, lotions, powders or perfumes, deodorant             Men may shave face and neck.   Do not bring valuables to the hospital. [facility] IS NOT             RESPONSIBLE   FOR VALUABLES.  Contacts, dentures or bridgework may not be worn into surgery.  Leave suitcase in the car. After surgery it may be brought to your room.     Patients discharged the day of surgery will not be allowed to drive home. IF YOU ARE HAVING SURGERY AND GOING HOME THE SAME DAY, YOU MUST HAVE AN ADULT TO DRIVE YOU HOME AND BE WITH YOU FOR 24 HOURS. YOU MAY GO HOME BY TAXI OR UBER OR ORTHERWISE, BUT AN ADULT MUST ACCOMPANY YOU HOME AND STAY WITH YOU FOR 24 HOURS.  Name and phone number of your driver:  Special Instructions: N/A              Please read over the following fact sheets you were given: ____________________________________________________________________          [facility] - Preparing for Surgery Before surgery, you can play an important role.  Because skin is not sterile, your skin needs to be as free of germs as possible.  You can reduce the number of germs on your skin by washing with CHG (chlorahexidine gluconate) soap before surgery.  CHG is an antiseptic cleaner which kills germs and bonds with the skin to continue killing germs  even after washing. Please DO NOT use if you have an allergy to CHG or antibacterial soaps.  If your skin becomes  reddened/irritated stop using the CHG and inform your nurse when you arrive at Short Stay. Do not shave (including legs and underarms) for at least 48 hours prior to the first CHG shower.  You may shave your face/neck. Please follow these instructions carefully:  1.  Shower with CHG Soap the night before surgery and the  morning of Surgery.  2.  If you choose to wash your hair, wash your hair first as usual with your  normal  shampoo.  3.  After you shampoo, rinse your hair and body thoroughly to remove the  shampoo.                           4.  Use CHG as you would any other liquid soap.  You can apply chg directly  to the skin and wash                       Gently with a scrungie or clean washcloth.  5.  Apply the CHG Soap to your body ONLY FROM THE NECK DOWN.   Do not use on face/ open                           Wound or open sores. Avoid contact with eyes, ears mouth and genitals (private parts).                       Wash face,  Genitals (private parts) with your normal soap.             6.  Wash thoroughly, paying special attention to the area where your surgery  will be performed.  7.  Thoroughly rinse your body with warm water from the neck down.  8.  DO NOT shower/wash with your normal soap after using and rinsing off  the CHG Soap.                9.  Pat yourself dry with a clean towel.            10.  Wear clean pajamas.            11.  Place clean sheets on your bed the night of your first shower and do not  sleep with pets. Day of Surgery : Do not apply any lotions/deodorants the morning of surgery.  Please wear clean clothes to the hospital/surgery center.  FAILURE TO FOLLOW THESE INSTRUCTIONS MAY RESULT IN THE CANCELLATION OF YOUR SURGERY PATIENT SIGNATURE_________________________________  NURSE SIGNATURE__________________________________  ________________________________________________________________________

## 2020-11-04 DIAGNOSIS — U071 COVID-19: Secondary | ICD-10-CM | POA: Diagnosis not present

## 2020-11-06 ENCOUNTER — Encounter (HOSPITAL_COMMUNITY)
Admission: RE | Admit: 2020-11-06 | Discharge: 2020-11-06 | Disposition: A | Discharge status: Home / Self Care | Payer: BC Managed Care – PPO | Source: Ambulatory Visit | Attending: Urology | Admitting: Urology

## 2020-11-06 ENCOUNTER — Encounter (HOSPITAL_COMMUNITY): Payer: Self-pay

## 2020-11-06 ENCOUNTER — Other Ambulatory Visit: Payer: Self-pay

## 2020-11-06 ENCOUNTER — Other Ambulatory Visit: Payer: Self-pay | Admitting: Family Medicine

## 2020-11-06 DIAGNOSIS — I10 Essential (primary) hypertension: Secondary | ICD-10-CM | POA: Diagnosis not present

## 2020-11-06 DIAGNOSIS — Z95828 Presence of other vascular implants and grafts: Secondary | ICD-10-CM | POA: Diagnosis not present

## 2020-11-06 DIAGNOSIS — Z79899 Other long term (current) drug therapy: Secondary | ICD-10-CM | POA: Diagnosis not present

## 2020-11-06 DIAGNOSIS — Z8616 Personal history of COVID-19: Secondary | ICD-10-CM | POA: Insufficient documentation

## 2020-11-06 DIAGNOSIS — Z01812 Encounter for preprocedural laboratory examination: Secondary | ICD-10-CM | POA: Diagnosis not present

## 2020-11-06 DIAGNOSIS — N201 Calculus of ureter: Secondary | ICD-10-CM | POA: Insufficient documentation

## 2020-11-06 DIAGNOSIS — Z960 Presence of urogenital implants: Secondary | ICD-10-CM | POA: Insufficient documentation

## 2020-11-06 DIAGNOSIS — Z86718 Personal history of other venous thrombosis and embolism: Secondary | ICD-10-CM | POA: Insufficient documentation

## 2020-11-06 DIAGNOSIS — Z9981 Dependence on supplemental oxygen: Secondary | ICD-10-CM | POA: Insufficient documentation

## 2020-11-06 LAB — BASIC METABOLIC PANEL
Anion gap: 10 (ref 5–15)
BUN: 14 mg/dL (ref 6–20)
CO2: 25 mmol/L (ref 22–32)
Calcium: 9.7 mg/dL (ref 8.9–10.3)
Chloride: 103 mmol/L (ref 98–111)
Creatinine, Ser: 0.93 mg/dL (ref 0.61–1.24)
GFR, Estimated: >60 mL/min (ref >60)
Glucose, Bld: 97 mg/dL (ref 70–99)
Potassium: 4.9 mmol/L (ref 3.5–5.1)
Sodium: 138 mmol/L (ref 135–145)

## 2020-11-06 LAB — CBC
HCT: 42.8 % (ref 39.0–52.0)
Hemoglobin: 13.8 g/dL (ref 13.0–17.0)
MCH: 29.6 pg (ref 26.0–34.0)
MCHC: 32.2 g/dL (ref 30.0–36.0)
MCV: 91.6 fL (ref 80.0–100.0)
Platelets: 251 10*3/uL (ref 150–400)
RBC: 4.67 MIL/uL (ref 4.22–5.81)
RDW: 14.6 % (ref 11.5–15.5)
WBC: 8 10*3/uL (ref 4.0–10.5)
nRBC: 0 % (ref 0.0–0.2)

## 2020-11-06 NOTE — Progress Notes (Addendum)
COVID Vaccine Completed: Yes Date COVID Vaccine completed: 10/31/20 COVID vaccine manufacturer: Pfizer     PCP - DO: Jackelyn Poling. LOV: 10/31/20 Cardiologist - Dr. Kristeen Miss. LOV: 10/20/20: Clearance. : EPIC  Chest x-ray - CT chest: 08/26/20: EPIC EKG - 08/22/20 Stress Test -  ECHO - 08/26/20 Cardiac Cath -  Pacemaker/ICD device last checked: A1-C: 11/01/20: 6.5 Sleep Study -  CPAP -   Fasting Blood Sugar -  Checks Blood Sugar _____ times a day  Blood Thinner Instructions: Aspirin Instructions: Last Dose:  Anesthesia review: Hx: HTN,Respiratory arrest post COVID,PEA,DVT. Pt. Still recovering,gets easily fatigue after walking few steps.  Patient denies shortness of breath, fever, cough and chest pain at PAT appointment   Patient verbalized understanding of instructions that were given to them at the PAT appointment. Patient was also instructed that they will need to review over the PAT instructions again at home before surgery.

## 2020-11-07 NOTE — Anesthesia Preprocedure Evaluation (Deleted)
Anesthesia Evaluation  Patient identified by MRN, date of birth, ID band Patient awake    Reviewed: Allergy & Precautions, NPO status , Patient's Chart, lab work & pertinent test results  Airway Mallampati: II  TM Distance: >3 FB Neck ROM: Full    Dental no notable dental hx.    Pulmonary neg pulmonary ROS,    Pulmonary exam normal breath sounds clear to auscultation       Cardiovascular hypertension, Pt. on medications and Pt. on home beta blockers Normal cardiovascular exam Rhythm:Regular Rate:Normal  ECHO 9/21  Left Ventricle: Left ventricular ejection fraction, by estimation, is 60  to 65%. The left ventricle has normal function. The left ventricle has no  regional wall motion abnormalities. Global longitudinal strain performed  but not reported based on  interpreter judgement due to suboptimal tracking. The left ventricular  internal cavity size was mildly dilated. There is no left ventricular  hypertrophy. Left ventricular diastolic parameters are consistent with  Grade II diastolic dysfunction  (pseudonormalization). Normal left ventricular filling pressure.    Neuro/Psych negative neurological ROS  negative psych ROS   GI/Hepatic negative GI ROS, Neg liver ROS,   Endo/Other  diabetes, Type 2  Renal/GU Renal disease  negative genitourinary   Musculoskeletal negative musculoskeletal ROS (+)   Abdominal   Peds negative pediatric ROS (+)  Hematology negative hematology ROS (+)   Anesthesia Other Findings   Reproductive/Obstetrics negative OB ROS                                                             Anesthesia Evaluation  Patient identified by MRN, date of birth, ID band Patient awake    Reviewed: Allergy & Precautions, NPO status , Patient's Chart, lab work & pertinent test results  Airway Mallampati: II  TM Distance: >3 FB Neck ROM: Full    Dental  (+)  Edentulous Upper, Edentulous Lower   Pulmonary pneumonia,    Pulmonary exam normal breath sounds clear to auscultation       Cardiovascular negative cardio ROS   Rhythm:Regular Rate:Tachycardia     Neuro/Psych negative neurological ROS     GI/Hepatic negative GI ROS, Neg liver ROS,   Endo/Other  negative endocrine ROS  Renal/GU Renal disease     Musculoskeletal negative musculoskeletal ROS (+)   Abdominal   Peds  Hematology negative hematology ROS (+)   Anesthesia Other Findings   Reproductive/Obstetrics                             Anesthesia Physical Anesthesia Plan  ASA: II  Anesthesia Plan: General   Post-op Pain Management:    Induction: Intravenous  PONV Risk Score and Plan: 3 and Ondansetron, Dexamethasone, Treatment may vary due to age or medical condition and Midazolam  Airway Management Planned: LMA  Additional Equipment:   Intra-op Plan:   Post-operative Plan: Extubation in OR  Informed Consent: I have reviewed the patients History and Physical, chart, labs and discussed the procedure including the risks, benefits and alternatives for the proposed anesthesia with the patient or authorized representative who has indicated his/her understanding and acceptance.     Dental advisory given  Plan Discussed with: CRNA  Anesthesia Plan Comments:  Anesthesia Quick Evaluation  Anesthesia Physical Anesthesia Plan  ASA: II  Anesthesia Plan: General   Post-op Pain Management:    Induction: Intravenous  PONV Risk Score and Plan:   Airway Management Planned: LMA  Additional Equipment:   Intra-op Plan:   Post-operative Plan:   Informed Consent:   Plan Discussed with: CRNA and Surgeon  Anesthesia Plan Comments: (See PAT note 11/06/2020, Jodell Cipro, PA-C)       Anesthesia Quick Evaluation

## 2020-11-07 NOTE — Progress Notes (Signed)
Anesthesia Chart Review   Case: [id number] Date/Time: 11/14/20 1145   Procedure: CYSTOSCOPY/URETEROSCOPY/HOLMIUM LASER/STENT PLACEMENT (Left ) - ONLY NEEDS 45 MIN   Anesthesia type: General   Pre-op diagnosis: LEFT URETERAL STONE   Location: [facility] PROCEDURE ROOM / [facility]   Surgeons: Rene Paci, MD      DISCUSSION:54 y.o. never smoker with h/o HTN, DVT s/p IVC filter placement, left ureteral stone scheduled for above procedure 11/14/2020 with Dr. Cristal Deer Liliane Shi.   Pt last seen by PCP 10/31/2020.  Stable at this visit.   Last seen by cardiology 10/20/2020.  Per OV note, "Cardiovascular risk prior to uroscopy: The patient had a prolonged and complicated hospitalization.  It started with a Covid infection with severe respiratory failure.  He had hypercoagulable state and developed a DVT in his left leg.  He was treated with IV heparin but subsequently developed a large retroperitoneal hematoma.  This retroperitoneal bleed ultimately led to a PEA arrest with CPR.   The anticoagulation was stopped and an IVC filter was placed.  He was seen by pulmonary during that hospitalization but has not been seen by cardiology. He was intubated.  He is gradually recovered. He remains fatigued.  This is probably expected from his prolonged hospitalization. His echocardiogram performed in September revealed normal left ventricular systolic function with minor valvular abnormalities. He is already had a uroscopy and stent placement.  He needs to have another uroscopy with stone removal. I have discussed the case with Dr. Liliane Shi.  The procedure will be about 30 minutes and will not be any more complicated than his uroscopy that he had 2 weeks ago. At this point I  think that he is low risk  to have his uroscopy.  He does not have any cardiac issues. I will see him back in 3 months for follow-up visit."  S/p cystoscopy 10/06/20 with no anesthesia complications noted.   Anticipate pt can proceed with  planned procedure barring acute status change.   VS: BP 129/89   Pulse 70   Temp 36.7 C (Oral)   Ht 6' (1.829 m)   Wt 86.6 kg   SpO2 97%   BMI 25.90 kg/m   PROVIDERS: Jackelyn Poling, DO is PCP   Kristeen Miss, MD is Cardiologist  LABS: Labs reviewed: Acceptable for surgery. (all labs ordered are listed, but only abnormal results are displayed)  Labs Reviewed  BASIC METABOLIC PANEL  CBC     IMAGES:   EKG: 08/22/2020 Rate 85 bpm  Normal sinus rhythm Cannot rule out Inferior infarct , age undetermined Abnormal ECG No significant change since last tracing  CV: Echo 08/26/2020 IMPRESSIONS    1. Left ventricular ejection fraction, by estimation, is 60 to 65%. The  left ventricle has normal function. The left ventricle has no regional  wall motion abnormalities. The left ventricular internal cavity size was  mildly dilated. Left ventricular  diastolic parameters are consistent with Grade II diastolic dysfunction  (pseudonormalization). LV longitudinal strain was performed by not  accurated due to inappropriate tracking.  2. Right ventricular systolic function is normal. The right ventricular  size is normal.  3. The mitral valve is normal in structure. Mild mitral valve  regurgitation. No evidence of mitral stenosis.  4. The aortic valve is normal in structure. Aortic valve regurgitation is  not visualized. No aortic stenosis is present.  5. Aortic dilatation noted. There is borderline dilatation of the aortic  root, measuring 37 mm.  6. The inferior vena cava is  normal in size with greater than 50%  respiratory variability, suggesting right atrial pressure of 3 mmHg.  Past Medical History:  Diagnosis Date  . Acute respiratory failure (HCC) 08/06/2020  . Chronic cough    SINCE 08-06-2020 COVID PNEUMONIA  . COVID 08/06/2020  . Dvt femoral (deep venous thrombosis) (HCC) 08/2020   LEFT LEG   . History of blood transfusion 08/2020   AFTER MI 5 UNITS GIVEN  .  History of kidney stones   . Hypertension   . Numbness    LEFT LEG AT TIMES  . Pneumonia 08/06/2020   COVID PNEUMONIA    Past Surgical History:  Procedure Laterality Date  . CYSTOSCOPY WITH STENT PLACEMENT Left 10/06/2020   Procedure: CYSTOSCOPY WITH STENT PLACEMENT;  Surgeon: Rene Paci, MD;  Location: [facility];  Service: Urology;  Laterality: Left;  . IR IVC FILTER PLMT / S&I /IMG GUID/MOD SED  08/24/2020    MEDICATIONS: . acetaminophen (TYLENOL) 325 MG tablet  . metoprolol tartrate (LOPRESSOR) 25 MG tablet  . ondansetron (ZOFRAN) 4 MG tablet  . oxyCODONE-acetaminophen (PERCOCET) 5-325 MG tablet  . OXYGEN   No current facility-administered medications for this encounter.    Jodell Cipro, PA-C [facility] Pre-Surgical Testing [phone]

## 2020-11-10 ENCOUNTER — Other Ambulatory Visit (HOSPITAL_COMMUNITY)
Admission: RE | Admit: 2020-11-10 | Discharge: 2020-11-10 | Disposition: A | Discharge status: Home / Self Care | Payer: BC Managed Care – PPO | Source: Ambulatory Visit | Attending: Urology | Admitting: Urology

## 2020-11-10 DIAGNOSIS — Z20822 Contact with and (suspected) exposure to covid-19: Secondary | ICD-10-CM | POA: Insufficient documentation

## 2020-11-10 DIAGNOSIS — Z01812 Encounter for preprocedural laboratory examination: Secondary | ICD-10-CM | POA: Diagnosis not present

## 2020-11-10 LAB — SARS CORONAVIRUS 2 (TAT 6-24 HRS): SARS Coronavirus 2: NEGATIVE

## 2020-11-14 ENCOUNTER — Other Ambulatory Visit: Payer: Self-pay

## 2020-11-14 ENCOUNTER — Ambulatory Visit (HOSPITAL_COMMUNITY): Payer: BC Managed Care – PPO | Admitting: Physician Assistant

## 2020-11-14 ENCOUNTER — Ambulatory Visit (HOSPITAL_COMMUNITY)
Admission: RE | Admit: 2020-11-14 | Discharge: 2020-11-14 | Disposition: A | Discharge status: Home / Self Care | Payer: BC Managed Care – PPO | Attending: Urology | Admitting: Urology

## 2020-11-14 ENCOUNTER — Encounter (HOSPITAL_COMMUNITY): Payer: Self-pay | Admitting: Urology

## 2020-11-14 ENCOUNTER — Ambulatory Visit (HOSPITAL_COMMUNITY): Payer: BC Managed Care – PPO

## 2020-11-14 ENCOUNTER — Encounter (HOSPITAL_COMMUNITY)
Admission: RE | Disposition: A | Discharge status: Home / Self Care | Payer: Self-pay | Source: Home / Self Care | Attending: Urology

## 2020-11-14 DIAGNOSIS — E119 Type 2 diabetes mellitus without complications: Secondary | ICD-10-CM | POA: Diagnosis not present

## 2020-11-14 DIAGNOSIS — Z87442 Personal history of urinary calculi: Secondary | ICD-10-CM | POA: Diagnosis not present

## 2020-11-14 DIAGNOSIS — N201 Calculus of ureter: Secondary | ICD-10-CM | POA: Diagnosis not present

## 2020-11-14 DIAGNOSIS — N179 Acute kidney failure, unspecified: Secondary | ICD-10-CM | POA: Diagnosis not present

## 2020-11-14 DIAGNOSIS — U099 Post covid-19 condition, unspecified: Secondary | ICD-10-CM | POA: Insufficient documentation

## 2020-11-14 DIAGNOSIS — Z86718 Personal history of other venous thrombosis and embolism: Secondary | ICD-10-CM | POA: Diagnosis not present

## 2020-11-14 DIAGNOSIS — R053 Chronic cough: Secondary | ICD-10-CM | POA: Diagnosis not present

## 2020-11-14 DIAGNOSIS — I10 Essential (primary) hypertension: Secondary | ICD-10-CM | POA: Diagnosis not present

## 2020-11-14 HISTORY — DX: Chronic cough: R05.3

## 2020-11-14 HISTORY — PX: CYSTOSCOPY/URETEROSCOPY/HOLMIUM LASER/STENT PLACEMENT: SHX6546

## 2020-11-14 HISTORY — DX: Anesthesia of skin: R20.0

## 2020-11-14 HISTORY — DX: Personal history of urinary calculi: Z87.442

## 2020-11-14 HISTORY — DX: Essential (primary) hypertension: I10

## 2020-11-14 SURGERY — CYSTOSCOPY/URETEROSCOPY/HOLMIUM LASER/STENT PLACEMENT
Anesthesia: General | Laterality: Left

## 2020-11-14 MED ORDER — ONDANSETRON HCL 4 MG/2ML IJ SOLN
INTRAMUSCULAR | Status: DC | PRN
  Administered 2020-11-14: 4 mg via INTRAVENOUS

## 2020-11-14 MED ORDER — PROPOFOL 10 MG/ML IV BOLUS
INTRAVENOUS | Status: DC | PRN
  Administered 2020-11-14: 180 mg via INTRAVENOUS

## 2020-11-14 MED ORDER — HYDROCODONE-ACETAMINOPHEN 5-325 MG PO TABS: 1.0000 | ORAL_TABLET | ORAL | 0 refills | Status: DC | PRN

## 2020-11-14 MED ORDER — FENTANYL CITRATE (PF) 100 MCG/2ML IJ SOLN
INTRAMUSCULAR | Status: DC | PRN
  Administered 2020-11-14: 25 ug via INTRAVENOUS
  Administered 2020-11-14: 50 ug via INTRAVENOUS
  Administered 2020-11-14 (×2): 25 ug via INTRAVENOUS

## 2020-11-14 MED ORDER — PROPOFOL 10 MG/ML IV BOLUS
INTRAVENOUS | Status: AC
  Filled 2020-11-14: qty 40

## 2020-11-14 MED ORDER — OXYCODONE HCL 5 MG PO TABS: 5.0000 mg | ORAL_TABLET | Freq: Once | ORAL | Status: DC | PRN

## 2020-11-14 MED ORDER — ACETAMINOPHEN 160 MG/5ML PO SOLN: 325.0000 mg | ORAL | Status: DC | PRN

## 2020-11-14 MED ORDER — ONDANSETRON HCL 4 MG/2ML IJ SOLN: 4.0000 mg | Freq: Once | INTRAMUSCULAR | Status: DC | PRN

## 2020-11-14 MED ORDER — SODIUM CHLORIDE 0.9 % IR SOLN
Status: DC | PRN
  Administered 2020-11-14: 6000 mL

## 2020-11-14 MED ORDER — EPHEDRINE 5 MG/ML INJ
INTRAVENOUS | Status: AC
  Filled 2020-11-14: qty 10

## 2020-11-14 MED ORDER — MEPERIDINE HCL 50 MG/ML IJ SOLN: 6.2500 mg | INTRAMUSCULAR | Status: DC | PRN

## 2020-11-14 MED ORDER — ORAL CARE MOUTH RINSE: 15.0000 mL | Freq: Once | OROMUCOSAL | Status: AC

## 2020-11-14 MED ORDER — MIDAZOLAM HCL 2 MG/2ML IJ SOLN
INTRAMUSCULAR | Status: DC | PRN
  Administered 2020-11-14 (×2): 1 mg via INTRAVENOUS

## 2020-11-14 MED ORDER — ACETAMINOPHEN 325 MG PO TABS: 325.0000 mg | ORAL_TABLET | ORAL | Status: DC | PRN

## 2020-11-14 MED ORDER — 0.9 % SODIUM CHLORIDE (POUR BTL) OPTIME
TOPICAL | Status: DC | PRN
  Administered 2020-11-14: 1000 mL

## 2020-11-14 MED ORDER — FENTANYL CITRATE (PF) 100 MCG/2ML IJ SOLN: 25.0000 ug | INTRAMUSCULAR | Status: DC | PRN

## 2020-11-14 MED ORDER — ONDANSETRON HCL 4 MG PO TABS: 4.0000 mg | ORAL_TABLET | Freq: Every day | ORAL | 1 refills | Status: DC | PRN

## 2020-11-14 MED ORDER — DEXAMETHASONE SODIUM PHOSPHATE 10 MG/ML IJ SOLN
INTRAMUSCULAR | Status: DC | PRN
  Administered 2020-11-14: 4 mg via INTRAVENOUS

## 2020-11-14 MED ORDER — DEXAMETHASONE SODIUM PHOSPHATE 10 MG/ML IJ SOLN
INTRAMUSCULAR | Status: AC
  Filled 2020-11-14: qty 1

## 2020-11-14 MED ORDER — OXYCODONE HCL 5 MG/5ML PO SOLN: 5.0000 mg | Freq: Once | ORAL | Status: DC | PRN

## 2020-11-14 MED ORDER — FENTANYL CITRATE (PF) 100 MCG/2ML IJ SOLN
INTRAMUSCULAR | Status: AC
  Filled 2020-11-14: qty 2

## 2020-11-14 MED ORDER — MIDAZOLAM HCL 2 MG/2ML IJ SOLN
INTRAMUSCULAR | Status: AC
  Filled 2020-11-14: qty 2

## 2020-11-14 MED ORDER — LACTATED RINGERS IV SOLN: INTRAVENOUS | Status: DC

## 2020-11-14 MED ORDER — CHLORHEXIDINE GLUCONATE 0.12 % MT SOLN
15.0000 mL | Freq: Once | OROMUCOSAL | Status: AC
  Administered 2020-11-14: 15 mL via OROMUCOSAL

## 2020-11-14 MED ORDER — EPHEDRINE SULFATE-NACL 50-0.9 MG/10ML-% IV SOSY
PREFILLED_SYRINGE | INTRAVENOUS | Status: DC | PRN
  Administered 2020-11-14: 5 mg via INTRAVENOUS

## 2020-11-14 MED ORDER — ONDANSETRON HCL 4 MG/2ML IJ SOLN
INTRAMUSCULAR | Status: AC
  Filled 2020-11-14: qty 2

## 2020-11-14 MED ORDER — CIPROFLOXACIN IN D5W 400 MG/200ML IV SOLN
400.0000 mg | Freq: Once | INTRAVENOUS | Status: AC
  Administered 2020-11-14: 400 mg via INTRAVENOUS
  Filled 2020-11-14: qty 200

## 2020-11-14 MED ORDER — CEPHALEXIN 500 MG PO CAPS: 500.0000 mg | ORAL_CAPSULE | Freq: Two times a day (BID) | ORAL | 0 refills | Status: AC

## 2020-11-14 SURGICAL SUPPLY — 23 items
BASKET STONE 1.7 NGAGE (UROLOGICAL SUPPLIES) IMPLANT
BASKET ZERO TIP NITINOL 2.4FR (BASKET) ×3 IMPLANT
BENZOIN TINCTURE PRP APPL 2/3 (GAUZE/BANDAGES/DRESSINGS) ×3 IMPLANT
CATH URET 5FR 28IN OPEN ENDED (CATHETERS) ×3 IMPLANT
CLOSURE WOUND 1/2 X4 (GAUZE/BANDAGES/DRESSINGS) ×1
CLOTH BEACON ORANGE TIMEOUT ST (SAFETY) ×3 IMPLANT
FIBER LASER FLEXIVA 365 (UROLOGICAL SUPPLIES) IMPLANT
GLOVE BIO SURGEON STRL SZ7.5 (GLOVE) ×3 IMPLANT
GOWN STRL REUS W/TWL XL LVL3 (GOWN DISPOSABLE) ×3 IMPLANT
GUIDEWIRE STR DUAL SENSOR (WIRE) ×3 IMPLANT
GUIDEWIRE ZIPWRE .038 STRAIGHT (WIRE) ×3 IMPLANT
IV NS IRRIG 3000ML ARTHROMATIC (IV SOLUTION) ×6 IMPLANT
KIT TURNOVER CYSTO (KITS) ×3 IMPLANT
MANIFOLD NEPTUNE II (INSTRUMENTS) ×3 IMPLANT
PACK CYSTO (CUSTOM PROCEDURE TRAY) ×3 IMPLANT
STENT URET 6FRX24 CONTOUR (STENTS) ×3 IMPLANT
STRIP CLOSURE SKIN 1/2X4 (GAUZE/BANDAGES/DRESSINGS) ×2 IMPLANT
SYR 10ML LL (SYRINGE) ×3 IMPLANT
TRACTIP FLEXIVA PULS ID 200XHI (Laser) ×1 IMPLANT
TRACTIP FLEXIVA PULSE ID 200 (Laser) ×2
TUBE CONNECTING 12'X1/4 (SUCTIONS)
TUBE CONNECTING 12X1/4 (SUCTIONS) IMPLANT
TUBING UROLOGY SET (TUBING) ×3 IMPLANT

## 2020-11-14 NOTE — OR Nursing (Signed)
Old left side stent removed by MD Winter at 1237.

## 2020-11-14 NOTE — Transfer of Care (Signed)
Immediate Anesthesia Transfer of Care Note  Patient: Henry May  Procedure(s) Performed: CYSTOSCOPY/URETEROSCOPY/HOLMIUM LASER/STENT PLACEMENT (Left )  Patient Location: PACU  Anesthesia Type:General  Level of Consciousness: awake, drowsy and patient cooperative  Airway & Oxygen Therapy: Patient Spontanous Breathing and Patient connected to face mask oxygen  Post-op Assessment: Report given to RN and Post -op Vital signs reviewed and stable  Post vital signs: Reviewed and stable  Last Vitals:  Vitals Value Taken Time  BP    Temp    Pulse 64 11/14/20 1305  Resp 14 11/14/20 1305  SpO2 100 % 11/14/20 1305  Vitals shown include unvalidated device data.  Last Pain:  Vitals:   11/14/20 1005  TempSrc: Oral  PainSc: 0-No pain      Patients Stated Pain Goal: 2 (11/14/20 1005)  Complications: No complications documented.

## 2020-11-14 NOTE — Anesthesia Postprocedure Evaluation (Signed)
Anesthesia Post Note  Patient: Henry May  Procedure(s) Performed: CYSTOSCOPY/URETEROSCOPY/HOLMIUM LASER/STENT PLACEMENT (Left )     Patient location during evaluation: PACU Anesthesia Type: General Level of consciousness: awake and alert Pain management: pain level controlled Vital Signs Assessment: post-procedure vital signs reviewed and stable Respiratory status: spontaneous breathing, nonlabored ventilation, respiratory function stable and patient connected to nasal cannula oxygen Cardiovascular status: blood pressure returned to baseline and stable Postop Assessment: no apparent nausea or vomiting Anesthetic complications: no   No complications documented.  Last Vitals:  Vitals:   11/14/20 1357 11/14/20 1415  BP: 122/88 134/83  Pulse: 70 66  Resp: 18 16  Temp: 36.7 C 36.7 C  SpO2: 98% 100%    Last Pain:  Vitals:   11/14/20 1357  TempSrc:   PainSc: 0-No pain                 Deaun Rocha

## 2020-11-14 NOTE — Anesthesia Procedure Notes (Signed)
Procedure Name: LMA Insertion Date/Time: 11/14/2020 12:25 PM Performed by: Sindy Guadeloupe, CRNA Pre-anesthesia Checklist: Patient identified, Emergency Drugs available, Suction available, Patient being monitored and Timeout performed Patient Re-evaluated:Patient Re-evaluated prior to induction Oxygen Delivery Method: Circle system utilized Preoxygenation: Pre-oxygenation with 100% oxygen Induction Type: IV induction Ventilation: Mask ventilation without difficulty LMA: LMA inserted LMA Size: 4.0 Number of attempts: 1 Tube secured with: Tape Dental Injury: Teeth and Oropharynx as per pre-operative assessment

## 2020-11-14 NOTE — H&P (Signed)
Urology Preoperative H&P   Chief Complaint: Left ureteral stone  History of Present Illness: Henry May is a 54 y.o. male with a 6 mm right distal ureteral stone, s/p left JJ stent placement on 10/06/20.  He is here today for definitive treatment of his left sided stone burden.  He reports mild left flank pain and urinary urgency, but denies dyuria, hematuria, nausea/vomiting or fever/chills.   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 Hospital course complicated by progressive respiratory failure along with hypotension/PEA (secondary to hemorrhagic shock) requiring intubation and CPR with ROSC 08/22/2020.    Past Medical History:  Diagnosis Date  . Acute respiratory failure (HCC) 08/06/2020  . Chronic cough    SINCE 08-06-2020 COVID PNEUMONIA  . COVID 08/06/2020  . Dvt femoral (deep venous thrombosis) (HCC) 08/2020   LEFT LEG   . History of blood transfusion 08/2020   AFTER MI 5 UNITS GIVEN  . History of kidney stones   . Hypertension   . Numbness    LEFT LEG AT TIMES  . Pneumonia 08/06/2020   COVID PNEUMONIA    Past Surgical History:  Procedure Laterality Date  . CYSTOSCOPY WITH STENT PLACEMENT Left 10/06/2020   Procedure: CYSTOSCOPY WITH STENT PLACEMENT;  Surgeon: Henry Paci, MD;  Location: [facility];  Service: Urology;  Laterality: Left;  . IR IVC FILTER PLMT / S&I /IMG GUID/MOD SED  08/24/2020    Allergies: No Known Allergies  Family History  Problem Relation Age of Onset  . Diabetes Mellitus II Neg Hx     Social History:  reports that he has never smoked. He has never used smokeless tobacco. He reports that he does not drink alcohol and does not use drugs.  ROS: A complete review of systems was performed.  All systems are negative except for pertinent findings as noted.  Physical Exam:  Vital signs in last 24 hours: Temp:   [97.6 F (36.4 C)] 97.6 F (36.4 C) (11/30 1005) Pulse Rate:  [70] 70 (11/30 1005) Resp:  [22] 22 (11/30 1005) BP: (132)/(90) 132/90 (11/30 1005) SpO2:  [99 %] 99 % (11/30 1005) Weight:  [87.5 kg] 87.5 kg (11/30 1005) Constitutional:  Alert and oriented, No acute distress Cardiovascular: Regular rate and rhythm, No JVD Respiratory: Normal respiratory effort, Lungs clear bilaterally GI: Abdomen is soft, nontender, nondistended, no abdominal masses GU: No CVA tenderness Lymphatic: No lymphadenopathy Neurologic: Grossly intact, no focal deficits Psychiatric: Normal mood and affect  Laboratory Data:  No results for input(s): WBC, HGB, HCT, PLT in the last 72 hours.  No results for input(s): NA, K, CL, GLUCOSE, BUN, CALCIUM, CREATININE in the last 72 hours.  Invalid input(s): CO3   No results found for this or any previous visit (from the past 24 hour(s)). Recent Results (from the past 240 hour(s))  SARS CORONAVIRUS 2 (TAT 6-24 HRS) Nasopharyngeal Nasopharyngeal Swab     Status: None   Collection Time: 11/10/20  9:09 AM   Specimen: Nasopharyngeal Swab  Result Value Ref Range Status   SARS Coronavirus 2 NEGATIVE NEGATIVE Final    Comment: (NOTE) SARS-CoV-2 target nucleic acids are NOT DETECTED.  The SARS-CoV-2 RNA is generally detectable in upper and lower respiratory specimens during the acute phase of infection. Negative results do not preclude SARS-CoV-2 infection, do not rule out co-infections with other pathogens, and should not be used as the sole basis for treatment  or other patient management decisions. Negative results must be combined with clinical observations, patient history, and epidemiological information. The expected result is Negative.  Fact Sheet for Patients: HairSlick.no  Fact Sheet for Healthcare Providers: quierodirigir.com  This test is not yet approved or cleared by the [place] FDA and   has been authorized for detection and/or diagnosis of SARS-CoV-2 by FDA under an Emergency Use Authorization (EUA). This EUA will remain  in effect (meaning this test can be used) for the duration of the COVID-19 declaration under Se ction 564(b)(1) of the Act, 21 U.S.C. section 360bbb-3(b)(1), unless the authorization is terminated or revoked sooner.  Performed at [facility], [address] [place], [place] [address]     Renal Function: No results for input(s): CREATININE in the last 168 hours. Estimated Creatinine Clearance: 97.3 mL/min (by C-G formula based on SCr of 0.93 mg/dL).  Radiologic Imaging: No results found.  I independently reviewed the above imaging studies.  Assessment and Plan Henry May is a 54 y.o. male with a 6 mm left distal ureteral calculus  The risks, benefits and alternatives of cystoscopy with LEFT ureteroscopy, laser lithotripsy and ureteral stent placement was discussed the patient.  Risks included, but are not limited to: bleeding, urinary tract infection, ureteral injury/avulsion, ureteral stricture formation, retained stone fragments, the possibility that multiple surgeries may be required to treat the stone(s), MI, stroke, PE and the inherent risks of general anesthesia.  The patient voices understanding and wishes to proceed.      Henry Moody, MD 11/14/2020, 10:07 AM  [facility] Pager: [phone]

## 2020-11-14 NOTE — Op Note (Signed)
Operative Note  Preoperative diagnosis:  1.  6 mm left distal ureteral calculus  Postoperative diagnosis: 1.  6 mm left distal ureteral calculus  Procedure(s): 1.  Cystoscopy with left ureteroscopy, holmium laser lithotripsy and left ureteral stent exchange  Surgeon: Rhoderick Moody, MD  Assistants:  None  Anesthesia:  General  Complications:  None  EBL: Less than 5 mL  Specimens: 1.  Previously placed left JJ stent was removed intact, inspected and discarded 2.  Left ureteral stone fragments  Drains/Catheters: 1.  Left six French, 24 cm JJ stent with tether  Intraoperative findings:   1. 6 mm distal ureteral calculus  Indication:  Henry May is a 54 y.o. male with a 6 mm left distal ureteral calculus that required stent placement on 10/06/2020 out of concern for acute cystitis associated with the stone.  He is here today for definitive stone treatment.  He has been consented for the above procedures, voices understanding wishes to proceed.  Description of procedure:  After informed consent was obtained, the patient was brought to the operating room and general LMA anesthesia was administered. The patient was then placed in the dorsolithotomy position and prepped and draped in the usual sterile fashion. A timeout was performed. A 23 French rigid cystoscope was then inserted into the urethral meatus and advanced into the bladder under direct vision. A complete bladder survey revealed no intravesical pathology.  His previously placed left JJ stent was then grasped at its distal curl and retracted to the urethral meatus.  A Glidewire was then used to intubate the lumen of the stent and was advanced up to the left renal pelvis, or fluoroscopic guidance.  The previously placed stent was then removed over the wire intact, inspected and discarded.  A semirigid ureteroscope was then advanced into the bladder and alongside the left ureteral wire where his distal stone was  identified.  A 200 m holmium laser was then used to fracture the stone into several smaller pieces.  A zero tip basket was then used to atraumatically extract all stone fragments.  A new six [place], 24 cm JJ stent was then advanced over the wire and into good position within the left collecting system, confirming placement via fluoroscopy.  The tether the stent was left intact.  The patient's bladder was drained and all stone fragments were removed.  He tolerated the procedure well and was transferred to the postanesthesia in stable condition.  Plan: The patient has been instructed to remove his stents at 7 AM on 11/16/20

## 2020-11-14 NOTE — Anesthesia Preprocedure Evaluation (Addendum)
Anesthesia Evaluation  Patient identified by MRN, date of birth, ID band Patient awake    Reviewed: Allergy & Precautions, H&P , NPO status , Patient's Chart, lab work & pertinent test results, reviewed documented beta blocker date and time   Airway Mallampati: II  TM Distance: >3 FB Neck ROM: full    Dental no notable dental hx. (+) Edentulous Upper, Edentulous Lower, Lower Dentures, Upper Dentures   Pulmonary neg pulmonary ROS,    Pulmonary exam normal breath sounds clear to auscultation       Cardiovascular Exercise Tolerance: Good hypertension, Pt. on medications and Pt. on home beta blockers  Rhythm:regular Rate:Normal  ECHO 9/21 FINDINGS  Left Ventricle: Left ventricular ejection fraction, by estimation, is 60  to 65%. The left ventricle has normal function. The left ventricle has no  regional wall motion abnormalities. Global longitudinal strain performed  but not reported based on  interpreter judgement due to suboptimal tracking. The left ventricular  internal cavity size was mildly dilated. There is no left ventricular  hypertrophy. Left ventricular diastolic parameters are consistent with  Grade II diastolic dysfunction  (pseudonormalization). Normal left ventricular filling pressure.    Neuro/Psych negative neurological ROS  negative psych ROS   GI/Hepatic negative GI ROS, Neg liver ROS,   Endo/Other  diabetes  Renal/GU Renal disease  negative genitourinary   Musculoskeletal   Abdominal   Peds  Hematology negative hematology ROS (+)   Anesthesia Other Findings   Reproductive/Obstetrics negative OB ROS                            Anesthesia Physical Anesthesia Plan  ASA: III  Anesthesia Plan: General   Post-op Pain Management:    Induction: Intravenous  PONV Risk Score and Plan: 2 and Dexamethasone and Treatment may vary due to age or medical condition  Airway  Management Planned: LMA  Additional Equipment: None  Intra-op Plan:   Post-operative Plan:   Informed Consent: I have reviewed the patients History and Physical, chart, labs and discussed the procedure including the risks, benefits and alternatives for the proposed anesthesia with the patient or authorized representative who has indicated his/her understanding and acceptance.     Dental Advisory Given  Plan Discussed with: CRNA and Anesthesiologist  Anesthesia Plan Comments: ( )        Anesthesia Quick Evaluation

## 2020-11-15 ENCOUNTER — Encounter (HOSPITAL_COMMUNITY): Payer: Self-pay | Admitting: Urology

## 2020-11-16 ENCOUNTER — Encounter: Payer: Self-pay | Admitting: Family Medicine

## 2020-12-01 ENCOUNTER — Other Ambulatory Visit: Payer: Self-pay

## 2020-12-01 ENCOUNTER — Ambulatory Visit (INDEPENDENT_AMBULATORY_CARE_PROVIDER_SITE_OTHER): Payer: BC Managed Care – PPO

## 2020-12-01 DIAGNOSIS — Z23 Encounter for immunization: Secondary | ICD-10-CM

## 2020-12-01 NOTE — Progress Notes (Signed)
   Covid-19 Vaccination Clinic  Name:  Henry May    MRN: [id number] DOB: [birth year]  12/01/2020  [name] was observed post Covid-19 immunization for 15 minutes without incident. He was provided with Vaccine Information Sheet and instruction to access the V-Safe system.   Henry May was instructed to call 911 with any severe reactions post vaccine: Marland Kitchen Difficulty breathing  . Swelling of face and throat  . A fast heartbeat  . A bad rash all over body  . Dizziness and weakness   #2 Covid Vaccine administered LD without complication.

## 2020-12-06 ENCOUNTER — Other Ambulatory Visit: Payer: Self-pay | Admitting: Family Medicine

## 2020-12-11 DIAGNOSIS — Z23 Encounter for immunization: Secondary | ICD-10-CM | POA: Diagnosis not present

## 2020-12-12 DIAGNOSIS — N201 Calculus of ureter: Secondary | ICD-10-CM | POA: Diagnosis not present

## 2020-12-22 DIAGNOSIS — Z23 Encounter for immunization: Secondary | ICD-10-CM | POA: Diagnosis not present

## 2021-01-26 DIAGNOSIS — N200 Calculus of kidney: Secondary | ICD-10-CM | POA: Diagnosis not present

## 2021-02-16 ENCOUNTER — Ambulatory Visit: Payer: BC Managed Care – PPO | Admitting: Cardiovascular Disease

## 2021-02-22 ENCOUNTER — Encounter: Payer: Self-pay | Admitting: Family Medicine

## 2021-03-28 ENCOUNTER — Ambulatory Visit: Payer: BC Managed Care – PPO | Admitting: Cardiovascular Disease

## 2021-04-25 ENCOUNTER — Other Ambulatory Visit: Payer: Self-pay

## 2021-04-25 ENCOUNTER — Ambulatory Visit: Payer: BC Managed Care – PPO | Admitting: Cardiovascular Disease

## 2021-04-25 ENCOUNTER — Encounter: Payer: Self-pay | Admitting: Cardiovascular Disease

## 2021-04-25 VITALS — BP 130/92 | HR 81 | Ht 73.0 in | Wt 220.2 lb

## 2021-04-25 DIAGNOSIS — J9601 Acute respiratory failure with hypoxia: Secondary | ICD-10-CM

## 2021-04-25 DIAGNOSIS — I82403 Acute embolism and thrombosis of unspecified deep veins of lower extremity, bilateral: Secondary | ICD-10-CM

## 2021-04-25 DIAGNOSIS — U071 COVID-19: Secondary | ICD-10-CM

## 2021-04-25 NOTE — Patient Instructions (Signed)
Medication Instructions:  Your physician recommends that you continue on your current medications as directed. Please refer to the Current Medication list given to you today.  *If you need a refill on your cardiac medications before your next appointment, please call your pharmacy*   Lab Work: none If you have labs (blood work) drawn today and your tests are completely normal, you will receive your results only by: . MyChart Message (if you have MyChart) OR . A paper copy in the mail If you have any lab test that is abnormal or we need to change your treatment, we will call you to review the results.   Testing/Procedures: none   Follow-Up: At [facility], you and your health needs are our priority.  As part of our continuing mission to provide you with exceptional heart care, we have created designated Provider Care Teams.  These Care Teams include your primary Cardiologist (physician) and Advanced Practice Providers (APPs -  Physician Assistants and Nurse Practitioners) who all work together to provide you with the care you need, when you need it.   Your next appointment:   6 month(s)  The format for your next appointment:   In Person  Provider:   You will see one of the following Advanced Practice Providers on your designated Care Team:    Scott Weaver, PA-C  Vin Bhagat, PA-C    

## 2021-04-25 NOTE — Progress Notes (Signed)
Cardiology Office Note:    Date:  04/25/2021   ID:  Henry May, DOB 04-04-1966, MRN [id number]  PCP:  Jackelyn Poling, DO  [facility] Cardiologist:  New to [facility]   [facility] Electrophysiologist:  None   Referring MD: Jackelyn Poling, DO   Chief Complaint  Patient presents with  . Hypertension     Nov. 5, 2021:    Seen with wife, Henry May is a 55 y.o. male with a hx of covid pneumonia , complicated by pneumonia, respiratory arrest and mechanical ventilation. He had a PEA arrest due to a large hemorrhagic retroperitoneal bleed. .   He has had  a ureteral stent placed Oct. 22, 2021 without complications  and needs to have his renal stones removed  and we are asked to see him today by Dr. Devoria Glassing for pre op assessment.    covid diagnosed on Aug. 16.  Along with wife  Hospitalized in Aug. 22, 2021 With Covid .  Had respiratory failure,  Required high flow O2 and BIPAP  Was hospitalized for a month .   Had left DVT, was started on heparin  He developed a PEA arrest ,  Lasted 4 minutes,  Received 1 dose epi, intubated , high dose pressors.  Was found to have a large  retroperitoneal bleed that caused a PEA arrest  Heparin was stopped.  Has an IVC filter placed   He is not on  DOAC   He had an echocardiogram from 08/26/2020 which revealed normal left ventricular systolic function. He has grade 2 diastolic dysfunction. No significant valvular abnormalities. He has mild mitral regurgitation.  Works as a [employer] ,  Has not returned to work , still too fatigued.  Apr 25, 2021: Henry May is seen for follow up of his COVID hospitalization complicated by hypercoagubility, DVT, retroperitoneal bleed He has grade 2 diastolic dysfunction   Lots of fatigue ,    .  No CP .  No dyspnea  His fatigue has gradually been improving since his hospitalization  Seems to be due to covid  Goes to the gym tiwce a week.   s    He had a prolong hospitalization from  COVID pneumonia.  He developed hypercoagulability and developed bilateral DVT while on heparin.  He had a large retroperitoneal hematoma.  Dr. Tonia Brooms was caring for him at the time and requested an IVC filter placed on Sept. 9, 2021  We were asked to see him because he had a PEA arrest following development of a retroperitoneal hemorrhage.    I have talked to Dr. Tonia Brooms and we will refer Henry May to pulmonary medicine for follow up   We will not need to see him in cardiology clinic for this  PEA arrest.     Past Medical History:  Diagnosis Date  . Acute respiratory failure (HCC) 08/06/2020  . Chronic cough    SINCE 08-06-2020 COVID PNEUMONIA  . COVID 08/06/2020  . Dvt femoral (deep venous thrombosis) (HCC) 08/2020   LEFT LEG   . History of blood transfusion 08/2020   AFTER MI 5 UNITS GIVEN  . History of kidney stones   . Hypertension   . Numbness    LEFT LEG AT TIMES  . Pneumonia 08/06/2020   COVID PNEUMONIA    Past Surgical History:  Procedure Laterality Date  . CYSTOSCOPY WITH STENT PLACEMENT Left 10/06/2020   Procedure: CYSTOSCOPY WITH STENT PLACEMENT;  Surgeon: Rene Paci, MD;  Location: [facility];  Service: Urology;  Laterality: Left;  . CYSTOSCOPY/URETEROSCOPY/HOLMIUM LASER/STENT PLACEMENT Left 11/14/2020   Procedure: CYSTOSCOPY/URETEROSCOPY/HOLMIUM LASER/STENT PLACEMENT;  Surgeon: Rene Paci, MD;  Location: [facility];  Service: Urology;  Laterality: Left;  ONLY NEEDS 45 MIN  . IR IVC FILTER PLMT / S&I /IMG GUID/MOD SED  08/24/2020    Current Medications: No outpatient medications have been marked as taking for the 04/25/21 encounter (Office Visit) with Amberle Lyter, Deloris Ping, MD.     Allergies:   Patient has no known allergies.   Social History   Socioeconomic History  . Marital status: Married    Spouse name: Not on file  . Number of children: Not on file  . Years of education: Not on file  . Highest education level: Not on file  Occupational  History  . Not on file  Tobacco Use  . Smoking status: Never Smoker  . Smokeless tobacco: Never Used  Vaping Use  . Vaping Use: Never used  Substance and Sexual Activity  . Alcohol use: Never  . Drug use: Never  . Sexual activity: Yes  Other Topics Concern  . Not on file  Social History Narrative  . Not on file   Social Determinants of Health   Financial Resource Strain: Not on file  Food Insecurity: Not on file  Transportation Needs: Not on file  Physical Activity: Not on file  Stress: Not on file  Social Connections: Not on file     Family History: The patient's family history is negative for Diabetes Mellitus II.  ROS:   Please see the history of present illness.     All other systems reviewed and are negative.  EKGs/Labs/Other Studies Reviewed:    The following studies were reviewed today:   EKG:      Recent Labs: 08/19/2020: B Natriuretic Peptide 419.4 08/29/2020: ALT 692; Magnesium 1.8 11/06/2020: BUN 14; Creatinine, Ser 0.93; Hemoglobin 13.8; Platelets 251; Potassium 4.9; Sodium 138  Recent Lipid Panel    Component Value Date/Time   CHOL 219 (H) 10/31/2020 1053   TRIG 352 (H) 10/31/2020 1053   HDL 29 (L) 10/31/2020 1053   CHOLHDL 7.6 (H) 10/31/2020 1053   LDLCALC 127 (H) 10/31/2020 1053     Risk Assessment/Calculations:       Physical Exam:    Physical Exam: Blood pressure (!) 130/92, pulse 81, height 6\' 1"  (1.854 m), weight 220 lb 3.2 oz (99.9 kg), SpO2 96 %.  GEN:  Well nourished, well developed in no acute distress HEENT: Normal NECK: No JVD; No carotid bruits LYMPHATICS: No lymphadenopathy CARDIAC: RRR , no murmurs, rubs, gallops RESPIRATORY:  Clear to auscultation without rales, wheezing or rhonchi  ABDOMEN: Soft, non-tender, non-distended MUSCULOSKELETAL:  No edema; No deformity  SKIN: Warm and dry NEUROLOGIC:  Alert and oriented x 3   ASSESSMENT:    1. Acute hypoxemic respiratory failure due to COVID-19 [facility])    PLAN:       1. Cardiovascular risk prior to uroscopy: The patient had a prolonged and complicated hospitalization.  It started with a Covid infection with severe respiratory failure.  He had hypercoagulable state and developed a DVT in his left leg.  He was treated with IV heparin but subsequently developed a large retroperitoneal hematoma.  This retroperitoneal bleed ultimately led to a PEA arrest with CPR.   The anticoagulation was stopped and an IVC filter was placed.  He was seen by pulmonary during that hospitalization but has not been seen by cardiology.  He will follow-up  with pulmonary.  He may see cardiology as needed.  2.  History of DVT: He had a hypercoagulable state presumably related to COVID pneumonia.  I do not know if he has any other underlying hypercoagulability issues.  I talked with Dr. Tonia Brooms.  He will see him in clinic and they can make a decision about removing the IVC filter.  He is now off his anticoagulation.  3.  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  We will have him see Dr. Tonia Brooms.  He he may need to be seen in the pulmonary long COVID clinic.   Shared Decision Making/Informed Consent      Medication Adjustments/Labs and Tests Ordered: Current medicines are reviewed at length with the patient today.  Concerns regarding medicines are outlined above.  Orders Placed This Encounter  Procedures  . Ambulatory referral to Pulmonology   No orders of the defined types were placed in this encounter.   Patient Instructions  Medication Instructions:  Your physician recommends that you continue on your current medications as directed. Please refer to the Current Medication list given to you today.  *If you need a refill on your cardiac medications before your next appointment, please call your pharmacy*   Lab Work: none If you have labs (blood work) drawn today and your tests are completely normal, you will receive your  results only by: Marland Kitchen MyChart Message (if you have MyChart) OR . A paper copy in the mail If you have any lab test that is abnormal or we need to change your treatment, we will call you to review the results.   Testing/Procedures: none   Follow-Up: At [facility], you and your health needs are our priority.  As part of our continuing mission to provide you with exceptional heart care, we have created designated Provider Care Teams.  These Care Teams include your primary Cardiologist (physician) and Advanced Practice Providers (APPs -  Physician Assistants and Nurse Practitioners) who all work together to provide you with the care you need, when you need it.   Your next appointment:   6 month(s)  The format for your next appointment:   In Person  Provider:   You will see one of the following Advanced Practice Providers on your designated Care Team:    Tereso Newcomer, PA-C  Chelsea Aus, [place]      Signed, Kristeen Miss, MD  04/25/2021 5:21 PM    [facility]

## 2021-05-31 ENCOUNTER — Institutional Professional Consult (permissible substitution): Payer: BC Managed Care – PPO | Admitting: Pulmonary Disease

## 2021-08-08 ENCOUNTER — Institutional Professional Consult (permissible substitution): Payer: BC Managed Care – PPO | Admitting: Pulmonary Disease

## 2021-08-17 ENCOUNTER — Institutional Professional Consult (permissible substitution): Payer: BC Managed Care – PPO | Admitting: Pulmonary Disease

## 2021-09-05 ENCOUNTER — Ambulatory Visit: Payer: BC Managed Care – PPO | Admitting: Pulmonary Disease

## 2021-09-05 ENCOUNTER — Other Ambulatory Visit: Payer: Self-pay

## 2021-09-05 ENCOUNTER — Encounter: Payer: Self-pay | Admitting: Pulmonary Disease

## 2021-09-05 VITALS — BP 122/78 | HR 66 | Temp 97.8°F | Ht 73.0 in | Wt 220.0 lb

## 2021-09-05 DIAGNOSIS — Z8616 Personal history of COVID-19: Secondary | ICD-10-CM

## 2021-09-05 DIAGNOSIS — J849 Interstitial pulmonary disease, unspecified: Secondary | ICD-10-CM | POA: Diagnosis not present

## 2021-09-05 DIAGNOSIS — U071 COVID-19: Secondary | ICD-10-CM | POA: Diagnosis not present

## 2021-09-05 DIAGNOSIS — J80 Acute respiratory distress syndrome: Secondary | ICD-10-CM

## 2021-09-05 DIAGNOSIS — Z86718 Personal history of other venous thrombosis and embolism: Secondary | ICD-10-CM | POA: Diagnosis not present

## 2021-09-05 NOTE — Progress Notes (Signed)
Synopsis: Referred in September 2022 for history of COVID-19, PCP: By Vesta Mixer, MD  Subjective:   PATIENT ID: Henry May GENDER: male DOB: 1966/08/04, MRN: [id number]  Chief Complaint  Patient presents with   Hospitalization Follow-up    HFU after having COVID last year.     This is a 55 year old gentleman, history of severe COVID-19 pneumonia in August 2021 with a prolonged hospitalization complicated by retroperitoneal hematoma, cardiac arrest, bilateral lower extremity DVT, IVC filter placement.  Prolonged ICU stay.  From a respiratory standpoint patient is doing well.  He has questions today about removal of his IVC filter.  He does feel fatigued at times with exertion.   Past Medical History:  Diagnosis Date   Acute respiratory failure (HCC) 08/06/2020   Chronic cough    SINCE 08-06-2020 COVID PNEUMONIA   COVID 08/06/2020   Dvt femoral (deep venous thrombosis) (HCC) 08/2020   LEFT LEG    History of blood transfusion 08/2020   AFTER MI 5 UNITS GIVEN   History of kidney stones    Hypertension    Numbness    LEFT LEG AT TIMES   Pneumonia 08/06/2020   COVID PNEUMONIA     Family History  Problem Relation Age of Onset   Diabetes Mellitus II Neg Hx      Past Surgical History:  Procedure Laterality Date   CYSTOSCOPY WITH STENT PLACEMENT Left 10/06/2020   Procedure: CYSTOSCOPY WITH STENT PLACEMENT;  Surgeon: Rene Paci, MD;  Location: [facility];  Service: Urology;  Laterality: Left;   CYSTOSCOPY/URETEROSCOPY/HOLMIUM LASER/STENT PLACEMENT Left 11/14/2020   Procedure: CYSTOSCOPY/URETEROSCOPY/HOLMIUM LASER/STENT PLACEMENT;  Surgeon: Rene Paci, MD;  Location: [facility];  Service: Urology;  Laterality: Left;  ONLY NEEDS 45 MIN   IR IVC FILTER PLMT / S&I /IMG GUID/MOD SED  08/24/2020    Social History   Socioeconomic History   Marital status: Married    Spouse name: Not on file   Number of children: Not on file   Years of education: Not  on file   Highest education level: Not on file  Occupational History   Not on file  Tobacco Use   Smoking status: Never   Smokeless tobacco: Never  Vaping Use   Vaping Use: Never used  Substance and Sexual Activity   Alcohol use: Never   Drug use: Never   Sexual activity: Yes  Other Topics Concern   Not on file  Social History Narrative   Not on file   Social Determinants of Health   Financial Resource Strain: Not on file  Food Insecurity: Not on file  Transportation Needs: Not on file  Physical Activity: Not on file  Stress: Not on file  Social Connections: Not on file  Intimate Partner Violence: Not on file     No Known Allergies   No outpatient medications prior to visit.   No facility-administered medications prior to visit.    Review of Systems  Constitutional:  Positive for malaise/fatigue. Negative for chills, fever and weight loss.  HENT:  Negative for hearing loss, sore throat and tinnitus.   Eyes:  Negative for blurred vision and double vision.  Respiratory:  Negative for cough, hemoptysis, sputum production, shortness of breath, wheezing and stridor.   Cardiovascular:  Negative for chest pain, palpitations, orthopnea, leg swelling and PND.  Gastrointestinal:  Negative for abdominal pain, constipation, diarrhea, heartburn, nausea and vomiting.  Genitourinary:  Negative for dysuria, hematuria and urgency.  Musculoskeletal:  Negative for joint pain  and myalgias.  Skin:  Negative for itching and rash.  Neurological:  Negative for dizziness, tingling, weakness and headaches.  Endo/Heme/Allergies:  Negative for environmental allergies. Does not bruise/bleed easily.  Psychiatric/Behavioral:  Negative for depression. The patient is not nervous/anxious and does not have insomnia.   All other systems reviewed and are negative.   Objective:  Physical Exam Vitals reviewed.  Constitutional:      General: He is not in acute distress.    Appearance: He is  well-developed.  HENT:     Head: Normocephalic and atraumatic.  Eyes:     General: No scleral icterus.    Conjunctiva/sclera: Conjunctivae normal.     Pupils: Pupils are equal, round, and reactive to light.  Neck:     Vascular: No JVD.     Trachea: No tracheal deviation.  Cardiovascular:     Rate and Rhythm: Normal rate and regular rhythm.     Heart sounds: Normal heart sounds. No murmur heard. Pulmonary:     Effort: Pulmonary effort is normal. No tachypnea, accessory muscle usage or respiratory distress.     Breath sounds: Normal breath sounds. No stridor. No wheezing, rhonchi or rales.  Abdominal:     General: Bowel sounds are normal. There is no distension.     Palpations: Abdomen is soft.     Tenderness: There is no abdominal tenderness.  Musculoskeletal:        General: No tenderness.     Cervical back: Neck supple.  Lymphadenopathy:     Cervical: No cervical adenopathy.  Skin:    General: Skin is warm and dry.     Capillary Refill: Capillary refill takes less than 2 seconds.     Findings: No rash.  Neurological:     Mental Status: He is alert and oriented to person, place, and time.  Psychiatric:        Behavior: Behavior normal.     Vitals:   09/05/21 1520  BP: 122/78  Pulse: 66  Temp: 97.8 F (36.6 C)  TempSrc: Oral  SpO2: 97%  Weight: 220 lb (99.8 kg)  Height: 6\' 1"  (1.854 m)   97% on RA BMI Readings from Last 3 Encounters:  09/05/21 29.03 kg/m  04/25/21 29.05 kg/m  11/14/20 26.18 kg/m   Wt Readings from Last 3 Encounters:  09/05/21 220 lb (99.8 kg)  04/25/21 220 lb 3.2 oz (99.9 kg)  11/14/20 193 lb (87.5 kg)    CBC    Component Value Date/Time   WBC 8.0 11/06/2020 0914   RBC 4.67 11/06/2020 0914   HGB 13.8 11/06/2020 0914   HCT 42.8 11/06/2020 0914   PLT 251 11/06/2020 0914   MCV 91.6 11/06/2020 0914   MCH 29.6 11/06/2020 0914   MCHC 32.2 11/06/2020 0914   RDW 14.6 11/06/2020 0914   LYMPHSABS 1.4 08/23/2020 0011   MONOABS 0.9  08/23/2020 0011   EOSABS 0.1 08/23/2020 0011   BASOSABS 0.0 08/23/2020 0011     Chest Imaging: 08/23/2020 CT chest: Bilateral parenchymal infiltrates consistent with COVID-19 pneumonia, pneumomediastinum, right apical pneumothorax and large left retroperitoneal hematoma. The patient's images have been independently reviewed by me.    Pulmonary Functions Testing Results: No flowsheet data found.  FeNO:   Pathology:   Echocardiogram:   Heart Catheterization:     Assessment & Plan:     ICD-10-CM   1. Interstitial pulmonary disease (HCC)  J84.9 CT CHEST HIGH RESOLUTION    Pulmonary Function Test    2. History of COVID-19  Z86.16 CT CHEST HIGH RESOLUTION    Pulmonary Function Test    3. History of DVT (deep vein thrombosis)  Z86.718 VAS [place] LOWER EXTREMITY VENOUS (DVT)    4. Acute respiratory distress syndrome (ARDS) due to COVID-19 virus (HCC)  U07.1    J80       Discussion:  This is a 55 year old gentleman, lucky to be alive after prolonged hospitalization secondary to severe COVID-19 pneumonia, hospitalization complicated by retroperitoneal hematoma ARDS, cardiac arrest.  He also had bilateral lower extremity DVT with an IVC filter placed.  Plan: We will obtain HRCT scan of the chest to see what his lung parenchyma looks like Have pulmonary function test complete prior to next office visit. We will get bilateral lower extremity duplex. Asked him to start an 81 mg baby aspirin daily If there is no evidence of clot in the lower extremity I think we can send him back to IR for consideration of removal of the IVC filter if possible. Return to clinic in a couple weeks after these tests are done.  No current outpatient medications on file.  I spent 63 minutes dedicated to the care of this patient on the date of this encounter to include pre-visit review of records, face-to-face time with the patient discussing conditions above, post visit ordering of testing, clinical  documentation with the electronic health record, making appropriate referrals as documented, and communicating necessary findings to members of the patients care team.   Josephine Igo, DO [facility] 09/05/2021 4:40 PM

## 2021-09-05 NOTE — Patient Instructions (Addendum)
Thank you for visiting Dr. Tonia Brooms at [facility]. Today we recommend the following:  Orders Placed This Encounter  Procedures   CT CHEST HIGH RESOLUTION   Pulmonary Function Test   VAS [place] LOWER EXTREMITY VENOUS (DVT)   Return in about 6 weeks (around 10/17/2021) for with APP or Dr. Tonia Brooms.    Please do your part to reduce the spread of COVID-19.

## 2021-09-10 ENCOUNTER — Encounter (HOSPITAL_COMMUNITY): Payer: BC Managed Care – PPO

## 2021-09-12 ENCOUNTER — Telehealth: Payer: Self-pay | Admitting: Pulmonary Disease

## 2021-09-12 ENCOUNTER — Ambulatory Visit (HOSPITAL_COMMUNITY)
Admission: RE | Admit: 2021-09-12 | Discharge: 2021-09-12 | Disposition: A | Discharge status: Home / Self Care | Payer: BC Managed Care – PPO | Source: Ambulatory Visit | Attending: Pulmonary Disease | Admitting: Pulmonary Disease

## 2021-09-12 ENCOUNTER — Other Ambulatory Visit: Payer: Self-pay

## 2021-09-12 DIAGNOSIS — Z86718 Personal history of other venous thrombosis and embolism: Secondary | ICD-10-CM | POA: Insufficient documentation

## 2021-09-12 NOTE — Telephone Encounter (Signed)
Please document on call report and if you notified provider

## 2021-09-12 NOTE — Progress Notes (Signed)
Bilateral lower extremity venous duplex has been completed. Preliminary results can be found in CV Proc through chart review.  Results were given to [facility] at Dr. Myrlene Broker office.  09/12/21 4:25 PM Olen Cordial RVT

## 2021-09-13 NOTE — Telephone Encounter (Signed)
I notified BI of the call report.  Nothing further is needed.

## 2021-09-17 ENCOUNTER — Ambulatory Visit (INDEPENDENT_AMBULATORY_CARE_PROVIDER_SITE_OTHER): Payer: BC Managed Care – PPO | Admitting: Pulmonary Disease

## 2021-09-17 ENCOUNTER — Other Ambulatory Visit: Payer: Self-pay

## 2021-09-17 DIAGNOSIS — J849 Interstitial pulmonary disease, unspecified: Secondary | ICD-10-CM

## 2021-09-17 DIAGNOSIS — Z8616 Personal history of COVID-19: Secondary | ICD-10-CM

## 2021-09-17 LAB — PULMONARY FUNCTION TEST
DL/VA % pred: 144 %
DL/VA: 6.13 ml/min/mmHg/L
DLCO cor % pred: 90 %
DLCO cor: 27.87 ml/min/mmHg
DLCO unc % pred: 90 %
DLCO unc: 27.87 ml/min/mmHg
FEF 25-75 Post: 4.48 L/sec
FEF 25-75 Pre: 4.71 L/sec
FEF2575-%Change-Post: -4 %
FEF2575-%Pred-Post: 131 %
FEF2575-%Pred-Pre: 138 %
FEV1-%Change-Post: 0 %
FEV1-%Pred-Post: 79 %
FEV1-%Pred-Pre: 78 %
FEV1-Post: 3.24 L
FEV1-Pre: 3.24 L
FEV1FVC-%Change-Post: 2 %
FEV1FVC-%Pred-Pre: 116 %
FEV6-%Change-Post: -2 %
FEV6-%Pred-Post: 68 %
FEV6-%Pred-Pre: 70 %
FEV6-Post: 3.54 L
FEV6-Pre: 3.63 L
FEV6FVC-%Pred-Post: 104 %
FEV6FVC-%Pred-Pre: 104 %
FVC-%Change-Post: -2 %
FVC-%Pred-Post: 65 %
FVC-%Pred-Pre: 67 %
FVC-Post: 3.54 L
FVC-Pre: 3.63 L
Post FEV1/FVC ratio: 92 %
Post FEV6/FVC ratio: 100 %
Pre FEV1/FVC ratio: 89 %
Pre FEV6/FVC Ratio: 100 %
RV % pred: 84 %
RV: 1.97 L
TLC % pred: 85 %
TLC: 6.49 L

## 2021-09-17 NOTE — Patient Instructions (Signed)
Full PFT performed today. °

## 2021-09-17 NOTE — Progress Notes (Signed)
Full PFT performed today. °

## 2021-09-20 ENCOUNTER — Other Ambulatory Visit: Payer: BC Managed Care – PPO

## 2021-10-03 ENCOUNTER — Other Ambulatory Visit: Payer: BC Managed Care – PPO

## 2022-05-21 ENCOUNTER — Encounter: Payer: Self-pay | Admitting: *Deleted

## 2022-05-29 ENCOUNTER — Telehealth: Payer: Self-pay | Admitting: Pulmonary Disease

## 2022-06-06 NOTE — Telephone Encounter (Addendum)
Marchelle Folks please schedule this CT.

## 2022-06-07 NOTE — Telephone Encounter (Signed)
CT has been scheduled for 7/3 at 10:00 am. Nothing further needed at this time.

## 2022-06-17 ENCOUNTER — Ambulatory Visit (HOSPITAL_COMMUNITY)
Admission: RE | Admit: 2022-06-17 | Discharge: 2022-06-17 | Disposition: A | Discharge status: Home / Self Care | Payer: 59 | Source: Ambulatory Visit | Attending: Pulmonary Disease | Admitting: Pulmonary Disease

## 2022-06-17 DIAGNOSIS — J849 Interstitial pulmonary disease, unspecified: Secondary | ICD-10-CM | POA: Insufficient documentation

## 2022-06-17 DIAGNOSIS — Z8616 Personal history of COVID-19: Secondary | ICD-10-CM | POA: Insufficient documentation

## 2022-07-04 ENCOUNTER — Other Ambulatory Visit: Payer: 59

## 2022-07-10 ENCOUNTER — Encounter: Payer: Self-pay | Admitting: Pulmonary Disease

## 2022-07-10 ENCOUNTER — Ambulatory Visit (INDEPENDENT_AMBULATORY_CARE_PROVIDER_SITE_OTHER): Payer: 59 | Admitting: Pulmonary Disease

## 2022-07-10 VITALS — BP 122/70 | HR 69 | Ht 73.0 in | Wt 240.6 lb

## 2022-07-10 DIAGNOSIS — J849 Interstitial pulmonary disease, unspecified: Secondary | ICD-10-CM

## 2022-07-10 DIAGNOSIS — Z8616 Personal history of COVID-19: Secondary | ICD-10-CM | POA: Diagnosis not present

## 2022-07-10 DIAGNOSIS — Z95828 Presence of other vascular implants and grafts: Secondary | ICD-10-CM

## 2022-07-10 DIAGNOSIS — Z86718 Personal history of other venous thrombosis and embolism: Secondary | ICD-10-CM | POA: Diagnosis not present

## 2022-07-10 NOTE — Progress Notes (Signed)
Synopsis: Referred in September 2022 for history of COVID-19, PCP: By Littie Deeds, MD  Subjective:   PATIENT ID: Henry May GENDER: male DOB: 1966-07-26, MRN: [id number]  Chief Complaint  Patient presents with   Follow-up    Pt f/u to discuss CT and Pft from last year    This is a 56 year old gentleman, history of severe COVID-19 pneumonia in August 2021 with a prolonged hospitalization complicated by retroperitoneal hematoma, cardiac arrest, bilateral lower extremity DVT, IVC filter placement.  Prolonged ICU stay.  From a respiratory standpoint patient is doing well.  He has questions today about removal of his IVC filter.  He does feel fatigued at times with exertion.  OV 07/10/2022: Here today for follow-up.  CT scan of the chest completed which still shows persistent infiltrates in the chest consistent with scarring related to his COVID-19.  His fatigue is a little bit better than last time but he has been trying to combat that.  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  He understands that he does need to lose some weight.  We talked about his CT scan findings as well as his hepatic steatosis that was commented on.    Past Medical History:  Diagnosis Date   Acute respiratory failure (HCC) 08/06/2020   Chronic cough    SINCE 08-06-2020 COVID PNEUMONIA   COVID 08/06/2020   Dvt femoral (deep venous thrombosis) (HCC) 08/2020   LEFT LEG    History of blood transfusion 08/2020   AFTER MI 5 UNITS GIVEN   History of kidney stones    Hypertension    Numbness    LEFT LEG AT TIMES   Pneumonia 08/06/2020   COVID PNEUMONIA     Family History  Problem Relation Age of Onset   Diabetes Mellitus II Neg Hx      Past Surgical History:  Procedure Laterality Date   CYSTOSCOPY WITH STENT PLACEMENT Left 10/06/2020   Procedure: CYSTOSCOPY WITH STENT PLACEMENT;  Surgeon: Rene Paci, MD;  Location: [facility];  Service:  Urology;  Laterality: Left;   CYSTOSCOPY/URETEROSCOPY/HOLMIUM LASER/STENT PLACEMENT Left 11/14/2020   Procedure: CYSTOSCOPY/URETEROSCOPY/HOLMIUM LASER/STENT PLACEMENT;  Surgeon: Rene Paci, MD;  Location: [facility];  Service: Urology;  Laterality: Left;  ONLY NEEDS 45 MIN   IR IVC FILTER PLMT / S&I /IMG GUID/MOD SED  08/24/2020    Social History   Socioeconomic History   Marital status: Married    Spouse name: Not on file   Number of children: Not on file   Years of education: Not on file   Highest education level: Not on file  Occupational History   Not on file  Tobacco Use   Smoking status: Never   Smokeless tobacco: Never  Vaping Use   Vaping Use: Never used  Substance and Sexual Activity   Alcohol use: Never   Drug use: Never   Sexual activity: Yes  Other Topics Concern   Not on file  Social History Narrative   Not on file   Social Determinants of Health   Financial Resource Strain: Not on file  Food Insecurity: Not on file  Transportation Needs: Not on file  Physical Activity: Not on file  Stress: Not on file  Social Connections: Not on file  Intimate Partner Violence: Not on file     No Known Allergies   No outpatient medications prior to visit.   No facility-administered medications prior to  visit.    Review of Systems  Constitutional:  Positive for malaise/fatigue. Negative for chills, fever and weight loss.  HENT:  Negative for hearing loss, sore throat and tinnitus.   Eyes:  Negative for blurred vision and double vision.  Respiratory:  Negative for cough, hemoptysis, sputum production, shortness of breath, wheezing and stridor.   Cardiovascular:  Negative for chest pain, palpitations, orthopnea, leg swelling and PND.  Gastrointestinal:  Negative for abdominal pain, constipation, diarrhea, heartburn, nausea and vomiting.  Genitourinary:  Negative for dysuria, hematuria and urgency.  Musculoskeletal:  Negative for joint pain and myalgias.   Skin:  Negative for itching and rash.  Neurological:  Negative for dizziness, tingling, weakness and headaches.  Endo/Heme/Allergies:  Negative for environmental allergies. Does not bruise/bleed easily.  Psychiatric/Behavioral:  Negative for depression. The patient is not nervous/anxious and does not have insomnia.   All other systems reviewed and are negative.    Objective:  Physical Exam Vitals reviewed.  Constitutional:      General: He is not in acute distress.    Appearance: He is well-developed. He is obese.  HENT:     Head: Normocephalic and atraumatic.  Eyes:     General: No scleral icterus.    Conjunctiva/sclera: Conjunctivae normal.     Pupils: Pupils are equal, round, and reactive to light.  Neck:     Vascular: No JVD.     Trachea: No tracheal deviation.  Cardiovascular:     Rate and Rhythm: Normal rate and regular rhythm.     Heart sounds: Normal heart sounds. No murmur heard. Pulmonary:     Effort: Pulmonary effort is normal. No tachypnea, accessory muscle usage or respiratory distress.     Breath sounds: No stridor. No wheezing, rhonchi or rales.     Comments: No crackles Abdominal:     General: There is no distension.     Palpations: Abdomen is soft.     Tenderness: There is no abdominal tenderness.  Musculoskeletal:        General: No tenderness.     Cervical back: Neck supple.  Lymphadenopathy:     Cervical: No cervical adenopathy.  Skin:    General: Skin is warm and dry.     Capillary Refill: Capillary refill takes less than 2 seconds.     Findings: No rash.  Neurological:     Mental Status: He is alert and oriented to person, place, and time.  Psychiatric:        Behavior: Behavior normal.      Vitals:   07/10/22 1008  BP: 122/70  Pulse: 69  SpO2: 95%  Weight: 240 lb 9.6 oz (109.1 kg)  Height: 6\' 1"  (1.854 m)   95% on RA BMI Readings from Last 3 Encounters:  07/10/22 31.74 kg/m  09/05/21 29.03 kg/m  04/25/21 29.05 kg/m   Wt  Readings from Last 3 Encounters:  07/10/22 240 lb 9.6 oz (109.1 kg)  09/05/21 220 lb (99.8 kg)  04/25/21 220 lb 3.2 oz (99.9 kg)    CBC    Component Value Date/Time   WBC 8.0 11/06/2020 0914   RBC 4.67 11/06/2020 0914   HGB 13.8 11/06/2020 0914   HCT 42.8 11/06/2020 0914   PLT 251 11/06/2020 0914   MCV 91.6 11/06/2020 0914   MCH 29.6 11/06/2020 0914   MCHC 32.2 11/06/2020 0914   RDW 14.6 11/06/2020 0914   LYMPHSABS 1.4 08/23/2020 0011   MONOABS 0.9 08/23/2020 0011   EOSABS 0.1 08/23/2020 0011  BASOSABS 0.0 08/23/2020 0011     Chest Imaging: 08/23/2020 CT chest: Bilateral parenchymal infiltrates consistent with COVID-19 pneumonia, pneumomediastinum, right apical pneumothorax and large left retroperitoneal hematoma. The patient's images have been independently reviewed by me.    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 The patient's images have been independently reviewed by me.    Pulmonary Functions Testing Results:    Latest Ref Rng & Units 09/17/2021    3:57 PM  PFT Results  FVC-Pre L 3.63   FVC-Predicted Pre % 67   FVC-Post L 3.54   FVC-Predicted Post % 65   Pre FEV1/FVC % % 89   Post FEV1/FCV % % 92   FEV1-Pre L 3.24   FEV1-Predicted Pre % 78   FEV1-Post L 3.24   DLCO uncorrected ml/min/mmHg 27.87   DLCO UNC% % 90   DLCO corrected ml/min/mmHg 27.87   DLCO COR %Predicted % 90   DLVA Predicted % 144   TLC L 6.49   TLC % Predicted % 85   RV % Predicted % 84    FeNO:   Pathology:   Echocardiogram:   Heart Catheterization:     Assessment & Plan:     ICD-10-CM   1. History of DVT (deep vein thrombosis)  Z86.718 Ambulatory referral to Interventional Radiology    2. S/P IVC filter  [id number] Ambulatory referral to Interventional Radiology    3. Interstitial pulmonary disease (HCC)  J84.9     4. History of COVID-19  Z86.16        Discussion:  This is a 56 year old gentleman, prolonged  hospitalization secondary to severe COVID-19 pneumonia, hospitalization complicated by retroperitoneal hematoma, ARDS, cardiac arrest, bilateral lower extremity DVT status post IVC filter placement.  Plan: HRCT reviewed today with parenchymal changes consistent with scarring related to COVID-19. Continue 81 mg aspirin daily. Referral to interventional radiology consider IV C filter removal. Patient can follow-up with [place] as needed. I think he could consider repeat CT imaging of the chest in 3 to 5 years to follow-up on any changes related to COVID-19 in his chest.  We do not have any real clear guidelines on image follow-up related to fibrosis post ARDS. It be great to make sure he does not have any changes over time. I did explain that if he has worsening shortness of breath that we would consider repeat axial imaging sooner.  No current outpatient medications on file.   Josephine Igo, DO [facility] 07/10/2022 10:27 AM

## 2022-07-10 NOTE — Patient Instructions (Signed)
Thank you for visiting Dr. Tonia Brooms at [facility]. Today we recommend the following:  Orders Placed This Encounter  Procedures   Ambulatory referral to Interventional Radiology   Call us if your symptoms change   Return if symptoms worsen or fail to improve.    Please do your part to reduce the spread of COVID-19.

## 2022-07-23 ENCOUNTER — Telehealth: Payer: Self-pay | Admitting: *Deleted

## 2022-07-23 ENCOUNTER — Other Ambulatory Visit: Payer: Self-pay | Admitting: Pulmonary Disease

## 2022-07-23 DIAGNOSIS — Z95828 Presence of other vascular implants and grafts: Secondary | ICD-10-CM

## 2022-07-23 NOTE — Telephone Encounter (Signed)
S/w pt wife, she states they will call back to reschedule after they get a new insurance. Currently have [employer], we do not participate and the plan is expiring at the end of Aug./vm

## 2022-07-23 NOTE — Telephone Encounter (Signed)
Called Henry May to schedule follow to discuss IVC Filter Retrieval with Dr Grace Isaac. LMOM/vm

## 2022-07-29 ENCOUNTER — Other Ambulatory Visit: Payer: 59

## 2022-07-30 IMAGING — US US ABDOMEN COMPLETE
1 series · 13 of 25 positions shown · non-contrast
Comparison: None.

CLINICAL DATA: Elevated LFTs

EXAM:
ABDOMEN ULTRASOUND COMPLETE

[Series 1: us abdomen complete · 13 of 97 slices shown]
[im 1/97]
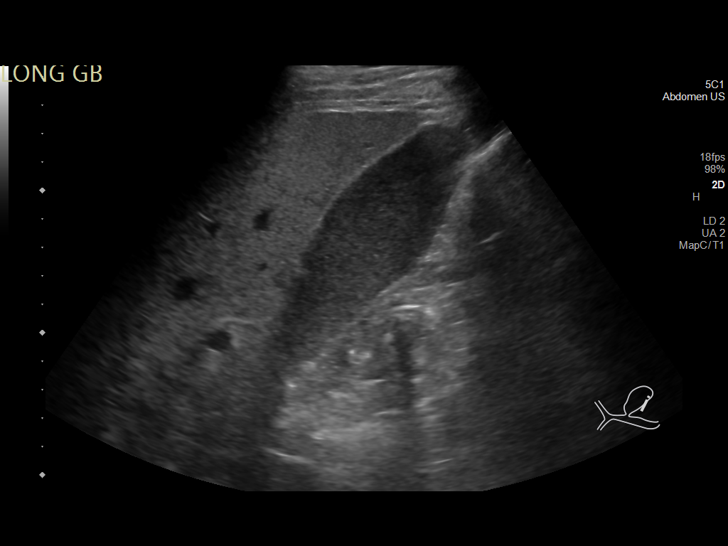
[im 9/97]
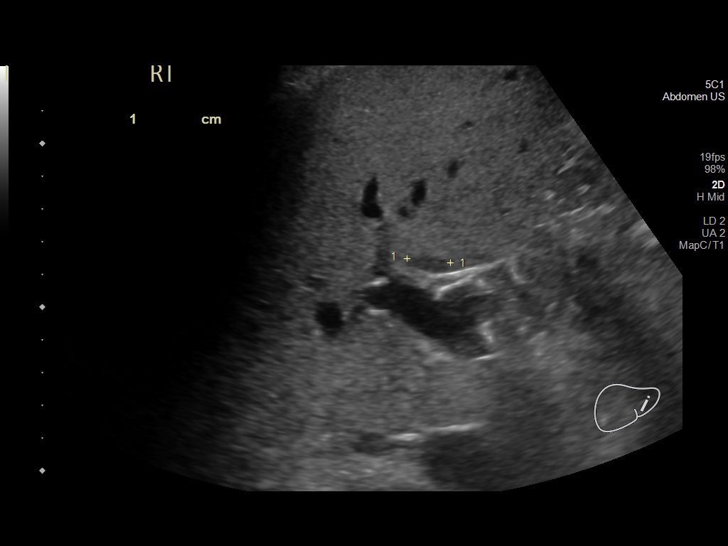
[im 17/97]
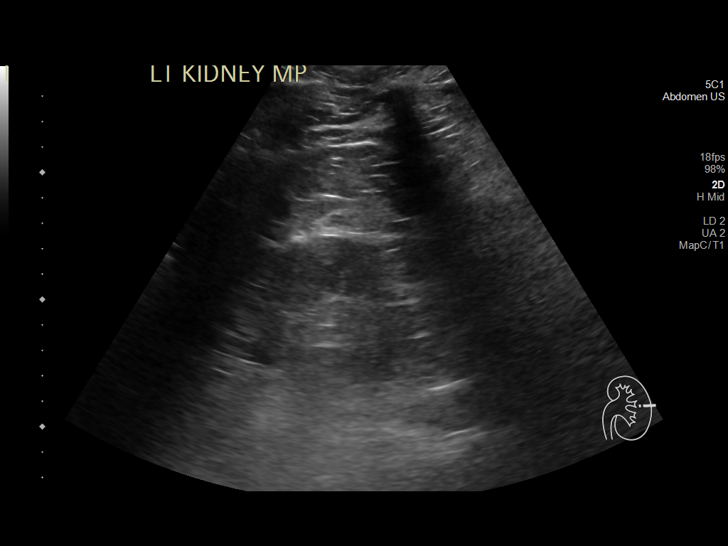
[im 25/97]
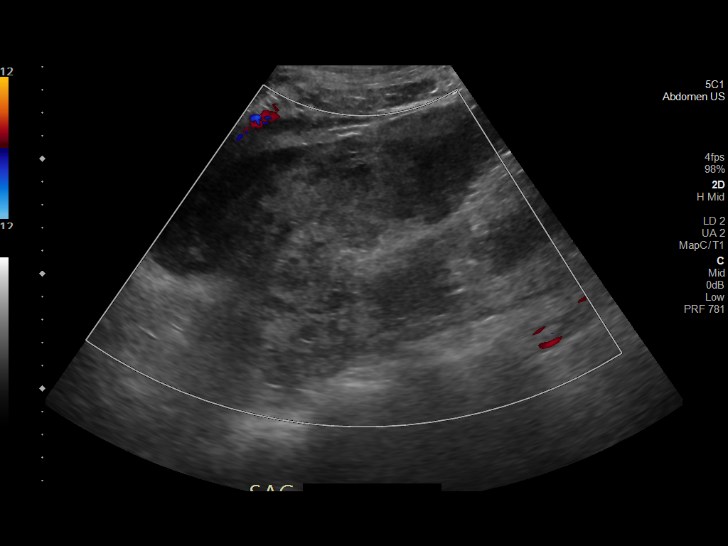
[im 33/97]
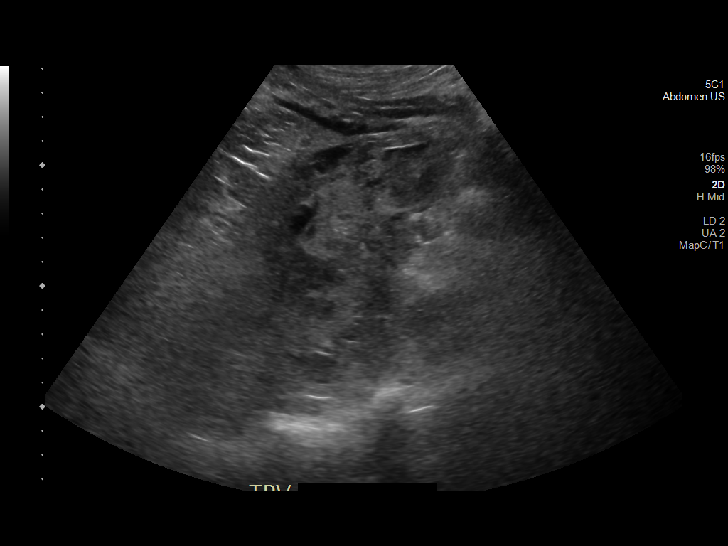
[im 41/97]
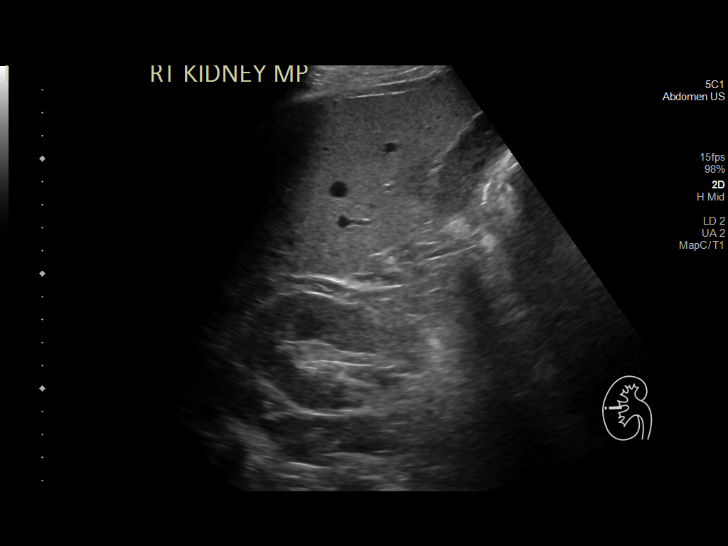
[im 49/97]
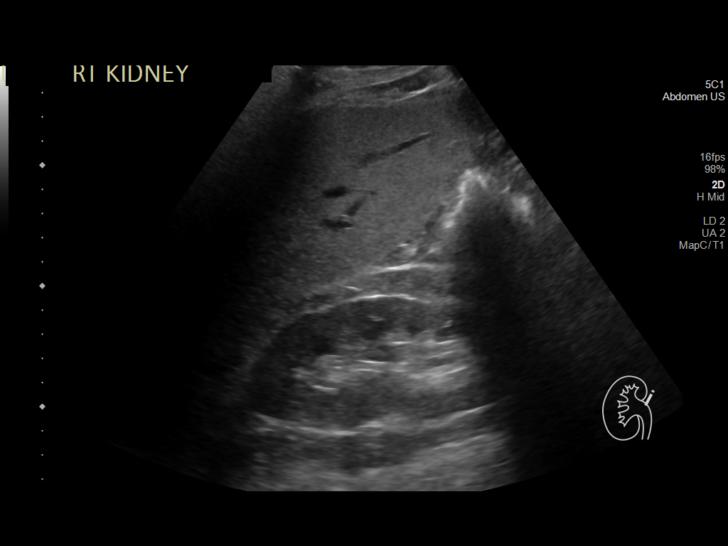
[im 57/97]
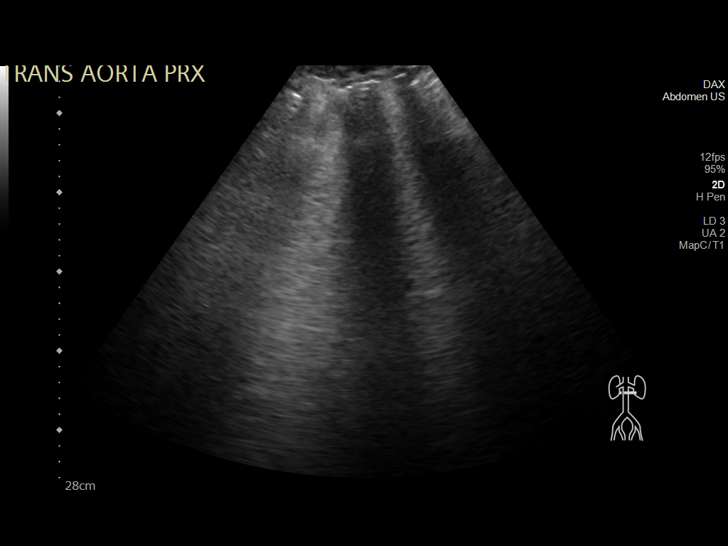
[im 65/97]
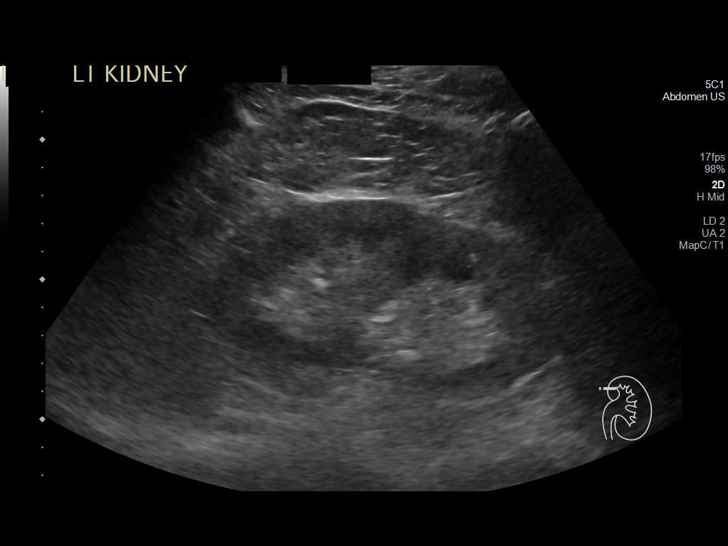
[im 73/97]
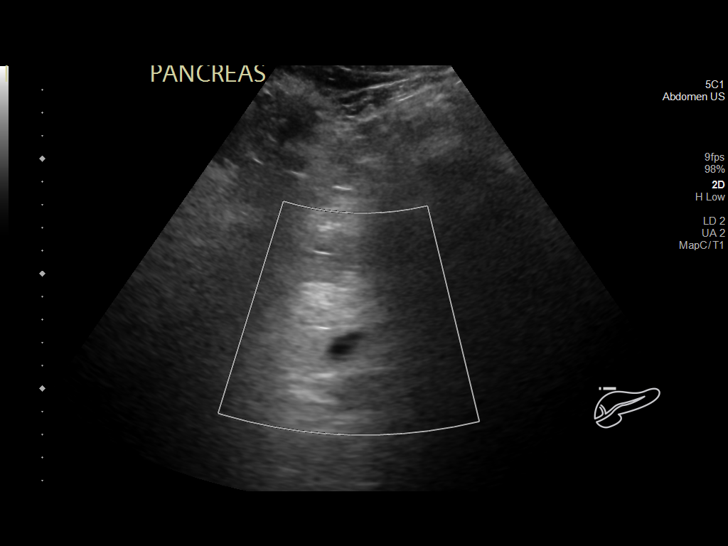
[im 81/97]
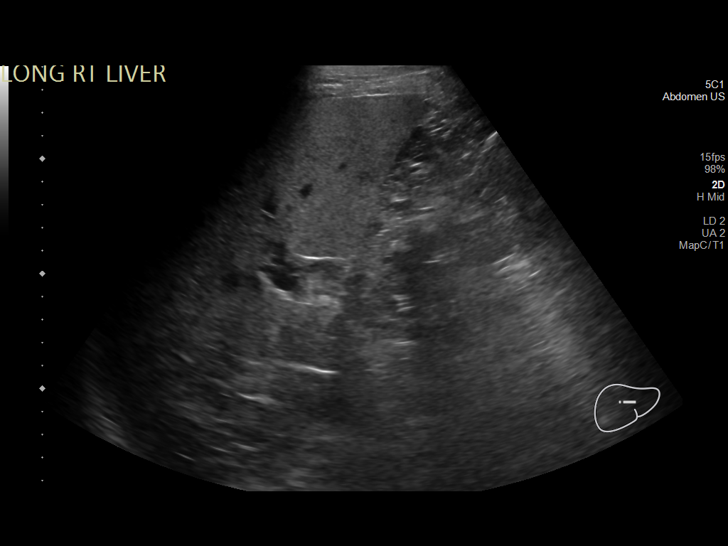
[im 89/97]
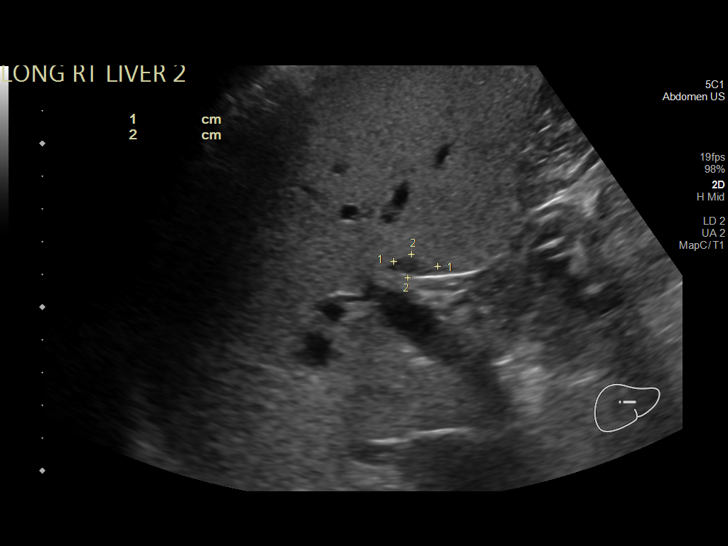
[im 97/97]
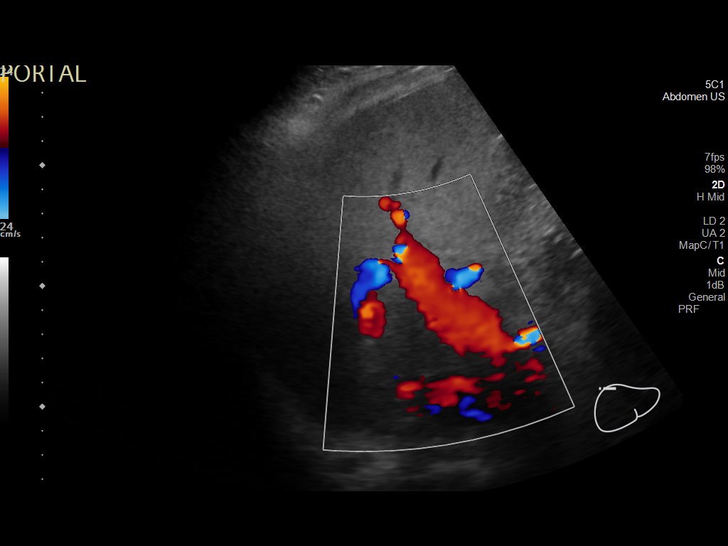

[13 of 25 positions shown; findings below may reference images not displayed]

FINDINGS: Gallbladder: No gallstones or wall thickening visualized. Due to the
patient's condition ultrasound Murphy's sign could not be elicited.
No cholelithiasis is noted. Gallbladder sludge is noted.

Common bile duct: Diameter: 2.4 mm.

Liver: Generalized increased echogenicity is noted throughout the
liver consistent with fatty infiltration. There are 2 areas of
relative decreased attenuation identified within the right lobe of
the liver. The largest of these measures 2.7 cm in greatest
dimension. These may represent areas of focal fatty sparing although
incompletely evaluated on this exam and not visualized on recent CT
due to lack of contrast. Portal vein is patent on color Doppler
imaging with normal direction of blood flow towards the liver.

IVC: No abnormality visualized.

Pancreas: Not well visualized due to overlying bowel gas.

Spleen: Size and appearance within normal limits.

Right Kidney: Length: 12.3 cm. Echogenicity within normal limits.
No mass or hydronephrosis visualized.

Left Kidney: Length: 13.1 cm. Echogenicity within normal limits. No
mass or hydronephrosis visualized.

Abdominal aorta: No aneurysm visualized.

Other findings: The known left retroperitoneal hematoma is again
identified in the left flank and left pericolic gutter region
IMPRESSION: Somewhat limited exam due the patient's current status.

Fatty infiltration of the liver with areas of decreased echogenicity
in the right lobe of the liver which may represent fatty
infiltration. The need for further evaluation can be determined when
the patient's clinical picture improves.

Changes consistent with the known left retroperitoneal hematoma.

## 2022-07-30 IMAGING — CT CT CHEST W/O CM
2 of 5 series · 13 of 46 positions shown, 15 images · non-contrast
Comparison: None.
COMPARISON: None.

Addendum:
CLINICAL DATA: Respiratory failure. Possible intra-abdominal
hemorrhage. Recent COVID positive.

EXAM:
CT CHEST, ABDOMEN AND PELVIS WITHOUT CONTRAST
TECHNIQUE: Multidetector CT imaging of the chest, abdomen and pelvis was
performed following the standard protocol without IV contrast.

[Series 4: cap w/o 2.0 mm st · axial · non-contrast · 0.98mm/px · z∈[+715,+1309]mm · 10 of 337 slices shown, 12 images]
[im 20/337  soft-tissue]
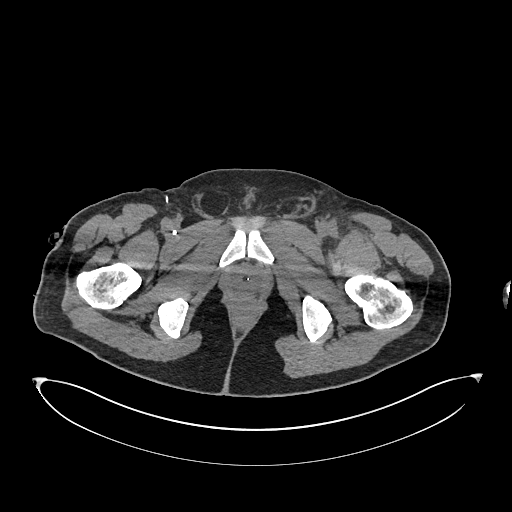
[im 20/337  bone]
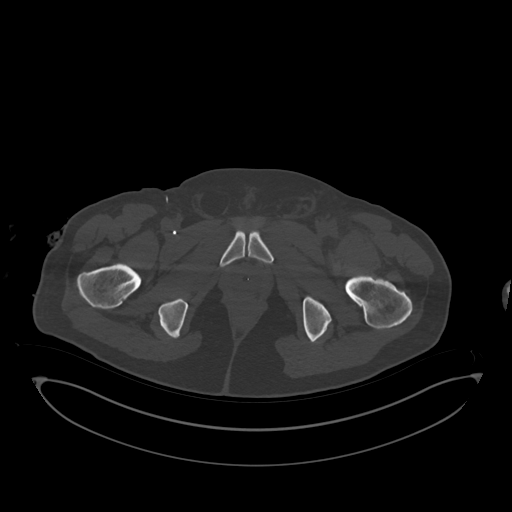
[im 60/337  soft-tissue]
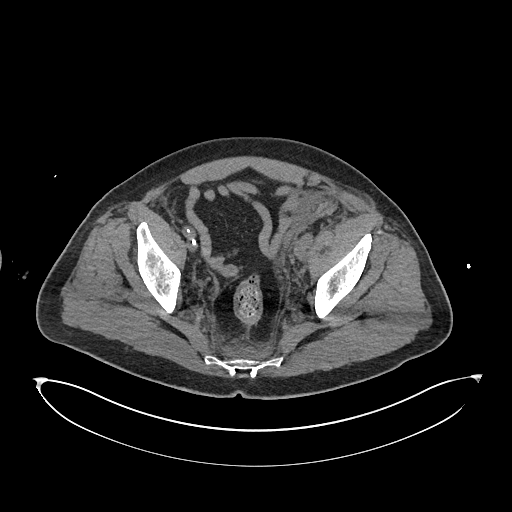
[im 99/337  soft-tissue]
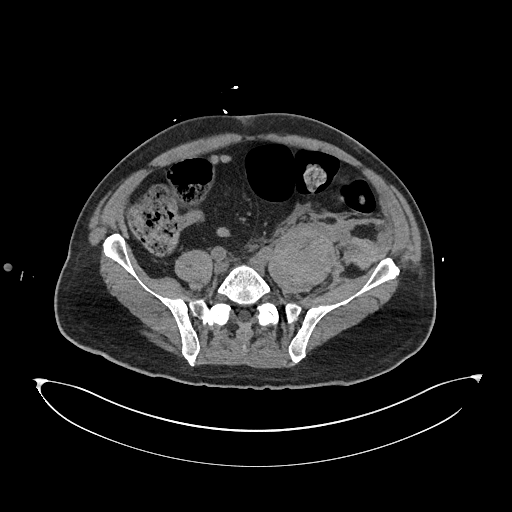
[im 119/337  soft-tissue]
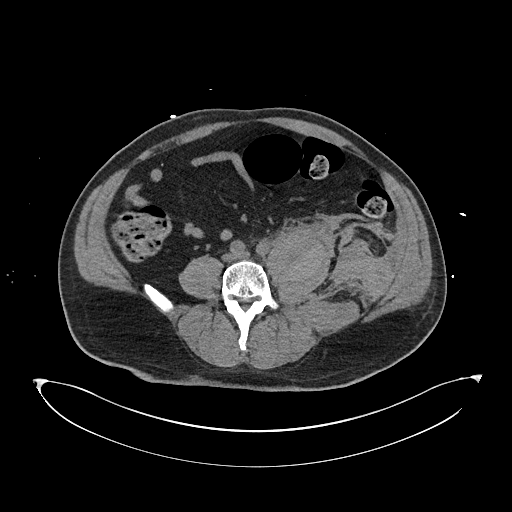
[im 159/337  soft-tissue]
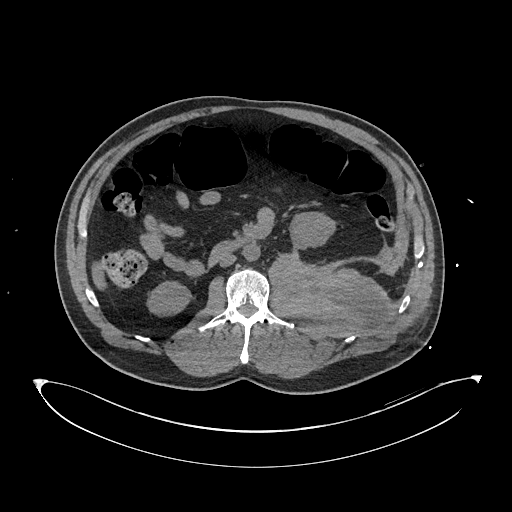
[im 178/337  soft-tissue]
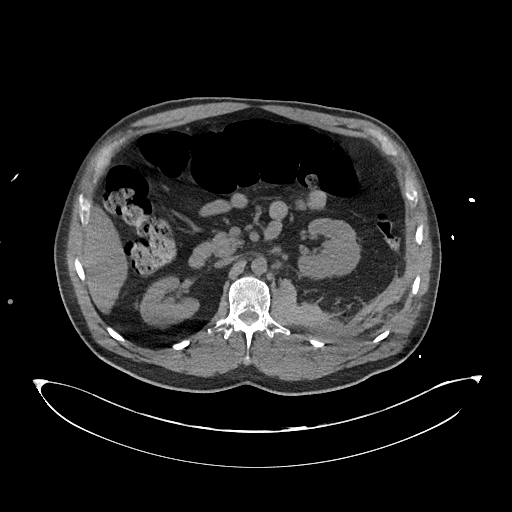
[im 218/337  soft-tissue]
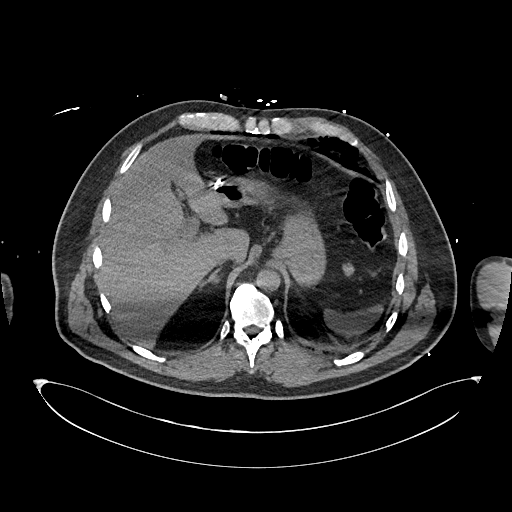
[im 257/337  soft-tissue]
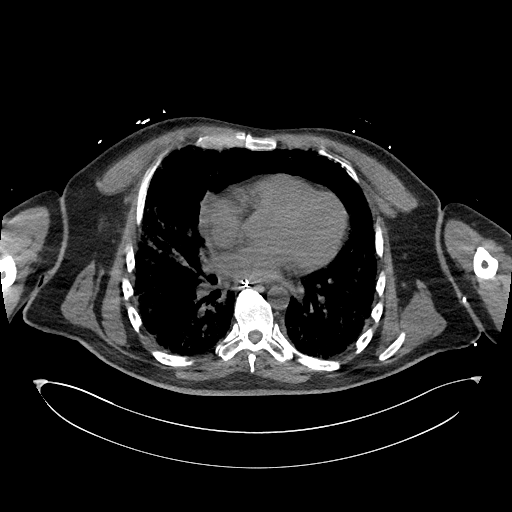
[im 277/337  soft-tissue]
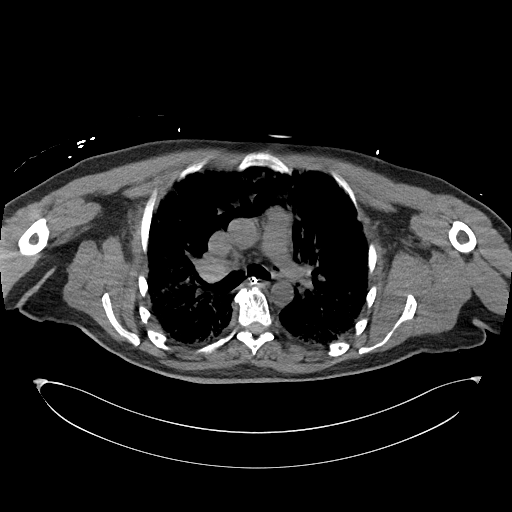
[im 277/337  bone]
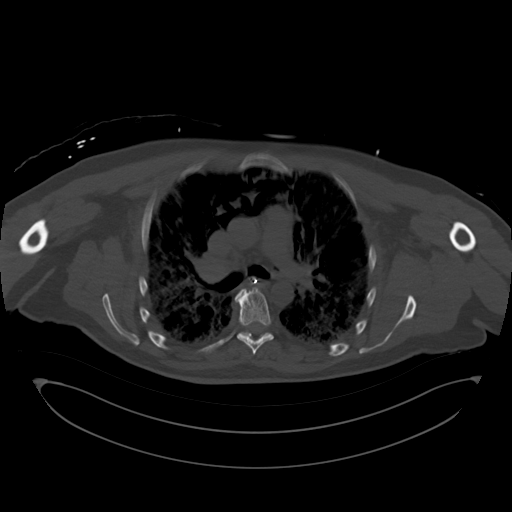
[im 317/337  soft-tissue]
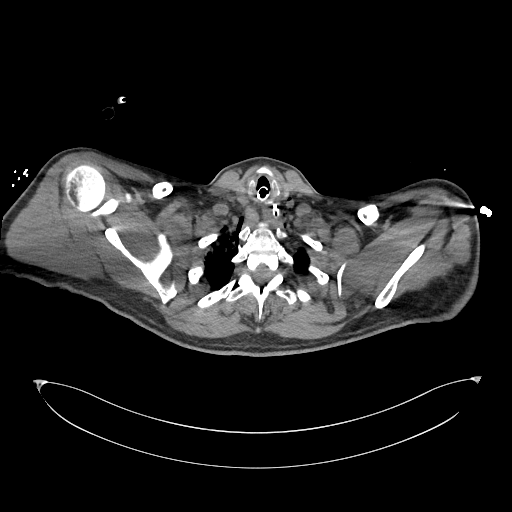

[Series 6: cap w/o 3.0 mm st cor · coronal · non-contrast · 0.77mm/px · 3 of 107 slices shown]
[im 36/107  soft-tissue]
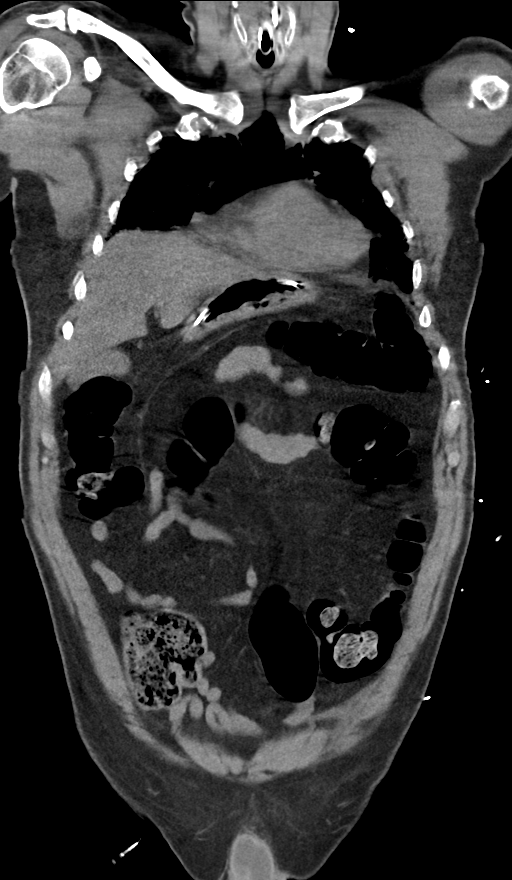
[im 48/107  soft-tissue]
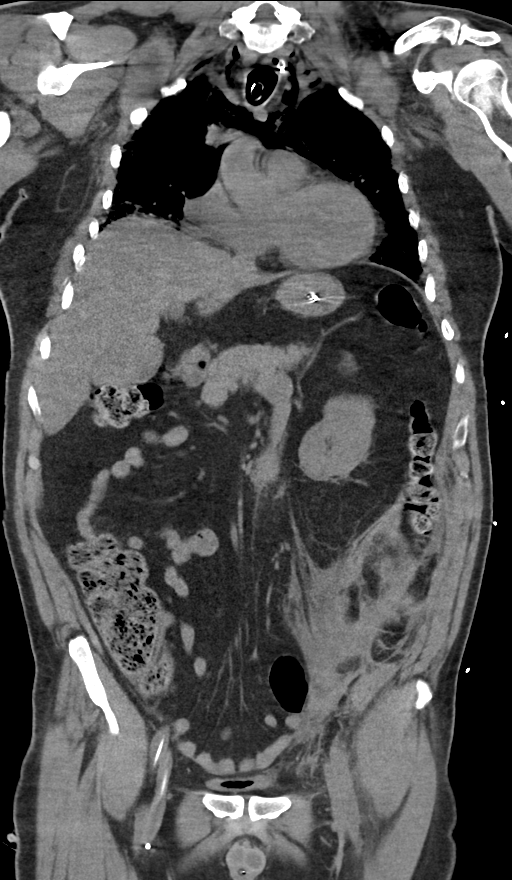
[im 59/107  soft-tissue]
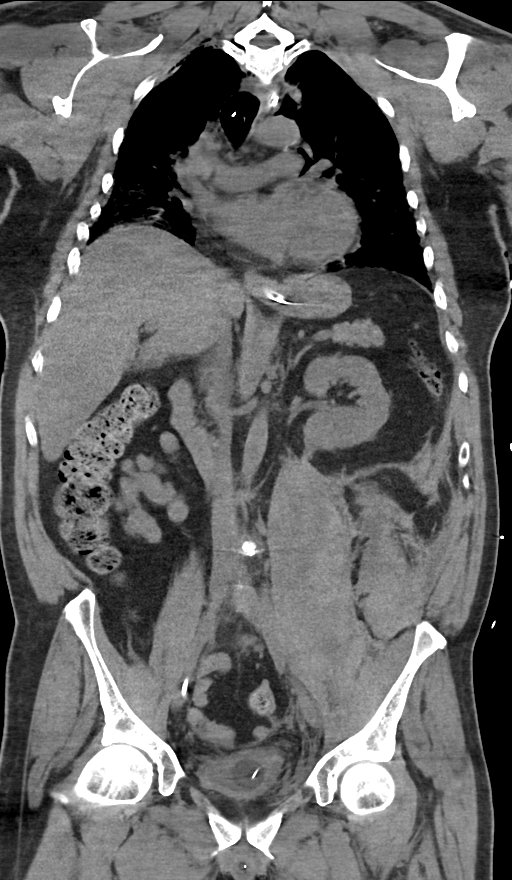

[13 of 46 positions shown; findings below may reference images not displayed]

FINDINGS: CT CHEST FINDINGS

Cardiovascular: Heart is normal size.  Aorta normal caliber.

Mediastinum/Nodes: Endotracheal tube tip is in the lower trachea.
There is pneumomediastinum throughout the mediastinum and extending
in to the lower neck. No adenopathy.

Lungs/Pleura: Small right apical pneumothorax. Extensive bilateral
ground-glass airspace disease. No effusions.

Musculoskeletal: Chest wall soft tissues are unremarkable. No acute
bony abnormality.

CT ABDOMEN PELVIS FINDINGS

Hepatobiliary: No focal hepatic abnormality. Gallbladder
unremarkable.

Pancreas: No focal abnormality or ductal dilatation.

Spleen: No focal abnormality.  Normal size.

Adrenals/Urinary Tract: Small bilateral nonobstructing renal stones.
No ureteral stones or hydronephrosis. Foley catheter is present in
the bladder which is decompressed. Adrenals unremarkable.

Stomach/Bowel: Stomach, large and small bowel grossly unremarkable.
NG tube is in the distal stomach.

Vascular/Lymphatic: No evidence of aneurysm or adenopathy.

Reproductive: No visible focal abnormality.

Other: No free fluid or free air. Large heterogeneous area noted
within the left retroperitoneum involving the left psoas and
iliopsoas muscles compatible with retroperitoneal hematoma. This
measures approximately 12.8 x 9.3 cm on axial imaging and extends
from posterior to the left kidney down near the left groin region.

Musculoskeletal: No acute bony abnormality.
IMPRESSION: Extensive ground-glass opacities throughout the lungs most
compatible with pneumonia, likely COVID pneumonia.

Pneumomediastinum, extending into the lower neck soft tissues.

Small right apical pneumothorax.

Large left retroperitoneal hematoma.

Bilateral small nonobstructing renal stones.  No hydronephrosis.

Attempts are being made to contact the ordering physician or
physician on-call.

ADDENDUM:
Critical Value/emergent results were called by telephone at the time
of interpretation on 08/23/2020 at [DATE] to provider STANFORD JUMPER
, who verbally acknowledged these results.

*** End of Addendum ***
FINDINGS: CT CHEST FINDINGS

Cardiovascular: Heart is normal size.  Aorta normal caliber.

Mediastinum/Nodes: Endotracheal tube tip is in the lower trachea.
There is pneumomediastinum throughout the mediastinum and extending
in to the lower neck. No adenopathy.

Lungs/Pleura: Small right apical pneumothorax. Extensive bilateral
ground-glass airspace disease. No effusions.

Musculoskeletal: Chest wall soft tissues are unremarkable. No acute
bony abnormality.

CT ABDOMEN PELVIS FINDINGS

Hepatobiliary: No focal hepatic abnormality. Gallbladder
unremarkable.

Pancreas: No focal abnormality or ductal dilatation.

Spleen: No focal abnormality.  Normal size.

Adrenals/Urinary Tract: Small bilateral nonobstructing renal stones.
No ureteral stones or hydronephrosis. Foley catheter is present in
the bladder which is decompressed. Adrenals unremarkable.

Stomach/Bowel: Stomach, large and small bowel grossly unremarkable.
NG tube is in the distal stomach.

Vascular/Lymphatic: No evidence of aneurysm or adenopathy.

Reproductive: No visible focal abnormality.

Other: No free fluid or free air. Large heterogeneous area noted
within the left retroperitoneum involving the left psoas and
iliopsoas muscles compatible with retroperitoneal hematoma. This
measures approximately 12.8 x 9.3 cm on axial imaging and extends
from posterior to the left kidney down near the left groin region.

Musculoskeletal: No acute bony abnormality.
IMPRESSION: Extensive ground-glass opacities throughout the lungs most
compatible with pneumonia, likely COVID pneumonia.

Pneumomediastinum, extending into the lower neck soft tissues.

Small right apical pneumothorax.

Large left retroperitoneal hematoma.

Bilateral small nonobstructing renal stones.  No hydronephrosis.

Attempts are being made to contact the ordering physician or
physician on-call.

## 2022-10-14 ENCOUNTER — Ambulatory Visit: Payer: Self-pay | Admitting: Student

## 2022-10-14 ENCOUNTER — Ambulatory Visit (INDEPENDENT_AMBULATORY_CARE_PROVIDER_SITE_OTHER): Payer: Commercial Managed Care - HMO | Admitting: Student

## 2022-10-14 ENCOUNTER — Other Ambulatory Visit: Payer: Self-pay

## 2022-10-14 ENCOUNTER — Encounter: Payer: Self-pay | Admitting: Student

## 2022-10-14 VITALS — BP 116/74 | HR 60 | Wt 239.0 lb

## 2022-10-14 DIAGNOSIS — M25569 Pain in unspecified knee: Secondary | ICD-10-CM | POA: Diagnosis not present

## 2022-10-14 DIAGNOSIS — Z0000 Encounter for general adult medical examination without abnormal findings: Secondary | ICD-10-CM

## 2022-10-14 DIAGNOSIS — M1710 Unilateral primary osteoarthritis, unspecified knee: Secondary | ICD-10-CM

## 2022-10-14 DIAGNOSIS — G8929 Other chronic pain: Secondary | ICD-10-CM

## 2022-10-14 MED ORDER — DICLOFENAC SODIUM 1 % EX GEL: 4.0000 g | Freq: Four times a day (QID) | CUTANEOUS | 2 refills | Status: DC

## 2022-10-14 MED ORDER — DICLOFENAC SODIUM 1 % EX GEL: 4.0000 g | Freq: Four times a day (QID) | CUTANEOUS | Status: DC

## 2022-10-14 NOTE — Patient Instructions (Signed)
It was great to see you today! Thank you for choosing [facility] for your primary care.   Today we addressed: Knee pain - I sent in a topical gel called Voltaren gel. This medication is an anti-inflammatory agent (like Advil) that you can apply up to 4 times per day.  I would try taking Tylenol over the counter up to 2,000 mg per day. You do not have to take this regularly, only when you are having flares or when you are going to workout.  I would like for you to follow up with your PCP Dr. Nancy Fetter in about 4 weeks to see how the medicine is working and follow up with your diabetes and blood work.   You should return to our clinic Return in about 4 weeks (around 11/11/2022).  I recommend that you always bring your medications to each appointment as this makes it easy to ensure you are on the correct medications and helps [place] not miss refills when you need them.  Please arrive 15 minutes before your appointment to ensure smooth check in process.  We appreciate your efforts in making this happen.  Please call the clinic at [phone] if your symptoms worsen or you have any concerns.  Thank you for allowing me to participate in your care, Dr. Sabra Heck

## 2022-10-14 NOTE — Telephone Encounter (Signed)
Patient's wife calls nurse line regarding prescription for voltaren gel. She reports that she is at the pharmacy now and they do not have rx.   Order was placed for clinic administered medication.   Please place new order. I have pended medication to this encounter. Please update with dispense quantity and refills.   Talbot Grumbling, RN

## 2022-10-14 NOTE — Assessment & Plan Note (Signed)
Patient is due for yearly blood work and follow up for his diabetes.  - instructed to schedule an appointment in one month  - discuss colonoscopy at next visit  - he would like to discuss diet changes, consider referral to nutrition with Dr. Jenne Campus

## 2022-10-14 NOTE — Assessment & Plan Note (Addendum)
Chronic knee pain, worse on the right, suggestive of osteoarthritis. Since patient has not tried analgesic therapies and it is not affective his daily activities will start with conservative management.  - prescribed Voltaren gel  - discussed utility of knee X-rays, will defer until patient requests or change in therapy  - discussed long term outcomes and potential for knee replacement in the distant future.  - discussed weight loss to help with his symptoms, patient would like to schedule an appointment to discuss his diabetes and weight loss.

## 2022-10-14 NOTE — Progress Notes (Signed)
    SUBJECTIVE:   CHIEF COMPLAINT / HPI:   Henry May is a 56 y.o. male  presenting for worsening bilateral knee pain starting 2-3 years ago. He reports bilateral knee pain that has gotten progressively worse with his right knee bothering him more than the left. It does not affect his daily activities but he is noticing more pain when he is working out or doing physical activity. He has not tried anything over the counter to help with his pain. He denies fever, chills, joint swelling or tenderness.   PERTINENT  PMH / PSH: hx DVT, hx of acute respiratory failure due to COVID infection, DM  OBJECTIVE:   BP 116/74   Pulse 60   Wt 239 lb (108.4 kg)   SpO2 96%   BMI 31.53 kg/m   Well-appearing, no acute distress Cardio: Regular rate, regular rhythm, no murmurs on exam. Pulm: Clear, no wheezing, no crackles. No increased work of breathing Abdominal: bowel sounds present, soft, non-tender, non-distended Extremities: no peripheral edema   MSK: no swelling or tenderness with palpation around the knee joints, anterior drawer test negative, valgus and varus testing negative  ASSESSMENT/PLAN:   Knee pain Chronic knee pain, worse on the right, suggestive of osteoarthritis. Since patient has not tried analgesic therapies and it is not affective his daily activities will start with conservative management.  - prescribed Voltaren gel  - discussed utility of knee X-rays, will defer until patient requests or change in therapy  - discussed long term outcomes and potential for knee replacement in the distant future.  - discussed weight loss to help with his symptoms, patient would like to schedule an appointment to discuss his diabetes and weight loss.   Healthcare maintenance Patient is due for yearly blood work and follow up for his diabetes.  - instructed to schedule an appointment in one month  - discuss colonoscopy at next visit  - he would like to discuss diet changes, consider referral  to nutrition with Dr. Sim Boast, [place]

## 2023-12-23 ENCOUNTER — Ambulatory Visit: Payer: Commercial Managed Care - HMO | Admitting: Student

## 2023-12-23 ENCOUNTER — Encounter: Payer: Self-pay | Admitting: Student

## 2023-12-23 VITALS — BP 138/80 | HR 69 | Ht 73.0 in | Wt 222.4 lb

## 2023-12-23 DIAGNOSIS — E119 Type 2 diabetes mellitus without complications: Secondary | ICD-10-CM | POA: Diagnosis not present

## 2023-12-23 DIAGNOSIS — C169 Malignant neoplasm of stomach, unspecified: Secondary | ICD-10-CM | POA: Diagnosis not present

## 2023-12-23 LAB — POCT GLYCOSYLATED HEMOGLOBIN (HGB A1C): HbA1c, POC (controlled diabetic range): 6 % (ref 0.0–7.0)

## 2023-12-23 NOTE — Progress Notes (Signed)
    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  He went on vacation to [place].  His niece is a GI physician in [place], and had ordered laboratory workup.  He initially started treatment for H. pylori, however underwent EGD which found infiltrative lesion-biopsy confirmed gastric carcinoma.  Unfortunately, he was not told of this diagnosis and news was delivered by PCP in office today.  Patient and spouse are eager to pursue further workup and treatment.  OBJECTIVE:   BP (!) 140/80   Pulse 69   Ht 6' 1 (1.854 m)   Wt 222 lb 6 oz (100.9 kg)   SpO2 98%   BMI 29.34 kg/m    General: NAD, pleasant Cardio: RRR, no MRG. Cap Refill <2s. Respiratory: CTAB, normal wob on RA GI: Abdomen is soft, not tender, not distended. BS present Skin: Warm and dry  ASSESSMENT/PLAN:   Assessment & Plan Gastric carcinoma [facility]) 58 year old male with weight loss, dyspepsia found to have gastric carcinoma by EGD done in [place]).  Pathology reported gastric carcinoma, pending additional stains which patient will have mailed to him.  He brought in documentation of all imaging and workup he had in [place]-with patient's permission this was scanned to chart and labeled in media tab. - Continue omeprazole  20 mg twice daily - Add OTC Pepto-Bismol for dyspepsia - Urgent consult to gastroenterology, in case further biopsy or studies are required - Urgent consult to oncology, to initiate further workup/potential treatment of condition Diabetes mellitus without complication (HCC) A1c 6.0.  Diet controlled. - Consider UACR at next visit  Gladis Church, DO [facility]

## 2023-12-23 NOTE — Patient Instructions (Signed)
 It was great to see you! Thank you for allowing me to participate in your care!   I recommend that you always bring your medications to each appointment as this makes it easy to ensure we are on the correct medications and helps us  not miss when refills are needed.  Our plans for today:  -I have sent urgent referrals to gastroenterology and oncology.  I have sent referral to gastroenterology, in case the need to repeat any studies.  I have sent a referral to oncology for further management of your newly found cancer.  At this time, we do not know the staging or treatment options-GI and oncology will assist. -Please take Pepto-Bismol in addition to omeprazole , you can get this medication over-the-counter to help with reflux and stomach discomfort. -Once you have appointments with GI and oncology, please schedule appointment to follow-up with me   Take care and seek immediate care sooner if you develop any concerns. Please remember to show up 15 minutes before your scheduled appointment time!  Gladis Church, DO [facility]

## 2023-12-23 NOTE — Assessment & Plan Note (Signed)
 A1c 6.0.  Diet controlled. - Consider UACR at next visit

## 2023-12-24 ENCOUNTER — Encounter: Payer: Self-pay | Admitting: *Deleted

## 2023-12-29 ENCOUNTER — Ambulatory Visit: Payer: No Typology Code available for payment source | Admitting: Gastroenterology

## 2023-12-29 ENCOUNTER — Encounter: Payer: Self-pay | Admitting: Gastroenterology

## 2023-12-29 ENCOUNTER — Other Ambulatory Visit (INDEPENDENT_AMBULATORY_CARE_PROVIDER_SITE_OTHER): Payer: No Typology Code available for payment source

## 2023-12-29 ENCOUNTER — Telehealth: Payer: Self-pay | Admitting: Gastroenterology

## 2023-12-29 VITALS — BP 118/74 | HR 64 | Ht 72.0 in | Wt 218.4 lb

## 2023-12-29 DIAGNOSIS — C169 Malignant neoplasm of stomach, unspecified: Secondary | ICD-10-CM | POA: Diagnosis not present

## 2023-12-29 LAB — CBC WITH DIFFERENTIAL/PLATELET
Basophils Absolute: 0.1 10*3/uL (ref 0.0–0.1)
Basophils Relative: 0.9 % (ref 0.0–3.0)
Eosinophils Absolute: 0.2 10*3/uL (ref 0.0–0.7)
Eosinophils Relative: 3 % (ref 0.0–5.0)
HCT: 48.9 % (ref 39.0–52.0)
Hemoglobin: 16 g/dL (ref 13.0–17.0)
Lymphocytes Relative: 20.7 % (ref 12.0–46.0)
Lymphs Abs: 1.7 10*3/uL (ref 0.7–4.0)
MCHC: 32.8 g/dL (ref 30.0–36.0)
MCV: 94.8 fL (ref 78.0–100.0)
Monocytes Absolute: 0.6 10*3/uL (ref 0.1–1.0)
Monocytes Relative: 7.3 % (ref 3.0–12.0)
Neutro Abs: 5.6 10*3/uL (ref 1.4–7.7)
Neutrophils Relative %: 68.1 % (ref 43.0–77.0)
Platelets: 214 10*3/uL (ref 150.0–400.0)
RBC: 5.16 Mil/uL (ref 4.22–5.81)
RDW: 13.5 % (ref 11.5–15.5)
WBC: 8.3 10*3/uL (ref 4.0–10.5)

## 2023-12-29 LAB — COMPREHENSIVE METABOLIC PANEL
ALT: 29 U/L (ref 0–53)
AST: 21 U/L (ref 0–37)
Albumin: 4.2 g/dL (ref 3.5–5.2)
Alkaline Phosphatase: 89 U/L (ref 39–117)
BUN: 15 mg/dL (ref 6–23)
CO2: 26 meq/L (ref 19–32)
Calcium: 9.2 mg/dL (ref 8.4–10.5)
Chloride: 104 meq/L (ref 96–112)
Creatinine, Ser: 0.99 mg/dL (ref 0.40–1.50)
GFR: 83.39 mL/min (ref >60.00)
Glucose, Bld: 90 mg/dL (ref 70–99)
Potassium: 4 meq/L (ref 3.5–5.1)
Sodium: 139 meq/L (ref 135–145)
Total Bilirubin: 0.3 mg/dL (ref 0.2–1.2)
Total Protein: 7.4 g/dL (ref 6.0–8.3)

## 2023-12-29 LAB — PROTIME-INR
INR: 1.2 {ratio} — ABNORMAL HIGH (ref 0.8–1.0)
Prothrombin Time: 12.3 s (ref 9.6–13.1)

## 2023-12-29 NOTE — Progress Notes (Signed)
 Chief Complaint: For further GI evaluation  Referring Provider:  [name], DO      ASSESSMENT AND PLAN;   #1. Gastric carcinoma (Dx @ [place]), neg CT chest/Abdo/pelvis for any metastatic lesions.   Plan: -CBC, CMP, INR -semi urgent EGD (liquid diet x 24hrs before) -Thereafter PET/EUS/both. -Continue Omeprazole   -Patient  and patient's wife would take CT CD to radiology to upload images and for reading.  We have talked to radiology at [facility].   HPI:    Henry May is a 58 y.o. 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 He went on vacation to [place]. His niece is a GI physician in [place], and had ordered laboratory workup. + HP breath test - given treatment (pepto/omeprazole /biaxin/amox/metro x 14 days). Still on pepto- had dark stools.  Underwent EGD 11/21/2023 in [place] (scanned under media- poor image) -Some retained food (was awake during the EGD, just spray) -Chronic antral gastritis -Infiltrative lesion of the gastric body (biopsied) -Bx: Diffuse carcinoma.  CT AP December 23, 2023: (In [place], scanned under media) -Diffuse fatty liver -Gastric wall thickening potentially related to neoplasia -No metastatic lesions  CBC-December 23, 2023 (poor image)-appears to be normal  Per oncology navigator Marcelle [facility] RN)-records have been reviewed by oncology.  Needs repeat EGD with biopsies for a more definitive diagnosis and special stains.  Wt loss 12lb since Dec 2024  Had dark stools likely due to Pepto-Bismol.  No nausea, vomiting, heartburn, regurgitation, odynophagia or dysphagia.  No significant diarrhea or constipation.  No melena or hematochezia. No abdominal pain.  Results   LABS CEA 19-9: 12.1 CEA: 3.91 Hemoglobin: 17.3 g/dL Platelets: 739 x 89^6/O Creatinine: 1.44 mg/dL Alkaline Phosphatase: 897 U/L GGT: 37  U/L  RADIOLOGY CT of the chest: Diffuse emphysema, degenerative changes in thoracic vertebrae, fibrous traction, bilateral traction bronchiectasis CT Abdo/pelvis as above  DIAGNOSTIC Endoscopy: Esophagus normal, stomach with gastric residue despite prolonged fasting, infiltrative lesion in gastric body, antral mucosa normal, pylorus normal, duodenum normal  PATHOLOGY Biopsy: Diffuse hypercellular lesion, diffuse carcinoma        SH-married, truck driver, 1 son, 2 daughters.  No alcohol.  No smoking. Past Medical History:  Diagnosis Date  . Acute respiratory failure (HCC) 08/06/2020  . Chronic cough    SINCE 08-06-2020 COVID PNEUMONIA  . COVID 08/06/2020  . Dvt femoral (deep venous thrombosis) (HCC) 08/2020   LEFT LEG   . History of blood transfusion 08/2020   AFTER MI 5 UNITS GIVEN  . History of kidney stones   . Hypertension   . Numbness    LEFT LEG AT TIMES  . Pneumonia 08/06/2020   COVID PNEUMONIA    Past Surgical History:  Procedure Laterality Date  . CYSTOSCOPY WITH STENT PLACEMENT Left 10/06/2020   Procedure: CYSTOSCOPY WITH STENT PLACEMENT;  Surgeon: Devere Lonni Righter, MD;  Location: [facility];  Service: Urology;  Laterality: Left;  . CYSTOSCOPY/URETEROSCOPY/HOLMIUM LASER/STENT PLACEMENT Left 11/14/2020   Procedure: CYSTOSCOPY/URETEROSCOPY/HOLMIUM LASER/STENT PLACEMENT;  Surgeon: Devere Lonni Righter, MD;  Location: [facility];  Service: Urology;  Laterality: Left;  ONLY NEEDS 45 MIN  . IR IVC FILTER PLMT / S&I /IMG GUID/MOD SED  08/24/2020    Family History  Problem Relation Age of Onset  . Cancer Mother        type unknown  . Diabetes Mellitus II Neg Hx  Social History   Tobacco Use  . Smoking status: Former    Current packs/day: 0.00    Types: Cigarettes    Quit date: 1986    Years since quitting: 39.0    Passive exposure: Never  . Smokeless tobacco: Never  Vaping Use  . Vaping status: Never Used  Substance Use Topics  . Alcohol use: Yes     Comment: occasional  . Drug use: Never    Current Outpatient Medications  Medication Sig Dispense Refill  . bismuth subsalicylate (PEPTO BISMOL) 262 MG/15ML suspension Take 30 mLs by mouth 2 (two) times daily.    . diclofenac  Sodium (VOLTAREN ) 1 % GEL Apply 4 g topically 4 (four) times daily. 4 g 2  . omeprazole  (PRILOSEC) 20 MG capsule Take 20 mg by mouth every 12 (twelve) hours.     No current facility-administered medications for this visit.    No Known Allergies  Review of Systems:  Constitutional: Denies fever, chills, diaphoresis, appetite change and fatigue.  HEENT: Denies photophobia, eye pain, redness, hearing loss, ear pain, congestion, sore throat, rhinorrhea, sneezing, mouth sores, neck pain, neck stiffness and tinnitus.   Respiratory: Denies SOB, DOE, cough, chest tightness,  and wheezing.   Cardiovascular: Denies chest pain, palpitations and leg swelling.  Genitourinary: Denies dysuria, urgency, frequency, hematuria, flank pain and difficulty urinating.  Musculoskeletal: Denies myalgias, back pain, joint swelling, arthralgias and gait problem.  Skin: No rash.  Neurological: Denies dizziness, seizures, syncope, weakness, light-headedness, numbness and headaches.  Hematological: Denies adenopathy. Easy bruising, personal or family bleeding history  Psychiatric/Behavioral: No anxiety or depression     Physical Exam:    BP 118/74 (BP Location: Left Arm, Patient Position: Sitting, Cuff Size: Large)   Pulse 64   Ht 6' (1.829 m)   Wt 218 lb 6 oz (99.1 kg)   BMI 29.62 kg/m  Wt Readings from Last 3 Encounters:  12/29/23 218 lb 6 oz (99.1 kg)  12/23/23 222 lb 6 oz (100.9 kg)  10/14/22 239 lb (108.4 kg)   Constitutional:  Well-developed, in no acute distress. Psychiatric: Normal mood and affect. Behavior is normal. HEENT: Pupils normal.  Conjunctivae are normal. No scleral icterus. Neck supple.  No supraclavicular lymphadenopathy. Cardiovascular: Normal rate,  regular rhythm. No edema Pulmonary/chest: Effort normal and breath sounds normal. No wheezing, rales or rhonchi. Abdominal: Soft, nondistended. Nontender. Bowel sounds active throughout. There are no masses palpable. No hepatomegaly. Rectal: Deferred Neurological: Alert and oriented to person place and time. Skin: Skin is warm and dry. No rashes noted.  Data Reviewed: I have personally reviewed following labs and imaging studies  CBC:    Latest Ref Rng & Units 11/06/2020    9:14 AM 10/05/2020    7:43 PM 09/03/2020    8:36 AM  CBC  WBC 4.0 - 10.5 K/uL 8.0  14.8  10.6   Hemoglobin 13.0 - 17.0 g/dL 86.1  86.2  87.7   Hematocrit 39.0 - 52.0 % 42.8  44.0  38.0   Platelets 150 - 400 K/uL 251  355  353     CMP:    Latest Ref Rng & Units 11/06/2020    9:14 AM 10/05/2020    7:43 PM 09/03/2020    8:36 AM  CMP  Glucose 70 - 99 mg/dL 97  884  899   BUN 6 - 20 mg/dL 14  13  21    Creatinine 0.61 - 1.24 mg/dL 9.06  8.78  9.11   Sodium 135 - 145 mmol/L  138  136  134   Potassium 3.5 - 5.1 mmol/L 4.9  4.4  4.2   Chloride 98 - 111 mmol/L 103  101  99   CO2 22 - 32 mmol/L 25  25  27    Calcium  8.9 - 10.3 mg/dL 9.7  9.7  8.7         Anselm Bring, MD 12/29/2023, 3:04 PM  Cc: [name], DO

## 2023-12-29 NOTE — Telephone Encounter (Signed)
 Patient wife called and stated that she is went over to [facility] but they are requesting who in the imaging department did Dr. Charlanne speak to in order for them to get the patient to the right location in the radiology department. Patient are requesting a call back. Please advise .

## 2023-12-29 NOTE — Patient Instructions (Signed)
  Take imaging disc to [facility] and they will upload the images and send it to Dr Charlanne  Your provider has requested that you go to the basement level for lab work before leaving today. Press B on the elevator. The lab is located at the first door on the left as you exit the elevator.  You have been scheduled for an endoscopy. Please follow written instructions given to you at your visit today.  If you use inhalers (even only as needed), please bring them with you on the day of your procedure.  If you take any of the following medications, they will need to be adjusted prior to your procedure:   DO NOT TAKE 7 DAYS PRIOR TO TEST- Trulicity (dulaglutide) Ozempic, Wegovy (semaglutide) Mounjaro (tirzepatide) Bydureon Bcise (exanatide extended release)  DO NOT TAKE 1 DAY PRIOR TO YOUR TEST Rybelsus (semaglutide) Adlyxin (lixisenatide) Victoza (liraglutide) Byetta (exanatide) ___________________________________________________________________________   Thank you,  Dr. Lynnie Charlanne

## 2023-12-30 ENCOUNTER — Other Ambulatory Visit (HOSPITAL_COMMUNITY): Payer: Self-pay | Admitting: Gastroenterology

## 2023-12-30 ENCOUNTER — Inpatient Hospital Stay
Admission: RE | Admit: 2023-12-30 | Discharge: 2023-12-30 | Disposition: A | Discharge status: Home / Self Care | Payer: Self-pay | Source: Ambulatory Visit | Attending: Gastroenterology | Admitting: Gastroenterology

## 2023-12-30 DIAGNOSIS — N200 Calculus of kidney: Secondary | ICD-10-CM

## 2023-12-30 NOTE — Telephone Encounter (Signed)
 Inbound call from [place] stating films are uploaded. [phone]

## 2023-12-30 NOTE — Telephone Encounter (Signed)
 Henry May the pt was seen yesterday and has a question about information given to him can you please help? Thank you

## 2023-12-30 NOTE — Telephone Encounter (Signed)
 Spoke to [place] in radiology [facility] said they can come. I might have to do a outside film order but she will let me know. Patient wife made aware. She on the way to the hospital now

## 2023-12-30 NOTE — Telephone Encounter (Signed)
 Films in chart and dr can see them

## 2023-12-31 ENCOUNTER — Ambulatory Visit: Payer: No Typology Code available for payment source | Admitting: Gastroenterology

## 2023-12-31 ENCOUNTER — Other Ambulatory Visit: Payer: Self-pay

## 2023-12-31 ENCOUNTER — Encounter: Payer: Self-pay | Admitting: Gastroenterology

## 2023-12-31 ENCOUNTER — Telehealth: Payer: Self-pay

## 2023-12-31 VITALS — BP 142/89 | HR 54 | Temp 97.2°F | Resp 26 | Ht 72.0 in | Wt 218.6 lb

## 2023-12-31 DIAGNOSIS — K449 Diaphragmatic hernia without obstruction or gangrene: Secondary | ICD-10-CM

## 2023-12-31 DIAGNOSIS — C169 Malignant neoplasm of stomach, unspecified: Secondary | ICD-10-CM

## 2023-12-31 DIAGNOSIS — K2950 Unspecified chronic gastritis without bleeding: Secondary | ICD-10-CM

## 2023-12-31 MED ORDER — SODIUM CHLORIDE 0.9 % IV SOLN: 500.0000 mL | Freq: Once | INTRAVENOUS | Status: DC

## 2023-12-31 NOTE — Patient Instructions (Signed)
  ONCOLOGY APPOINTMENT TO BE SCHEDULED    PET SCAN TO BE ORDERED    AWAIT PATHOLOGY RESULTS FROM BIOPSIES PER DR GUPTA    YOU HAD AN ENDOSCOPIC PROCEDURE TODAY AT THE [facility]:   Refer to the procedure report that was given to you for any specific questions about what was found during the examination.  If the procedure report does not answer your questions, please call your gastroenterologist to clarify.  If you requested that your care partner not be given the details of your procedure findings, then the procedure report has been included in a sealed envelope for you to review at your convenience later.  YOU SHOULD EXPECT: Some feelings of bloating in the abdomen. Passage of more gas than usual.  Walking can help get rid of the air that was put into your GI tract during the procedure and reduce the bloating. If you had a lower endoscopy (such as a colonoscopy or flexible sigmoidoscopy) you may notice spotting of blood in your stool or on the toilet paper. If you underwent a bowel prep for your procedure, you may not have a normal bowel movement for a few days.  Please Note:  You might notice some irritation and congestion in your nose or some drainage.  This is from the oxygen  used during your procedure.  There is no need for concern and it should clear up in a day or so.  SYMPTOMS TO REPORT IMMEDIATELY:  Following upper endoscopy (EGD)  Vomiting of blood or coffee ground material  New chest pain or pain under the shoulder blades  Painful or persistently difficult swallowing  New shortness of breath  Fever of 100F or higher  Black, tarry-looking stools  For urgent or emergent issues, a gastroenterologist can be reached at any hour by calling [phone]. Do not use MyChart messaging for urgent concerns.    DIET:  We do recommend a small meal at first, but then you may proceed to your regular diet.  Drink plenty of fluids but you should avoid alcoholic beverages for  24 hours.  ACTIVITY:  You should plan to take it easy for the rest of today and you should NOT DRIVE or use heavy machinery until tomorrow (because of the sedation medicines used during the test).    FOLLOW UP: Our staff will call the number listed on your records the next business day following your procedure.  We will call around 7:15- 8:00 am to check on you and address any questions or concerns that you may have regarding the information given to you following your procedure. If we do not reach you, we will leave a message.     If any biopsies were taken you will be contacted by phone or by letter within the next 1-3 weeks.  Please call us  at [phone] if you have not heard about the biopsies in 3 weeks.    SIGNATURES/CONFIDENTIALITY: You and/or your care partner have signed paperwork which will be entered into your electronic medical record.  These signatures attest to the fact that that the information above on your After Visit Summary has been reviewed and is understood.  Full responsibility of the confidentiality of this discharge information lies with you and/or your care-partner.

## 2023-12-31 NOTE — Progress Notes (Signed)
 Called to room to assist during endoscopic procedure.  Patient ID and intended procedure confirmed with present staff. Received instructions for my participation in the procedure from the performing physician.

## 2023-12-31 NOTE — Progress Notes (Signed)
 Report to PACU, RN, vss, BBS= Clear.

## 2023-12-31 NOTE — Progress Notes (Signed)
 Chief Complaint: For further GI evaluation  Referring Provider:  Lavada Porteous, DO      ASSESSMENT AND PLAN;   #1. 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   Plan: -CBC, CMP, INR -semi urgent EGD (liquid diet x 24hrs before) -Thereafter PET/EUS/both. -Continue Omeprazole   -Patient  and patient's wife would take CT CD to radiology to upload images and for reading.  We have talked to radiology at [facility].   HPI:    Henry May is a 58 y.o. male  With H/O ILD (not on O2), OA, H/O DVT 2021 s/p IVC filter (not on [facility]) Seen as emergency workin  Patient developed early satiety, dyspepsia, weight loss in early December. He went on vacation to [place]. His niece is a GI physician in [place], and had ordered laboratory workup. + HP breath test - given treatment (pepto/omeprazole /biaxin/amox/metro x 14 days). Still on pepto- had dark stools.  Underwent EGD 11/21/2023 in [place] (scanned under media- poor image) -Some retained food (was awake during the EGD, just spray) -Chronic antral gastritis -Infiltrative lesion of the gastric body (biopsied) -Bx: Diffuse carcinoma.  CT AP December 23, 2023: (In [place], scanned under media) -Diffuse fatty liver -Gastric wall thickening potentially related to neoplasia -No metastatic lesions  CBC-December 23, 2023 (poor image)-appears to be normal  Per oncology navigator Jenette Mitchell [facility] RN)-records have been reviewed by oncology.  Needs repeat EGD with biopsies for a more definitive diagnosis and special stains.  Wt loss 12lb since Dec 2024  Had dark stools likely due to Pepto-Bismol.  No nausea, vomiting, heartburn, regurgitation, odynophagia or dysphagia.  No significant diarrhea or constipation.  No melena or hematochezia. No abdominal pain.  Results   LABS CEA 19-9: 12.1 CEA: 3.91 Hemoglobin: 17.3 g/dL Platelets: 161 x 09^6/E Creatinine: 1.44 mg/dL Alkaline Phosphatase: 454 U/L GGT: 37  U/L  RADIOLOGY CT of the chest: Diffuse emphysema, degenerative changes in thoracic vertebrae, fibrous traction, bilateral traction bronchiectasis CT Abdo/pelvis as above  DIAGNOSTIC Endoscopy: Esophagus normal, stomach with gastric residue despite prolonged fasting, infiltrative lesion in gastric body, antral mucosa normal, pylorus normal, duodenum normal  PATHOLOGY Biopsy: Diffuse hypercellular lesion, diffuse carcinoma        SH-married, truck driver, 1 son, 2 daughters.  No alcohol.  No smoking. Past Medical History:  Diagnosis Date  . Acute respiratory failure (HCC) 08/06/2020  . Chronic cough    SINCE 08-06-2020 COVID PNEUMONIA  . COVID 08/06/2020  . Dvt femoral (deep venous thrombosis) (HCC) 08/2020   LEFT LEG   . History of blood transfusion 08/2020   AFTER MI 5 UNITS GIVEN  . History of kidney stones   . Hypertension   . Numbness    LEFT LEG AT TIMES  . Pneumonia 08/06/2020   COVID PNEUMONIA    Past Surgical History:  Procedure Laterality Date  . CYSTOSCOPY WITH STENT PLACEMENT Left 10/06/2020   Procedure: CYSTOSCOPY WITH STENT PLACEMENT;  Surgeon: Adelbert Homans, MD;  Location: [facility];  Service: Urology;  Laterality: Left;  . CYSTOSCOPY/URETEROSCOPY/HOLMIUM LASER/STENT PLACEMENT Left 11/14/2020   Procedure: CYSTOSCOPY/URETEROSCOPY/HOLMIUM LASER/STENT PLACEMENT;  Surgeon: Adelbert Homans, MD;  Location: [facility];  Service: Urology;  Laterality: Left;  ONLY NEEDS 45 MIN  . IR IVC FILTER PLMT / S&I /IMG GUID/MOD SED  08/24/2020    Family History  Problem Relation Age of Onset  . Cancer Mother        type unknown  . Diabetes Mellitus II Neg Hx  Social History   Tobacco Use  . Smoking status: Former    Current packs/day: 0.00    Types: Cigarettes    Quit date: 1986    Years since quitting: 39.0    Passive exposure: Never  . Smokeless tobacco: Never  Vaping Use  . Vaping status: Never Used  Substance Use Topics  . Alcohol use: Yes     Comment: occasional  . Drug use: Never    Current Outpatient Medications  Medication Sig Dispense Refill  . bismuth subsalicylate (PEPTO BISMOL) 262 MG/15ML suspension Take 30 mLs by mouth 2 (two) times daily.    . omeprazole  (PRILOSEC) 20 MG capsule Take 20 mg by mouth every 12 (twelve) hours.    . diclofenac  Sodium (VOLTAREN ) 1 % GEL Apply 4 g topically 4 (four) times daily. (Patient not taking: Reported on 12/31/2023) 4 g 2   No current facility-administered medications for this visit.    No Known Allergies  Review of Systems:  Constitutional: Denies fever, chills, diaphoresis, appetite change and fatigue.  HEENT: Denies photophobia, eye pain, redness, hearing loss, ear pain, congestion, sore throat, rhinorrhea, sneezing, mouth sores, neck pain, neck stiffness and tinnitus.   Respiratory: Denies SOB, DOE, cough, chest tightness,  and wheezing.   Cardiovascular: Denies chest pain, palpitations and leg swelling.  Genitourinary: Denies dysuria, urgency, frequency, hematuria, flank pain and difficulty urinating.  Musculoskeletal: Denies myalgias, back pain, joint swelling, arthralgias and gait problem.  Skin: No rash.  Neurological: Denies dizziness, seizures, syncope, weakness, light-headedness, numbness and headaches.  Hematological: Denies adenopathy. Easy bruising, personal or family bleeding history  Psychiatric/Behavioral: No anxiety or depression     Physical Exam:    BP (!) 142/89   Pulse (!) 54   Temp (!) 97.2 F (36.2 C) (Skin)   Resp (!) 26   Ht 6' (1.829 m)   Wt 218 lb 9.6 oz (99.2 kg)   SpO2 98%   BMI 29.65 kg/m  Wt Readings from Last 3 Encounters:  12/31/23 218 lb 9.6 oz (99.2 kg)  12/29/23 218 lb 6 oz (99.1 kg)  12/23/23 222 lb 6 oz (100.9 kg)   Constitutional:  Well-developed, in no acute distress. Psychiatric: Normal mood and affect. Behavior is normal. HEENT: Pupils normal.  Conjunctivae are normal. No scleral icterus. Neck supple.  No  supraclavicular lymphadenopathy. Cardiovascular: Normal rate, regular rhythm. No edema Pulmonary/chest: Effort normal and breath sounds normal. No wheezing, rales or rhonchi. Abdominal: Soft, nondistended. Nontender. Bowel sounds active throughout. There are no masses palpable. No hepatomegaly. Rectal: Deferred Neurological: Alert and oriented to person place and time. Skin: Skin is warm and dry. No rashes noted.  Data Reviewed: I have personally reviewed following labs and imaging studies  CBC:    Latest Ref Rng & Units 12/29/2023    4:18 PM 11/06/2020    9:14 AM 10/05/2020    7:43 PM  CBC  WBC 4.0 - 10.5 K/uL 8.3  8.0  14.8   Hemoglobin 13.0 - 17.0 g/dL 40.9  81.1  91.4   Hematocrit 39.0 - 52.0 % 48.9  42.8  44.0   Platelets 150.0 - 400.0 K/uL 214.0  251  355     CMP:    Latest Ref Rng & Units 12/29/2023    4:18 PM 11/06/2020    9:14 AM 10/05/2020    7:43 PM  CMP  Glucose 70 - 99 mg/dL 90  97  782   BUN 6 - 23 mg/dL 15  14  13  Creatinine 0.40 - 1.50 mg/dL 6.04  5.40  9.81   Sodium 135 - 145 mEq/L 139  138  136   Potassium 3.5 - 5.1 mEq/L 4.0  4.9  4.4   Chloride 96 - 112 mEq/L 104  103  101   CO2 19 - 32 mEq/L 26  25  25    Calcium  8.4 - 10.5 mg/dL 9.2  9.7  9.7   Total Protein 6.0 - 8.3 g/dL 7.4     Total Bilirubin 0.2 - 1.2 mg/dL 0.3     Alkaline Phos 39 - 117 U/L 89     AST 0 - 37 U/L 21     ALT 0 - 53 U/L 29           Magnus Schuller, MD 12/31/2023, 3:46 PM  Cc: Lavada Porteous, DO

## 2023-12-31 NOTE — Op Note (Addendum)
 [facility] Patient Name: Henry May Procedure Date: 12/31/2023 10:14 AM MRN: [id number] Endoscopist: Lajuan Pila , MD, [id number] Age: 58 Referring MD:  Date of Birth: 02-10-1966 Gender: Male Account #: [id number] Procedure:                Upper GI endoscopy Indications:              Prev Dx gastric mass on EGD in [place]. Limited                            examination d/t retained food. Neg CT                            chest/Abdo/pelvis for metastatic lesions Medicines:                Monitored Anesthesia Care Procedure:                Pre-Anesthesia Assessment:                           - Prior to the procedure, a History and Physical                            was performed, and patient medications and                            allergies were reviewed. The patient's tolerance of                            previous anesthesia was also reviewed. The risks                            and benefits of the procedure and the sedation                            options and risks were discussed with the patient.                            All questions were answered, and informed consent                            was obtained. Prior Anticoagulants: The patient has                            taken no anticoagulant or antiplatelet agents. ASA                            Grade Assessment: III - A patient with severe                            systemic disease. After reviewing the risks and                            benefits, the patient was deemed in satisfactory  condition to undergo the procedure.                           After obtaining informed consent, the endoscope was                            passed under direct vision. Throughout the                            procedure, the patient's blood pressure, pulse, and                            oxygen  saturations were monitored continuously. The                            GIF [id number] #[id number] was  introduced through the                            mouth, and advanced to the second part of duodenum.                            The upper GI endoscopy was accomplished without                            difficulty. The patient tolerated the procedure                            well. Scope In: Scope Out: Findings:                 The examined esophagus was normal. A small hiatal                            hernia was noted.                           A large, diffuse, friable, infiltrative, polypoid                            and ulcerated, circumferential mass was found in                            the gastric body with mild stenosis distal body.                            The scope could be passed beyond to normal                            antrum/pylorus. The mass came in within 2 cm of the                            GE junction. The fundus was normal. Biopsies were                            taken with  a cold forceps for histology. Multiple                            biopsies were also obtained from the normal                            antrum/pylorus.                           The examined duodenum was normal. Complications:            No immediate complications. Estimated Blood Loss:     Estimated blood loss: none. 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                            Biopsied.                           - Normal examined antrum/pylorus/duodenum. Recommendation:           - Patient has a contact number available for                            emergencies. The signs and symptoms of potential                            delayed complications were discussed with the                            patient. Return to normal activities tomorrow.                            Written discharge instructions were provided to the                            patient.                           - Resume previous diet.                            - Continue present medications.                           - Await pathology results.                           - PET scan.                           - FU with oncology.                           - No aspirin, ibuprofen, naproxen, or other  non-steroidal anti-inflammatory drugs.                           - The findings and recommendations were discussed                            with the patient's family. Lajuan Pila, MD 12/31/2023 10:56:11 AM This report has been signed electronically.

## 2023-12-31 NOTE — Telephone Encounter (Signed)
 Pt was scheduled for a PET scan on 01/12/2024 at [facility] at 11:30 AM. Pt to arrive at 11:00 AM. Nothing to eat or drink past midnight. Pt wife Benjiman Bras made aware. Pt already has Oncology referral sent in. Johana made aware that if she has not heard anything from the oncologist office by the end of the week then to please give our office a call back.  Johana verbalized understanding with all questions answered.

## 2023-12-31 NOTE — Progress Notes (Signed)
 Pt's states no medical or surgical changes since previsit or office visit.

## 2024-01-01 ENCOUNTER — Telehealth: Payer: Self-pay

## 2024-01-01 LAB — SURGICAL PATHOLOGY

## 2024-01-01 NOTE — Telephone Encounter (Signed)
Returned patient's wife call & she asked if images were uploaded to patient's chart from CT scan. They are now available in epic.

## 2024-01-01 NOTE — Telephone Encounter (Signed)
Patient called to follow up on results and a CD for her to pick up. Please advise.

## 2024-01-01 NOTE — Telephone Encounter (Signed)
  Follow up Call-     12/31/2023    9:30 AM  Call back number  Post procedure Call Back phone  # [phone]  Permission to leave phone message Yes     Patient questions:  Do you have a fever, pain , or abdominal swelling? No. Pain Score  0 *  Have you tolerated food without any problems? Yes.    Have you been able to return to your normal activities? Yes.    Do you have any questions about your discharge instructions: Diet   No. Medications  No. Follow up visit  No.  Do you have questions or concerns about your Care? No.  Actions: * If pain score is 4 or above: No action needed, pain <4.

## 2024-01-06 ENCOUNTER — Telehealth: Payer: Self-pay

## 2024-01-06 NOTE — Telephone Encounter (Signed)
-----   Message from April M sent at 01/06/2024  8:26 AM EST ----- Regarding: Peer-to-Peer Good Morning all, I spoke with the insurance rep for [employer] regarding this patient's Pet Scan for tomorrow. The case is still under review but they said anyone(dr/nurse) can call in and do a peer-to-peer to obtain the authorization. The Tracking number is [id number].  The Member ID is [id number] and number to call is [phone]. I have already sent over the records last week.  Thank you for your help!

## 2024-01-06 NOTE — Telephone Encounter (Signed)
Just spoke with insurance rep to provide Dr. Urban Gibson cell phone for peer to peer, however they stated they do not reach out to the provider & that he would need to call them directly at [phone]. Tracking number [id number]. MD made aware via secure chat d/t time sensitivity.

## 2024-01-07 ENCOUNTER — Encounter: Payer: Self-pay | Admitting: *Deleted

## 2024-01-07 ENCOUNTER — Encounter (HOSPITAL_COMMUNITY)
Admission: RE | Admit: 2024-01-07 | Discharge: 2024-01-07 | Disposition: A | Discharge status: Home / Self Care | Payer: No Typology Code available for payment source | Source: Ambulatory Visit | Attending: Gastroenterology | Admitting: Gastroenterology

## 2024-01-07 ENCOUNTER — Encounter: Payer: Self-pay | Admitting: Gastroenterology

## 2024-01-07 DIAGNOSIS — C169 Malignant neoplasm of stomach, unspecified: Secondary | ICD-10-CM | POA: Diagnosis present

## 2024-01-07 LAB — GLUCOSE, CAPILLARY: Glucose-Capillary: 113 mg/dL — ABNORMAL HIGH (ref 70–99)

## 2024-01-07 MED ORDER — FLUDEOXYGLUCOSE F - 18 (FDG) INJECTION
10.8800 | Freq: Once | INTRAVENOUS | Status: AC | PRN
  Administered 2024-01-07: 10.88 via INTRAVENOUS

## 2024-01-07 NOTE — Telephone Encounter (Signed)
Pateints wife called regarding an immune chemical markers report she received yesterday. Would like to know if she can email them to Dr. Chales Abrahams for review or take them to the patients appt at the hospital.

## 2024-01-07 NOTE — Progress Notes (Signed)
Molecular testing of Her2, MMR and PDL-1 combined positive score requested on accession number [id number].

## 2024-01-07 NOTE — Telephone Encounter (Signed)
Pt wife Henry May stated that she had labs from [place] that she requested Dr. Chales Abrahams to see.  Henry May was notified that she could take a picture of them and send them through my chart or bring them by the office and we can make a copy of them. Henry May verbalized understanding with all questions answered.

## 2024-01-07 NOTE — Progress Notes (Signed)
New Hematology/Oncology Consult   Requesting MD: Dr. Randye Lobo  [phone]      Reason for Consult: Gastric cancer  HPI: Henry May is a 58 year old man referred for a new diagnosis of gastric cancer.  He apparently was found to have a gastric mass on an EGD in [place].  The endoscopy report (scanned under media) is dated 11/21/2023.  Indicates an "infiltrative looking lesion" involving the gastric body.   Biopsy showed involvement by hypercellular lesion that suggests diffuse carcinoma by morphology.  CTs were completed at an outside facility 11/28/2023--diffuse fatty infiltration of the liver; gastric wall thickening; lymph nodes up to 6 mm in diameter near the stomach wall. When he returned to this area he was referred to gastroenterology.  On 12/31/2023 he underwent an upper endoscopy.  A large diffuse friable infiltrative polypoid and ulcerated circumferential mass was found in the gastric body with mild stenosis distal body.  The scope was passed beyond the mass, normal antrum/pylorus.  The mass came within 2 cm of the GE junction.  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  PET scan 01/07/2024 result pending.  [name] reports developing upper abdominal pain and early satiety in early November 2024.  He went on a planned vacation to [place] near the end of November.  He began to feel worse.  His niece who is a physician in [place] arranged for him to have evaluation with EGD findings as above.  He estimates he has lost 20+ pounds over the past several months.  Appetite overall is poor.  He has postprandial abdominal pain.  No dysphagia.  No fevers or sweats.  He denies bleeding.  He reports he completed treatment for H. pylori.   Past Medical History:  Diagnosis Date   Acute respiratory failure (HCC) 08/06/2020   Chronic cough    SINCE 08-06-2020 COVID PNEUMONIA   COVID 08/06/2020   Dvt femoral (deep venous thrombosis) (HCC) 08/2020    LEFT LEG    History of blood transfusion 08/2020   AFTER MI 5 UNITS GIVEN   History of kidney stones    Hypertension    Numbness    LEFT LEG AT TIMES   Pneumonia 08/06/2020   COVID PNEUMONIA     Past Surgical History:  Procedure Laterality Date   CYSTOSCOPY WITH STENT PLACEMENT Left 10/06/2020   Procedure: CYSTOSCOPY WITH STENT PLACEMENT;  Surgeon: Rene Paci, MD;  Location: [facility];  Service: Urology;  Laterality: Left;   CYSTOSCOPY/URETEROSCOPY/HOLMIUM LASER/STENT PLACEMENT Left 11/14/2020   Procedure: CYSTOSCOPY/URETEROSCOPY/HOLMIUM LASER/STENT PLACEMENT;  Surgeon: Rene Paci, MD;  Location: [facility];  Service: Urology;  Laterality: Left;  ONLY NEEDS 45 MIN   IR IVC FILTER PLMT / S&I /IMG GUID/MOD SED  08/24/2020     Current Outpatient Medications:    omeprazole (PRILOSEC) 20 MG capsule, Take 20 mg by mouth every 12 (twelve) hours., Disp: , Rfl: :    No Known Allergies:  FH: Mother deceased with breast cancer.  SOCIAL HISTORY: He lives in [place].  He is married.  He has 3 healthy children.  He works as a Naval architect.  No tobacco use since age 80.  Rare EtOH intake.  Review of Systems: No shortness of breath or cough.  No change in bowel habits.  No bloody or black stools.  No urinary symptoms.  No numbness or tingling in the hands or feet.  Physical Exam:  Blood pressure 138/81, pulse 71, temperature 98.1 F (36.7 C),  temperature source Temporal, resp. rate 18, height 6' (1.829 m), weight 216 lb 11.2 oz (98.3 kg), SpO2 99%.  HEENT: No thrush or ulcers. Lungs: Lungs clear bilaterally. Cardiac: Regular rate and rhythm. Abdomen: No hepatomegaly.  Firm fullness with associated tenderness right medial upper abdomen. Vascular: No leg edema. Lymph nodes: No palpable cervical, supraclavicular, axillary or inguinal lymph nodes. Neurologic: Alert and oriented. Skin: No rash.  LABS:  No results for input(s): "WBC", "HGB", "HCT", "PLT" in the  last 72 hours.  No results for input(s): "NA", "K", "CL", "CO2", "GLUCOSE", "BUN", "CREATININE", "CALCIUM" in the last 72 hours.    RADIOLOGY:  No results found.  Assessment and Plan:   Gastric cancer 11/21/2023 EGD-gastric body with "infiltrative looking lesion"; biopsy shows involvement of a hypercellular lesion that suggests diffuse carcinoma by morphology CTs abdomen/pelvis 11/28/2023-diffuse fatty infiltration of the liver; gastric wall thickening; lymph nodes up to 6 mm in diameter near the stomach wall. 12/31/2023 upper endoscopy-large diffuse friable infiltrative polypoid and ulcerated circumferential mass found in the gastric body.  Scope was passed beyond the mass with normal antrum/pylorus.  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 PET scan 01/07/2024-final report pending Early satiety, postprandial abdominal pain, weight loss secondary to #1 History of bilateral lower extremity DVT 2021-anticoagulation discontinued due to massive retroperitoneal bleed, IVC filter placed 08/24/2020 Hospitalized with acute respiratory failure due to COVID-19 08/06/2020 - 09/04/2020 History of massive retroperitoneal bleed secondary to anticoagulation September 2021  Mr. Sheer has been diagnosed with gastric cancer.  We reviewed the diagnosis with him and his family at today's visit.  He had a staging PET scan yesterday.  The final report is not available.  They understand the PET scan findings will help guide treatment recommendations.  We had preliminary discussion regarding chemotherapy, possible surgery.  Referral made to Dr. Freida Busman.  His case will be presented at an upcoming GI tumor conference.  We have requested Her2, PDL1 CPS and MMR testing.  He is experiencing early satiety and postprandial abdominal pain.  He has lost weight.  Referral made to the [facility] dietitian.  He will return for follow-up in 1 week for  further discussion regarding a treatment plan.  Patient seen with Dr. Truett Perna.    Lonna Cobb, NP 01/08/2024, 3:05 PM  This was a shared visit with Lonna Cobb.  Mr. Rakestraw was interviewed and examined.  I reviewed CT/PET images and endoscopy reports.  He has been diagnosed with adenocarcinoma of the stomach.  The final reading on the staging PET is pending.  We discussed the likely recommendation for perioperative chemotherapy if he appears to be a surgical candidate.  He will be referred for a staging EUS.  We we will follow-up on results on additional testing from the gastric biopsy tissue.  He will be referred for surgical consultation.  I will present his case at the GI tumor conference next week.  I was present for greater than 50% of today's visit.  I performed medical decision making.  Mancel Bale, MD

## 2024-01-08 ENCOUNTER — Encounter: Payer: Self-pay | Admitting: Nurse Practitioner

## 2024-01-08 ENCOUNTER — Encounter: Payer: Self-pay | Admitting: *Deleted

## 2024-01-08 ENCOUNTER — Inpatient Hospital Stay: Payer: No Typology Code available for payment source | Attending: Nurse Practitioner | Admitting: Nurse Practitioner

## 2024-01-08 ENCOUNTER — Other Ambulatory Visit: Payer: Self-pay | Admitting: *Deleted

## 2024-01-08 VITALS — BP 138/81 | HR 71 | Temp 98.1°F | Resp 18 | Ht 72.0 in | Wt 216.7 lb

## 2024-01-08 DIAGNOSIS — R634 Abnormal weight loss: Secondary | ICD-10-CM | POA: Insufficient documentation

## 2024-01-08 DIAGNOSIS — C169 Malignant neoplasm of stomach, unspecified: Secondary | ICD-10-CM | POA: Insufficient documentation

## 2024-01-08 DIAGNOSIS — G893 Neoplasm related pain (acute) (chronic): Secondary | ICD-10-CM | POA: Insufficient documentation

## 2024-01-08 DIAGNOSIS — K3189 Other diseases of stomach and duodenum: Secondary | ICD-10-CM

## 2024-01-08 DIAGNOSIS — Z86718 Personal history of other venous thrombosis and embolism: Secondary | ICD-10-CM | POA: Insufficient documentation

## 2024-01-08 DIAGNOSIS — C162 Malignant neoplasm of body of stomach: Secondary | ICD-10-CM | POA: Diagnosis not present

## 2024-01-08 DIAGNOSIS — R6881 Early satiety: Secondary | ICD-10-CM | POA: Insufficient documentation

## 2024-01-08 NOTE — Progress Notes (Signed)
PATIENT NAVIGATOR PROGRESS NOTE  Name: Henry May Date: 01/08/2024 MRN: [id number]  DOB: 04-Apr-1966   Reason for visit:  New patient appt  Comments:  Met with [name] and his wife and daughter during appt with Dr Truett Perna and Lonna Cobb NP   [facility] education provided Information on FLOT chemo provided Referrals to Nutrition and social work Molecular Her2, MMr and PDL-1 combined positive score ordered on 01/07/24 Added to GI conference 1/29 for full discussion Referral to Dr Freida Busman for surgical consult Provided information on filing for disability and FMLA Given Journeys notebook with disease specific information and contact numbers    Time spent counseling/coordinating care: > 60 minutes

## 2024-01-09 ENCOUNTER — Other Ambulatory Visit: Payer: Self-pay | Admitting: Nurse Practitioner

## 2024-01-09 ENCOUNTER — Telehealth: Payer: Self-pay | Admitting: Nurse Practitioner

## 2024-01-09 DIAGNOSIS — C162 Malignant neoplasm of body of stomach: Secondary | ICD-10-CM

## 2024-01-09 NOTE — Telephone Encounter (Signed)
I attempted to contact Henry May.  He was not available.  I spoke with his wife.  We discussed the PET scan findings including the presence of hypermetabolic lymph nodes in the gastrohepatic ligament and hypermetabolic activity involving the right lobe of the thyroid.  We talked about Dr. Kalman Drape recommendation for staging EUS.  We also discussed the need for a thyroid ultrasound.  She will relay this information to Henry May.  We are contacting Dr. Chales Abrahams for assistance with the staging EUS.  Order has been placed for the thyroid ultrasound.

## 2024-01-12 ENCOUNTER — Other Ambulatory Visit (HOSPITAL_COMMUNITY): Payer: No Typology Code available for payment source

## 2024-01-12 ENCOUNTER — Telehealth: Payer: Self-pay

## 2024-01-12 NOTE — Telephone Encounter (Signed)
-----   Message from Nurse Jerral Ralph sent at 01/09/2024  8:49 AM EST ----- Regarding: Recommend a staging EUS Dr Chales Abrahams,  Dr Truett Perna met with Mr Montesinos and family yesterday afternoon and in review of PET scan results recommends a staging  EUS, can that be arranged with your office?  Please let me know if there is anything you need from me  Thank you Laurena Spies Drawbridge Nurse Navigator [phone]

## 2024-01-12 NOTE — Telephone Encounter (Signed)
Please see note below and advise

## 2024-01-13 ENCOUNTER — Ambulatory Visit (HOSPITAL_COMMUNITY)
Admission: RE | Admit: 2024-01-13 | Discharge: 2024-01-13 | Disposition: A | Discharge status: Home / Self Care | Payer: No Typology Code available for payment source | Source: Ambulatory Visit | Attending: Nurse Practitioner | Admitting: Nurse Practitioner

## 2024-01-13 DIAGNOSIS — C162 Malignant neoplasm of body of stomach: Secondary | ICD-10-CM | POA: Diagnosis present

## 2024-01-13 NOTE — Telephone Encounter (Signed)
Pt chart reviewed and noted Result note as follows.  Loretha Stapler, RN 01/12/2024  8:32 AM EST     Dr Meridee Score please advise   Lynann Bologna, MD 01/11/2024  6:04 PM EST     Pt with gastric adenocarcinoma Seen Dr. Alexandria Lodge The recommend EUS. Not sure if [facility] has any openings.   Patty, pl advice. Send report to family physician

## 2024-01-14 ENCOUNTER — Inpatient Hospital Stay: Payer: No Typology Code available for payment source | Admitting: Nutrition

## 2024-01-14 ENCOUNTER — Other Ambulatory Visit: Payer: Self-pay | Admitting: *Deleted

## 2024-01-14 ENCOUNTER — Other Ambulatory Visit: Payer: Self-pay

## 2024-01-14 DIAGNOSIS — C169 Malignant neoplasm of stomach, unspecified: Secondary | ICD-10-CM

## 2024-01-14 NOTE — Progress Notes (Signed)
The proposed treatment discussed in conference is for discussion purpose only and is not a binding recommendation.  The patients have not been physically examined, or presented with their treatment options.  Therefore, final treatment plans cannot be decided.

## 2024-01-14 NOTE — Progress Notes (Signed)
58 yo male diagnosed with Gastric cancer and followed by Dr. Truett Perna.  PMH includes DVT, Covid, Kidney Stones and HTN.  Medications include Omeprazole.  Labs reviewed.  Height: 6 feet. Weight: 216 pounds 11.2 oz. UBW: 240 pounds per wife. Patient weighed 222 # 6 oz on Jan 7. BMI: 29.39.  Telephone consult completed with wife. She states patients complains of abdominal pain when he touches his stomach or if he eats too much or eats fried and spicy foods. He has early satiety. His appetite is poor. He has tried Ensure Complete but reports a stomach ache after drinking. 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 He drinks coffee and green smoothies.(Made out of green vegetables). He loves sweets but was told by family/friends it was bad for him.  Nutrition Diagnosis: Unintended wt loss related to gastric cancer and associated treatments as evidenced by 10% loss from usual body weight.  Intervention: Educated to try small, frequent meals and snacks every 2 hours throughout the day. Limit spicy and fried foods based on tolerance. Choose blander foods and baked or broiled foods. Add protein to green smoothies. Drink 4 - 5 oz of ONS at one time by slowly sipping it and not "chugging" it. Take protein bars or snacks on the road for dinner and snacks. Emailed nutrition fact sheets and recipes. Contact information given. Questions answered.  Monitoring, Evaluation, Goals: Patient will tolerate increased calories and protein to minimize loss of lean body mass. Strive for weight maintenance.  Next Visit: To be scheduled as needed.

## 2024-01-15 ENCOUNTER — Inpatient Hospital Stay: Payer: No Typology Code available for payment source | Admitting: Nurse Practitioner

## 2024-01-15 ENCOUNTER — Inpatient Hospital Stay: Payer: No Typology Code available for payment source

## 2024-01-15 ENCOUNTER — Encounter: Payer: Self-pay | Admitting: Nurse Practitioner

## 2024-01-15 VITALS — BP 127/75 | HR 60 | Temp 98.1°F | Resp 18 | Ht 72.0 in | Wt 214.6 lb

## 2024-01-15 DIAGNOSIS — C162 Malignant neoplasm of body of stomach: Secondary | ICD-10-CM

## 2024-01-15 DIAGNOSIS — R634 Abnormal weight loss: Secondary | ICD-10-CM | POA: Diagnosis not present

## 2024-01-15 DIAGNOSIS — Z803 Family history of malignant neoplasm of breast: Secondary | ICD-10-CM

## 2024-01-15 DIAGNOSIS — C169 Malignant neoplasm of stomach, unspecified: Secondary | ICD-10-CM | POA: Diagnosis present

## 2024-01-15 DIAGNOSIS — R6881 Early satiety: Secondary | ICD-10-CM | POA: Diagnosis not present

## 2024-01-15 DIAGNOSIS — G893 Neoplasm related pain (acute) (chronic): Secondary | ICD-10-CM | POA: Diagnosis not present

## 2024-01-15 DIAGNOSIS — Z86718 Personal history of other venous thrombosis and embolism: Secondary | ICD-10-CM | POA: Diagnosis not present

## 2024-01-15 LAB — CMP (CANCER CENTER ONLY)
ALT: 27 U/L (ref 0–44)
AST: 23 U/L (ref 15–41)
Albumin: 4.1 g/dL (ref 3.5–5.0)
Alkaline Phosphatase: 95 U/L (ref 38–126)
Anion gap: 9 (ref 5–15)
BUN: 14 mg/dL (ref 6–20)
CO2: 26 mmol/L (ref 22–32)
Calcium: 9.2 mg/dL (ref 8.9–10.3)
Chloride: 104 mmol/L (ref 98–111)
Creatinine: 1.02 mg/dL (ref 0.61–1.24)
GFR, Estimated: >60 mL/min (ref >60)
Glucose, Bld: 128 mg/dL — ABNORMAL HIGH (ref 70–99)
Potassium: 4.1 mmol/L (ref 3.5–5.1)
Sodium: 139 mmol/L (ref 135–145)
Total Bilirubin: 0.4 mg/dL (ref 0.0–1.2)
Total Protein: 7.5 g/dL (ref 6.5–8.1)

## 2024-01-15 LAB — CBC WITH DIFFERENTIAL (CANCER CENTER ONLY)
Abs Immature Granulocytes: 0.02 10*3/uL (ref 0.00–0.07)
Basophils Absolute: 0 10*3/uL (ref 0.0–0.1)
Basophils Relative: 1 %
Eosinophils Absolute: 0.3 10*3/uL (ref 0.0–0.5)
Eosinophils Relative: 3 %
HCT: 47.9 % (ref 39.0–52.0)
Hemoglobin: 16.1 g/dL (ref 13.0–17.0)
Immature Granulocytes: 0 %
Lymphocytes Relative: 19 %
Lymphs Abs: 1.6 10*3/uL (ref 0.7–4.0)
MCH: 30.7 pg (ref 26.0–34.0)
MCHC: 33.6 g/dL (ref 30.0–36.0)
MCV: 91.2 fL (ref 80.0–100.0)
Monocytes Absolute: 0.6 10*3/uL (ref 0.1–1.0)
Monocytes Relative: 7 %
Neutro Abs: 6.1 10*3/uL (ref 1.7–7.7)
Neutrophils Relative %: 70 %
Platelet Count: 226 10*3/uL (ref 150–400)
RBC: 5.25 MIL/uL (ref 4.22–5.81)
RDW: 12.8 % (ref 11.5–15.5)
WBC Count: 8.6 10*3/uL (ref 4.0–10.5)
nRBC: 0 % (ref 0.0–0.2)

## 2024-01-15 LAB — FERRITIN: Ferritin: 95 ng/mL (ref 24–336)

## 2024-01-15 LAB — CEA (ACCESS): CEA (CHCC): 3.57 ng/mL (ref 0.00–5.00)

## 2024-01-15 NOTE — Progress Notes (Signed)
[facility] Clinical Social Work  Initial Assessment   Henry May is a 58 y.o. year old male contacted caregiver by phone. Clinical Social Work was referred by new patient protocol for assessment of psychosocial needs.   SDOH (Social Determinants of Health) assessments performed: No   SDOH Screenings   Depression (PHQ2-9): Low Risk  (12/23/2023)  Social Connections: Unknown (04/30/2022)   [facility] from [facility]  Tobacco Use: Medium Risk (01/15/2024)     Distress Screen completed: No     No data to display            Family/Social Information:  Housing Arrangement: patient lives with her husband and son. Patient's two older daughters are in the military and fly home when they are able to.  Family members/support persons in your life? 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   Income source: Employment Financial concerns: Yes, current concerns due to loss of income during treatment.  Type of concern: Utilities, Rent/ mortgage, and Medical bills Food access concerns: no Religious or spiritual practice: Not known Advanced directives: Not known Services Currently in place:  Family, Community education officer, Transportation  Coping/ Adjustment to diagnosis: Patient understands treatment plan and what happens next? Patient and family will learn more about treatment and staging at upcoming provider appointment.  Concerns about diagnosis and/or treatment: Feelings of anger or sadness, How I will care for other members of my family, Losing my job and/or losing income, and Overwhelmed by information Patient reported stressors: Finances and Adjusting to my illness Hopes and/or priorities: To get through treatment and cover finances Current coping skills/ strengths: Ability for insight , Average or above average intelligence , Communication skills , General  fund of knowledge , Motivation for treatment/growth , and Supportive family/friends     SUMMARY: Current SDOH Barriers:  Financial constraints related to Treatment, Housing Cost, and Basic Needs.  Clinical Social Work Clinical Goal(s):  Scientist, research (life sciences) options for unmet needs related to:  Financial Strain   Interventions: Discussed common feeling and emotions when being diagnosed with cancer, and the importance of support during treatment Informed patient of the support team roles and support services at [facility] Provided CSW contact information and encouraged patient to call with any questions or concerns Provided patient with information about SSI / SSDI.  CSW can put referral into [employer]. Provided Patient with information about applying for Medicaid / SNAP Benefits.CSW messaged DSS representative with questions relating to timing of application.  Discussed private insurance and qualifying life experience (patient leaves his job) and the ability to find a new insurance plan if not approved for [employer].   Follow Up Plan: CSW will follow-up with patient by phone  Patient verbalizes understanding of plan: Yes   Marguerita Merles, LCSW Clinical Social Worker [facility]

## 2024-01-15 NOTE — Telephone Encounter (Signed)
Spoke with [facility] procedural scheduling & was provided fax number [phone]. They advised me to fax records & that a nurse will review and contact patient for scheduling. Left message for wife to call back.

## 2024-01-15 NOTE — Telephone Encounter (Signed)
Please set him up for EUS RE: gastric adenocarcinoma.  No distant mets on PET.  For staging  With Dr. Mishra/Dr Pawa at [facility] as soon as available  RG

## 2024-01-15 NOTE — Telephone Encounter (Signed)
Patient's wife requesting a call back for message below, states [facility] between 1:30PM-4:15PM

## 2024-01-15 NOTE — Progress Notes (Signed)
[facility] OFFICE PROGRESS NOTE   Diagnosis: Gastric cancer  INTERVAL HISTORY:   Mr. Schoffstall returns as scheduled.  Continued early satiety.  He is tolerating small portions.  He notes abdominal pain if he consumes too much.  No nausea.  He periodically regurgitates.  No bleeding.  No constipation or diarrhea.  Objective:  Vital signs in last 24 hours:  Blood pressure 127/75, pulse 60, temperature 98.1 F (36.7 C), temperature source Temporal, resp. rate 18, height 6' (1.829 m), weight 214 lb 9.6 oz (97.3 kg), SpO2 100%.   Resp: Lungs clear bilaterally. Cardio: Regular rate and rhythm. GI: No hepatomegaly.  Firm fullness with associated tenderness right mid abdomen. Vascular: No leg edema.   Lab Results:  Lab Results  Component Value Date   WBC 8.6 01/15/2024   HGB 16.1 01/15/2024   HCT 47.9 01/15/2024   MCV 91.2 01/15/2024   PLT 226 01/15/2024   NEUTROABS 6.1 01/15/2024    Imaging:  No results found.   Medications: I have reviewed the patient's current medications.  Assessment/Plan: Gastric cancer 11/21/2023 EGD-gastric body with "infiltrative looking lesion"; biopsy shows involvement of a hypercellular lesion that suggests diffuse carcinoma by morphology CTs abdomen/pelvis 11/28/2023-diffuse fatty infiltration of the liver; gastric wall thickening; lymph nodes up to 6 mm in diameter near the stomach wall. 12/31/2023 upper endoscopy-large diffuse friable infiltrative polypoid and ulcerated circumferential mass found in the gastric body.  Scope was passed beyond the mass with normal antrum/pylorus.  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  Negative for HER2 (1+); mismatch repair protein IHC normal; PD-L1 CPS 0% PET scan 01/07/2024-circumferential hypermetabolic gastric mucosal thickening.  Ill-defined hypermetabolic lymph nodes in the gastrohepatic ligament.  Intense  hypermetabolic activity associated with the right lobe of the thyroid gland.  Small hypermetabolic right axillary node favored reactive. Early satiety, postprandial abdominal pain, weight loss secondary to #1 History of bilateral lower extremity DVT 2021-anticoagulation discontinued due to massive retroperitoneal bleed, IVC filter placed 08/24/2020 Hospitalized with acute respiratory failure due to COVID-19 08/06/2020 - 09/04/2020 History of massive retroperitoneal bleed secondary to anticoagulation September 2021 Thyroid ultrasound 01/13/2024-no abnormal nodule identified in the right lobe.  1.1 cm thyroid isthmus nodule meets criteria for 1 year follow-up ultrasound.  Disposition: Mr. Mcconathy appears stable.  We reviewed the final PET scan report with him and his wife at today's visit and again reviewed the images.  They understand there appears to be hypermetabolic activity involving the stomach as well as lymph nodes in the gastrohepatic ligament and possibly a left sided peritoneal mass adjacent to the colon.  His case was presented at the GI tumor conference.  He is scheduled to see Dr. Freida Busman next week with the plan for a laparoscopy and Port-A-Cath placement.  He will return for follow-up in approximately 2 weeks to review laparoscopy findings and establish a treatment plan.  We are available to see him sooner if needed.  We reviewed the thyroid ultrasound result indicating no abnormal nodule in the right lobe.  Patient seen with Dr. Truett Perna.    Lonna Cobb ANP/GNP-BC   01/15/2024  3:42 PM  This was a shared visit with Lonna Cobb.  We reviewed the PET findings and images with Mr. Mcghie.  There are x-ray findings concerning for carcinomatosis.  His case was presented at the GI tumor conference 01/14/2024.  He will be referred to Dr. Freida Busman for Port-A-Cath placement and a diagnostic  laparoscopy.  Mismatch repair protein testing returned normal.  He is not a candidate for immunotherapy.  We will  recommend FOLFOX if the laparoscopy confirms metastatic disease.  He will return for an office visit and further discussion after the diagnostic laparoscopy.  I was present for greater than 50% of today's visit.  I performed Medical Decision Making.  Mancel Bale, MD

## 2024-01-16 ENCOUNTER — Telehealth: Payer: Self-pay

## 2024-01-16 NOTE — Telephone Encounter (Signed)
I have cancelled the case as ordered  Henry May see notes below

## 2024-01-16 NOTE — Telephone Encounter (Signed)
-----   Message from [facility] sent at 01/16/2024 12:30 PM EST ----- Regarding: RE: Recommend a staging EUS Loretta Kluender, This patient will not need EUS afterall. Please cancel any procedures. GM ----- Message ----- From: Lynann Bologna, MD Sent: 01/16/2024  12:07 PM EST To: Ardell Isaacs, RN; Emeline Darling, RN; # Subject: RE: Recommend a staging EUS                     Toniann Fail,  Thanks for letting me know. I will loop in Dr Meridee Score as well. I did call [facility] day before yesterday to see if they have any openings for EUS. Sorry I missed the conference.  RG ----- Message ----- From: Ardell Isaacs, RN Sent: 01/15/2024   8:15 AM EST To: Emeline Darling, RN; Lynann Bologna, MD Subject: RE: Recommend a staging EUS                    Dr Chales Abrahams,  After GI conference discussion yesterday the patient is going to have a diagnostic lap with Dr Freida Busman to explore his abdominal cavity findings  Thank you for your help with this patient  Toniann Fail ----- Message ----- From: Lynann Bologna, MD Sent: 01/14/2024   9:41 PM EST To: Ardell Isaacs, RN Subject: RE: Recommend a staging EUS                    Will arrange EUS Elby Showers ----- Message ----- From: Ardell Isaacs, RN Sent: 01/09/2024   8:54 AM EST To: Emeline Darling, RN; Lynann Bologna, MD Subject: Recommend a staging EUS                        Dr Chales Abrahams,  Dr Truett Perna met with Mr Lynch and family yesterday afternoon and in review of PET scan results recommends a staging  EUS, can that be arranged with your office?  Please let me know if there is anything you need from me  Thank you Laurena Spies Drawbridge Nurse Navigator [phone]

## 2024-01-16 NOTE — Telephone Encounter (Signed)
 Left message for patient's wife to call back.

## 2024-01-16 NOTE — Telephone Encounter (Signed)
Spoke with patient's wife regarding referral to [facility] for EUS. She stated patient is already scheduled for one with Dr. Meridee Score on 03/04/24. Reviewed chart & pt is already scheduled. Advised her that if [facility] has sooner opening then to take sooner date. She has been given there number & advised she reach out early next week if she has not heard from their office.

## 2024-01-19 ENCOUNTER — Other Ambulatory Visit: Payer: Self-pay | Admitting: *Deleted

## 2024-01-19 DIAGNOSIS — C162 Malignant neoplasm of body of stomach: Secondary | ICD-10-CM

## 2024-01-19 NOTE — Progress Notes (Signed)
Oley Balm, MD  Claudean Kinds PROCEDURE / BIOPSY REVIEW Date: 01/19/24  Requested Biopsy site: L paracolic gutter mass Reason for request: see below Imaging review: Best seen on CT and PET  Decision: Approved Imaging modality to perform: CT Schedule with: Moderate Sedation Schedule for: Any VIR  Additional comments:   Please contact me with questions, concerns, or if issue pertaining to this request arise.  Dayne Oley Balm, MD [facility]       Previous Messages    ----- Message ----- From: Ladene Artist, MD Sent: 01/19/2024  12:38 PM EST To: Oley Balm, MD Subject: RE: CT ABDOMINAL MASS BIOPSY                  We presented her at conference last week, there appears to be omental nodularity anteriorly, and there also appeared to be a lesion at the left paracolic gutter, these appeared amenable to biopsy based on the conference discussion  She also needs a Port-A-Cath  Let me know if you agree and I can place a biopsy and Port-A-Cath order  Thanks,  Brad ----- Message ----- From: Oley Balm, MD Sent: 01/19/2024  11:33 AM EST To: Ladene Artist, MD; Claudean Kinds Subject: FW: CT ABDOMINAL MASS BIOPSY                  Brad:  I saw your colleague's note :  PATIENT NAVIGATOR PROGRESS NOTE  Name: Hayzen Lorenson Date: 01/19/2024 MRN: [id number]       DOB: 02-21-1966  Reason for visit: Biopsy and port placement  Comments:  Received call from patient's wife that surgeon's office does not accept his insurance and have canceled appt for tomorrow.  After discussion with Dr Truett Perna orders placed for IR biopsy of peritoneal biopsy or omentum and IR port placement  Called and spoke with wife and explained new plan and will call her with updates as he is scheduled  but PETCT show no accessible hypermetabolic tumor:     PET IMPRESSION: 1. Circumferential hypermetabolic gastric mucosal  thickening consistent with primary gastric adenocarcinoma. 2. Ill-defined hypermetabolic lymph nodes in the gastrohepatic ligament concerning for with local nodal metastasis. 3. No evidence of liver metastasis. 4. Intense hypermetabolic activity associated with the RIGHT lobe of thyroid gland. Favor primary thyroid neoplasm.. Recommend ultrasound of the thyroid gland and fine needle aspiration. 5. Small hypermetabolic RIGHT axillary lymph node is favored reactive.  Please let me know how you want [place] to proceed.  Thanks Reuel Boom ----- Message ----- From: Claudean Kinds Sent: 01/19/2024  10:47 AM EST To: Claudean Kinds; Ir Procedure Requests Subject: CT ABDOMINAL MASS BIOPSY                      Procedure : CT ABDOMINAL MASS BIOPSY  Reason : for staging Dx: Malignant neoplasm of body of stomach (HCC) [C16.2 (ICD-10-CM)]  History:  US thyroid , NM Pet skull base to thigh , CT abd pelv in PACS  Provider : Ladene Artist, MD  Provider contact : [phone]

## 2024-01-19 NOTE — Progress Notes (Unsigned)
PATIENT NAVIGATOR PROGRESS NOTE  Name: Henry May Date: 01/19/2024 MRN: [id number]  DOB: 10-04-1966   Reason for visit:  Biopsy and port placement  Comments:  Received call from patient's wife that surgeon's office does not accept his insurance and have canceled appt for tomorrow.  After discussion with Dr Truett Perna orders placed for IR biopsy of peritoneal biopsy or omentum and IR port placement  Called and spoke with wife and explained new plan and will call her with updates as he is scheduled    Time spent counseling/coordinating care: 30-45 minutes

## 2024-01-20 ENCOUNTER — Telehealth: Payer: Self-pay | Admitting: Genetic Counselor

## 2024-01-20 NOTE — Telephone Encounter (Signed)
 Henry May

## 2024-01-21 ENCOUNTER — Other Ambulatory Visit: Payer: Self-pay | Admitting: Radiology

## 2024-01-21 DIAGNOSIS — C169 Malignant neoplasm of stomach, unspecified: Secondary | ICD-10-CM

## 2024-01-21 NOTE — H&P (Signed)
 Chief Complaint: Patient was seen in consultation today for gastric adenocarcinoma   Procedure: Abdominal Mass biopsy and port-a-cath placement  Referring Physician(s): Sherrill,Gary B  Supervising Physician: Philip Cornet  Patient Status: [facility] - Out-pt  History of Present Illness: Henry May is a 57 y.o. male with a history of HTN and DVT (IVC filter placed in 2021) with a recent diagnosis of gastric cancer. Patient was reportedly found to have a gastric mass on an EGD in [place] on 11/21/23 indicating an 'infiltrative looking lesion' involving the gastric body. Biopsy showed involvement by hypercellular lesion suggesting diffuse carcinoma by morphology. CT on 11/28/23 significant for gastric wall thickening, potentially related to neoplasia as suspected in earlier endoscopy. He was referred to GI when he returned and underwent another endoscopy on 12/31/23 which was significant for 'a large diffuse friable infiltrative polypoid and ulcerated circumferential mass was found in the gastric body with mild stenosis distal body.  The mass came within 2 cm of the GE junction.' Biopsy of the mass showed poorly differentiated adenocarcinoma with signet ring cells.  PET scan on 01/07/24 significant for:  IMPRESSION: 1. Circumferential hypermetabolic gastric mucosal thickening consistent with primary gastric adenocarcinoma. 2. Ill-defined hypermetabolic lymph nodes in the gastrohepatic ligament concerning for with local nodal metastasis. 3. No evidence of liver metastasis. 4. Intense hypermetabolic activity associated with the RIGHT lobe of thyroid  gland. Favor primary thyroid  neoplasm. Recommend ultrasound of the thyroid  gland and fine needle aspiration. 5. Small hypermetabolic RIGHT axillary lymph node is favored reactive.  Patient originally planned for EUS, however d/t office not accepting his insurance, patient was referred to IR for peritoneal/omental biopsy and port  placement.  Patient currently resting in bed with family at bedside. Admits to some worsening abdominal pain, particularly with sneezing and coughing. Otherwise he has no complaints. He is not on any blood thinners and has been NPO since midnight. VSS. Afebrile. INR 1.0.   Code Status: Full code  Past Medical History:  Diagnosis Date   Acute respiratory failure (HCC) 08/06/2020   Chronic cough    SINCE 08-06-2020 COVID PNEUMONIA   COVID 08/06/2020   Dvt femoral (deep venous thrombosis) (HCC) 08/2020   LEFT LEG    History of blood transfusion 08/2020   AFTER MI 5 UNITS GIVEN   History of kidney stones    Hypertension    Numbness    LEFT LEG AT TIMES   Pneumonia 08/06/2020   COVID PNEUMONIA    Past Surgical History:  Procedure Laterality Date   CYSTOSCOPY WITH STENT PLACEMENT Left 10/06/2020   Procedure: CYSTOSCOPY WITH STENT PLACEMENT;  Surgeon: Devere Lonni Righter, MD;  Location: [facility];  Service: Urology;  Laterality: Left;   CYSTOSCOPY/URETEROSCOPY/HOLMIUM LASER/STENT PLACEMENT Left 11/14/2020   Procedure: CYSTOSCOPY/URETEROSCOPY/HOLMIUM LASER/STENT PLACEMENT;  Surgeon: Devere Lonni Righter, MD;  Location: [facility];  Service: Urology;  Laterality: Left;  ONLY NEEDS 45 MIN   IR IVC FILTER PLMT / S&I /IMG GUID/MOD SED  08/24/2020    Allergies: Patient has no known allergies.  Medications: Prior to Admission medications   Medication Sig Start Date End Date Taking? Authorizing Provider  omeprazole  (PRILOSEC) 20 MG capsule Take 20 mg by mouth every 12 (twelve) hours.    [provider]     Family History  Problem Relation Age of Onset   Cancer Mother        type unknown   Diabetes Mellitus II Neg Hx     Social History   Socioeconomic History   Marital  status: Married    Spouse name: Not on file   Number of children: 3   Years of education: Not on file   Highest education level: Not on file  Occupational History   Not on file  Tobacco Use    Smoking status: Former    Current packs/day: 0.00    Types: Cigarettes    Quit date: 35    Years since quitting: 39.1    Passive exposure: Never   Smokeless tobacco: Never  Vaping Use   Vaping status: Never Used  Substance and Sexual Activity   Alcohol use: Yes    Comment: occasional   Drug use: Never   Sexual activity: Yes  Other Topics Concern   Not on file  Social History Narrative   Not on file   Social Drivers of Health   Financial Resource Strain: Not on file  Food Insecurity: Not on file  Transportation Needs: Not on file  Physical Activity: Not on file  Stress: Not on file  Social Connections: Unknown (04/30/2022)   Received from [facility]   Social Network    Social Network: Not on file    Review of Systems  Gastrointestinal:  Positive for abdominal pain.  Denies any N/V, chest pain, shortness of breath, fevers/chills. All other ROS negative.  Vital Signs:    01/22/2024    8:03 AM 01/15/2024    1:58 PM 01/08/2024    1:46 PM  Vitals with BMI  Height  6' 0 6' 0  Weight  214 lbs 10 oz 216 lbs 11 oz  BMI  29.1 29.38  Systolic 137 127 861  Diastolic 93 75 81  Pulse 64 60 71    Physical Exam Vitals reviewed.  Constitutional:      Appearance: Normal appearance.  HENT:     Head: Normocephalic and atraumatic.     Mouth/Throat:     Mouth: Mucous membranes are moist.     Pharynx: Oropharynx is clear.  Cardiovascular:     Rate and Rhythm: Normal rate and regular rhythm.  Pulmonary:     Effort: Pulmonary effort is normal.  Abdominal:     General: Abdomen is flat.     Palpations: Abdomen is soft.     Tenderness: There is abdominal tenderness (diffuse).  Musculoskeletal:        General: Normal range of motion.     Cervical back: Normal range of motion.  Skin:    General: Skin is warm and dry.  Neurological:     General: No focal deficit present.     Mental Status: He is alert and oriented to person, place, and time. Mental status  is at baseline.  Psychiatric:        Mood and Affect: Mood normal.        Behavior: Behavior normal.        Judgment: Judgment normal.     Imaging: US  THYROID  Result Date: 01/15/2024 CLINICAL DATA:  Incidental on PET. Increased metabolic activity by PET throughout much of the right lobe and possibly extending posterior to the right lobe. EXAM: THYROID  ULTRASOUND TECHNIQUE: Ultrasound examination of the thyroid  gland and adjacent soft tissues was performed. COMPARISON:  PET scan on 01/07/2024 FINDINGS: Parenchymal Echotexture: Normal Isthmus: 0.7 cm Right lobe: 5.1 x 1.8 x 2.4 cm Left lobe: 4.0 x 1.4 x 1.2 cm _________________________________________________________ Estimated total number of nodules >/= 1 cm: 1 Number of spongiform nodules >/=  2 cm not described below (TR1): 0 Number of  mixed cystic and solid nodules >/= 1.5 cm not described below (TR2): 0 _________________________________________________________ Nodule # 1: Location: Isthmus Maximum size: 1.1 cm; Other 2 dimensions: 1.1 x 0.7 cm Composition: solid/almost completely solid (2) Echogenicity: hypoechoic (2) Shape: not taller-than-wide (0) Margins: smooth (0) Echogenic foci: none (0) ACR TI-RADS total points: 4. ACR TI-RADS risk category: TR4 (4-6 points). ACR TI-RADS recommendations: *Given size (>/= 1 - 1.4 cm) and appearance, a follow-up ultrasound in 1 year should be considered based on TI-RADS criteria. _________________________________________________________ No abnormal nodule is identified in the right lobe. There is no identifiable abnormal parathyroid soft tissue nodules. No enlarged or abnormal appearing lymph nodes are identified. IMPRESSION: 1. 1.1 cm thyroid  isthmus nodule meets criteria for 1 year follow-up ultrasound. 2. No sonographic correlate is identified for the increased metabolic activity within the right lobe or potentially extending posterior to the right lobe. There is no evidence of an obvious parathyroid adenoma by  ultrasound. The above is in keeping with the ACR TI-RADS recommendations - J Am Coll Radiol 2017;14:587-595. Electronically Signed   By: Marcey Moan M.D.   On: 01/15/2024 13:36   NM PET Image Initial (PI) Skull Base To Thigh (F-18 FDG) Result Date: 01/08/2024 CLINICAL DATA:  Malignant gastric tumor of the stomach. Initial treatment strategy. EXAM: NUCLEAR MEDICINE PET SKULL BASE TO THIGH TECHNIQUE: 10.8 mCi F-18 FDG was injected intravenously. Full-ring PET imaging was performed from the skull base to thigh after the radiotracer. CT data was obtained and used for attenuation correction and anatomic localization. Fasting blood glucose: 113 mg/dl COMPARISON:  Outside CT 11/28/2023 FINDINGS: NECK: Intense hypermetabolic activity localizes to the RIGHT lobe of thyroid  gland with SUV max equal 7.7 on image 52. There is posterior nodular extension of the RIGHT lobe of the gland with a 15 mm nodule adjacent to the esophagus with equal intensity radiotracer activity Incidental CT findings: None. CHEST: Moderate radiotracer activity associated small RIGHT axillary lymph node with max SUV max equal 4.0 on image 77. No paraesophageal metabolic adenopathy. No suspicious pulmonary nodules. Incidental CT findings: Mild chronic interstitial lung disease noted. ABDOMEN/PELVIS: There circumferential gastric mucosal thickening involving the gastric body and antrum. This circumferential thickening is diffusely hypermetabolic SUV max equal 7.0 on image 101 for example. There is ill-defined hypermetabolic lymph nodes in the gastrohepatic ligament with SUV max equal 6.8 on image 105. No discrete measurable nodes. There is haziness within the gastrohepatic ligament peritoneal fat (image 105/4). No abnormal metabolic activity in the liver. Hypermetabolic abdominopelvic lymph nodes. Incidental CT findings: . Uterus and adnexa unremarkable. Infrarenal IVC filter in place. SKELETON: No focal hypermetabolic activity to suggest skeletal  metastasis. Incidental CT findings: None. IMPRESSION: 1. Circumferential hypermetabolic gastric mucosal thickening consistent with primary gastric adenocarcinoma. 2. Ill-defined hypermetabolic lymph nodes in the gastrohepatic ligament concerning for with local nodal metastasis. 3. No evidence of liver metastasis. 4. Intense hypermetabolic activity associated with the RIGHT lobe of thyroid  gland. Favor primary thyroid  neoplasm. Recommend ultrasound of the thyroid  gland and fine needle aspiration. 5. Small hypermetabolic RIGHT axillary lymph node is favored reactive. Electronically Signed   By: Jackquline Boxer M.D.   On: 01/08/2024 16:20    Labs:  CBC: Recent Labs    12/29/23 1618 01/15/24 1342 01/22/24 0750  WBC 8.3 8.6 9.8  HGB 16.0 16.1 17.6*  HCT 48.9 47.9 52.0  PLT 214.0 226 287    COAGS: Recent Labs    12/29/23 1618 01/22/24 0750  INR 1.2* 1.0    BMP: Recent  Labs    12/29/23 1618 01/15/24 1342  NA 139 139  K 4.0 4.1  CL 104 104  CO2 26 26  GLUCOSE 90 128*  BUN 15 14  CALCIUM  9.2 9.2  CREATININE 0.99 1.02  GFRNONAA  --  >60    LIVER FUNCTION TESTS: Recent Labs    12/29/23 1618 01/15/24 1342  BILITOT 0.3 0.4  AST 21 23  ALT 29 27  ALKPHOS 89 95  PROT 7.4 7.5  ALBUMIN  4.2 4.1    TUMOR MARKERS: Recent Labs    01/15/24 1342  CEA 3.57    Assessment and Plan:  58 y.o. male with a history of HTN and DVT (IVC filter placed in 2021) with a recent diagnosis of gastric cancer. Patient was reportedly found to have a gastric mass on an EGD in [place] on 11/21/23 indicating an 'infiltrative looking lesion' involving the gastric body. Biopsy showed involvement by hypercellular lesion suggesting diffuse carcinoma by morphology. CT on 11/28/23 significant for gastric wall thickening, potentially related to neoplasia as suspected in earlier endoscopy. He was referred to GI when he returned and underwent another endoscopy on 12/31/23 which was significant for 'a large  diffuse friable infiltrative polypoid and ulcerated circumferential mass was found in the gastric body with mild stenosis distal body.  The mass came within 2 cm of the GE junction.' Biopsy of the mass showed poorly differentiated adenocarcinoma with signet ring cells.  PET scan on 01/07/24 significant for:  IMPRESSION: 1. Circumferential hypermetabolic gastric mucosal thickening consistent with primary gastric adenocarcinoma. 2. Ill-defined hypermetabolic lymph nodes in the gastrohepatic ligament concerning for with local nodal metastasis. 3. No evidence of liver metastasis. 4. Intense hypermetabolic activity associated with the RIGHT lobe of thyroid  gland. Favor primary thyroid  neoplasm. Recommend ultrasound of the thyroid  gland and fine needle aspiration. 5. Small hypermetabolic RIGHT axillary lymph node is favored reactive.  Patient originally planned for EUS, however d/t office not accepting his insurance, patient was referred to IR for peritoneal/omental biopsy and port placement.  Plan for possible omental/peritoneal biopsy and port-a-catheter placement on 01/22/24 with Dr. DELENA Balder  Risks and benefits of peritoneal/omental biopsy was discussed with the patient and/or patient's family including, but not limited to bleeding, infection, damage to adjacent structures or low yield requiring additional tests.  Risks and benefits of image guided port-a-catheter placement was discussed with the patient including, but not limited to bleeding, infection, pneumothorax, or fibrin sheath development and need for additional procedures.  All of the patient's questions were answered, patient is agreeable to proceed. Consent signed and in chart.   Thank you for this interesting consult. I greatly enjoyed meeting Henry May and look forward to participating in their care. A copy of this report was sent to the requesting provider on this date.  Electronically Signed: Mardell Cragg M Luke Falero,  PA-C 01/22/2024, 8:29 AM   I spent a total of  30 Minutes in face to face clinical consultation, greater than 50% of which was counseling/coordinating care for possible omental/peritoneal biopsy and port-a-catheter placement.

## 2024-01-21 NOTE — Progress Notes (Signed)
 Patient for CT guided Abd Mass biopsy & IR Port Placement on Thurs 01/22/24, I called and spoke with the patient's wife, Johana on the phone and gave pre-procedure instructions. Johana was made aware to have the patient here at 7:30a, NPO after MN prior to procedure as well as driver post procedure/recovery/discharge. Merl stated understanding.  Called 01/21/2024

## 2024-01-22 ENCOUNTER — Ambulatory Visit
Admission: RE | Admit: 2024-01-22 | Discharge: 2024-01-22 | Disposition: A | Discharge status: Home / Self Care | Payer: No Typology Code available for payment source | Source: Ambulatory Visit | Attending: Oncology | Admitting: Oncology

## 2024-01-22 ENCOUNTER — Ambulatory Visit
Admission: RE | Admit: 2024-01-22 | Discharge: 2024-01-22 | Disposition: A | Discharge status: Home / Self Care | Payer: No Typology Code available for payment source | Source: Ambulatory Visit | Attending: Diagnostic Radiology | Admitting: Diagnostic Radiology

## 2024-01-22 ENCOUNTER — Other Ambulatory Visit: Payer: Self-pay | Admitting: Diagnostic Radiology

## 2024-01-22 ENCOUNTER — Other Ambulatory Visit: Payer: Self-pay | Admitting: Oncology

## 2024-01-22 ENCOUNTER — Other Ambulatory Visit: Payer: Self-pay

## 2024-01-22 DIAGNOSIS — R188 Other ascites: Secondary | ICD-10-CM

## 2024-01-22 DIAGNOSIS — Z85028 Personal history of other malignant neoplasm of stomach: Secondary | ICD-10-CM | POA: Insufficient documentation

## 2024-01-22 DIAGNOSIS — I10 Essential (primary) hypertension: Secondary | ICD-10-CM | POA: Diagnosis not present

## 2024-01-22 DIAGNOSIS — Z452 Encounter for adjustment and management of vascular access device: Secondary | ICD-10-CM | POA: Insufficient documentation

## 2024-01-22 DIAGNOSIS — C162 Malignant neoplasm of body of stomach: Secondary | ICD-10-CM

## 2024-01-22 DIAGNOSIS — C169 Malignant neoplasm of stomach, unspecified: Secondary | ICD-10-CM | POA: Diagnosis present

## 2024-01-22 HISTORY — PX: IR PARACENTESIS: IMG2679

## 2024-01-22 HISTORY — PX: IR IMAGING GUIDED PORT INSERTION: IMG5740

## 2024-01-22 LAB — PROTIME-INR
INR: 1 (ref 0.8–1.2)
Prothrombin Time: 13.7 s (ref 11.4–15.2)

## 2024-01-22 LAB — CBC
HCT: 52 % (ref 39.0–52.0)
Hemoglobin: 17.6 g/dL — ABNORMAL HIGH (ref 13.0–17.0)
MCH: 30.4 pg (ref 26.0–34.0)
MCHC: 33.8 g/dL (ref 30.0–36.0)
MCV: 90 fL (ref 80.0–100.0)
Platelets: 287 10*3/uL (ref 150–400)
RBC: 5.78 MIL/uL (ref 4.22–5.81)
RDW: 12.4 % (ref 11.5–15.5)
WBC: 9.8 10*3/uL (ref 4.0–10.5)
nRBC: 0 % (ref 0.0–0.2)

## 2024-01-22 MED ORDER — FENTANYL CITRATE (PF) 100 MCG/2ML IJ SOLN
INTRAMUSCULAR | Status: AC | PRN
  Administered 2024-01-22: 25 ug via INTRAVENOUS
  Administered 2024-01-22: 50 ug via INTRAVENOUS

## 2024-01-22 MED ORDER — LIDOCAINE 1 % OPTIME INJ - NO CHARGE
10.0000 mL | Freq: Once | INTRAMUSCULAR | Status: DC
  Filled 2024-01-22: qty 10

## 2024-01-22 MED ORDER — LIDOCAINE HCL 1 % IJ SOLN
6.0000 mL | Freq: Once | INTRAMUSCULAR | Status: AC
  Administered 2024-01-22: 6 mL via INTRADERMAL

## 2024-01-22 MED ORDER — LIDOCAINE HCL 1 % IJ SOLN
INTRAMUSCULAR | Status: AC
  Filled 2024-01-22: qty 20

## 2024-01-22 MED ORDER — LIDOCAINE-EPINEPHRINE 1 %-1:100000 IJ SOLN
10.0000 mL | Freq: Once | INTRAMUSCULAR | Status: AC
  Administered 2024-01-22: 10 mL via INTRADERMAL

## 2024-01-22 MED ORDER — FENTANYL CITRATE (PF) 100 MCG/2ML IJ SOLN
INTRAMUSCULAR | Status: AC
  Filled 2024-01-22: qty 2

## 2024-01-22 MED ORDER — MIDAZOLAM HCL 5 MG/5ML IJ SOLN
INTRAMUSCULAR | Status: AC | PRN
  Administered 2024-01-22: 1 mg via INTRAVENOUS

## 2024-01-22 MED ORDER — SODIUM CHLORIDE 0.9 % IV SOLN: INTRAVENOUS | Status: DC

## 2024-01-22 MED ORDER — HEPARIN SOD (PORK) LOCK FLUSH 100 UNIT/ML IV SOLN
500.0000 [IU] | Freq: Once | INTRAVENOUS | Status: AC
  Administered 2024-01-22: 500 [IU] via INTRAVENOUS

## 2024-01-22 MED ORDER — HEPARIN SOD (PORK) LOCK FLUSH 100 UNIT/ML IV SOLN
INTRAVENOUS | Status: AC
  Filled 2024-01-22: qty 5

## 2024-01-22 MED ORDER — LIDOCAINE HCL 1 % IJ SOLN
13.0000 mL | Freq: Once | INTRAMUSCULAR | Status: AC
  Administered 2024-01-22: 13 mL via INTRADERMAL

## 2024-01-22 MED ORDER — MIDAZOLAM HCL 2 MG/2ML IJ SOLN
INTRAMUSCULAR | Status: AC
  Filled 2024-01-22: qty 2

## 2024-01-22 MED ORDER — MIDAZOLAM HCL 2 MG/2ML IJ SOLN
INTRAMUSCULAR | Status: AC | PRN
  Administered 2024-01-22: 1 mg via INTRAVENOUS

## 2024-01-22 MED ORDER — LIDOCAINE-EPINEPHRINE 1 %-1:100000 IJ SOLN
INTRAMUSCULAR | Status: AC
  Filled 2024-01-22: qty 1

## 2024-01-22 NOTE — Procedures (Signed)
 Interventional Radiology Procedure:   Indications: Gastric cancer.  Needs durable venous access and biopsy for staging  Procedure: 1) CT guided omental / peritoneal biopsy 2) Port placement 3) US  guided paracentesis  Findings: CT demonstrated increased ascites with omental / peritoneal disease.  Core biopsies obtained from omental thickening in left anterior abdomen.  Right jugular port, tip at SVC/RA junction.  US  guided paracentesis of perihepatic ascites.    Complications: None     EBL: Minimal, less than 10 ml  Plan: Discharge in one hour.  Keep port site and incisions dry for at least 24 hours.     Henry Boyd R. Philip, MD  Pager: [phone]

## 2024-01-22 NOTE — Progress Notes (Signed)
 Patient clinically stable post CT abdominal mass biopsy as well as IR port placement per Dr Philip, tolerated well. Paracentesis done post procedures with 2900 ml serous yellow fluid. Received Versed  2 mg along with 150 mcg IV fentanyl  for procedures. Report given to [facility] RN post procedure/specials./24.

## 2024-01-23 LAB — SURGICAL PATHOLOGY

## 2024-01-26 ENCOUNTER — Telehealth: Payer: Self-pay | Admitting: Gastroenterology

## 2024-01-26 ENCOUNTER — Encounter: Payer: Self-pay | Admitting: *Deleted

## 2024-01-26 ENCOUNTER — Other Ambulatory Visit: Payer: Self-pay | Admitting: *Deleted

## 2024-01-26 ENCOUNTER — Other Ambulatory Visit: Payer: Self-pay | Admitting: Oncology

## 2024-01-26 ENCOUNTER — Ambulatory Visit (HOSPITAL_COMMUNITY)
Admission: RE | Admit: 2024-01-26 | Discharge: 2024-01-26 | Disposition: A | Discharge status: Home / Self Care | Payer: No Typology Code available for payment source | Source: Ambulatory Visit | Attending: Oncology | Admitting: Oncology

## 2024-01-26 DIAGNOSIS — C169 Malignant neoplasm of stomach, unspecified: Secondary | ICD-10-CM | POA: Insufficient documentation

## 2024-01-26 DIAGNOSIS — C162 Malignant neoplasm of body of stomach: Secondary | ICD-10-CM

## 2024-01-26 DIAGNOSIS — R180 Malignant ascites: Secondary | ICD-10-CM | POA: Diagnosis not present

## 2024-01-26 LAB — CYTOLOGY - NON PAP

## 2024-01-26 MED ORDER — LIDOCAINE HCL 1 % IJ SOLN
INTRAMUSCULAR | Status: AC
  Filled 2024-01-26: qty 20

## 2024-01-26 NOTE — Telephone Encounter (Signed)
Left message for pt wife to call back

## 2024-01-26 NOTE — Progress Notes (Signed)
 START ON PATHWAY REGIMEN - Gastroesophageal     A cycle is every 14 days:     Oxaliplatin       Leucovorin       Fluorouracil       Fluorouracil    **Always confirm dose/schedule in your pharmacy ordering system**  Patient Characteristics: Distant Metastases (cM1/pM1) / Locally Recurrent Disease, Adenocarcinoma - Esophageal, GE Junction, and Gastric, First Line, HER2 Negative/Unknown, PD?L1 Expression CPS < 5/Negative/Unknown, MSS/pMMR or MSI Unknown, CLDN18.2 Negative/Unknown Therapeutic Status: Distant Metastases (No Additional Staging) Histology: Adenocarcinoma Disease Classification: Gastric Line of Therapy: First Line HER2 Status: Negative PD-L1 Expression Status: PD-L1 Expression CPS < 5 Microsatellite/Mismatch Repair Status: MSS/pMMR CLDN18.2 Status: Did Not Order Test Intent of Therapy: Non-Curative / Palliative Intent, Discussed with Patient

## 2024-01-26 NOTE — Telephone Encounter (Signed)
 Patient wife called and stated that her husband had a biopsy done last week and they had drained fluid from his abdomin. Patient wife stated that he has fluid again and she does not know where to take him. Patient wife is requesting a call back. Please advise.

## 2024-01-26 NOTE — Progress Notes (Signed)
 PATIENT NAVIGATOR PROGRESS NOTE  Name: Henry May Date: 01/26/2024 MRN: [id number]  DOB: 24-Sep-1966   Reason for visit:  Patient's wife called and stated that last night pt was very uncomfortable and that the fluid in his abdomen has returned.  We discussed upcoming appt tomorrow that will be to discuss treatment and to begin treatment this week pending authorization.    Comments:   Called [facility] and pt is scheduled to go to [facility] today at 11:00 for therapeutic paracentesis  Family verbalized understanding    Time spent counseling/coordinating care: > 60 minutes

## 2024-01-26 NOTE — Procedures (Signed)
 Ultrasound-guided diagnostic and therapeutic paracentesis performed yielding 3.1 liters of slightly hazy, yellow  fluid. No immediate complications. EBL none. A portion of the fluid was sent to the lab for cytology.

## 2024-01-27 ENCOUNTER — Inpatient Hospital Stay: Payer: No Typology Code available for payment source

## 2024-01-27 ENCOUNTER — Other Ambulatory Visit: Payer: Self-pay

## 2024-01-27 ENCOUNTER — Inpatient Hospital Stay: Payer: No Typology Code available for payment source | Attending: Nurse Practitioner | Admitting: Nurse Practitioner

## 2024-01-27 ENCOUNTER — Encounter: Payer: Self-pay | Admitting: Nurse Practitioner

## 2024-01-27 VITALS — BP 128/91 | HR 93 | Temp 98.1°F | Resp 18 | Ht 72.0 in | Wt 200.4 lb

## 2024-01-27 DIAGNOSIS — R112 Nausea with vomiting, unspecified: Secondary | ICD-10-CM | POA: Diagnosis not present

## 2024-01-27 DIAGNOSIS — C169 Malignant neoplasm of stomach, unspecified: Secondary | ICD-10-CM | POA: Diagnosis not present

## 2024-01-27 DIAGNOSIS — R531 Weakness: Secondary | ICD-10-CM | POA: Diagnosis not present

## 2024-01-27 DIAGNOSIS — R630 Anorexia: Secondary | ICD-10-CM | POA: Diagnosis not present

## 2024-01-27 DIAGNOSIS — R410 Disorientation, unspecified: Secondary | ICD-10-CM | POA: Diagnosis not present

## 2024-01-27 MED ORDER — ONDANSETRON HCL 8 MG PO TABS: 8.0000 mg | ORAL_TABLET | Freq: Three times a day (TID) | ORAL | 1 refills | Status: DC | PRN

## 2024-01-27 MED ORDER — PROCHLORPERAZINE MALEATE 10 MG PO TABS: 10.0000 mg | ORAL_TABLET | Freq: Four times a day (QID) | ORAL | 1 refills | Status: DC | PRN

## 2024-01-27 MED ORDER — TRAMADOL HCL 50 MG PO TABS: 50.0000 mg | ORAL_TABLET | Freq: Three times a day (TID) | ORAL | 0 refills | Status: DC | PRN

## 2024-01-27 MED ORDER — LIDOCAINE-PRILOCAINE 2.5-2.5 % EX CREA: 1.0000 | TOPICAL_CREAM | CUTANEOUS | 3 refills | Status: AC

## 2024-01-27 NOTE — Progress Notes (Signed)
Henry May presents for chemotherapy education session with his wife, Henry May and daughter.  Reviewed treatment plan in detail, including premeds, chemotherapy drugs, infusion time, frequency of administration and cycles. Treatment goal is contol. Explained how chemotherapy works and basis for side effects. Explained to call office for fever > 100.5; chills; s/s of infection; uncontrolled N/V or diarrhea; unusual shortness of breath or cough; unexplained bleeding or bruising and how to reach office during and after business hours. Provided chemotherapy alert card and encouraged patient to keep it in wallet and present at any ER visit. Was shown Port-a-Cath; how it is accessed and usage for infusion, blood draws and CT scan; and s/s of DVT or infection. Explained EMLA cream usage (may start 14 days after port placed). Provided handout and reviewed common potential side effects of chemotherapy and management, such as fatigue, N/V, diarrhea/constipation, mouth sores, peripheral neuropathy, palmar plantar erythrodysesthesia, neutropenia, thrombocytopenia, and anemia. Reviewed specific side effects of FOLLFOX regimen and management. Reviewed home anti-emetics and confirmed pharmacy to send in prescriptions. Shown CADD infusion pump and explained how to manage at home and phone number to call for pump issues after clinic hours. Provided pamphlet on "Safe Handling of Chemotherapy in the Home" and reviewed. Was provided booklet "Eating Hints: Before, During and After Cancer Treatment" and reviewed how to locate topics. Reviewed checklist for preparing for 1st treatment. Suggested he bring snacks, blanket, and something to pass time while in the treatment area. Made aware of visitor policy in the treatment area. Informed patient of resources available, including dietician, CSW, chaplain, financial advocate, and support groups/activities. Printed schedule and reviewed with patient/family. All questions were answered to  patient's satisfaction and a tour of the infusion area was given.   Face-to-Face:   1.5 hours

## 2024-01-27 NOTE — Progress Notes (Signed)
[facility] CSW Progress Note  Visual merchandiser met with patient and family on this date as scheduled. CSW assessed pyschosocial needs at time [employer], Income, Museum/gallery curator). CSW discussed process for applying for disability (servant center referral completed). CSW discussed Medicaid Application (Email sent to DSS contact and information given for the [place] Navigator). CSW and family discussed the [employer] and eligibility.   CSW will continue to follow patient.  Marguerita Merles, LCSW Clinical Social Worker [facility]

## 2024-01-27 NOTE — Telephone Encounter (Signed)
Spoke to pt wife [name]. Henry May stated that the pt had a paracentesis yesterday and feels much better today.

## 2024-01-27 NOTE — Progress Notes (Signed)
[facility] OFFICE PROGRESS NOTE   Diagnosis: Gastric cancer  INTERVAL HISTORY:   [name] returns as scheduled.  He underwent another paracentesis 01/26/2024.  He noted improvement in abdominal discomfort following the procedure.  He has periodic vomiting.  No nausea.  Bowels are moving.  No diarrhea.  He denies bleeding.  No pre-existing numbness/tingling in the hands or feet.  Objective:  Vital signs in last 24 hours:  Blood pressure (!) 128/91, pulse 93, temperature 98.1 F (36.7 C), temperature source Temporal, resp. rate 18, height 6' (1.829 m), weight 200 lb 6.4 oz (90.9 kg), SpO2 100%.    HEENT: No thrush or ulcers. Resp: Lungs clear bilaterally. Cardio: Regular rate and rhythm. GI: Abdomen is mildly distended. Vascular: No leg edema. Port-A-Cath without erythema.  Lab Results:  Lab Results  Component Value Date   WBC 9.8 01/22/2024   HGB 17.6 (H) 01/22/2024   HCT 52.0 01/22/2024   MCV 90.0 01/22/2024   PLT 287 01/22/2024   NEUTROABS 6.1 01/15/2024    Imaging:  US Paracentesis Result Date: 01/26/2024 INDICATION: Patient with history of gastric cancer with recurrent malignant ascites. Request received for diagnostic and therapeutic paracentesis up to 4 liters. EXAM: ULTRASOUND GUIDED DIAGNOSTIC AND THERAPEUTIC PARACENTESIS MEDICATIONS: 8 mL 1% lidocaine COMPLICATIONS: None immediate. PROCEDURE: Informed written consent was obtained from the patient after a discussion of the risks, benefits and alternatives to treatment. A timeout was performed prior to the initiation of the procedure. Initial ultrasound scanning demonstrates a moderate amount of ascites within the right lower abdominal quadrant. The right lower abdomen was prepped and draped in the usual sterile fashion. 1% lidocaine was used for local anesthesia. Following this, a 19 gauge, 7-cm, Yueh catheter was introduced. An ultrasound image was saved for documentation purposes. The paracentesis  was performed. The catheter was removed and a dressing was applied. The patient tolerated the procedure well without immediate post procedural complication. FINDINGS: A total of approximately 3.1 liters of slightly hazy, yellow fluid was removed. Samples were sent to the laboratory as requested by the clinical team. IMPRESSION: Successful ultrasound-guided paracentesis yielding 3.1 liters of peritoneal fluid. Performed by: Artemio Aly Electronically Signed   By: Corlis Leak M.D.   On: 01/26/2024 12:43    Medications: I have reviewed the patient's current medications.  Assessment/Plan: Gastric cancer 11/21/2023 EGD-gastric body with "infiltrative looking lesion"; biopsy shows involvement of a hypercellular lesion that suggests diffuse carcinoma by morphology CTs abdomen/pelvis 11/28/2023-diffuse fatty infiltration of the liver; gastric wall thickening; lymph nodes up to 6 mm in diameter near the stomach wall. 12/31/2023 upper endoscopy-large diffuse friable infiltrative polypoid and ulcerated circumferential mass found in the gastric body.  Scope was passed beyond the mass with normal antrum/pylorus.  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  Negative for HER2 (1+); mismatch repair protein IHC normal; PD-L1 CPS 0% PET scan 01/07/2024-circumferential hypermetabolic gastric mucosal thickening.  Ill-defined hypermetabolic lymph nodes in the gastrohepatic ligament.  Intense hypermetabolic activity associated with the right lobe of the thyroid gland.  Small hypermetabolic right axillary node favored reactive. Biopsy omental/peritoneal thickening 01/22/2024-poorly differentiated adenocarcinoma with focal signet ring cell features Paracentesis 01/22/2024-ascites positive for malignancy, adenocarcinoma Cycle 1 FOLFOX planned 02/02/2024 Early satiety, postprandial abdominal pain, weight loss secondary to #1 History of bilateral lower  extremity DVT 2021-anticoagulation discontinued due to massive retroperitoneal bleed, IVC filter placed 08/24/2020 Hospitalized with acute respiratory failure due to COVID-19 08/06/2020 -  09/04/2020 History of massive retroperitoneal bleed secondary to anticoagulation September 2021 Thyroid ultrasound 01/13/2024-no abnormal nodule identified in the right lobe.  1.1 cm thyroid isthmus nodule meets criteria for 1 year follow-up ultrasound.  Disposition: Henry May appears unchanged.  We reviewed the results of the omental/peritoneal biopsy and analysis of ascites with him and his wife at today's visit.  They understand both show cancer and that no therapy will be curative.  We discussed systemic therapy with FOLFOX.  We reviewed the palliative intent of treatment.  We discussed potential side effects associated with chemotherapy including bone marrow toxicity, nausea, hair loss, allergic reaction.  We discussed the various forms of neuropathy associated with oxaliplatin.  We reviewed potential side effects associated with 5-fluorouracil including mouth sores, diarrhea, hand-foot syndrome, skin rash, increased sensitivity to sun, skin hyperpigmentation, cardiotoxicity.  He would like to begin treatment as soon as possible.  He will attend a chemotherapy education class today.   CLDN18.2 testing has been requested.  He will return for cycle 1 FOLFOX on 02/02/2024.  We will see him in follow-up prior to cycle 2 on 02/16/2024.  We are available to see him sooner if needed.  Patient seen with Dr. Truett Perna.    Lonna Cobb ANP/GNP-BC   01/27/2024  11:34 AM  This was a shared visit with Lonna Cobb.  Henry May was interviewed and examined.  He has been diagnosed with metastatic gastric cancer.  We reviewed results of the omentum and ascitic fluid pathology.  He understands no therapy will be curative.  The goals of treatment are to improve symptoms and extend survival.  The tumor is negative for HER2 and PD-L1.   Mismatch repair protein expression is normal.  He is not a candidate for HER2 directed therapy or immunotherapy at present.  We are submitted in the tumor for CLDN18.2 testing.  If positive zolbetuximab will be added to the chemotherapy regimen. I recommend treatment with FOLFOX.  We reviewed potential toxicities associated with the FOLFOX regimen.  He agrees to proceed.  He will attend a chemotherapy teaching class today.  He will begin a trial of tramadol for pain.  I was present for greater than 50% of today's visit.  I performed Medical Decision Making.  Mancel Bale, MD

## 2024-01-28 ENCOUNTER — Encounter: Payer: Self-pay | Admitting: *Deleted

## 2024-01-28 NOTE — Progress Notes (Signed)
Contacted [facility] and requested CLDN 18.2 testing to accession number [id number]

## 2024-01-29 LAB — CYTOLOGY - NON PAP

## 2024-01-29 NOTE — Progress Notes (Signed)
Pharmacist Chemotherapy Monitoring - Initial Assessment    Anticipated start date: 02/02/24   The following has been reviewed per standard work regarding the patient's treatment regimen: The patient's diagnosis, treatment plan and drug doses, and organ/hematologic function Lab orders and baseline tests specific to treatment regimen  The treatment plan start date, drug sequencing, and pre-medications Prior authorization status  Patient's documented medication list, including drug-drug interaction screen and prescriptions for anti-emetics and supportive care specific to the treatment regimen The drug concentrations, fluid compatibility, administration routes, and timing of the medications to be used The patient's access for treatment and lifetime cumulative dose history, if applicable  The patient's medication allergies and previous infusion related reactions, if applicable   Changes made to treatment plan:  N/A  Follow up needed:  N/A   Daylene Katayama, RPH, 01/29/2024  9:00 AM

## 2024-01-30 ENCOUNTER — Other Ambulatory Visit: Payer: Self-pay | Admitting: Oncology

## 2024-01-30 ENCOUNTER — Ambulatory Visit: Payer: No Typology Code available for payment source | Admitting: Nurse Practitioner

## 2024-01-30 ENCOUNTER — Other Ambulatory Visit: Payer: No Typology Code available for payment source

## 2024-02-01 ENCOUNTER — Observation Stay (HOSPITAL_COMMUNITY)
Admission: EM | Admit: 2024-02-01 | Discharge: 2024-02-02 | Disposition: A | Discharge status: Home / Self Care | Payer: No Typology Code available for payment source | Attending: Family Medicine | Admitting: Family Medicine

## 2024-02-01 ENCOUNTER — Emergency Department (HOSPITAL_COMMUNITY): Payer: No Typology Code available for payment source

## 2024-02-01 ENCOUNTER — Other Ambulatory Visit: Payer: Self-pay

## 2024-02-01 ENCOUNTER — Encounter (HOSPITAL_COMMUNITY): Payer: Self-pay

## 2024-02-01 DIAGNOSIS — I10 Essential (primary) hypertension: Secondary | ICD-10-CM | POA: Insufficient documentation

## 2024-02-01 DIAGNOSIS — C169 Malignant neoplasm of stomach, unspecified: Secondary | ICD-10-CM | POA: Diagnosis not present

## 2024-02-01 DIAGNOSIS — N179 Acute kidney failure, unspecified: Secondary | ICD-10-CM | POA: Diagnosis not present

## 2024-02-01 DIAGNOSIS — R180 Malignant ascites: Secondary | ICD-10-CM | POA: Diagnosis not present

## 2024-02-01 DIAGNOSIS — E8721 Acute metabolic acidosis: Secondary | ICD-10-CM | POA: Insufficient documentation

## 2024-02-01 DIAGNOSIS — Z87891 Personal history of nicotine dependence: Secondary | ICD-10-CM | POA: Insufficient documentation

## 2024-02-01 DIAGNOSIS — Z8616 Personal history of COVID-19: Secondary | ICD-10-CM | POA: Insufficient documentation

## 2024-02-01 DIAGNOSIS — K219 Gastro-esophageal reflux disease without esophagitis: Secondary | ICD-10-CM | POA: Diagnosis not present

## 2024-02-01 DIAGNOSIS — Z86718 Personal history of other venous thrombosis and embolism: Secondary | ICD-10-CM | POA: Insufficient documentation

## 2024-02-01 DIAGNOSIS — R109 Unspecified abdominal pain: Secondary | ICD-10-CM | POA: Diagnosis present

## 2024-02-01 DIAGNOSIS — Z85028 Personal history of other malignant neoplasm of stomach: Secondary | ICD-10-CM | POA: Insufficient documentation

## 2024-02-01 DIAGNOSIS — E871 Hypo-osmolality and hyponatremia: Secondary | ICD-10-CM | POA: Diagnosis not present

## 2024-02-01 LAB — COMPREHENSIVE METABOLIC PANEL
ALT: 37 U/L (ref 0–44)
AST: 33 U/L (ref 15–41)
Albumin: 3 g/dL — ABNORMAL LOW (ref 3.5–5.0)
Alkaline Phosphatase: 85 U/L (ref 38–126)
Anion gap: 14 (ref 5–15)
BUN: 41 mg/dL — ABNORMAL HIGH (ref 6–20)
CO2: 19 mmol/L — ABNORMAL LOW (ref 22–32)
Calcium: 8.7 mg/dL — ABNORMAL LOW (ref 8.9–10.3)
Chloride: 98 mmol/L (ref 98–111)
Creatinine, Ser: 1.44 mg/dL — ABNORMAL HIGH (ref 0.61–1.24)
GFR, Estimated: 56 mL/min — ABNORMAL LOW (ref >60)
Glucose, Bld: 119 mg/dL — ABNORMAL HIGH (ref 70–99)
Potassium: 4.8 mmol/L (ref 3.5–5.1)
Sodium: 131 mmol/L — ABNORMAL LOW (ref 135–145)
Total Bilirubin: 0.4 mg/dL (ref 0.0–1.2)
Total Protein: 7.3 g/dL (ref 6.5–8.1)

## 2024-02-01 LAB — URINALYSIS, ROUTINE W REFLEX MICROSCOPIC
Bilirubin Urine: NEGATIVE
Glucose, UA: NEGATIVE mg/dL
Hgb urine dipstick: NEGATIVE
Ketones, ur: NEGATIVE mg/dL
Leukocytes,Ua: NEGATIVE
Nitrite: NEGATIVE
Protein, ur: NEGATIVE mg/dL
Specific Gravity, Urine: 1.029 (ref 1.005–1.030)
pH: 5 (ref 5.0–8.0)

## 2024-02-01 LAB — CBC
HCT: 53 % — ABNORMAL HIGH (ref 39.0–52.0)
Hemoglobin: 18.1 g/dL — ABNORMAL HIGH (ref 13.0–17.0)
MCH: 30.7 pg (ref 26.0–34.0)
MCHC: 34.2 g/dL (ref 30.0–36.0)
MCV: 90 fL (ref 80.0–100.0)
Platelets: 319 10*3/uL (ref 150–400)
RBC: 5.89 MIL/uL — ABNORMAL HIGH (ref 4.22–5.81)
RDW: 12.3 % (ref 11.5–15.5)
WBC: 13.2 10*3/uL — ABNORMAL HIGH (ref 4.0–10.5)
nRBC: 0 % (ref 0.0–0.2)

## 2024-02-01 LAB — LIPASE, BLOOD: Lipase: 106 U/L — ABNORMAL HIGH (ref 11–51)

## 2024-02-01 LAB — MAGNESIUM: Magnesium: 2.6 mg/dL — ABNORMAL HIGH (ref 1.7–2.4)

## 2024-02-01 MED ORDER — OXYCODONE HCL 5 MG PO TABS
5.0000 mg | ORAL_TABLET | Freq: Four times a day (QID) | ORAL | Status: DC | PRN
  Administered 2024-02-02 (×2): 5 mg via ORAL
  Filled 2024-02-01 (×3): qty 1

## 2024-02-01 MED ORDER — HYDROMORPHONE HCL 1 MG/ML IJ SOLN
0.5000 mg | Freq: Four times a day (QID) | INTRAMUSCULAR | Status: DC | PRN
  Administered 2024-02-02 (×2): 0.5 mg via INTRAVENOUS
  Filled 2024-02-01 (×2): qty 0.5

## 2024-02-01 MED ORDER — SENNOSIDES-DOCUSATE SODIUM 8.6-50 MG PO TABS
1.0000 | ORAL_TABLET | Freq: Every evening | ORAL | Status: DC | PRN
  Administered 2024-02-01: 1 via ORAL
  Filled 2024-02-01: qty 1

## 2024-02-01 MED ORDER — MELATONIN 5 MG PO TABS
5.0000 mg | ORAL_TABLET | Freq: Every evening | ORAL | Status: DC | PRN
  Administered 2024-02-01: 5 mg via ORAL
  Filled 2024-02-01: qty 1

## 2024-02-01 MED ORDER — ACETAMINOPHEN 650 MG RE SUPP: 650.0000 mg | Freq: Four times a day (QID) | RECTAL | Status: DC | PRN

## 2024-02-01 MED ORDER — SODIUM CHLORIDE 0.9% FLUSH: 10.0000 mL | INTRAVENOUS | Status: DC | PRN

## 2024-02-01 MED ORDER — CHLORHEXIDINE GLUCONATE CLOTH 2 % EX PADS
6.0000 | MEDICATED_PAD | Freq: Every day | CUTANEOUS | Status: DC
  Administered 2024-02-01 – 2024-02-02 (×2): 6 via TOPICAL

## 2024-02-01 MED ORDER — ACETAMINOPHEN 325 MG PO TABS: 650.0000 mg | ORAL_TABLET | Freq: Four times a day (QID) | ORAL | Status: DC | PRN

## 2024-02-01 MED ORDER — LACTATED RINGERS IV SOLN: INTRAVENOUS | Status: DC

## 2024-02-01 MED ORDER — ONDANSETRON HCL 4 MG/2ML IJ SOLN: 4.0000 mg | Freq: Four times a day (QID) | INTRAMUSCULAR | Status: DC | PRN

## 2024-02-01 MED ORDER — SIMETHICONE 80 MG PO CHEW
80.0000 mg | CHEWABLE_TABLET | Freq: Once | ORAL | Status: AC
  Administered 2024-02-01: 80 mg via ORAL
  Filled 2024-02-01: qty 1

## 2024-02-01 MED ORDER — PANTOPRAZOLE SODIUM 40 MG PO TBEC
40.0000 mg | DELAYED_RELEASE_TABLET | Freq: Every day | ORAL | Status: DC
  Administered 2024-02-01 – 2024-02-02 (×2): 40 mg via ORAL
  Filled 2024-02-01 (×2): qty 1

## 2024-02-01 MED ORDER — LACTATED RINGERS IV BOLUS
1000.0000 mL | Freq: Once | INTRAVENOUS | Status: AC
  Administered 2024-02-01: 1000 mL via INTRAVENOUS

## 2024-02-01 MED ORDER — ONDANSETRON HCL 4 MG PO TABS: 4.0000 mg | ORAL_TABLET | Freq: Four times a day (QID) | ORAL | Status: DC | PRN

## 2024-02-01 MED ORDER — ALTEPLASE 2 MG IJ SOLR
2.0000 mg | Freq: Once | INTRAMUSCULAR | Status: AC
  Administered 2024-02-01: 2 mg
  Filled 2024-02-01: qty 2

## 2024-02-01 MED ORDER — ENOXAPARIN SODIUM 40 MG/0.4ML IJ SOSY
40.0000 mg | PREFILLED_SYRINGE | INTRAMUSCULAR | Status: DC
  Administered 2024-02-01: 40 mg via SUBCUTANEOUS
  Filled 2024-02-01: qty 0.4

## 2024-02-01 NOTE — ED Notes (Signed)
 Patient has a urine culture in the main lab

## 2024-02-01 NOTE — Consult Note (Signed)
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 -Last paracentesis was on 01/26/2024. -Unfortunately they could not do paracentesis today.  Tentatively plan to do paracentesis tomorrow, 02/02/2024. -Patient was also found to have acute renal dysfunction with creatinine of 1.44, from baseline around 1.  Likely from dehydration.  Hence patient will receive IV fluids. -He is getting admitted to the hospital for management of acute renal dysfunction and also for paracentesis. -He is scheduled for chemotherapy initiation tomorrow at our [facility].  I will discuss with our team to move chemo appointment to 02/03/2024 or 02/04/2024.  Plan is to proceed with FOLFOX regimen. -Please manage his pain aggressively.  Continue gentle hydration to improve his renal function.  Paracentesis should also help in this regard.  Rest of the management is as per primary team.  Please call us with any questions or concerns.  Thank you for giving [place] opportunity to participate in his care.   Meryl Crutch, MD Medical Oncology and Hematology 02/01/2024 3:03 PM   HISTORY OF PRESENT ILLNESS:  Henry May is a 58 y.o. gentleman who was recently diagnosed with stage IVb gastric cancer  with malignant ascites, peritoneal carcinomatosis, scheduled to start chemotherapy with FOLFOX from 02/02/2024 under the direction of Dr. Truett Perna, presented to the ED overnight with complaints of worsening abdominal pain, gastric distention, decreased oral intake, early satiety.  We were consulted for any additional recommendations and also since patient is scheduled to begin chemotherapy tomorrow.  Please review notes dated 01/27/2024 for additional details regarding his diagnosis and timeline of events.  Patient seen and evaluated in the ED.  His daughter, his wife and other family members were by the bedside.  Patient has had progressive difficulty in swallowing and has had reduced oral intake as a result.  Also has early satiety.  Denies any blood in stools, black-colored stools, nausea or vomiting.  Denies any fevers or chills.  He has had progressive distention of his abdomen.  Last paracentesis was on 01/26/2024.  He does have malignant ascites.   Oncology History  Gastric cancer (HCC)  01/26/2024 Initial Diagnosis   Gastric cancer (HCC)   01/26/2024 Cancer Staging   Staging form: Stomach, AJCC 8th Edition - Clinical: Stage IVB (cTX, cNX, pM1) - Signed by Ladene Artist, MD on 01/26/2024 Histologic grade (G): G3 Histologic grading system: 3 grade system   02/02/2024 -  Chemotherapy   Patient is on Treatment Plan : GASTROESOPHAGEAL FOLFOX q14d x 6 cycles       MEDICAL HISTORY Past Medical History:  Diagnosis Date   Acute respiratory failure (HCC) 08/06/2020   Chronic cough    SINCE 08-06-2020 COVID PNEUMONIA   COVID 08/06/2020   Dvt femoral (deep venous thrombosis) (HCC) 08/2020   LEFT LEG    History of blood transfusion 08/2020   AFTER MI 5 UNITS GIVEN   History  of kidney stones    Hypertension    Numbness    LEFT LEG AT TIMES   Pneumonia 08/06/2020   COVID PNEUMONIA     SURGICAL HISTORY Past Surgical History:  Procedure Laterality Date   CYSTOSCOPY WITH STENT  PLACEMENT Left 10/06/2020   Procedure: CYSTOSCOPY WITH STENT PLACEMENT;  Surgeon: Rene Paci, MD;  Location: [facility];  Service: Urology;  Laterality: Left;   CYSTOSCOPY/URETEROSCOPY/HOLMIUM LASER/STENT PLACEMENT Left 11/14/2020   Procedure: CYSTOSCOPY/URETEROSCOPY/HOLMIUM LASER/STENT PLACEMENT;  Surgeon: Rene Paci, MD;  Location: [facility];  Service: Urology;  Laterality: Left;  ONLY NEEDS 45 MIN   IR IMAGING GUIDED PORT INSERTION  01/22/2024   IR IVC FILTER PLMT / S&I /IMG GUID/MOD SED  08/24/2020   IR PARACENTESIS  01/22/2024     ALLERGIES  No Known Allergies  FAMILY HISTORY  Family History  Problem Relation Age of Onset   Cancer Mother        type unknown   Diabetes Mellitus II Neg Hx      SOCIAL HISTORY   Social History   Socioeconomic History   Marital status: Married    Spouse name: Not on file   Number of children: 3   Years of education: Not on file   Highest education level: Not on file  Occupational History   Not on file  Tobacco Use   Smoking status: Former    Current packs/day: 0.00    Types: Cigarettes    Quit date: 1986    Years since quitting: 39.1    Passive exposure: Never   Smokeless tobacco: Never  Vaping Use   Vaping status: Never Used  Substance and Sexual Activity   Alcohol use: Yes    Comment: occasional   Drug use: Never   Sexual activity: Yes  Other Topics Concern   Not on file  Social History Narrative   Not on file   Social Drivers of Health   Financial Resource Strain: Not on file  Food Insecurity: Not on file  Transportation Needs: Not on file  Physical Activity: Not on file  Stress: Not on file  Social Connections: Unknown (04/30/2022)   Received from [facility]   Social Network    Social Network: Not on file  Intimate Partner Violence: Unknown (03/22/2022)   Received from [facility]   HITS    Physically Hurt: Not on file    Insult or Talk Down To: Not on file     Threaten Physical Harm: Not on file    Scream or Curse: Not on file    CURRENT MEDICATIONS   Current Outpatient Medications  Medication Instructions   lidocaine-prilocaine (EMLA) cream 1 Application, Topical, As directed, Apply 1/2 tablespoon to port site 1-2 hours prior to stick and cover with Press-and-Seal to numb port site. Do not start until 14 days of port placement.   omeprazole (PRILOSEC) 20 mg, Every 12 hours   ondansetron (ZOFRAN) 8 mg, Oral, Every 8 hours PRN, May start 72 hours after chemotherapy treatment given due to receiving Aloxi on day 1   prochlorperazine (COMPAZINE) 10 mg, Oral, Every 6 hours PRN   traMADol (ULTRAM) 50 mg, Oral, Every 8 hours PRN, Do not drive while taking pain medication.     REVIEW OF SYSTEMS   Review of Systems - Oncology  All other pertinent review of systems is negative except as mentioned above in HPI  PHYSICAL EXAMINATION  ECOG PERFORMANCE STATUS: 2 - Symptomatic, <50% confined  to bed  Vitals:   02/01/24 1400 02/01/24 1445  BP: (!) 139/98 (!) 145/94  Pulse: 73 87  Resp:  16  Temp:    SpO2: 97% 97%   There were no vitals filed for this visit.  Physical Exam Constitutional:      General: He is not in acute distress.    Appearance: Normal appearance.  HENT:     Head: Normocephalic and atraumatic.  Eyes:     General: No scleral icterus.    Conjunctiva/sclera: Conjunctivae normal.  Cardiovascular:     Rate and Rhythm: Normal rate and regular rhythm.     Heart sounds: Normal heart sounds.  Pulmonary:     Effort: Pulmonary effort is normal.     Breath sounds: Normal breath sounds.  Abdominal:     General: There is distension.     Tenderness: There is no abdominal tenderness.  Musculoskeletal:     Right lower leg: No edema.     Left lower leg: No edema.  Neurological:     General: No focal deficit present.     Mental Status: He is alert and oriented to person, place, and time.  Psychiatric:        Mood and Affect: Mood  normal.        Behavior: Behavior normal.        Thought Content: Thought content normal.     LABORATORY DATA:   I have reviewed the data as listed  Results for orders placed or performed during the hospital encounter of 02/01/24 (from the past 24 hours)  Lipase, blood   Collection Time: 02/01/24  4:31 AM  Result Value Ref Range   Lipase 106 (H) 11 - 51 U/L  Comprehensive metabolic panel   Collection Time: 02/01/24  4:31 AM  Result Value Ref Range   Sodium 131 (L) 135 - 145 mmol/L   Potassium 4.8 3.5 - 5.1 mmol/L   Chloride 98 98 - 111 mmol/L   CO2 19 (L) 22 - 32 mmol/L   Glucose, Bld 119 (H) 70 - 99 mg/dL   BUN 41 (H) 6 - 20 mg/dL   Creatinine, Ser 1.61 (H) 0.61 - 1.24 mg/dL   Calcium 8.7 (L) 8.9 - 10.3 mg/dL   Total Protein 7.3 6.5 - 8.1 g/dL   Albumin 3.0 (L) 3.5 - 5.0 g/dL   AST 33 15 - 41 U/L   ALT 37 0 - 44 U/L   Alkaline Phosphatase 85 38 - 126 U/L   Total Bilirubin 0.4 0.0 - 1.2 mg/dL   GFR, Estimated 56 (L) >60 mL/min   Anion gap 14 5 - 15  CBC   Collection Time: 02/01/24  4:31 AM  Result Value Ref Range   WBC 13.2 (H) 4.0 - 10.5 K/uL   RBC 5.89 (H) 4.22 - 5.81 MIL/uL   Hemoglobin 18.1 (H) 13.0 - 17.0 g/dL   HCT 09.6 (H) 04.5 - 40.9 %   MCV 90.0 80.0 - 100.0 fL   MCH 30.7 26.0 - 34.0 pg   MCHC 34.2 30.0 - 36.0 g/dL   RDW 81.1 91.4 - 78.2 %   Platelets 319 150 - 400 K/uL   nRBC 0.0 0.0 - 0.2 %  Magnesium   Collection Time: 02/01/24  4:31 AM  Result Value Ref Range   Magnesium 2.6 (H) 1.7 - 2.4 mg/dL  Urinalysis, Routine w reflex microscopic -Urine, Clean Catch   Collection Time: 02/01/24  8:35 AM  Result Value Ref Range   Color,  Urine AMBER (A) YELLOW   APPearance HAZY (A) CLEAR   Specific Gravity, Urine 1.029 1.005 - 1.030   pH 5.0 5.0 - 8.0   Glucose, UA NEGATIVE NEGATIVE mg/dL   Hgb urine dipstick NEGATIVE NEGATIVE   Bilirubin Urine NEGATIVE NEGATIVE   Ketones, ur NEGATIVE NEGATIVE mg/dL   Protein, ur NEGATIVE NEGATIVE mg/dL   Nitrite NEGATIVE  NEGATIVE   Leukocytes,Ua NEGATIVE NEGATIVE      RADIOGRAPHIC STUDIES:  I have personally reviewed the radiological images as listed and agree with the findings in the report.  CT ABDOMEN PELVIS WO CONTRAST Result Date: 02/01/2024 CLINICAL DATA:  Abdominal pain, acute, nonlocalized. History of gastric cancer, metastatic. * Tracking Code: BO * EXAM: CT ABDOMEN AND PELVIS WITHOUT CONTRAST TECHNIQUE: Multidetector CT imaging of the abdomen and pelvis was performed following the standard protocol without IV contrast. RADIATION DOSE REDUCTION: This exam was performed according to the departmental dose-optimization program which includes automated exposure control, adjustment of the mA and/or kV according to patient size and/or use of iterative reconstruction technique. COMPARISON:  January 07, 2024. FINDINGS: Evaluation is limited by lack of IV contrast. Lower chest: No acute abnormality. Similar appearance of bibasilar interstitial lung disease. Hepatobiliary: Gallbladder is distended. No extrahepatic biliary ductal dilation. No new focal hepatic abnormality. Pancreas: No new peripancreatic fat stranding. Spleen: Unremarkable. Adrenals/Urinary Tract: Adrenal glands are unremarkable. No hydronephrosis. Nonobstructing bilateral nephrolithiasis. Bladder is unremarkable. Stomach/Bowel: No evidence of bowel obstruction. Similar appearance of gastric wall thickening consistent with known malignancy. Vascular/Lymphatic: No aneurysmal dilation of the abdominal aorta. IVC filter. Similar shotty lymphadenopathy throughout the upper abdomen. Representative porta hepatic lymph node measures 8 mm in the short axis, previously 5 mm (series 2, image 24). Infiltrative soft tissue throughout the upper abdomen consistent with peritoneal carcinomatosis. Reproductive: Prostate is unremarkable. Other: Moderate volume ascites. Revisualization of interdigitating soft tissue throughout the omentum, favored overall increased since  prior PET CT. This is consistent with biopsy-proven peritoneal carcinomatosis. Fat containing inguinal hernias. Musculoskeletal: No acute or significant osseous findings. IMPRESSION: 1. No evidence of bowel obstruction. 2. Similar appearance of gastric wall thickening consistent with known malignancy. 3. Moderate volume ascites with peritoneal carcinomatosis, favored overall increased since prior PET CT. 4. Nonobstructing bilateral nephrolithiasis. Electronically Signed   By: Meda Klinefelter M.D.   On: 02/01/2024 14:02   US Paracentesis Result Date: 01/26/2024 INDICATION: Patient with history of gastric cancer with recurrent malignant ascites. Request received for diagnostic and therapeutic paracentesis up to 4 liters. EXAM: ULTRASOUND GUIDED DIAGNOSTIC AND THERAPEUTIC PARACENTESIS MEDICATIONS: 8 mL 1% lidocaine COMPLICATIONS: None immediate. PROCEDURE: Informed written consent was obtained from the patient after a discussion of the risks, benefits and alternatives to treatment. A timeout was performed prior to the initiation of the procedure. Initial ultrasound scanning demonstrates a moderate amount of ascites within the right lower abdominal quadrant. The right lower abdomen was prepped and draped in the usual sterile fashion. 1% lidocaine was used for local anesthesia. Following this, a 19 gauge, 7-cm, Yueh catheter was introduced. An ultrasound image was saved for documentation purposes. The paracentesis was performed. The catheter was removed and a dressing was applied. The patient tolerated the procedure well without immediate post procedural complication. FINDINGS: A total of approximately 3.1 liters of slightly hazy, yellow fluid was removed. Samples were sent to the laboratory as requested by the clinical team. IMPRESSION: Successful ultrasound-guided paracentesis yielding 3.1 liters of peritoneal fluid. Performed by: Artemio Aly Electronically Signed   By: Ronald Pippins.D.  On: 01/26/2024  12:43   IR Paracentesis Result Date: 01/22/2024 INDICATION: 58 year old with gastric cancer and evidence for peritoneal disease. Patient has developed a large volume of ascites EXAM: ULTRASOUND GUIDED DIAGNOSTIC AND THERAPEUTIC PARACENTESIS MEDICATIONS: None. COMPLICATIONS: None immediate. PROCEDURE: Informed written consent was obtained from the patient after a discussion of the risks, benefits and alternatives to treatment. A timeout was performed prior to the initiation of the procedure. Initial ultrasound scanning demonstrates a large amount of ascites within the right upper abdominal quadrant. The right lower abdomen was prepped and draped in the usual sterile fashion. 1% lidocaine was used for local anesthesia. Following this, a 6 Fr Safe-T-Centesis catheter was introduced. An ultrasound image was saved for documentation purposes. The paracentesis was performed. The catheter was removed and a dressing was applied. The patient tolerated the procedure well without immediate post procedural complication. FINDINGS: Largest pocket of fluid was in the right upper abdomen around the liver. A total of approximately 2.9 L of yellow fluid was removed. Samples were sent to the laboratory as requested by the clinical team. IMPRESSION: Successful ultrasound-guided paracentesis yielding 2.9 liters of peritoneal fluid. Electronically Signed   By: Richarda Overlie M.D.   On: 01/22/2024 10:46   IR IMAGING GUIDED PORT INSERTION Result Date: 01/22/2024 INDICATION: 58 year old with gastric cancer. Patient needs durable venous access for treatment. EXAM: FLUOROSCOPIC AND ULTRASOUND GUIDED PLACEMENT OF A SUBCUTANEOUS PORT COMPARISON:  None Available. MEDICATIONS: Moderate sedation ANESTHESIA/SEDATION: Moderate (conscious) sedation was employed during this procedure. A total of Versed 1 mg and fentanyl 75 mcg was administered intravenously at the order of the provider performing the procedure. Total intra-service moderate sedation  time: 26 minutes. Patient's level of consciousness and vital signs were monitored continuously by radiology nurse throughout the procedure under the supervision of the provider performing the procedure. FLUOROSCOPY TIME:  Radiation Exposure Index (as provided by the fluoroscopic device): 4 mGy Kerma COMPLICATIONS: None immediate. PROCEDURE: The procedure, risks, benefits, and alternatives were explained to the patient. Questions regarding the procedure were encouraged and answered. The patient understands and consents to the procedure. Patient was placed supine on the interventional table. Ultrasound confirmed a patent right internal jugular vein. Ultrasound image was saved for documentation. The right chest and neck were cleaned with a skin antiseptic and a sterile drape was placed. Maximal barrier sterile technique was utilized including caps, mask, sterile gowns, sterile gloves, sterile drape, hand hygiene and skin antiseptic. The right neck was anesthetized with 1% lidocaine. Small incision was made in the right neck with a blade. Micropuncture set was placed in the right internal jugular vein with ultrasound guidance. The micropuncture wire was used for measurement purposes. The right chest was anesthetized with 1% lidocaine with epinephrine. #15 blade was used to make an incision and a subcutaneous port pocket was formed. 8 french Power Port was assembled. Subcutaneous tunnel was formed with a stiff tunneling device. The port catheter was brought through the subcutaneous tunnel. The port was placed in the subcutaneous pocket. The micropuncture set was exchanged for a peel-away sheath. The catheter was placed through the peel-away sheath and the tip was positioned at the superior cavoatrial junction. Catheter placement was confirmed with fluoroscopy. The port was accessed and flushed with heparinized saline. The port pocket was closed using two layers of absorbable sutures and Dermabond. The vein skin site was  closed using a single layer of absorbable suture and Dermabond. Sterile dressings were applied. Patient tolerated the procedure well without an immediate complication. Ultrasound and  fluoroscopic images were taken and saved for this procedure. IMPRESSION: Placement of a subcutaneous power-injectable port device. Catheter tip at the superior cavoatrial junction. Electronically Signed   By: Richarda Overlie M.D.   On: 01/22/2024 10:43   CT ABDOMINAL MASS BIOPSY Result Date: 01/22/2024 INDICATION: 58 year old with gastric cancer. Request for peritoneal biopsy to stage the disease. EXAM: CT-GUIDED BIOPSY OF OMENTAL/PERITONEAL THICKENING TECHNIQUE: Multidetector CT imaging of the abdomen was performed following the standard protocol with/without IV contrast. RADIATION DOSE REDUCTION: This exam was performed according to the departmental dose-optimization program which includes automated exposure control, adjustment of the mA and/or kV according to patient size and/or use of iterative reconstruction technique. MEDICATIONS: Moderate sedation ANESTHESIA/SEDATION: Moderate (conscious) sedation was employed during this procedure. A total of Versed 1 mg and Fentanyl 75 mcg was administered intravenously by the radiology nurse. Total intra-service moderate Sedation Time: 15 minutes. The patient's level of consciousness and vital signs were monitored continuously by radiology nursing throughout the procedure under my direct supervision. COMPLICATIONS: None immediate. PROCEDURE: Informed written consent was obtained from the patient after a thorough discussion of the procedural risks, benefits and alternatives. All questions were addressed. A timeout was performed prior to the initiation of the procedure. CT images through the abdomen were obtained. Extensive omental/peritoneal thickening in the anterior abdomen was identified and targeted for biopsy. The left anterior abdomen was prepped with chlorhexidine and sterile field was  created. Skin was anesthetized with 1% lidocaine. Small incision was made. Using CT guidance, a 17 gauge coaxial needle was directed into the left anterior peritoneal thickening. Core biopsies were obtained with an 18 gauge device. Specimens placed in formalin. Needle was removed without complication. Bandage placed over the puncture site. FINDINGS: CT images of the abdomen demonstrate progression of peritoneal disease since 01/07/2024. The patient has now developed increased ascites particularly in the perihepatic region. In addition, there is markedly increased omental/peritoneal thickening in the anterior abdomen. Biopsy needle was directed into the left anterior abdominal peritoneal disease. No immediate bleeding or hematoma formation. IMPRESSION: 1. CT-guided biopsy of omental/peritoneal thickening. 2. Progression of peritoneal disease demonstrated by increased peritoneal thickening and ascites. Electronically Signed   By: Richarda Overlie M.D.   On: 01/22/2024 10:42   US THYROID Result Date: 01/15/2024 CLINICAL DATA:  Incidental on PET. Increased metabolic activity by PET throughout much of the right lobe and possibly extending posterior to the right lobe. EXAM: THYROID ULTRASOUND TECHNIQUE: Ultrasound examination of the thyroid gland and adjacent soft tissues was performed. COMPARISON:  PET scan on 01/07/2024 FINDINGS: Parenchymal Echotexture: Normal Isthmus: 0.7 cm Right lobe: 5.1 x 1.8 x 2.4 cm Left lobe: 4.0 x 1.4 x 1.2 cm _________________________________________________________ Estimated total number of nodules >/= 1 cm: 1 Number of spongiform nodules >/=  2 cm not described below (TR1): 0 Number of mixed cystic and solid nodules >/= 1.5 cm not described below (TR2): 0 _________________________________________________________ Nodule # 1: Location: Isthmus Maximum size: 1.1 cm; Other 2 dimensions: 1.1 x 0.7 cm Composition: solid/almost completely solid (2) Echogenicity: hypoechoic (2) Shape: not  taller-than-wide (0) Margins: smooth (0) Echogenic foci: none (0) ACR TI-RADS total points: 4. ACR TI-RADS risk category: TR4 (4-6 points). ACR TI-RADS recommendations: *Given size (>/= 1 - 1.4 cm) and appearance, a follow-up ultrasound in 1 year should be considered based on TI-RADS criteria. _________________________________________________________ No abnormal nodule is identified in the right lobe. There is no identifiable abnormal parathyroid soft tissue nodules. No enlarged or abnormal appearing lymph nodes are identified. IMPRESSION:  1. 1.1 cm thyroid isthmus nodule meets criteria for 1 year follow-up ultrasound. 2. No sonographic correlate is identified for the increased metabolic activity within the right lobe or potentially extending posterior to the right lobe. There is no evidence of an obvious parathyroid adenoma by ultrasound. The above is in keeping with the ACR TI-RADS recommendations - J Am Coll Radiol 2017;14:587-595. Electronically Signed   By: Irish Lack M.D.   On: 01/15/2024 13:36   NM PET Image Initial (PI) Skull Base To Thigh (F-18 FDG) Result Date: 01/08/2024 CLINICAL DATA:  Malignant gastric tumor of the stomach. Initial treatment strategy. EXAM: NUCLEAR MEDICINE PET SKULL BASE TO THIGH TECHNIQUE: 10.8 mCi F-18 FDG was injected intravenously. Full-ring PET imaging was performed from the skull base to thigh after the radiotracer. CT data was obtained and used for attenuation correction and anatomic localization. Fasting blood glucose: 113 mg/dl COMPARISON:  Outside CT 11/28/2023 FINDINGS: NECK: Intense hypermetabolic activity localizes to the RIGHT lobe of thyroid gland with SUV max equal 7.7 on image 52. There is posterior nodular extension of the RIGHT lobe of the gland with a 15 mm nodule adjacent to the esophagus with equal intensity radiotracer activity Incidental CT findings: None. CHEST: Moderate radiotracer activity associated small RIGHT axillary lymph node with max SUV max  equal 4.0 on image 77. No paraesophageal metabolic adenopathy. No suspicious pulmonary nodules. Incidental CT findings: Mild chronic interstitial lung disease noted. ABDOMEN/PELVIS: There circumferential gastric mucosal thickening involving the gastric body and antrum. This circumferential thickening is diffusely hypermetabolic SUV max equal 7.0 on image 101 for example. There is ill-defined hypermetabolic lymph nodes in the gastrohepatic ligament with SUV max equal 6.8 on image 105. No discrete measurable nodes. There is haziness within the gastrohepatic ligament peritoneal fat (image 105/4). No abnormal metabolic activity in the liver. Hypermetabolic abdominopelvic lymph nodes. Incidental CT findings: . Uterus and adnexa unremarkable. Infrarenal IVC filter in place. SKELETON: No focal hypermetabolic activity to suggest skeletal metastasis. Incidental CT findings: None. IMPRESSION: 1. Circumferential hypermetabolic gastric mucosal thickening consistent with primary gastric adenocarcinoma. 2. Ill-defined hypermetabolic lymph nodes in the gastrohepatic ligament concerning for with local nodal metastasis. 3. No evidence of liver metastasis. 4. Intense hypermetabolic activity associated with the RIGHT lobe of thyroid gland. Favor primary thyroid neoplasm. Recommend ultrasound of the thyroid gland and fine needle aspiration. 5. Small hypermetabolic RIGHT axillary lymph node is favored reactive. Electronically Signed   By: Genevive Bi M.D.   On: 01/08/2024 16:20     This document was completed utilizing speech recognition software. Grammatical errors, random word insertions, pronoun errors, and incomplete sentences are an occasional consequence of this system due to software limitations, ambient noise, and hardware issues. Any formal questions or concerns about the content, text or information contained within the body of this dictation should be directly addressed to the provider for clarification.

## 2024-02-01 NOTE — ED Notes (Signed)
Pt refused IV and bolus until they talk to the MD.

## 2024-02-01 NOTE — H&P (Signed)
History and Physical  Henry May ZOX:[id number] DOB: Apr 28, 1966 DOA: 02/01/2024  PCP: Tiffany Kocher, DO   Chief Complaint: Abdominal pain/swelling  HPI: Henry May is a 58 y.o. 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 Patient reports she had a paracentesis with removal of 3 L last Monday. He has been doing well since then until the last few days where his abdominal swelling has gradually worsened.  He also reports narrowing of his esophagus so he is not able to eat very fast or drink as much as he wants to. He often has to drink water very slowly.  Over the last few days, he has had very little water and food to eat due to early satiety. He reports abdominal tightness but denies any nausea, vomiting, dysuria, fevers, chills, shortness of breath or chest pain.  ED Course: Vitals overall stable. Labs significant for lipase of 106, sodium 131, K+ 4.8, bicarb 19, BUN/creatinine 41/1.44, WBC 13.2, Hgb 18.1, platelet 319, mag 2.6, UA with no signs of infection. CT A/P shows moderate volume ascites with peritoneal carcinomatosis but has now increased. Oncology was consulted for evaluation. Ultrasound-guided paracentesis was ordered and patient received IV LR 1 L bolus. TRH was consulted for admission  Review of Systems: Please see HPI for pertinent positives and negatives. A complete 10 system review of systems are otherwise negative.  Past Medical History:  Diagnosis Date   Acute respiratory failure (HCC) 08/06/2020   Chronic cough    SINCE 08-06-2020 COVID PNEUMONIA   COVID 08/06/2020   Dvt femoral (deep venous thrombosis) (HCC) 08/2020   LEFT LEG    History of blood transfusion 08/2020   AFTER MI 5 UNITS GIVEN   History of kidney stones    Hypertension    Numbness    LEFT  LEG AT TIMES   Pneumonia 08/06/2020   COVID PNEUMONIA   Past Surgical History:  Procedure Laterality Date   CYSTOSCOPY WITH STENT PLACEMENT Left 10/06/2020   Procedure: CYSTOSCOPY WITH STENT PLACEMENT;  Surgeon: Rene Paci, MD;  Location: [facility];  Service: Urology;  Laterality: Left;   CYSTOSCOPY/URETEROSCOPY/HOLMIUM LASER/STENT PLACEMENT Left 11/14/2020   Procedure: CYSTOSCOPY/URETEROSCOPY/HOLMIUM LASER/STENT PLACEMENT;  Surgeon: Rene Paci, MD;  Location: [facility];  Service: Urology;  Laterality: Left;  ONLY NEEDS 45 MIN   IR IMAGING GUIDED PORT INSERTION  01/22/2024   IR IVC FILTER PLMT / S&I /IMG GUID/MOD SED  08/24/2020   IR PARACENTESIS  01/22/2024   Social History:  reports that he quit smoking about 39 years ago. His smoking use included cigarettes. He has never been exposed to tobacco smoke. He has never used smokeless tobacco. He reports current alcohol use. He reports that he does not use drugs.  No Known Allergies  Family History  Problem Relation Age of Onset   Cancer Mother        type unknown   Diabetes Mellitus II Neg Hx      Prior to Admission medications   Medication Sig Start Date End Date Taking? Authorizing Provider  lidocaine-prilocaine (EMLA) cream Apply 1 Application topically as directed. Apply 1/2 tablespoon to port site 1-2 hours prior to stick and cover with Press-and-Seal to numb port site. Do not start until 14 days of port placement. 01/27/24   Ladene Artist, MD  omeprazole (PRILOSEC) 20 MG capsule Take 20  mg by mouth every 12 (twelve) hours.    [provider]  ondansetron (ZOFRAN) 8 MG tablet Take 1 tablet (8 mg total) by mouth every 8 (eight) hours as needed for nausea. May start 72 hours after chemotherapy treatment given due to receiving Aloxi on day 1 01/27/24   Ladene Artist, MD  prochlorperazine (COMPAZINE) 10 MG tablet Take 1 tablet (10 mg total) by mouth every 6 (six) hours as needed for nausea. 01/27/24    Ladene Artist, MD  traMADol (ULTRAM) 50 MG tablet Take 1 tablet (50 mg total) by mouth every 8 (eight) hours as needed. Do not drive while taking pain medication. 01/27/24   Rana Snare, NP    Physical Exam: BP (!) 145/94   Pulse 87   Temp 97.7 F (36.5 C) (Oral)   Resp 16   SpO2 97%  General: Pleasant, well-appearing man laying in bed. No acute distress. HEENT: [place]/AT. Anicteric sclera. Dry mucous membrane. CV: RRR. No murmurs, rubs, or gallops. No LE edema Pulmonary: Lungs CTAB. Normal effort. No wheezing or rales. Abdominal: Moderate abdominal distention and ascites. Mild generalized ttp. Normal bowel sounds. Extremities: Palpable radial and DP pulses. Normal ROM. Skin: Warm and dry. No obvious rash or lesions. Neuro: A&Ox3. Moves all extremities. Normal sensation to light touch. No focal deficit. Psych: Normal mood and affect          Labs on Admission:  Basic Metabolic Panel: Recent Labs  Lab 02/01/24 0431  NA 131*  K 4.8  CL 98  CO2 19*  GLUCOSE 119*  BUN 41*  CREATININE 1.44*  CALCIUM 8.7*  MG 2.6*   Liver Function Tests: Recent Labs  Lab 02/01/24 0431  AST 33  ALT 37  ALKPHOS 85  BILITOT 0.4  PROT 7.3  ALBUMIN 3.0*   Recent Labs  Lab 02/01/24 0431  LIPASE 106*   No results for input(s): "AMMONIA" in the last 168 hours. CBC: Recent Labs  Lab 02/01/24 0431  WBC 13.2*  HGB 18.1*  HCT 53.0*  MCV 90.0  PLT 319   Cardiac Enzymes: No results for input(s): "CKTOTAL", "CKMB", "CKMBINDEX", "TROPONINI" in the last 168 hours. BNP (last 3 results) No results for input(s): "BNP" in the last 8760 hours.  ProBNP (last 3 results) No results for input(s): "PROBNP" in the last 8760 hours.  CBG: No results for input(s): "GLUCAP" in the last 168 hours.  Radiological Exams on Admission: CT ABDOMEN PELVIS WO CONTRAST Result Date: 02/01/2024 CLINICAL DATA:  Abdominal pain, acute, nonlocalized. History of gastric cancer, metastatic. * Tracking Code: BO  * EXAM: CT ABDOMEN AND PELVIS WITHOUT CONTRAST TECHNIQUE: Multidetector CT imaging of the abdomen and pelvis was performed following the standard protocol without IV contrast. RADIATION DOSE REDUCTION: This exam was performed according to the departmental dose-optimization program which includes automated exposure control, adjustment of the mA and/or kV according to patient size and/or use of iterative reconstruction technique. COMPARISON:  January 07, 2024. FINDINGS: Evaluation is limited by lack of IV contrast. Lower chest: No acute abnormality. Similar appearance of bibasilar interstitial lung disease. Hepatobiliary: Gallbladder is distended. No extrahepatic biliary ductal dilation. No new focal hepatic abnormality. Pancreas: No new peripancreatic fat stranding. Spleen: Unremarkable. Adrenals/Urinary Tract: Adrenal glands are unremarkable. No hydronephrosis. Nonobstructing bilateral nephrolithiasis. Bladder is unremarkable. Stomach/Bowel: No evidence of bowel obstruction. Similar appearance of gastric wall thickening consistent with known malignancy. Vascular/Lymphatic: No aneurysmal dilation of the abdominal aorta. IVC filter. Similar shotty lymphadenopathy throughout the upper abdomen. Representative  porta hepatic lymph node measures 8 mm in the short axis, previously 5 mm (series 2, image 24). Infiltrative soft tissue throughout the upper abdomen consistent with peritoneal carcinomatosis. Reproductive: Prostate is unremarkable. Other: Moderate volume ascites. Revisualization of interdigitating soft tissue throughout the omentum, favored overall increased since prior PET CT. This is consistent with biopsy-proven peritoneal carcinomatosis. Fat containing inguinal hernias. Musculoskeletal: No acute or significant osseous findings. IMPRESSION: 1. No evidence of bowel obstruction. 2. Similar appearance of gastric wall thickening consistent with known malignancy. 3. Moderate volume ascites with peritoneal  carcinomatosis, favored overall increased since prior PET CT. 4. Nonobstructing bilateral nephrolithiasis. Electronically Signed   By: Meda Klinefelter M.D.   On: 02/01/2024 14:02   Assessment/Plan Suhas Estis is a 58 y.o. 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  # AKI # Mild hyponatremia # Metabolic acidosis Patient with recent diagnosis of stage IV stomach cancer presenting with progressive abdominal swelling and early satiety found to have AKI, metabolic acidosis and mild hyponatremia in the setting of dehydration and poor p.o. intake. S/p IV LR bolus in the ED -Increase IV LR infusion to 125 cc/h -Avoid nephrotoxic agents -Trend renal function  # Stage IV gastric cancer # Malignant ascites Diagnosed 2 months ago after an EGD with biopsy in [place] showed gastric carcinoma. Patient had repeat EGD last month with biopsy that showed poorly differentiated adenocarcinoma with signet rings cells. Patient subsequently developed abdominal ascites and peritoneal carcinomatosis.  He had his first paracentesis on 2/10 with 3.1 L out and significant improvement in his abdominal swelling.  Over the last 3 days, he has had return of the ascites with worsening abdominal distention and tightness. He is currently pending his 1st chemotherapy with FOLFOX on 2/17. Oncology has been consulted and plan to reschedule this appointment. -Oncology following, appreciate recs -US Paracentesis tomorrow -Pain control with oxycodone and IV Dilaudid -As needed Zofran for nausea and vomiting  # GERD -PPI  # HTN BP stable with SBP in the 130s to 140s.  DVT prophylaxis: Lovenox     Code Status: Full Code  Consults called: Oncology, IR  Family Communication: Discussed  admission with his pastor at bedside  Severity of Illness: The appropriate patient status for this patient is OBSERVATION. Observation status is judged to be reasonable and necessary in order to provide the required intensity of service to ensure the patient's safety. The patient's presenting symptoms, physical exam findings, and initial radiographic and laboratory data in the context of their medical condition is felt to place them at decreased risk for further clinical deterioration. Furthermore, it is anticipated that the patient will be medically stable for discharge from the hospital within 2 midnights of admission.   Level of care:  MedSurg  Henry Rainwater, MD 02/01/2024, 3:24 PM [facility] Pager: [phone] Isaiah 41:10   If 7PM-7AM, please contact night-coverage [email or web address] Password [email or web address]

## 2024-02-01 NOTE — ED Triage Notes (Signed)
Pt is here for ascites. Henry May he had fluid drained last Monday and now feels like he needs it again. Is a cancer patient.

## 2024-02-01 NOTE — ED Provider Notes (Signed)
[facility] EMERGENCY DEPARTMENT AT [facility] Provider Note   CSN: [id number] Arrival date & time: 02/01/24  3016     History  Chief Complaint  Patient presents with   Abdominal Pain    Henry May is a 58 y.o. male.   Abdominal Pain Patient presents for abdominal pain.  Medical history includes DM, HTN, DVT, gastric cancer.  He underwent his first paracentesis 6 days ago.  He was seen by oncology the following day.  Plan is for cycle 1 of FOLFOX tomorrow.  Since his recent paracentesis, he has had recurrence of swelling and abdominal discomfort.  He has had poor p.o. intake due to early satiety.  He denies any recent fevers or chills.  He is prescribed a pain pill, which she does not know the name of, that he states is ineffective.  Per chart review, he is prescribed tramadol.     Home Medications Prior to Admission medications   Medication Sig Start Date End Date Taking? Authorizing Provider  lidocaine-prilocaine (EMLA) cream Apply 1 Application topically as directed. Apply 1/2 tablespoon to port site 1-2 hours prior to stick and cover with Press-and-Seal to numb port site. Do not start until 14 days of port placement. 01/27/24   Ladene Artist, MD  omeprazole (PRILOSEC) 20 MG capsule Take 20 mg by mouth every 12 (twelve) hours.    [provider]  ondansetron (ZOFRAN) 8 MG tablet Take 1 tablet (8 mg total) by mouth every 8 (eight) hours as needed for nausea. May start 72 hours after chemotherapy treatment given due to receiving Aloxi on day 1 01/27/24   Ladene Artist, MD  prochlorperazine (COMPAZINE) 10 MG tablet Take 1 tablet (10 mg total) by mouth every 6 (six) hours as needed for nausea. 01/27/24   Ladene Artist, MD  traMADol (ULTRAM) 50 MG tablet Take 1 tablet (50 mg total) by mouth every 8 (eight) hours as needed. Do not drive while taking pain medication. 01/27/24   Rana Snare, NP      Allergies    Patient has no known allergies.    Review  of Systems   Review of Systems  Constitutional:  Positive for appetite change.  Gastrointestinal:  Positive for abdominal distention and abdominal pain.  All other systems reviewed and are negative.   Physical Exam Updated Vital Signs BP (!) 145/94   Pulse 87   Temp 97.7 F (36.5 C) (Oral)   Resp 16   SpO2 97%  Physical Exam Vitals and nursing note reviewed.  Constitutional:      General: He is not in acute distress.    Appearance: He is well-developed. He is not ill-appearing, toxic-appearing or diaphoretic.  HENT:     Head: Normocephalic and atraumatic.     Mouth/Throat:     Mouth: Mucous membranes are moist.  Eyes:     Conjunctiva/sclera: Conjunctivae normal.  Cardiovascular:     Rate and Rhythm: Normal rate and regular rhythm.  Pulmonary:     Effort: Pulmonary effort is normal. No respiratory distress.  Abdominal:     Palpations: Abdomen is soft.     Tenderness: There is no abdominal tenderness.  Musculoskeletal:        General: No swelling.     Cervical back: Neck supple.  Skin:    General: Skin is warm and dry.     Coloration: Skin is not cyanotic or jaundiced.  Neurological:     General: No focal deficit present.  Mental Status: He is alert and oriented to person, place, and time.  Psychiatric:        Mood and Affect: Mood normal.        Behavior: Behavior normal.     ED Results / Procedures / Treatments   Labs (all labs ordered are listed, but only abnormal results are displayed) Labs Reviewed  LIPASE, BLOOD - Abnormal; Notable for the following components:      Result Value   Lipase 106 (*)    All other components within normal limits  COMPREHENSIVE METABOLIC PANEL - Abnormal; Notable for the following components:   Sodium 131 (*)    CO2 19 (*)    Glucose, Bld 119 (*)    BUN 41 (*)    Creatinine, Ser 1.44 (*)    Calcium 8.7 (*)    Albumin 3.0 (*)    GFR, Estimated 56 (*)    All other components within normal limits  CBC - Abnormal; Notable  for the following components:   WBC 13.2 (*)    RBC 5.89 (*)    Hemoglobin 18.1 (*)    HCT 53.0 (*)    All other components within normal limits  URINALYSIS, ROUTINE W REFLEX MICROSCOPIC - Abnormal; Notable for the following components:   Color, Urine AMBER (*)    APPearance HAZY (*)    All other components within normal limits  MAGNESIUM - Abnormal; Notable for the following components:   Magnesium 2.6 (*)    All other components within normal limits    EKG None  Radiology CT ABDOMEN PELVIS WO CONTRAST Result Date: 02/01/2024 CLINICAL DATA:  Abdominal pain, acute, nonlocalized. History of gastric cancer, metastatic. * Tracking Code: BO * EXAM: CT ABDOMEN AND PELVIS WITHOUT CONTRAST TECHNIQUE: Multidetector CT imaging of the abdomen and pelvis was performed following the standard protocol without IV contrast. RADIATION DOSE REDUCTION: This exam was performed according to the departmental dose-optimization program which includes automated exposure control, adjustment of the mA and/or kV according to patient size and/or use of iterative reconstruction technique. COMPARISON:  January 07, 2024. FINDINGS: Evaluation is limited by lack of IV contrast. Lower chest: No acute abnormality. Similar appearance of bibasilar interstitial lung disease. Hepatobiliary: Gallbladder is distended. No extrahepatic biliary ductal dilation. No new focal hepatic abnormality. Pancreas: No new peripancreatic fat stranding. Spleen: Unremarkable. Adrenals/Urinary Tract: Adrenal glands are unremarkable. No hydronephrosis. Nonobstructing bilateral nephrolithiasis. Bladder is unremarkable. Stomach/Bowel: No evidence of bowel obstruction. Similar appearance of gastric wall thickening consistent with known malignancy. Vascular/Lymphatic: No aneurysmal dilation of the abdominal aorta. IVC filter. Similar shotty lymphadenopathy throughout the upper abdomen. Representative porta hepatic lymph node measures 8 mm in the short axis,  previously 5 mm (series 2, image 24). Infiltrative soft tissue throughout the upper abdomen consistent with peritoneal carcinomatosis. Reproductive: Prostate is unremarkable. Other: Moderate volume ascites. Revisualization of interdigitating soft tissue throughout the omentum, favored overall increased since prior PET CT. This is consistent with biopsy-proven peritoneal carcinomatosis. Fat containing inguinal hernias. Musculoskeletal: No acute or significant osseous findings. IMPRESSION: 1. No evidence of bowel obstruction. 2. Similar appearance of gastric wall thickening consistent with known malignancy. 3. Moderate volume ascites with peritoneal carcinomatosis, favored overall increased since prior PET CT. 4. Nonobstructing bilateral nephrolithiasis. Electronically Signed   By: Meda Klinefelter M.D.   On: 02/01/2024 14:02    Procedures Procedures    Medications Ordered in ED Medications  lactated ringers infusion (has no administration in time range)  lactated ringers bolus 1,000 mL (0  mLs Intravenous Stopped 02/01/24 1017)    ED Course/ Medical Decision Making/ A&P                                 Medical Decision Making Amount and/or Complexity of Data Reviewed Labs: ordered. Radiology: ordered.  Risk Prescription drug management. Decision regarding hospitalization.   This patient presents to the ED for concern of abdominal distention, this involves an extensive number of treatment options, and is a complaint that carries with it a high risk of complications and morbidity.  The differential diagnosis includes ascites, SBO, SBP, constipation   Co morbidities that complicate the patient evaluation  DM, HTN, DVT, gastric cancer   Additional history obtained:  Additional history obtained from patient's wife External records from outside source obtained and reviewed including EMR   Lab Tests:  I Ordered, and personally interpreted labs.  The pertinent results include:  Elevated hemoglobin and leukocytosis consistent with hemoconcentration; increased creatinine from baseline.  Azotemia suggest prerenal etiology.  Non-anion gap metabolic acidosis is present.   Imaging Studies ordered:  I ordered imaging studies including CT of abdomen and pelvis I independently visualized and interpreted imaging which showed no acute findings I agree with the radiologist interpretation   Cardiac Monitoring: / EKG:  The patient was maintained on a cardiac monitor.  I personally viewed and interpreted the cardiac monitored which showed an underlying rhythm of: Sinus rhythm   Consultations Obtained:  I requested consultation with the oncologist, Dr. Arlana Pouch,  and discussed lab and imaging findings as well as pertinent plan - they recommend: Admission for IV fluids, paracentesis.  Will reschedule his initial chemotherapy.   Problem List / ED Course / Critical interventions / Medication management  Patient presents for abdominal distention and discomfort.  He has known gastric cancer and malignant ascites.  He underwent his first paracentesis 6 days ago but feels that he has had recurrence of abdominal distention and discomfort.  Pain is worsened with movements.  He denies any pleurisy or shortness of breath.  He was told by his oncologist to come to the ER if he does have recurrence of discomfort.  Vital signs on arrival are normal.  Patient is overall well-appearing on exam.  His abdomen is not overly distended.  He does not have any significant abdominal tenderness.  Given his poor p.o. intake, IV fluids were ordered.  Patient declines any pain medication at this time.  Workup was initiated.  Initial lab work notable for AKI.  Noncontrasted CT scan was ordered.  I spoke with oncologist on-call, Dr. Arlana Pouch, who came and saw the patient.  Oncology does prefer optimization of kidney function prior to initiation of FOLFOX.  Plan to be for admission for IV fluids and paracentesis in the  morning.  Patient is agreeable to this plan.  Patient to be admitted for further management. I ordered medication including fluids for hydration Reevaluation of the patient after these medicines showed that the patient improved I have reviewed the patients home medicines and have made adjustments as needed   Social Determinants of Health:  Has access to outpatient care        Final Clinical Impression(s) / ED Diagnoses Final diagnoses:  AKI (acute kidney injury) (HCC)  Malignant ascites    Rx / DC Orders ED Discharge Orders     None         Gloris Manchester, MD 02/01/24 1513

## 2024-02-02 ENCOUNTER — Inpatient Hospital Stay: Payer: No Typology Code available for payment source

## 2024-02-02 ENCOUNTER — Observation Stay (HOSPITAL_COMMUNITY): Payer: No Typology Code available for payment source

## 2024-02-02 DIAGNOSIS — N179 Acute kidney failure, unspecified: Secondary | ICD-10-CM | POA: Diagnosis not present

## 2024-02-02 DIAGNOSIS — K121 Other forms of stomatitis: Secondary | ICD-10-CM | POA: Diagnosis present

## 2024-02-02 DIAGNOSIS — B370 Candidal stomatitis: Secondary | ICD-10-CM | POA: Diagnosis present

## 2024-02-02 DIAGNOSIS — Z87891 Personal history of nicotine dependence: Secondary | ICD-10-CM

## 2024-02-02 DIAGNOSIS — K1230 Oral mucositis (ulcerative), unspecified: Secondary | ICD-10-CM | POA: Diagnosis present

## 2024-02-02 DIAGNOSIS — Z1152 Encounter for screening for COVID-19: Secondary | ICD-10-CM

## 2024-02-02 DIAGNOSIS — G9341 Metabolic encephalopathy: Secondary | ICD-10-CM | POA: Diagnosis present

## 2024-02-02 DIAGNOSIS — I110 Hypertensive heart disease with heart failure: Secondary | ICD-10-CM | POA: Diagnosis present

## 2024-02-02 DIAGNOSIS — Z85028 Personal history of other malignant neoplasm of stomach: Secondary | ICD-10-CM

## 2024-02-02 DIAGNOSIS — Z86718 Personal history of other venous thrombosis and embolism: Secondary | ICD-10-CM

## 2024-02-02 DIAGNOSIS — Z781 Physical restraint status: Secondary | ICD-10-CM

## 2024-02-02 DIAGNOSIS — K5900 Constipation, unspecified: Secondary | ICD-10-CM | POA: Diagnosis present

## 2024-02-02 DIAGNOSIS — K649 Unspecified hemorrhoids: Secondary | ICD-10-CM | POA: Diagnosis present

## 2024-02-02 DIAGNOSIS — R7401 Elevation of levels of liver transaminase levels: Secondary | ICD-10-CM | POA: Diagnosis present

## 2024-02-02 DIAGNOSIS — C162 Malignant neoplasm of body of stomach: Principal | ICD-10-CM | POA: Diagnosis present

## 2024-02-02 DIAGNOSIS — R4586 Emotional lability: Secondary | ICD-10-CM | POA: Diagnosis present

## 2024-02-02 DIAGNOSIS — E876 Hypokalemia: Secondary | ICD-10-CM | POA: Diagnosis present

## 2024-02-02 DIAGNOSIS — E875 Hyperkalemia: Secondary | ICD-10-CM | POA: Diagnosis present

## 2024-02-02 DIAGNOSIS — R112 Nausea with vomiting, unspecified: Secondary | ICD-10-CM | POA: Diagnosis present

## 2024-02-02 DIAGNOSIS — R180 Malignant ascites: Secondary | ICD-10-CM | POA: Diagnosis not present

## 2024-02-02 DIAGNOSIS — R944 Abnormal results of kidney function studies: Secondary | ICD-10-CM | POA: Diagnosis present

## 2024-02-02 DIAGNOSIS — Z8701 Personal history of pneumonia (recurrent): Secondary | ICD-10-CM

## 2024-02-02 DIAGNOSIS — R443 Hallucinations, unspecified: Secondary | ICD-10-CM | POA: Diagnosis present

## 2024-02-02 DIAGNOSIS — E871 Hypo-osmolality and hyponatremia: Secondary | ICD-10-CM | POA: Diagnosis present

## 2024-02-02 DIAGNOSIS — N170 Acute kidney failure with tubular necrosis: Secondary | ICD-10-CM | POA: Diagnosis present

## 2024-02-02 DIAGNOSIS — R9431 Abnormal electrocardiogram [ECG] [EKG]: Secondary | ICD-10-CM | POA: Diagnosis present

## 2024-02-02 DIAGNOSIS — Z95828 Presence of other vascular implants and grafts: Secondary | ICD-10-CM

## 2024-02-02 DIAGNOSIS — Z8616 Personal history of COVID-19: Secondary | ICD-10-CM

## 2024-02-02 DIAGNOSIS — Z9181 History of falling: Secondary | ICD-10-CM

## 2024-02-02 DIAGNOSIS — J189 Pneumonia, unspecified organism: Secondary | ICD-10-CM | POA: Diagnosis present

## 2024-02-02 DIAGNOSIS — C786 Secondary malignant neoplasm of retroperitoneum and peritoneum: Secondary | ICD-10-CM | POA: Diagnosis present

## 2024-02-02 DIAGNOSIS — K219 Gastro-esophageal reflux disease without esophagitis: Secondary | ICD-10-CM | POA: Diagnosis present

## 2024-02-02 DIAGNOSIS — E861 Hypovolemia: Secondary | ICD-10-CM | POA: Diagnosis present

## 2024-02-02 DIAGNOSIS — Z87442 Personal history of urinary calculi: Secondary | ICD-10-CM

## 2024-02-02 LAB — CBC
HCT: 51.3 % (ref 39.0–52.0)
Hemoglobin: 16.6 g/dL (ref 13.0–17.0)
MCH: 30.1 pg (ref 26.0–34.0)
MCHC: 32.4 g/dL (ref 30.0–36.0)
MCV: 93.1 fL (ref 80.0–100.0)
Platelets: 326 10*3/uL (ref 150–400)
RBC: 5.51 MIL/uL (ref 4.22–5.81)
RDW: 12.3 % (ref 11.5–15.5)
WBC: 12 10*3/uL — ABNORMAL HIGH (ref 4.0–10.5)
nRBC: 0 % (ref 0.0–0.2)

## 2024-02-02 LAB — BASIC METABOLIC PANEL
Anion gap: 12 (ref 5–15)
Anion gap: 8 (ref 5–15)
BUN: 37 mg/dL — ABNORMAL HIGH (ref 6–20)
BUN: 33 mg/dL — ABNORMAL HIGH (ref 6–20)
CO2: 21 mmol/L — ABNORMAL LOW (ref 22–32)
CO2: 24 mmol/L (ref 22–32)
Calcium: 8.5 mg/dL — ABNORMAL LOW (ref 8.9–10.3)
Calcium: 8.1 mg/dL — ABNORMAL LOW (ref 8.9–10.3)
Chloride: 98 mmol/L (ref 98–111)
Chloride: 99 mmol/L (ref 98–111)
Creatinine, Ser: 1.46 mg/dL — ABNORMAL HIGH (ref 0.61–1.24)
Creatinine, Ser: 1.12 mg/dL (ref 0.61–1.24)
GFR, Estimated: 55 mL/min — ABNORMAL LOW (ref >60)
GFR, Estimated: >60 mL/min (ref >60)
Glucose, Bld: 121 mg/dL — ABNORMAL HIGH (ref 70–99)
Glucose, Bld: 127 mg/dL — ABNORMAL HIGH (ref 70–99)
Potassium: 4.8 mmol/L (ref 3.5–5.1)
Potassium: 4.8 mmol/L (ref 3.5–5.1)
Sodium: 131 mmol/L — ABNORMAL LOW (ref 135–145)
Sodium: 131 mmol/L — ABNORMAL LOW (ref 135–145)

## 2024-02-02 LAB — HIV ANTIBODY (ROUTINE TESTING W REFLEX): HIV Screen 4th Generation wRfx: NONREACTIVE

## 2024-02-02 MED ORDER — LIDOCAINE HCL 1 % IJ SOLN
INTRAMUSCULAR | Status: AC
  Filled 2024-02-02: qty 20

## 2024-02-02 MED ORDER — ALBUMIN HUMAN 5 % IV SOLN: 12.5000 g | Freq: Once | INTRAVENOUS | Status: DC

## 2024-02-02 MED ORDER — HEPARIN SOD (PORK) LOCK FLUSH 100 UNIT/ML IV SOLN
500.0000 [IU] | Freq: Once | INTRAVENOUS | Status: AC
  Administered 2024-02-02: 500 [IU] via INTRAVENOUS
  Filled 2024-02-02: qty 5

## 2024-02-02 MED ORDER — ALBUMIN HUMAN 25 % IV SOLN
25.0000 g | Freq: Once | INTRAVENOUS | Status: AC
  Administered 2024-02-02: 25 g via INTRAVENOUS
  Filled 2024-02-02: qty 100

## 2024-02-02 MED ORDER — OXYCODONE HCL 5 MG PO TABS: 5.0000 mg | ORAL_TABLET | ORAL | Status: DC | PRN

## 2024-02-02 NOTE — Plan of Care (Addendum)
Plan of care is reviewed. Pt is alert and fully oriented x 4, able to ambulate independently in his room, no acute distress, vital signs stable, afebrile, normal respiratory effort.   He has complaints of abdominal pain, distention, bloating and request sleeping aids. The on-call provider, Dr. Chinita Greenland, ARPN is notified. We receive order for simethicone PO and melatonin.    Pt reports he has very low appetite and can only eat mostly liquid.  LBM is on 01/30/24. Denies nausea or vomiting. Continue IV hydration with LR at 125 ml/hr. We will continue to monitor.   Problem: Clinical Measurements: Goal: Ability to maintain clinical measurements within normal limits will improve Outcome: Progressing Goal: Will remain free from infection Outcome: Progressing Goal: Diagnostic test results will improve Outcome: Progressing Goal: Respiratory complications will improve Outcome: Progressing Goal: Cardiovascular complication will be avoided Outcome: Progressing   Problem: Nutrition: Goal: Adequate nutrition will be maintained Outcome: Progressing   Problem: Activity: Goal: Risk for activity intolerance will decrease Outcome: Progressing   Problem: Coping: Goal: Level of anxiety will decrease Outcome: Progressing   Problem: Elimination: Goal: Will not experience complications related to bowel motility Outcome: Progressing Goal: Will not experience complications related to urinary retention Outcome: Progressing   Problem: Pain Managment: Goal: General experience of comfort will improve and/or be controlled Outcome: Progressing   Henry Pinks, RN

## 2024-02-02 NOTE — Discharge Summary (Addendum)
Physician Discharge Summary   Patient: Henry May MRN: [id number] DOB: 06-21-1966  Admit date:     02/01/2024  Discharge date: 02/02/24  Discharge Physician: Meredeth Ide   PCP: Tiffany Kocher, DO   Recommendations at discharge:   Follow-up PCP in 1 week Follow-up oncology for chemotherapy  Discharge Diagnoses: Principal Problem:   AKI (acute kidney injury) (HCC) Active Problems:   Malignant ascites  Resolved Problems:   * No resolved hospital problems. *  Hospital Course: 58 y.o. 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 Patient reports she had a paracentesis with removal of 3 L last Monday. He has been doing well since then until the last few days where his abdominal swelling has gradually worsened.  He also reports narrowing of his esophagus so he is not able to eat very fast or drink as much as he wants to. He often has to drink water very slowly.  Over the last few days, he has had very little water and food to eat due to early satiety. He reports abdominal tightness but denies any nausea, vomiting, dysuria, fevers, chills, shortness of breath or chest pain.   Assessment and Plan:  Henry May is a 58 y.o. 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   # AKI # Mild hyponatremia # Metabolic acidosis -Resolved with IV fluids, IV albumin  # Stage IV gastric cancer # Malignant ascites Diagnosed 2 months ago after an EGD with biopsy in [place] showed gastric carcinoma. Patient had repeat EGD last month with biopsy that showed poorly  differentiated adenocarcinoma with signet rings cells. Patient subsequently developed abdominal ascites and peritoneal carcinomatosis.  He had his first paracentesis on 2/10 with 3.1 L out and significant improvement in his abdominal swelling.  Over the last 3 days, he has had return of the ascites with worsening abdominal distention and tightness. He is currently pending his 1st chemotherapy with FOLFOX on 2/17.  -Underwent paracentesis today yielding 4.4 L of ascitic fluid -Will give 25 g of 25% albumin x 1    Discussed with Dr. Truett Perna, will discharge home       Consultants:  Procedures performed: Paracentesis Disposition: Home Diet recommendation:  Discharge Diet Orders (From admission, onward)     Start     Ordered   02/02/24 0000  Diet - low sodium heart healthy        02/02/24 1751           Regular diet DISCHARGE MEDICATION: Allergies as of 02/02/2024   No Known Allergies      Medication List     TAKE these medications    lidocaine-prilocaine cream Commonly known as: EMLA Apply 1 Application topically as directed. Apply 1/2 tablespoon to port site 1-2 hours prior to stick and cover with Press-and-Seal to numb port site. Do not start until 14 days of port placement.   omeprazole 20 MG capsule Commonly known as: PRILOSEC Take 20 mg by mouth daily.   ondansetron 8 MG tablet Commonly known as: ZOFRAN Take 1 tablet (8 mg total) by mouth every 8 (eight) hours as needed for nausea. May start 72 hours after chemotherapy treatment given  due to receiving Aloxi on day 1   prochlorperazine 10 MG tablet Commonly known as: COMPAZINE Take 1 tablet (10 mg total) by mouth every 6 (six) hours as needed for nausea.   traMADol 50 MG tablet Commonly known as: ULTRAM Take 1 tablet (50 mg total) by mouth every 8 (eight) hours as needed. Do not drive while taking pain medication.        Follow-up Information     Tiffany Kocher, DO Follow up in 1 week(s).   Specialty:  Family Medicine Contact information: [address] [place] [address] [phone]                Discharge Exam: There were no vitals filed for this visit. General-appears in no acute distress Heart-S1-S2, regular, no murmur auscultated Lungs-clear to auscultation bilaterally, no wheezing or crackles auscultated Abdomen-soft, nontender, no organomegaly Extremities-no edema in the lower extremities Neuro-alert, oriented x3, no focal deficit noted  Condition at discharge: good  The results of significant diagnostics from this hospitalization (including imaging, microbiology, ancillary and laboratory) are listed below for reference.   Imaging Studies: US Paracentesis Result Date: 02/02/2024 INDICATION: 58 year old male with a history of gastric cancer with recurrent malignant ascites. EXAM: ULTRASOUND GUIDED RIGHT PARACENTESIS MEDICATIONS: 8 mL 1% Lidocaine COMPLICATIONS: None immediate. PROCEDURE: Informed written consent was obtained from the patient after a discussion of the risks, benefits and alternatives to treatment. A timeout was performed prior to the initiation of the procedure. Initial ultrasound scanning demonstrates a large amount of ascites within the right lower abdominal quadrant. The right lower abdomen was prepped and draped in the usual sterile fashion. 1% lidocaine was used for local anesthesia. Following this, a 19 gauge, 7-cm, Yueh catheter was introduced. An ultrasound image was saved for documentation purposes. The paracentesis was performed. The catheter was removed and a dressing was applied. The patient tolerated the procedure well without immediate post procedural complication. FINDINGS: A total of approximately 4.4 L of clear yellow fluid was removed. Samples were sent to the laboratory as requested by the clinical team. IMPRESSION: Successful ultrasound-guided paracentesis yielding 4.4 liters of peritoneal fluid. PLAN: If the patient eventually requires  >/=2 paracenteses in a 30 day period, candidacy for formal evaluation by the [facility] will be assessed. Performed By Theresa Mulligan, PA-C Electronically Signed   By: Corlis Leak M.D.   On: 02/02/2024 10:55   CT ABDOMEN PELVIS WO CONTRAST Result Date: 02/01/2024 CLINICAL DATA:  Abdominal pain, acute, nonlocalized. History of gastric cancer, metastatic. * Tracking Code: BO * EXAM: CT ABDOMEN AND PELVIS WITHOUT CONTRAST TECHNIQUE: Multidetector CT imaging of the abdomen and pelvis was performed following the standard protocol without IV contrast. RADIATION DOSE REDUCTION: This exam was performed according to the departmental dose-optimization program which includes automated exposure control, adjustment of the mA and/or kV according to patient size and/or use of iterative reconstruction technique. COMPARISON:  January 07, 2024. FINDINGS: Evaluation is limited by lack of IV contrast. Lower chest: No acute abnormality. Similar appearance of bibasilar interstitial lung disease. Hepatobiliary: Gallbladder is distended. No extrahepatic biliary ductal dilation. No new focal hepatic abnormality. Pancreas: No new peripancreatic fat stranding. Spleen: Unremarkable. Adrenals/Urinary Tract: Adrenal glands are unremarkable. No hydronephrosis. Nonobstructing bilateral nephrolithiasis. Bladder is unremarkable. Stomach/Bowel: No evidence of bowel obstruction. Similar appearance of gastric wall thickening consistent with known malignancy. Vascular/Lymphatic: No aneurysmal dilation of the abdominal aorta. IVC filter. Similar shotty lymphadenopathy throughout the upper abdomen. Representative porta hepatic lymph  node measures 8 mm in the short axis, previously 5 mm (series 2, image 24). Infiltrative soft tissue throughout the upper abdomen consistent with peritoneal carcinomatosis. Reproductive: Prostate is unremarkable. Other: Moderate volume ascites. Revisualization of  interdigitating soft tissue throughout the omentum, favored overall increased since prior PET CT. This is consistent with biopsy-proven peritoneal carcinomatosis. Fat containing inguinal hernias. Musculoskeletal: No acute or significant osseous findings. IMPRESSION: 1. No evidence of bowel obstruction. 2. Similar appearance of gastric wall thickening consistent with known malignancy. 3. Moderate volume ascites with peritoneal carcinomatosis, favored overall increased since prior PET CT. 4. Nonobstructing bilateral nephrolithiasis. Electronically Signed   By: Meda Klinefelter M.D.   On: 02/01/2024 14:02   US Paracentesis Result Date: 01/26/2024 INDICATION: Patient with history of gastric cancer with recurrent malignant ascites. Request received for diagnostic and therapeutic paracentesis up to 4 liters. EXAM: ULTRASOUND GUIDED DIAGNOSTIC AND THERAPEUTIC PARACENTESIS MEDICATIONS: 8 mL 1% lidocaine COMPLICATIONS: None immediate. PROCEDURE: Informed written consent was obtained from the patient after a discussion of the risks, benefits and alternatives to treatment. A timeout was performed prior to the initiation of the procedure. Initial ultrasound scanning demonstrates a moderate amount of ascites within the right lower abdominal quadrant. The right lower abdomen was prepped and draped in the usual sterile fashion. 1% lidocaine was used for local anesthesia. Following this, a 19 gauge, 7-cm, Yueh catheter was introduced. An ultrasound image was saved for documentation purposes. The paracentesis was performed. The catheter was removed and a dressing was applied. The patient tolerated the procedure well without immediate post procedural complication. FINDINGS: A total of approximately 3.1 liters of slightly hazy, yellow fluid was removed. Samples were sent to the laboratory as requested by the clinical team. IMPRESSION: Successful ultrasound-guided paracentesis yielding 3.1 liters of peritoneal fluid. Performed by:  Artemio Aly Electronically Signed   By: Corlis Leak M.D.   On: 01/26/2024 12:43   IR Paracentesis Result Date: 01/22/2024 INDICATION: 58 year old with gastric cancer and evidence for peritoneal disease. Patient has developed a large volume of ascites EXAM: ULTRASOUND GUIDED DIAGNOSTIC AND THERAPEUTIC PARACENTESIS MEDICATIONS: None. COMPLICATIONS: None immediate. PROCEDURE: Informed written consent was obtained from the patient after a discussion of the risks, benefits and alternatives to treatment. A timeout was performed prior to the initiation of the procedure. Initial ultrasound scanning demonstrates a large amount of ascites within the right upper abdominal quadrant. The right lower abdomen was prepped and draped in the usual sterile fashion. 1% lidocaine was used for local anesthesia. Following this, a 6 Fr Safe-T-Centesis catheter was introduced. An ultrasound image was saved for documentation purposes. The paracentesis was performed. The catheter was removed and a dressing was applied. The patient tolerated the procedure well without immediate post procedural complication. FINDINGS: Largest pocket of fluid was in the right upper abdomen around the liver. A total of approximately 2.9 L of yellow fluid was removed. Samples were sent to the laboratory as requested by the clinical team. IMPRESSION: Successful ultrasound-guided paracentesis yielding 2.9 liters of peritoneal fluid. Electronically Signed   By: Richarda Overlie M.D.   On: 01/22/2024 10:46   IR IMAGING GUIDED PORT INSERTION Result Date: 01/22/2024 INDICATION: 58 year old with gastric cancer. Patient needs durable venous access for treatment. EXAM: FLUOROSCOPIC AND ULTRASOUND GUIDED PLACEMENT OF A SUBCUTANEOUS PORT COMPARISON:  None Available. MEDICATIONS: Moderate sedation ANESTHESIA/SEDATION: Moderate (conscious) sedation was employed during this procedure. A total of Versed 1 mg and fentanyl 75 mcg was administered intravenously at the order of  the provider performing the  procedure. Total intra-service moderate sedation time: 26 minutes. Patient's level of consciousness and vital signs were monitored continuously by radiology nurse throughout the procedure under the supervision of the provider performing the procedure. FLUOROSCOPY TIME:  Radiation Exposure Index (as provided by the fluoroscopic device): 4 mGy Kerma COMPLICATIONS: None immediate. PROCEDURE: The procedure, risks, benefits, and alternatives were explained to the patient. Questions regarding the procedure were encouraged and answered. The patient understands and consents to the procedure. Patient was placed supine on the interventional table. Ultrasound confirmed a patent right internal jugular vein. Ultrasound image was saved for documentation. The right chest and neck were cleaned with a skin antiseptic and a sterile drape was placed. Maximal barrier sterile technique was utilized including caps, mask, sterile gowns, sterile gloves, sterile drape, hand hygiene and skin antiseptic. The right neck was anesthetized with 1% lidocaine. Small incision was made in the right neck with a blade. Micropuncture set was placed in the right internal jugular vein with ultrasound guidance. The micropuncture wire was used for measurement purposes. The right chest was anesthetized with 1% lidocaine with epinephrine. #15 blade was used to make an incision and a subcutaneous port pocket was formed. 8 french Power Port was assembled. Subcutaneous tunnel was formed with a stiff tunneling device. The port catheter was brought through the subcutaneous tunnel. The port was placed in the subcutaneous pocket. The micropuncture set was exchanged for a peel-away sheath. The catheter was placed through the peel-away sheath and the tip was positioned at the superior cavoatrial junction. Catheter placement was confirmed with fluoroscopy. The port was accessed and flushed with heparinized saline. The port pocket was closed  using two layers of absorbable sutures and Dermabond. The vein skin site was closed using a single layer of absorbable suture and Dermabond. Sterile dressings were applied. Patient tolerated the procedure well without an immediate complication. Ultrasound and fluoroscopic images were taken and saved for this procedure. IMPRESSION: Placement of a subcutaneous power-injectable port device. Catheter tip at the superior cavoatrial junction. Electronically Signed   By: Richarda Overlie M.D.   On: 01/22/2024 10:43   CT ABDOMINAL MASS BIOPSY Result Date: 01/22/2024 INDICATION: 58 year old with gastric cancer. Request for peritoneal biopsy to stage the disease. EXAM: CT-GUIDED BIOPSY OF OMENTAL/PERITONEAL THICKENING TECHNIQUE: Multidetector CT imaging of the abdomen was performed following the standard protocol with/without IV contrast. RADIATION DOSE REDUCTION: This exam was performed according to the departmental dose-optimization program which includes automated exposure control, adjustment of the mA and/or kV according to patient size and/or use of iterative reconstruction technique. MEDICATIONS: Moderate sedation ANESTHESIA/SEDATION: Moderate (conscious) sedation was employed during this procedure. A total of Versed 1 mg and Fentanyl 75 mcg was administered intravenously by the radiology nurse. Total intra-service moderate Sedation Time: 15 minutes. The patient's level of consciousness and vital signs were monitored continuously by radiology nursing throughout the procedure under my direct supervision. COMPLICATIONS: None immediate. PROCEDURE: Informed written consent was obtained from the patient after a thorough discussion of the procedural risks, benefits and alternatives. All questions were addressed. A timeout was performed prior to the initiation of the procedure. CT images through the abdomen were obtained. Extensive omental/peritoneal thickening in the anterior abdomen was identified and targeted for biopsy. The  left anterior abdomen was prepped with chlorhexidine and sterile field was created. Skin was anesthetized with 1% lidocaine. Small incision was made. Using CT guidance, a 17 gauge coaxial needle was directed into the left anterior peritoneal thickening. Core biopsies were obtained with an 18 gauge  device. Specimens placed in formalin. Needle was removed without complication. Bandage placed over the puncture site. FINDINGS: CT images of the abdomen demonstrate progression of peritoneal disease since 01/07/2024. The patient has now developed increased ascites particularly in the perihepatic region. In addition, there is markedly increased omental/peritoneal thickening in the anterior abdomen. Biopsy needle was directed into the left anterior abdominal peritoneal disease. No immediate bleeding or hematoma formation. IMPRESSION: 1. CT-guided biopsy of omental/peritoneal thickening. 2. Progression of peritoneal disease demonstrated by increased peritoneal thickening and ascites. Electronically Signed   By: Richarda Overlie M.D.   On: 01/22/2024 10:42   US THYROID Result Date: 01/15/2024 CLINICAL DATA:  Incidental on PET. Increased metabolic activity by PET throughout much of the right lobe and possibly extending posterior to the right lobe. EXAM: THYROID ULTRASOUND TECHNIQUE: Ultrasound examination of the thyroid gland and adjacent soft tissues was performed. COMPARISON:  PET scan on 01/07/2024 FINDINGS: Parenchymal Echotexture: Normal Isthmus: 0.7 cm Right lobe: 5.1 x 1.8 x 2.4 cm Left lobe: 4.0 x 1.4 x 1.2 cm _________________________________________________________ Estimated total number of nodules >/= 1 cm: 1 Number of spongiform nodules >/=  2 cm not described below (TR1): 0 Number of mixed cystic and solid nodules >/= 1.5 cm not described below (TR2): 0 _________________________________________________________ Nodule # 1: Location: Isthmus Maximum size: 1.1 cm; Other 2 dimensions: 1.1 x 0.7 cm Composition:  solid/almost completely solid (2) Echogenicity: hypoechoic (2) Shape: not taller-than-wide (0) Margins: smooth (0) Echogenic foci: none (0) ACR TI-RADS total points: 4. ACR TI-RADS risk category: TR4 (4-6 points). ACR TI-RADS recommendations: *Given size (>/= 1 - 1.4 cm) and appearance, a follow-up ultrasound in 1 year should be considered based on TI-RADS criteria. _________________________________________________________ No abnormal nodule is identified in the right lobe. There is no identifiable abnormal parathyroid soft tissue nodules. No enlarged or abnormal appearing lymph nodes are identified. IMPRESSION: 1. 1.1 cm thyroid isthmus nodule meets criteria for 1 year follow-up ultrasound. 2. No sonographic correlate is identified for the increased metabolic activity within the right lobe or potentially extending posterior to the right lobe. There is no evidence of an obvious parathyroid adenoma by ultrasound. The above is in keeping with the ACR TI-RADS recommendations - J Am Coll Radiol 2017;14:587-595. Electronically Signed   By: Irish Lack M.D.   On: 01/15/2024 13:36   NM PET Image Initial (PI) Skull Base To Thigh (F-18 FDG) Result Date: 01/08/2024 CLINICAL DATA:  Malignant gastric tumor of the stomach. Initial treatment strategy. EXAM: NUCLEAR MEDICINE PET SKULL BASE TO THIGH TECHNIQUE: 10.8 mCi F-18 FDG was injected intravenously. Full-ring PET imaging was performed from the skull base to thigh after the radiotracer. CT data was obtained and used for attenuation correction and anatomic localization. Fasting blood glucose: 113 mg/dl COMPARISON:  Outside CT 11/28/2023 FINDINGS: NECK: Intense hypermetabolic activity localizes to the RIGHT lobe of thyroid gland with SUV max equal 7.7 on image 52. There is posterior nodular extension of the RIGHT lobe of the gland with a 15 mm nodule adjacent to the esophagus with equal intensity radiotracer activity Incidental CT findings: None. CHEST: Moderate  radiotracer activity associated small RIGHT axillary lymph node with max SUV max equal 4.0 on image 77. No paraesophageal metabolic adenopathy. No suspicious pulmonary nodules. Incidental CT findings: Mild chronic interstitial lung disease noted. ABDOMEN/PELVIS: There circumferential gastric mucosal thickening involving the gastric body and antrum. This circumferential thickening is diffusely hypermetabolic SUV max equal 7.0 on image 101 for example. There is ill-defined hypermetabolic lymph nodes in  the gastrohepatic ligament with SUV max equal 6.8 on image 105. No discrete measurable nodes. There is haziness within the gastrohepatic ligament peritoneal fat (image 105/4). No abnormal metabolic activity in the liver. Hypermetabolic abdominopelvic lymph nodes. Incidental CT findings: . Uterus and adnexa unremarkable. Infrarenal IVC filter in place. SKELETON: No focal hypermetabolic activity to suggest skeletal metastasis. Incidental CT findings: None. IMPRESSION: 1. Circumferential hypermetabolic gastric mucosal thickening consistent with primary gastric adenocarcinoma. 2. Ill-defined hypermetabolic lymph nodes in the gastrohepatic ligament concerning for with local nodal metastasis. 3. No evidence of liver metastasis. 4. Intense hypermetabolic activity associated with the RIGHT lobe of thyroid gland. Favor primary thyroid neoplasm. Recommend ultrasound of the thyroid gland and fine needle aspiration. 5. Small hypermetabolic RIGHT axillary lymph node is favored reactive. Electronically Signed   By: Genevive Bi M.D.   On: 01/08/2024 16:20    Microbiology: Results for orders placed or performed during the hospital encounter of 11/10/20  SARS CORONAVIRUS 2 (TAT 6-24 HRS) Nasopharyngeal Nasopharyngeal Swab     Status: None   Collection Time: 11/10/20  9:09 AM   Specimen: Nasopharyngeal Swab  Result Value Ref Range Status   SARS Coronavirus 2 NEGATIVE NEGATIVE Final    Comment: (NOTE) SARS-CoV-2 target  nucleic acids are NOT DETECTED.  The SARS-CoV-2 RNA is generally detectable in upper and lower respiratory specimens during the acute phase of infection. Negative results do not preclude SARS-CoV-2 infection, do not rule out co-infections with other pathogens, and should not be used as the sole basis for treatment or other patient management decisions. Negative results must be combined with clinical observations, patient history, and epidemiological information. The expected result is Negative.  Fact Sheet for Patients: HairSlick.no  Fact Sheet for Healthcare Providers: quierodirigir.com  This test is not yet approved or cleared by the [place] FDA and  has been authorized for detection and/or diagnosis of SARS-CoV-2 by FDA under an Emergency Use Authorization (EUA). This EUA will remain  in effect (meaning this test can be used) for the duration of the COVID-19 declaration under Se ction 564(b)(1) of the Act, 21 U.S.C. section 360bbb-3(b)(1), unless the authorization is terminated or revoked sooner.  Performed at [facility], [address] [place], [place] [address]     Labs: CBC: Recent Labs  Lab 02/01/24 0431 02/02/24 0615  WBC 13.2* 12.0*  HGB 18.1* 16.6  HCT 53.0* 51.3  MCV 90.0 93.1  PLT 319 326   Basic Metabolic Panel: Recent Labs  Lab 02/01/24 0431 02/02/24 0615 02/02/24 1615  NA 131* 131* 131*  K 4.8 4.8 4.8  CL 98 98 99  CO2 19* 21* 24  GLUCOSE 119* 121* 127*  BUN 41* 37* 33*  CREATININE 1.44* 1.46* 1.12  CALCIUM 8.7* 8.5* 8.1*  MG 2.6*  --   --    Liver Function Tests: Recent Labs  Lab 02/01/24 0431  AST 33  ALT 37  ALKPHOS 85  BILITOT 0.4  PROT 7.3  ALBUMIN 3.0*   CBG: No results for input(s): "GLUCAP" in the last 168 hours.  Discharge time spent: greater than 30 minutes.  Signed: Meredeth Ide, MD [facility] 02/02/2024

## 2024-02-02 NOTE — Procedures (Signed)
PROCEDURE SUMMARY:  Successful image-guided paracentesis from the right lower abdomen.  Yielded 4.4 liters of clear yellow fluid.  No immediate complications.  EBL = trace. Patient tolerated well.   Please see imaging section of Epic for full dictation.   Loman Brooklyn PA-C 02/02/2024 10:35 AM

## 2024-02-02 NOTE — Progress Notes (Signed)
IP PROGRESS NOTE  Subjective:   Henry Pam presented to the emergency room yesterday with increased abdominal pain.  He relates the pain to increased ascites.  He is scheduled for a paracentesis today.  He reports adequate pain relief with the current narcotic regimen.  Objective: Vital signs in last 24 hours: Blood pressure (!) 149/98, pulse 77, temperature 98.1 F (36.7 C), temperature source Oral, resp. rate 18, SpO2 95%.  Intake/Output from previous day: 02/16 0701 - 02/17 0700 In: 2983.6 [P.O.:250; I.V.:1733.6; IV Piggyback:1000] Out: -   Physical Exam: Lungs: Lungs clear bilaterally Cardiac: Regular rate and rhythm Abdomen: Distended with ascites, no mass Extremities: No leg edema   Portacath/PICC-without erythema  Lab Results: Recent Labs    02/01/24 0431 02/02/24 0615  WBC 13.2* 12.0*  HGB 18.1* 16.6  HCT 53.0* 51.3  PLT 319 326    BMET Recent Labs    02/01/24 0431 02/02/24 0615  NA 131* 131*  K 4.8 4.8  CL 98 98  CO2 19* 21*  GLUCOSE 119* 121*  BUN 41* 37*  CREATININE 1.44* 1.46*  CALCIUM 8.7* 8.5*    Lab Results  Component Value Date   CEA 3.57 01/15/2024    Studies/Results: CT ABDOMEN PELVIS WO CONTRAST Result Date: 02/01/2024 CLINICAL DATA:  Abdominal pain, acute, nonlocalized. History of gastric cancer, metastatic. * Tracking Code: BO * EXAM: CT ABDOMEN AND PELVIS WITHOUT CONTRAST TECHNIQUE: Multidetector CT imaging of the abdomen and pelvis was performed following the standard protocol without IV contrast. RADIATION DOSE REDUCTION: This exam was performed according to the departmental dose-optimization program which includes automated exposure control, adjustment of the mA and/or kV according to patient size and/or use of iterative reconstruction technique. COMPARISON:  January 07, 2024. FINDINGS: Evaluation is limited by lack of IV contrast. Lower chest: No acute abnormality. Similar appearance of bibasilar interstitial lung disease.  Hepatobiliary: Gallbladder is distended. No extrahepatic biliary ductal dilation. No new focal hepatic abnormality. Pancreas: No new peripancreatic fat stranding. Spleen: Unremarkable. Adrenals/Urinary Tract: Adrenal glands are unremarkable. No hydronephrosis. Nonobstructing bilateral nephrolithiasis. Bladder is unremarkable. Stomach/Bowel: No evidence of bowel obstruction. Similar appearance of gastric wall thickening consistent with known malignancy. Vascular/Lymphatic: No aneurysmal dilation of the abdominal aorta. IVC filter. Similar shotty lymphadenopathy throughout the upper abdomen. Representative porta hepatic lymph node measures 8 mm in the short axis, previously 5 mm (series 2, image 24). Infiltrative soft tissue throughout the upper abdomen consistent with peritoneal carcinomatosis. Reproductive: Prostate is unremarkable. Other: Moderate volume ascites. Revisualization of interdigitating soft tissue throughout the omentum, favored overall increased since prior PET CT. This is consistent with biopsy-proven peritoneal carcinomatosis. Fat containing inguinal hernias. Musculoskeletal: No acute or significant osseous findings. IMPRESSION: 1. No evidence of bowel obstruction. 2. Similar appearance of gastric wall thickening consistent with known malignancy. 3. Moderate volume ascites with peritoneal carcinomatosis, favored overall increased since prior PET CT. 4. Nonobstructing bilateral nephrolithiasis. Electronically Signed   By: Meda Klinefelter M.D.   On: 02/01/2024 14:02    Medications: I have reviewed the patient's current medications.  Assessment/Plan: Gastric cancer 11/21/2023 EGD-gastric body with "infiltrative looking lesion"; biopsy shows involvement of a hypercellular lesion that suggests diffuse carcinoma by morphology CTs abdomen/pelvis 11/28/2023-diffuse fatty infiltration of the liver; gastric wall thickening; lymph nodes up to 6 mm in diameter near the stomach wall. 12/31/2023 upper  endoscopy-large diffuse friable infiltrative polypoid and ulcerated circumferential mass found in the gastric body.  Scope was passed beyond the mass with normal antrum/pylorus.  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  Negative for HER2 (1+); mismatch repair protein IHC normal; PD-L1 CPS 0% PET scan 01/07/2024-circumferential hypermetabolic gastric mucosal thickening.  Ill-defined hypermetabolic lymph nodes in the gastrohepatic ligament.  Intense hypermetabolic activity associated with the right lobe of the thyroid gland.  Small hypermetabolic right axillary node favored reactive. Biopsy omental/peritoneal thickening 01/22/2024-poorly differentiated adenocarcinoma with focal signet ring cell features Paracentesis 01/22/2024-ascites positive for malignancy, adenocarcinoma Cycle 1 FOLFOX planned 02/02/2024 Early satiety, postprandial abdominal pain, weight loss secondary to #1 History of bilateral lower extremity DVT 2021-anticoagulation discontinued due to massive retroperitoneal bleed, IVC filter placed 08/24/2020 Hospitalized with acute respiratory failure due to COVID-19 08/06/2020 - 09/04/2020 History of massive retroperitoneal bleed secondary to anticoagulation September 2021 Thyroid ultrasound 01/13/2024-no abnormal nodule identified in the right lobe.  1.1 cm thyroid isthmus nodule meets criteria for 1 year follow-up ultrasound. Admission 02/01/2024 with increased abdominal pain/ascites   Henry May has metastatic gastric cancer.  He is scheduled to begin FOLFOX chemotherapy today.  He was admitted yesterday with increased abdominal pain secondary to progressive ascites.  He will undergo a therapeutic paracentesis today.  He last underwent a paracentesis on 01/26/2024.  Hopefully the ascites will improve with chemotherapy.  Chemotherapy will be rescheduled for within the next few days.  The creatinine is mildly elevated,  potentially related to intravascular depletion.  No hydronephrosis was seen on the CT yesterday.  Chemotherapy will be adjusted for renal failure if the creatinine remains elevated.   Recommendations: 1.  Therapeutic paracentesis 2.  Discharged home following the paracentesis procedure 3.  Follow-up with the cancer center will be scheduled for 02/03/2024 or 02/04/2024 to begin FOLFOX chemotherapy. 4.  Please call oncology as needed   LOS: 0 days   Thornton Papas, MD   02/02/2024, 7:27 AM

## 2024-02-03 ENCOUNTER — Inpatient Hospital Stay: Payer: No Typology Code available for payment source

## 2024-02-03 ENCOUNTER — Other Ambulatory Visit: Payer: Self-pay | Admitting: Oncology

## 2024-02-03 ENCOUNTER — Other Ambulatory Visit: Payer: Self-pay

## 2024-02-03 VITALS — BP 143/97 | HR 74 | Ht 72.0 in | Wt 199.8 lb

## 2024-02-03 DIAGNOSIS — C169 Malignant neoplasm of stomach, unspecified: Secondary | ICD-10-CM

## 2024-02-03 LAB — CBC WITH DIFFERENTIAL (CANCER CENTER ONLY)
Abs Immature Granulocytes: 0.08 10*3/uL — ABNORMAL HIGH (ref 0.00–0.07)
Basophils Absolute: 0 10*3/uL (ref 0.0–0.1)
Basophils Relative: 0 %
Eosinophils Absolute: 0.3 10*3/uL (ref 0.0–0.5)
Eosinophils Relative: 2 %
HCT: 50.2 % (ref 39.0–52.0)
Hemoglobin: 16.8 g/dL (ref 13.0–17.0)
Immature Granulocytes: 1 %
Lymphocytes Relative: 4 %
Lymphs Abs: 0.6 10*3/uL — ABNORMAL LOW (ref 0.7–4.0)
MCH: 29.8 pg (ref 26.0–34.0)
MCHC: 33.5 g/dL (ref 30.0–36.0)
MCV: 89.2 fL (ref 80.0–100.0)
Monocytes Absolute: 1 10*3/uL (ref 0.1–1.0)
Monocytes Relative: 6 %
Neutro Abs: 14.2 10*3/uL — ABNORMAL HIGH (ref 1.7–7.7)
Neutrophils Relative %: 87 %
Platelet Count: 317 10*3/uL (ref 150–400)
RBC: 5.63 MIL/uL (ref 4.22–5.81)
RDW: 12.2 % (ref 11.5–15.5)
WBC Count: 16.1 10*3/uL — ABNORMAL HIGH (ref 4.0–10.5)
nRBC: 0 % (ref 0.0–0.2)

## 2024-02-03 LAB — CMP (CANCER CENTER ONLY)
ALT: 20 U/L (ref 0–44)
AST: 22 U/L (ref 15–41)
Albumin: 3.3 g/dL — ABNORMAL LOW (ref 3.5–5.0)
Alkaline Phosphatase: 84 U/L (ref 38–126)
Anion gap: 9 (ref 5–15)
BUN: 33 mg/dL — ABNORMAL HIGH (ref 6–20)
CO2: 24 mmol/L (ref 22–32)
Calcium: 8.8 mg/dL — ABNORMAL LOW (ref 8.9–10.3)
Chloride: 96 mmol/L — ABNORMAL LOW (ref 98–111)
Creatinine: 1.31 mg/dL — ABNORMAL HIGH (ref 0.61–1.24)
GFR, Estimated: >60 mL/min (ref >60)
Glucose, Bld: 148 mg/dL — ABNORMAL HIGH (ref 70–99)
Potassium: 4.9 mmol/L (ref 3.5–5.1)
Sodium: 129 mmol/L — ABNORMAL LOW (ref 135–145)
Total Bilirubin: 0.5 mg/dL (ref 0.0–1.2)
Total Protein: 6.7 g/dL (ref 6.5–8.1)

## 2024-02-03 MED ORDER — OXALIPLATIN CHEMO INJECTION 100 MG/20ML
82.7000 mg/m2 | Freq: Once | INTRAVENOUS | Status: AC
  Administered 2024-02-03: 180 mg via INTRAVENOUS
  Filled 2024-02-03: qty 36

## 2024-02-03 MED ORDER — FLUOROURACIL CHEMO INJECTION 2.5 GM/50ML
400.0000 mg/m2 | Freq: Once | INTRAVENOUS | Status: AC
  Administered 2024-02-03: 850 mg via INTRAVENOUS
  Filled 2024-02-03: qty 17

## 2024-02-03 MED ORDER — LORAZEPAM 0.5 MG PO TABS: 0.5000 mg | ORAL_TABLET | Freq: Every day | ORAL | 0 refills | Status: DC

## 2024-02-03 MED ORDER — SODIUM CHLORIDE 0.9 % IV SOLN
2400.0000 mg/m2 | INTRAVENOUS | Status: DC
  Administered 2024-02-03: 5000 mg via INTRAVENOUS
  Filled 2024-02-03: qty 100

## 2024-02-03 MED ORDER — LEUCOVORIN CALCIUM INJECTION 350 MG
400.0000 mg/m2 | Freq: Once | INTRAVENOUS | Status: AC
  Administered 2024-02-03: 860 mg via INTRAVENOUS
  Filled 2024-02-03: qty 43

## 2024-02-03 MED ORDER — DEXTROSE 5 % IV SOLN
INTRAVENOUS | Status: DC
Start: 2024-02-03 — End: 2024-02-03

## 2024-02-03 MED ORDER — DEXAMETHASONE SODIUM PHOSPHATE 10 MG/ML IJ SOLN
10.0000 mg | Freq: Once | INTRAMUSCULAR | Status: AC
  Administered 2024-02-03: 10 mg via INTRAVENOUS
  Filled 2024-02-03: qty 1

## 2024-02-03 MED ORDER — PALONOSETRON HCL INJECTION 0.25 MG/5ML
0.2500 mg | Freq: Once | INTRAVENOUS | Status: AC
  Administered 2024-02-03: 0.25 mg via INTRAVENOUS
  Filled 2024-02-03: qty 5

## 2024-02-03 NOTE — Progress Notes (Signed)
[facility] CSW Progress Note  Visual merchandiser met with patient and caregiver on this date during treatment. Patient's spouse provided updates with treatment and was emotional during the visit. CSW provided emotional support for patient's spouse. CSW sent request for updates via email to servant center and DSS. Patient's spouse inquired about assistance with SNAP application. CSW will schedule F/u call for 2/21  Marguerita Merles, LCSW Clinical Social Worker [facility]

## 2024-02-03 NOTE — Telephone Encounter (Signed)
VM left for ativan prescription per MD Truett Perna at [facility] on [address].

## 2024-02-03 NOTE — Patient Instructions (Addendum)
[facility]   Discharge Instructions: The chemotherapy medication bag should finish at 46 hours, 96 hours, or 7 days. For example, if your pump is scheduled for 46 hours and it was put on at 4:00 p.m., it should finish at 2:00 p.m. the day it is scheduled to come off regardless of your appointment time.     Estimated time to finish at 11:30 Thursday, February 05, 2024.   If the display on your pump reads "Low Volume" and it is beeping, take the batteries out of the pump and come to the cancer center for it to be taken off.   If the pump alarms go off prior to the pump reading "Low Volume" then call [phone] and someone can assist you.  If the plunger comes out and the chemotherapy medication is leaking out, please use your home chemo spill kit to clean up the spill. Do NOT use paper towels or other household products.  If you have problems or questions regarding your pump, please call either [phone] (24 hours a day) or the cancer center Monday-Friday 8:00 a.m.- 4:30 p.m. at the clinic number and we will assist you. If you are unable to get assistance, then go to the nearest Emergency Department and ask the staff to contact the IV team for assistance.   Thank you for choosing [facility] to provide your oncology and hematology care.   If you have a lab appointment with the Cancer Center, please go directly to the Cancer Center and check in at the registration area.   Wear comfortable clothing and clothing appropriate for easy access to any Portacath or PICC line.   We strive to give you quality time with your provider. You may need to reschedule your appointment if you arrive late (15 or more minutes).  Arriving late affects you and other patients whose appointments are after yours.  Also, if you miss three or more appointments without notifying the office, you may be dismissed from the clinic at the provider's  discretion.      For prescription refill requests, have your pharmacy contact our office and allow 72 hours for refills to be completed.    Today you received the following chemotherapy and/or immunotherapy agents OXALIPLATIN/LEUCOVORIN/FLUOROURACIL      To help prevent nausea and vomiting after your treatment, we encourage you to take your nausea medication as directed.  BELOW ARE SYMPTOMS THAT SHOULD BE REPORTED IMMEDIATELY: *FEVER GREATER THAN 100.4 F (38 C) OR HIGHER *CHILLS OR SWEATING *NAUSEA AND VOMITING THAT IS NOT CONTROLLED WITH YOUR NAUSEA MEDICATION *UNUSUAL SHORTNESS OF BREATH *UNUSUAL BRUISING OR BLEEDING *URINARY PROBLEMS (pain or burning when urinating, or frequent urination) *BOWEL PROBLEMS (unusual diarrhea, constipation, pain near the anus) TENDERNESS IN MOUTH AND THROAT WITH OR WITHOUT PRESENCE OF ULCERS (sore throat, sores in mouth, or a toothache) UNUSUAL RASH, SWELLING OR PAIN  UNUSUAL VAGINAL DISCHARGE OR ITCHING   Items with * indicate a potential emergency and should be followed up as soon as possible or go to the Emergency Department if any problems should occur.  Please show the CHEMOTHERAPY ALERT CARD or IMMUNOTHERAPY ALERT CARD at check-in to the Emergency Department and triage nurse.  Should you have questions after your visit or need to cancel or reschedule your appointment, please contact [facility]  Dept: [phone]  and follow the prompts.  Office hours  are 8:00 a.m. to 4:30 p.m. Monday - Friday. Please note that voicemails left after 4:00 p.m. may not be returned until the following business day.  We are closed weekends and major holidays. You have access to a nurse at all times for urgent questions. Please call the main number to the clinic Dept: [phone] and follow the prompts.   For any non-urgent questions, you may also contact your provider using MyChart. We now offer e-Visits for  anyone 35 and older to request care online for non-urgent symptoms. For details visit [email or web address].   Also download the MyChart app! Go to the app store, search "MyChart", open the app, select [facility], and log in with your MyChart username and password.  Oxaliplatin Injection What is this medication? OXALIPLATIN (ox AL i PLA tin) treats colorectal cancer. It works by slowing down the growth of cancer cells. This medicine may be used for other purposes; ask your health care provider or pharmacist if you have questions. COMMON BRAND NAME(S): Eloxatin What should I tell my care team before I take this medication? They need to know if you have any of these conditions: Heart disease History of irregular heartbeat or rhythm Liver disease Low blood cell levels (white cells, red cells, and platelets) Lung or breathing disease, such as asthma Take medications that treat or prevent blood clots Tingling of the fingers, toes, or other nerve disorder An unusual or allergic reaction to oxaliplatin, other medications, foods, dyes, or preservatives If you or your partner are pregnant or trying to get pregnant Breast-feeding How should I use this medication? This medication is injected into a vein. It is given by your care team in a hospital or clinic setting. Talk to your care team about the use of this medication in children. Special care may be needed. Overdosage: If you think you have taken too much of this medicine contact a poison control center or emergency room at once. NOTE: This medicine is only for you. Do not share this medicine with others. What if I miss a dose? Keep appointments for follow-up doses. It is important not to miss a dose. Call your care team if you are unable to keep an appointment. What may interact with this medication? Do not take this medication with any of the following: Cisapride Dronedarone Pimozide Thioridazine This medication may also interact with  the following: Aspirin and aspirin-like medications Certain medications that treat or prevent blood clots, such as warfarin, apixaban, dabigatran, and rivaroxaban Cisplatin Cyclosporine Diuretics Medications for infection, such as acyclovir, adefovir, amphotericin B, bacitracin, cidofovir, foscarnet, ganciclovir, gentamicin, pentamidine, vancomycin NSAIDs, medications for pain and inflammation, such as ibuprofen or naproxen Other medications that cause heart rhythm changes Pamidronate Zoledronic acid This list may not describe all possible interactions. Give your health care provider a list of all the medicines, herbs, non-prescription drugs, or dietary supplements you use. Also tell them if you smoke, drink alcohol, or use illegal drugs. Some items may interact with your medicine. What should I watch for while using this medication? Your condition will be monitored carefully while you are receiving this medication. You may need blood work while taking this medication. This medication may make you feel generally unwell. This is not uncommon as chemotherapy can affect healthy cells as well as cancer cells. Report any side effects. Continue your course of treatment even though you feel ill unless your care team tells you to stop. This medication may increase your risk of getting an infection. Call your care team  for advice if you get a fever, chills, sore throat, or other symptoms of a cold or flu. Do not treat yourself. Try to avoid being around people who are sick. Avoid taking medications that contain aspirin, acetaminophen, ibuprofen, naproxen, or ketoprofen unless instructed by your care team. These medications may hide a fever. Be careful brushing or flossing your teeth or using a toothpick because you may get an infection or bleed more easily. If you have any dental work done, tell your dentist you are receiving this medication. This medication can make you more sensitive to cold. Do not  drink cold drinks or use ice. Cover exposed skin before coming in contact with cold temperatures or cold objects. When out in cold weather wear warm clothing and cover your mouth and nose to warm the air that goes into your lungs. Tell your care team if you get sensitive to the cold. Talk to your care team if you or your partner are pregnant or think either of you might be pregnant. This medication can cause serious birth defects if taken during pregnancy and for 9 months after the last dose. A negative pregnancy test is required before starting this medication. A reliable form of contraception is recommended while taking this medication and for 9 months after the last dose. Talk to your care team about effective forms of contraception. Do not father a child while taking this medication and for 6 months after the last dose. Use a condom while having sex during this time period. Do not breastfeed while taking this medication and for 3 months after the last dose. This medication may cause infertility. Talk to your care team if you are concerned about your fertility. What side effects may I notice from receiving this medication? Side effects that you should report to your care team as soon as possible: Allergic reactions--skin rash, itching, hives, swelling of the face, lips, tongue, or throat Bleeding--bloody or black, tar-like stools, vomiting blood or brown material that looks like coffee grounds, red or dark brown urine, small red or purple spots on skin, unusual bruising or bleeding Dry cough, shortness of breath or trouble breathing Heart rhythm changes--fast or irregular heartbeat, dizziness, feeling faint or lightheaded, chest pain, trouble breathing Infection--fever, chills, cough, sore throat, wounds that don't heal, pain or trouble when passing urine, general feeling of discomfort or being unwell Liver injury--right upper belly pain, loss of appetite, nausea, light-colored stool, dark yellow or  brown urine, yellowing skin or eyes, unusual weakness or fatigue Low red blood cell level--unusual weakness or fatigue, dizziness, headache, trouble breathing Muscle injury--unusual weakness or fatigue, muscle pain, dark yellow or brown urine, decrease in amount of urine Pain, tingling, or numbness in the hands or feet Sudden and severe headache, confusion, change in vision, seizures, which may be signs of posterior reversible encephalopathy syndrome (PRES) Unusual bruising or bleeding Side effects that usually do not require medical attention (report to your care team if they continue or are bothersome): Diarrhea Nausea Pain, redness, or swelling with sores inside the mouth or throat Unusual weakness or fatigue Vomiting This list may not describe all possible side effects. Call your doctor for medical advice about side effects. You may report side effects to FDA at 1-800-FDA-1088. Where should I keep my medication? This medication is given in a hospital or clinic. It will not be stored at home. NOTE: This sheet is a summary. It may not cover all possible information. If you have questions about this medicine, talk to  your doctor, pharmacist, or health care provider.  2024 Elsevier/Gold Standard (2023-11-14 00:00:00) Leucovorin Injection What is this medication? LEUCOVORIN (loo koe VOR in) prevents side effects from certain medications, such as methotrexate. It works by increasing folate levels. This helps protect healthy cells in your body. It may also be used to treat anemia caused by low levels of folate. It can also be used with fluorouracil, a type of chemotherapy, to treat colorectal cancer. It works by increasing the effects of fluorouracil in the body. This medicine may be used for other purposes; ask your health care provider or pharmacist if you have questions. What should I tell my care team before I take this medication? They need to know if you have any of these  conditions: Anemia from low levels of vitamin B12 in the blood An unusual or allergic reaction to leucovorin, folic acid, other medications, foods, dyes, or preservatives Pregnant or trying to get pregnant Breastfeeding How should I use this medication? This medication is injected into a vein or a muscle. It is given by your care team in a hospital or clinic setting. Talk to your care team about the use of this medication in children. Special care may be needed. Overdosage: If you think you have taken too much of this medicine contact a poison control center or emergency room at once. NOTE: This medicine is only for you. Do not share this medicine with others. What if I miss a dose? Keep appointments for follow-up doses. It is important not to miss your dose. Call your care team if you are unable to keep an appointment. What may interact with this medication? Capecitabine Fluorouracil Phenobarbital Phenytoin Primidone Trimethoprim;sulfamethoxazole This list may not describe all possible interactions. Give your health care provider a list of all the medicines, herbs, non-prescription drugs, or dietary supplements you use. Also tell them if you smoke, drink alcohol, or use illegal drugs. Some items may interact with your medicine. What should I watch for while using this medication? Your condition will be monitored carefully while you are receiving this medication. This medication may increase the side effects of 5-fluorouracil. Tell your care team if you have diarrhea or mouth sores that do not get better or that get worse. What side effects may I notice from receiving this medication? Side effects that you should report to your care team as soon as possible: Allergic reactions--skin rash, itching, hives, swelling of the face, lips, tongue, or throat This list may not describe all possible side effects. Call your doctor for medical advice about side effects. You may report side effects to  FDA at 1-800-FDA-1088. Where should I keep my medication? This medication is given in a hospital or clinic. It will not be stored at home. NOTE: This sheet is a summary. It may not cover all possible information. If you have questions about this medicine, talk to your doctor, pharmacist, or health care provider.  2024 Elsevier/Gold Standard (2022-05-07 00:00:00)  Fluorouracil Injection What is this medication? FLUOROURACIL (flure oh YOOR a sil) treats some types of cancer. It works by slowing down the growth of cancer cells. This medicine may be used for other purposes; ask your health care provider or pharmacist if you have questions. COMMON BRAND NAME(S): Adrucil What should I tell my care team before I take this medication? They need to know if you have any of these conditions: Blood disorders Dihydropyrimidine dehydrogenase (DPD) deficiency Infection, such as chickenpox, cold sores, herpes Kidney disease Liver disease Poor nutrition Recent  or ongoing radiation therapy An unusual or allergic reaction to fluorouracil, other medications, foods, dyes, or preservatives If you or your partner are pregnant or trying to get pregnant Breast-feeding How should I use this medication? This medication is injected into a vein. It is administered by your care team in a hospital or clinic setting. Talk to your care team about the use of this medication in children. Special care may be needed. Overdosage: If you think you have taken too much of this medicine contact a poison control center or emergency room at once. NOTE: This medicine is only for you. Do not share this medicine with others. What if I miss a dose? Keep appointments for follow-up doses. It is important not to miss your dose. Call your care team if you are unable to keep an appointment. What may interact with this medication? Do not take this medication with any of the following: Live virus vaccines This medication may also  interact with the following: Medications that treat or prevent blood clots, such as warfarin, enoxaparin, dalteparin This list may not describe all possible interactions. Give your health care provider a list of all the medicines, herbs, non-prescription drugs, or dietary supplements you use. Also tell them if you smoke, drink alcohol, or use illegal drugs. Some items may interact with your medicine. What should I watch for while using this medication? Your condition will be monitored carefully while you are receiving this medication. This medication may make you feel generally unwell. This is not uncommon as chemotherapy can affect healthy cells as well as cancer cells. Report any side effects. Continue your course of treatment even though you feel ill unless your care team tells you to stop. In some cases, you may be given additional medications to help with side effects. Follow all directions for their use. This medication may increase your risk of getting an infection. Call your care team for advice if you get a fever, chills, sore throat, or other symptoms of a cold or flu. Do not treat yourself. Try to avoid being around people who are sick. This medication may increase your risk to bruise or bleed. Call your care team if you notice any unusual bleeding. Be careful brushing or flossing your teeth or using a toothpick because you may get an infection or bleed more easily. If you have any dental work done, tell your dentist you are receiving this medication. Avoid taking medications that contain aspirin, acetaminophen, ibuprofen, naproxen, or ketoprofen unless instructed by your care team. These medications may hide a fever. Do not treat diarrhea with over the counter products. Contact your care team if you have diarrhea that lasts more than 2 days or if it is severe and watery. This medication can make you more sensitive to the sun. Keep out of the sun. If you cannot avoid being in the sun, wear  protective clothing and sunscreen. Do not use sun lamps, tanning beds, or tanning booths. Talk to your care team if you or your partner wish to become pregnant or think you might be pregnant. This medication can cause serious birth defects if taken during pregnancy and for 3 months after the last dose. A reliable form of contraception is recommended while taking this medication and for 3 months after the last dose. Talk to your care team about effective forms of contraception. Do not father a child while taking this medication and for 3 months after the last dose. Use a condom while having sex during this time  period. Do not breastfeed while taking this medication. This medication may cause infertility. Talk to your care team if you are concerned about your fertility. What side effects may I notice from receiving this medication? Side effects that you should report to your care team as soon as possible: Allergic reactions--skin rash, itching, hives, swelling of the face, lips, tongue, or throat Heart attack--pain or tightness in the chest, shoulders, arms, or jaw, nausea, shortness of breath, cold or clammy skin, feeling faint or lightheaded Heart failure--shortness of breath, swelling of the ankles, feet, or hands, sudden weight gain, unusual weakness or fatigue Heart rhythm changes--fast or irregular heartbeat, dizziness, feeling faint or lightheaded, chest pain, trouble breathing High ammonia level--unusual weakness or fatigue, confusion, loss of appetite, nausea, vomiting, seizures Infection--fever, chills, cough, sore throat, wounds that don't heal, pain or trouble when passing urine, general feeling of discomfort or being unwell Low red blood cell level--unusual weakness or fatigue, dizziness, headache, trouble breathing Pain, tingling, or numbness in the hands or feet, muscle weakness, change in vision, confusion or trouble speaking, loss of balance or coordination, trouble walking,  seizures Redness, swelling, and blistering of the skin over hands and feet Severe or prolonged diarrhea Unusual bruising or bleeding Side effects that usually do not require medical attention (report to your care team if they continue or are bothersome): Dry skin Headache Increased tears Nausea Pain, redness, or swelling with sores inside the mouth or throat Sensitivity to light Vomiting This list may not describe all possible side effects. Call your doctor for medical advice about side effects. You may report side effects to FDA at 1-800-FDA-1088. Where should I keep my medication? This medication is given in a hospital or clinic. It will not be stored at home. NOTE: This sheet is a summary. It may not cover all possible information. If you have questions about this medicine, talk to your doctor, pharmacist, or health care provider.  2024 Elsevier/Gold Standard (2022-04-09 00:00:00)

## 2024-02-04 ENCOUNTER — Ambulatory Visit (HOSPITAL_COMMUNITY): Payer: No Typology Code available for payment source

## 2024-02-04 ENCOUNTER — Telehealth: Payer: Self-pay

## 2024-02-04 NOTE — Telephone Encounter (Signed)
24 Hour Call Back  Telephone call to patient post his first time Folfox treatment. Unable to speak with patient, his daughter answered the call and reported that patient was currently sleeping. She shared that patient has been very fatigued, nausea with vomiting. This Clinical research associate informed her that if this gets worse and concerning and unmanageable with his prescribed antiemetics, daughter should take patient to the ED, otherwise we will see him back in the clinic tomorrow for his pump stop appointment.

## 2024-02-05 ENCOUNTER — Emergency Department (HOSPITAL_BASED_OUTPATIENT_CLINIC_OR_DEPARTMENT_OTHER): Payer: No Typology Code available for payment source

## 2024-02-05 ENCOUNTER — Inpatient Hospital Stay: Payer: No Typology Code available for payment source

## 2024-02-05 ENCOUNTER — Inpatient Hospital Stay (HOSPITAL_BASED_OUTPATIENT_CLINIC_OR_DEPARTMENT_OTHER)
Admission: EM | Admit: 2024-02-05 | Discharge: 2024-02-13 | DRG: 374 | Disposition: A | Discharge status: Home / Self Care | Payer: No Typology Code available for payment source | Attending: Family Medicine | Admitting: Family Medicine

## 2024-02-05 ENCOUNTER — Other Ambulatory Visit: Payer: Self-pay

## 2024-02-05 ENCOUNTER — Telehealth: Payer: Self-pay | Admitting: *Deleted

## 2024-02-05 ENCOUNTER — Encounter (HOSPITAL_BASED_OUTPATIENT_CLINIC_OR_DEPARTMENT_OTHER): Payer: Self-pay

## 2024-02-05 ENCOUNTER — Inpatient Hospital Stay: Payer: No Typology Code available for payment source | Admitting: Nurse Practitioner

## 2024-02-05 ENCOUNTER — Other Ambulatory Visit: Payer: Self-pay | Admitting: *Deleted

## 2024-02-05 VITALS — BP 111/85 | HR 93 | Temp 98.1°F | Resp 16 | Wt 197.6 lb

## 2024-02-05 VITALS — BP 115/84 | HR 85 | Temp 98.2°F | Resp 22

## 2024-02-05 DIAGNOSIS — C169 Malignant neoplasm of stomach, unspecified: Secondary | ICD-10-CM | POA: Diagnosis not present

## 2024-02-05 DIAGNOSIS — Z87442 Personal history of urinary calculi: Secondary | ICD-10-CM

## 2024-02-05 DIAGNOSIS — E876 Hypokalemia: Secondary | ICD-10-CM | POA: Diagnosis present

## 2024-02-05 DIAGNOSIS — Z95828 Presence of other vascular implants and grafts: Secondary | ICD-10-CM

## 2024-02-05 DIAGNOSIS — R410 Disorientation, unspecified: Secondary | ICD-10-CM

## 2024-02-05 DIAGNOSIS — C162 Malignant neoplasm of body of stomach: Secondary | ICD-10-CM | POA: Diagnosis present

## 2024-02-05 DIAGNOSIS — Z86718 Personal history of other venous thrombosis and embolism: Secondary | ICD-10-CM | POA: Diagnosis not present

## 2024-02-05 DIAGNOSIS — Z87891 Personal history of nicotine dependence: Secondary | ICD-10-CM

## 2024-02-05 DIAGNOSIS — R4586 Emotional lability: Secondary | ICD-10-CM | POA: Diagnosis present

## 2024-02-05 DIAGNOSIS — E861 Hypovolemia: Secondary | ICD-10-CM | POA: Diagnosis present

## 2024-02-05 DIAGNOSIS — Z781 Physical restraint status: Secondary | ICD-10-CM

## 2024-02-05 DIAGNOSIS — B370 Candidal stomatitis: Secondary | ICD-10-CM

## 2024-02-05 DIAGNOSIS — Z85028 Personal history of other malignant neoplasm of stomach: Secondary | ICD-10-CM | POA: Diagnosis not present

## 2024-02-05 DIAGNOSIS — K5900 Constipation, unspecified: Secondary | ICD-10-CM | POA: Diagnosis present

## 2024-02-05 DIAGNOSIS — R9431 Abnormal electrocardiogram [ECG] [EKG]: Secondary | ICD-10-CM | POA: Diagnosis present

## 2024-02-05 DIAGNOSIS — E875 Hyperkalemia: Secondary | ICD-10-CM | POA: Diagnosis present

## 2024-02-05 DIAGNOSIS — R443 Hallucinations, unspecified: Secondary | ICD-10-CM | POA: Diagnosis present

## 2024-02-05 DIAGNOSIS — Z1152 Encounter for screening for COVID-19: Secondary | ICD-10-CM | POA: Diagnosis not present

## 2024-02-05 DIAGNOSIS — R180 Malignant ascites: Secondary | ICD-10-CM | POA: Diagnosis present

## 2024-02-05 DIAGNOSIS — K121 Other forms of stomatitis: Secondary | ICD-10-CM | POA: Diagnosis present

## 2024-02-05 DIAGNOSIS — C786 Secondary malignant neoplasm of retroperitoneum and peritoneum: Secondary | ICD-10-CM | POA: Diagnosis present

## 2024-02-05 DIAGNOSIS — N170 Acute kidney failure with tubular necrosis: Secondary | ICD-10-CM | POA: Diagnosis present

## 2024-02-05 DIAGNOSIS — E860 Dehydration: Secondary | ICD-10-CM | POA: Diagnosis present

## 2024-02-05 DIAGNOSIS — R112 Nausea with vomiting, unspecified: Secondary | ICD-10-CM | POA: Diagnosis present

## 2024-02-05 DIAGNOSIS — N179 Acute kidney failure, unspecified: Secondary | ICD-10-CM | POA: Diagnosis not present

## 2024-02-05 DIAGNOSIS — K1230 Oral mucositis (ulcerative), unspecified: Secondary | ICD-10-CM | POA: Diagnosis present

## 2024-02-05 DIAGNOSIS — Z8701 Personal history of pneumonia (recurrent): Secondary | ICD-10-CM

## 2024-02-05 DIAGNOSIS — K649 Unspecified hemorrhoids: Secondary | ICD-10-CM | POA: Diagnosis present

## 2024-02-05 DIAGNOSIS — R944 Abnormal results of kidney function studies: Secondary | ICD-10-CM | POA: Diagnosis present

## 2024-02-05 DIAGNOSIS — Z9181 History of falling: Secondary | ICD-10-CM

## 2024-02-05 DIAGNOSIS — E871 Hypo-osmolality and hyponatremia: Secondary | ICD-10-CM | POA: Diagnosis present

## 2024-02-05 DIAGNOSIS — I110 Hypertensive heart disease with heart failure: Secondary | ICD-10-CM | POA: Diagnosis present

## 2024-02-05 DIAGNOSIS — G9341 Metabolic encephalopathy: Secondary | ICD-10-CM | POA: Diagnosis present

## 2024-02-05 DIAGNOSIS — R7401 Elevation of levels of liver transaminase levels: Secondary | ICD-10-CM | POA: Diagnosis present

## 2024-02-05 DIAGNOSIS — Z8616 Personal history of COVID-19: Secondary | ICD-10-CM

## 2024-02-05 DIAGNOSIS — J189 Pneumonia, unspecified organism: Secondary | ICD-10-CM | POA: Diagnosis present

## 2024-02-05 DIAGNOSIS — K219 Gastro-esophageal reflux disease without esophagitis: Secondary | ICD-10-CM | POA: Diagnosis present

## 2024-02-05 LAB — URINALYSIS, ROUTINE W REFLEX MICROSCOPIC
Bacteria, UA: NONE SEEN
Bilirubin Urine: NEGATIVE
Glucose, UA: NEGATIVE mg/dL
Hgb urine dipstick: NEGATIVE
Ketones, ur: NEGATIVE mg/dL
Leukocytes,Ua: NEGATIVE
Nitrite: NEGATIVE
Protein, ur: NEGATIVE mg/dL
Specific Gravity, Urine: 1.021 (ref 1.005–1.030)
pH: 5.5 (ref 5.0–8.0)

## 2024-02-05 LAB — COMPREHENSIVE METABOLIC PANEL
ALT: 65 U/L — ABNORMAL HIGH (ref 0–44)
ALT: 75 U/L — ABNORMAL HIGH (ref 0–44)
AST: 57 U/L — ABNORMAL HIGH (ref 15–41)
AST: 66 U/L — ABNORMAL HIGH (ref 15–41)
Albumin: 2.9 g/dL — ABNORMAL LOW (ref 3.5–5.0)
Albumin: 3.4 g/dL — ABNORMAL LOW (ref 3.5–5.0)
Alkaline Phosphatase: 71 U/L (ref 38–126)
Alkaline Phosphatase: 84 U/L (ref 38–126)
Anion gap: 12 (ref 5–15)
Anion gap: 12 (ref 5–15)
BUN: 56 mg/dL — ABNORMAL HIGH (ref 6–20)
BUN: 57 mg/dL — ABNORMAL HIGH (ref 6–20)
CO2: 21 mmol/L — ABNORMAL LOW (ref 22–32)
CO2: 24 mmol/L (ref 22–32)
Calcium: 7.4 mg/dL — ABNORMAL LOW (ref 8.9–10.3)
Calcium: 8.5 mg/dL — ABNORMAL LOW (ref 8.9–10.3)
Chloride: 99 mmol/L (ref 98–111)
Chloride: 93 mmol/L — ABNORMAL LOW (ref 98–111)
Creatinine, Ser: 1.41 mg/dL — ABNORMAL HIGH (ref 0.61–1.24)
Creatinine, Ser: 1.59 mg/dL — ABNORMAL HIGH (ref 0.61–1.24)
GFR, Estimated: 57 mL/min — ABNORMAL LOW (ref >60)
GFR, Estimated: 50 mL/min — ABNORMAL LOW (ref >60)
Glucose, Bld: 155 mg/dL — ABNORMAL HIGH (ref 70–99)
Glucose, Bld: 147 mg/dL — ABNORMAL HIGH (ref 70–99)
Potassium: 4.7 mmol/L (ref 3.5–5.1)
Potassium: 5.3 mmol/L — ABNORMAL HIGH (ref 3.5–5.1)
Sodium: 132 mmol/L — ABNORMAL LOW (ref 135–145)
Sodium: 129 mmol/L — ABNORMAL LOW (ref 135–145)
Total Bilirubin: 0.4 mg/dL (ref 0.0–1.2)
Total Bilirubin: 0.5 mg/dL (ref 0.0–1.2)
Total Protein: 5.5 g/dL — ABNORMAL LOW (ref 6.5–8.1)
Total Protein: 6.4 g/dL — ABNORMAL LOW (ref 6.5–8.1)

## 2024-02-05 LAB — CBC WITH DIFFERENTIAL/PLATELET
Abs Immature Granulocytes: 0.02 10*3/uL (ref 0.00–0.07)
Basophils Absolute: 0 10*3/uL (ref 0.0–0.1)
Basophils Relative: 0 %
Eosinophils Absolute: 0.1 10*3/uL (ref 0.0–0.5)
Eosinophils Relative: 1 %
HCT: 49.5 % (ref 39.0–52.0)
Hemoglobin: 16.5 g/dL (ref 13.0–17.0)
Immature Granulocytes: 0 %
Lymphocytes Relative: 4 %
Lymphs Abs: 0.4 10*3/uL — ABNORMAL LOW (ref 0.7–4.0)
MCH: 29.8 pg (ref 26.0–34.0)
MCHC: 33.3 g/dL (ref 30.0–36.0)
MCV: 89.4 fL (ref 80.0–100.0)
Monocytes Absolute: 0.4 10*3/uL (ref 0.1–1.0)
Monocytes Relative: 4 %
Neutro Abs: 8.9 10*3/uL — ABNORMAL HIGH (ref 1.7–7.7)
Neutrophils Relative %: 91 %
Platelets: 303 10*3/uL (ref 150–400)
RBC: 5.54 MIL/uL (ref 4.22–5.81)
RDW: 12.4 % (ref 11.5–15.5)
WBC: 9.9 10*3/uL (ref 4.0–10.5)
nRBC: 0 % (ref 0.0–0.2)

## 2024-02-05 LAB — LIPASE, BLOOD: Lipase: 39 U/L (ref 11–51)

## 2024-02-05 LAB — LACTIC ACID, PLASMA
Lactic Acid, Venous: 1.4 mmol/L (ref 0.5–1.9)
Lactic Acid, Venous: 1.7 mmol/L (ref 0.5–1.9)

## 2024-02-05 LAB — RESP PANEL BY RT-PCR (RSV, FLU A&B, COVID)  RVPGX2
Influenza A by PCR: NEGATIVE
Influenza B by PCR: NEGATIVE
Resp Syncytial Virus by PCR: NEGATIVE
SARS Coronavirus 2 by RT PCR: NEGATIVE

## 2024-02-05 LAB — MAGNESIUM: Magnesium: 2.7 mg/dL — ABNORMAL HIGH (ref 1.7–2.4)

## 2024-02-05 LAB — GLUCOSE, CAPILLARY: Glucose-Capillary: 146 mg/dL — ABNORMAL HIGH (ref 70–99)

## 2024-02-05 LAB — AMMONIA: Ammonia: 39 umol/L — ABNORMAL HIGH (ref 9–35)

## 2024-02-05 LAB — BRAIN NATRIURETIC PEPTIDE: B Natriuretic Peptide: 420.4 pg/mL — ABNORMAL HIGH (ref 0.0–100.0)

## 2024-02-05 MED ORDER — GADOBUTROL 1 MMOL/ML IV SOLN
9.0000 mL | Freq: Once | INTRAVENOUS | Status: AC | PRN
  Administered 2024-02-05: 9 mL via INTRAVENOUS

## 2024-02-05 MED ORDER — SODIUM CHLORIDE 0.9 % IV SOLN
500.0000 mg | INTRAVENOUS | Status: DC
  Administered 2024-02-06 – 2024-02-07 (×2): 500 mg via INTRAVENOUS
  Filled 2024-02-05 (×2): qty 5

## 2024-02-05 MED ORDER — AZITHROMYCIN 250 MG PO TABS
500.0000 mg | ORAL_TABLET | Freq: Once | ORAL | Status: AC
  Administered 2024-02-05: 500 mg via ORAL
  Filled 2024-02-05: qty 2

## 2024-02-05 MED ORDER — HEPARIN SOD (PORK) LOCK FLUSH 100 UNIT/ML IV SOLN: 500.0000 [IU] | Freq: Once | INTRAVENOUS | Status: DC | PRN

## 2024-02-05 MED ORDER — SODIUM CHLORIDE 0.9 % IV SOLN
1.0000 g | Freq: Once | INTRAVENOUS | Status: AC
  Administered 2024-02-05: 1 g via INTRAVENOUS
  Filled 2024-02-05: qty 10

## 2024-02-05 MED ORDER — SODIUM CHLORIDE 0.9% FLUSH: 10.0000 mL | INTRAVENOUS | Status: DC | PRN

## 2024-02-05 MED ORDER — POLYETHYLENE GLYCOL 3350 17 G PO PACK
17.0000 g | PACK | Freq: Every day | ORAL | Status: DC | PRN
  Administered 2024-02-12: 17 g via ORAL
  Filled 2024-02-05 (×2): qty 1

## 2024-02-05 MED ORDER — SODIUM CHLORIDE 0.9 % IV SOLN
INTRAVENOUS | Status: AC
Start: 2024-02-05 — End: 2024-02-05

## 2024-02-05 MED ORDER — MELATONIN 5 MG PO TABS
5.0000 mg | ORAL_TABLET | Freq: Every evening | ORAL | Status: DC | PRN
  Administered 2024-02-07: 5 mg via ORAL
  Filled 2024-02-05: qty 1

## 2024-02-05 MED ORDER — SODIUM CHLORIDE 0.9 % IV BOLUS
1000.0000 mL | Freq: Once | INTRAVENOUS | Status: AC
  Administered 2024-02-05: 1000 mL via INTRAVENOUS

## 2024-02-05 MED ORDER — SODIUM CHLORIDE 0.9 % IV SOLN
2.0000 g | INTRAVENOUS | Status: DC
  Administered 2024-02-06 – 2024-02-09 (×4): 2 g via INTRAVENOUS
  Filled 2024-02-05 (×5): qty 20

## 2024-02-05 MED ORDER — MORPHINE SULFATE (PF) 2 MG/ML IV SOLN: 2.0000 mg | Freq: Once | INTRAVENOUS | Status: DC

## 2024-02-05 MED ORDER — PROCHLORPERAZINE EDISYLATE 10 MG/2ML IJ SOLN
5.0000 mg | Freq: Four times a day (QID) | INTRAMUSCULAR | Status: DC | PRN
  Administered 2024-02-06 – 2024-02-07 (×2): 5 mg via INTRAVENOUS
  Filled 2024-02-05 (×2): qty 2

## 2024-02-05 MED ORDER — ENOXAPARIN SODIUM 40 MG/0.4ML IJ SOSY
40.0000 mg | PREFILLED_SYRINGE | INTRAMUSCULAR | Status: DC
  Administered 2024-02-06 – 2024-02-13 (×8): 40 mg via SUBCUTANEOUS
  Filled 2024-02-05 (×8): qty 0.4

## 2024-02-05 MED ORDER — ACETAMINOPHEN 325 MG PO TABS
650.0000 mg | ORAL_TABLET | Freq: Four times a day (QID) | ORAL | Status: DC | PRN
  Administered 2024-02-06: 650 mg via ORAL
  Filled 2024-02-05: qty 2

## 2024-02-05 NOTE — ED Notes (Signed)
Patient very restless in bed.  Pulling at leads, etc.  Family at bedside

## 2024-02-05 NOTE — ED Provider Notes (Signed)
[facility] AT [facility] Provider Note   CSN: [id number] Arrival date & time: 02/05/24  1240     History  Chief Complaint  Patient presents with   Fatigue    Zayvon Alicea is a 58 y.o. male.  HPI   58 year old male with diagnosis of gastric cancer currently undergoing first round of chemotherapy presents emergency department after change in mental status.  Patient had his first dose of chemotherapy on Tuesday, since then he has had a significant decline.  He is generally weak, at times confused.  He is having the urge to use the restroom but produces minimal urine.  He has had nausea vomiting and he said decreased p.o. intake due to a sense of fullness related to his gastric cancer diagnosis.  Otherwise patient and family deny any acute fever, chest pain, infectious symptoms.  He is not complaining of any headache, vision changes or acute focal neurologic findings.  Home Medications Prior to Admission medications   Medication Sig Start Date End Date Taking? Authorizing Provider  omeprazole (PRILOSEC) 20 MG capsule Take 20 mg by mouth daily.   Yes [provider]  prochlorperazine (COMPAZINE) 10 MG tablet Take 1 tablet (10 mg total) by mouth every 6 (six) hours as needed for nausea. 01/27/24  Yes Ladene Artist, MD  traMADol (ULTRAM) 50 MG tablet Take 1 tablet (50 mg total) by mouth every 8 (eight) hours as needed. Do not drive while taking pain medication. 01/27/24  Yes Rana Snare, NP  lidocaine-prilocaine (EMLA) cream Apply 1 Application topically as directed. Apply 1/2 tablespoon to port site 1-2 hours prior to stick and cover with Press-and-Seal to numb port site. Do not start until 14 days of port placement. Patient not taking: Reported on 02/01/2024 01/27/24   Ladene Artist, MD  LORazepam (ATIVAN) 0.5 MG tablet Take 1 tablet (0.5 mg total) by mouth at bedtime. Patient not taking: Reported on 02/05/2024 02/03/24   Ladene Artist, MD   ondansetron (ZOFRAN) 8 MG tablet Take 1 tablet (8 mg total) by mouth every 8 (eight) hours as needed for nausea. May start 72 hours after chemotherapy treatment given due to receiving Aloxi on day 1 Patient not taking: Reported on 02/01/2024 01/27/24   Ladene Artist, MD      Allergies    Patient has no known allergies.    Review of Systems   Review of Systems  Constitutional:  Positive for appetite change and fatigue. Negative for fever.  Respiratory:  Negative for shortness of breath.   Cardiovascular:  Negative for chest pain.  Gastrointestinal:  Positive for nausea and vomiting. Negative for abdominal pain and diarrhea.  Genitourinary:  Positive for difficulty urinating.  Musculoskeletal:  Negative for neck pain and neck stiffness.  Skin:  Negative for rash.  Neurological:  Negative for headaches.  Psychiatric/Behavioral:  Positive for confusion.     Physical Exam Updated Vital Signs BP 120/84 (BP Location: Right Arm)   Pulse 85   Temp (!) 97.5 F (36.4 C) (Oral)   Resp 20   Ht 6' (1.829 m)   Wt 89.6 kg   SpO2 98%   BMI 26.79 kg/m  Physical Exam Vitals and nursing note reviewed.  Constitutional:      General: He is not in acute distress.    Appearance: Normal appearance.  HENT:     Head: Normocephalic.     Mouth/Throat:     Mouth: Mucous membranes are moist.  Eyes:  Extraocular Movements: Extraocular movements intact.     Pupils: Pupils are equal, round, and reactive to light.  Cardiovascular:     Rate and Rhythm: Normal rate.  Pulmonary:     Effort: Pulmonary effort is normal. No respiratory distress.  Abdominal:     Palpations: Abdomen is soft.     Tenderness: There is no abdominal tenderness.  Musculoskeletal:     Cervical back: No rigidity or tenderness.  Skin:    General: Skin is warm.  Neurological:     Mental Status: He is alert.     Comments: Oriented x 3 but pauses before answering, seems slightly confused but nonfocal on neuroexam   Psychiatric:        Mood and Affect: Mood normal.     ED Results / Procedures / Treatments   Labs (all labs ordered are listed, but only abnormal results are displayed) Labs Reviewed  CBC WITH DIFFERENTIAL/PLATELET  COMPREHENSIVE METABOLIC PANEL  MAGNESIUM  URINALYSIS, ROUTINE W REFLEX MICROSCOPIC  LIPASE, BLOOD    EKG None  Radiology No results found.  Procedures Procedures    Medications Ordered in ED Medications - No data to display  ED Course/ Medical Decision Making/ A&P                                 Medical Decision Making Amount and/or Complexity of Data Reviewed Labs: ordered.   58 year old male with diagnosis of gastric cancer undergoing first round of chemotherapy presents emergency department with general decline.  He is experiencing significant weakness, confusion, decreased p.o. intake, decreased urine production.  Earlier today patient was able to answer his name or baseline orientation questions which was what prompted him to come here for evaluation.  He is nonfocal on his neuroexam, vitals are baseline.  Concern for more of a metabolic origin for infectious origin of confusion however may consider head imaging depending on what the lab results show in regards to diagnosis of cancer.  Patient signed out pending results.        Final Clinical Impression(s) / ED Diagnoses Final diagnoses:  None    Rx / DC Orders ED Discharge Orders     None         Rozelle Logan, DO 02/05/24 1500

## 2024-02-05 NOTE — ED Triage Notes (Signed)
Patient brought here from cancer center.  States patient came from home being lethargic and confused.  Not knowing his name or where he was.  Port accessed in center and fluid bolus 500 cc given.  Patient more awake and able to respond but difficulty with orientation questions.  Is following commands.

## 2024-02-05 NOTE — Telephone Encounter (Signed)
Called Mrs. Eckard to f/u on patient call to [facility] last night for chills, dizziness and blurred vision. Blurred vision is better, very weak, not eating or drinking, having nausea. Informed her that we will give him a liter of NS today when he comes in for pump d/c and can give IV nausea med if needed. She agrees to plan.

## 2024-02-05 NOTE — ED Provider Notes (Signed)
Patient was in the early seen by Dr. Wilkie Aye.  Please see his note.  Patient currently undergoing chemotherapy treatment for gastric cancer.  He came to the ED with increasing confusion weakness decreased p.o. intake. Physical Exam  BP 115/79 (BP Location: Right Arm)   Pulse 86   Temp 97.8 F (36.6 C) (Oral)   Resp 20   Ht 1.829 m (6')   Wt 89.6 kg   SpO2 99%   BMI 26.79 kg/m   Physical Exam Please see Dr. Winnifred Friar note Procedures  Procedures  ED Course / MDM   Clinical Course as of 02/06/24 1513  Thu Feb 05, 2024  1618 Ammonia(!) Ammonia level slightly increased at 39 doubt clinically significant.  Lipase is normal. [JK]  1619 Comprehensive metabolic panel(!) [JK]  1619 Comprehensive metabolic panel(!) BUN and creatinine are inked increased compared to 2 days ago.  Consistent with dehydration [JK]  1649 Urinalysis not suggestive of infection [JK]  1858 Chest x-ray shows possible pulmonary edema versus multifocal pneumonia [JK]  1914 Case discussed with Dr Haroldine Laws regarding admission. [JK]    Clinical Course User Index [JK] Linwood Dibbles, MD   Medical Decision Making Amount and/or Complexity of Data Reviewed Labs: ordered. Decision-making details documented in ED Course. Radiology: ordered.  Risk Prescription drug management. Decision regarding hospitalization.   Patient's ED workup is notable for hyponatremia as well as an AKI with increased BUN and creatinine.  No significant leukocytosis.  No evidence of urinary tract infection.  Chest x-ray suggest the possibility of pneumonia.  Family states patient still having some confusion.  He is forgetting names of family members.  MRI is currently pending.  Symptoms may be multifactorial.  It is possible it could be related to acute infection versus his AKI chemotherapy treatment.  Will plan on admission to the hospital for further treatment      Linwood Dibbles, MD 02/06/24 1513

## 2024-02-05 NOTE — Progress Notes (Signed)
Patient was seen today for his pump stop appointment and IVF rehydration due to complain of nausea and vomiting by his wife and daughter. Patient appeared to be lethargic and disoriented. Vitals within normal limits. Dr. Truett Perna and Misty Stanley, NP saw patient in the infusion suite for further evaluation. STAT CMET drawn. Per physician order, patient was transferred and transported to the ED in the company of his wife and daughter for further evaluation and intervention.

## 2024-02-05 NOTE — ED Notes (Signed)
 [facility] at bedside

## 2024-02-05 NOTE — H&P (Incomplete)
History and Physical  [name] ZOX:[id number] DOB: September 18, 1966 DOA: 02/05/2024  Referring physician: Accepted by Dr. Joneen Roach [facility], hospitalist service. PCP: Tiffany Kocher, DO  Outpatient Specialists: Medical oncology. Patient coming from: Home through the cancer center.  Chief Complaint: Altered mental status.  HPI: Henry May is a 58 y.o. 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  Earlier this week he had his first chemotherapy of FOLFOX.  Reportedly since then he has gotten progressively weaker with nausea, vomiting, and altered mental status.  Today at the cancer center, due to concern for his generalized weakness and confusion, the patient was sent to the ER for further evaluation.  In the ER, MRI brain with and without contrast was nonacute.  Chest x-ray showing patchy perihilar airspace disease suggestive of pulmonary edema versus multifocal pneumonia.  Since pneumonia could not be ruled out, the patient was started on Rocephin and azithromycin empirically in the ER.  TR[facility], hospitalist team was asked to admit.  The patient was accepted by Dr. Joneen Roach, and transferred to [facility] MedSurg unit as inpatient status.  At the time of visit the patient is alert and confused.  He is able to follow simple commands but minimally.  ED Course: Temperature 97.9.  BP 113/88, pulse 95, respiratory 20, O2 saturation 95% on room air.  Lab studies notable for serum sodium 129, potassium 5.3, serum glucose 147, BUN 57, creatinine 1.59, magnesium 2.7, AST 66, ALT 75, GFR 50, BNP 420.  Review of Systems: Review of systems as noted in the HPI. All other systems reviewed and are negative.   Past Medical History:  Diagnosis Date   Acute respiratory failure (HCC) 08/06/2020   Chronic cough    SINCE 08-06-2020 COVID PNEUMONIA   COVID 08/06/2020    Dvt femoral (deep venous thrombosis) (HCC) 08/2020   LEFT LEG    History of blood transfusion 08/2020   AFTER MI 5 UNITS GIVEN   History of kidney stones    Hypertension    Numbness    LEFT LEG AT TIMES   Pneumonia 08/06/2020   COVID PNEUMONIA   Past Surgical History:  Procedure Laterality Date   CYSTOSCOPY WITH STENT PLACEMENT Left 10/06/2020   Procedure: CYSTOSCOPY WITH STENT PLACEMENT;  Surgeon: Rene Paci, MD;  Location: [facility];  Service: Urology;  Laterality: Left;   CYSTOSCOPY/URETEROSCOPY/HOLMIUM LASER/STENT PLACEMENT Left 11/14/2020   Procedure: CYSTOSCOPY/URETEROSCOPY/HOLMIUM LASER/STENT PLACEMENT;  Surgeon: Rene Paci, MD;  Location: [facility];  Service: Urology;  Laterality: Left;  ONLY NEEDS 45 MIN   IR IMAGING GUIDED PORT INSERTION  01/22/2024   IR IVC FILTER PLMT / S&I /IMG GUID/MOD SED  08/24/2020   IR PARACENTESIS  01/22/2024    Social History:  reports that he quit smoking about 39 years ago. His smoking use included cigarettes. He has never been exposed to tobacco smoke. He has never used smokeless tobacco. He reports current alcohol use. He reports that he does not use drugs.   No Known Allergies  Family History  Problem Relation Age of Onset   Cancer Mother        type unknown   Diabetes Mellitus II Neg Hx       Prior to Admission medications   Medication Sig Start Date End Date Taking? Authorizing Provider  omeprazole (PRILOSEC) 20 MG capsule Take 20 mg by mouth daily.   Yes [provider]  prochlorperazine (  COMPAZINE) 10 MG tablet Take 1 tablet (10 mg total) by mouth every 6 (six) hours as needed for nausea. 01/27/24  Yes Ladene Artist, MD  traMADol (ULTRAM) 50 MG tablet Take 1 tablet (50 mg total) by mouth every 8 (eight) hours as needed. Do not drive while taking pain medication. 01/27/24  Yes Rana Snare, NP  lidocaine-prilocaine (EMLA) cream Apply 1 Application topically as directed. Apply 1/2 tablespoon to port site  1-2 hours prior to stick and cover with Press-and-Seal to numb port site. Do not start until 14 days of port placement. Patient not taking: Reported on 02/01/2024 01/27/24   Ladene Artist, MD  LORazepam (ATIVAN) 0.5 MG tablet Take 1 tablet (0.5 mg total) by mouth at bedtime. Patient not taking: Reported on 02/05/2024 02/03/24   Ladene Artist, MD  ondansetron (ZOFRAN) 8 MG tablet Take 1 tablet (8 mg total) by mouth every 8 (eight) hours as needed for nausea. May start 72 hours after chemotherapy treatment given due to receiving Aloxi on day 1 Patient not taking: Reported on 02/01/2024 01/27/24   Ladene Artist, MD    Physical Exam: BP 109/85 (BP Location: Right Arm)   Pulse 92   Temp 98.2 F (36.8 C) (Axillary)   Resp 18   Ht 6' (1.829 m)   Wt 89.6 kg   SpO2 95%   BMI 26.79 kg/m   General: 58 y.o. year-old male well developed well nourished in no acute distress.  Alert and confused. Cardiovascular: Regular rate and rhythm with no rubs or gallops.  No thyromegaly or JVD noted.  No lower extremity edema. 2/4 pulses in all 4 extremities. Respiratory: Clear to auscultation with no wheezes or rales. Good inspiratory effort. Abdomen: Soft nontender nondistended with normal bowel sounds x4 quadrants. Muskuloskeletal: No cyanosis, clubbing or edema noted bilaterally Neuro: CN II-XII intact, strength, sensation, reflexes Skin: No ulcerative lesions noted or rashes Psychiatry: Judgement and insight appear altered. Mood is appropriate for condition and setting          Labs on Admission:  Basic Metabolic Panel: Recent Labs  Lab 02/01/24 0431 02/02/24 0615 02/02/24 1615 02/03/24 0825 02/05/24 1208 02/05/24 1514  NA 131* 131* 131* 129* 132* 129*  K 4.8 4.8 4.8 4.9 4.7 5.3*  CL 98 98 99 96* 99 93*  CO2 19* 21* 24 24 21* 24  GLUCOSE 119* 121* 127* 148* 155* 147*  BUN 41* 37* 33* 33* 56* 57*  CREATININE 1.44* 1.46* 1.12 1.31* 1.41* 1.59*  CALCIUM 8.7* 8.5* 8.1* 8.8* 7.4* 8.5*  MG  2.6*  --   --   --   --  2.7*   Liver Function Tests: Recent Labs  Lab 02/01/24 0431 02/03/24 0825 02/05/24 1208 02/05/24 1514  AST 33 22 57* 66*  ALT 37 20 65* 75*  ALKPHOS 85 84 71 84  BILITOT 0.4 0.5 0.4 0.5  PROT 7.3 6.7 5.5* 6.4*  ALBUMIN 3.0* 3.3* 2.9* 3.4*   Recent Labs  Lab 02/01/24 0431 02/05/24 1514  LIPASE 106* 39   Recent Labs  Lab 02/05/24 1514  AMMONIA 39*   CBC: Recent Labs  Lab 02/01/24 0431 02/02/24 0615 02/03/24 0825 02/05/24 1514  WBC 13.2* 12.0* 16.1* 9.9  NEUTROABS  --   --  14.2* 8.9*  HGB 18.1* 16.6 16.8 16.5  HCT 53.0* 51.3 50.2 49.5  MCV 90.0 93.1 89.2 89.4  PLT 319 326 317 303   Cardiac Enzymes: No results for input(s): "CKTOTAL", "CKMB", "CKMBINDEX", "TROPONINI"  in the last 168 hours.  BNP (last 3 results) Recent Labs    02/05/24 1514  BNP 420.4*    ProBNP (last 3 results) No results for input(s): "PROBNP" in the last 8760 hours.  CBG: No results for input(s): "GLUCAP" in the last 168 hours.  Radiological Exams on Admission: MR Brain W and Wo Contrast Result Date: 02/05/2024 CLINICAL DATA:  Provided history: Mental status change, unknown cause. Additional history provided: History of gastric cancer. EXAM: MRI HEAD WITHOUT AND WITH CONTRAST TECHNIQUE: Multiplanar, multiecho pulse sequences of the brain and surrounding structures were obtained without and with intravenous contrast. CONTRAST:  9mL GADAVIST GADOBUTROL 1 MMOL/ML IV SOLN COMPARISON:  None. FINDINGS: Intermittently motion degraded examination. Most notably, the axial T1-weighted post-contrast sequence is mild-to-moderately motion degraded. Within this limitation, findings are as follows. Brain: Cerebral volume is normal. No cortical encephalomalacia is identified. No significant cerebral white matter disease for age. There is no acute infarct. No evidence of an intracranial mass. No chronic intracranial blood products. No extra-axial fluid collection. No midline shift. No  pathologic intracranial enhancement identified. Vascular: Maintained flow voids within the proximal large arterial vessels. 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 Skull and upper cervical spine: No focal worrisome marrow lesion. Sinuses/Orbits: No mass or acute finding within the imaged orbits. 10 mm mucous retention cyst within the left sphenoid sinus. Minimal mucosal thickening within right ethmoid air cells. IMPRESSION: 1. Intermittently motion degraded examination (most notably, there is mild-to-moderate motion degradation of the axial T1-weighted post-contrast sequence). 2. Within this limitation, no evidence of intracranial metastatic disease or an acute intracranial abnormality. 3. 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 4. Otherwise unremarkable MRI appearance of the brain for age. 5. Mild paranasal sinus disease as described. Electronically Signed   By: Jackey Loge D.O.   On: 02/05/2024 18:56   DG Chest Portable 1 View Result Date: 02/05/2024 CLINICAL DATA:  Weakness. EXAM: PORTABLE CHEST 1 VIEW COMPARISON:  08/22/2020 FINDINGS: Port in the anterior chest wall with tip in distal SVC. Normal cardiac silhouette. Low lung volumes. There is patchy perihilar airspace disease. No pneumothorax. No focal consolidation. IMPRESSION: Patchy perihilar airspace disease suggests pulmonary edema versus multifocal pneumonia. Electronically Signed   By: Genevive Bi M.D.   On: 02/05/2024 15:50    EKG: I independently viewed the EKG done and my findings are as followed: Sinus rhythm rate of 87.  Nonspecific ST-T changes.  QTc 473.  Assessment/Plan Present on Admission:  Acute kidney injury (AKI) with acute tubular necrosis (ATN) (HCC)  Principal Problem:   Acute kidney injury (AKI) with acute tubular necrosis (ATN) (HCC)  AKI, suspect prerenal in the setting of dehydration from poor oral intake Presented with  creatinine 1.59 with GFR 50, BUN 57. Baseline creatinine 1.1 with GFR greater than 60 Received IV fluid hydration in the ER. Repeat chemistry panel. Avoid nephrotoxic agents and hypotension. Monitor urine output  Hyponatremia suspect from decreased oral intake Serum sodium 129 Per spouse at bedside, the patient has had difficulties with swallowing Speech therapist has been consulted to evaluate Monitor serum sodium level with repeat CMP  Acute metabolic encephalopathy MRI brain with and without contrast was non-revealing Reorient as needed, maintain sleep and wake cycle Fall precautions, aspiration precautions Delirium precautions are in place If no improvement may consider EEG evaluation  Acute CHF BNP greater than 400 Follow 2D echo Start strict I's and O's and daily weight  Hyperkalemia Serum potassium 5.3 Repeat  level, if elevated, treat as indicated.  Dysphagia Speech therapist evaluation May benefit from barium swallow evaluation, defer to speech therapist Aspiration precautions N.p.o. until passes a swallow test.  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 Earlier this week he had his first chemotherapy of FOLFOX. Consult medical oncology in the morning.   Critical care time: 55 minutes.   DVT prophylaxis: Subcu Lovenox daily.  Code Status: Full code.  Family Communication: Spouse and daughter at bedside.  Disposition Plan: Admitted to MedSurg unit.  Consults called: None.  Admission status: Inpatient status.   Status is: Inpatient The patient requires at least 2 midnights for further evaluation and treatment of present  condition.   Darlin Drop MD [facility] Pager [phone]  If 7PM-7AM, please contact night-coverage [email or web address] Password [email or web address]  02/05/2024, 11:03 PM

## 2024-02-05 NOTE — Progress Notes (Signed)
Henry May OFFICE PROGRESS NOTE   Diagnosis: Gastric cancer  INTERVAL HISTORY:   Henry May completed cycle 1 FOLFOX beginning 02/03/2024.  He presented to the office today for pump DC.  He was noted to be confused.  He is accompanied by his daughter.  She reports he had nausea/vomiting and became weak yesterday.  He had sweats during the night.  No documented fever.  This morning he was noted to be confused.  He was unable to ambulate independently.  He is very weak.  Daughter reports he has been complaining of pain on the left foot and right hip.  No falls.  Poor oral intake.  Objective:  Vital signs in last 24 hours:  Blood pressure 115/84, pulse 85, temperature 98.2 F (36.8 C), temperature source Temporal, resp. rate (!) 22, SpO2 98%.    HEENT: No thrush. Resp: Scattered wheezes and rhonchi.  No respiratory distress. Cardio: Distant heart sounds. GI: Abdomen is distended. Vascular: No leg edema. Neuro: He is awake.  He is confused.  Intermittently follows commands.  He is able to move all extremities.  Limited verbal response.  Skin: Warm, dry.  Mild facial flushing. Musculoskeletal: Nontender over the right hip and left foot. Port-A-Cath without erythema.  Lab Results:  Lab Results  Component Value Date   WBC 16.1 (H) 02/03/2024   HGB 16.8 02/03/2024   HCT 50.2 02/03/2024   MCV 89.2 02/03/2024   PLT 317 02/03/2024   NEUTROABS 14.2 (H) 02/03/2024    Imaging:  No results found.  Medications: I have reviewed the patient's current medications.  Assessment/Plan: Gastric cancer 11/21/2023 EGD-gastric body with "infiltrative looking lesion"; biopsy shows involvement of a hypercellular lesion that suggests diffuse carcinoma by morphology CTs abdomen/pelvis 11/28/2023-diffuse fatty infiltration of the liver; gastric wall thickening; lymph nodes up to 6 mm in diameter near the stomach wall. 12/31/2023 upper endoscopy-large diffuse friable infiltrative  polypoid and ulcerated circumferential mass found in the gastric body.  Scope was passed beyond the mass with normal antrum/pylorus.  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  Negative for HER2 (1+); mismatch repair protein IHC normal; PD-L1 CPS 0% PET scan 01/07/2024-circumferential hypermetabolic gastric mucosal thickening.  Ill-defined hypermetabolic lymph nodes in the gastrohepatic ligament.  Intense hypermetabolic activity associated with the right lobe of the thyroid gland.  Small hypermetabolic right axillary node favored reactive. Biopsy omental/peritoneal thickening 01/22/2024-poorly differentiated adenocarcinoma with focal signet ring cell features Paracentesis 01/22/2024-ascites positive for malignancy, adenocarcinoma Cycle 1 FOLFOX 02/03/2024 Early satiety, postprandial abdominal pain, weight loss secondary to #1 History of bilateral lower extremity DVT 2021-anticoagulation discontinued due to massive retroperitoneal bleed, IVC filter placed 08/24/2020 Hospitalized with acute respiratory failure due to COVID-19 08/06/2020 - 09/04/2020 History of massive retroperitoneal bleed secondary to anticoagulation September 2021 Thyroid ultrasound 01/13/2024-no abnormal nodule identified in the right lobe.  1.1 cm thyroid isthmus nodule meets criteria for 1 year follow-up ultrasound. Admission 02/01/2024 with increased abdominal pain/ascites  Disposition: Henry May completed cycle 1 FOLFOX beginning 02/03/2024.  He presented to the office today for pump DC.  Family reports an acute change in his mental status initially noted when he woke up this morning.  He is very confused, not following commands consistently.  We discussed the possibility of infection/sepsis, stroke.  Current presentation is not likely related to chemotherapy.  I spoke with the charge nurse at  [facility] ED. We are transporting him there for urgent  evaluation.  Patient seen with Dr. Truett Perna.    Lonna Cobb ANP/GNP-BC   02/05/2024  12:42 PM  This was a shared visit with Lonna Cobb.  Henry May is interviewed and examined.  His daughter was present for the visit today.  He presents that day 3 following cycle 1 FOLFOX with an acute change in his mental status.  He appears disoriented.  He moves all extremities and follows some commands.  His wife called the answering service last night with a report of chills, dizziness, and blurred vision.  The differential diagnosis includes a systemic infection, CVA, and CNS metastases.  I doubt his symptoms are related to acute toxicity from chemotherapy.  He will be referred to the emergency room for further evaluation.  I was present for greater than 50% of today's visit.  I performed Medical Decision Making.  Mancel Bale, MD

## 2024-02-06 DIAGNOSIS — N170 Acute kidney failure with tubular necrosis: Secondary | ICD-10-CM | POA: Diagnosis not present

## 2024-02-06 LAB — COMPREHENSIVE METABOLIC PANEL
ALT: 62 U/L — ABNORMAL HIGH (ref 0–44)
AST: 56 U/L — ABNORMAL HIGH (ref 15–41)
Albumin: 2.6 g/dL — ABNORMAL LOW (ref 3.5–5.0)
Alkaline Phosphatase: 63 U/L (ref 38–126)
Anion gap: 14 (ref 5–15)
BUN: 47 mg/dL — ABNORMAL HIGH (ref 6–20)
CO2: 19 mmol/L — ABNORMAL LOW (ref 22–32)
Calcium: 8.1 mg/dL — ABNORMAL LOW (ref 8.9–10.3)
Chloride: 97 mmol/L — ABNORMAL LOW (ref 98–111)
Creatinine, Ser: 1.22 mg/dL (ref 0.61–1.24)
GFR, Estimated: >60 mL/min (ref >60)
Glucose, Bld: 127 mg/dL — ABNORMAL HIGH (ref 70–99)
Potassium: 4.5 mmol/L (ref 3.5–5.1)
Sodium: 130 mmol/L — ABNORMAL LOW (ref 135–145)
Total Bilirubin: 1 mg/dL (ref 0.0–1.2)
Total Protein: 5.7 g/dL — ABNORMAL LOW (ref 6.5–8.1)

## 2024-02-06 LAB — MAGNESIUM: Magnesium: 2.9 mg/dL — ABNORMAL HIGH (ref 1.7–2.4)

## 2024-02-06 LAB — CBC
HCT: 45.9 % (ref 39.0–52.0)
Hemoglobin: 15 g/dL (ref 13.0–17.0)
MCH: 29.9 pg (ref 26.0–34.0)
MCHC: 32.7 g/dL (ref 30.0–36.0)
MCV: 91.6 fL (ref 80.0–100.0)
Platelets: 256 10*3/uL (ref 150–400)
RBC: 5.01 MIL/uL (ref 4.22–5.81)
RDW: 12.4 % (ref 11.5–15.5)
WBC: 9.5 10*3/uL (ref 4.0–10.5)
nRBC: 0 % (ref 0.0–0.2)

## 2024-02-06 LAB — PHOSPHORUS: Phosphorus: 3.6 mg/dL (ref 2.5–4.6)

## 2024-02-06 LAB — PROCALCITONIN: Procalcitonin: 2.64 ng/mL

## 2024-02-06 MED ORDER — SODIUM CHLORIDE 0.9% FLUSH
10.0000 mL | Freq: Two times a day (BID) | INTRAVENOUS | Status: DC
  Administered 2024-02-06 – 2024-02-12 (×4): 10 mL

## 2024-02-06 MED ORDER — CHLORHEXIDINE GLUCONATE CLOTH 2 % EX PADS
6.0000 | MEDICATED_PAD | Freq: Every day | CUTANEOUS | Status: DC
  Administered 2024-02-06 – 2024-02-13 (×8): 6 via TOPICAL

## 2024-02-06 MED ORDER — BOOST / RESOURCE BREEZE PO LIQD CUSTOM
1.0000 | Freq: Three times a day (TID) | ORAL | Status: DC
  Administered 2024-02-06 – 2024-02-13 (×5): 1 via ORAL

## 2024-02-06 MED ORDER — LACTATED RINGERS IV SOLN: Freq: Once | INTRAVENOUS | Status: AC

## 2024-02-06 MED ORDER — ALTEPLASE 2 MG IJ SOLR
2.0000 mg | Freq: Once | INTRAMUSCULAR | Status: AC
  Administered 2024-02-06: 2 mg
  Filled 2024-02-06: qty 2

## 2024-02-06 MED ORDER — LACTATED RINGERS IV SOLN
Freq: Once | INTRAVENOUS | Status: AC
Start: 2024-02-06 — End: 2024-02-06

## 2024-02-06 MED ORDER — IPRATROPIUM-ALBUTEROL 0.5-2.5 (3) MG/3ML IN SOLN: 3.0000 mL | RESPIRATORY_TRACT | Status: DC | PRN

## 2024-02-06 MED ORDER — SODIUM CHLORIDE 0.9% FLUSH: 10.0000 mL | INTRAVENOUS | Status: DC | PRN

## 2024-02-06 MED ORDER — HALOPERIDOL LACTATE 5 MG/ML IJ SOLN
1.0000 mg | Freq: Once | INTRAMUSCULAR | Status: AC
  Administered 2024-02-06: 1 mg via INTRAMUSCULAR
  Filled 2024-02-06: qty 1

## 2024-02-06 NOTE — Progress Notes (Addendum)
Henry May   DOB:Sep 30, 1966   ZO#:[id number]      ASSESSMENT & PLAN:  1.  Altered mental status Acute metabolic encephalopathy - Patient admitted 02/05/2024 with confusion.  It was noted when he went to outpatient oncology office for chemotherapy pump disconnection but he seemed to be confused. - MRI of brain was done on 02/05/2024 which showed no evidence of intracranial mets or acute intracranial abnormality. - Unclear etiology at this point - Continue to monitor closely  2.  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  CT scans and PET scan subsequently done which showed lymph node involvement. - Decision was made to start him on FOLFOX chemotherapy.  Cycle 1 started 02/03/2024 and pump was disconnected as stated 02/05/2024. - Medical oncology/Dr. Truett Perna following patient closely  3.  History of bilateral lower extremity DVT - In 2021 - Anticoagulation was discontinued due to massive retroperitoneal bleed and patient had IVC filter placed 08/24/2020.  4.  Hyponatremia - Electrolyte replacement per Medicine  5.  Transaminitis - Monitor CMP. - T. bili WNL   Code Status Full  Subjective:  Patient seen awake and alert, laying calmly in bed although remains with altered mental status.  Monosyllable answers to questions.  Denies pain.  Family at bedside report patient is actually much better mentally than when he was brought in.  No other acute distress noted at this time.  Objective:  Vitals:   02/06/24 0200 02/06/24 0532  BP: 113/88 108/78  Pulse: 95 86  Resp: 20 16  Temp: 97.9 F (36.6 C) 97.9 F (36.6 C)  SpO2: 95% 95%     Intake/Output Summary (Last 24 hours) at 02/06/2024 9811 Last data filed at 02/06/2024 0600 Gross per 24 hour  Intake 511.73 ml  Output 500 ml  Net 11.73 ml     REVIEW OF SYSTEMS: Unable to fully obtain Behavioral/Psych: +  Altered mental status All other systems were reviewed with the patient and are negative.  PHYSICAL EXAMINATION: ECOG PERFORMANCE STATUS: 3 - Symptomatic, >50% confined to bed  Vitals:   02/06/24 0200 02/06/24 0532  BP: 113/88 108/78  Pulse: 95 86  Resp: 20 16  Temp: 97.9 F (36.6 C) 97.9 F (36.6 C)  SpO2: 95% 95%   Filed Weights   02/05/24 1253  Weight: 197 lb 8.5 oz (89.6 kg)    GENERAL: alert, + altered mental status  SKIN: skin color, texture, turgor are normal, no rashes or significant lesions EYES: normal, conjunctiva are pink and non-injected, sclera clear OROPHARYNX: no exudate, no erythema and lips, buccal mucosa, and tongue normal  NECK: supple, thyroid normal size, non-tender, without nodularity LYMPH: no palpable lymphadenopathy in the cervical, axillary or inguinal LUNGS: clear to auscultation and percussion with normal breathing effort HEART: regular rate & rhythm and no murmurs and no lower extremity edema ABDOMEN: abdomen soft, non-tender and normal bowel sounds MUSCULOSKELETAL: no cyanosis of digits and no clubbing  PSYCH: alert +confused  NEURO: no focal motor/sensory deficits   All questions were answered. The patient knows to call the clinic with any problems, questions or concerns.   The total time spent in the appointment was 55 minutes encounter with patient including review of chart and various tests results, discussions about plan of care and coordination of care plan  Dawson Bills, NP 02/06/2024 9:21 AM    Labs Reviewed:  Lab Results  Component Value Date   WBC  9.5 02/06/2024   HGB 15.0 02/06/2024   HCT 45.9 02/06/2024   MCV 91.6 02/06/2024   PLT 256 02/06/2024   Recent Labs    02/05/24 1208 02/05/24 1514 02/06/24 0635  NA 132* 129* 130*  K 4.7 5.3* 4.5  CL 99 93* 97*  CO2 21* 24 19*  GLUCOSE 155* 147* 127*  BUN 56* 57* 47*  CREATININE 1.41* 1.59* 1.22  CALCIUM 7.4* 8.5* 8.1*  GFRNONAA 57* 50* >60  PROT 5.5* 6.4* 5.7*   ALBUMIN 2.9* 3.4* 2.6*  AST 57* 66* 56*  ALT 65* 75* 62*  ALKPHOS 71 84 63  BILITOT 0.4 0.5 1.0    Studies Reviewed:  MR Brain W and Wo Contrast Result Date: 02/05/2024 CLINICAL DATA:  Provided history: Mental status change, unknown cause. Additional history provided: History of gastric cancer. EXAM: MRI HEAD WITHOUT AND WITH CONTRAST TECHNIQUE: Multiplanar, multiecho pulse sequences of the brain and surrounding structures were obtained without and with intravenous contrast. CONTRAST:  9mL GADAVIST GADOBUTROL 1 MMOL/ML IV SOLN COMPARISON:  None. FINDINGS: Intermittently motion degraded examination. Most notably, the axial T1-weighted post-contrast sequence is mild-to-moderately motion degraded. Within this limitation, findings are as follows. Brain: Cerebral volume is normal. No cortical encephalomalacia is identified. No significant cerebral white matter disease for age. There is no acute infarct. No evidence of an intracranial mass. No chronic intracranial blood products. No extra-axial fluid collection. No midline shift. No pathologic intracranial enhancement identified. Vascular: Maintained flow voids within the proximal large arterial vessels. 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 Skull and upper cervical spine: No focal worrisome marrow lesion. Sinuses/Orbits: No mass or acute finding within the imaged orbits. 10 mm mucous retention cyst within the left sphenoid sinus. Minimal mucosal thickening within right ethmoid air cells. IMPRESSION: 1. Intermittently motion degraded examination (most notably, there is mild-to-moderate motion degradation of the axial T1-weighted post-contrast sequence). 2. Within this limitation, no evidence of intracranial metastatic disease or an acute intracranial abnormality. 3. 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 4. Otherwise unremarkable MRI appearance of the brain for age. 5.  Mild paranasal sinus disease as described. Electronically Signed   By: Jackey Loge D.O.   On: 02/05/2024 18:56   DG Chest Portable 1 View Result Date: 02/05/2024 CLINICAL DATA:  Weakness. EXAM: PORTABLE CHEST 1 VIEW COMPARISON:  08/22/2020 FINDINGS: Port in the anterior chest wall with tip in distal SVC. Normal cardiac silhouette. Low lung volumes. There is patchy perihilar airspace disease. No pneumothorax. No focal consolidation. IMPRESSION: Patchy perihilar airspace disease suggests pulmonary edema versus multifocal pneumonia. Electronically Signed   By: Genevive Bi M.D.   On: 02/05/2024 15:50   US Paracentesis Result Date: 02/02/2024 INDICATION: 58 year old male with a history of gastric cancer with recurrent malignant ascites. EXAM: ULTRASOUND GUIDED RIGHT PARACENTESIS MEDICATIONS: 8 mL 1% Lidocaine COMPLICATIONS: None immediate. PROCEDURE: Informed written consent was obtained from the patient after a discussion of the risks, benefits and alternatives to treatment. A timeout was performed prior to the initiation of the procedure. Initial ultrasound scanning demonstrates a large amount of ascites within the right lower abdominal quadrant. The right lower abdomen was prepped and draped in the usual sterile fashion. 1% lidocaine was used for local anesthesia. Following this, a 19 gauge, 7-cm, Yueh catheter was introduced. An ultrasound image was saved for documentation purposes. The paracentesis was performed. The catheter was removed and a dressing was applied. The patient tolerated the procedure well without immediate  post procedural complication. FINDINGS: A total of approximately 4.4 L of clear yellow fluid was removed. Samples were sent to the laboratory as requested by the clinical team. IMPRESSION: Successful ultrasound-guided paracentesis yielding 4.4 liters of peritoneal fluid. PLAN: If the patient eventually requires >/=2 paracenteses in a 30 day period, candidacy for formal evaluation by  the [facility] will be assessed. Performed By Theresa Mulligan, PA-C Electronically Signed   By: Corlis Leak M.D.   On: 02/02/2024 10:55   CT ABDOMEN PELVIS WO CONTRAST Result Date: 02/01/2024 CLINICAL DATA:  Abdominal pain, acute, nonlocalized. History of gastric cancer, metastatic. * Tracking Code: BO * EXAM: CT ABDOMEN AND PELVIS WITHOUT CONTRAST TECHNIQUE: Multidetector CT imaging of the abdomen and pelvis was performed following the standard protocol without IV contrast. RADIATION DOSE REDUCTION: This exam was performed according to the departmental dose-optimization program which includes automated exposure control, adjustment of the mA and/or kV according to patient size and/or use of iterative reconstruction technique. COMPARISON:  January 07, 2024. FINDINGS: Evaluation is limited by lack of IV contrast. Lower chest: No acute abnormality. Similar appearance of bibasilar interstitial lung disease. Hepatobiliary: Gallbladder is distended. No extrahepatic biliary ductal dilation. No new focal hepatic abnormality. Pancreas: No new peripancreatic fat stranding. Spleen: Unremarkable. Adrenals/Urinary Tract: Adrenal glands are unremarkable. No hydronephrosis. Nonobstructing bilateral nephrolithiasis. Bladder is unremarkable. Stomach/Bowel: No evidence of bowel obstruction. Similar appearance of gastric wall thickening consistent with known malignancy. Vascular/Lymphatic: No aneurysmal dilation of the abdominal aorta. IVC filter. Similar shotty lymphadenopathy throughout the upper abdomen. Representative porta hepatic lymph node measures 8 mm in the short axis, previously 5 mm (series 2, image 24). Infiltrative soft tissue throughout the upper abdomen consistent with peritoneal carcinomatosis. Reproductive: Prostate is unremarkable. Other: Moderate volume ascites. Revisualization of interdigitating soft tissue throughout the omentum, favored overall increased  since prior PET CT. This is consistent with biopsy-proven peritoneal carcinomatosis. Fat containing inguinal hernias. Musculoskeletal: No acute or significant osseous findings. IMPRESSION: 1. No evidence of bowel obstruction. 2. Similar appearance of gastric wall thickening consistent with known malignancy. 3. Moderate volume ascites with peritoneal carcinomatosis, favored overall increased since prior PET CT. 4. Nonobstructing bilateral nephrolithiasis. Electronically Signed   By: Meda Klinefelter M.D.   On: 02/01/2024 14:02   US Paracentesis Result Date: 01/26/2024 INDICATION: Patient with history of gastric cancer with recurrent malignant ascites. Request received for diagnostic and therapeutic paracentesis up to 4 liters. EXAM: ULTRASOUND GUIDED DIAGNOSTIC AND THERAPEUTIC PARACENTESIS MEDICATIONS: 8 mL 1% lidocaine COMPLICATIONS: None immediate. PROCEDURE: Informed written consent was obtained from the patient after a discussion of the risks, benefits and alternatives to treatment. A timeout was performed prior to the initiation of the procedure. Initial ultrasound scanning demonstrates a moderate amount of ascites within the right lower abdominal quadrant. The right lower abdomen was prepped and draped in the usual sterile fashion. 1% lidocaine was used for local anesthesia. Following this, a 19 gauge, 7-cm, Yueh catheter was introduced. An ultrasound image was saved for documentation purposes. The paracentesis was performed. The catheter was removed and a dressing was applied. The patient tolerated the procedure well without immediate post procedural complication. FINDINGS: A total of approximately 3.1 liters of slightly hazy, yellow fluid was removed. Samples were sent to the laboratory as requested by the clinical team. IMPRESSION: Successful ultrasound-guided paracentesis yielding 3.1 liters of peritoneal fluid. Performed by: Artemio Aly Electronically Signed   By: Corlis Leak M.D.   On:  01/26/2024 12:43   IR Paracentesis  Result Date: 01/22/2024 INDICATION: 59 year old with gastric cancer and evidence for peritoneal disease. Patient has developed a large volume of ascites EXAM: ULTRASOUND GUIDED DIAGNOSTIC AND THERAPEUTIC PARACENTESIS MEDICATIONS: None. COMPLICATIONS: None immediate. PROCEDURE: Informed written consent was obtained from the patient after a discussion of the risks, benefits and alternatives to treatment. A timeout was performed prior to the initiation of the procedure. Initial ultrasound scanning demonstrates a large amount of ascites within the right upper abdominal quadrant. The right lower abdomen was prepped and draped in the usual sterile fashion. 1% lidocaine was used for local anesthesia. Following this, a 6 Fr Safe-T-Centesis catheter was introduced. An ultrasound image was saved for documentation purposes. The paracentesis was performed. The catheter was removed and a dressing was applied. The patient tolerated the procedure well without immediate post procedural complication. FINDINGS: Largest pocket of fluid was in the right upper abdomen around the liver. A total of approximately 2.9 L of yellow fluid was removed. Samples were sent to the laboratory as requested by the clinical team. IMPRESSION: Successful ultrasound-guided paracentesis yielding 2.9 liters of peritoneal fluid. Electronically Signed   By: Richarda Overlie M.D.   On: 01/22/2024 10:46   IR IMAGING GUIDED PORT INSERTION Result Date: 01/22/2024 INDICATION: 58 year old with gastric cancer. Patient needs durable venous access for treatment. EXAM: FLUOROSCOPIC AND ULTRASOUND GUIDED PLACEMENT OF A SUBCUTANEOUS PORT COMPARISON:  None Available. MEDICATIONS: Moderate sedation ANESTHESIA/SEDATION: Moderate (conscious) sedation was employed during this procedure. A total of Versed 1 mg and fentanyl 75 mcg was administered intravenously at the order of the provider performing the procedure. Total intra-service moderate  sedation time: 26 minutes. Patient's level of consciousness and vital signs were monitored continuously by radiology nurse throughout the procedure under the supervision of the provider performing the procedure. FLUOROSCOPY TIME:  Radiation Exposure Index (as provided by the fluoroscopic device): 4 mGy Kerma COMPLICATIONS: None immediate. PROCEDURE: The procedure, risks, benefits, and alternatives were explained to the patient. Questions regarding the procedure were encouraged and answered. The patient understands and consents to the procedure. Patient was placed supine on the interventional table. Ultrasound confirmed a patent right internal jugular vein. Ultrasound image was saved for documentation. The right chest and neck were cleaned with a skin antiseptic and a sterile drape was placed. Maximal barrier sterile technique was utilized including caps, mask, sterile gowns, sterile gloves, sterile drape, hand hygiene and skin antiseptic. The right neck was anesthetized with 1% lidocaine. Small incision was made in the right neck with a blade. Micropuncture set was placed in the right internal jugular vein with ultrasound guidance. The micropuncture wire was used for measurement purposes. The right chest was anesthetized with 1% lidocaine with epinephrine. #15 blade was used to make an incision and a subcutaneous port pocket was formed. 8 french Power Port was assembled. Subcutaneous tunnel was formed with a stiff tunneling device. The port catheter was brought through the subcutaneous tunnel. The port was placed in the subcutaneous pocket. The micropuncture set was exchanged for a peel-away sheath. The catheter was placed through the peel-away sheath and the tip was positioned at the superior cavoatrial junction. Catheter placement was confirmed with fluoroscopy. The port was accessed and flushed with heparinized saline. The port pocket was closed using two layers of absorbable sutures and Dermabond. The vein skin  site was closed using a single layer of absorbable suture and Dermabond. Sterile dressings were applied. Patient tolerated the procedure well without an immediate complication. Ultrasound and fluoroscopic images were taken and saved for this  procedure. IMPRESSION: Placement of a subcutaneous power-injectable port device. Catheter tip at the superior cavoatrial junction. Electronically Signed   By: Richarda Overlie M.D.   On: 01/22/2024 10:43   CT ABDOMINAL MASS BIOPSY Result Date: 01/22/2024 INDICATION: 58 year old with gastric cancer. Request for peritoneal biopsy to stage the disease. EXAM: CT-GUIDED BIOPSY OF OMENTAL/PERITONEAL THICKENING TECHNIQUE: Multidetector CT imaging of the abdomen was performed following the standard protocol with/without IV contrast. RADIATION DOSE REDUCTION: This exam was performed according to the departmental dose-optimization program which includes automated exposure control, adjustment of the mA and/or kV according to patient size and/or use of iterative reconstruction technique. MEDICATIONS: Moderate sedation ANESTHESIA/SEDATION: Moderate (conscious) sedation was employed during this procedure. A total of Versed 1 mg and Fentanyl 75 mcg was administered intravenously by the radiology nurse. Total intra-service moderate Sedation Time: 15 minutes. The patient's level of consciousness and vital signs were monitored continuously by radiology nursing throughout the procedure under my direct supervision. COMPLICATIONS: None immediate. PROCEDURE: Informed written consent was obtained from the patient after a thorough discussion of the procedural risks, benefits and alternatives. All questions were addressed. A timeout was performed prior to the initiation of the procedure. CT images through the abdomen were obtained. Extensive omental/peritoneal thickening in the anterior abdomen was identified and targeted for biopsy. The left anterior abdomen was prepped with chlorhexidine and sterile field  was created. Skin was anesthetized with 1% lidocaine. Small incision was made. Using CT guidance, a 17 gauge coaxial needle was directed into the left anterior peritoneal thickening. Core biopsies were obtained with an 18 gauge device. Specimens placed in formalin. Needle was removed without complication. Bandage placed over the puncture site. FINDINGS: CT images of the abdomen demonstrate progression of peritoneal disease since 01/07/2024. The patient has now developed increased ascites particularly in the perihepatic region. In addition, there is markedly increased omental/peritoneal thickening in the anterior abdomen. Biopsy needle was directed into the left anterior abdominal peritoneal disease. No immediate bleeding or hematoma formation. IMPRESSION: 1. CT-guided biopsy of omental/peritoneal thickening. 2. Progression of peritoneal disease demonstrated by increased peritoneal thickening and ascites. Electronically Signed   By: Richarda Overlie M.D.   On: 01/22/2024 10:42   US THYROID Result Date: 01/15/2024 CLINICAL DATA:  Incidental on PET. Increased metabolic activity by PET throughout much of the right lobe and possibly extending posterior to the right lobe. EXAM: THYROID ULTRASOUND TECHNIQUE: Ultrasound examination of the thyroid gland and adjacent soft tissues was performed. COMPARISON:  PET scan on 01/07/2024 FINDINGS: Parenchymal Echotexture: Normal Isthmus: 0.7 cm Right lobe: 5.1 x 1.8 x 2.4 cm Left lobe: 4.0 x 1.4 x 1.2 cm _________________________________________________________ Estimated total number of nodules >/= 1 cm: 1 Number of spongiform nodules >/=  2 cm not described below (TR1): 0 Number of mixed cystic and solid nodules >/= 1.5 cm not described below (TR2): 0 _________________________________________________________ Nodule # 1: Location: Isthmus Maximum size: 1.1 cm; Other 2 dimensions: 1.1 x 0.7 cm Composition: solid/almost completely solid (2) Echogenicity: hypoechoic (2) Shape: not  taller-than-wide (0) Margins: smooth (0) Echogenic foci: none (0) ACR TI-RADS total points: 4. ACR TI-RADS risk category: TR4 (4-6 points). ACR TI-RADS recommendations: *Given size (>/= 1 - 1.4 cm) and appearance, a follow-up ultrasound in 1 year should be considered based on TI-RADS criteria. _________________________________________________________ No abnormal nodule is identified in the right lobe. There is no identifiable abnormal parathyroid soft tissue nodules. No enlarged or abnormal appearing lymph nodes are identified. IMPRESSION: 1. 1.1 cm thyroid isthmus nodule meets criteria  for 1 year follow-up ultrasound. 2. No sonographic correlate is identified for the increased metabolic activity within the right lobe or potentially extending posterior to the right lobe. There is no evidence of an obvious parathyroid adenoma by ultrasound. The above is in keeping with the ACR TI-RADS recommendations - J Am Coll Radiol 2017;14:587-595. Electronically Signed   By: Irish Lack M.D.   On: 01/15/2024 13:36   NM PET Image Initial (PI) Skull Base To Thigh (F-18 FDG) Result Date: 01/08/2024 CLINICAL DATA:  Malignant gastric tumor of the stomach. Initial treatment strategy. EXAM: NUCLEAR MEDICINE PET SKULL BASE TO THIGH TECHNIQUE: 10.8 mCi F-18 FDG was injected intravenously. Full-ring PET imaging was performed from the skull base to thigh after the radiotracer. CT data was obtained and used for attenuation correction and anatomic localization. Fasting blood glucose: 113 mg/dl COMPARISON:  Outside CT 11/28/2023 FINDINGS: NECK: Intense hypermetabolic activity localizes to the RIGHT lobe of thyroid gland with SUV max equal 7.7 on image 52. There is posterior nodular extension of the RIGHT lobe of the gland with a 15 mm nodule adjacent to the esophagus with equal intensity radiotracer activity Incidental CT findings: None. CHEST: Moderate radiotracer activity associated small RIGHT axillary lymph node with max SUV max  equal 4.0 on image 77. No paraesophageal metabolic adenopathy. No suspicious pulmonary nodules. Incidental CT findings: Mild chronic interstitial lung disease noted. ABDOMEN/PELVIS: There circumferential gastric mucosal thickening involving the gastric body and antrum. This circumferential thickening is diffusely hypermetabolic SUV max equal 7.0 on image 101 for example. There is ill-defined hypermetabolic lymph nodes in the gastrohepatic ligament with SUV max equal 6.8 on image 105. No discrete measurable nodes. There is haziness within the gastrohepatic ligament peritoneal fat (image 105/4). No abnormal metabolic activity in the liver. Hypermetabolic abdominopelvic lymph nodes. Incidental CT findings: . Uterus and adnexa unremarkable. Infrarenal IVC filter in place. SKELETON: No focal hypermetabolic activity to suggest skeletal metastasis. Incidental CT findings: None. IMPRESSION: 1. Circumferential hypermetabolic gastric mucosal thickening consistent with primary gastric adenocarcinoma. 2. Ill-defined hypermetabolic lymph nodes in the gastrohepatic ligament concerning for with local nodal metastasis. 3. No evidence of liver metastasis. 4. Intense hypermetabolic activity associated with the RIGHT lobe of thyroid gland. Favor primary thyroid neoplasm. Recommend ultrasound of the thyroid gland and fine needle aspiration. 5. Small hypermetabolic RIGHT axillary lymph node is favored reactive. Electronically Signed   By: Genevive Bi M.D.   On: 01/08/2024 16:20   Mr Catlin was examined.  His wife and daughter at the bedside when I saw him this morning.  He appeared very confused and agitated.  He was not following commands.  He is now at day 4 allowing cycle 1 FOLFOX.  There is possible pneumonia on the admission chest x-ray, but he does not have symptoms of pneumonia and he is not hypoxic.  I doubt the altered mental status is related to toxicity from chemotherapy.  He does not have a history of significant  alcohol use.  There is no MRI evidence of metastatic disease involving the CNS.  It is possible he has delirium secondary to critical illness, but his clinical presentation appears extreme for this.  Recommendations: Continue broad-spectrum intravenous antibiotics, follow-up cultures Consider noncontrast chest CT to evaluate for possible pneumonia Continue intravenous hydration, supportive care Consider neurology evaluation for persistent altered mental status I will check on him again this afternoon, please call oncology as needed over the weekend.  I will see him 02/09/2024

## 2024-02-06 NOTE — Evaluation (Signed)
Clinical/Bedside Swallow Evaluation Patient Details  Name: Henry May MRN: [id number] Date of Birth: 1966-11-14  Today's Date: 02/06/2024 Time: SLP Start Time (ACUTE ONLY): 0900 SLP Stop Time (ACUTE ONLY): 0935 SLP Time Calculation (min) (ACUTE ONLY): 35 min  Past Medical History:  Past Medical History:  Diagnosis Date   Acute respiratory failure (HCC) 08/06/2020   Chronic cough    SINCE 08-06-2020 COVID PNEUMONIA   COVID 08/06/2020   Dvt femoral (deep venous thrombosis) (HCC) 08/2020   LEFT LEG    History of blood transfusion 08/2020   AFTER MI 5 UNITS GIVEN   History of kidney stones    Hypertension    Numbness    LEFT LEG AT TIMES   Pneumonia 08/06/2020   COVID PNEUMONIA   Past Surgical History:  Past Surgical History:  Procedure Laterality Date   CYSTOSCOPY WITH STENT PLACEMENT Left 10/06/2020   Procedure: CYSTOSCOPY WITH STENT PLACEMENT;  Surgeon: Rene Paci, MD;  Location: [facility];  Service: Urology;  Laterality: Left;   CYSTOSCOPY/URETEROSCOPY/HOLMIUM LASER/STENT PLACEMENT Left 11/14/2020   Procedure: CYSTOSCOPY/URETEROSCOPY/HOLMIUM LASER/STENT PLACEMENT;  Surgeon: Rene Paci, MD;  Location: [facility];  Service: Urology;  Laterality: Left;  ONLY NEEDS 45 MIN   IR IMAGING GUIDED PORT INSERTION  01/22/2024   IR IVC FILTER PLMT / S&I /IMG GUID/MOD SED  08/24/2020   IR PARACENTESIS  01/22/2024   HPI:  58yo male admitted 02/05/24 with AMS following first chemo - N/V. 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 CXR = patchy perihilar airspace disease pulm edema vs multifocal PNA.    Assessment / Plan / Recommendation  Clinical Impression  Pt presents with dysphagia, likely largely due to history of esophageal issues and gastric cancer.   Oropharyngeal swallow appears WFL, as best can be determined without instrumental study. Pt presents with full dentures, Oral cavity noted to be very  dry. CN exam appears unremarkable, although pt was unable to follow commands consistently. Pt wife was present during bedside assessment, and reports pt is unable to tolerate milk products, and vomits solid textures at this time. Oral care was completed with suction prior to PO presentation.   Pt tolerated trials of ice chips, thin liquid via straw, and several boluses of applesauce via teaspoon. Timely oral prep and clearing, adequate laryngeal elevation per palpation, and no overt s/s aspiration observed across textures. Pt's issue with dysphagia appears to be related to history of GERD and recently diagnosed gastic cancer.   Pt appears safe for thin liquids and applesauce. Wife reports he tolerates broth based soups, but vomits milk products. Appreciate input from RD re: appropriate diet recommendation given these factors. Medical team informed of results and recommendations. ST signing off. Please reconsult if needs arise.  SLP Visit Diagnosis: Dysphagia, unspecified (R13.10)    Aspiration Risk  Mild aspiration risk    Diet Recommendation Other (Comment) (full liquid, NO MILK PRODUCTS)    Liquid Administration via: Cup;Straw Medication Administration: Other (Comment) (as tolerated) Supervision: Staff to assist with self feeding;Full supervision/cueing for compensatory strategies Compensations: Minimize environmental distractions;Slow rate;Small sips/bites Postural Changes: Seated upright at 90 degrees;Remain upright for at least 30 minutes after po intake    Other  Recommendations Oral Care Recommendations: Oral care BID    Recommendations for follow up therapy are one component of a multi-disciplinary discharge planning process, led by the attending physician.  Recommendations may be updated based on patient status, additional functional criteria and insurance  authorization.  Follow up Recommendations Follow physician's recommendations for discharge plan and follow up therapies       Functional Status Assessment Patient has had a recent decline in their functional status and demonstrates the ability to make significant improvements in function in a reasonable and predictable amount of time.      Prognosis Prognosis for improved oropharyngeal function: Fair      Swallow Study   General Date of Onset: 02/05/24 HPI: 58yo male admitted 02/05/24 with AMS following first chemo - N/V. 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 CXR = patchy perihilar airspace disease pulm edema vs multifocal PNA. Type of Study: Bedside Swallow Evaluation Previous Swallow Assessment: none Diet Prior to this Study: NPO Temperature Spikes Noted: No Respiratory Status: Room air History of Recent Intubation: No Behavior/Cognition: Alert;Confused;Requires cueing;Distractible;Doesn't follow directions Oral Cavity Assessment: Dry Oral Care Completed by SLP: Yes (suction set up at bedside) Oral Cavity - Dentition: Dentures, top;Dentures, bottom Vision: Functional for self-feeding Self-Feeding Abilities: Needs assist Patient Positioning: Upright in bed Baseline Vocal Quality: Normal Volitional Cough: Cognitively unable to elicit Volitional Swallow: Unable to elicit    Oral/Motor/Sensory Function Overall Oral Motor/Sensory Function: Within functional limits   Ice Chips Ice chips: Within functional limits Presentation: Spoon   Thin Liquid Thin Liquid: Within functional limits Presentation: Straw    Nectar Thick Nectar Thick Liquid: Not tested   Honey Thick Honey Thick Liquid: Not tested   Puree Puree: Within functional limits Presentation: Spoon   Solid     Solid: Not tested Other Comments: wife reports vomiting of solid textures, so these were deferred     Briannah Lona B. Murvin Natal, [facility], CCC-SLP Speech Language Pathologist  Leigh Aurora 02/06/2024,9:52 AM

## 2024-02-06 NOTE — Progress Notes (Signed)
PROGRESS NOTE    Henry May  UEA:[id number] DOB: 01/24/1966 DOA: 02/05/2024 PCP: Tiffany Kocher, DO   Brief Narrative:  Henry May is a 57 y.o. 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  Earlier this week he had his first chemotherapy of FOLFOX.  Reportedly since that time he has gotten progressively weaker with nausea, vomiting, and now presents with acute onset altered mental status 24 hours prior to admission.  Hospitalist for admission, oncology called in consult.  Assessment & Plan:   Principal Problem:   Acute kidney injury (AKI) with acute tubular necrosis (ATN) (HCC)   Acute metabolic encephalopathy versus toxic encephalopathy, minimally improving  -Unclear etiology, concern for polypharmacy versus infectious process -Home tramadol count concurrent with recent prescription filled, he has not filled benzodiazepine prescription -Treating with broad-spectrum antibiotics to cover pneumonia versus UTI given positive procalcitonin but no clear symptomatology, patient's review of systems is limited -wife denies any recent complaints -MRI and intake negative for acute process -Rule out delirium -questionable sleep cycle difficulties at home  Intractable nausea vomiting  -Likely in the setting of recent chemotherapy versus known gastric cancer and carcinomatosis -Improving with supportive care -Currently on liquid diet, will advance as tolerated once mental status improves  Questionable AKI versus new baseline CKD 2 Hyponatremia, hypovolemic Hyperkalemia, mild, resolved -Previous creatinine within normal limits, over the past 3 weeks patient's creatinine and GFR have been somewhat diminished with minimally elevated BUN(although likely not elevated enough to cause mental status changes as above) -Continue IV fluids, p.o. liquids as  tolerated, follow repeat labs -Lab abnormalities in the setting of poor p.o. intake  HF, not in acute exacerbation -Patient appears hypovolemic, elevated BNP is not useful as a single lab  Dysphagia -Questionably new onset versus related to mental status changes as above, speech following, appreciate insight and recommendations  Questionable pneumonia, POA -Chest x-ray with questionable bilateral opacities concerning for possible pneumonia versus pulmonary edema or aspiration -Continue broad-spectrum antibiotics, no recent illnesses or sick contacts per wife although procalcitonin is elevated with no other obvious source of infection   Generalized weakness PT OT evaluation pending, likely secondary to above Fall precautions in place given high fall risk   Recently diagnosed 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 Recently dosed with first chemotherapy of FOLFOX. Consult medical oncology in the morning.  DVT prophylaxis: enoxaparin (LOVENOX) injection 40 mg Start: 02/06/24 1000 Code Status:   Code Status: Full Code Family Communication: Wife bedside  Status is: Inpatient  Dispo: The patient is from: Home              Anticipated d/c is to: To be determined              Anticipated d/c date is: To be determined              Patient currently not medically stable for discharge  Consultants:  Oncology  Procedures:  None  Antimicrobials:  Azithromycin, ceftriaxone  Subjective: No acute issues or events overnight, mental status appears to be improving somewhat, patient able to interact more meaningfully today per wife compared yesterday where he was essentially nonreactive.  Otherwise review of systems is markedly limited by patient's mental status.  Objective: Vitals:   02/05/24 2000 02/05/24 2123 02/06/24 0200 02/06/24 0532  BP: 109/80 109/85 113/88 108/78  Pulse:  92 95 86  Resp: (!) 27 18 20  16  Temp:  98.2 F (36.8 C) 97.9 F (36.6  C) 97.9 F (36.6 C)  TempSrc:  Axillary Oral Oral  SpO2:  95% 95% 95%  Weight:      Height:        Intake/Output Summary (Last 24 hours) at 02/06/2024 0700 Last data filed at 02/06/2024 0600 Gross per 24 hour  Intake 511.73 ml  Output 500 ml  Net 11.73 ml   Filed Weights   02/05/24 1253  Weight: 89.6 kg    Examination:  General:  Pleasantly resting in bed, No acute distress. HEENT:  Normocephalic atraumatic.  Sclerae nonicteric, noninjected.  Extraocular movements intact bilaterally. Neck:  Without mass or deformity.  Trachea is midline. Lungs:  Clear to auscultate bilaterally without rhonchi, wheeze, or rales. Heart:  Regular rate and rhythm.  Without murmurs, rubs, or gallops. Abdomen: Soft, questionably tender, nondistended without guarding. Extremities: Without cyanosis, clubbing, edema, or obvious deformity. Neuro: Awake alert, orientation difficult to assess given poorly verbal status.  Unable to follow verbal commands but does copy physical cues and interacting appropriate ways when asked to shake hand or copy movements. Skin:  Warm and dry, no erythema.  Data Reviewed: I have personally reviewed following labs and imaging studies  CBC: Recent Labs  Lab 02/01/24 0431 02/02/24 0615 02/03/24 0825 02/05/24 1514  WBC 13.2* 12.0* 16.1* 9.9  NEUTROABS  --   --  14.2* 8.9*  HGB 18.1* 16.6 16.8 16.5  HCT 53.0* 51.3 50.2 49.5  MCV 90.0 93.1 89.2 89.4  PLT 319 326 317 303   Basic Metabolic Panel: Recent Labs  Lab 02/01/24 0431 02/02/24 0615 02/02/24 1615 02/03/24 0825 02/05/24 1208 02/05/24 1514  NA 131* 131* 131* 129* 132* 129*  K 4.8 4.8 4.8 4.9 4.7 5.3*  CL 98 98 99 96* 99 93*  CO2 19* 21* 24 24 21* 24  GLUCOSE 119* 121* 127* 148* 155* 147*  BUN 41* 37* 33* 33* 56* 57*  CREATININE 1.44* 1.46* 1.12 1.31* 1.41* 1.59*  CALCIUM 8.7* 8.5* 8.1* 8.8* 7.4* 8.5*  MG 2.6*  --   --   --   --  2.7*   GFR: Estimated Creatinine Clearance: 54.9 mL/min (A) (by C-G  formula based on SCr of 1.59 mg/dL (H)).  Home Liver Function Tests: Recent Labs  Lab 02/01/24 0431 02/03/24 0825 02/05/24 1208 02/05/24 1514  AST 33 22 57* 66*  ALT 37 20 65* 75*  ALKPHOS 85 84 71 84  BILITOT 0.4 0.5 0.4 0.5  PROT 7.3 6.7 5.5* 6.4*  ALBUMIN 3.0* 3.3* 2.9* 3.4*   Recent Labs  Lab 02/01/24 0431 02/05/24 1514  LIPASE 106* 39   Recent Labs  Lab 02/05/24 1514  AMMONIA 39*   CBG: Recent Labs  Lab 02/05/24 2302  GLUCAP 146*   Sepsis Labs: Recent Labs  Lab 02/05/24 1934 02/05/24 2138  LATICACIDVEN 1.4 1.7    Recent Results (from the past 240 hours)  Resp panel by RT-PCR (RSV, Flu A&B, Covid) Peripheral     Status: None   Collection Time: 02/05/24  7:34 PM   Specimen: Peripheral; Nasal Swab  Result Value Ref Range Status   SARS Coronavirus 2 by RT PCR NEGATIVE NEGATIVE Final    Comment: (NOTE) SARS-CoV-2 target nucleic acids are NOT DETECTED.  The SARS-CoV-2 RNA is generally detectable in upper respiratory specimens during the acute phase of infection. The lowest concentration of SARS-CoV-2 viral copies this assay can detect is 138 copies/mL. A negative result does not preclude SARS-Cov-2  infection and should not be used as the sole basis for treatment or other patient management decisions. A negative result may occur with  improper specimen collection/handling, submission of specimen other than nasopharyngeal swab, presence of viral mutation(s) within the areas targeted by this assay, and inadequate number of viral copies(<138 copies/mL). A negative result must be combined with clinical observations, patient history, and epidemiological information. The expected result is Negative.  Fact Sheet for Patients:  BloggerCourse.com  Fact Sheet for Healthcare Providers:  SeriousBroker.it  This test is no t yet approved or cleared by the [place] FDA and  has been authorized for detection  and/or diagnosis of SARS-CoV-2 by FDA under an Emergency Use Authorization (EUA). This EUA will remain  in effect (meaning this test can be used) for the duration of the COVID-19 declaration under Section 564(b)(1) of the Act, 21 U.S.C.section 360bbb-3(b)(1), unless the authorization is terminated  or revoked sooner.       Influenza A by PCR NEGATIVE NEGATIVE Final   Influenza B by PCR NEGATIVE NEGATIVE Final    Comment: (NOTE) The Xpert Xpress SARS-CoV-2/FLU/RSV plus assay is intended as an aid in the diagnosis of influenza from Nasopharyngeal swab specimens and should not be used as a sole basis for treatment. Nasal washings and aspirates are unacceptable for Xpert Xpress SARS-CoV-2/FLU/RSV testing.  Fact Sheet for Patients: BloggerCourse.com  Fact Sheet for Healthcare Providers: SeriousBroker.it  This test is not yet approved or cleared by the [place] FDA and has been authorized for detection and/or diagnosis of SARS-CoV-2 by FDA under an Emergency Use Authorization (EUA). This EUA will remain in effect (meaning this test can be used) for the duration of the COVID-19 declaration under Section 564(b)(1) of the Act, 21 U.S.C. section 360bbb-3(b)(1), unless the authorization is terminated or revoked.     Resp Syncytial Virus by PCR NEGATIVE NEGATIVE Final    Comment: (NOTE) Fact Sheet for Patients: BloggerCourse.com  Fact Sheet for Healthcare Providers: SeriousBroker.it  This test is not yet approved or cleared by the [place] FDA and has been authorized for detection and/or diagnosis of SARS-CoV-2 by FDA under an Emergency Use Authorization (EUA). This EUA will remain in effect (meaning this test can be used) for the duration of the COVID-19 declaration under Section 564(b)(1) of the Act, 21 U.S.C. section 360bbb-3(b)(1), unless the authorization is terminated  or revoked.  Performed at [facility], [address], [place], [place] [address]          Radiology Studies: MR Brain W and Wo Contrast Result Date: 02/05/2024 CLINICAL DATA:  Provided history: Mental status change, unknown cause. Additional history provided: History of gastric cancer. EXAM: MRI HEAD WITHOUT AND WITH CONTRAST TECHNIQUE: Multiplanar, multiecho pulse sequences of the brain and surrounding structures were obtained without and with intravenous contrast. CONTRAST:  9mL GADAVIST GADOBUTROL 1 MMOL/ML IV SOLN COMPARISON:  None. FINDINGS: Intermittently motion degraded examination. Most notably, the axial T1-weighted post-contrast sequence is mild-to-moderately motion degraded. Within this limitation, findings are as follows. Brain: Cerebral volume is normal. No cortical encephalomalacia is identified. No significant cerebral white matter disease for age. There is no acute infarct. No evidence of an intracranial mass. No chronic intracranial blood products. No extra-axial fluid collection. No midline shift. No pathologic intracranial enhancement identified. Vascular: Maintained flow voids within the proximal large arterial vessels. 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 Skull and upper cervical spine: No focal worrisome marrow lesion. Sinuses/Orbits: No mass or acute finding within  the imaged orbits. 10 mm mucous retention cyst within the left sphenoid sinus. Minimal mucosal thickening within right ethmoid air cells. IMPRESSION: 1. Intermittently motion degraded examination (most notably, there is mild-to-moderate motion degradation of the axial T1-weighted post-contrast sequence). 2. Within this limitation, no evidence of intracranial metastatic disease or an acute intracranial abnormality. 3. 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 4. Otherwise unremarkable MRI appearance of the  brain for age. 5. Mild paranasal sinus disease as described. Electronically Signed   By: Jackey Loge D.O.   On: 02/05/2024 18:56   DG Chest Portable 1 View Result Date: 02/05/2024 CLINICAL DATA:  Weakness. EXAM: PORTABLE CHEST 1 VIEW COMPARISON:  08/22/2020 FINDINGS: Port in the anterior chest wall with tip in distal SVC. Normal cardiac silhouette. Low lung volumes. There is patchy perihilar airspace disease. No pneumothorax. No focal consolidation. IMPRESSION: Patchy perihilar airspace disease suggests pulmonary edema versus multifocal pneumonia. Electronically Signed   By: Genevive Bi M.D.   On: 02/05/2024 15:50   Scheduled Meds:  Chlorhexidine Gluconate Cloth  6 each Topical Daily   enoxaparin (LOVENOX) injection  40 mg Subcutaneous Q24H   sodium chloride flush  10-40 mL Intracatheter Q12H   Continuous Infusions:  azithromycin     cefTRIAXone (ROCEPHIN)  IV       LOS: 1 day   Time spent:  Azucena Fallen, DO [facility]  If 7PM-7AM, please contact night-coverage [email or web address]  02/06/2024, 7:00 AM

## 2024-02-06 NOTE — Progress Notes (Signed)
   02/06/24 0914  TOC Brief Assessment  Insurance and Status Reviewed  Patient has primary care physician No  Home environment has been reviewed Resides in single family home with spouse  Prior level of function: Independent at baseline with ADLs  Prior/Current Home Services No current home services  Social Drivers of Health Review SDOH reviewed no interventions necessary  Readmission risk has been reviewed Yes  Transition of care needs no transition of care needs at this time

## 2024-02-06 NOTE — Progress Notes (Signed)
   02/06/24 1329  Vitals  Temp 97.7 F (36.5 C)  BP 97/64  MAP (mmHg) 74  BP Location Left Arm  BP Method Automatic  Patient Position (if appropriate) Lying  Pulse Rate 79  Resp 18  MEWS COLOR  MEWS Score Color Green  Oxygen Therapy  SpO2 98 %    Patient is clammy and flushed. Blood pressure 97/64. Patient's wife voiced concerns about patient potentially going into "respiratory arrest". MD notified.

## 2024-02-07 DIAGNOSIS — N170 Acute kidney failure with tubular necrosis: Secondary | ICD-10-CM | POA: Diagnosis not present

## 2024-02-07 LAB — BASIC METABOLIC PANEL
Anion gap: 12 (ref 5–15)
BUN: 38 mg/dL — ABNORMAL HIGH (ref 6–20)
CO2: 25 mmol/L (ref 22–32)
Calcium: 7.7 mg/dL — ABNORMAL LOW (ref 8.9–10.3)
Chloride: 97 mmol/L — ABNORMAL LOW (ref 98–111)
Creatinine, Ser: 1.13 mg/dL (ref 0.61–1.24)
GFR, Estimated: >60 mL/min (ref >60)
Glucose, Bld: 108 mg/dL — ABNORMAL HIGH (ref 70–99)
Potassium: 3.6 mmol/L (ref 3.5–5.1)
Sodium: 134 mmol/L — ABNORMAL LOW (ref 135–145)

## 2024-02-07 LAB — CBC
HCT: 38.4 % — ABNORMAL LOW (ref 39.0–52.0)
Hemoglobin: 12.8 g/dL — ABNORMAL LOW (ref 13.0–17.0)
MCH: 30.3 pg (ref 26.0–34.0)
MCHC: 33.3 g/dL (ref 30.0–36.0)
MCV: 91 fL (ref 80.0–100.0)
Platelets: 218 10*3/uL (ref 150–400)
RBC: 4.22 MIL/uL (ref 4.22–5.81)
RDW: 12.4 % (ref 11.5–15.5)
WBC: 7.1 10*3/uL (ref 4.0–10.5)
nRBC: 0 % (ref 0.0–0.2)

## 2024-02-07 MED ORDER — LORAZEPAM 2 MG/ML IJ SOLN
0.5000 mg | Freq: Once | INTRAMUSCULAR | Status: AC
  Administered 2024-02-07: 0.5 mg via INTRAVENOUS
  Filled 2024-02-07: qty 1

## 2024-02-07 MED ORDER — SODIUM CHLORIDE 0.9 % IV SOLN
100.0000 mg | Freq: Two times a day (BID) | INTRAVENOUS | Status: DC
  Administered 2024-02-07 – 2024-02-09 (×5): 100 mg via INTRAVENOUS
  Filled 2024-02-07 (×6): qty 100

## 2024-02-07 NOTE — Plan of Care (Signed)
  Problem: Clinical Measurements: Goal: Will remain free from infection Outcome: Progressing   Problem: Activity: Goal: Risk for activity intolerance will decrease Outcome: Progressing   Problem: Safety: Goal: Ability to remain free from injury will improve Outcome: Progressing   

## 2024-02-07 NOTE — Plan of Care (Signed)
   Problem: Coping: Goal: Level of anxiety will decrease Outcome: Progressing   Problem: Safety: Goal: Ability to remain free from injury will improve Outcome: Progressing   Problem: Pain Managment: Goal: General experience of comfort will improve and/or be controlled Outcome: Progressing

## 2024-02-07 NOTE — Progress Notes (Addendum)
 PROGRESS NOTE    Henry May  MVH:[id number] DOB: July 01, 1966 DOA: 02/05/2024 PCP: Tiffany Kocher, DO   Brief Narrative:  Henry May is a 58 y.o. 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  Earlier this week he had his first chemotherapy of FOLFOX.  Reportedly since that time he has gotten progressively weaker with nausea, vomiting, and now presents with acute onset altered mental status 24 hours prior to admission.  Hospitalist for admission, oncology called in consult.  Assessment & Plan:   Principal Problem:   Acute kidney injury (AKI) with acute tubular necrosis (ATN) (HCC)   Acute metabolic encephalopathy versus toxic encephalopathy, minimally improving  -Unclear etiology, concern for polypharmacy versus infectious process -Home tramadol count concurrent with recent prescription filled, he has not filled/initiated benzodiazepine prescription per family -Treating with broad-spectrum antibiotics to cover pneumonia versus UTI given positive procalcitonin but no clear symptomatology, patient's review of systems is limited -wife denies any recent complaints -MRI and intake negative for acute process -Rule out delirium -questionable sleep cycle difficulties at home -Ativan overnight for agitation, hold further sedatives as family is at bedside to redirect patient -Patient continues to become agitated, he has now pulled out his port twice, will attempt peripheral IV and deaccessed the port, will likely end up requiring restraints and one-to-one sitter this afternoon as family is unable to redirect or control the patient.  Intractable nausea vomiting  -Likely in the setting of recent chemotherapy versus known gastric cancer and carcinomatosis -Improving with supportive care -Currently on liquid diet, will advance as tolerated once mental status  improves  Questionable AKI versus new baseline CKD 2 Hyponatremia, hypovolemic Hyperkalemia, mild, resolved -Previous creatinine within normal limits, over the past 3 weeks patient's creatinine and GFR have been somewhat diminished with minimally elevated BUN(although likely not elevated enough to cause mental status changes as above) -Continue IV fluids, p.o. liquids as tolerated, follow repeat labs -Lab abnormalities in the setting of poor p.o. intake  HF, not in acute exacerbation -Patient appears hypovolemic, elevated BNP is not useful as a single lab  Dysphagia -Questionably new onset versus related to mental status changes as above, speech following, appreciate insight and recommendations  Questionable pneumonia, POA -Chest x-ray with questionable bilateral opacities concerning for possible pneumonia versus pulmonary edema or aspiration -continue 5 days of antibiotics -Continue broad-spectrum antibiotics -(transition from azithromycin to doxycycline given QT prolongation), no recent illnesses or sick contacts per wife although procalcitonin is elevated with no other obvious source of infection   Generalized weakness PT OT evaluation pending, likely secondary to above Fall precautions in place given high fall risk   Recently diagnosed 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 Recently dosed with first chemotherapy of FOLFOX. Oncology following, appreciate insight recommendations  DVT prophylaxis: enoxaparin (LOVENOX) injection 40 mg Start: 02/06/24 1000 Code Status:   Code Status: Full Code Family Communication: Wife bedside  Status is: Inpatient  Dispo: The patient is from: Home              Anticipated d/c is to: To be determined              Anticipated d/c date is: To be determined              Patient currently not medically stable for discharge  Consultants:  Oncology  Procedures:  None  Antimicrobials:  Azithromycin,  ceftriaxone  Subjective: Mental  status generally improving, required Ativan overnight for agitation but much more interactive and following commands today speaking both [place] and Spanish although incoherently.  Review of systems limited family indicate patient appears comfortable.  Objective: Vitals:   02/06/24 1329 02/06/24 1606 02/06/24 1816 02/06/24 2127  BP: 97/64 105/78  102/63  Pulse: 79 83  83  Resp: 18 (!) 22    Temp: 97.7 F (36.5 C) 98 F (36.7 C) 98.4 F (36.9 C) 98.9 F (37.2 C)  TempSrc:  Oral Oral Oral  SpO2: 98% 100%  97%  Weight:      Height:        Intake/Output Summary (Last 24 hours) at 02/07/2024 0719 Last data filed at 02/06/2024 2200 Gross per 24 hour  Intake 680.87 ml  Output --  Net 680.87 ml   Filed Weights   02/05/24 1253 02/06/24 0500  Weight: 89.6 kg 91.6 kg    Examination:  General:  Pleasantly resting in bed, No acute distress. HEENT:  Normocephalic atraumatic.  Sclerae nonicteric, noninjected.  Extraocular movements intact bilaterally. Neck:  Without mass or deformity.  Trachea is midline. Lungs:  Clear to auscultate bilaterally without rhonchi, wheeze, or rales. Heart:  Regular rate and rhythm.  Without murmurs, rubs, or gallops. Abdomen: Soft, questionably tender, nondistended without guarding. Extremities: Without cyanosis, clubbing, edema, or obvious deformity. Neuro: Awake alert, orientation difficult to assess given poor verbal status.  Currently able to follow commands with verbal and mimicking as well as recall his wife's name. Skin:  Warm and dry, no erythema.  Data Reviewed: I have personally reviewed following labs and imaging studies  CBC: Recent Labs  Lab 02/01/24 0431 02/02/24 0615 02/03/24 0825 02/05/24 1514 02/06/24 0635  WBC 13.2* 12.0* 16.1* 9.9 9.5  NEUTROABS  --   --  14.2* 8.9*  --   HGB 18.1* 16.6 16.8 16.5 15.0  HCT 53.0* 51.3 50.2 49.5 45.9  MCV 90.0 93.1 89.2 89.4 91.6  PLT 319 326 317 303 256    Basic Metabolic Panel: Recent Labs  Lab 02/01/24 0431 02/02/24 0615 02/03/24 0825 02/05/24 1208 02/05/24 1514 02/06/24 0635 02/07/24 0315  NA 131*   < > 129* 132* 129* 130* 134*  K 4.8   < > 4.9 4.7 5.3* 4.5 3.6  CL 98   < > 96* 99 93* 97* 97*  CO2 19*   < > 24 21* 24 19* 25  GLUCOSE 119*   < > 148* 155* 147* 127* 108*  BUN 41*   < > 33* 56* 57* 47* 38*  CREATININE 1.44*   < > 1.31* 1.41* 1.59* 1.22 1.13  CALCIUM 8.7*   < > 8.8* 7.4* 8.5* 8.1* 7.7*  MG 2.6*  --   --   --  2.7* 2.9*  --   PHOS  --   --   --   --   --  3.6  --    < > = values in this interval not displayed.   GFR: Estimated Creatinine Clearance: 77.3 mL/min (by C-G formula based on SCr of 1.13 mg/dL).  Home Liver Function Tests: Recent Labs  Lab 02/01/24 0431 02/03/24 0825 02/05/24 1208 02/05/24 1514 02/06/24 0635  AST 33 22 57* 66* 56*  ALT 37 20 65* 75* 62*  ALKPHOS 85 84 71 84 63  BILITOT 0.4 0.5 0.4 0.5 1.0  PROT 7.3 6.7 5.5* 6.4* 5.7*  ALBUMIN 3.0* 3.3* 2.9* 3.4* 2.6*   Recent Labs  Lab 02/01/24 0431 02/05/24 1514  LIPASE  106* 39   Recent Labs  Lab 02/05/24 1514  AMMONIA 39*   CBG: Recent Labs  Lab 02/05/24 2302  GLUCAP 146*   Sepsis Labs: Recent Labs  Lab 02/05/24 1934 02/05/24 2138 02/06/24 0635  PROCALCITON  --   --  2.64  LATICACIDVEN 1.4 1.7  --     Recent Results (from the past 240 hours)  Blood culture (routine x 2)     Status: None (Preliminary result)   Collection Time: 02/05/24  6:59 PM   Specimen: BLOOD  Result Value Ref Range Status   Specimen Description   Final    BLOOD Performed at [facility], [address] [place], [place] [address]    Special Requests   Final    NONE Performed at [facility], [address], [place], [place] [address]    Culture   Final    NO GROWTH < 12 HOURS Performed at [facility], [address], [place], [place] [address]    Report Status PENDING  Incomplete  Blood  culture (routine x 2)     Status: None (Preliminary result)   Collection Time: 02/05/24  7:04 PM   Specimen: BLOOD  Result Value Ref Range Status   Specimen Description   Final    BLOOD BLOOD RIGHT ARM Performed at [facility], [address] [place], [place] [address]    Special Requests   Final    Blood Culture adequate volume BOTTLES DRAWN AEROBIC AND ANAEROBIC Performed at [facility], [address] [place], [place] [address]    Culture   Final    NO GROWTH < 12 HOURS Performed at [facility], [address], [place], [place] [address]    Report Status PENDING  Incomplete  Resp panel by RT-PCR (RSV, Flu A&B, Covid) Peripheral     Status: None   Collection Time: 02/05/24  7:34 PM   Specimen: Peripheral; Nasal Swab  Result Value Ref Range Status   SARS Coronavirus 2 by RT PCR NEGATIVE NEGATIVE Final    Comment: (NOTE) SARS-CoV-2 target nucleic acids are NOT DETECTED.  The SARS-CoV-2 RNA is generally detectable in upper respiratory specimens during the acute phase of infection. The lowest concentration of SARS-CoV-2 viral copies this assay can detect is 138 copies/mL. A negative result does not preclude SARS-Cov-2 infection and should not be used as the sole basis for treatment or other patient management decisions. A negative result may occur with  improper specimen collection/handling, submission of specimen other than nasopharyngeal swab, presence of viral mutation(s) within the areas targeted by this assay, and inadequate number of viral copies(<138 copies/mL). A negative result must be combined with clinical observations, patient history, and epidemiological information. The expected result is Negative.  Fact Sheet for Patients:  BloggerCourse.com  Fact Sheet for Healthcare Providers:  SeriousBroker.it  This test is no t yet approved or cleared by the [place]  FDA and  has been authorized for detection and/or diagnosis of SARS-CoV-2 by FDA under an Emergency Use Authorization (EUA). This EUA will remain  in effect (meaning this test can be used) for the duration of the COVID-19 declaration under Section 564(b)(1) of the Act, 21 U.S.C.section 360bbb-3(b)(1), unless the authorization is terminated  or revoked sooner.       Influenza A by PCR NEGATIVE NEGATIVE Final   Influenza B by PCR NEGATIVE NEGATIVE Final    Comment: (NOTE) The Xpert Xpress SARS-CoV-2/FLU/RSV plus assay is intended as an  aid in the diagnosis of influenza from Nasopharyngeal swab specimens and should not be used as a sole basis for treatment. Nasal washings and aspirates are unacceptable for Xpert Xpress SARS-CoV-2/FLU/RSV testing.  Fact Sheet for Patients: BloggerCourse.com  Fact Sheet for Healthcare Providers: SeriousBroker.it  This test is not yet approved or cleared by the [place] FDA and has been authorized for detection and/or diagnosis of SARS-CoV-2 by FDA under an Emergency Use Authorization (EUA). This EUA will remain in effect (meaning this test can be used) for the duration of the COVID-19 declaration under Section 564(b)(1) of the Act, 21 U.S.C. section 360bbb-3(b)(1), unless the authorization is terminated or revoked.     Resp Syncytial Virus by PCR NEGATIVE NEGATIVE Final    Comment: (NOTE) Fact Sheet for Patients: BloggerCourse.com  Fact Sheet for Healthcare Providers: SeriousBroker.it  This test is not yet approved or cleared by the [place] FDA and has been authorized for detection and/or diagnosis of SARS-CoV-2 by FDA under an Emergency Use Authorization (EUA). This EUA will remain in effect (meaning this test can be used) for the duration of the COVID-19 declaration under Section 564(b)(1) of the Act, 21 U.S.C. section  360bbb-3(b)(1), unless the authorization is terminated or revoked.  Performed at [facility], [address] [place], [place] [address]          Radiology Studies: MR Brain W and Wo Contrast Result Date: 02/05/2024 CLINICAL DATA:  Provided history: Mental status change, unknown cause. Additional history provided: History of gastric cancer. EXAM: MRI HEAD WITHOUT AND WITH CONTRAST TECHNIQUE: Multiplanar, multiecho pulse sequences of the brain and surrounding structures were obtained without and with intravenous contrast. CONTRAST:  9mL GADAVIST GADOBUTROL 1 MMOL/ML IV SOLN COMPARISON:  None. FINDINGS: Intermittently motion degraded examination. Most notably, the axial T1-weighted post-contrast sequence is mild-to-moderately motion degraded. Within this limitation, findings are as follows. Brain: Cerebral volume is normal. No cortical encephalomalacia is identified. No significant cerebral white matter disease for age. There is no acute infarct. No evidence of an intracranial mass. No chronic intracranial blood products. No extra-axial fluid collection. No midline shift. No pathologic intracranial enhancement identified. Vascular: Maintained flow voids within the proximal large arterial vessels. 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 Skull and upper cervical spine: No focal worrisome marrow lesion. Sinuses/Orbits: No mass or acute finding within the imaged orbits. 10 mm mucous retention cyst within the left sphenoid sinus. Minimal mucosal thickening within right ethmoid air cells. IMPRESSION: 1. Intermittently motion degraded examination (most notably, there is mild-to-moderate motion degradation of the axial T1-weighted post-contrast sequence). 2. Within this limitation, no evidence of intracranial metastatic disease or an acute intracranial abnormality. 3. Developmental venous anomaly within the right subinsular region (anatomic  variant). 4. Otherwise unremarkable MRI appearance of the brain for age. 5. Mild paranasal sinus disease as described. Electronically Signed   By: Jackey Loge D.O.   On: 02/05/2024 18:56   DG Chest Portable 1 View Result Date: 02/05/2024 CLINICAL DATA:  Weakness. EXAM: PORTABLE CHEST 1 VIEW COMPARISON:  08/22/2020 FINDINGS: Port in the anterior chest wall with tip in distal SVC. Normal cardiac silhouette. Low lung volumes. There is patchy perihilar airspace disease. No pneumothorax. No focal consolidation. IMPRESSION: Patchy perihilar airspace disease suggests pulmonary edema versus multifocal pneumonia. Electronically Signed   By: Genevive Bi M.D.   On: 02/05/2024 15:50   Scheduled Meds:  Chlorhexidine Gluconate Cloth  6 each Topical Daily   enoxaparin (LOVENOX) injection  40 mg Subcutaneous  Q24H   feeding supplement  1 Container Oral TID BM   sodium chloride flush  10-40 mL Intracatheter Q12H   Continuous Infusions:  azithromycin 500 mg (02/06/24 1044)   cefTRIAXone (ROCEPHIN)  IV 2 g (02/06/24 0951)     LOS: 2 days   Time spent:  Azucena Fallen, DO [facility]  If 7PM-7AM, please contact night-coverage [email or web address]  02/07/2024, 7:19 AM

## 2024-02-08 ENCOUNTER — Other Ambulatory Visit: Payer: Self-pay

## 2024-02-08 DIAGNOSIS — N170 Acute kidney failure with tubular necrosis: Secondary | ICD-10-CM | POA: Diagnosis not present

## 2024-02-08 LAB — CBC
HCT: 41.8 % (ref 39.0–52.0)
Hemoglobin: 13.6 g/dL (ref 13.0–17.0)
MCH: 29.8 pg (ref 26.0–34.0)
MCHC: 32.5 g/dL (ref 30.0–36.0)
MCV: 91.5 fL (ref 80.0–100.0)
Platelets: 214 10*3/uL (ref 150–400)
RBC: 4.57 MIL/uL (ref 4.22–5.81)
RDW: 12.2 % (ref 11.5–15.5)
WBC: 6.3 10*3/uL (ref 4.0–10.5)
nRBC: 0 % (ref 0.0–0.2)

## 2024-02-08 LAB — BASIC METABOLIC PANEL
Anion gap: 13 (ref 5–15)
BUN: 28 mg/dL — ABNORMAL HIGH (ref 6–20)
CO2: 25 mmol/L (ref 22–32)
Calcium: 8 mg/dL — ABNORMAL LOW (ref 8.9–10.3)
Chloride: 101 mmol/L (ref 98–111)
Creatinine, Ser: 0.94 mg/dL (ref 0.61–1.24)
GFR, Estimated: >60 mL/min (ref >60)
Glucose, Bld: 109 mg/dL — ABNORMAL HIGH (ref 70–99)
Potassium: 3.3 mmol/L — ABNORMAL LOW (ref 3.5–5.1)
Sodium: 139 mmol/L (ref 135–145)

## 2024-02-08 MED ORDER — MAGIC MOUTHWASH W/LIDOCAINE
10.0000 mL | Freq: Two times a day (BID) | ORAL | Status: AC
  Administered 2024-02-08 – 2024-02-09 (×2): 10 mL via ORAL
  Filled 2024-02-08 (×2): qty 10

## 2024-02-08 MED ORDER — OLANZAPINE 2.5 MG PO TABS
2.5000 mg | ORAL_TABLET | Freq: Every day | ORAL | Status: AC
  Administered 2024-02-08: 2.5 mg via ORAL
  Filled 2024-02-08: qty 1

## 2024-02-08 MED ORDER — LORAZEPAM 2 MG/ML IJ SOLN
0.2500 mg | Freq: Once | INTRAMUSCULAR | Status: AC
  Administered 2024-02-08: 0.25 mg via INTRAVENOUS
  Filled 2024-02-08: qty 1

## 2024-02-08 MED ORDER — LACTULOSE 10 GM/15ML PO SOLN
30.0000 g | Freq: Once | ORAL | Status: AC
  Administered 2024-02-08: 30 g via ORAL
  Filled 2024-02-08: qty 45

## 2024-02-08 NOTE — Plan of Care (Signed)
  Problem: Coping: Goal: Level of anxiety will decrease Outcome: Progressing   Problem: Nutrition: Goal: Adequate nutrition will be maintained Outcome: Progressing   Problem: Safety: Goal: Ability to remain free from injury will improve Outcome: Progressing   

## 2024-02-08 NOTE — Progress Notes (Signed)
 PROGRESS NOTE    [name]  ZOX:[id number] DOB: 04-Sep-1966 DOA: 02/05/2024 PCP: Tiffany Kocher, DO   Brief Narrative:  Henry May is a 58 y.o. 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  Earlier this week he had his first chemotherapy of FOLFOX.  Reportedly since that time he has gotten progressively weaker with nausea, vomiting, and now presents with acute onset altered mental status 24 hours prior to admission.  Hospitalist for admission, oncology called in consult.  Assessment & Plan:   Principal Problem:   Acute kidney injury (AKI) with acute tubular necrosis (ATN) (HCC)   Acute metabolic encephalopathy versus toxic encephalopathy, minimally improving  Delirium with active hallucinations -Unclear etiology, concern for polypharmacy versus infectious process -Trial lactulose x 1 given minimally elevated ammonia unlikely major cause -Home tramadol count concurrent with recent prescription filled, he has not filled/initiated benzodiazepine prescription per family -Treating with broad-spectrum antibiotics to cover pneumonia versus UTI -procalcitonin elevated at intake -MRI and intake negative for acute process -Rule out delirium/hallucinations - sleep cycle difficulties at home -Ativan overnight for agitation x 1, hold further sedatives as family is at bedside to redirect patient -Patient continues to become agitated, he has now pulled out his port multiple times. -Trial low-dose olanzapine -follow QT closely as below  Prolonged QT, chronic  -Baseline QTc ranging 470-550 per EKG review over the past few years without history of dysrhythmia. -Currently 490s -will trial low-dose olanzapine (lowest risk for QT prolongation in its class)  Intractable nausea vomiting resolved -Likely in the setting of recent chemotherapy versus known  gastric cancer and carcinomatosis -Improving with supportive care -Advance diet as tolerated, tolerating clear liquids - trial soft diet  AKI, resolved Hyponatremia, hypovolemic, resolved Hyperkalemia, mild, resolved -Previous creatinine within normal limits, over the past 3 weeks patient's creatinine and GFR have been somewhat diminished with minimally elevated BUN(although likely not elevated enough to cause mental status changes as above) -Advance diet as tolerated, DC IV fluids -Lab abnormalities in the setting of poor p.o. intake improving  HF, not in acute exacerbation -Patient appears hypovolemic, elevated BNP is not useful as a single lab -IV fluids off continue to advance p.o. intake  Dysphagia -Questionably new onset versus related to mental status changes as above, speech following, appreciate insight and recommendations -Advance diet as tolerated given improvement in mental status (see above)  Questionable pneumonia, POA -Chest x-ray with questionable bilateral opacities concerning for possible pneumonia versus pulmonary edema or aspiration -continue 5 days of antibiotics -Continue broad-spectrum antibiotics -(transition from azithromycin to doxycycline given QT prolongation), no recent illnesses or sick contacts per wife although procalcitonin is elevated with no other obvious source of infection   Generalized weakness PT OT evaluation pending, likely secondary to above Fall precautions in place given high fall risk   Recently diagnosed 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 Recently dosed with first chemotherapy of FOLFOX. Oncology following, appreciate insight recommendations  DVT prophylaxis: enoxaparin (LOVENOX) injection 40 mg Start: 02/06/24 1000 Code Status:   Code Status: Full Code Family Communication: Wife and daughter bedside  Status is: Inpatient  Dispo: The patient is from: Home              Anticipated d/c is to: To  be determined              Anticipated d/c date is: To be determined  Patient currently not medically stable for discharge  Consultants:  Oncology  Procedures:  None  Antimicrobials:  Azithromycin, ceftriaxone  Subjective: Mental status generally improving, required Ativan again overnight for agitation but much more interactive and following commands today speaking most Spanish with very labile emotional state.  Objective: Vitals:   02/06/24 1816 02/06/24 2127 02/07/24 1349 02/07/24 2023  BP:  102/63 105/73 106/79  Pulse:  83 75 84  Resp:   17 20  Temp: 98.4 F (36.9 C) 98.9 F (37.2 C) 98 F (36.7 C)   TempSrc: Oral Oral    SpO2:  97% 96%   Weight:      Height:        Intake/Output Summary (Last 24 hours) at 02/08/2024 0725 Last data filed at 02/08/2024 0500 Gross per 24 hour  Intake 480 ml  Output --  Net 480 ml   Filed Weights   02/05/24 1253 02/06/24 0500  Weight: 89.6 kg 91.6 kg    Examination:  General:  Pleasantly resting in bed, No acute distress. HEENT:  Normocephalic atraumatic.  Sclerae nonicteric, noninjected.  Extraocular movements intact bilaterally. Neck:  Without mass or deformity.  Trachea is midline. Lungs:  Clear to auscultate bilaterally without rhonchi, wheeze, or rales. Heart:  Regular rate and rhythm.  Without murmurs, rubs, or gallops. Abdomen: Soft, nontender, nondistended without guarding. Extremities: Without cyanosis, clubbing, edema, or obvious deformity. Neuro: Awake alert, orientation difficult to assess given poor verbal status.  Currently able to carry on at least part of conversation coherently but then devolves into nonsense with questionable hallucinations Skin:  Warm and dry, no erythema.  Data Reviewed: I have personally reviewed following labs and imaging studies  CBC: Recent Labs  Lab 02/02/24 0615 02/03/24 0825 02/05/24 1514 02/06/24 0635 02/07/24 0315  WBC 12.0* 16.1* 9.9 9.5 7.1  NEUTROABS  --   14.2* 8.9*  --   --   HGB 16.6 16.8 16.5 15.0 12.8*  HCT 51.3 50.2 49.5 45.9 38.4*  MCV 93.1 89.2 89.4 91.6 91.0  PLT 326 317 303 256 218   Basic Metabolic Panel: Recent Labs  Lab 02/03/24 0825 02/05/24 1208 02/05/24 1514 02/06/24 0635 02/07/24 0315  NA 129* 132* 129* 130* 134*  K 4.9 4.7 5.3* 4.5 3.6  CL 96* 99 93* 97* 97*  CO2 24 21* 24 19* 25  GLUCOSE 148* 155* 147* 127* 108*  BUN 33* 56* 57* 47* 38*  CREATININE 1.31* 1.41* 1.59* 1.22 1.13  CALCIUM 8.8* 7.4* 8.5* 8.1* 7.7*  MG  --   --  2.7* 2.9*  --   PHOS  --   --   --  3.6  --    GFR: Estimated Creatinine Clearance: 77.3 mL/min (by C-G formula based on SCr of 1.13 mg/dL).  Home Liver Function Tests: Recent Labs  Lab 02/03/24 0825 02/05/24 1208 02/05/24 1514 02/06/24 0635  AST 22 57* 66* 56*  ALT 20 65* 75* 62*  ALKPHOS 84 71 84 63  BILITOT 0.5 0.4 0.5 1.0  PROT 6.7 5.5* 6.4* 5.7*  ALBUMIN 3.3* 2.9* 3.4* 2.6*   Recent Labs  Lab 02/05/24 1514  LIPASE 39   Recent Labs  Lab 02/05/24 1514  AMMONIA 39*   CBG: Recent Labs  Lab 02/05/24 2302  GLUCAP 146*   Sepsis Labs: Recent Labs  Lab 02/05/24 1934 02/05/24 2138 02/06/24 0635  PROCALCITON  --   --  2.64  LATICACIDVEN 1.4 1.7  --     Recent Results (from the past  240 hours)  Blood culture (routine x 2)     Status: None (Preliminary result)   Collection Time: 02/05/24  6:59 PM   Specimen: BLOOD  Result Value Ref Range Status   Specimen Description   Final    BLOOD Performed at [facility], [address] [place], [place] [address]    Special Requests   Final    NONE Performed at [facility], [address] [place], [place] [address]    Culture   Final    NO GROWTH 2 DAYS Performed at [facility], [address], [place], [place] [address]    Report Status PENDING  Incomplete  Blood culture (routine x 2)     Status: None (Preliminary result)   Collection Time: 02/05/24  7:04 PM    Specimen: BLOOD  Result Value Ref Range Status   Specimen Description   Final    BLOOD BLOOD RIGHT ARM Performed at [facility], [address], [place], [place] [address]    Special Requests   Final    Blood Culture adequate volume BOTTLES DRAWN AEROBIC AND ANAEROBIC Performed at [facility], [address], [place], [place] [address]    Culture   Final    NO GROWTH 2 DAYS Performed at [facility], [address] [place], [place] [address]    Report Status PENDING  Incomplete  Resp panel by RT-PCR (RSV, Flu A&B, Covid) Peripheral     Status: None   Collection Time: 02/05/24  7:34 PM   Specimen: Peripheral; Nasal Swab  Result Value Ref Range Status   SARS Coronavirus 2 by RT PCR NEGATIVE NEGATIVE Final    Comment: (NOTE) SARS-CoV-2 target nucleic acids are NOT DETECTED.  The SARS-CoV-2 RNA is generally detectable in upper respiratory specimens during the acute phase of infection. The lowest concentration of SARS-CoV-2 viral copies this assay can detect is 138 copies/mL. A negative result does not preclude SARS-Cov-2 infection and should not be used as the sole basis for treatment or other patient management decisions. A negative result may occur with  improper specimen collection/handling, submission of specimen other than nasopharyngeal swab, presence of viral mutation(s) within the areas targeted by this assay, and inadequate number of viral copies(<138 copies/mL). A negative result must be combined with clinical observations, patient history, and epidemiological information. The expected result is Negative.  Fact Sheet for Patients:  BloggerCourse.com  Fact Sheet for Healthcare Providers:  SeriousBroker.it  This test is no t yet approved or cleared by the [place] FDA and  has been authorized for detection and/or diagnosis of SARS-CoV-2 by FDA under an Emergency Use  Authorization (EUA). This EUA will remain  in effect (meaning this test can be used) for the duration of the COVID-19 declaration under Section 564(b)(1) of the Act, 21 U.S.C.section 360bbb-3(b)(1), unless the authorization is terminated  or revoked sooner.       Influenza A by PCR NEGATIVE NEGATIVE Final   Influenza B by PCR NEGATIVE NEGATIVE Final    Comment: (NOTE) The Xpert Xpress SARS-CoV-2/FLU/RSV plus assay is intended as an aid in the diagnosis of influenza from Nasopharyngeal swab specimens and should not be used as a sole basis for treatment. Nasal washings and aspirates are unacceptable for Xpert Xpress SARS-CoV-2/FLU/RSV testing.  Fact Sheet for Patients: BloggerCourse.com  Fact Sheet for Healthcare Providers: SeriousBroker.it  This test is not yet approved or cleared by the [place] FDA and has been authorized for detection  and/or diagnosis of SARS-CoV-2 by FDA under an Emergency Use Authorization (EUA). This EUA will remain in effect (meaning this test can be used) for the duration of the COVID-19 declaration under Section 564(b)(1) of the Act, 21 U.S.C. section 360bbb-3(b)(1), unless the authorization is terminated or revoked.     Resp Syncytial Virus by PCR NEGATIVE NEGATIVE Final    Comment: (NOTE) Fact Sheet for Patients: BloggerCourse.com  Fact Sheet for Healthcare Providers: SeriousBroker.it  This test is not yet approved or cleared by the [place] FDA and has been authorized for detection and/or diagnosis of SARS-CoV-2 by FDA under an Emergency Use Authorization (EUA). This EUA will remain in effect (meaning this test can be used) for the duration of the COVID-19 declaration under Section 564(b)(1) of the Act, 21 U.S.C. section 360bbb-3(b)(1), unless the authorization is terminated or revoked.  Performed at [facility], [address], [place], [place] [address]          Radiology Studies: No results found.  Scheduled Meds:  Chlorhexidine Gluconate Cloth  6 each Topical Daily   enoxaparin (LOVENOX) injection  40 mg Subcutaneous Q24H   feeding supplement  1 Container Oral TID BM   sodium chloride flush  10-40 mL Intracatheter Q12H   Continuous Infusions:  cefTRIAXone (ROCEPHIN)  IV Stopped (02/07/24 1057)   doxycycline (VIBRAMYCIN) IV Stopped (02/07/24 1733)     LOS: 3 days   Time spent:  Azucena Fallen, DO [facility]  If 7PM-7AM, please contact night-coverage [email or web address]  02/08/2024, 7:25 AM

## 2024-02-08 NOTE — Progress Notes (Signed)
     Patient Name: Henry May           DOB: [birth year]  MRN: [id number]      Admission Date: 02/05/2024  Attending Provider: Azucena Fallen, MD  Primary Diagnosis: Acute kidney injury (AKI) with acute tubular necrosis (ATN) (HCC)   Level of care: Med-Surg   OVERNIGHT PROGRESS REPORT   Significantly restless in bed despite family trying to reorient patient. Unable to follow commands and unable to be redirected by nursing staff. Family has inquired about using Ativan tonight to help patient sleep.  Family reports he was able to rest last night after receiving Ativan; they are concerned that patient is not getting rest/sleep.  Prior EKG showing prolonged QTc, therefore not be a candidate for Haldol or Seroquel.  Melatonin has been tried, however patient spits pill out.   Would ideally like to avoid benzodiazepines given encephalopathy, however there is concerns that patient is not receiving adequate sleep.  Will proceed with a low-dose of IV Ativan tonight.   Plan: 0.25 mg IV Ativan x 1    Anthoney Harada, DNP, [employer] [facility] [employer]

## 2024-02-09 ENCOUNTER — Other Ambulatory Visit: Payer: Self-pay

## 2024-02-09 DIAGNOSIS — N170 Acute kidney failure with tubular necrosis: Secondary | ICD-10-CM | POA: Diagnosis not present

## 2024-02-09 LAB — CBC
HCT: 42.5 % (ref 39.0–52.0)
Hemoglobin: 13.9 g/dL (ref 13.0–17.0)
MCH: 30.3 pg (ref 26.0–34.0)
MCHC: 32.7 g/dL (ref 30.0–36.0)
MCV: 92.6 fL (ref 80.0–100.0)
Platelets: 184 10*3/uL (ref 150–400)
RBC: 4.59 MIL/uL (ref 4.22–5.81)
RDW: 12.8 % (ref 11.5–15.5)
WBC: 4.3 10*3/uL (ref 4.0–10.5)
nRBC: 0 % (ref 0.0–0.2)

## 2024-02-09 LAB — COMPREHENSIVE METABOLIC PANEL
ALT: 33 U/L (ref 0–44)
AST: 37 U/L (ref 15–41)
Albumin: 2.4 g/dL — ABNORMAL LOW (ref 3.5–5.0)
Alkaline Phosphatase: 52 U/L (ref 38–126)
Anion gap: 14 (ref 5–15)
BUN: 24 mg/dL — ABNORMAL HIGH (ref 6–20)
CO2: 21 mmol/L — ABNORMAL LOW (ref 22–32)
Calcium: 7.6 mg/dL — ABNORMAL LOW (ref 8.9–10.3)
Chloride: 104 mmol/L (ref 98–111)
Creatinine, Ser: 1.13 mg/dL (ref 0.61–1.24)
GFR, Estimated: >60 mL/min (ref >60)
Glucose, Bld: 95 mg/dL (ref 70–99)
Potassium: 3.4 mmol/L — ABNORMAL LOW (ref 3.5–5.1)
Sodium: 139 mmol/L (ref 135–145)
Total Bilirubin: 0.9 mg/dL (ref 0.0–1.2)
Total Protein: 5.5 g/dL — ABNORMAL LOW (ref 6.5–8.1)

## 2024-02-09 MED ORDER — OLANZAPINE 5 MG PO TABS
5.0000 mg | ORAL_TABLET | ORAL | Status: AC
  Administered 2024-02-09: 5 mg via ORAL
  Filled 2024-02-09: qty 1

## 2024-02-09 MED ORDER — OLANZAPINE 5 MG PO TABS
7.5000 mg | ORAL_TABLET | Freq: Every day | ORAL | Status: DC
  Administered 2024-02-09: 7.5 mg via ORAL
  Filled 2024-02-09: qty 1

## 2024-02-09 NOTE — Progress Notes (Signed)
 Chaplain was paged to provide support to [place] and his family.  During the time that chaplain was in the room, [place] became agitated.  Chaplain stepped out in case he was overstimulated.  Chaplain provided support to Lani's daughter outside the room.  She and her mom are taking turns trying to rest and be there for him.  They are exhausted and it is incredibly painful to see him like this. Chaplain will continue to check in to provide support, but please page as needs arise.  [phone].    Chaplain [employer], [facility]

## 2024-02-09 NOTE — Progress Notes (Addendum)
 Henry May   DOB:August 09, 1966   ZO#:[id number]      ASSESSMENT & PLAN:  1.  Altered mental status Acute metabolic encephalopathy - Admitted 02/05/2024 with confusion.  It was noted when he went to outpatient oncology office for chemotherapy pump disconnection but he seemed to be confused. - MRI of brain done on 02/05/2024 showed no evidence of intracranial mets or acute intracranial abnormality. - Unclear etiology at this point, Onc/Dr. Truett Perna does not think related to chemotherapy. - Continue supportive care and IV hydration - Consider Neuro evaluation for persistent altered mental status - Continue to monitor closely   2.  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  CT scans and PET scan subsequently done which showed lymph node involvement. - Decision was made to start him on FOLFOX chemotherapy.  Cycle 1 started 02/03/2024 and pump disconnected 02/05/2024. - Medical oncology/Dr. Truett Perna following patient closely   3.  History of bilateral lower extremity DVT - In 2021 - Anticoagulation was discontinued due to massive retroperitoneal bleed and patient had IVC filter placed 08/24/2020.   4.  Hyponatremia - Electrolyte replacement per Medicine   5.  Transaminitis - Monitor CMP. - T. bili WNL    Code Status Full  Subjective:  Patient seen awake and alert in wheelchair in hallway with daughter and staff.  Remains confused per daughter who states he has not slept for several days and has been very agitated.  At the present time he is calm.  No acute distress is noted however obviously confused.  Objective:  Vitals:   02/08/24 2214 02/09/24 0547  BP: (!) 120/92 112/76  Pulse: 85 75  Resp: 20 20  Temp: 98.5 F (36.9 C) 98.2 F (36.8 C)  SpO2: 97% 93%     Intake/Output Summary (Last 24 hours) at 02/09/2024 0950 Last data filed at 02/09/2024 0845 Gross per  24 hour  Intake 1640.01 ml  Output --  Net 1640.01 ml     REVIEW OF SYSTEMS: Unable to fully obtain Behavioral/Psych: + Altered mental status  all other systems were reviewed with the patient and are negative.  PHYSICAL EXAMINATION: ECOG PERFORMANCE STATUS: 3 - Symptomatic, >50% confined to bed  Vitals:   02/08/24 2214 02/09/24 0547  BP: (!) 120/92 112/76  Pulse: 85 75  Resp: 20 20  Temp: 98.5 F (36.9 C) 98.2 F (36.8 C)  SpO2: 97% 93%   Filed Weights   02/06/24 0500 02/08/24 0702 02/09/24 0500  Weight: 201 lb 15.1 oz (91.6 kg) 199 lb 11.8 oz (90.6 kg) 199 lb 4.7 oz (90.4 kg)    GENERAL: alert, no distress and comfortable SKIN: skin color, texture, turgor are normal, no rashes or significant lesions EYES: normal, conjunctiva are pink and non-injected, sclera clear OROPHARYNX: no exudate, no erythema and lips, buccal mucosa, and tongue normal  NECK: supple, thyroid normal size, non-tender, without nodularity LYMPH: no palpable lymphadenopathy in the cervical, axillary or inguinal LUNGS: clear to auscultation and percussion with normal breathing effort HEART: regular rate & rhythm and no murmurs and no lower extremity edema ABDOMEN: abdomen soft, non-tender and normal bowel sounds MUSCULOSKELETAL: no cyanosis of digits and no clubbing  PSYCH: alert & oriented x 3 with fluent speech NEURO: no focal motor/sensory deficits   All questions were answered. The patient knows to call the clinic with any problems, questions or concerns.   The total time spent in the appointment was 40  minutes encounter with patient including review of chart and various tests results, discussions about plan of care and coordination of care plan  Dawson Bills, NP 02/09/2024 9:50 AM    Labs Reviewed:  Lab Results  Component Value Date   WBC 4.3 02/09/2024   HGB 13.9 02/09/2024   HCT 42.5 02/09/2024   MCV 92.6 02/09/2024   PLT 184 02/09/2024   Recent Labs    02/05/24 1514  02/06/24 0635 02/07/24 0315 02/08/24 0826 02/09/24 0807  NA 129* 130* 134* 139 139  K 5.3* 4.5 3.6 3.3* 3.4*  CL 93* 97* 97* 101 104  CO2 24 19* 25 25 21*  GLUCOSE 147* 127* 108* 109* 95  BUN 57* 47* 38* 28* 24*  CREATININE 1.59* 1.22 1.13 0.94 1.13  CALCIUM 8.5* 8.1* 7.7* 8.0* 7.6*  GFRNONAA 50* >60 >60 >60 >60  PROT 6.4* 5.7*  --   --  5.5*  ALBUMIN 3.4* 2.6*  --   --  2.4*  AST 66* 56*  --   --  37  ALT 75* 62*  --   --  33  ALKPHOS 84 63  --   --  52  BILITOT 0.5 1.0  --   --  0.9    Studies Reviewed:  MR Brain W and Wo Contrast Result Date: 02/05/2024 CLINICAL DATA:  Provided history: Mental status change, unknown cause. Additional history provided: History of gastric cancer. EXAM: MRI HEAD WITHOUT AND WITH CONTRAST TECHNIQUE: Multiplanar, multiecho pulse sequences of the brain and surrounding structures were obtained without and with intravenous contrast. CONTRAST:  9mL GADAVIST GADOBUTROL 1 MMOL/ML IV SOLN COMPARISON:  None. FINDINGS: Intermittently motion degraded examination. Most notably, the axial T1-weighted post-contrast sequence is mild-to-moderately motion degraded. Within this limitation, findings are as follows. Brain: Cerebral volume is normal. No cortical encephalomalacia is identified. No significant cerebral white matter disease for age. There is no acute infarct. No evidence of an intracranial mass. No chronic intracranial blood products. No extra-axial fluid collection. No midline shift. No pathologic intracranial enhancement identified. Vascular: Maintained flow voids within the proximal large arterial vessels. 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 Skull and upper cervical spine: No focal worrisome marrow lesion. Sinuses/Orbits: No mass or acute finding within the imaged orbits. 10 mm mucous retention cyst within the left sphenoid sinus. Minimal mucosal thickening within right ethmoid air cells. IMPRESSION: 1.  Intermittently motion degraded examination (most notably, there is mild-to-moderate motion degradation of the axial T1-weighted post-contrast sequence). 2. Within this limitation, no evidence of intracranial metastatic disease or an acute intracranial abnormality. 3. 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 4. Otherwise unremarkable MRI appearance of the brain for age. 5. Mild paranasal sinus disease as described. Electronically Signed   By: Jackey Loge D.O.   On: 02/05/2024 18:56   DG Chest Portable 1 View Result Date: 02/05/2024 CLINICAL DATA:  Weakness. EXAM: PORTABLE CHEST 1 VIEW COMPARISON:  08/22/2020 FINDINGS: Port in the anterior chest wall with tip in distal SVC. Normal cardiac silhouette. Low lung volumes. There is patchy perihilar airspace disease. No pneumothorax. No focal consolidation. IMPRESSION: Patchy perihilar airspace disease suggests pulmonary edema versus multifocal pneumonia. Electronically Signed   By: Genevive Bi M.D.   On: 02/05/2024 15:50   US Paracentesis Result Date: 02/02/2024 INDICATION: 58 year old male with a history of gastric cancer with recurrent malignant ascites. EXAM: ULTRASOUND GUIDED RIGHT PARACENTESIS MEDICATIONS: 8 mL 1% Lidocaine COMPLICATIONS: None immediate. PROCEDURE: Informed  written consent was obtained from the patient after a discussion of the risks, benefits and alternatives to treatment. A timeout was performed prior to the initiation of the procedure. Initial ultrasound scanning demonstrates a large amount of ascites within the right lower abdominal quadrant. The right lower abdomen was prepped and draped in the usual sterile fashion. 1% lidocaine was used for local anesthesia. Following this, a 19 gauge, 7-cm, Yueh catheter was introduced. An ultrasound image was saved for documentation purposes. The paracentesis was performed. The catheter was removed and a dressing was applied. The patient tolerated the  procedure well without immediate post procedural complication. FINDINGS: A total of approximately 4.4 L of clear yellow fluid was removed. Samples were sent to the laboratory as requested by the clinical team. IMPRESSION: Successful ultrasound-guided paracentesis yielding 4.4 liters of peritoneal fluid. PLAN: If the patient eventually requires >/=2 paracenteses in a 30 day period, candidacy for formal evaluation by the [facility] will be assessed. Performed By Theresa Mulligan, PA-C Electronically Signed   By: Corlis Leak M.D.   On: 02/02/2024 10:55   CT ABDOMEN PELVIS WO CONTRAST Result Date: 02/01/2024 CLINICAL DATA:  Abdominal pain, acute, nonlocalized. History of gastric cancer, metastatic. * Tracking Code: BO * EXAM: CT ABDOMEN AND PELVIS WITHOUT CONTRAST TECHNIQUE: Multidetector CT imaging of the abdomen and pelvis was performed following the standard protocol without IV contrast. RADIATION DOSE REDUCTION: This exam was performed according to the departmental dose-optimization program which includes automated exposure control, adjustment of the mA and/or kV according to patient size and/or use of iterative reconstruction technique. COMPARISON:  January 07, 2024. FINDINGS: Evaluation is limited by lack of IV contrast. Lower chest: No acute abnormality. Similar appearance of bibasilar interstitial lung disease. Hepatobiliary: Gallbladder is distended. No extrahepatic biliary ductal dilation. No new focal hepatic abnormality. Pancreas: No new peripancreatic fat stranding. Spleen: Unremarkable. Adrenals/Urinary Tract: Adrenal glands are unremarkable. No hydronephrosis. Nonobstructing bilateral nephrolithiasis. Bladder is unremarkable. Stomach/Bowel: No evidence of bowel obstruction. Similar appearance of gastric wall thickening consistent with known malignancy. Vascular/Lymphatic: No aneurysmal dilation of the abdominal aorta. IVC filter. Similar shotty  lymphadenopathy throughout the upper abdomen. Representative porta hepatic lymph node measures 8 mm in the short axis, previously 5 mm (series 2, image 24). Infiltrative soft tissue throughout the upper abdomen consistent with peritoneal carcinomatosis. Reproductive: Prostate is unremarkable. Other: Moderate volume ascites. Revisualization of interdigitating soft tissue throughout the omentum, favored overall increased since prior PET CT. This is consistent with biopsy-proven peritoneal carcinomatosis. Fat containing inguinal hernias. Musculoskeletal: No acute or significant osseous findings. IMPRESSION: 1. No evidence of bowel obstruction. 2. Similar appearance of gastric wall thickening consistent with known malignancy. 3. Moderate volume ascites with peritoneal carcinomatosis, favored overall increased since prior PET CT. 4. Nonobstructing bilateral nephrolithiasis. Electronically Signed   By: Meda Klinefelter M.D.   On: 02/01/2024 14:02   US Paracentesis Result Date: 01/26/2024 INDICATION: Patient with history of gastric cancer with recurrent malignant ascites. Request received for diagnostic and therapeutic paracentesis up to 4 liters. EXAM: ULTRASOUND GUIDED DIAGNOSTIC AND THERAPEUTIC PARACENTESIS MEDICATIONS: 8 mL 1% lidocaine COMPLICATIONS: None immediate. PROCEDURE: Informed written consent was obtained from the patient after a discussion of the risks, benefits and alternatives to treatment. A timeout was performed prior to the initiation of the procedure. Initial ultrasound scanning demonstrates a moderate amount of ascites within the right lower abdominal quadrant. The right lower abdomen was prepped and draped in the usual sterile fashion. 1% lidocaine was used for  local anesthesia. Following this, a 19 gauge, 7-cm, Yueh catheter was introduced. An ultrasound image was saved for documentation purposes. The paracentesis was performed. The catheter was removed and a dressing was applied. The patient  tolerated the procedure well without immediate post procedural complication. FINDINGS: A total of approximately 3.1 liters of slightly hazy, yellow fluid was removed. Samples were sent to the laboratory as requested by the clinical team. IMPRESSION: Successful ultrasound-guided paracentesis yielding 3.1 liters of peritoneal fluid. Performed by: Artemio Aly Electronically Signed   By: Corlis Leak M.D.   On: 01/26/2024 12:43   IR Paracentesis Result Date: 01/22/2024 INDICATION: 58 year old with gastric cancer and evidence for peritoneal disease. Patient has developed a large volume of ascites EXAM: ULTRASOUND GUIDED DIAGNOSTIC AND THERAPEUTIC PARACENTESIS MEDICATIONS: None. COMPLICATIONS: None immediate. PROCEDURE: Informed written consent was obtained from the patient after a discussion of the risks, benefits and alternatives to treatment. A timeout was performed prior to the initiation of the procedure. Initial ultrasound scanning demonstrates a large amount of ascites within the right upper abdominal quadrant. The right lower abdomen was prepped and draped in the usual sterile fashion. 1% lidocaine was used for local anesthesia. Following this, a 6 Fr Safe-T-Centesis catheter was introduced. An ultrasound image was saved for documentation purposes. The paracentesis was performed. The catheter was removed and a dressing was applied. The patient tolerated the procedure well without immediate post procedural complication. FINDINGS: Largest pocket of fluid was in the right upper abdomen around the liver. A total of approximately 2.9 L of yellow fluid was removed. Samples were sent to the laboratory as requested by the clinical team. IMPRESSION: Successful ultrasound-guided paracentesis yielding 2.9 liters of peritoneal fluid. Electronically Signed   By: Richarda Overlie M.D.   On: 01/22/2024 10:46   IR IMAGING GUIDED PORT INSERTION Result Date: 01/22/2024 INDICATION: 58 year old with gastric cancer. Patient needs  durable venous access for treatment. EXAM: FLUOROSCOPIC AND ULTRASOUND GUIDED PLACEMENT OF A SUBCUTANEOUS PORT COMPARISON:  None Available. MEDICATIONS: Moderate sedation ANESTHESIA/SEDATION: Moderate (conscious) sedation was employed during this procedure. A total of Versed 1 mg and fentanyl 75 mcg was administered intravenously at the order of the provider performing the procedure. Total intra-service moderate sedation time: 26 minutes. Patient's level of consciousness and vital signs were monitored continuously by radiology nurse throughout the procedure under the supervision of the provider performing the procedure. FLUOROSCOPY TIME:  Radiation Exposure Index (as provided by the fluoroscopic device): 4 mGy Kerma COMPLICATIONS: None immediate. PROCEDURE: The procedure, risks, benefits, and alternatives were explained to the patient. Questions regarding the procedure were encouraged and answered. The patient understands and consents to the procedure. Patient was placed supine on the interventional table. Ultrasound confirmed a patent right internal jugular vein. Ultrasound image was saved for documentation. The right chest and neck were cleaned with a skin antiseptic and a sterile drape was placed. Maximal barrier sterile technique was utilized including caps, mask, sterile gowns, sterile gloves, sterile drape, hand hygiene and skin antiseptic. The right neck was anesthetized with 1% lidocaine. Small incision was made in the right neck with a blade. Micropuncture set was placed in the right internal jugular vein with ultrasound guidance. The micropuncture wire was used for measurement purposes. The right chest was anesthetized with 1% lidocaine with epinephrine. #15 blade was used to make an incision and a subcutaneous port pocket was formed. 8 french Power Port was assembled. Subcutaneous tunnel was formed with a stiff tunneling device. The port catheter was brought through the subcutaneous  tunnel. The port was  placed in the subcutaneous pocket. The micropuncture set was exchanged for a peel-away sheath. The catheter was placed through the peel-away sheath and the tip was positioned at the superior cavoatrial junction. Catheter placement was confirmed with fluoroscopy. The port was accessed and flushed with heparinized saline. The port pocket was closed using two layers of absorbable sutures and Dermabond. The vein skin site was closed using a single layer of absorbable suture and Dermabond. Sterile dressings were applied. Patient tolerated the procedure well without an immediate complication. Ultrasound and fluoroscopic images were taken and saved for this procedure. IMPRESSION: Placement of a subcutaneous power-injectable port device. Catheter tip at the superior cavoatrial junction. Electronically Signed   By: Richarda Overlie M.D.   On: 01/22/2024 10:43   CT ABDOMINAL MASS BIOPSY Result Date: 01/22/2024 INDICATION: 58 year old with gastric cancer. Request for peritoneal biopsy to stage the disease. EXAM: CT-GUIDED BIOPSY OF OMENTAL/PERITONEAL THICKENING TECHNIQUE: Multidetector CT imaging of the abdomen was performed following the standard protocol with/without IV contrast. RADIATION DOSE REDUCTION: This exam was performed according to the departmental dose-optimization program which includes automated exposure control, adjustment of the mA and/or kV according to patient size and/or use of iterative reconstruction technique. MEDICATIONS: Moderate sedation ANESTHESIA/SEDATION: Moderate (conscious) sedation was employed during this procedure. A total of Versed 1 mg and Fentanyl 75 mcg was administered intravenously by the radiology nurse. Total intra-service moderate Sedation Time: 15 minutes. The patient's level of consciousness and vital signs were monitored continuously by radiology nursing throughout the procedure under my direct supervision. COMPLICATIONS: None immediate. PROCEDURE: Informed written consent was obtained  from the patient after a thorough discussion of the procedural risks, benefits and alternatives. All questions were addressed. A timeout was performed prior to the initiation of the procedure. CT images through the abdomen were obtained. Extensive omental/peritoneal thickening in the anterior abdomen was identified and targeted for biopsy. The left anterior abdomen was prepped with chlorhexidine and sterile field was created. Skin was anesthetized with 1% lidocaine. Small incision was made. Using CT guidance, a 17 gauge coaxial needle was directed into the left anterior peritoneal thickening. Core biopsies were obtained with an 18 gauge device. Specimens placed in formalin. Needle was removed without complication. Bandage placed over the puncture site. FINDINGS: CT images of the abdomen demonstrate progression of peritoneal disease since 01/07/2024. The patient has now developed increased ascites particularly in the perihepatic region. In addition, there is markedly increased omental/peritoneal thickening in the anterior abdomen. Biopsy needle was directed into the left anterior abdominal peritoneal disease. No immediate bleeding or hematoma formation. IMPRESSION: 1. CT-guided biopsy of omental/peritoneal thickening. 2. Progression of peritoneal disease demonstrated by increased peritoneal thickening and ascites. Electronically Signed   By: Richarda Overlie M.D.   On: 01/22/2024 10:42   US THYROID Result Date: 01/15/2024 CLINICAL DATA:  Incidental on PET. Increased metabolic activity by PET throughout much of the right lobe and possibly extending posterior to the right lobe. EXAM: THYROID ULTRASOUND TECHNIQUE: Ultrasound examination of the thyroid gland and adjacent soft tissues was performed. COMPARISON:  PET scan on 01/07/2024 FINDINGS: Parenchymal Echotexture: Normal Isthmus: 0.7 cm Right lobe: 5.1 x 1.8 x 2.4 cm Left lobe: 4.0 x 1.4 x 1.2 cm _________________________________________________________ Estimated total  number of nodules >/= 1 cm: 1 Number of spongiform nodules >/=  2 cm not described below (TR1): 0 Number of mixed cystic and solid nodules >/= 1.5 cm not described below (TR2): 0 _________________________________________________________ Nodule # 1: Location: Isthmus Maximum  size: 1.1 cm; Other 2 dimensions: 1.1 x 0.7 cm Composition: solid/almost completely solid (2) Echogenicity: hypoechoic (2) Shape: not taller-than-wide (0) Margins: smooth (0) Echogenic foci: none (0) ACR TI-RADS total points: 4. ACR TI-RADS risk category: TR4 (4-6 points). ACR TI-RADS recommendations: *Given size (>/= 1 - 1.4 cm) and appearance, a follow-up ultrasound in 1 year should be considered based on TI-RADS criteria. _________________________________________________________ No abnormal nodule is identified in the right lobe. There is no identifiable abnormal parathyroid soft tissue nodules. No enlarged or abnormal appearing lymph nodes are identified. IMPRESSION: 1. 1.1 cm thyroid isthmus nodule meets criteria for 1 year follow-up ultrasound. 2. No sonographic correlate is identified for the increased metabolic activity within the right lobe or potentially extending posterior to the right lobe. There is no evidence of an obvious parathyroid adenoma by ultrasound. The above is in keeping with the ACR TI-RADS recommendations - J Am Coll Radiol 2017;14:587-595. Electronically Signed   By: Irish Lack M.D.   On: 01/15/2024 13:36  Mr Pracht was interviewed and examined.  He continues to have confusion and agitation.  The etiology of the altered mental status remains unclear.  No infection has been identified.  Oxaliplatin can cause acute neurotoxicity, but his symptoms are not typical.  5-fluorouracil can also cause acute neurotoxicity including hyperammonemic encephalopathy.  I think it is unlikely his symptoms are directly related to chemotherapy, but this is possible.  Recommendations: 1.  Continue supportive care 2.  Repeat  ammonia level 3.  We will consider proceeding with cycle 2 chemotherapy versus changing to a different systemic regimen when his symptoms resolve

## 2024-02-09 NOTE — Plan of Care (Signed)
   Problem: Coping: Goal: Level of anxiety will decrease Outcome: Progressing   Problem: Pain Managment: Goal: General experience of comfort will improve and/or be controlled Outcome: Progressing   Problem: Safety: Goal: Ability to remain free from injury will improve Outcome: Progressing

## 2024-02-09 NOTE — Progress Notes (Signed)
 Chaplain provided support to Henry May's wife in the chapel.  She is struggling to understand why God would bring him through covid only to have something worse. She is worried about her children potentially losing their father soon and is angry that something would happen to such a good man.  She is also very upset about his behavior in the hospital.  She is trying not to take it personally, but it is difficult. Chaplain provided listening as well as emotional support.

## 2024-02-09 NOTE — Progress Notes (Signed)
 PROGRESS NOTE   Henry May  UJW:[id number] DOB: 01-13-1966 DOA: 02/05/2024 PCP: Tiffany Kocher, DO  Brief Narrative:  Henry May is a 58 y.o. 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  Earlier this week he had his first chemotherapy of FOLFOX.  Reportedly since that time he has gotten progressively weaker with nausea, vomiting, and now presents with acute onset altered mental status 24 hours prior to admission.  Hospitalist called for admission, oncology called in consult.  Assessment & Plan:   Principal Problem:   Acute kidney injury (AKI) with acute tubular necrosis (ATN) (HCC)  Acute metabolic encephalopathy versus toxic encephalopathy, minimally improving  Delirium with active hallucinations -Unclear etiology, concern for polypharmacy versus infectious process -Trial of lactulose with no change in patient's mental status -Home tramadol count concurrent with recent prescription filled, he has not filled/initiated benzodiazepine prescription per family -Treating with broad-spectrum antibiotics to cover pneumonia versus UTI -procalcitonin elevated at intake -MRI and intake negative for acute process -Rule out delirium/hallucinations - sleep cycle difficulties at home -Ativan overnight for agitation x 1, hold further sedatives as family is at bedside to redirect patient -Patient continues to become agitated, he has now pulled out his port multiple times. -Tolerating low-dose olanzapine -follow QT closely (470 this am) - repeat at higher dose in hopes to improve restlessness/agitation/labile mood  Prolonged QT, chronic  -Baseline QTc ranging 470-550 per EKG review over the past few years without history of dysrhythmia. -Stable - continue to follow daily/PRN  Intractable nausea vomiting improving -Likely in the setting of recent  chemotherapy versus known gastric cancer and carcinomatosis -Improving with supportive care -Advance diet as tolerated, tolerating soft diet - single bilious emesis yesterday per wife  AKI, resolved Hyponatremia, hypovolemic, resolved Hyperkalemia, mild, resolved -Previous creatinine within normal limits, over the past 3 weeks patient's creatinine and GFR have been somewhat diminished with minimally elevated BUN(although likely not elevated enough to cause mental status changes as above) -Advance diet as tolerated, DC IV fluids -Lab abnormalities in the setting of poor p.o. intake improving  HF, not in acute exacerbation -Patient appears hypovolemic, elevated BNP is not useful as a single lab -IV fluids off continue to advance p.o. intake  Dysphagia -Questionably new onset versus related to mental status changes as above, speech following, appreciate insight and recommendations -Advance diet as tolerated given improvement in mental status (see above)  Questionable pneumonia, POA -Chest x-ray with questionable bilateral opacities concerning for possible pneumonia versus pulmonary edema or aspiration -continue 5 days of antibiotics -Continue broad-spectrum antibiotics -(transition from azithromycin to doxycycline given QT prolongation), no recent illnesses or sick contacts per wife although procalcitonin is elevated with no other obvious source of infection   Generalized weakness PT OT evaluation pending, likely secondary to above Fall precautions in place given high fall risk   Recently diagnosed 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 Recently dosed with first chemotherapy of FOLFOX. Oncology following, appreciate insight recommendations  DVT prophylaxis: enoxaparin (LOVENOX) injection 40 mg Start: 02/06/24 1000 Code Status:   Code Status: Full Code Family Communication: Wife and daughter bedside  Status is: Inpatient  Dispo: The patient is  from: Home              Anticipated d/c is to: To be determined              Anticipated d/c date is: To be determined  Patient currently not medically stable for discharge  Consultants:  Oncology  Procedures:  None  Antimicrobials:  Azithromycin, ceftriaxone  Subjective: Mental status generally improving, tolerated olanzapine without issue yesterday -continues to have labile emotional state but improving slowly.  Objective: Vitals:   02/08/24 1545 02/08/24 2214 02/09/24 0500 02/09/24 0547  BP: 112/78 (!) 120/92  112/76  Pulse: 79 85  75  Resp: 16 20  20   Temp: 97.9 F (36.6 C) 98.5 F (36.9 C)  98.2 F (36.8 C)  TempSrc: Oral     SpO2: 96% 97%  93%  Weight:   90.4 kg   Height:        Intake/Output Summary (Last 24 hours) at 02/09/2024 0806 Last data filed at 02/09/2024 0600 Gross per 24 hour  Intake 1300.01 ml  Output --  Net 1300.01 ml   Filed Weights   02/06/24 0500 02/08/24 0702 02/09/24 0500  Weight: 91.6 kg 90.6 kg 90.4 kg    Examination:  General:  Pleasantly resting in bed, No acute distress. HEENT:  Normocephalic atraumatic.  Sclerae nonicteric, noninjected.  Extraocular movements intact bilaterally. Neck:  Without mass or deformity.  Trachea is midline. Lungs:  Clear to auscultate bilaterally without rhonchi, wheeze, or rales. Heart:  Regular rate and rhythm.  Without murmurs, rubs, or gallops. Abdomen: Soft, nontender, nondistended without guarding. Extremities: Without cyanosis, clubbing, edema, or obvious deformity. Neuro: Awake alert, oriented to person and family, unable to orient to location or situation.  Able to carry on partial conversations in Spanish but gets frustrated with word finding difficulties Skin:  Warm and dry, no erythema.  Data Reviewed: I have personally reviewed following labs and imaging studies  CBC: Recent Labs  Lab 02/03/24 0825 02/05/24 1514 02/06/24 0635 02/07/24 0315 02/08/24 0826  WBC 16.1* 9.9 9.5  7.1 6.3  NEUTROABS 14.2* 8.9*  --   --   --   HGB 16.8 16.5 15.0 12.8* 13.6  HCT 50.2 49.5 45.9 38.4* 41.8  MCV 89.2 89.4 91.6 91.0 91.5  PLT 317 303 256 218 214   Basic Metabolic Panel: Recent Labs  Lab 02/05/24 1208 02/05/24 1514 02/06/24 0635 02/07/24 0315 02/08/24 0826  NA 132* 129* 130* 134* 139  K 4.7 5.3* 4.5 3.6 3.3*  CL 99 93* 97* 97* 101  CO2 21* 24 19* 25 25  GLUCOSE 155* 147* 127* 108* 109*  BUN 56* 57* 47* 38* 28*  CREATININE 1.41* 1.59* 1.22 1.13 0.94  CALCIUM 7.4* 8.5* 8.1* 7.7* 8.0*  MG  --  2.7* 2.9*  --   --   PHOS  --   --  3.6  --   --    GFR: Estimated Creatinine Clearance: 92.9 mL/min (by C-G formula based on SCr of 0.94 mg/dL).  Home Liver Function Tests: Recent Labs  Lab 02/03/24 0825 02/05/24 1208 02/05/24 1514 02/06/24 0635  AST 22 57* 66* 56*  ALT 20 65* 75* 62*  ALKPHOS 84 71 84 63  BILITOT 0.5 0.4 0.5 1.0  PROT 6.7 5.5* 6.4* 5.7*  ALBUMIN 3.3* 2.9* 3.4* 2.6*   Recent Labs  Lab 02/05/24 1514  LIPASE 39   Recent Labs  Lab 02/05/24 1514  AMMONIA 39*   CBG: Recent Labs  Lab 02/05/24 2302  GLUCAP 146*   Sepsis Labs: Recent Labs  Lab 02/05/24 1934 02/05/24 2138 02/06/24 0635  PROCALCITON  --   --  2.64  LATICACIDVEN 1.4 1.7  --     Recent Results (from the past 240 hours)  Blood culture (routine x 2)     Status: None (Preliminary result)   Collection Time: 02/05/24  6:59 PM   Specimen: BLOOD  Result Value Ref Range Status   Specimen Description   Final    BLOOD Performed at [facility], [address] [place], [place] [address]    Special Requests   Final    NONE Performed at [facility], [address], [place], [place] [address]    Culture   Final    NO GROWTH 4 DAYS Performed at [facility], [address] [place], [place] [address]    Report Status PENDING  Incomplete  Blood culture (routine x 2)     Status: None (Preliminary result)   Collection Time:  02/05/24  7:04 PM   Specimen: BLOOD  Result Value Ref Range Status   Specimen Description   Final    BLOOD BLOOD RIGHT ARM Performed at [facility], [address], [place], [place] [address]    Special Requests   Final    Blood Culture adequate volume BOTTLES DRAWN AEROBIC AND ANAEROBIC Performed at [facility], [address], [place], [place] [address]    Culture   Final    NO GROWTH 4 DAYS Performed at [facility], [address] [place], [place] [address]    Report Status PENDING  Incomplete  Resp panel by RT-PCR (RSV, Flu A&B, Covid) Peripheral     Status: None   Collection Time: 02/05/24  7:34 PM   Specimen: Peripheral; Nasal Swab  Result Value Ref Range Status   SARS Coronavirus 2 by RT PCR NEGATIVE NEGATIVE Final    Comment: (NOTE) SARS-CoV-2 target nucleic acids are NOT DETECTED.  The SARS-CoV-2 RNA is generally detectable in upper respiratory specimens during the acute phase of infection. The lowest concentration of SARS-CoV-2 viral copies this assay can detect is 138 copies/mL. A negative result does not preclude SARS-Cov-2 infection and should not be used as the sole basis for treatment or other patient management decisions. A negative result may occur with  improper specimen collection/handling, submission of specimen other than nasopharyngeal swab, presence of viral mutation(s) within the areas targeted by this assay, and inadequate number of viral copies(<138 copies/mL). A negative result must be combined with clinical observations, patient history, and epidemiological information. The expected result is Negative.  Fact Sheet for Patients:  BloggerCourse.com  Fact Sheet for Healthcare Providers:  SeriousBroker.it  This test is no t yet approved or cleared by the [place] FDA and  has been authorized for detection and/or diagnosis of SARS-CoV-2 by FDA  under an Emergency Use Authorization (EUA). This EUA will remain  in effect (meaning this test can be used) for the duration of the COVID-19 declaration under Section 564(b)(1) of the Act, 21 U.S.C.section 360bbb-3(b)(1), unless the authorization is terminated  or revoked sooner.       Influenza A by PCR NEGATIVE NEGATIVE Final   Influenza B by PCR NEGATIVE NEGATIVE Final    Comment: (NOTE) The Xpert Xpress SARS-CoV-2/FLU/RSV plus assay is intended as an aid in the diagnosis of influenza from Nasopharyngeal swab specimens and should not be used as a sole basis for treatment. Nasal washings and aspirates are unacceptable for Xpert Xpress SARS-CoV-2/FLU/RSV testing.  Fact Sheet for Patients: BloggerCourse.com  Fact Sheet for Healthcare Providers: SeriousBroker.it  This test is not yet approved or cleared by the [place] FDA and has been authorized for detection and/or diagnosis of  SARS-CoV-2 by FDA under an Emergency Use Authorization (EUA). This EUA will remain in effect (meaning this test can be used) for the duration of the COVID-19 declaration under Section 564(b)(1) of the Act, 21 U.S.C. section 360bbb-3(b)(1), unless the authorization is terminated or revoked.     Resp Syncytial Virus by PCR NEGATIVE NEGATIVE Final    Comment: (NOTE) Fact Sheet for Patients: BloggerCourse.com  Fact Sheet for Healthcare Providers: SeriousBroker.it  This test is not yet approved or cleared by the [place] FDA and has been authorized for detection and/or diagnosis of SARS-CoV-2 by FDA under an Emergency Use Authorization (EUA). This EUA will remain in effect (meaning this test can be used) for the duration of the COVID-19 declaration under Section 564(b)(1) of the Act, 21 U.S.C. section 360bbb-3(b)(1), unless the authorization is terminated or revoked.  Performed at [facility], [address] [place], [place] [address]          Radiology Studies: No results found.  Scheduled Meds:  Chlorhexidine Gluconate Cloth  6 each Topical Daily   enoxaparin (LOVENOX) injection  40 mg Subcutaneous Q24H   feeding supplement  1 Container Oral TID BM   magic mouthwash w/lidocaine  10 mL Oral BID   sodium chloride flush  10-40 mL Intracatheter Q12H   Continuous Infusions:  cefTRIAXone (ROCEPHIN)  IV 2 g (02/08/24 1055)   doxycycline (VIBRAMYCIN) IV 100 mg (02/08/24 2026)     LOS: 4 days   Time spent:  Azucena Fallen, DO [facility]  If 7PM-7AM, please contact night-coverage [email or web address]  02/09/2024, 8:06 AM

## 2024-02-09 NOTE — Progress Notes (Signed)
 Pt alert and oriented *1-2 with episodes of confusion. Pt continues to have bedside sitter. Pt coping well with continuous reorientation and distraction. Pt refuses pain. Staffs and family encouraging fluid intake. Pt voiding well. Pt currently resting with call bell in reach. Family and sitter at bedside. Will continue to monitor.

## 2024-02-09 NOTE — Plan of Care (Signed)

## 2024-02-10 DIAGNOSIS — N170 Acute kidney failure with tubular necrosis: Secondary | ICD-10-CM | POA: Diagnosis not present

## 2024-02-10 LAB — CULTURE, BLOOD (ROUTINE X 2)
Culture: NO GROWTH
Culture: NO GROWTH
Special Requests: ADEQUATE

## 2024-02-10 MED ORDER — LORAZEPAM 2 MG/ML IJ SOLN
0.5000 mg | Freq: Once | INTRAMUSCULAR | Status: AC
  Administered 2024-02-10: 0.5 mg via INTRAVENOUS
  Filled 2024-02-10: qty 1

## 2024-02-10 MED ORDER — ALTEPLASE 2 MG IJ SOLR
2.0000 mg | Freq: Once | INTRAMUSCULAR | Status: AC
  Administered 2024-02-10: 2 mg
  Filled 2024-02-10: qty 2

## 2024-02-10 MED ORDER — OLANZAPINE 10 MG PO TABS
10.0000 mg | ORAL_TABLET | Freq: Every day | ORAL | Status: DC
  Administered 2024-02-10 – 2024-02-12 (×3): 10 mg via ORAL
  Filled 2024-02-10 (×4): qty 1

## 2024-02-10 MED ORDER — MAGIC MOUTHWASH
5.0000 mL | Freq: Four times a day (QID) | ORAL | Status: DC | PRN
  Administered 2024-02-11 – 2024-02-13 (×5): 5 mL via ORAL
  Filled 2024-02-10 (×9): qty 5

## 2024-02-10 MED ORDER — HYDROCORTISONE (PERIANAL) 2.5 % EX CREA
TOPICAL_CREAM | Freq: Two times a day (BID) | CUTANEOUS | Status: DC
  Filled 2024-02-10 (×2): qty 28.35

## 2024-02-10 MED ORDER — HYDROCORTISONE ACETATE 25 MG RE SUPP
25.0000 mg | Freq: Two times a day (BID) | RECTAL | Status: AC
  Administered 2024-02-10 – 2024-02-12 (×3): 25 mg via RECTAL
  Filled 2024-02-10 (×6): qty 1

## 2024-02-10 MED ORDER — MELATONIN 5 MG PO TABS
5.0000 mg | ORAL_TABLET | Freq: Every day | ORAL | Status: DC
  Administered 2024-02-10 – 2024-02-12 (×3): 5 mg via ORAL
  Filled 2024-02-10 (×3): qty 1

## 2024-02-10 NOTE — Plan of Care (Signed)
  Problem: Activity: Goal: Risk for activity intolerance will decrease Outcome: Progressing   Problem: Coping: Goal: Level of anxiety will decrease Outcome: Progressing   Problem: Elimination: Goal: Will not experience complications related to urinary retention Outcome: Progressing   Problem: Pain Managment: Goal: General experience of comfort will improve and/or be controlled Outcome: Progressing   Problem: Safety: Goal: Ability to remain free from injury will improve Outcome: Progressing

## 2024-02-10 NOTE — Progress Notes (Signed)
 Labs cancelled per Dr. Natale Milch

## 2024-02-10 NOTE — Progress Notes (Addendum)
 Henry May   DOB:03-28-1966   JX#:[id number]      ASSESSMENT & PLAN:  1.  Altered mental status Acute metabolic encephalopathy - Admitted 02/05/2024 with confusion.  It was noted when he went to outpatient oncology office for chemotherapy pump disconnection that he seemed to be confused.   - MRI of brain done 02/05/2024 showed no evidence of intracranial mets or acute intracranial abnormality. - Unclear etiology at this point, possibility AMS is related to a very rare reaction to fluorouracil (5 FU). - Continue supportive care and IV hydration - Consider Neuro evaluation for persistent altered mental status - Continue to monitor closely   2.  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  CT scans and PET scan subsequently done which showed lymph node involvement. - Decision was made to start him on FOLFOX chemotherapy.  Cycle 1 started 02/03/2024 and pump disconnected 02/05/2024. - Medical oncology/Dr. Truett Perna following patient closely   3.  History of bilateral lower extremity DVT - In 2021 - Anticoagulation was discontinued due to massive retroperitoneal bleed and patient had IVC filter placed 08/24/2020.   4.  Hyponatremia - Electrolyte replacement per Medicine   5.  Transaminitis - Monitor CMP. - T. bili WNL      Code Status Full  Subjective:  Patient seen sleeping comfortably, did not arouse patient.  Discussed with daughter in hallway who is pleased that her father is finally sleeping.  She reports that he is slowly becoming more aware of himself and his surroundings and is embarrassed that people are taking care of him.  No acute distress is noted.  Objective:  Vitals:   02/09/24 2121 02/10/24 0535  BP: 116/83 117/77  Pulse: 85 74  Resp: 17 16  Temp: 98.9 F (37.2 C) (!) 97.5 F (36.4 C)  SpO2: 92% 95%     Intake/Output Summary (Last 24 hours)  at 02/10/2024 1023 Last data filed at 02/10/2024 0600 Gross per 24 hour  Intake 720.02 ml  Output --  Net 720.02 ml     REVIEW OF SYSTEMS: Unable to fully obtain Behavioral/Psych: + Altered mental status  all other systems were reviewed with the patient and are negative.  PHYSICAL EXAMINATION: ECOG PERFORMANCE STATUS: 3 - Symptomatic, >50% confined to bed  Vitals:   02/09/24 2121 02/10/24 0535  BP: 116/83 117/77  Pulse: 85 74  Resp: 17 16  Temp: 98.9 F (37.2 C) (!) 97.5 F (36.4 C)  SpO2: 92% 95%   Filed Weights   02/08/24 0702 02/09/24 0500 02/10/24 0500  Weight: 199 lb 11.8 oz (90.6 kg) 199 lb 4.7 oz (90.4 kg) 201 lb 11.5 oz (91.5 kg)    GENERAL: + Sleeping well, no distress and comfortable SKIN: skin color, texture, turgor are normal, no rashes or significant lesions EYES: normal, conjunctiva are pink and non-injected, sclera clear OROPHARYNX: no exudate, no erythema and lips, buccal mucosa, and tongue normal  NECK: supple, thyroid normal size, non-tender, without nodularity LYMPH: no palpable lymphadenopathy in the cervical, axillary or inguinal LUNGS: clear to auscultation and percussion with normal breathing effort HEART: regular rate & rhythm and no murmurs and no lower extremity edema ABDOMEN: abdomen soft, non-tender and normal bowel sounds MUSCULOSKELETAL: no cyanosis of digits and no clubbing  PSYCH: + Altered mental status  NEURO: no focal motor/sensory deficits   All questions were answered. The patient knows to call the clinic with any problems, questions or  concerns.   The total time spent in the appointment was 40 minutes encounter with patient including review of chart and various tests results, discussions about plan of care and coordination of care plan  Dawson Bills, NP 02/10/2024 10:23 AM    Labs Reviewed:  Lab Results  Component Value Date   WBC 4.3 02/09/2024   HGB 13.9 02/09/2024   HCT 42.5 02/09/2024   MCV 92.6 02/09/2024   PLT 184  02/09/2024   Recent Labs    02/05/24 1514 02/06/24 0635 02/07/24 0315 02/08/24 0826 02/09/24 0807  NA 129* 130* 134* 139 139  K 5.3* 4.5 3.6 3.3* 3.4*  CL 93* 97* 97* 101 104  CO2 24 19* 25 25 21*  GLUCOSE 147* 127* 108* 109* 95  BUN 57* 47* 38* 28* 24*  CREATININE 1.59* 1.22 1.13 0.94 1.13  CALCIUM 8.5* 8.1* 7.7* 8.0* 7.6*  GFRNONAA 50* >60 >60 >60 >60  PROT 6.4* 5.7*  --   --  5.5*  ALBUMIN 3.4* 2.6*  --   --  2.4*  AST 66* 56*  --   --  37  ALT 75* 62*  --   --  33  ALKPHOS 84 63  --   --  52  BILITOT 0.5 1.0  --   --  0.9    Studies Reviewed:  MR Brain W and Wo Contrast Result Date: 02/05/2024 CLINICAL DATA:  Provided history: Mental status change, unknown cause. Additional history provided: History of gastric cancer. EXAM: MRI HEAD WITHOUT AND WITH CONTRAST TECHNIQUE: Multiplanar, multiecho pulse sequences of the brain and surrounding structures were obtained without and with intravenous contrast. CONTRAST:  9mL GADAVIST GADOBUTROL 1 MMOL/ML IV SOLN COMPARISON:  None. FINDINGS: Intermittently motion degraded examination. Most notably, the axial T1-weighted post-contrast sequence is mild-to-moderately motion degraded. Within this limitation, findings are as follows. Brain: Cerebral volume is normal. No cortical encephalomalacia is identified. No significant cerebral white matter disease for age. There is no acute infarct. No evidence of an intracranial mass. No chronic intracranial blood products. No extra-axial fluid collection. No midline shift. No pathologic intracranial enhancement identified. Vascular: Maintained flow voids within the proximal large arterial vessels. 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 Skull and upper cervical spine: No focal worrisome marrow lesion. Sinuses/Orbits: No mass or acute finding within the imaged orbits. 10 mm mucous retention cyst within the left sphenoid sinus. Minimal mucosal thickening within right  ethmoid air cells. IMPRESSION: 1. Intermittently motion degraded examination (most notably, there is mild-to-moderate motion degradation of the axial T1-weighted post-contrast sequence). 2. Within this limitation, no evidence of intracranial metastatic disease or an acute intracranial abnormality. 3. 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 4. Otherwise unremarkable MRI appearance of the brain for age. 5. Mild paranasal sinus disease as described. Electronically Signed   By: Jackey Loge D.O.   On: 02/05/2024 18:56   DG Chest Portable 1 View Result Date: 02/05/2024 CLINICAL DATA:  Weakness. EXAM: PORTABLE CHEST 1 VIEW COMPARISON:  08/22/2020 FINDINGS: Port in the anterior chest wall with tip in distal SVC. Normal cardiac silhouette. Low lung volumes. There is patchy perihilar airspace disease. No pneumothorax. No focal consolidation. IMPRESSION: Patchy perihilar airspace disease suggests pulmonary edema versus multifocal pneumonia. Electronically Signed   By: Genevive Bi M.D.   On: 02/05/2024 15:50   US Paracentesis Result Date: 02/02/2024 INDICATION: 58 year old male with a history of gastric cancer with recurrent malignant ascites. EXAM: ULTRASOUND GUIDED  RIGHT PARACENTESIS MEDICATIONS: 8 mL 1% Lidocaine COMPLICATIONS: None immediate. PROCEDURE: Informed written consent was obtained from the patient after a discussion of the risks, benefits and alternatives to treatment. A timeout was performed prior to the initiation of the procedure. Initial ultrasound scanning demonstrates a large amount of ascites within the right lower abdominal quadrant. The right lower abdomen was prepped and draped in the usual sterile fashion. 1% lidocaine was used for local anesthesia. Following this, a 19 gauge, 7-cm, Yueh catheter was introduced. An ultrasound image was saved for documentation purposes. The paracentesis was performed. The catheter was removed and a dressing was  applied. The patient tolerated the procedure well without immediate post procedural complication. FINDINGS: A total of approximately 4.4 L of clear yellow fluid was removed. Samples were sent to the laboratory as requested by the clinical team. IMPRESSION: Successful ultrasound-guided paracentesis yielding 4.4 liters of peritoneal fluid. PLAN: If the patient eventually requires >/=2 paracenteses in a 30 day period, candidacy for formal evaluation by the [facility] will be assessed. Performed By Theresa Mulligan, PA-C Electronically Signed   By: Corlis Leak M.D.   On: 02/02/2024 10:55   CT ABDOMEN PELVIS WO CONTRAST Result Date: 02/01/2024 CLINICAL DATA:  Abdominal pain, acute, nonlocalized. History of gastric cancer, metastatic. * Tracking Code: BO * EXAM: CT ABDOMEN AND PELVIS WITHOUT CONTRAST TECHNIQUE: Multidetector CT imaging of the abdomen and pelvis was performed following the standard protocol without IV contrast. RADIATION DOSE REDUCTION: This exam was performed according to the departmental dose-optimization program which includes automated exposure control, adjustment of the mA and/or kV according to patient size and/or use of iterative reconstruction technique. COMPARISON:  January 07, 2024. FINDINGS: Evaluation is limited by lack of IV contrast. Lower chest: No acute abnormality. Similar appearance of bibasilar interstitial lung disease. Hepatobiliary: Gallbladder is distended. No extrahepatic biliary ductal dilation. No new focal hepatic abnormality. Pancreas: No new peripancreatic fat stranding. Spleen: Unremarkable. Adrenals/Urinary Tract: Adrenal glands are unremarkable. No hydronephrosis. Nonobstructing bilateral nephrolithiasis. Bladder is unremarkable. Stomach/Bowel: No evidence of bowel obstruction. Similar appearance of gastric wall thickening consistent with known malignancy. Vascular/Lymphatic: No aneurysmal dilation of the abdominal aorta.  IVC filter. Similar shotty lymphadenopathy throughout the upper abdomen. Representative porta hepatic lymph node measures 8 mm in the short axis, previously 5 mm (series 2, image 24). Infiltrative soft tissue throughout the upper abdomen consistent with peritoneal carcinomatosis. Reproductive: Prostate is unremarkable. Other: Moderate volume ascites. Revisualization of interdigitating soft tissue throughout the omentum, favored overall increased since prior PET CT. This is consistent with biopsy-proven peritoneal carcinomatosis. Fat containing inguinal hernias. Musculoskeletal: No acute or significant osseous findings. IMPRESSION: 1. No evidence of bowel obstruction. 2. Similar appearance of gastric wall thickening consistent with known malignancy. 3. Moderate volume ascites with peritoneal carcinomatosis, favored overall increased since prior PET CT. 4. Nonobstructing bilateral nephrolithiasis. Electronically Signed   By: Meda Klinefelter M.D.   On: 02/01/2024 14:02   US Paracentesis Result Date: 01/26/2024 INDICATION: Patient with history of gastric cancer with recurrent malignant ascites. Request received for diagnostic and therapeutic paracentesis up to 4 liters. EXAM: ULTRASOUND GUIDED DIAGNOSTIC AND THERAPEUTIC PARACENTESIS MEDICATIONS: 8 mL 1% lidocaine COMPLICATIONS: None immediate. PROCEDURE: Informed written consent was obtained from the patient after a discussion of the risks, benefits and alternatives to treatment. A timeout was performed prior to the initiation of the procedure. Initial ultrasound scanning demonstrates a moderate amount of ascites within the right lower abdominal quadrant. The right lower abdomen was prepped  and draped in the usual sterile fashion. 1% lidocaine was used for local anesthesia. Following this, a 19 gauge, 7-cm, Yueh catheter was introduced. An ultrasound image was saved for documentation purposes. The paracentesis was performed. The catheter was removed and a dressing  was applied. The patient tolerated the procedure well without immediate post procedural complication. FINDINGS: A total of approximately 3.1 liters of slightly hazy, yellow fluid was removed. Samples were sent to the laboratory as requested by the clinical team. IMPRESSION: Successful ultrasound-guided paracentesis yielding 3.1 liters of peritoneal fluid. Performed by: Artemio Aly Electronically Signed   By: Corlis Leak M.D.   On: 01/26/2024 12:43   IR Paracentesis Result Date: 01/22/2024 INDICATION: 58 year old with gastric cancer and evidence for peritoneal disease. Patient has developed a large volume of ascites EXAM: ULTRASOUND GUIDED DIAGNOSTIC AND THERAPEUTIC PARACENTESIS MEDICATIONS: None. COMPLICATIONS: None immediate. PROCEDURE: Informed written consent was obtained from the patient after a discussion of the risks, benefits and alternatives to treatment. A timeout was performed prior to the initiation of the procedure. Initial ultrasound scanning demonstrates a large amount of ascites within the right upper abdominal quadrant. The right lower abdomen was prepped and draped in the usual sterile fashion. 1% lidocaine was used for local anesthesia. Following this, a 6 Fr Safe-T-Centesis catheter was introduced. An ultrasound image was saved for documentation purposes. The paracentesis was performed. The catheter was removed and a dressing was applied. The patient tolerated the procedure well without immediate post procedural complication. FINDINGS: Largest pocket of fluid was in the right upper abdomen around the liver. A total of approximately 2.9 L of yellow fluid was removed. Samples were sent to the laboratory as requested by the clinical team. IMPRESSION: Successful ultrasound-guided paracentesis yielding 2.9 liters of peritoneal fluid. Electronically Signed   By: Richarda Overlie M.D.   On: 01/22/2024 10:46   IR IMAGING GUIDED PORT INSERTION Result Date: 01/22/2024 INDICATION: 58 year old with gastric  cancer. Patient needs durable venous access for treatment. EXAM: FLUOROSCOPIC AND ULTRASOUND GUIDED PLACEMENT OF A SUBCUTANEOUS PORT COMPARISON:  None Available. MEDICATIONS: Moderate sedation ANESTHESIA/SEDATION: Moderate (conscious) sedation was employed during this procedure. A total of Versed 1 mg and fentanyl 75 mcg was administered intravenously at the order of the provider performing the procedure. Total intra-service moderate sedation time: 26 minutes. Patient's level of consciousness and vital signs were monitored continuously by radiology nurse throughout the procedure under the supervision of the provider performing the procedure. FLUOROSCOPY TIME:  Radiation Exposure Index (as provided by the fluoroscopic device): 4 mGy Kerma COMPLICATIONS: None immediate. PROCEDURE: The procedure, risks, benefits, and alternatives were explained to the patient. Questions regarding the procedure were encouraged and answered. The patient understands and consents to the procedure. Patient was placed supine on the interventional table. Ultrasound confirmed a patent right internal jugular vein. Ultrasound image was saved for documentation. The right chest and neck were cleaned with a skin antiseptic and a sterile drape was placed. Maximal barrier sterile technique was utilized including caps, mask, sterile gowns, sterile gloves, sterile drape, hand hygiene and skin antiseptic. The right neck was anesthetized with 1% lidocaine. Small incision was made in the right neck with a blade. Micropuncture set was placed in the right internal jugular vein with ultrasound guidance. The micropuncture wire was used for measurement purposes. The right chest was anesthetized with 1% lidocaine with epinephrine. #15 blade was used to make an incision and a subcutaneous port pocket was formed. 8 french Power Port was assembled. Subcutaneous tunnel was formed with  a stiff tunneling device. The port catheter was brought through the subcutaneous  tunnel. The port was placed in the subcutaneous pocket. The micropuncture set was exchanged for a peel-away sheath. The catheter was placed through the peel-away sheath and the tip was positioned at the superior cavoatrial junction. Catheter placement was confirmed with fluoroscopy. The port was accessed and flushed with heparinized saline. The port pocket was closed using two layers of absorbable sutures and Dermabond. The vein skin site was closed using a single layer of absorbable suture and Dermabond. Sterile dressings were applied. Patient tolerated the procedure well without an immediate complication. Ultrasound and fluoroscopic images were taken and saved for this procedure. IMPRESSION: Placement of a subcutaneous power-injectable port device. Catheter tip at the superior cavoatrial junction. Electronically Signed   By: Richarda Overlie M.D.   On: 01/22/2024 10:43   CT ABDOMINAL MASS BIOPSY Result Date: 01/22/2024 INDICATION: 58 year old with gastric cancer. Request for peritoneal biopsy to stage the disease. EXAM: CT-GUIDED BIOPSY OF OMENTAL/PERITONEAL THICKENING TECHNIQUE: Multidetector CT imaging of the abdomen was performed following the standard protocol with/without IV contrast. RADIATION DOSE REDUCTION: This exam was performed according to the departmental dose-optimization program which includes automated exposure control, adjustment of the mA and/or kV according to patient size and/or use of iterative reconstruction technique. MEDICATIONS: Moderate sedation ANESTHESIA/SEDATION: Moderate (conscious) sedation was employed during this procedure. A total of Versed 1 mg and Fentanyl 75 mcg was administered intravenously by the radiology nurse. Total intra-service moderate Sedation Time: 15 minutes. The patient's level of consciousness and vital signs were monitored continuously by radiology nursing throughout the procedure under my direct supervision. COMPLICATIONS: None immediate. PROCEDURE: Informed written  consent was obtained from the patient after a thorough discussion of the procedural risks, benefits and alternatives. All questions were addressed. A timeout was performed prior to the initiation of the procedure. CT images through the abdomen were obtained. Extensive omental/peritoneal thickening in the anterior abdomen was identified and targeted for biopsy. The left anterior abdomen was prepped with chlorhexidine and sterile field was created. Skin was anesthetized with 1% lidocaine. Small incision was made. Using CT guidance, a 17 gauge coaxial needle was directed into the left anterior peritoneal thickening. Core biopsies were obtained with an 18 gauge device. Specimens placed in formalin. Needle was removed without complication. Bandage placed over the puncture site. FINDINGS: CT images of the abdomen demonstrate progression of peritoneal disease since 01/07/2024. The patient has now developed increased ascites particularly in the perihepatic region. In addition, there is markedly increased omental/peritoneal thickening in the anterior abdomen. Biopsy needle was directed into the left anterior abdominal peritoneal disease. No immediate bleeding or hematoma formation. IMPRESSION: 1. CT-guided biopsy of omental/peritoneal thickening. 2. Progression of peritoneal disease demonstrated by increased peritoneal thickening and ascites. Electronically Signed   By: Richarda Overlie M.D.   On: 01/22/2024 10:42   US THYROID Result Date: 01/15/2024 CLINICAL DATA:  Incidental on PET. Increased metabolic activity by PET throughout much of the right lobe and possibly extending posterior to the right lobe. EXAM: THYROID ULTRASOUND TECHNIQUE: Ultrasound examination of the thyroid gland and adjacent soft tissues was performed. COMPARISON:  PET scan on 01/07/2024 FINDINGS: Parenchymal Echotexture: Normal Isthmus: 0.7 cm Right lobe: 5.1 x 1.8 x 2.4 cm Left lobe: 4.0 x 1.4 x 1.2 cm  _________________________________________________________ Estimated total number of nodules >/= 1 cm: 1 Number of spongiform nodules >/=  2 cm not described below (TR1): 0 Number of mixed cystic and solid nodules >/= 1.5 cm  not described below (TR2): 0 _________________________________________________________ Nodule # 1: Location: Isthmus Maximum size: 1.1 cm; Other 2 dimensions: 1.1 x 0.7 cm Composition: solid/almost completely solid (2) Echogenicity: hypoechoic (2) Shape: not taller-than-wide (0) Margins: smooth (0) Echogenic foci: none (0) ACR TI-RADS total points: 4. ACR TI-RADS risk category: TR4 (4-6 points). ACR TI-RADS recommendations: *Given size (>/= 1 - 1.4 cm) and appearance, a follow-up ultrasound in 1 year should be considered based on TI-RADS criteria. _________________________________________________________ No abnormal nodule is identified in the right lobe. There is no identifiable abnormal parathyroid soft tissue nodules. No enlarged or abnormal appearing lymph nodes are identified. IMPRESSION: 1. 1.1 cm thyroid isthmus nodule meets criteria for 1 year follow-up ultrasound. 2. No sonographic correlate is identified for the increased metabolic activity within the right lobe or potentially extending posterior to the right lobe. There is no evidence of an obvious parathyroid adenoma by ultrasound. The above is in keeping with the ACR TI-RADS recommendations - J Am Coll Radiol 2017;14:587-595. Electronically Signed   By: Irish Lack M.D.   On: 01/15/2024 13:36  Mr Bisping was interviewed and examined.  His daughter was at the bedside when I saw him this morning.  He continues to have confusion and agitation.  His daughter reports he was able to sleep after receiving Ativan.  He has developed mucositis from chemotherapy.  He has a bleeding hemorrhoid.  The etiology of the altered mental status remains unclear, but I am suspicious this is a rare complication of 5-fluorouracil.  I discussed this  possibility with his daughter.  I recommended continuing supportive care.  We will change to a different systemic therapy regimen when his symptoms have improved.  Recommendations: Continue lorazepam as needed Magic mouthwash for the oral mucositis Hemorrhoid cream Oncology will continue following him in the hospital and outpatient follow-up to be scheduled Cancer Center

## 2024-02-10 NOTE — Progress Notes (Addendum)
 PROGRESS NOTE   Henry May  NWG:[id number] DOB: 07/28/1966 DOA: 02/05/2024 PCP: Tiffany Kocher, DO  Brief Narrative:  Henry May is a 58 y.o. 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  Earlier this week he had his first chemotherapy of FOLFOX.  Reportedly since that time he has gotten progressively weaker with nausea, vomiting, and now presents with acute onset altered mental status 24 hours prior to admission.  Hospitalist called for admission, oncology called in consult.  Assessment & Plan:   Principal Problem:   Acute kidney injury (AKI) with acute tubular necrosis (ATN) (HCC)  Acute metabolic encephalopathy versus toxic encephalopathy, minimally improving  Delirium with active hallucinations -Unclear etiology, concern for polypharmacy versus infectious process -Literature review indicates, that while exceedingly rare, 5-FU psychosis is possible ([email or web address]) ; (https://www.thieme-connect.de/products/ejournals/pdf/10.4103/2278-330X.208850.pdf)  -Defer to oncology for ongoing chemotherapy plans -Trial of lactulose with no change in patient's mental status -Home tramadol count concurrent with recent prescription filled, he has not filled/initiated benzodiazepine prescription per family -Completed broad-spectrum antibiotic course -MRI and intake negative for acute process -Complicated by poor sleep cycle, uptitrate melatonin, olanzapine as tolerated -Ativan utilized overnight occasionally with mixed results -Patient continues to become agitated, he has now pulled out his port multiple times. -Tolerating low-dose olanzapine -follow QT closely (470 today) -increase 10 mg at bedtime in hopes to improve restlessness/agitation/labile mood  Prolonged QT, chronic  -Recent  baseline QTc ranging 470-550 per EKG review over the past few years (as far back as 2021) without history of dysrhythmia. -Stable - continue to follow daily/PRN  Intractable nausea vomiting resolving -Likely in the setting of recent chemotherapy versus known gastric cancer and carcinomatosis -Improving with supportive care -Advance diet as tolerated, tolerating soft diet -no noted emesis over the past 24 hours  AKI, resolved Hyponatremia, hypovolemic, resolved Hyperkalemia, mild, resolved -Previous creatinine within normal limits, over the past 3 weeks patient's creatinine and GFR have been somewhat diminished with minimally elevated BUN(although likely not elevated enough to cause mental status changes as above) -Advance diet as tolerated, DC IV fluids -Lab abnormalities in the setting of poor p.o. intake improving  Hemorrhoid, rectal  -Appears to be acutely inflamed with scant bleeding, offered Anusol(suppository vs cream), unclear if patient will be compliant to take this medication.   -In the interim can offer sitz bath's/supportive care.  HF, not in acute exacerbation -Patient appears hypovolemic, elevated BNP is not useful as a single lab -IV fluids off continue to advance p.o. intake  Dysphagia -Questionably new onset versus related to mental status changes as above, speech following, appreciate insight and recommendations -Advance diet as tolerated given improvement in mental status (see above)  Questionable pneumonia, POA -Chest x-ray with questionable bilateral opacities concerning for possible pneumonia versus pulmonary edema or aspiration -continue 5 days of antibiotics -Continue broad-spectrum antibiotics -(transition from azithromycin to doxycycline given QT prolongation), no recent illnesses or sick contacts per wife although procalcitonin is elevated with no other obvious source of infection   Generalized weakness PT OT evaluation pending, likely secondary to  above Fall precautions in place given high fall risk   Recently diagnosed 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 Discontinuation of FOLFOX per oncology Oncology following, appreciate insight recommendations  DVT prophylaxis: enoxaparin (LOVENOX) injection 40 mg Start: 02/06/24 1000 Code Status:   Code Status: Full Code Family Communication: Wife and daughter bedside  Status is: Inpatient  Dispo: The  patient is from: Home              Anticipated d/c is to: To be determined              Anticipated d/c date is: To be determined              Patient currently not medically stable for discharge  Consultants:  Oncology  Procedures:  None  Antimicrobials:  Azithromycin, ceftriaxone  Subjective: Mental status generally improving, tolerated olanzapine without issue yesterday -continues to have labile emotional state but improving slowly.  Objective: Vitals:   02/09/24 1319 02/09/24 2121 02/10/24 0500 02/10/24 0535  BP: 121/87 116/83  117/77  Pulse: 87 85  74  Resp: 17 17  16   Temp: 97.8 F (36.6 C) 98.9 F (37.2 C)  (!) 97.5 F (36.4 C)  TempSrc: Axillary Oral  Oral  SpO2: 100% 92%  95%  Weight:   91.5 kg   Height:        Intake/Output Summary (Last 24 hours) at 02/10/2024 0726 Last data filed at 02/10/2024 0600 Gross per 24 hour  Intake 1060.02 ml  Output --  Net 1060.02 ml   Filed Weights   02/08/24 0702 02/09/24 0500 02/10/24 0500  Weight: 90.6 kg 90.4 kg 91.5 kg    Examination:  General:  Pleasantly resting in bed, No acute distress.  Resting comfortably today family at bedside HEENT:  Normocephalic atraumatic.  Sclerae nonicteric, noninjected.  Extraocular movements intact bilaterally. Neck:  Without mass or deformity.  Trachea is midline. Lungs:  Clear to auscultate bilaterally without rhonchi, wheeze, or rales. Heart:  Regular rate and rhythm.  Without murmurs, rubs, or gallops. Abdomen: Soft, nontender, nondistended  without guarding. Extremities: Without cyanosis, clubbing, edema, or obvious deformity. Neuro: Awake alert, oriented to person and family, unable to orient to location or situation.  Able to carry on partial conversations in Spanish but gets frustrated with word finding difficulties Skin:  Warm and dry, no erythema.  Data Reviewed: I have personally reviewed following labs and imaging studies  CBC: Recent Labs  Lab 02/03/24 0825 02/05/24 1514 02/06/24 0635 02/07/24 0315 02/08/24 0826 02/09/24 0807  WBC 16.1* 9.9 9.5 7.1 6.3 4.3  NEUTROABS 14.2* 8.9*  --   --   --   --   HGB 16.8 16.5 15.0 12.8* 13.6 13.9  HCT 50.2 49.5 45.9 38.4* 41.8 42.5  MCV 89.2 89.4 91.6 91.0 91.5 92.6  PLT 317 303 256 218 214 184   Basic Metabolic Panel: Recent Labs  Lab 02/05/24 1514 02/06/24 0635 02/07/24 0315 02/08/24 0826 02/09/24 0807  NA 129* 130* 134* 139 139  K 5.3* 4.5 3.6 3.3* 3.4*  CL 93* 97* 97* 101 104  CO2 24 19* 25 25 21*  GLUCOSE 147* 127* 108* 109* 95  BUN 57* 47* 38* 28* 24*  CREATININE 1.59* 1.22 1.13 0.94 1.13  CALCIUM 8.5* 8.1* 7.7* 8.0* 7.6*  MG 2.7* 2.9*  --   --   --   PHOS  --  3.6  --   --   --    GFR: Estimated Creatinine Clearance: 77.3 mL/min (by C-G formula based on SCr of 1.13 mg/dL).  Home Liver Function Tests: Recent Labs  Lab 02/03/24 0825 02/05/24 1208 02/05/24 1514 02/06/24 0635 02/09/24 0807  AST 22 57* 66* 56* 37  ALT 20 65* 75* 62* 33  ALKPHOS 84 71 84 63 52  BILITOT 0.5 0.4 0.5 1.0 0.9  PROT 6.7 5.5* 6.4* 5.7*  5.5*  ALBUMIN 3.3* 2.9* 3.4* 2.6* 2.4*   Recent Labs  Lab 02/05/24 1514  LIPASE 39   Recent Labs  Lab 02/05/24 1514  AMMONIA 39*   CBG: Recent Labs  Lab 02/05/24 2302  GLUCAP 146*   Sepsis Labs: Recent Labs  Lab 02/05/24 1934 02/05/24 2138 02/06/24 0635  PROCALCITON  --   --  2.64  LATICACIDVEN 1.4 1.7  --     Recent Results (from the past 240 hours)  Blood culture (routine x 2)     Status: None   Collection  Time: 02/05/24  6:59 PM   Specimen: BLOOD  Result Value Ref Range Status   Specimen Description   Final    BLOOD Performed at [facility], [address] [place], [place] [address]    Special Requests   Final    NONE Performed at [facility], [address], [place], [place] [address]    Culture   Final    NO GROWTH 5 DAYS Performed at [facility], [address], [place], [place] [address]    Report Status 02/10/2024 FINAL  Final  Blood culture (routine x 2)     Status: None   Collection Time: 02/05/24  7:04 PM   Specimen: BLOOD  Result Value Ref Range Status   Specimen Description   Final    BLOOD BLOOD RIGHT ARM Performed at [facility], [address], [place], [place] [address]    Special Requests   Final    Blood Culture adequate volume BOTTLES DRAWN AEROBIC AND ANAEROBIC Performed at [facility], [address], [place], [place] [address]    Culture   Final    NO GROWTH 5 DAYS Performed at [facility], [address], [place], [place] [address]    Report Status 02/10/2024 FINAL  Final  Resp panel by RT-PCR (RSV, Flu A&B, Covid) Peripheral     Status: None   Collection Time: 02/05/24  7:34 PM   Specimen: Peripheral; Nasal Swab  Result Value Ref Range Status   SARS Coronavirus 2 by RT PCR NEGATIVE NEGATIVE Final    Comment: (NOTE) SARS-CoV-2 target nucleic acids are NOT DETECTED.  The SARS-CoV-2 RNA is generally detectable in upper respiratory specimens during the acute phase of infection. The lowest concentration of SARS-CoV-2 viral copies this assay can detect is 138 copies/mL. A negative result does not preclude SARS-Cov-2 infection and should not be used as the sole basis for treatment or other patient management decisions. A negative result may occur with  improper specimen collection/handling, submission of specimen other than nasopharyngeal swab, presence of  viral mutation(s) within the areas targeted by this assay, and inadequate number of viral copies(<138 copies/mL). A negative result must be combined with clinical observations, patient history, and epidemiological information. The expected result is Negative.  Fact Sheet for Patients:  BloggerCourse.com  Fact Sheet for Healthcare Providers:  SeriousBroker.it  This test is no t yet approved or cleared by the [place] FDA and  has been authorized for detection and/or diagnosis of SARS-CoV-2 by FDA under an Emergency Use Authorization (EUA). This EUA will remain  in effect (meaning this test can be used) for the duration of the COVID-19 declaration under Section 564(b)(1) of the Act, 21 U.S.C.section 360bbb-3(b)(1), unless the authorization is terminated  or revoked sooner.       Influenza A by PCR NEGATIVE NEGATIVE Final   Influenza B by PCR NEGATIVE NEGATIVE Final  Comment: (NOTE) The Xpert Xpress SARS-CoV-2/FLU/RSV plus assay is intended as an aid in the diagnosis of influenza from Nasopharyngeal swab specimens and should not be used as a sole basis for treatment. Nasal washings and aspirates are unacceptable for Xpert Xpress SARS-CoV-2/FLU/RSV testing.  Fact Sheet for Patients: BloggerCourse.com  Fact Sheet for Healthcare Providers: SeriousBroker.it  This test is not yet approved or cleared by the [place] FDA and has been authorized for detection and/or diagnosis of SARS-CoV-2 by FDA under an Emergency Use Authorization (EUA). This EUA will remain in effect (meaning this test can be used) for the duration of the COVID-19 declaration under Section 564(b)(1) of the Act, 21 U.S.C. section 360bbb-3(b)(1), unless the authorization is terminated or revoked.     Resp Syncytial Virus by PCR NEGATIVE NEGATIVE Final    Comment: (NOTE) Fact Sheet for  Patients: BloggerCourse.com  Fact Sheet for Healthcare Providers: SeriousBroker.it  This test is not yet approved or cleared by the [place] FDA and has been authorized for detection and/or diagnosis of SARS-CoV-2 by FDA under an Emergency Use Authorization (EUA). This EUA will remain in effect (meaning this test can be used) for the duration of the COVID-19 declaration under Section 564(b)(1) of the Act, 21 U.S.C. section 360bbb-3(b)(1), unless the authorization is terminated or revoked.  Performed at [facility], [address], [place], [place] [address]      Radiology Studies: No results found.  Scheduled Meds:  Chlorhexidine Gluconate Cloth  6 each Topical Daily   enoxaparin (LOVENOX) injection  40 mg Subcutaneous Q24H   feeding supplement  1 Container Oral TID BM   OLANZapine  7.5 mg Oral QHS   sodium chloride flush  10-40 mL Intracatheter Q12H   Continuous Infusions:  cefTRIAXone (ROCEPHIN)  IV 2 g (02/09/24 1130)   doxycycline (VIBRAMYCIN) IV 100 mg (02/09/24 2114)     LOS: 5 days   Time spent:  Azucena Fallen, DO [facility]  If 7PM-7AM, please contact night-coverage [email or web address]  02/10/2024, 7:26 AM

## 2024-02-10 NOTE — Progress Notes (Signed)
 At bedside for lab draw via PAC. Flushed numerous times  with NBR. Deaccessed and reaccessed with no blood return. MD made aware. Unable to obtain labs at this time. Daughter at bedside and refuses lab stick. States she wants to wait until [facility] is giving blood return. TPA ordered.

## 2024-02-11 DIAGNOSIS — B370 Candidal stomatitis: Secondary | ICD-10-CM | POA: Diagnosis not present

## 2024-02-11 DIAGNOSIS — C169 Malignant neoplasm of stomach, unspecified: Secondary | ICD-10-CM | POA: Diagnosis not present

## 2024-02-11 DIAGNOSIS — R410 Disorientation, unspecified: Secondary | ICD-10-CM | POA: Diagnosis not present

## 2024-02-11 DIAGNOSIS — N170 Acute kidney failure with tubular necrosis: Secondary | ICD-10-CM | POA: Diagnosis not present

## 2024-02-11 LAB — CBC WITH DIFFERENTIAL/PLATELET
Abs Immature Granulocytes: 0.03 10*3/uL (ref 0.00–0.07)
Basophils Absolute: 0 10*3/uL (ref 0.0–0.1)
Basophils Relative: 0 %
Eosinophils Absolute: 0.2 10*3/uL (ref 0.0–0.5)
Eosinophils Relative: 4 %
HCT: 43.2 % (ref 39.0–52.0)
Hemoglobin: 14.1 g/dL (ref 13.0–17.0)
Immature Granulocytes: 1 %
Lymphocytes Relative: 13 %
Lymphs Abs: 0.7 10*3/uL (ref 0.7–4.0)
MCH: 29.8 pg (ref 26.0–34.0)
MCHC: 32.6 g/dL (ref 30.0–36.0)
MCV: 91.3 fL (ref 80.0–100.0)
Monocytes Absolute: 0.2 10*3/uL (ref 0.1–1.0)
Monocytes Relative: 3 %
Neutro Abs: 4.2 10*3/uL (ref 1.7–7.7)
Neutrophils Relative %: 79 %
Platelets: 153 10*3/uL (ref 150–400)
RBC: 4.73 MIL/uL (ref 4.22–5.81)
RDW: 12.8 % (ref 11.5–15.5)
WBC: 5.4 10*3/uL (ref 4.0–10.5)
nRBC: 0 % (ref 0.0–0.2)

## 2024-02-11 MED ORDER — FLUCONAZOLE 40 MG/ML PO SUSR
100.0000 mg | Freq: Every day | ORAL | Status: DC
  Administered 2024-02-11 – 2024-02-13 (×3): 100 mg via ORAL
  Filled 2024-02-11 (×3): qty 2.5

## 2024-02-11 MED ORDER — FLUCONAZOLE 10 MG/ML PO SUSR
100.0000 mg | Freq: Every day | ORAL | Status: DC
  Filled 2024-02-11: qty 10

## 2024-02-11 NOTE — Plan of Care (Signed)

## 2024-02-11 NOTE — Progress Notes (Addendum)
 Henry May   DOB:Sep 27, 1966   XB#:[id number]      ASSESSMENT & PLAN:  1.  Altered mental status Acute metabolic encephalopathy - Improvement noted -  Admitted 02/05/2024 with confusion.  It was noted when he went to outpatient oncology office for chemotherapy pump disconnection that he seemed to be confused.   - MRI of brain done 02/05/2024 showed no evidence of intracranial mets or acute intracranial abnormality. - Unclear etiology, possibility related to very rare reaction to 5 FU. - Continue supportive care and IV hydration - Consider Neuro evaluation for persistent altered mental status - Continue to monitor closely   2.  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  CT scans and PET scan subsequently done which showed lymph node involvement. - Initiated on FOLFOX chemotherapy.  Cycle 1 started 02/03/2024 and pump disconnected 02/05/2024. - Decision made not to rechallenge with 5FU.  Will discuss treatment options with patient and family when recovered.  - Medical oncology/Dr. Truett Perna following patient closely   3.  History of bilateral lower extremity DVT - In 2021 - Anticoagulation was discontinued due to massive retroperitoneal bleed and patient had IVC filter placed 08/24/2020.  4. Oral Thrush - likely due to recent chemotherapy. - administer diflucan, ordered - continue to monitor   5.  Transaminitis - Resolved - Monitor CMP.    Code Status Full  Subjective:  Patient seen sleeping well in room, no distress is noted. Spoke with patient's daughter who states he is becoming more alert and oriented to person and place.  States he does not remember the last several days.  Discussed diflucan for his thrush.  She is asking about further chemotherapy, informed her will be discussed with Dr. Truett Perna when patient has recovered, agreeable to plan.  Objective:   Vitals:   02/10/24 0535 02/10/24 1654  BP: 117/77 128/88  Pulse: 74 73  Resp: 16 16  Temp: (!) 97.5 F (36.4 C) 97.9 F (36.6 C)  SpO2: 95% 97%    No intake or output data in the 24 hours ending 02/11/24 1027   REVIEW OF SYSTEMS:  Unable to fully obtain Behavioral/Psych: +Altered mental status All other systems were reviewed with the patient and are negative.  PHYSICAL EXAMINATION: ECOG PERFORMANCE STATUS: 3 - Symptomatic, >50% confined to bed  Vitals:   02/10/24 0535 02/10/24 1654  BP: 117/77 128/88  Pulse: 74 73  Resp: 16 16  Temp: (!) 97.5 F (36.4 C) 97.9 F (36.6 C)  SpO2: 95% 97%   Filed Weights   02/08/24 0702 02/09/24 0500 02/10/24 0500  Weight: 199 lb 11.8 oz (90.6 kg) 199 lb 4.7 oz (90.4 kg) 201 lb 11.5 oz (91.5 kg)    GENERAL: +resting comfortably SKIN: skin color, texture, turgor are normal, no rashes or significant lesions EYES: normal, conjunctiva are pink and non-injected, sclera clear OROPHARYNX: no exudate, no erythema and lips, buccal mucosa, and tongue normal  NECK: supple, thyroid normal size, non-tender, without nodularity LYMPH: no palpable lymphadenopathy in the cervical, axillary or inguinal LUNGS: clear to auscultation and percussion with normal breathing effort HEART: regular rate & rhythm and no murmurs and no lower extremity edema ABDOMEN: abdomen soft, non-tender and normal bowel sounds MUSCULOSKELETAL: no cyanosis of digits and no clubbing  PSYCH: +altered mentation NEURO: no focal motor/sensory deficits   All questions were answered. The patient knows to call the clinic with any problems, questions or concerns.  The total time spent in the appointment was 40 minutes encounter with patient including review of chart and various tests results, discussions about plan of care and coordination of care plan  Dawson Bills, NP 02/11/2024 9:18 AM    Labs Reviewed:  Lab Results  Component Value Date   WBC 5.4 02/11/2024   HGB 14.1  02/11/2024   HCT 43.2 02/11/2024   MCV 91.3 02/11/2024   PLT 153 02/11/2024   Recent Labs    02/05/24 1514 02/06/24 0635 02/07/24 0315 02/08/24 0826 02/09/24 0807  NA 129* 130* 134* 139 139  K 5.3* 4.5 3.6 3.3* 3.4*  CL 93* 97* 97* 101 104  CO2 24 19* 25 25 21*  GLUCOSE 147* 127* 108* 109* 95  BUN 57* 47* 38* 28* 24*  CREATININE 1.59* 1.22 1.13 0.94 1.13  CALCIUM 8.5* 8.1* 7.7* 8.0* 7.6*  GFRNONAA 50* >60 >60 >60 >60  PROT 6.4* 5.7*  --   --  5.5*  ALBUMIN 3.4* 2.6*  --   --  2.4*  AST 66* 56*  --   --  37  ALT 75* 62*  --   --  33  ALKPHOS 84 63  --   --  52  BILITOT 0.5 1.0  --   --  0.9    Studies Reviewed:  Henry Brain W and Wo Contrast Result Date: 02/05/2024 CLINICAL DATA:  Provided history: Mental status change, unknown cause. Additional history provided: History of gastric cancer. EXAM: MRI HEAD WITHOUT AND WITH CONTRAST TECHNIQUE: Multiplanar, multiecho pulse sequences of the brain and surrounding structures were obtained without and with intravenous contrast. CONTRAST:  9mL GADAVIST GADOBUTROL 1 MMOL/ML IV SOLN COMPARISON:  None. FINDINGS: Intermittently motion degraded examination. Most notably, the axial T1-weighted post-contrast sequence is mild-to-moderately motion degraded. Within this limitation, findings are as follows. Brain: Cerebral volume is normal. No cortical encephalomalacia is identified. No significant cerebral white matter disease for age. There is no acute infarct. No evidence of an intracranial mass. No chronic intracranial blood products. No extra-axial fluid collection. No midline shift. No pathologic intracranial enhancement identified. Vascular: Maintained flow voids within the proximal large arterial vessels. 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 Skull and upper cervical spine: No focal worrisome marrow lesion. Sinuses/Orbits: No mass or acute finding within the imaged orbits. 10 mm mucous retention cyst  within the left sphenoid sinus. Minimal mucosal thickening within right ethmoid air cells. IMPRESSION: 1. Intermittently motion degraded examination (most notably, there is mild-to-moderate motion degradation of the axial T1-weighted post-contrast sequence). 2. Within this limitation, no evidence of intracranial metastatic disease or an acute intracranial abnormality. 3. 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 4. Otherwise unremarkable MRI appearance of the brain for age. 5. Mild paranasal sinus disease as described. Electronically Signed   By: Jackey Loge D.O.   On: 02/05/2024 18:56   DG Chest Portable 1 View Result Date: 02/05/2024 CLINICAL DATA:  Weakness. EXAM: PORTABLE CHEST 1 VIEW COMPARISON:  08/22/2020 FINDINGS: Port in the anterior chest wall with tip in distal SVC. Normal cardiac silhouette. Low lung volumes. There is patchy perihilar airspace disease. No pneumothorax. No focal consolidation. IMPRESSION: Patchy perihilar airspace disease suggests pulmonary edema versus multifocal pneumonia. Electronically Signed   By: Genevive Bi M.D.   On: 02/05/2024 15:50   US Paracentesis Result Date: 02/02/2024 INDICATION: 58 year old male with a history of gastric cancer with recurrent malignant ascites. EXAM: ULTRASOUND GUIDED RIGHT PARACENTESIS MEDICATIONS:  8 mL 1% Lidocaine COMPLICATIONS: None immediate. PROCEDURE: Informed written consent was obtained from the patient after a discussion of the risks, benefits and alternatives to treatment. A timeout was performed prior to the initiation of the procedure. Initial ultrasound scanning demonstrates a large amount of ascites within the right lower abdominal quadrant. The right lower abdomen was prepped and draped in the usual sterile fashion. 1% lidocaine was used for local anesthesia. Following this, a 19 gauge, 7-cm, Yueh catheter was introduced. An ultrasound image was saved for documentation purposes. The  paracentesis was performed. The catheter was removed and a dressing was applied. The patient tolerated the procedure well without immediate post procedural complication. FINDINGS: A total of approximately 4.4 L of clear yellow fluid was removed. Samples were sent to the laboratory as requested by the clinical team. IMPRESSION: Successful ultrasound-guided paracentesis yielding 4.4 liters of peritoneal fluid. PLAN: If the patient eventually requires >/=2 paracenteses in a 30 day period, candidacy for formal evaluation by the [facility] will be assessed. Performed By Theresa Mulligan, PA-C Electronically Signed   By: Corlis Leak M.D.   On: 02/02/2024 10:55   CT ABDOMEN PELVIS WO CONTRAST Result Date: 02/01/2024 CLINICAL DATA:  Abdominal pain, acute, nonlocalized. History of gastric cancer, metastatic. * Tracking Code: BO * EXAM: CT ABDOMEN AND PELVIS WITHOUT CONTRAST TECHNIQUE: Multidetector CT imaging of the abdomen and pelvis was performed following the standard protocol without IV contrast. RADIATION DOSE REDUCTION: This exam was performed according to the departmental dose-optimization program which includes automated exposure control, adjustment of the mA and/or kV according to patient size and/or use of iterative reconstruction technique. COMPARISON:  January 07, 2024. FINDINGS: Evaluation is limited by lack of IV contrast. Lower chest: No acute abnormality. Similar appearance of bibasilar interstitial lung disease. Hepatobiliary: Gallbladder is distended. No extrahepatic biliary ductal dilation. No new focal hepatic abnormality. Pancreas: No new peripancreatic fat stranding. Spleen: Unremarkable. Adrenals/Urinary Tract: Adrenal glands are unremarkable. No hydronephrosis. Nonobstructing bilateral nephrolithiasis. Bladder is unremarkable. Stomach/Bowel: No evidence of bowel obstruction. Similar appearance of gastric wall thickening consistent with known  malignancy. Vascular/Lymphatic: No aneurysmal dilation of the abdominal aorta. IVC filter. Similar shotty lymphadenopathy throughout the upper abdomen. Representative porta hepatic lymph node measures 8 mm in the short axis, previously 5 mm (series 2, image 24). Infiltrative soft tissue throughout the upper abdomen consistent with peritoneal carcinomatosis. Reproductive: Prostate is unremarkable. Other: Moderate volume ascites. Revisualization of interdigitating soft tissue throughout the omentum, favored overall increased since prior PET CT. This is consistent with biopsy-proven peritoneal carcinomatosis. Fat containing inguinal hernias. Musculoskeletal: No acute or significant osseous findings. IMPRESSION: 1. No evidence of bowel obstruction. 2. Similar appearance of gastric wall thickening consistent with known malignancy. 3. Moderate volume ascites with peritoneal carcinomatosis, favored overall increased since prior PET CT. 4. Nonobstructing bilateral nephrolithiasis. Electronically Signed   By: Meda Klinefelter M.D.   On: 02/01/2024 14:02   US Paracentesis Result Date: 01/26/2024 INDICATION: Patient with history of gastric cancer with recurrent malignant ascites. Request received for diagnostic and therapeutic paracentesis up to 4 liters. EXAM: ULTRASOUND GUIDED DIAGNOSTIC AND THERAPEUTIC PARACENTESIS MEDICATIONS: 8 mL 1% lidocaine COMPLICATIONS: None immediate. PROCEDURE: Informed written consent was obtained from the patient after a discussion of the risks, benefits and alternatives to treatment. A timeout was performed prior to the initiation of the procedure. Initial ultrasound scanning demonstrates a moderate amount of ascites within the right lower abdominal quadrant. The right lower abdomen was prepped and draped in  the usual sterile fashion. 1% lidocaine was used for local anesthesia. Following this, a 19 gauge, 7-cm, Yueh catheter was introduced. An ultrasound image was saved for documentation  purposes. The paracentesis was performed. The catheter was removed and a dressing was applied. The patient tolerated the procedure well without immediate post procedural complication. FINDINGS: A total of approximately 3.1 liters of slightly hazy, yellow fluid was removed. Samples were sent to the laboratory as requested by the clinical team. IMPRESSION: Successful ultrasound-guided paracentesis yielding 3.1 liters of peritoneal fluid. Performed by: Artemio Aly Electronically Signed   By: Corlis Leak M.D.   On: 01/26/2024 12:43   IR Paracentesis Result Date: 01/22/2024 INDICATION: 58 year old with gastric cancer and evidence for peritoneal disease. Patient has developed a large volume of ascites EXAM: ULTRASOUND GUIDED DIAGNOSTIC AND THERAPEUTIC PARACENTESIS MEDICATIONS: None. COMPLICATIONS: None immediate. PROCEDURE: Informed written consent was obtained from the patient after a discussion of the risks, benefits and alternatives to treatment. A timeout was performed prior to the initiation of the procedure. Initial ultrasound scanning demonstrates a large amount of ascites within the right upper abdominal quadrant. The right lower abdomen was prepped and draped in the usual sterile fashion. 1% lidocaine was used for local anesthesia. Following this, a 6 Fr Safe-T-Centesis catheter was introduced. An ultrasound image was saved for documentation purposes. The paracentesis was performed. The catheter was removed and a dressing was applied. The patient tolerated the procedure well without immediate post procedural complication. FINDINGS: Largest pocket of fluid was in the right upper abdomen around the liver. A total of approximately 2.9 L of yellow fluid was removed. Samples were sent to the laboratory as requested by the clinical team. IMPRESSION: Successful ultrasound-guided paracentesis yielding 2.9 liters of peritoneal fluid. Electronically Signed   By: Richarda Overlie M.D.   On: 01/22/2024 10:46   IR IMAGING  GUIDED PORT INSERTION Result Date: 01/22/2024 INDICATION: 58 year old with gastric cancer. Patient needs durable venous access for treatment. EXAM: FLUOROSCOPIC AND ULTRASOUND GUIDED PLACEMENT OF A SUBCUTANEOUS PORT COMPARISON:  None Available. MEDICATIONS: Moderate sedation ANESTHESIA/SEDATION: Moderate (conscious) sedation was employed during this procedure. A total of Versed 1 mg and fentanyl 75 mcg was administered intravenously at the order of the provider performing the procedure. Total intra-service moderate sedation time: 26 minutes. Patient's level of consciousness and vital signs were monitored continuously by radiology nurse throughout the procedure under the supervision of the provider performing the procedure. FLUOROSCOPY TIME:  Radiation Exposure Index (as provided by the fluoroscopic device): 4 mGy Kerma COMPLICATIONS: None immediate. PROCEDURE: The procedure, risks, benefits, and alternatives were explained to the patient. Questions regarding the procedure were encouraged and answered. The patient understands and consents to the procedure. Patient was placed supine on the interventional table. Ultrasound confirmed a patent right internal jugular vein. Ultrasound image was saved for documentation. The right chest and neck were cleaned with a skin antiseptic and a sterile drape was placed. Maximal barrier sterile technique was utilized including caps, mask, sterile gowns, sterile gloves, sterile drape, hand hygiene and skin antiseptic. The right neck was anesthetized with 1% lidocaine. Small incision was made in the right neck with a blade. Micropuncture set was placed in the right internal jugular vein with ultrasound guidance. The micropuncture wire was used for measurement purposes. The right chest was anesthetized with 1% lidocaine with epinephrine. #15 blade was used to make an incision and a subcutaneous port pocket was formed. 8 french Power Port was assembled. Subcutaneous tunnel was formed  with a stiff  tunneling device. The port catheter was brought through the subcutaneous tunnel. The port was placed in the subcutaneous pocket. The micropuncture set was exchanged for a peel-away sheath. The catheter was placed through the peel-away sheath and the tip was positioned at the superior cavoatrial junction. Catheter placement was confirmed with fluoroscopy. The port was accessed and flushed with heparinized saline. The port pocket was closed using two layers of absorbable sutures and Dermabond. The vein skin site was closed using a single layer of absorbable suture and Dermabond. Sterile dressings were applied. Patient tolerated the procedure well without an immediate complication. Ultrasound and fluoroscopic images were taken and saved for this procedure. IMPRESSION: Placement of a subcutaneous power-injectable port device. Catheter tip at the superior cavoatrial junction. Electronically Signed   By: Richarda Overlie M.D.   On: 01/22/2024 10:43   CT ABDOMINAL MASS BIOPSY Result Date: 01/22/2024 INDICATION: 58 year old with gastric cancer. Request for peritoneal biopsy to stage the disease. EXAM: CT-GUIDED BIOPSY OF OMENTAL/PERITONEAL THICKENING TECHNIQUE: Multidetector CT imaging of the abdomen was performed following the standard protocol with/without IV contrast. RADIATION DOSE REDUCTION: This exam was performed according to the departmental dose-optimization program which includes automated exposure control, adjustment of the mA and/or kV according to patient size and/or use of iterative reconstruction technique. MEDICATIONS: Moderate sedation ANESTHESIA/SEDATION: Moderate (conscious) sedation was employed during this procedure. A total of Versed 1 mg and Fentanyl 75 mcg was administered intravenously by the radiology nurse. Total intra-service moderate Sedation Time: 15 minutes. The patient's level of consciousness and vital signs were monitored continuously by radiology nursing throughout the procedure  under my direct supervision. COMPLICATIONS: None immediate. PROCEDURE: Informed written consent was obtained from the patient after a thorough discussion of the procedural risks, benefits and alternatives. All questions were addressed. A timeout was performed prior to the initiation of the procedure. CT images through the abdomen were obtained. Extensive omental/peritoneal thickening in the anterior abdomen was identified and targeted for biopsy. The left anterior abdomen was prepped with chlorhexidine and sterile field was created. Skin was anesthetized with 1% lidocaine. Small incision was made. Using CT guidance, a 17 gauge coaxial needle was directed into the left anterior peritoneal thickening. Core biopsies were obtained with an 18 gauge device. Specimens placed in formalin. Needle was removed without complication. Bandage placed over the puncture site. FINDINGS: CT images of the abdomen demonstrate progression of peritoneal disease since 01/07/2024. The patient has now developed increased ascites particularly in the perihepatic region. In addition, there is markedly increased omental/peritoneal thickening in the anterior abdomen. Biopsy needle was directed into the left anterior abdominal peritoneal disease. No immediate bleeding or hematoma formation. IMPRESSION: 1. CT-guided biopsy of omental/peritoneal thickening. 2. Progression of peritoneal disease demonstrated by increased peritoneal thickening and ascites. Electronically Signed   By: Richarda Overlie M.D.   On: 01/22/2024 10:42   US THYROID Result Date: 01/15/2024 CLINICAL DATA:  Incidental on PET. Increased metabolic activity by PET throughout much of the right lobe and possibly extending posterior to the right lobe. EXAM: THYROID ULTRASOUND TECHNIQUE: Ultrasound examination of the thyroid gland and adjacent soft tissues was performed. COMPARISON:  PET scan on 01/07/2024 FINDINGS: Parenchymal Echotexture: Normal Isthmus: 0.7 cm Right lobe: 5.1 x 1.8 x  2.4 cm Left lobe: 4.0 x 1.4 x 1.2 cm _________________________________________________________ Estimated total number of nodules >/= 1 cm: 1 Number of spongiform nodules >/=  2 cm not described below (TR1): 0 Number of mixed cystic and solid nodules >/= 1.5 cm not described below (  TR2): 0 _________________________________________________________ Nodule # 1: Location: Isthmus Maximum size: 1.1 cm; Other 2 dimensions: 1.1 x 0.7 cm Composition: solid/almost completely solid (2) Echogenicity: hypoechoic (2) Shape: not taller-than-wide (0) Margins: smooth (0) Echogenic foci: none (0) ACR TI-RADS total points: 4. ACR TI-RADS risk category: TR4 (4-6 points). ACR TI-RADS recommendations: *Given size (>/= 1 - 1.4 cm) and appearance, a follow-up ultrasound in 1 year should be considered based on TI-RADS criteria. _________________________________________________________ No abnormal nodule is identified in the right lobe. There is no identifiable abnormal parathyroid soft tissue nodules. No enlarged or abnormal appearing lymph nodes are identified. IMPRESSION: 1. 1.1 cm thyroid isthmus nodule meets criteria for 1 year follow-up ultrasound. 2. No sonographic correlate is identified for the increased metabolic activity within the right lobe or potentially extending posterior to the right lobe. There is no evidence of an obvious parathyroid adenoma by ultrasound. The above is in keeping with the ACR TI-RADS recommendations - J Am Coll Radiol 2017;14:587-595. Electronically Signed   By: Irish Lack M.D.   On: 01/15/2024 13:36    Henry May is interviewed and examined.  His daughter was at the bedside when I saw him this morning.  He appeared alert and was more cooperative to examination.  He followed simple commands.  He moves all extremities to command.  He has oral mucositis and oral candidiasis.  The altered mental status appears to be slowly improving.  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  No other cause  for the altered mental status has been identified.  He is now at day 9 following cycle 1 FOLFOX.  He will not be rechallenged with 5-FU.  We will switch to a different systemic therapy regimen.  The white count and platelets remain adequate.  Recommendations: Continue supportive care Mouth care, Diflucan Outpatient follow-up will be scheduled at the Cancer center

## 2024-02-11 NOTE — Progress Notes (Signed)
   02/11/24 0356  Safety Observation   Observer at bedside Yes

## 2024-02-11 NOTE — Progress Notes (Signed)
 [facility]  PROGRESS NOTE  Henry May JYN:[id number] DOB: 1966/04/10 DOA: 02/05/2024 PCP: Tiffany Kocher, DO   Brief HPI:   59 y.o. 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  Earlier this week he had his first chemotherapy of FOLFOX.  Reportedly since that time he has gotten progressively weaker with nausea, vomiting, and now presents with acute onset altered mental status 24 hours prior to admission.  Hospitalist called for admission, oncology called in consult.     Assessment/Plan:   Acute metabolic encephalopathy versus toxic encephalopathy,  Delirium with active hallucinations -Unclear etiology, concern for polypharmacy versus infectious process -Mental status has significantly improved -Trial of lactulose was given -Treated with broad-spectrum antibiotics to cover pneumonia versus UTI -procalcitonin elevated at intake -MRI and intake negative for acute process -Patient was having hallucinations, improved with olanzapine, QTc interval 468   Prolonged QT, chronic  -Baseline QTc ranging 470-550 per EKG review over the past few years without history of dysrhythmia. -Stable QTc interval 468 -   Intractable nausea vomiting improving -Likely in the setting of recent chemotherapy versus known gastric cancer and carcinomatosis -Improving with supportive care -Advance diet as tolerated, tolerating soft diet    AKI, resolved Hyponatremia, hypovolemic, resolved Hyperkalemia, mild, resolved -Previous creatinine within normal limits, over the past 3 weeks patient's creatinine and GFR have been somewhat diminished with minimally elevated BUN(although likely not elevated enough to cause mental status changes as above) -Advance diet as tolerated, DC IV fluids -Lab abnormalities in the setting of poor p.o. intake improving   HF, not in  acute exacerbation -Patient appears hypovolemic, elevated BNP is not useful as a single lab -IV fluids off, continue to advance p.o. 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 Recently dosed with first chemotherapy of FOLFOX. Oncology following, appreciate insight recommendations    Medications     Chlorhexidine Gluconate Cloth  6 each Topical Daily   enoxaparin (LOVENOX) injection  40 mg Subcutaneous Q24H   feeding supplement  1 Container Oral TID BM   fluconazole  100 mg Oral Daily   hydrocortisone   Rectal BID   hydrocortisone  25 mg Rectal BID   melatonin  5 mg Oral QHS   OLANZapine  10 mg Oral QHS   sodium chloride flush  10-40 mL Intracatheter Q12H     Data Reviewed:   CBG:  Recent Labs  Lab 02/05/24 2302  GLUCAP 146*    SpO2: 98 %    Vitals:   02/10/24 0500 02/10/24 0535 02/10/24 1654 02/11/24 1506  BP:  117/77 128/88 (!) 130/91  Pulse:  74 73 79  Resp:  16 16 18   Temp:  (!) 97.5 F (36.4 C) 97.9 F (36.6 C) 98 F (36.7 C)  TempSrc:  Oral Oral Oral  SpO2:  95% 97% 98%  Weight: 91.5 kg     Height:  Data Reviewed:  Basic Metabolic Panel: Recent Labs  Lab 02/05/24 1514 02/06/24 0635 02/07/24 0315 02/08/24 0826 02/09/24 0807  NA 129* 130* 134* 139 139  K 5.3* 4.5 3.6 3.3* 3.4*  CL 93* 97* 97* 101 104  CO2 24 19* 25 25 21*  GLUCOSE 147* 127* 108* 109*  95  BUN 57* 47* 38* 28* 24*  CREATININE 1.59* 1.22 1.13 0.94 1.13  CALCIUM 8.5* 8.1* 7.7* 8.0* 7.6*  MG 2.7* 2.9*  --   --   --   PHOS  --  3.6  --   --   --     CBC: Recent Labs  Lab 02/05/24 1514 02/06/24 0635 02/07/24 0315 02/08/24 0826 02/09/24 0807 02/11/24 0806  WBC 9.9 9.5 7.1 6.3 4.3 5.4  NEUTROABS 8.9*  --   --   --   --  4.2  HGB 16.5 15.0 12.8* 13.6 13.9 14.1  HCT 49.5 45.9 38.4* 41.8 42.5 43.2  MCV 89.4 91.6 91.0 91.5 92.6 91.3  PLT 303 256 218 214 184 153    LFT Recent Labs  Lab 02/05/24 1208 02/05/24 1514 02/06/24 0635 02/09/24 0807  AST 57* 66* 56* 37  ALT 65* 75* 62* 33  ALKPHOS 71 84 63 52  BILITOT 0.4 0.5 1.0 0.9  PROT 5.5* 6.4* 5.7* 5.5*  ALBUMIN 2.9* 3.4* 2.6* 2.4*     Antibiotics: Anti-infectives (From admission, onward)    Start     Dose/Rate Route Frequency Ordered Stop   02/11/24 1010  fluconazole (DIFLUCAN) 40 MG/ML suspension 100 mg       Note to Pharmacy: Please give suspension due to thrush.   100 mg Oral Daily 02/11/24 1011 02/16/24 0959   02/11/24 1000  fluconazole (DIFLUCAN) 10 MG/ML suspension 100 mg  Status:  Discontinued       Note to Pharmacy: Please give suspension due to thrush.   100 mg Oral Daily 02/11/24 0906 02/11/24 1011   02/07/24 1200  doxycycline (VIBRAMYCIN) 100 mg in sodium chloride 0.9 % 250 mL IVPB  Status:  Discontinued        100 mg 125 mL/hr over 120 Minutes Intravenous Every 12 hours 02/07/24 1122 02/10/24 1201   02/06/24 1000  cefTRIAXone (ROCEPHIN) 2 g in sodium chloride 0.9 % 100 mL IVPB  Status:  Discontinued        2 g 200 mL/hr over 30 Minutes Intravenous Every 24 hours 02/05/24 2315 02/10/24 1201   02/06/24 1000  azithromycin (ZITHROMAX) 500 mg in sodium chloride 0.9 % 250 mL IVPB  Status:  Discontinued        500 mg 250 mL/hr over 60 Minutes Intravenous Every 24 hours 02/05/24 2315 02/07/24 1122   02/05/24 1900  cefTRIAXone (ROCEPHIN) 1 g in sodium chloride 0.9 % 100 mL IVPB        1 g 200 mL/hr  over 30 Minutes Intravenous  Once 02/05/24 1859 02/05/24 2100   02/05/24 1900  azithromycin (ZITHROMAX) tablet 500 mg        500 mg Oral  Once 02/05/24 1859 02/05/24 1941        DVT prophylaxis: Lovenox  Code Status: Full code  Family Communication: Discussed with patient's daughter at bedside   CONSULTS oncology   Subjective   Denies any complaints.  Slept well last night.   Objective    Physical Examination:   General-appears in no acute distress Heart-S1-S2, regular, no murmur auscultated Lungs-clear to auscultation bilaterally, no wheezing or crackles auscultated Abdomen-soft, nontender, no  organomegaly Extremities-no edema in the lower extremities Neuro-alert, oriented x3, no focal deficit noted  Status is: Inpatient:             Meredeth Ide   [facility] If 7PM-7AM, please contact night-coverage at [email or web address], Office  [phone]   02/11/2024, 3:13 PM  LOS: 6 days

## 2024-02-12 DIAGNOSIS — R410 Disorientation, unspecified: Secondary | ICD-10-CM | POA: Diagnosis not present

## 2024-02-12 DIAGNOSIS — C169 Malignant neoplasm of stomach, unspecified: Secondary | ICD-10-CM | POA: Diagnosis not present

## 2024-02-12 DIAGNOSIS — N170 Acute kidney failure with tubular necrosis: Secondary | ICD-10-CM | POA: Diagnosis not present

## 2024-02-12 DIAGNOSIS — B370 Candidal stomatitis: Secondary | ICD-10-CM | POA: Diagnosis not present

## 2024-02-12 LAB — COMPREHENSIVE METABOLIC PANEL
ALT: 52 U/L — ABNORMAL HIGH (ref 0–44)
AST: 44 U/L — ABNORMAL HIGH (ref 15–41)
Albumin: 2.3 g/dL — ABNORMAL LOW (ref 3.5–5.0)
Alkaline Phosphatase: 57 U/L (ref 38–126)
Anion gap: 9 (ref 5–15)
BUN: 18 mg/dL (ref 6–20)
CO2: 26 mmol/L (ref 22–32)
Calcium: 7.7 mg/dL — ABNORMAL LOW (ref 8.9–10.3)
Chloride: 106 mmol/L (ref 98–111)
Creatinine, Ser: 0.97 mg/dL (ref 0.61–1.24)
GFR, Estimated: >60 mL/min (ref >60)
Glucose, Bld: 115 mg/dL — ABNORMAL HIGH (ref 70–99)
Potassium: 3.1 mmol/L — ABNORMAL LOW (ref 3.5–5.1)
Sodium: 141 mmol/L (ref 135–145)
Total Bilirubin: 0.8 mg/dL (ref 0.0–1.2)
Total Protein: 5.7 g/dL — ABNORMAL LOW (ref 6.5–8.1)

## 2024-02-12 LAB — AMMONIA: Ammonia: 17 umol/L (ref 9–35)

## 2024-02-12 LAB — CREATININE, SERUM
Creatinine, Ser: 0.98 mg/dL (ref 0.61–1.24)
GFR, Estimated: >60 mL/min (ref >60)

## 2024-02-12 NOTE — Evaluation (Addendum)
 Physical Therapy Evaluation Patient Details Name: Henry May MRN: [id number] DOB: [birth year] Today's Date: 02/12/2024  History of Present Illness  58 y.o. male who,  Earlier this week he had his first chemotherapy of FOLFOX. Reportedly since that time he has gotten progressively weaker with nausea, vomiting, and now presents with acute onset altered mental status 24 hours prior to admission. 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  Clinical Impression  Pt is mobilizing well at a modified independent level, he ambulated 350' with RW without loss of balance. No assistance needed for transfers nor for bed mobility. No family present, and pt is not fully oriented, he couldn't state his birthday, nor state the current year ("2005"), but he could follow 1 step commands consistently. He is mobilizing well enough to DC home without f/u PT.   No further PT indicated, will sign off.      If plan is discharge home, recommend the following: Assist for transportation;Direct supervision/assist for medications management;Direct supervision/assist for financial management;Assistance with cooking/housework   Can travel by private vehicle        Equipment Recommendations Rolling walker (2 wheels)  Recommendations for Other Services       Functional Status Assessment Patient has not had a recent decline in their functional status     Precautions / Restrictions Precautions Precautions: None Restrictions Weight Bearing Restrictions Per Provider Order: No      Mobility  Bed Mobility Overal bed mobility: Modified Independent             General bed mobility comments: HOB up    Transfers                        Ambulation/Gait Ambulation/Gait assistance: Modified independent (Device/Increase time) Gait Distance (Feet): 350 Feet Assistive  device: Rolling walker (2 wheels) Gait Pattern/deviations: WFL(Within Functional Limits) Gait velocity: WNL     General Gait Details: steady, no loss of balance  Stairs            Wheelchair Mobility     Tilt Bed    Modified Rankin (Stroke Patients Only)       Balance Overall balance assessment: Modified Independent                                           Pertinent Vitals/Pain Pain Assessment Pain Assessment: No/denies pain    Home Living Family/patient expects to be discharged to:: Private residence Living Arrangements: Spouse/significant other Available Help at Discharge: Family                    Prior Function Prior Level of Function : Patient poor historian/Family not available                     Extremity/Trunk Assessment   Upper Extremity Assessment Upper Extremity Assessment: Overall WFL for tasks assessed    Lower Extremity Assessment Lower Extremity Assessment: Overall WFL for tasks assessed    Cervical / Trunk Assessment Cervical / Trunk Assessment: Normal  Communication   Communication Communication: No apparent difficulties    Cognition Arousal: Alert Behavior During Therapy: WFL for tasks assessed/performed   PT - Cognitive impairments: Orientation, Memory   Orientation impairments: Time, Situation  PT - Cognition Comments: not able to state his birthdate, not oriented to current year, able to state he's in a hospital Following commands: Intact       Cueing       General Comments      Exercises     Assessment/Plan    PT Assessment Patient does not need any further PT services  PT Problem List         PT Treatment Interventions      PT Goals (Current goals can be found in the Care Plan section)  Acute Rehab PT Goals PT Goal Formulation: All assessment and education complete, DC therapy    Frequency       Co-evaluation               AM-PAC  PT "6 Clicks" Mobility  Outcome Measure Help needed turning from your back to your side while in a flat bed without using bedrails?: None Help needed moving from lying on your back to sitting on the side of a flat bed without using bedrails?: None Help needed moving to and from a bed to a chair (including a wheelchair)?: None Help needed standing up from a chair using your arms (e.g., wheelchair or bedside chair)?: None Help needed to walk in hospital room?: None Help needed climbing 3-5 steps with a railing? : None 6 Click Score: 24    End of Session Equipment Utilized During Treatment: Gait belt Activity Tolerance: Patient tolerated treatment well Patient left: in bed;with bed alarm set Nurse Communication: Mobility status      Time: 1610-9604 PT Time Calculation (min) (ACUTE ONLY): 12 min   Charges:   PT Evaluation $PT Eval Low Complexity: 1 Low   PT General Charges $$ ACUTE PT VISIT: 1 Visit         Tamala Ser PT 02/12/2024  Acute Rehabilitation Services  Office [phone]

## 2024-02-12 NOTE — TOC Progression Note (Signed)
 Transition of Care [facility]) - Progression Note    Patient Details  Name: Henry May MRN: [id number] Date of Birth: November 03, 1966  [facility]) CM/SW Contact  Amada Jupiter, LCSW Phone Number: 02/12/2024, 10:04 AM  Clinical Narrative:     TOC continues to follow along for any anticipated dc needs.     Barriers to Discharge: Continued Medical Work up  Expected Discharge Plan and Services                                               Social Determinants of Health (SDOH) Interventions SDOH Screenings   Food Insecurity: No Food Insecurity (02/05/2024)  Housing: Low Risk  (02/05/2024)  Transportation Needs: No Transportation Needs (02/05/2024)  Utilities: Not At Risk (02/05/2024)  Depression (PHQ2-9): Low Risk  (12/23/2023)  Social Connections: Unknown (04/30/2022)   Received from [facility]  Tobacco Use: Medium Risk (02/05/2024)    Readmission Risk Interventions    02/07/2024    8:40 AM  Readmission Risk Prevention Plan  Transportation Screening Complete  HRI or Home Care Consult Complete  Social Work Consult for Recovery Care Planning/Counseling Complete  Palliative Care Screening Not Applicable  Medication Review Oceanographer) Complete

## 2024-02-12 NOTE — Progress Notes (Addendum)
 Henry May   DOB:1966-11-11   ZH#:[id number]      ASSESSMENT & PLAN:  1.  Altered mental status Acute metabolic encephalopathy - Continues to improve. - Admitted 02/05/2024 with confusion.  It was noted when he went to outpatient oncology office for chemotherapy pump disconnection that he seemed to be confused.   - MRI of brain done 02/05/2024 showed no evidence of intracranial mets or acute intracranial abnormality. - Unclear etiology, possibility related to very rare reaction to 5 FU. - Continue supportive care and IV hydration - Consider Neuro evaluation for persistent altered mental status -Recommend PT OT eval - Continue to monitor closely   2.  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  CT scans and PET scan subsequently done which showed lymph node involvement. - Initiated on FOLFOX chemotherapy.  Cycle 1 started 02/03/2024 and pump disconnected 02/05/2024. - Decision made not to rechallenge with 5FU.  Dr. Truett Perna will discuss treatment options with patient and family when recovered.  - Medical oncology/Dr. Truett Perna following patient closely   3.  History of bilateral lower extremity DVT - In 2021 - Anticoagulation was discontinued due to massive retroperitoneal bleed and patient had IVC filter placed 08/24/2020.   4. Oral Thrush - likely due to recent chemotherapy. -Continue diflucan as ordered - continue to monitor   5.  Transaminitis - Resolved - Monitor CMP.      Code Status Full  Subjective:  Patient seen awake and alert sitting up in bed with wife and daughter at bedside.  Appears much more alert than previous days for which family agrees.  Eating well although complains of sores in his mouth and throat.  Advised to continue taking the Diflucan. No other acute complaints or acute distress noted.  Objective:  Vitals:   02/11/24 2118 02/12/24  0623  BP: 115/75 126/80  Pulse: 78 74  Resp: 17 17  Temp: 98.2 F (36.8 C) 97.9 F (36.6 C)  SpO2: 95% 96%     Intake/Output Summary (Last 24 hours) at 02/12/2024 6295 Last data filed at 02/11/2024 1700 Gross per 24 hour  Intake 480 ml  Output --  Net 480 ml     REVIEW OF SYSTEMS:   Constitutional: +fatigue, Denies fevers, chills or abnormal night sweats Eyes: Denies blurriness of vision, double vision or watery eyes Ears, nose, mouth, throat, and face: Denies mucositis or sore throat Respiratory: Denies cough, dyspnea or wheezes Cardiovascular: Denies palpitation, chest discomfort or lower extremity swelling Gastrointestinal:  Denies nausea, heartburn or change in bowel habits Skin: Denies abnormal skin rashes Lymphatics: Denies new lymphadenopathy or easy bruising Neurological: Denies numbness, tingling or new weaknesses Behavioral/Psych: +AMS All other systems were reviewed with the patient and are negative.  PHYSICAL EXAMINATION: ECOG PERFORMANCE STATUS: 3 - Symptomatic, >50% confined to bed  Vitals:   02/11/24 2118 02/12/24 0623  BP: 115/75 126/80  Pulse: 78 74  Resp: 17 17  Temp: 98.2 F (36.8 C) 97.9 F (36.6 C)  SpO2: 95% 96%   Filed Weights   02/09/24 0500 02/10/24 0500 02/11/24 0701  Weight: 199 lb 4.7 oz (90.4 kg) 201 lb 11.5 oz (91.5 kg) 197 lb 12 oz (89.7 kg)    GENERAL: alert, no distress and comfortable SKIN: skin color, texture, turgor are normal, no rashes or significant lesions EYES: normal, conjunctiva are pink and non-injected, sclera clear OROPHARYNX: Ulcerations over the tongue and palate, thrush appears improved NECK:  supple, thyroid normal size, non-tender, without nodularity LYMPH: no palpable lymphadenopathy in the cervical, axillary or inguinal LUNGS: clear to auscultation and percussion with normal breathing effort HEART: regular rate & rhythm and no murmurs and no lower extremity edema ABDOMEN: abdomen is mildly distended,  nontender, firmness throughout the abdomen without a discrete mass MUSCULOSKELETAL: no cyanosis of digits and no clubbing  PSYCH: + Altered mental status is improving significantly NEURO: no focal motor/sensory deficits   All questions were answered. The patient knows to call the clinic with any problems, questions or concerns.   The total time spent in the appointment was 40 minutes encounter with patient including review of chart and various tests results, discussions about plan of care and coordination of care plan  Dawson Bills, NP 02/12/2024 9:29 AM    Labs Reviewed:  Lab Results  Component Value Date   WBC 5.4 02/11/2024   HGB 14.1 02/11/2024   HCT 43.2 02/11/2024   MCV 91.3 02/11/2024   PLT 153 02/11/2024   Recent Labs    02/05/24 1514 02/06/24 0635 02/07/24 0315 02/08/24 0826 02/09/24 0807  NA 129* 130* 134* 139 139  K 5.3* 4.5 3.6 3.3* 3.4*  CL 93* 97* 97* 101 104  CO2 24 19* 25 25 21*  GLUCOSE 147* 127* 108* 109* 95  BUN 57* 47* 38* 28* 24*  CREATININE 1.59* 1.22 1.13 0.94 1.13  CALCIUM 8.5* 8.1* 7.7* 8.0* 7.6*  GFRNONAA 50* >60 >60 >60 >60  PROT 6.4* 5.7*  --   --  5.5*  ALBUMIN 3.4* 2.6*  --   --  2.4*  AST 66* 56*  --   --  37  ALT 75* 62*  --   --  33  ALKPHOS 84 63  --   --  52  BILITOT 0.5 1.0  --   --  0.9    Studies Reviewed:  MR Brain W and Wo Contrast Result Date: 02/05/2024 CLINICAL DATA:  Provided history: Mental status change, unknown cause. Additional history provided: History of gastric cancer. EXAM: MRI HEAD WITHOUT AND WITH CONTRAST TECHNIQUE: Multiplanar, multiecho pulse sequences of the brain and surrounding structures were obtained without and with intravenous contrast. CONTRAST:  9mL GADAVIST GADOBUTROL 1 MMOL/ML IV SOLN COMPARISON:  None. FINDINGS: Intermittently motion degraded examination. Most notably, the axial T1-weighted post-contrast sequence is mild-to-moderately motion degraded. Within this limitation, findings are as follows.  Brain: Cerebral volume is normal. No cortical encephalomalacia is identified. No significant cerebral white matter disease for age. There is no acute infarct. No evidence of an intracranial mass. No chronic intracranial blood products. No extra-axial fluid collection. No midline shift. No pathologic intracranial enhancement identified. Vascular: Maintained flow voids within the proximal large arterial vessels. 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 Skull and upper cervical spine: No focal worrisome marrow lesion. Sinuses/Orbits: No mass or acute finding within the imaged orbits. 10 mm mucous retention cyst within the left sphenoid sinus. Minimal mucosal thickening within right ethmoid air cells. IMPRESSION: 1. Intermittently motion degraded examination (most notably, there is mild-to-moderate motion degradation of the axial T1-weighted post-contrast sequence). 2. Within this limitation, no evidence of intracranial metastatic disease or an acute intracranial abnormality. 3. 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 4. Otherwise unremarkable MRI appearance of the brain for age. 5. Mild paranasal sinus disease as described. Electronically Signed   By: Jackey Loge D.O.   On: 02/05/2024 18:56   DG Chest Portable 1 View  Result Date: 02/05/2024 CLINICAL DATA:  Weakness. EXAM: PORTABLE CHEST 1 VIEW COMPARISON:  08/22/2020 FINDINGS: Port in the anterior chest wall with tip in distal SVC. Normal cardiac silhouette. Low lung volumes. There is patchy perihilar airspace disease. No pneumothorax. No focal consolidation. IMPRESSION: Patchy perihilar airspace disease suggests pulmonary edema versus multifocal pneumonia. Electronically Signed   By: Genevive Bi M.D.   On: 02/05/2024 15:50   US Paracentesis Result Date: 02/02/2024 INDICATION: 58 year old male with a history of gastric cancer with recurrent malignant ascites. EXAM:  ULTRASOUND GUIDED RIGHT PARACENTESIS MEDICATIONS: 8 mL 1% Lidocaine COMPLICATIONS: None immediate. PROCEDURE: Informed written consent was obtained from the patient after a discussion of the risks, benefits and alternatives to treatment. A timeout was performed prior to the initiation of the procedure. Initial ultrasound scanning demonstrates a large amount of ascites within the right lower abdominal quadrant. The right lower abdomen was prepped and draped in the usual sterile fashion. 1% lidocaine was used for local anesthesia. Following this, a 19 gauge, 7-cm, Yueh catheter was introduced. An ultrasound image was saved for documentation purposes. The paracentesis was performed. The catheter was removed and a dressing was applied. The patient tolerated the procedure well without immediate post procedural complication. FINDINGS: A total of approximately 4.4 L of clear yellow fluid was removed. Samples were sent to the laboratory as requested by the clinical team. IMPRESSION: Successful ultrasound-guided paracentesis yielding 4.4 liters of peritoneal fluid. PLAN: If the patient eventually requires >/=2 paracenteses in a 30 day period, candidacy for formal evaluation by the [facility] will be assessed. Performed By Theresa Mulligan, PA-C Electronically Signed   By: Corlis Leak M.D.   On: 02/02/2024 10:55   CT ABDOMEN PELVIS WO CONTRAST Result Date: 02/01/2024 CLINICAL DATA:  Abdominal pain, acute, nonlocalized. History of gastric cancer, metastatic. * Tracking Code: BO * EXAM: CT ABDOMEN AND PELVIS WITHOUT CONTRAST TECHNIQUE: Multidetector CT imaging of the abdomen and pelvis was performed following the standard protocol without IV contrast. RADIATION DOSE REDUCTION: This exam was performed according to the departmental dose-optimization program which includes automated exposure control, adjustment of the mA and/or kV according to patient size and/or use of  iterative reconstruction technique. COMPARISON:  January 07, 2024. FINDINGS: Evaluation is limited by lack of IV contrast. Lower chest: No acute abnormality. Similar appearance of bibasilar interstitial lung disease. Hepatobiliary: Gallbladder is distended. No extrahepatic biliary ductal dilation. No new focal hepatic abnormality. Pancreas: No new peripancreatic fat stranding. Spleen: Unremarkable. Adrenals/Urinary Tract: Adrenal glands are unremarkable. No hydronephrosis. Nonobstructing bilateral nephrolithiasis. Bladder is unremarkable. Stomach/Bowel: No evidence of bowel obstruction. Similar appearance of gastric wall thickening consistent with known malignancy. Vascular/Lymphatic: No aneurysmal dilation of the abdominal aorta. IVC filter. Similar shotty lymphadenopathy throughout the upper abdomen. Representative porta hepatic lymph node measures 8 mm in the short axis, previously 5 mm (series 2, image 24). Infiltrative soft tissue throughout the upper abdomen consistent with peritoneal carcinomatosis. Reproductive: Prostate is unremarkable. Other: Moderate volume ascites. Revisualization of interdigitating soft tissue throughout the omentum, favored overall increased since prior PET CT. This is consistent with biopsy-proven peritoneal carcinomatosis. Fat containing inguinal hernias. Musculoskeletal: No acute or significant osseous findings. IMPRESSION: 1. No evidence of bowel obstruction. 2. Similar appearance of gastric wall thickening consistent with known malignancy. 3. Moderate volume ascites with peritoneal carcinomatosis, favored overall increased since prior PET CT. 4. Nonobstructing bilateral nephrolithiasis. Electronically Signed   By: Meda Klinefelter M.D.   On: 02/01/2024 14:02  US Paracentesis Result Date: 01/26/2024 INDICATION: Patient with history of gastric cancer with recurrent malignant ascites. Request received for diagnostic and therapeutic paracentesis up to 4 liters. EXAM: ULTRASOUND  GUIDED DIAGNOSTIC AND THERAPEUTIC PARACENTESIS MEDICATIONS: 8 mL 1% lidocaine COMPLICATIONS: None immediate. PROCEDURE: Informed written consent was obtained from the patient after a discussion of the risks, benefits and alternatives to treatment. A timeout was performed prior to the initiation of the procedure. Initial ultrasound scanning demonstrates a moderate amount of ascites within the right lower abdominal quadrant. The right lower abdomen was prepped and draped in the usual sterile fashion. 1% lidocaine was used for local anesthesia. Following this, a 19 gauge, 7-cm, Yueh catheter was introduced. An ultrasound image was saved for documentation purposes. The paracentesis was performed. The catheter was removed and a dressing was applied. The patient tolerated the procedure well without immediate post procedural complication. FINDINGS: A total of approximately 3.1 liters of slightly hazy, yellow fluid was removed. Samples were sent to the laboratory as requested by the clinical team. IMPRESSION: Successful ultrasound-guided paracentesis yielding 3.1 liters of peritoneal fluid. Performed by: Artemio Aly Electronically Signed   By: Corlis Leak M.D.   On: 01/26/2024 12:43   IR Paracentesis Result Date: 01/22/2024 INDICATION: 58 year old with gastric cancer and evidence for peritoneal disease. Patient has developed a large volume of ascites EXAM: ULTRASOUND GUIDED DIAGNOSTIC AND THERAPEUTIC PARACENTESIS MEDICATIONS: None. COMPLICATIONS: None immediate. PROCEDURE: Informed written consent was obtained from the patient after a discussion of the risks, benefits and alternatives to treatment. A timeout was performed prior to the initiation of the procedure. Initial ultrasound scanning demonstrates a large amount of ascites within the right upper abdominal quadrant. The right lower abdomen was prepped and draped in the usual sterile fashion. 1% lidocaine was used for local anesthesia. Following this, a 6 Fr  Safe-T-Centesis catheter was introduced. An ultrasound image was saved for documentation purposes. The paracentesis was performed. The catheter was removed and a dressing was applied. The patient tolerated the procedure well without immediate post procedural complication. FINDINGS: Largest pocket of fluid was in the right upper abdomen around the liver. A total of approximately 2.9 L of yellow fluid was removed. Samples were sent to the laboratory as requested by the clinical team. IMPRESSION: Successful ultrasound-guided paracentesis yielding 2.9 liters of peritoneal fluid. Electronically Signed   By: Richarda Overlie M.D.   On: 01/22/2024 10:46   IR IMAGING GUIDED PORT INSERTION Result Date: 01/22/2024 INDICATION: 58 year old with gastric cancer. Patient needs durable venous access for treatment. EXAM: FLUOROSCOPIC AND ULTRASOUND GUIDED PLACEMENT OF A SUBCUTANEOUS PORT COMPARISON:  None Available. MEDICATIONS: Moderate sedation ANESTHESIA/SEDATION: Moderate (conscious) sedation was employed during this procedure. A total of Versed 1 mg and fentanyl 75 mcg was administered intravenously at the order of the provider performing the procedure. Total intra-service moderate sedation time: 26 minutes. Patient's level of consciousness and vital signs were monitored continuously by radiology nurse throughout the procedure under the supervision of the provider performing the procedure. FLUOROSCOPY TIME:  Radiation Exposure Index (as provided by the fluoroscopic device): 4 mGy Kerma COMPLICATIONS: None immediate. PROCEDURE: The procedure, risks, benefits, and alternatives were explained to the patient. Questions regarding the procedure were encouraged and answered. The patient understands and consents to the procedure. Patient was placed supine on the interventional table. Ultrasound confirmed a patent right internal jugular vein. Ultrasound image was saved for documentation. The right chest and neck were cleaned with a skin  antiseptic and a sterile drape was placed. Maximal  barrier sterile technique was utilized including caps, mask, sterile gowns, sterile gloves, sterile drape, hand hygiene and skin antiseptic. The right neck was anesthetized with 1% lidocaine. Small incision was made in the right neck with a blade. Micropuncture set was placed in the right internal jugular vein with ultrasound guidance. The micropuncture wire was used for measurement purposes. The right chest was anesthetized with 1% lidocaine with epinephrine. #15 blade was used to make an incision and a subcutaneous port pocket was formed. 8 french Power Port was assembled. Subcutaneous tunnel was formed with a stiff tunneling device. The port catheter was brought through the subcutaneous tunnel. The port was placed in the subcutaneous pocket. The micropuncture set was exchanged for a peel-away sheath. The catheter was placed through the peel-away sheath and the tip was positioned at the superior cavoatrial junction. Catheter placement was confirmed with fluoroscopy. The port was accessed and flushed with heparinized saline. The port pocket was closed using two layers of absorbable sutures and Dermabond. The vein skin site was closed using a single layer of absorbable suture and Dermabond. Sterile dressings were applied. Patient tolerated the procedure well without an immediate complication. Ultrasound and fluoroscopic images were taken and saved for this procedure. IMPRESSION: Placement of a subcutaneous power-injectable port device. Catheter tip at the superior cavoatrial junction. Electronically Signed   By: Richarda Overlie M.D.   On: 01/22/2024 10:43   CT ABDOMINAL MASS BIOPSY Result Date: 01/22/2024 INDICATION: 58 year old with gastric cancer. Request for peritoneal biopsy to stage the disease. EXAM: CT-GUIDED BIOPSY OF OMENTAL/PERITONEAL THICKENING TECHNIQUE: Multidetector CT imaging of the abdomen was performed following the standard protocol with/without IV  contrast. RADIATION DOSE REDUCTION: This exam was performed according to the departmental dose-optimization program which includes automated exposure control, adjustment of the mA and/or kV according to patient size and/or use of iterative reconstruction technique. MEDICATIONS: Moderate sedation ANESTHESIA/SEDATION: Moderate (conscious) sedation was employed during this procedure. A total of Versed 1 mg and Fentanyl 75 mcg was administered intravenously by the radiology nurse. Total intra-service moderate Sedation Time: 15 minutes. The patient's level of consciousness and vital signs were monitored continuously by radiology nursing throughout the procedure under my direct supervision. COMPLICATIONS: None immediate. PROCEDURE: Informed written consent was obtained from the patient after a thorough discussion of the procedural risks, benefits and alternatives. All questions were addressed. A timeout was performed prior to the initiation of the procedure. CT images through the abdomen were obtained. Extensive omental/peritoneal thickening in the anterior abdomen was identified and targeted for biopsy. The left anterior abdomen was prepped with chlorhexidine and sterile field was created. Skin was anesthetized with 1% lidocaine. Small incision was made. Using CT guidance, a 17 gauge coaxial needle was directed into the left anterior peritoneal thickening. Core biopsies were obtained with an 18 gauge device. Specimens placed in formalin. Needle was removed without complication. Bandage placed over the puncture site. FINDINGS: CT images of the abdomen demonstrate progression of peritoneal disease since 01/07/2024. The patient has now developed increased ascites particularly in the perihepatic region. In addition, there is markedly increased omental/peritoneal thickening in the anterior abdomen. Biopsy needle was directed into the left anterior abdominal peritoneal disease. No immediate bleeding or hematoma formation.  IMPRESSION: 1. CT-guided biopsy of omental/peritoneal thickening. 2. Progression of peritoneal disease demonstrated by increased peritoneal thickening and ascites. Electronically Signed   By: Richarda Overlie M.D.   On: 01/22/2024 10:42   US THYROID Result Date: 01/15/2024 CLINICAL DATA:  Incidental on PET. Increased metabolic activity  by PET throughout much of the right lobe and possibly extending posterior to the right lobe. EXAM: THYROID ULTRASOUND TECHNIQUE: Ultrasound examination of the thyroid gland and adjacent soft tissues was performed. COMPARISON:  PET scan on 01/07/2024 FINDINGS: Parenchymal Echotexture: Normal Isthmus: 0.7 cm Right lobe: 5.1 x 1.8 x 2.4 cm Left lobe: 4.0 x 1.4 x 1.2 cm _________________________________________________________ Estimated total number of nodules >/= 1 cm: 1 Number of spongiform nodules >/=  2 cm not described below (TR1): 0 Number of mixed cystic and solid nodules >/= 1.5 cm not described below (TR2): 0 _________________________________________________________ Nodule # 1: Location: Isthmus Maximum size: 1.1 cm; Other 2 dimensions: 1.1 x 0.7 cm Composition: solid/almost completely solid (2) Echogenicity: hypoechoic (2) Shape: not taller-than-wide (0) Margins: smooth (0) Echogenic foci: none (0) ACR TI-RADS total points: 4. ACR TI-RADS risk category: TR4 (4-6 points). ACR TI-RADS recommendations: *Given size (>/= 1 - 1.4 cm) and appearance, a follow-up ultrasound in 1 year should be considered based on TI-RADS criteria. _________________________________________________________ No abnormal nodule is identified in the right lobe. There is no identifiable abnormal parathyroid soft tissue nodules. No enlarged or abnormal appearing lymph nodes are identified. IMPRESSION: 1. 1.1 cm thyroid isthmus nodule meets criteria for 1 year follow-up ultrasound. 2. No sonographic correlate is identified for the increased metabolic activity within the right lobe or potentially extending posterior  to the right lobe. There is no evidence of an obvious parathyroid adenoma by ultrasound. The above is in keeping with the ACR TI-RADS recommendations - J Am Coll Radiol 2017;14:587-595. Electronically Signed   By: Irish Lack M.D.   On: 01/15/2024 13:36  Mr Mcfadyen is interviewed and examined.  His daughter was at the bedside when I saw him this morning.  His mental status appeared much improved.  He followed commands.  Oral Candidiasis appears improved.  He continues to have mucositis.  The encephalopathy appears to be improving. He is now day 10 following cycle 1 FOLFOX.  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  Zolbetuximab will be added with future chemotherapy.  Recommendations: Continue Diflucan and Magic mouthwash Advance diet and ambulation as tolerated Home 02/13/2024 if his mental status continues to improve  Outpatient follow-up will be scheduled at the Cancer center for the week of 02/16/2024

## 2024-02-12 NOTE — Progress Notes (Signed)
 [facility]  PROGRESS NOTE  Henry May MVH:[id number] DOB: January 19, 1966 DOA: 02/05/2024 PCP: Tiffany Kocher, DO   Brief HPI:   58 y.o. 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  Earlier this week he had his first chemotherapy of FOLFOX.  Reportedly since that time he has gotten progressively weaker with nausea, vomiting, and now presents with acute onset altered mental status 24 hours prior to admission.  Hospitalist called for admission, oncology called in consult.     Assessment/Plan:   Acute metabolic encephalopathy versus toxic encephalopathy,  Delirium with active hallucinations -Significantly improved -Unclear etiology, concern for polypharmacy versus infectious process -Trial of lactulose was given -Treated with broad-spectrum antibiotics to cover pneumonia versus UTI -procalcitonin elevated  -MRI negative for acute process -Patient was having hallucinations, improved with olanzapine, QTc interval 468   Prolonged QT, chronic  -Baseline QTc ranging 470-550 per EKG review over the past few years without history of dysrhythmia. -Stable QTc interval 468  Hypokalemia -Potassium was 3.4, replaced -Follow serum potassium today  Intractable nausea vomiting improving -Likely in the setting of recent chemotherapy versus known gastric cancer and carcinomatosis -Improving with supportive care -Advance diet as tolerated, tolerating soft diet    AKI, resolved Hyponatremia, hypovolemic, resolved Hyperkalemia, mild, resolved -Previous creatinine within normal limits, over the past 3 weeks patient's creatinine and GFR have been somewhat diminished with minimally elevated BUN(although likely not elevated enough to cause mental status changes as above) -Advance diet as tolerated, DC IV fluids -Lab abnormalities in the setting of poor p.o.  intake improving   HF, not in acute exacerbation -Patient appears hypovolemic, elevated BNP is not useful as a single lab -IV fluids off, continue to advance p.o. 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 Recently dosed with first chemotherapy of FOLFOX. Oncology following, appreciate insight recommendations  Oral ulcers -Secondary to recent chemotherapy -Started on Diflucan 100 mg daily  Medications     Chlorhexidine Gluconate Cloth  6 each Topical Daily   enoxaparin (LOVENOX) injection  40 mg Subcutaneous Q24H   feeding supplement  1 Container Oral TID BM   fluconazole  100 mg Oral Daily   hydrocortisone   Rectal BID   hydrocortisone  25 mg Rectal BID   melatonin  5 mg Oral QHS   OLANZapine  10 mg Oral QHS   sodium chloride flush  10-40 mL Intracatheter Q12H     Data Reviewed:   CBG:  Recent Labs  Lab 02/05/24 2302  GLUCAP 146*    SpO2: 96 %    Vitals:   02/11/24 1506 02/11/24 2118 02/12/24 0623 02/12/24 0706  BP: (!) 130/91 115/75 126/80   Pulse: 79 78 74   Resp: 18 17 17    Temp: 98 F (36.7 C) 98.2 F (36.8 C) 97.9 F (36.6 C)   TempSrc: Oral Oral Oral   SpO2: 98% 95% 96%   Weight:    87.7 kg  Height:          Data Reviewed:  Basic Metabolic Panel: Recent Labs  Lab 02/05/24 1514 02/06/24 0635 02/07/24 0315 02/08/24 0826 02/09/24 0807 02/12/24 0500  NA 129*  130* 134* 139 139  --   K 5.3* 4.5 3.6 3.3* 3.4*  --   CL 93* 97* 97* 101 104  --   CO2 24 19* 25 25 21*  --   GLUCOSE 147* 127* 108* 109* 95  --   BUN 57* 47* 38* 28* 24*  --   CREATININE 1.59* 1.22 1.13 0.94 1.13 0.98  CALCIUM 8.5* 8.1* 7.7* 8.0* 7.6*  --   MG 2.7* 2.9*  --   --   --   --   PHOS  --  3.6  --   --   --   --     CBC: Recent Labs  Lab 02/05/24 1514 02/06/24 0635 02/07/24 0315 02/08/24 0826 02/09/24 0807 02/11/24 0806  WBC 9.9 9.5 7.1 6.3 4.3 5.4  NEUTROABS 8.9*  --   --   --   --  4.2  HGB 16.5 15.0 12.8* 13.6 13.9 14.1  HCT 49.5 45.9 38.4* 41.8 42.5 43.2  MCV 89.4 91.6 91.0 91.5 92.6 91.3  PLT 303 256 218 214 184 153    LFT Recent Labs  Lab 02/05/24 1208 02/05/24 1514 02/06/24 0635 02/09/24 0807  AST 57* 66* 56* 37  ALT 65* 75* 62* 33  ALKPHOS 71 84 63 52  BILITOT 0.4 0.5 1.0 0.9  PROT 5.5* 6.4* 5.7* 5.5*  ALBUMIN 2.9* 3.4* 2.6* 2.4*     Antibiotics: Anti-infectives (From admission, onward)    Start     Dose/Rate Route Frequency Ordered Stop   02/11/24 1010  fluconazole (DIFLUCAN) 40 MG/ML suspension 100 mg       Note to Pharmacy: Please give suspension due to thrush.   100 mg Oral Daily 02/11/24 1011 02/16/24 0959   02/11/24 1000  fluconazole (DIFLUCAN) 10 MG/ML suspension 100 mg  Status:  Discontinued       Note to Pharmacy: Please give suspension due to thrush.   100 mg Oral Daily 02/11/24 0906 02/11/24 1011   02/07/24 1200  doxycycline (VIBRAMYCIN) 100 mg in sodium chloride 0.9 % 250 mL IVPB  Status:  Discontinued        100 mg 125 mL/hr over 120 Minutes Intravenous Every 12 hours 02/07/24 1122 02/10/24 1201   02/06/24 1000  cefTRIAXone (ROCEPHIN) 2 g in sodium chloride 0.9 % 100 mL IVPB  Status:  Discontinued        2 g 200 mL/hr over 30 Minutes Intravenous Every 24 hours 02/05/24 2315 02/10/24 1201   02/06/24 1000  azithromycin (ZITHROMAX) 500 mg in sodium chloride 0.9 % 250 mL IVPB  Status:  Discontinued        500 mg 250 mL/hr  over 60 Minutes Intravenous Every 24 hours 02/05/24 2315 02/07/24 1122   02/05/24 1900  cefTRIAXone (ROCEPHIN) 1 g in sodium chloride 0.9 % 100 mL IVPB        1 g 200 mL/hr over 30 Minutes Intravenous  Once 02/05/24 1859 02/05/24 2100   02/05/24 1900  azithromycin (ZITHROMAX) tablet 500 mg        500 mg Oral  Once 02/05/24 1859 02/05/24 1941        DVT prophylaxis: Lovenox  Code Status: Full code  Family Communication: Discussed with patient's daughter at bedside   CONSULTS oncology   Subjective  Denies any complaints.  Slept well last night.   Objective    Physical Examination:   Appears in no acute distress Mouth-ulcers noted at the dorsum of tongue Lungs clear to auscultation bilaterally Abdomen is soft, nontender, no organomegaly Extremities no edema  Status is: Inpatient:          Meredeth Ide   [facility] If 7PM-7AM, please contact night-coverage at [email or web address], Office  [phone]   02/12/2024, 11:27 AM  LOS: 7 days

## 2024-02-13 ENCOUNTER — Other Ambulatory Visit (HOSPITAL_COMMUNITY): Payer: Self-pay

## 2024-02-13 ENCOUNTER — Encounter: Payer: Self-pay | Admitting: Oncology

## 2024-02-13 DIAGNOSIS — N170 Acute kidney failure with tubular necrosis: Secondary | ICD-10-CM | POA: Diagnosis not present

## 2024-02-13 DIAGNOSIS — B370 Candidal stomatitis: Secondary | ICD-10-CM | POA: Diagnosis not present

## 2024-02-13 DIAGNOSIS — R410 Disorientation, unspecified: Secondary | ICD-10-CM | POA: Diagnosis not present

## 2024-02-13 DIAGNOSIS — N179 Acute kidney failure, unspecified: Secondary | ICD-10-CM | POA: Diagnosis not present

## 2024-02-13 LAB — BASIC METABOLIC PANEL
Anion gap: 10 (ref 5–15)
BUN: 18 mg/dL (ref 6–20)
CO2: 25 mmol/L (ref 22–32)
Calcium: 7.6 mg/dL — ABNORMAL LOW (ref 8.9–10.3)
Chloride: 104 mmol/L (ref 98–111)
Creatinine, Ser: 0.88 mg/dL (ref 0.61–1.24)
GFR, Estimated: >60 mL/min (ref >60)
Glucose, Bld: 105 mg/dL — ABNORMAL HIGH (ref 70–99)
Potassium: 3.2 mmol/L — ABNORMAL LOW (ref 3.5–5.1)
Sodium: 139 mmol/L (ref 135–145)

## 2024-02-13 MED ORDER — FLUCONAZOLE 100 MG PO TABS
100.0000 mg | ORAL_TABLET | Freq: Every day | ORAL | 0 refills | Status: AC
  Filled 2024-02-13: qty 4, 4d supply, fill #0

## 2024-02-13 MED ORDER — HYDROCORTISONE (PERIANAL) 2.5 % EX CREA
TOPICAL_CREAM | Freq: Two times a day (BID) | CUTANEOUS | 0 refills | Status: DC
  Filled 2024-02-13: qty 30, 30d supply, fill #0

## 2024-02-13 MED ORDER — POTASSIUM CHLORIDE 10 MEQ/100ML IV SOLN
10.0000 meq | INTRAVENOUS | Status: AC
Start: 2024-02-13 — End: 2024-02-13
  Administered 2024-02-13 (×2): 10 meq via INTRAVENOUS
  Filled 2024-02-13 (×2): qty 100

## 2024-02-13 MED ORDER — POTASSIUM CHLORIDE 20 MEQ PO PACK
60.0000 meq | PACK | Freq: Once | ORAL | Status: AC
  Administered 2024-02-13: 60 meq via ORAL
  Filled 2024-02-13: qty 3

## 2024-02-13 MED ORDER — HEPARIN SOD (PORK) LOCK FLUSH 100 UNIT/ML IV SOLN
500.0000 [IU] | INTRAVENOUS | Status: AC | PRN
  Administered 2024-02-13: 500 [IU]
  Filled 2024-02-13: qty 5

## 2024-02-13 NOTE — Progress Notes (Addendum)
 Henry May   DOB:September 15, 1966   WU#:[id number]      ASSESSMENT & PLAN:  1.  Altered mental status Acute metabolic encephalopathy - Significantly improving - Admitted 02/05/2024 with confusion.  It was noted when he went to outpatient oncology office for chemotherapy pump disconnection that he seemed to be confused.   - MRI of brain done 02/05/2024 showed no evidence of intracranial mets or acute intracranial abnormality. - Unclear etiology, possibility related to very rare reaction to 5 FU. - Continue supportive care and IV hydration - Consider Neuro evaluation for persistent altered mental status -Recommend PT OT eval - Continue to monitor closely - May discharge home today from oncology viewpoint if mental status continues to improve   2.  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  CT scans and PET scan subsequently done which showed lymph node involvement. - Initiated on FOLFOX chemotherapy.  Cycle 1 started 02/03/2024 and pump disconnected 02/05/2024. - Decision made not to rechallenge with 5FU.  Dr. Truett Perna will discuss treatment options with patient and family when recovered.  - Medical oncology/Dr. Truett Perna following patient closely   3.  History of bilateral lower extremity DVT - In 2021 - Anticoagulation was discontinued due to massive retroperitoneal bleed and patient had IVC filter placed 08/24/2020.   4. Oral Thrush -Improving - likely due to recent chemotherapy. -Continue Diflucan and Magic mouthwash as ordered - Advance diet as tolerated - continue to monitor   5.  Transaminitis -Slight increase in liver enzymes - Continue to monitor CMP    6.  Elevated ammonia level -Elevated ammonia level 39 1 week ago.  Has decreased significantly to 17, now WNL.    Code Status Full  Subjective:  Patient seen awake and alert, actually much more alert than  he has been since admission.  Very pleasant and talkative, expressed appreciation about the care he has received.  Reports he feels much better however feels slightly constipated.  Has been ambulating well with walker and eating well.  Looking forward to going home and asking if he can be discharged today.  Patient's niece is at bedside.  Objective:  Vitals:   02/12/24 2307 02/13/24 0544  BP: 126/72 129/85  Pulse: 72 86  Resp: 20 20  Temp: 98.2 F (36.8 C) 97.9 F (36.6 C)  SpO2: 96% 94%     Intake/Output Summary (Last 24 hours) at 02/13/2024 2956 Last data filed at 02/13/2024 0544 Gross per 24 hour  Intake 360 ml  Output --  Net 360 ml     REVIEW OF SYSTEMS:   Constitutional: Denies fevers, chills or abnormal night sweats Eyes: Denies blurriness of vision, double vision or watery eyes Ears, nose, mouth, throat, and face: Denies mucositis or sore throat Respiratory: Denies cough, dyspnea or wheezes Cardiovascular: Denies palpitation, chest discomfort or lower extremity swelling Gastrointestinal: + Constipated, denies nausea, heartburn or change in bowel habits Skin: Denies abnormal skin rashes Lymphatics: Denies new lymphadenopathy or easy bruising Neurological: Denies numbness, tingling or new weaknesses Behavioral/Psych: + AMS improved All other systems were reviewed with the patient and are negative.  PHYSICAL EXAMINATION: ECOG PERFORMANCE STATUS: 2 - Symptomatic, <50% confined to bed  Vitals:   02/12/24 2307 02/13/24 0544  BP: 126/72 129/85  Pulse: 72 86  Resp: 20 20  Temp: 98.2 F (36.8 C) 97.9 F (36.6 C)  SpO2: 96% 94%   Filed Weights   02/11/24 0701 02/12/24  2130 02/13/24 0500  Weight: 197 lb 12 oz (89.7 kg) 193 lb 5.5 oz (87.7 kg) 187 lb 6.3 oz (85 kg)    GENERAL: alert, no distress and comfortable SKIN: skin color, texture, turgor are normal, no rashes or significant lesions EYES: normal, conjunctiva are pink and non-injected, sclera clear OROPHARYNX:  Ulcerations over the tongue, buccal mucosa, and palate.  No thrush NECK: supple, thyroid normal size, non-tender, without nodularity LYMPH: no palpable lymphadenopathy in the cervical, axillary or inguinal LUNGS: clear to auscultation and percussion with normal breathing effort HEART: regular rate & rhythm and no murmurs and no lower extremity edema ABDOMEN: abdomen soft, non-tender and normal bowel sounds MUSCULOSKELETAL: no cyanosis of digits and no clubbing  PSYCH: + AMS significantly improved  NEURO: no focal motor/sensory deficits, follows commands, oriented to place   All questions were answered. The patient knows to call the clinic with any problems, questions or concerns.   The total time spent in the appointment was 40 minutes encounter with patient including review of chart and various tests results, discussions about plan of care and coordination of care plan  Dawson Bills, NP 02/13/2024 9:27 AM    Labs Reviewed:  Lab Results  Component Value Date   WBC 5.4 02/11/2024   HGB 14.1 02/11/2024   HCT 43.2 02/11/2024   MCV 91.3 02/11/2024   PLT 153 02/11/2024   Recent Labs    02/06/24 0635 02/07/24 0315 02/08/24 0826 02/09/24 0807 02/12/24 0500 02/12/24 1839  NA 130*   < > 139 139  --  141  K 4.5   < > 3.3* 3.4*  --  3.1*  CL 97*   < > 101 104  --  106  CO2 19*   < > 25 21*  --  26  GLUCOSE 127*   < > 109* 95  --  115*  BUN 47*   < > 28* 24*  --  18  CREATININE 1.22   < > 0.94 1.13 0.98 0.97  CALCIUM 8.1*   < > 8.0* 7.6*  --  7.7*  GFRNONAA >60   < > >60 >60 >60 >60  PROT 5.7*  --   --  5.5*  --  5.7*  ALBUMIN 2.6*  --   --  2.4*  --  2.3*  AST 56*  --   --  37  --  44*  ALT 62*  --   --  33  --  52*  ALKPHOS 63  --   --  52  --  57  BILITOT 1.0  --   --  0.9  --  0.8   < > = values in this interval not displayed.    Studies Reviewed:  MR Brain W and Wo Contrast Result Date: 02/05/2024 CLINICAL DATA:  Provided history: Mental status change, unknown cause.  Additional history provided: History of gastric cancer. EXAM: MRI HEAD WITHOUT AND WITH CONTRAST TECHNIQUE: Multiplanar, multiecho pulse sequences of the brain and surrounding structures were obtained without and with intravenous contrast. CONTRAST:  9mL GADAVIST GADOBUTROL 1 MMOL/ML IV SOLN COMPARISON:  None. FINDINGS: Intermittently motion degraded examination. Most notably, the axial T1-weighted post-contrast sequence is mild-to-moderately motion degraded. Within this limitation, findings are as follows. Brain: Cerebral volume is normal. No cortical encephalomalacia is identified. No significant cerebral white matter disease for age. There is no acute infarct. No evidence of an intracranial mass. No chronic intracranial blood products. No extra-axial fluid collection. No midline shift. No pathologic  intracranial enhancement identified. Vascular: Maintained flow voids within the proximal large arterial vessels. 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 Skull and upper cervical spine: No focal worrisome marrow lesion. Sinuses/Orbits: No mass or acute finding within the imaged orbits. 10 mm mucous retention cyst within the left sphenoid sinus. Minimal mucosal thickening within right ethmoid air cells. IMPRESSION: 1. Intermittently motion degraded examination (most notably, there is mild-to-moderate motion degradation of the axial T1-weighted post-contrast sequence). 2. Within this limitation, no evidence of intracranial metastatic disease or an acute intracranial abnormality. 3. 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 4. Otherwise unremarkable MRI appearance of the brain for age. 5. Mild paranasal sinus disease as described. Electronically Signed   By: Jackey Loge D.O.   On: 02/05/2024 18:56   DG Chest Portable 1 View Result Date: 02/05/2024 CLINICAL DATA:  Weakness. EXAM: PORTABLE CHEST 1 VIEW COMPARISON:  08/22/2020 FINDINGS:  Port in the anterior chest wall with tip in distal SVC. Normal cardiac silhouette. Low lung volumes. There is patchy perihilar airspace disease. No pneumothorax. No focal consolidation. IMPRESSION: Patchy perihilar airspace disease suggests pulmonary edema versus multifocal pneumonia. Electronically Signed   By: Genevive Bi M.D.   On: 02/05/2024 15:50   US Paracentesis Result Date: 02/02/2024 INDICATION: 58 year old male with a history of gastric cancer with recurrent malignant ascites. EXAM: ULTRASOUND GUIDED RIGHT PARACENTESIS MEDICATIONS: 8 mL 1% Lidocaine COMPLICATIONS: None immediate. PROCEDURE: Informed written consent was obtained from the patient after a discussion of the risks, benefits and alternatives to treatment. A timeout was performed prior to the initiation of the procedure. Initial ultrasound scanning demonstrates a large amount of ascites within the right lower abdominal quadrant. The right lower abdomen was prepped and draped in the usual sterile fashion. 1% lidocaine was used for local anesthesia. Following this, a 19 gauge, 7-cm, Yueh catheter was introduced. An ultrasound image was saved for documentation purposes. The paracentesis was performed. The catheter was removed and a dressing was applied. The patient tolerated the procedure well without immediate post procedural complication. FINDINGS: A total of approximately 4.4 L of clear yellow fluid was removed. Samples were sent to the laboratory as requested by the clinical team. IMPRESSION: Successful ultrasound-guided paracentesis yielding 4.4 liters of peritoneal fluid. PLAN: If the patient eventually requires >/=2 paracenteses in a 30 day period, candidacy for formal evaluation by the [facility] will be assessed. Performed By Theresa Mulligan, PA-C Electronically Signed   By: Corlis Leak M.D.   On: 02/02/2024 10:55   CT ABDOMEN PELVIS WO CONTRAST Result Date: 02/01/2024 CLINICAL  DATA:  Abdominal pain, acute, nonlocalized. History of gastric cancer, metastatic. * Tracking Code: BO * EXAM: CT ABDOMEN AND PELVIS WITHOUT CONTRAST TECHNIQUE: Multidetector CT imaging of the abdomen and pelvis was performed following the standard protocol without IV contrast. RADIATION DOSE REDUCTION: This exam was performed according to the departmental dose-optimization program which includes automated exposure control, adjustment of the mA and/or kV according to patient size and/or use of iterative reconstruction technique. COMPARISON:  January 07, 2024. FINDINGS: Evaluation is limited by lack of IV contrast. Lower chest: No acute abnormality. Similar appearance of bibasilar interstitial lung disease. Hepatobiliary: Gallbladder is distended. No extrahepatic biliary ductal dilation. No new focal hepatic abnormality. Pancreas: No new peripancreatic fat stranding. Spleen: Unremarkable. Adrenals/Urinary Tract: Adrenal glands are unremarkable. No hydronephrosis. Nonobstructing bilateral nephrolithiasis. Bladder is unremarkable. Stomach/Bowel: No evidence of bowel obstruction. Similar appearance of gastric wall thickening  consistent with known malignancy. Vascular/Lymphatic: No aneurysmal dilation of the abdominal aorta. IVC filter. Similar shotty lymphadenopathy throughout the upper abdomen. Representative porta hepatic lymph node measures 8 mm in the short axis, previously 5 mm (series 2, image 24). Infiltrative soft tissue throughout the upper abdomen consistent with peritoneal carcinomatosis. Reproductive: Prostate is unremarkable. Other: Moderate volume ascites. Revisualization of interdigitating soft tissue throughout the omentum, favored overall increased since prior PET CT. This is consistent with biopsy-proven peritoneal carcinomatosis. Fat containing inguinal hernias. Musculoskeletal: No acute or significant osseous findings. IMPRESSION: 1. No evidence of bowel obstruction. 2. Similar appearance of gastric  wall thickening consistent with known malignancy. 3. Moderate volume ascites with peritoneal carcinomatosis, favored overall increased since prior PET CT. 4. Nonobstructing bilateral nephrolithiasis. Electronically Signed   By: Meda Klinefelter M.D.   On: 02/01/2024 14:02   US Paracentesis Result Date: 01/26/2024 INDICATION: Patient with history of gastric cancer with recurrent malignant ascites. Request received for diagnostic and therapeutic paracentesis up to 4 liters. EXAM: ULTRASOUND GUIDED DIAGNOSTIC AND THERAPEUTIC PARACENTESIS MEDICATIONS: 8 mL 1% lidocaine COMPLICATIONS: None immediate. PROCEDURE: Informed written consent was obtained from the patient after a discussion of the risks, benefits and alternatives to treatment. A timeout was performed prior to the initiation of the procedure. Initial ultrasound scanning demonstrates a moderate amount of ascites within the right lower abdominal quadrant. The right lower abdomen was prepped and draped in the usual sterile fashion. 1% lidocaine was used for local anesthesia. Following this, a 19 gauge, 7-cm, Yueh catheter was introduced. An ultrasound image was saved for documentation purposes. The paracentesis was performed. The catheter was removed and a dressing was applied. The patient tolerated the procedure well without immediate post procedural complication. FINDINGS: A total of approximately 3.1 liters of slightly hazy, yellow fluid was removed. Samples were sent to the laboratory as requested by the clinical team. IMPRESSION: Successful ultrasound-guided paracentesis yielding 3.1 liters of peritoneal fluid. Performed by: Artemio Aly Electronically Signed   By: Corlis Leak M.D.   On: 01/26/2024 12:43   IR Paracentesis Result Date: 01/22/2024 INDICATION: 58 year old with gastric cancer and evidence for peritoneal disease. Patient has developed a large volume of ascites EXAM: ULTRASOUND GUIDED DIAGNOSTIC AND THERAPEUTIC PARACENTESIS MEDICATIONS:  None. COMPLICATIONS: None immediate. PROCEDURE: Informed written consent was obtained from the patient after a discussion of the risks, benefits and alternatives to treatment. A timeout was performed prior to the initiation of the procedure. Initial ultrasound scanning demonstrates a large amount of ascites within the right upper abdominal quadrant. The right lower abdomen was prepped and draped in the usual sterile fashion. 1% lidocaine was used for local anesthesia. Following this, a 6 Fr Safe-T-Centesis catheter was introduced. An ultrasound image was saved for documentation purposes. The paracentesis was performed. The catheter was removed and a dressing was applied. The patient tolerated the procedure well without immediate post procedural complication. FINDINGS: Largest pocket of fluid was in the right upper abdomen around the liver. A total of approximately 2.9 L of yellow fluid was removed. Samples were sent to the laboratory as requested by the clinical team. IMPRESSION: Successful ultrasound-guided paracentesis yielding 2.9 liters of peritoneal fluid. Electronically Signed   By: Richarda Overlie M.D.   On: 01/22/2024 10:46   IR IMAGING GUIDED PORT INSERTION Result Date: 01/22/2024 INDICATION: 58 year old with gastric cancer. Patient needs durable venous access for treatment. EXAM: FLUOROSCOPIC AND ULTRASOUND GUIDED PLACEMENT OF A SUBCUTANEOUS PORT COMPARISON:  None Available. MEDICATIONS: Moderate sedation ANESTHESIA/SEDATION: Moderate (conscious) sedation was employed  during this procedure. A total of Versed 1 mg and fentanyl 75 mcg was administered intravenously at the order of the provider performing the procedure. Total intra-service moderate sedation time: 26 minutes. Patient's level of consciousness and vital signs were monitored continuously by radiology nurse throughout the procedure under the supervision of the provider performing the procedure. FLUOROSCOPY TIME:  Radiation Exposure Index (as provided  by the fluoroscopic device): 4 mGy Kerma COMPLICATIONS: None immediate. PROCEDURE: The procedure, risks, benefits, and alternatives were explained to the patient. Questions regarding the procedure were encouraged and answered. The patient understands and consents to the procedure. Patient was placed supine on the interventional table. Ultrasound confirmed a patent right internal jugular vein. Ultrasound image was saved for documentation. The right chest and neck were cleaned with a skin antiseptic and a sterile drape was placed. Maximal barrier sterile technique was utilized including caps, mask, sterile gowns, sterile gloves, sterile drape, hand hygiene and skin antiseptic. The right neck was anesthetized with 1% lidocaine. Small incision was made in the right neck with a blade. Micropuncture set was placed in the right internal jugular vein with ultrasound guidance. The micropuncture wire was used for measurement purposes. The right chest was anesthetized with 1% lidocaine with epinephrine. #15 blade was used to make an incision and a subcutaneous port pocket was formed. 8 french Power Port was assembled. Subcutaneous tunnel was formed with a stiff tunneling device. The port catheter was brought through the subcutaneous tunnel. The port was placed in the subcutaneous pocket. The micropuncture set was exchanged for a peel-away sheath. The catheter was placed through the peel-away sheath and the tip was positioned at the superior cavoatrial junction. Catheter placement was confirmed with fluoroscopy. The port was accessed and flushed with heparinized saline. The port pocket was closed using two layers of absorbable sutures and Dermabond. The vein skin site was closed using a single layer of absorbable suture and Dermabond. Sterile dressings were applied. Patient tolerated the procedure well without an immediate complication. Ultrasound and fluoroscopic images were taken and saved for this procedure. IMPRESSION:  Placement of a subcutaneous power-injectable port device. Catheter tip at the superior cavoatrial junction. Electronically Signed   By: Richarda Overlie M.D.   On: 01/22/2024 10:43   CT ABDOMINAL MASS BIOPSY Result Date: 01/22/2024 INDICATION: 58 year old with gastric cancer. Request for peritoneal biopsy to stage the disease. EXAM: CT-GUIDED BIOPSY OF OMENTAL/PERITONEAL THICKENING TECHNIQUE: Multidetector CT imaging of the abdomen was performed following the standard protocol with/without IV contrast. RADIATION DOSE REDUCTION: This exam was performed according to the departmental dose-optimization program which includes automated exposure control, adjustment of the mA and/or kV according to patient size and/or use of iterative reconstruction technique. MEDICATIONS: Moderate sedation ANESTHESIA/SEDATION: Moderate (conscious) sedation was employed during this procedure. A total of Versed 1 mg and Fentanyl 75 mcg was administered intravenously by the radiology nurse. Total intra-service moderate Sedation Time: 15 minutes. The patient's level of consciousness and vital signs were monitored continuously by radiology nursing throughout the procedure under my direct supervision. COMPLICATIONS: None immediate. PROCEDURE: Informed written consent was obtained from the patient after a thorough discussion of the procedural risks, benefits and alternatives. All questions were addressed. A timeout was performed prior to the initiation of the procedure. CT images through the abdomen were obtained. Extensive omental/peritoneal thickening in the anterior abdomen was identified and targeted for biopsy. The left anterior abdomen was prepped with chlorhexidine and sterile field was created. Skin was anesthetized with 1% lidocaine. Small incision was made.  Using CT guidance, a 17 gauge coaxial needle was directed into the left anterior peritoneal thickening. Core biopsies were obtained with an 18 gauge device. Specimens placed in  formalin. Needle was removed without complication. Bandage placed over the puncture site. FINDINGS: CT images of the abdomen demonstrate progression of peritoneal disease since 01/07/2024. The patient has now developed increased ascites particularly in the perihepatic region. In addition, there is markedly increased omental/peritoneal thickening in the anterior abdomen. Biopsy needle was directed into the left anterior abdominal peritoneal disease. No immediate bleeding or hematoma formation. IMPRESSION: 1. CT-guided biopsy of omental/peritoneal thickening. 2. Progression of peritoneal disease demonstrated by increased peritoneal thickening and ascites. Electronically Signed   By: Richarda Overlie M.D.   On: 01/22/2024 10:42   Mr Blancett was interviewed and examined.  His mental status continues to improve.  He does not appear at baseline, but he is much better over the past few days.  He was alert and oriented when I saw him this morning.  He followed commands.  He wants to go home.  Oral mucositis appears to be resolving.  Recommendations: Discharge to home if mental status remains improved, eating, and ambulatory today Magic mouthwash Discontinue Zyprexa Outpatient follow-up at the cancer center on 02/16/2024

## 2024-02-13 NOTE — Progress Notes (Signed)
 Meds delivered from [facility] outpatient pharmacy by this RN

## 2024-02-13 NOTE — Progress Notes (Incomplete)
[facility]  PROGRESS NOTE  Henry May WUJ:[id number] DOB: 1966-06-09 DOA: 02/05/2024 PCP: Tiffany Kocher, DO   Brief HPI:   58 y.o. 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  Earlier this week he had his first chemotherapy of FOLFOX.  Reportedly since that time he has gotten progressively weaker with nausea, vomiting, and now presents with acute onset altered mental status 24 hours prior to admission.  Hospitalist called for admission, oncology called in consult.     Assessment/Plan:   Acute metabolic encephalopathy versus toxic encephalopathy,  Delirium with active hallucinations -Significantly improved -Unclear etiology, concern for polypharmacy versus infectious process -Trial of lactulose was given -Treated with broad-spectrum antibiotics to cover pneumonia versus UTI -procalcitonin elevated  -MRI negative for acute process -Patient was having hallucinations, improved with olanzapine, QTc interval 468   Prolonged QT, chronic  -Baseline QTc ranging 470-550 per EKG review over the past few years without history of dysrhythmia. -Stable QTc interval 468  Hypokalemia -Potassium was 3.4, replaced -Follow serum potassium today  Intractable nausea vomiting improving -Likely in the setting of recent chemotherapy versus known gastric cancer and carcinomatosis -Improving with supportive care -Advance diet as tolerated, tolerating soft diet    AKI, resolved Hyponatremia, hypovolemic, resolved Hyperkalemia, mild, resolved -Previous creatinine within normal limits, over the past 3 weeks patient's creatinine and GFR have been somewhat diminished with minimally elevated BUN(although likely not elevated enough to cause mental status changes as above) -Advance diet as tolerated, DC IV fluids -Lab abnormalities in the setting of poor p.o.  intake improving   HF, not in acute exacerbation -Patient appears hypovolemic, elevated BNP is not useful as a single lab -IV fluids off, continue to advance p.o. 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 Recently dosed with first chemotherapy of FOLFOX. Oncology following, appreciate insight recommendations  Oral ulcers -Secondary to recent chemotherapy -Started on Diflucan 100 mg daily  Medications     Chlorhexidine Gluconate Cloth  6 each Topical Daily   enoxaparin (LOVENOX) injection  40 mg Subcutaneous Q24H   feeding supplement  1 Container Oral TID BM   fluconazole  100 mg Oral Daily   hydrocortisone   Rectal BID   hydrocortisone  25 mg Rectal BID   melatonin  5 mg Oral QHS   OLANZapine  10 mg Oral QHS   sodium chloride flush  10-40 mL Intracatheter Q12H     Data Reviewed:   CBG:  No results for input(s): "GLUCAP" in the last 168 hours.   SpO2: 94 %    Vitals:   02/12/24 1413 02/12/24 2307 02/13/24 0500 02/13/24 0544  BP: 134/82 126/72  129/85  Pulse: 66 72  86  Resp: 18 20  20   Temp: 98.1 F (36.7 C) 98.2 F (36.8 C)  97.9 F (36.6 C)  TempSrc: Oral Oral  Oral  SpO2: 95% 96%  94%  Weight:   85 kg  Height:          Data Reviewed:  Basic Metabolic Panel: Recent Labs  Lab 02/07/24 0315 02/08/24 0826 02/09/24 0807 02/12/24 0500 02/12/24 1839  NA 134* 139 139  --   141  K 3.6 3.3* 3.4*  --  3.1*  CL 97* 101 104  --  106  CO2 25 25 21*  --  26  GLUCOSE 108* 109* 95  --  115*  BUN 38* 28* 24*  --  18  CREATININE 1.13 0.94 1.13 0.98 0.97  CALCIUM 7.7* 8.0* 7.6*  --  7.7*    CBC: Recent Labs  Lab 02/07/24 0315 02/08/24 0826 02/09/24 0807 02/11/24 0806  WBC 7.1 6.3 4.3 5.4  NEUTROABS  --   --   --  4.2  HGB 12.8* 13.6 13.9 14.1  HCT 38.4* 41.8 42.5 43.2  MCV 91.0 91.5 92.6 91.3  PLT 218 214 184 153    LFT Recent Labs  Lab 02/09/24 0807 02/12/24 1839  AST 37 44*  ALT 33 52*  ALKPHOS 52 57  BILITOT 0.9 0.8  PROT 5.5* 5.7*  ALBUMIN 2.4* 2.3*     Antibiotics: Anti-infectives (From admission, onward)    Start     Dose/Rate Route Frequency Ordered Stop   02/11/24 1010  fluconazole (DIFLUCAN) 40 MG/ML suspension 100 mg       Note to Pharmacy: Please give suspension due to thrush.   100 mg Oral Daily 02/11/24 1011 02/16/24 0959   02/11/24 1000  fluconazole (DIFLUCAN) 10 MG/ML suspension 100 mg  Status:  Discontinued       Note to Pharmacy: Please give suspension due to thrush.   100 mg Oral Daily 02/11/24 0906 02/11/24 1011   02/07/24 1200  doxycycline (VIBRAMYCIN) 100 mg in sodium chloride 0.9 % 250 mL IVPB  Status:  Discontinued        100 mg 125 mL/hr over 120 Minutes Intravenous Every 12 hours 02/07/24 1122 02/10/24 1201   02/06/24 1000  cefTRIAXone (ROCEPHIN) 2 g in sodium chloride 0.9 % 100 mL IVPB  Status:  Discontinued        2 g 200 mL/hr over 30 Minutes Intravenous Every 24 hours 02/05/24 2315 02/10/24 1201   02/06/24 1000  azithromycin (ZITHROMAX) 500 mg in sodium chloride 0.9 % 250 mL IVPB  Status:  Discontinued        500 mg 250 mL/hr over 60 Minutes Intravenous Every 24 hours 02/05/24 2315 02/07/24 1122   02/05/24 1900  cefTRIAXone (ROCEPHIN) 1 g in sodium chloride 0.9 % 100 mL IVPB        1 g 200 mL/hr over 30 Minutes Intravenous  Once 02/05/24 1859 02/05/24 2100   02/05/24 1900  azithromycin (ZITHROMAX) tablet 500  mg        500 mg Oral  Once 02/05/24 1859 02/05/24 1941        DVT prophylaxis: Lovenox  Code Status: Full code  Family Communication: Discussed with patient's daughter at bedside   CONSULTS oncology   Subjective      Objective    Physical Examination:     Status is: Inpatient:          Meredeth Ide   [facility] If 7PM-7AM, please contact night-coverage at [email or web address], Office  [phone]   02/13/2024, 8:04 AM  LOS: 8 days

## 2024-02-13 NOTE — Plan of Care (Signed)
  Problem: Education: Goal: Knowledge of General Education information will improve Description: Including pain rating scale, medication(s)/side effects and non-pharmacologic comfort measures Outcome: Adequate for Discharge   Problem: Health Behavior/Discharge Planning: Goal: Ability to manage health-related needs will improve Outcome: Adequate for Discharge   Problem: Clinical Measurements: Goal: Ability to maintain clinical measurements within normal limits will improve Outcome: Adequate for Discharge Goal: Will remain free from infection Outcome: Adequate for Discharge Goal: Diagnostic test results will improve Outcome: Adequate for Discharge Goal: Respiratory complications will improve Outcome: Adequate for Discharge Goal: Cardiovascular complication will be avoided Outcome: Adequate for Discharge   Problem: Activity: Goal: Risk for activity intolerance will decrease Outcome: Adequate for Discharge   Problem: Nutrition: Goal: Adequate nutrition will be maintained Outcome: Adequate for Discharge   Problem: Coping: Goal: Level of anxiety will decrease Outcome: Adequate for Discharge   Problem: Elimination: Goal: Will not experience complications related to bowel motility Outcome: Adequate for Discharge Goal: Will not experience complications related to urinary retention Outcome: Adequate for Discharge   Problem: Pain Managment: Goal: General experience of comfort will improve and/or be controlled Outcome: Adequate for Discharge   Problem: Safety: Goal: Ability to remain free from injury will improve Outcome: Adequate for Discharge   Problem: Skin Integrity: Goal: Risk for impaired skin integrity will decrease Outcome: Adequate for Discharge   Problem: Safety: Goal: Non-violent Restraint(s) Outcome: Adequate for Discharge

## 2024-02-13 NOTE — Discharge Summary (Signed)
 Physician Discharge Summary   Patient: Henry May MRN: [id number] DOB: 08/03/1966  Admit date:     02/05/2024  Discharge date: 02/13/24  Discharge Physician: Meredeth Ide   PCP: Tiffany Kocher, DO   Recommendations at discharge:   Follow-up oncology as outpatient  Discharge Diagnoses: Principal Problem:   Acute kidney injury (AKI) with acute tubular necrosis (ATN) (HCC)  Resolved Problems:   * No resolved hospital problems. *  Hospital Course:  58 y.o. 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  Earlier this week he had his first chemotherapy of FOLFOX.  Reportedly since that time he has gotten progressively weaker with nausea, vomiting, and now presents with acute onset altered mental status 24 hours prior to admission.  Hospitalist called for admission, oncology called in consult.     Assessment and Plan:  Acute metabolic encephalopathy versus toxic encephalopathy,  Delirium with active hallucinations -Significantly improved -Unclear etiology, concern for polypharmacy versus infectious process -Trial of lactulose was given -Treated with broad-spectrum antibiotics to cover pneumonia versus UTI -procalcitonin elevated  -MRI negative for acute process -Patient was having hallucinations, improved with olanzapine, QTc interval 468 -Will discontinue olanzapine     Prolonged QT, chronic  -Baseline QTc ranging 470-550 per EKG review over the past few years without history of dysrhythmia. -Stable QTc interval 477   Hypokalemia -Potassium was 3.2 today -Will give IV KCl 10 mill equivalents x 2 -Will follow-up with oncology as outpatient to check BMP next week   Intractable nausea vomiting improving -Likely in the setting of recent chemotherapy versus known gastric cancer and carcinomatosis Significantly improved    AKI,  resolved Hyponatremia, hypovolemic, resolved Hyperkalemia, mild, resolved -Previous creatinine within normal limits, over the past 3 weeks patient's creatinine and GFR have been somewhat diminished with minimally elevated BUN(although likely not elevated enough to cause mental status changes as above)   Dysphagia Resolved   Questionable pneumonia, POA -Chest x-ray with questionable bilateral opacities concerning for possible pneumonia versus pulmonary edema or aspiration -completed  5 days of antibiotics    Generalized weakness PT OT evaluation obtained, no PT needs   Recently diagnosed 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 Recently dosed with first chemotherapy of FOLFOX. Oncology following, appreciate insight recommendations Follow-up oncology as outpatient   Oral ulcers -Secondary to recent chemotherapy -Started on Diflucan 100 mg daily for 4 more days          Consultants: Oncology Procedures performed:  Disposition: Home Diet recommendation:  Discharge Diet Orders (From admission, onward)     Start     Ordered   02/13/24 0000  Diet - low sodium heart healthy        02/13/24 1540           Regular diet DISCHARGE MEDICATION: Allergies as of 02/13/2024   No Known Allergies      Medication List     TAKE these medications    fluconazole 40 MG/ML suspension Commonly known as: DIFLUCAN Take 2.5 mLs (100 mg total) by mouth daily for 4 days. Start taking on: February 14, 2024   hydrocortisone 2.5 % rectal cream Commonly known as: ANUSOL-HC Place rectally 2 (two) times daily.   lidocaine-prilocaine cream Commonly known as: EMLA Apply 1 Application topically as directed. Apply 1/2 tablespoon to port site 1-2 hours prior to stick and cover with Press-and-Seal to numb port site. Do not start  until 14 days of port placement.   omeprazole 20 MG capsule Commonly known as: PRILOSEC Take 20 mg by mouth daily.   ondansetron  8 MG tablet Commonly known as: ZOFRAN Take 1 tablet (8 mg total) by mouth every 8 (eight) hours as needed for nausea. May start 72 hours after chemotherapy treatment given due to receiving Aloxi on day 1   prochlorperazine 10 MG tablet Commonly known as: COMPAZINE Take 1 tablet (10 mg total) by mouth every 6 (six) hours as needed for nausea.   traMADol 50 MG tablet Commonly known as: ULTRAM Take 1 tablet (50 mg total) by mouth every 8 (eight) hours as needed. Do not drive while taking pain medication.               Durable Medical Equipment  (From admission, onward)           Start     Ordered   02/12/24 1421  For home use only DME Walker rolling  Once       Question Answer Comment  Walker: With 5 Inch Wheels   Patient needs a walker to treat with the following condition Difficulty in walking, not elsewhere classified      02/12/24 1421            Follow-up Information     Ladene Artist, MD. Schedule an appointment as soon as possible for a visit.   Specialty: Oncology Contact information: [address] [place] [address] [phone]                Discharge Exam: Ceasar Mons Weights   02/11/24 0701 02/12/24 0706 02/13/24 0500  Weight: 89.7 kg 87.7 kg 85 kg   General-appears in no acute distress Heart-S1-S2, regular, no murmur auscultated Lungs-clear to auscultation bilaterally, no wheezing or crackles auscultated Abdomen-soft, nontender, no organomegaly Extremities-no edema in the lower extremities Neuro-alert, oriented x3, no focal deficit noted  Condition at discharge: good  The results of significant diagnostics from this hospitalization (including imaging, microbiology, ancillary and laboratory) are listed below for reference.   Imaging Studies: MR Brain W and Wo Contrast Result Date: 02/05/2024 CLINICAL DATA:  Provided history: Mental status change, unknown cause. Additional history provided: History of gastric cancer. EXAM:  MRI HEAD WITHOUT AND WITH CONTRAST TECHNIQUE: Multiplanar, multiecho pulse sequences of the brain and surrounding structures were obtained without and with intravenous contrast. CONTRAST:  9mL GADAVIST GADOBUTROL 1 MMOL/ML IV SOLN COMPARISON:  None. FINDINGS: Intermittently motion degraded examination. Most notably, the axial T1-weighted post-contrast sequence is mild-to-moderately motion degraded. Within this limitation, findings are as follows. Brain: Cerebral volume is normal. No cortical encephalomalacia is identified. No significant cerebral white matter disease for age. There is no acute infarct. No evidence of an intracranial mass. No chronic intracranial blood products. No extra-axial fluid collection. No midline shift. No pathologic intracranial enhancement identified. Vascular: Maintained flow voids within the proximal large arterial vessels. 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 Skull and upper cervical spine: No focal worrisome marrow lesion. Sinuses/Orbits: No mass or acute finding within the imaged orbits. 10 mm mucous retention cyst within the left sphenoid sinus. Minimal mucosal thickening within right ethmoid air cells. IMPRESSION: 1. Intermittently motion degraded examination (most notably, there is mild-to-moderate motion degradation of the axial T1-weighted post-contrast sequence). 2. Within this limitation, no evidence of intracranial metastatic disease or an acute intracranial abnormality. 3. 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 4. Otherwise unremarkable MRI appearance  of the brain for age. 5. Mild paranasal sinus disease as described. Electronically Signed   By: Jackey Loge D.O.   On: 02/05/2024 18:56   DG Chest Portable 1 View Result Date: 02/05/2024 CLINICAL DATA:  Weakness. EXAM: PORTABLE CHEST 1 VIEW COMPARISON:  08/22/2020 FINDINGS: Port in the anterior chest wall with tip in distal SVC.  Normal cardiac silhouette. Low lung volumes. There is patchy perihilar airspace disease. No pneumothorax. No focal consolidation. IMPRESSION: Patchy perihilar airspace disease suggests pulmonary edema versus multifocal pneumonia. Electronically Signed   By: Genevive Bi M.D.   On: 02/05/2024 15:50   US Paracentesis Result Date: 02/02/2024 INDICATION: 58 year old male with a history of gastric cancer with recurrent malignant ascites. EXAM: ULTRASOUND GUIDED RIGHT PARACENTESIS MEDICATIONS: 8 mL 1% Lidocaine COMPLICATIONS: None immediate. PROCEDURE: Informed written consent was obtained from the patient after a discussion of the risks, benefits and alternatives to treatment. A timeout was performed prior to the initiation of the procedure. Initial ultrasound scanning demonstrates a large amount of ascites within the right lower abdominal quadrant. The right lower abdomen was prepped and draped in the usual sterile fashion. 1% lidocaine was used for local anesthesia. Following this, a 19 gauge, 7-cm, Yueh catheter was introduced. An ultrasound image was saved for documentation purposes. The paracentesis was performed. The catheter was removed and a dressing was applied. The patient tolerated the procedure well without immediate post procedural complication. FINDINGS: A total of approximately 4.4 L of clear yellow fluid was removed. Samples were sent to the laboratory as requested by the clinical team. IMPRESSION: Successful ultrasound-guided paracentesis yielding 4.4 liters of peritoneal fluid. PLAN: If the patient eventually requires >/=2 paracenteses in a 30 day period, candidacy for formal evaluation by the [facility] will be assessed. Performed By Theresa Mulligan, PA-C Electronically Signed   By: Corlis Leak M.D.   On: 02/02/2024 10:55   CT ABDOMEN PELVIS WO CONTRAST Result Date: 02/01/2024 CLINICAL DATA:  Abdominal pain, acute, nonlocalized. History of  gastric cancer, metastatic. * Tracking Code: BO * EXAM: CT ABDOMEN AND PELVIS WITHOUT CONTRAST TECHNIQUE: Multidetector CT imaging of the abdomen and pelvis was performed following the standard protocol without IV contrast. RADIATION DOSE REDUCTION: This exam was performed according to the departmental dose-optimization program which includes automated exposure control, adjustment of the mA and/or kV according to patient size and/or use of iterative reconstruction technique. COMPARISON:  January 07, 2024. FINDINGS: Evaluation is limited by lack of IV contrast. Lower chest: No acute abnormality. Similar appearance of bibasilar interstitial lung disease. Hepatobiliary: Gallbladder is distended. No extrahepatic biliary ductal dilation. No new focal hepatic abnormality. Pancreas: No new peripancreatic fat stranding. Spleen: Unremarkable. Adrenals/Urinary Tract: Adrenal glands are unremarkable. No hydronephrosis. Nonobstructing bilateral nephrolithiasis. Bladder is unremarkable. Stomach/Bowel: No evidence of bowel obstruction. Similar appearance of gastric wall thickening consistent with known malignancy. Vascular/Lymphatic: No aneurysmal dilation of the abdominal aorta. IVC filter. Similar shotty lymphadenopathy throughout the upper abdomen. Representative porta hepatic lymph node measures 8 mm in the short axis, previously 5 mm (series 2, image 24). Infiltrative soft tissue throughout the upper abdomen consistent with peritoneal carcinomatosis. Reproductive: Prostate is unremarkable. Other: Moderate volume ascites. Revisualization of interdigitating soft tissue throughout the omentum, favored overall increased since prior PET CT. This is consistent with biopsy-proven peritoneal carcinomatosis. Fat containing inguinal hernias. Musculoskeletal: No acute or significant osseous findings. IMPRESSION: 1. No evidence of bowel obstruction. 2. Similar appearance of gastric wall thickening consistent with known malignancy. 3.  Moderate volume ascites with peritoneal carcinomatosis, favored overall increased since prior PET CT. 4. Nonobstructing bilateral nephrolithiasis. Electronically Signed   By: Meda Klinefelter M.D.   On: 02/01/2024 14:02   US Paracentesis Result Date: 01/26/2024 INDICATION: Patient with history of gastric cancer with recurrent malignant ascites. Request received for diagnostic and therapeutic paracentesis up to 4 liters. EXAM: ULTRASOUND GUIDED DIAGNOSTIC AND THERAPEUTIC PARACENTESIS MEDICATIONS: 8 mL 1% lidocaine COMPLICATIONS: None immediate. PROCEDURE: Informed written consent was obtained from the patient after a discussion of the risks, benefits and alternatives to treatment. A timeout was performed prior to the initiation of the procedure. Initial ultrasound scanning demonstrates a moderate amount of ascites within the right lower abdominal quadrant. The right lower abdomen was prepped and draped in the usual sterile fashion. 1% lidocaine was used for local anesthesia. Following this, a 19 gauge, 7-cm, Yueh catheter was introduced. An ultrasound image was saved for documentation purposes. The paracentesis was performed. The catheter was removed and a dressing was applied. The patient tolerated the procedure well without immediate post procedural complication. FINDINGS: A total of approximately 3.1 liters of slightly hazy, yellow fluid was removed. Samples were sent to the laboratory as requested by the clinical team. IMPRESSION: Successful ultrasound-guided paracentesis yielding 3.1 liters of peritoneal fluid. Performed by: Artemio Aly Electronically Signed   By: Corlis Leak M.D.   On: 01/26/2024 12:43   IR Paracentesis Result Date: 01/22/2024 INDICATION: 58 year old with gastric cancer and evidence for peritoneal disease. Patient has developed a large volume of ascites EXAM: ULTRASOUND GUIDED DIAGNOSTIC AND THERAPEUTIC PARACENTESIS MEDICATIONS: None. COMPLICATIONS: None immediate. PROCEDURE:  Informed written consent was obtained from the patient after a discussion of the risks, benefits and alternatives to treatment. A timeout was performed prior to the initiation of the procedure. Initial ultrasound scanning demonstrates a large amount of ascites within the right upper abdominal quadrant. The right lower abdomen was prepped and draped in the usual sterile fashion. 1% lidocaine was used for local anesthesia. Following this, a 6 Fr Safe-T-Centesis catheter was introduced. An ultrasound image was saved for documentation purposes. The paracentesis was performed. The catheter was removed and a dressing was applied. The patient tolerated the procedure well without immediate post procedural complication. FINDINGS: Largest pocket of fluid was in the right upper abdomen around the liver. A total of approximately 2.9 L of yellow fluid was removed. Samples were sent to the laboratory as requested by the clinical team. IMPRESSION: Successful ultrasound-guided paracentesis yielding 2.9 liters of peritoneal fluid. Electronically Signed   By: Richarda Overlie M.D.   On: 01/22/2024 10:46   IR IMAGING GUIDED PORT INSERTION Result Date: 01/22/2024 INDICATION: 57 year old with gastric cancer. Patient needs durable venous access for treatment. EXAM: FLUOROSCOPIC AND ULTRASOUND GUIDED PLACEMENT OF A SUBCUTANEOUS PORT COMPARISON:  None Available. MEDICATIONS: Moderate sedation ANESTHESIA/SEDATION: Moderate (conscious) sedation was employed during this procedure. A total of Versed 1 mg and fentanyl 75 mcg was administered intravenously at the order of the provider performing the procedure. Total intra-service moderate sedation time: 26 minutes. Patient's level of consciousness and vital signs were monitored continuously by radiology nurse throughout the procedure under the supervision of the provider performing the procedure. FLUOROSCOPY TIME:  Radiation Exposure Index (as provided by the fluoroscopic device): 4 mGy Kerma  COMPLICATIONS: None immediate. PROCEDURE: The procedure, risks, benefits, and alternatives were explained to the patient. Questions regarding the procedure were encouraged and answered. The patient understands and consents to the procedure. Patient was placed supine on the interventional table.  Ultrasound confirmed a patent right internal jugular vein. Ultrasound image was saved for documentation. The right chest and neck were cleaned with a skin antiseptic and a sterile drape was placed. Maximal barrier sterile technique was utilized including caps, mask, sterile gowns, sterile gloves, sterile drape, hand hygiene and skin antiseptic. The right neck was anesthetized with 1% lidocaine. Small incision was made in the right neck with a blade. Micropuncture set was placed in the right internal jugular vein with ultrasound guidance. The micropuncture wire was used for measurement purposes. The right chest was anesthetized with 1% lidocaine with epinephrine. #15 blade was used to make an incision and a subcutaneous port pocket was formed. 8 french Power Port was assembled. Subcutaneous tunnel was formed with a stiff tunneling device. The port catheter was brought through the subcutaneous tunnel. The port was placed in the subcutaneous pocket. The micropuncture set was exchanged for a peel-away sheath. The catheter was placed through the peel-away sheath and the tip was positioned at the superior cavoatrial junction. Catheter placement was confirmed with fluoroscopy. The port was accessed and flushed with heparinized saline. The port pocket was closed using two layers of absorbable sutures and Dermabond. The vein skin site was closed using a single layer of absorbable suture and Dermabond. Sterile dressings were applied. Patient tolerated the procedure well without an immediate complication. Ultrasound and fluoroscopic images were taken and saved for this procedure. IMPRESSION: Placement of a subcutaneous power-injectable  port device. Catheter tip at the superior cavoatrial junction. Electronically Signed   By: Richarda Overlie M.D.   On: 01/22/2024 10:43   CT ABDOMINAL MASS BIOPSY Result Date: 01/22/2024 INDICATION: 58 year old with gastric cancer. Request for peritoneal biopsy to stage the disease. EXAM: CT-GUIDED BIOPSY OF OMENTAL/PERITONEAL THICKENING TECHNIQUE: Multidetector CT imaging of the abdomen was performed following the standard protocol with/without IV contrast. RADIATION DOSE REDUCTION: This exam was performed according to the departmental dose-optimization program which includes automated exposure control, adjustment of the mA and/or kV according to patient size and/or use of iterative reconstruction technique. MEDICATIONS: Moderate sedation ANESTHESIA/SEDATION: Moderate (conscious) sedation was employed during this procedure. A total of Versed 1 mg and Fentanyl 75 mcg was administered intravenously by the radiology nurse. Total intra-service moderate Sedation Time: 15 minutes. The patient's level of consciousness and vital signs were monitored continuously by radiology nursing throughout the procedure under my direct supervision. COMPLICATIONS: None immediate. PROCEDURE: Informed written consent was obtained from the patient after a thorough discussion of the procedural risks, benefits and alternatives. All questions were addressed. A timeout was performed prior to the initiation of the procedure. CT images through the abdomen were obtained. Extensive omental/peritoneal thickening in the anterior abdomen was identified and targeted for biopsy. The left anterior abdomen was prepped with chlorhexidine and sterile field was created. Skin was anesthetized with 1% lidocaine. Small incision was made. Using CT guidance, a 17 gauge coaxial needle was directed into the left anterior peritoneal thickening. Core biopsies were obtained with an 18 gauge device. Specimens placed in formalin. Needle was removed without complication.  Bandage placed over the puncture site. FINDINGS: CT images of the abdomen demonstrate progression of peritoneal disease since 01/07/2024. The patient has now developed increased ascites particularly in the perihepatic region. In addition, there is markedly increased omental/peritoneal thickening in the anterior abdomen. Biopsy needle was directed into the left anterior abdominal peritoneal disease. No immediate bleeding or hematoma formation. IMPRESSION: 1. CT-guided biopsy of omental/peritoneal thickening. 2. Progression of peritoneal disease demonstrated by increased peritoneal thickening  and ascites. Electronically Signed   By: Richarda Overlie M.D.   On: 01/22/2024 10:42    Microbiology: Results for orders placed or performed during the hospital encounter of 02/05/24  Blood culture (routine x 2)     Status: None   Collection Time: 02/05/24  6:59 PM   Specimen: BLOOD  Result Value Ref Range Status   Specimen Description   Final    BLOOD Performed at [facility], [address] [place], [place] [address]    Special Requests   Final    NONE Performed at [facility], [address], [place], [place] [address]    Culture   Final    NO GROWTH 5 DAYS Performed at [facility], [address] [place], [place] [address]    Report Status 02/10/2024 FINAL  Final  Blood culture (routine x 2)     Status: None   Collection Time: 02/05/24  7:04 PM   Specimen: BLOOD  Result Value Ref Range Status   Specimen Description   Final    BLOOD BLOOD RIGHT ARM Performed at [facility], [address] [place], [place] [address]    Special Requests   Final    Blood Culture adequate volume BOTTLES DRAWN AEROBIC AND ANAEROBIC Performed at [facility], [address] [place], [place] [address]    Culture   Final    NO GROWTH 5 DAYS Performed at [facility], [address], [place], [place] [address]    Report  Status 02/10/2024 FINAL  Final  Resp panel by RT-PCR (RSV, Flu A&B, Covid) Peripheral     Status: None   Collection Time: 02/05/24  7:34 PM   Specimen: Peripheral; Nasal Swab  Result Value Ref Range Status   SARS Coronavirus 2 by RT PCR NEGATIVE NEGATIVE Final    Comment: (NOTE) SARS-CoV-2 target nucleic acids are NOT DETECTED.  The SARS-CoV-2 RNA is generally detectable in upper respiratory specimens during the acute phase of infection. The lowest concentration of SARS-CoV-2 viral copies this assay can detect is 138 copies/mL. A negative result does not preclude SARS-Cov-2 infection and should not be used as the sole basis for treatment or other patient management decisions. A negative result may occur with  improper specimen collection/handling, submission of specimen other than nasopharyngeal swab, presence of viral mutation(s) within the areas targeted by this assay, and inadequate number of viral copies(<138 copies/mL). A negative result must be combined with clinical observations, patient history, and epidemiological information. The expected result is Negative.  Fact Sheet for Patients:  BloggerCourse.com  Fact Sheet for Healthcare Providers:  SeriousBroker.it  This test is no t yet approved or cleared by the [place] FDA and  has been authorized for detection and/or diagnosis of SARS-CoV-2 by FDA under an Emergency Use Authorization (EUA). This EUA will remain  in effect (meaning this test can be used) for the duration of the COVID-19 declaration under Section 564(b)(1) of the Act, 21 U.S.C.section 360bbb-3(b)(1), unless the authorization is terminated  or revoked sooner.       Influenza A by PCR NEGATIVE NEGATIVE Final   Influenza B by PCR NEGATIVE NEGATIVE Final    Comment: (NOTE) The Xpert Xpress SARS-CoV-2/FLU/RSV plus assay is intended as an aid in the diagnosis of influenza from Nasopharyngeal swab specimens  and should not be used as a sole basis for treatment. Nasal washings and aspirates are unacceptable for Xpert Xpress SARS-CoV-2/FLU/RSV testing.  Fact Sheet for Patients:  BloggerCourse.com  Fact Sheet for Healthcare Providers: SeriousBroker.it  This test is not yet approved or cleared by the [place] FDA and has been authorized for detection and/or diagnosis of SARS-CoV-2 by FDA under an Emergency Use Authorization (EUA). This EUA will remain in effect (meaning this test can be used) for the duration of the COVID-19 declaration under Section 564(b)(1) of the Act, 21 U.S.C. section 360bbb-3(b)(1), unless the authorization is terminated or revoked.     Resp Syncytial Virus by PCR NEGATIVE NEGATIVE Final    Comment: (NOTE) Fact Sheet for Patients: BloggerCourse.com  Fact Sheet for Healthcare Providers: [email or web address]  This test is not yet approved or cleared by the [place] FDA and has been authorized for detection and/or diagnosis of SARS-CoV-2 by FDA under an Emergency Use Authorization (EUA). This EUA will remain in effect (meaning this test can be used) for the duration of the COVID-19 declaration under Section 564(b)(1) of the Act, 21 U.S.C. section 360bbb-3(b)(1), unless the authorization is terminated or revoked.  Performed at [facility], [address] [place], [place] [address]     Labs: CBC: Recent Labs  Lab 02/07/24 0315 02/08/24 0826 02/09/24 0807 02/11/24 0806  WBC 7.1 6.3 4.3 5.4  NEUTROABS  --   --   --  4.2  HGB 12.8* 13.6 13.9 14.1  HCT 38.4* 41.8 42.5 43.2  MCV 91.0 91.5 92.6 91.3  PLT 218 214 184 153   Basic Metabolic Panel: Recent Labs  Lab 02/07/24 0315 02/08/24 0826 02/09/24 0807 02/12/24 0500 02/12/24 1839 02/13/24 1146  NA 134* 139 139  --  141 139  K 3.6 3.3* 3.4*  --  3.1* 3.2*  CL 97* 101 104  --   106 104  CO2 25 25 21*  --  26 25  GLUCOSE 108* 109* 95  --  115* 105*  BUN 38* 28* 24*  --  18 18  CREATININE 1.13 0.94 1.13 0.98 0.97 0.88  CALCIUM 7.7* 8.0* 7.6*  --  7.7* 7.6*   Liver Function Tests: Recent Labs  Lab 02/09/24 0807 02/12/24 1839  AST 37 44*  ALT 33 52*  ALKPHOS 52 57  BILITOT 0.9 0.8  PROT 5.5* 5.7*  ALBUMIN 2.4* 2.3*   CBG: No results for input(s): "GLUCAP" in the last 168 hours.  Discharge time spent: greater than 30 minutes.  Signed: Meredeth Ide, MD [facility] 02/13/2024

## 2024-02-15 ENCOUNTER — Other Ambulatory Visit: Payer: Self-pay | Admitting: Oncology

## 2024-02-16 ENCOUNTER — Telehealth: Payer: Self-pay

## 2024-02-16 ENCOUNTER — Inpatient Hospital Stay: Payer: No Typology Code available for payment source

## 2024-02-16 ENCOUNTER — Other Ambulatory Visit: Payer: Self-pay

## 2024-02-16 ENCOUNTER — Inpatient Hospital Stay (HOSPITAL_BASED_OUTPATIENT_CLINIC_OR_DEPARTMENT_OTHER): Payer: No Typology Code available for payment source | Admitting: Nurse Practitioner

## 2024-02-16 ENCOUNTER — Encounter: Payer: Self-pay | Admitting: Nurse Practitioner

## 2024-02-16 ENCOUNTER — Inpatient Hospital Stay: Payer: No Typology Code available for payment source | Attending: Nurse Practitioner

## 2024-02-16 ENCOUNTER — Other Ambulatory Visit: Payer: Self-pay | Admitting: Oncology

## 2024-02-16 VITALS — BP 121/76 | HR 74 | Temp 98.2°F | Resp 18 | Ht 72.0 in | Wt 187.8 lb

## 2024-02-16 DIAGNOSIS — C169 Malignant neoplasm of stomach, unspecified: Secondary | ICD-10-CM | POA: Insufficient documentation

## 2024-02-16 DIAGNOSIS — D701 Agranulocytosis secondary to cancer chemotherapy: Secondary | ICD-10-CM

## 2024-02-16 DIAGNOSIS — Z5111 Encounter for antineoplastic chemotherapy: Secondary | ICD-10-CM | POA: Insufficient documentation

## 2024-02-16 DIAGNOSIS — C162 Malignant neoplasm of body of stomach: Secondary | ICD-10-CM

## 2024-02-16 DIAGNOSIS — K1230 Oral mucositis (ulcerative), unspecified: Secondary | ICD-10-CM

## 2024-02-16 DIAGNOSIS — Z5112 Encounter for antineoplastic immunotherapy: Secondary | ICD-10-CM | POA: Insufficient documentation

## 2024-02-16 DIAGNOSIS — Z5189 Encounter for other specified aftercare: Secondary | ICD-10-CM | POA: Diagnosis not present

## 2024-02-16 DIAGNOSIS — T451X5A Adverse effect of antineoplastic and immunosuppressive drugs, initial encounter: Secondary | ICD-10-CM | POA: Diagnosis not present

## 2024-02-16 DIAGNOSIS — Z95828 Presence of other vascular implants and grafts: Secondary | ICD-10-CM

## 2024-02-16 LAB — CMP (CANCER CENTER ONLY)
ALT: 69 U/L — ABNORMAL HIGH (ref 0–44)
AST: 55 U/L — ABNORMAL HIGH (ref 15–41)
Albumin: 3.3 g/dL — ABNORMAL LOW (ref 3.5–5.0)
Alkaline Phosphatase: 76 U/L (ref 38–126)
Anion gap: 10 (ref 5–15)
BUN: 16 mg/dL (ref 6–20)
CO2: 26 mmol/L (ref 22–32)
Calcium: 8 mg/dL — ABNORMAL LOW (ref 8.9–10.3)
Chloride: 103 mmol/L (ref 98–111)
Creatinine: 0.88 mg/dL (ref 0.61–1.24)
GFR, Estimated: >60 mL/min (ref >60)
Glucose, Bld: 97 mg/dL (ref 70–99)
Potassium: 3.3 mmol/L — ABNORMAL LOW (ref 3.5–5.1)
Sodium: 139 mmol/L (ref 135–145)
Total Bilirubin: 0.4 mg/dL (ref 0.0–1.2)
Total Protein: 6.2 g/dL — ABNORMAL LOW (ref 6.5–8.1)

## 2024-02-16 LAB — CBC WITH DIFFERENTIAL (CANCER CENTER ONLY)
Abs Immature Granulocytes: 0 10*3/uL (ref 0.00–0.07)
Basophils Absolute: 0 10*3/uL (ref 0.0–0.1)
Basophils Relative: 1 %
Eosinophils Absolute: 0.1 10*3/uL (ref 0.0–0.5)
Eosinophils Relative: 5 %
HCT: 40.6 % (ref 39.0–52.0)
Hemoglobin: 13.7 g/dL (ref 13.0–17.0)
Immature Granulocytes: 0 %
Lymphocytes Relative: 41 %
Lymphs Abs: 0.8 10*3/uL (ref 0.7–4.0)
MCH: 29.9 pg (ref 26.0–34.0)
MCHC: 33.7 g/dL (ref 30.0–36.0)
MCV: 88.6 fL (ref 80.0–100.0)
Monocytes Absolute: 0.3 10*3/uL (ref 0.1–1.0)
Monocytes Relative: 16 %
Neutro Abs: 0.7 10*3/uL — ABNORMAL LOW (ref 1.7–7.7)
Neutrophils Relative %: 37 %
Platelet Count: 197 10*3/uL (ref 150–400)
RBC: 4.58 MIL/uL (ref 4.22–5.81)
RDW: 13 % (ref 11.5–15.5)
WBC Count: 1.9 10*3/uL — ABNORMAL LOW (ref 4.0–10.5)
nRBC: 0 % (ref 0.0–0.2)

## 2024-02-16 MED ORDER — HEPARIN SOD (PORK) LOCK FLUSH 100 UNIT/ML IV SOLN
500.0000 [IU] | Freq: Once | INTRAVENOUS | Status: AC
  Administered 2024-02-16: 500 [IU] via INTRAVENOUS

## 2024-02-16 MED ORDER — SODIUM CHLORIDE 0.9% FLUSH
10.0000 mL | INTRAVENOUS | Status: AC | PRN
Start: 2024-02-16 — End: ?
  Administered 2024-02-16: 10 mL via INTRAVENOUS

## 2024-02-16 NOTE — Addendum Note (Signed)
 Addended by: Dimitri Ped on: 02/16/2024 11:37 AM   Modules accepted: Orders

## 2024-02-16 NOTE — Progress Notes (Signed)
 [facility] OFFICE PROGRESS NOTE   Diagnosis: Gastric cancer  INTERVAL HISTORY:   Mr. Eckrich returns for follow-up.  He completed cycle 1 FOLFOX 02/03/2024.  He was hospitalized 02/05/2024 through 02/13/2024 with altered mental status.  He and his wife report mental status is better.  His wife continues to note mild confusion.  Mouth sores are better.  No nausea or vomiting.  No hand or foot pain or redness.  He is tolerating a liquid diet for the most part.  Appetite in general is better.  No diarrhea.  Objective:  Vital signs in last 24 hours:  Blood pressure 121/76, pulse 74, temperature 98.2 F (36.8 C), temperature source Temporal, resp. rate 18, height 6' (1.829 m), weight 187 lb 12.8 oz (85.2 kg), SpO2 100%.    HEENT:  Erythema across the posterior palate.  Ulcers bottom of tongue. Resp: Lungs clear bilaterally. Cardio: Regular rate and rhythm. GI: Firm fullness across the upper abdomen.  No hepatomegaly.  No apparent ascites. Vascular: No leg edema. Neuro: Alert, mildly confused.  Repeatedly stated it was 2005 when asked the year.  Follows commands. Skin: Palms without erythema. Port-A-Cath without erythema.  Lab Results:  Lab Results  Component Value Date   WBC 1.9 (L) 02/16/2024   HGB 13.7 02/16/2024   HCT 40.6 02/16/2024   MCV 88.6 02/16/2024   PLT 197 02/16/2024   NEUTROABS 0.7 (L) 02/16/2024    Imaging:  No results found.  Medications: I have reviewed the patient's current medications.  Assessment/Plan: Gastric cancer 11/21/2023 EGD-gastric body with "infiltrative looking lesion"; biopsy shows involvement of a hypercellular lesion that suggests diffuse carcinoma by morphology CTs abdomen/pelvis 11/28/2023-diffuse fatty infiltration of the liver; gastric wall thickening; lymph nodes up to 6 mm in diameter near the stomach wall. 12/31/2023 upper endoscopy-large diffuse friable infiltrative polypoid and ulcerated circumferential mass found in the  gastric body.  Scope was passed beyond the mass with normal antrum/pylorus.  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  Negative for HER2 (1+); mismatch repair protein IHC normal; PD-L1 CPS 0%; CLDN18 positive: 90% of tumor cells with 2+/3+ membrane staining PET scan 01/07/2024-circumferential hypermetabolic gastric mucosal thickening.  Ill-defined hypermetabolic lymph nodes in the gastrohepatic ligament.  Intense hypermetabolic activity associated with the right lobe of the thyroid gland.  Small hypermetabolic right axillary node favored reactive. Biopsy omental/peritoneal thickening 01/22/2024-poorly differentiated adenocarcinoma with focal signet ring cell features Paracentesis 01/22/2024-ascites positive for malignancy, adenocarcinoma Cycle 1 FOLFOX 02/03/2024 Early satiety, postprandial abdominal pain, weight loss secondary to #1 History of bilateral lower extremity DVT 2021-anticoagulation discontinued due to massive retroperitoneal bleed, IVC filter placed 08/24/2020 Hospitalized with acute respiratory failure due to COVID-19 08/06/2020 - 09/04/2020 History of massive retroperitoneal bleed secondary to anticoagulation September 2021 Thyroid ultrasound 01/13/2024-no abnormal nodule identified in the right lobe.  1.1 cm thyroid isthmus nodule meets criteria for 1 year follow-up ultrasound. Admission 02/01/2024 with increased abdominal pain/ascites Hospitalization with altered mental status 02/05/2024 through 02/13/2024; question rare case of 5-FU psychosis.  Improved 02/16/2024. Neutropenia 02/16/2024 Mucositis 02/16/2024  Disposition: Mr. Bedwell appears stable.  He has completed 1 cycle of FOLFOX.  He was subsequently admitted with altered mental status, etiology not completely clear.  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  Mental status continues to improve.    On exam today he has persistent mucositis.  He will  continue supportive care.    Review  of the CBC shows neutropenia.  He and his wife understand to contact the office with fever, chills, other signs of infection.    We will obtain DPD deficiency testing today.  Dr. Truett Perna reviewed his recommendation for single agent oxaliplatin with the next treatment, consider adding Zolbetuximab in the future, with Mr Alves and his wife.  We also discussed adding white cell growth factor support.  They are in agreement with this plan.  They understand it is possible the altered mental status could recur.  He will return for follow-up and oxaliplatin in 1 week.  We are available to see him sooner if needed.  Patient seen with Dr. Truett Perna.    Lonna Cobb ANP/GNP-BC   02/16/2024  10:02 AM  This was a shared visit with Lonna Cobb.  Mr. Loving was interviewed and examined.  He was discharged in the hospital on 02/13/2024.  His mental status appears further improved, but not yet at baseline.  His overall performance status has improved following 1 cycle of FOLFOX.  He was admitted with encephalopathy.  We think this is most likely related to chemotherapy as no other explanation for the altered mental status was found.  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  He also had oral mucositis.  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  Zolbetuximab will be added to the systemic therapy regimen after another cycle.  He will be treated with single agent oxaliplatin if the neutrophil count is adequate when he returns next week.  A treatment plan was entered today.  I was present for greater than 50% of today's visit.  I performed medical decision making.  Mancel Bale, MD

## 2024-02-16 NOTE — Telephone Encounter (Signed)
-----   Message from Lonna Cobb sent at 02/16/2024  1:34 PM EST ----- Please let his wife know the potassium level is mildly low, improved as compared to when he was in the hospital.  We will check it again next week.

## 2024-02-16 NOTE — Telephone Encounter (Signed)
 Patient gave verbal understanding and had no further questions at this time.

## 2024-02-16 NOTE — Progress Notes (Signed)
 DISCONTINUE ON PATHWAY REGIMEN - Gastroesophageal     A cycle is every 14 days:     Oxaliplatin      Leucovorin      Fluorouracil      Fluorouracil   **Always confirm dose/schedule in your pharmacy ordering system**  PRIOR TREATMENT: GEOS3: mFOLFOX6 q14 Days Until Progression or Unacceptable Toxicity  START ON PATHWAY REGIMEN - Gastroesophageal     Cycles 1: A cycle is 14 days:     Zolbetuximab-clzb      Oxaliplatin      Leucovorin      Fluorouracil      Fluorouracil    Cycles 2 through 12: A cycle is every 14 days:     Zolbetuximab-clzb      Oxaliplatin      Leucovorin      Fluorouracil      Fluorouracil    Cycles 13 and beyond: A cycle is every 14 days:     Zolbetuximab-clzb      Leucovorin      Fluorouracil      Fluorouracil   **Always confirm dose/schedule in your pharmacy ordering system**  Patient Characteristics: Distant Metastases (cM1/pM1) / Locally Recurrent Disease, Adenocarcinoma - Esophageal, GE Junction, and Gastric, First Line, HER2 Negative/Unknown, PD?L1 Expression CPS < 5/Negative/Unknown, MSS/pMMR or MSI Unknown, CLDN18.2 Positive Therapeutic Status: Distant Metastases (No Additional Staging) Histology: Adenocarcinoma Disease Classification: Gastric Line of Therapy: First Line HER2 Status: Negative PD-L1 Expression Status: PD-L1 Negative Microsatellite/Mismatch Repair Status: MSS/pMMR CLDN18.2 Status: CLDN18.2 Positive Intent of Therapy: Non-Curative / Palliative Intent, Discussed with Patient

## 2024-02-16 NOTE — Transitions of Care (Post Inpatient/ED Visit) (Signed)
   02/16/2024  Name: Rhythm Wigfall MRN: [id number] DOB: Mar 24, 1966  Today's TOC FU Call Status: Today's TOC FU Call Status:: Unsuccessful Call (1st Attempt) Unsuccessful Call (1st Attempt) Date: 02/16/24  Attempted to reach the patient regarding the most recent Inpatient/ED visit.  Follow Up Plan: Additional outreach attempts will be made to reach the patient to complete the Transitions of Care (Post Inpatient/ED visit) call.   Hilbert Odor RN, CCM [place]  VBCI-Population Health RN Care Manager [phone]

## 2024-02-17 ENCOUNTER — Inpatient Hospital Stay

## 2024-02-17 ENCOUNTER — Telehealth: Payer: Self-pay

## 2024-02-17 NOTE — Transitions of Care (Post Inpatient/ED Visit) (Signed)
   02/17/2024  Name: Taylin Leder MRN: [id number] DOB: [birth year]  Today's TOC FU Call Status: Today's TOC FU Call Status:: Successful TOC FU Call Completed TOC FU Call Complete Date: 02/17/24 Patient's Name and Date of Birth confirmed.  Transition Care Management Follow-up Telephone Call   [facility] RN spoke with patient and wife who state patient was just seen by MD yesterday 02/16/24 and declined participation in [facility] program.  [facility] RN provided name/number for patient/wife to reach out with any questions/concerns.     Hilbert Odor RN, CCM [place]  VBCI-Population Health RN Care Manager [phone]

## 2024-02-20 ENCOUNTER — Inpatient Hospital Stay

## 2024-02-20 ENCOUNTER — Encounter: Payer: Self-pay | Admitting: Oncology

## 2024-02-20 NOTE — Progress Notes (Signed)
 [facility] CSW Progress Note  Visual merchandiser met with patient's spouse via telephone to offer emotional and social support. CSW is scheduled to meet with patient and family on 3/10 during infusion     Marguerita Merles, LCSW Clinical Social Worker [facility]

## 2024-02-23 ENCOUNTER — Inpatient Hospital Stay

## 2024-02-23 ENCOUNTER — Encounter: Payer: Self-pay | Admitting: Nurse Practitioner

## 2024-02-23 ENCOUNTER — Inpatient Hospital Stay (HOSPITAL_BASED_OUTPATIENT_CLINIC_OR_DEPARTMENT_OTHER): Admitting: Nurse Practitioner

## 2024-02-23 ENCOUNTER — Encounter: Payer: Self-pay | Admitting: Oncology

## 2024-02-23 VITALS — BP 124/77 | HR 60 | Temp 98.1°F | Resp 18 | Ht 72.0 in | Wt 183.0 lb

## 2024-02-23 VITALS — BP 133/76 | HR 61 | Temp 97.6°F | Resp 16

## 2024-02-23 DIAGNOSIS — C169 Malignant neoplasm of stomach, unspecified: Secondary | ICD-10-CM

## 2024-02-23 DIAGNOSIS — Z5112 Encounter for antineoplastic immunotherapy: Secondary | ICD-10-CM | POA: Diagnosis not present

## 2024-02-23 DIAGNOSIS — Z95828 Presence of other vascular implants and grafts: Secondary | ICD-10-CM

## 2024-02-23 LAB — CBC WITH DIFFERENTIAL (CANCER CENTER ONLY)
Abs Immature Granulocytes: 0.1 10*3/uL — ABNORMAL HIGH (ref 0.00–0.07)
Basophils Absolute: 0 10*3/uL (ref 0.0–0.1)
Basophils Relative: 1 %
Eosinophils Absolute: 0.1 10*3/uL (ref 0.0–0.5)
Eosinophils Relative: 2 %
HCT: 37.2 % — ABNORMAL LOW (ref 39.0–52.0)
Hemoglobin: 12.5 g/dL — ABNORMAL LOW (ref 13.0–17.0)
Immature Granulocytes: 2 %
Lymphocytes Relative: 33 %
Lymphs Abs: 1.6 10*3/uL (ref 0.7–4.0)
MCH: 29.7 pg (ref 26.0–34.0)
MCHC: 33.6 g/dL (ref 30.0–36.0)
MCV: 88.4 fL (ref 80.0–100.0)
Monocytes Absolute: 0.7 10*3/uL (ref 0.1–1.0)
Monocytes Relative: 14 %
Neutro Abs: 2.2 10*3/uL (ref 1.7–7.7)
Neutrophils Relative %: 48 %
Platelet Count: 369 10*3/uL (ref 150–400)
RBC: 4.21 MIL/uL — ABNORMAL LOW (ref 4.22–5.81)
RDW: 13.8 % (ref 11.5–15.5)
WBC Count: 4.6 10*3/uL (ref 4.0–10.5)
nRBC: 0 % (ref 0.0–0.2)

## 2024-02-23 LAB — CMP (CANCER CENTER ONLY)
ALT: 37 U/L (ref 0–44)
AST: 29 U/L (ref 15–41)
Albumin: 3.5 g/dL (ref 3.5–5.0)
Alkaline Phosphatase: 85 U/L (ref 38–126)
Anion gap: 8 (ref 5–15)
BUN: 14 mg/dL (ref 6–20)
CO2: 28 mmol/L (ref 22–32)
Calcium: 8.6 mg/dL — ABNORMAL LOW (ref 8.9–10.3)
Chloride: 104 mmol/L (ref 98–111)
Creatinine: 0.95 mg/dL (ref 0.61–1.24)
GFR, Estimated: >60 mL/min (ref >60)
Glucose, Bld: 85 mg/dL (ref 70–99)
Potassium: 3.8 mmol/L (ref 3.5–5.1)
Sodium: 140 mmol/L (ref 135–145)
Total Bilirubin: 0.2 mg/dL (ref 0.0–1.2)
Total Protein: 6.5 g/dL (ref 6.5–8.1)

## 2024-02-23 MED ORDER — DEXTROSE 5 % IV SOLN
INTRAVENOUS | Status: DC
Start: 2024-02-23 — End: 2024-02-23

## 2024-02-23 MED ORDER — PALONOSETRON HCL INJECTION 0.25 MG/5ML
0.2500 mg | Freq: Once | INTRAVENOUS | Status: AC
  Administered 2024-02-23: 0.25 mg via INTRAVENOUS
  Filled 2024-02-23: qty 5

## 2024-02-23 MED ORDER — DEXAMETHASONE SODIUM PHOSPHATE 10 MG/ML IJ SOLN
10.0000 mg | Freq: Once | INTRAMUSCULAR | Status: AC
  Administered 2024-02-23: 10 mg via INTRAVENOUS
  Filled 2024-02-23: qty 1

## 2024-02-23 MED ORDER — HEPARIN SOD (PORK) LOCK FLUSH 100 UNIT/ML IV SOLN
500.0000 [IU] | Freq: Once | INTRAVENOUS | Status: AC | PRN
Start: 2024-02-23 — End: 2024-02-23
  Administered 2024-02-23: 500 [IU]

## 2024-02-23 MED ORDER — SODIUM CHLORIDE 0.9% FLUSH
10.0000 mL | INTRAVENOUS | Status: DC | PRN
Start: 2024-02-23 — End: 2024-02-23
  Administered 2024-02-23: 10 mL

## 2024-02-23 MED ORDER — OXALIPLATIN CHEMO INJECTION 100 MG/20ML
85.0000 mg/m2 | Freq: Once | INTRAVENOUS | Status: AC
  Administered 2024-02-23: 175 mg via INTRAVENOUS
  Filled 2024-02-23: qty 35

## 2024-02-23 MED ORDER — SODIUM CHLORIDE 0.9% FLUSH
10.0000 mL | INTRAVENOUS | Status: DC | PRN
Start: 2024-02-23 — End: 2024-02-23
  Administered 2024-02-23: 10 mL via INTRAVENOUS

## 2024-02-23 NOTE — Progress Notes (Unsigned)
 Patient seen by Lonna Cobb NP today  Vitals are within treatment parameters:Yes   Labs are within treatment parameters: Yes   Treatment plan has been signed: Yes   Per physician team, Patient is ready for treatment and there are NO modifications to the treatment plan. Only Oxaliplatin

## 2024-02-23 NOTE — Progress Notes (Unsigned)
 [facility] OFFICE PROGRESS NOTE   Diagnosis: Gastric cancer  INTERVAL HISTORY:   Henry May returns as scheduled.  He is accompanied by his daughter.  Mental status is near baseline.  Family notes that he becomes frustrated more easily.  Appetite is better.  Intermittent nausea/vomiting.  No diarrhea.  No abdominal pain.  Right knee pain this morning.  He has had similar pain in the past, attributes to arthritis.  Mouth has healed.  Objective:  Vital signs in last 24 hours:  Blood pressure 124/77, pulse 60, temperature 98.1 F (36.7 C), temperature source Temporal, resp. rate 18, height 6' (1.829 m), weight 183 lb (83 kg), SpO2 100%.    HEENT: No thrush or ulcers. Resp: Lungs clear bilaterally. Cardio: Regular rate and rhythm. GI: Firm fullness across the upper abdomen.  No hepatomegaly.  Nontender. Vascular: No leg edema. Neuro: Alert, oriented.  Follows commands.  Correctly states the year as 2025. Skin: Palms without erythema. Port-A-Cath without erythema.  Lab Results:  Lab Results  Component Value Date   WBC 4.6 02/23/2024   HGB 12.5 (L) 02/23/2024   HCT 37.2 (L) 02/23/2024   MCV 88.4 02/23/2024   PLT 369 02/23/2024   NEUTROABS 2.2 02/23/2024    Imaging:  No results found.  Medications: I have reviewed the patient's current medications.  Assessment/Plan: Gastric cancer 11/21/2023 EGD-gastric body with "infiltrative looking lesion"; biopsy shows involvement of a hypercellular lesion that suggests diffuse carcinoma by morphology CTs abdomen/pelvis 11/28/2023-diffuse fatty infiltration of the liver; gastric wall thickening; lymph nodes up to 6 mm in diameter near the stomach wall. 12/31/2023 upper endoscopy-large diffuse friable infiltrative polypoid and ulcerated circumferential mass found in the gastric body.  Scope was passed beyond the mass with normal antrum/pylorus.  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  Negative for HER2 (1+); mismatch repair protein IHC normal; PD-L1 CPS 0%; CLDN18 positive: 90% of tumor cells with 2+/3+ membrane staining PET scan 01/07/2024-circumferential hypermetabolic gastric mucosal thickening.  Ill-defined hypermetabolic lymph nodes in the gastrohepatic ligament.  Intense hypermetabolic activity associated with the right lobe of the thyroid gland.  Small hypermetabolic right axillary node favored reactive. 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  1.1 cm thyroid isthmus nodule meets criteria for 1 year follow-up ultrasound. Admission 02/01/2024 with increased abdominal pain/ascites Hospitalization with altered mental status 02/05/2024 through 02/13/2024; question rare case of 5-FU psychosis.  Improved 02/16/2024.  Mental status at baseline 02/23/2024. Neutropenia 02/16/2024 Mucositis 02/16/2024.  Resolved 02/23/2024  Disposition: Henry May appears stable.  He is accompanied by his daughter.  Mental status is further improved.  Plan to resume treatment today with single agent oxaliplatin.  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  They  understand the altered mental status could recur despite discontinuing 5-FU.  He and his daughter agree to proceed with treatment today  as scheduled.  We discussed adding zolbetuximab in 2 weeks.  CBC and chemistry panel reviewed.  Labs adequate to proceed as above.  We discussed white cell growth factor support.  We reviewed potential side effects including bone pain, rash, splenic rupture.  He agrees to proceed.  He will return for follow-up and treatment in 2 weeks.  We are available to see him sooner if needed.  Patient seen with Dr. Truett Perna.  Lonna Cobb ANP/GNP-BC   02/23/2024  11:34 AM  This was a shared visit with Lonna Cobb.  Henry May is interviewed and examined.  His mental status appears at baseline.  His clinical status has improved following 1 cycle of FOLFOX.  The altered mental status following cycle 1 was most likely secondary to 5-FU.  The plan is to proceed with oxaliplatin chemotherapy today.  He and his family understand the possibility his symptoms are related oxaliplatin.  He will receive G-CSF support with this cycle.  The plan is to add zolbetuximab with the next cycle of chemotherapy.  We reviewed potential toxicities associated with zolbetuximab.  I was present for greater than 50% of today's visit.  I performed medical decision making.  Mancel Bale, MD

## 2024-02-23 NOTE — Progress Notes (Signed)
 [facility] CSW Progress Note  Visual merchandiser met with patient and caregiver during the infusion appointment. Patient and spouse reported tolerating treatment well and were in good spirits. Interventions Patient and CSW completed SNAP application. (CSW submitted application on patient's behalf at [facility] DSS Portal). CSW informed patient and family that CSW does not receive updates on application, and contact CSW if needed.  CSW and patient / spouse discussed disability application. CSW informed patient and spouse on how to check status at [facility] portal.  CSW will attempt to check status of emergency medicaid completed during hospital stay.  CSW and patient / spouse discussed [facility] Financial Assistance, patient and spouse need $5,000 balance to apply.     Marguerita Merles, LCSW Clinical Social Worker [facility]

## 2024-02-23 NOTE — Patient Instructions (Signed)

## 2024-02-23 NOTE — Progress Notes (Signed)
 Patient presents today for chemotherapy infusion of Oxaliplatin. Patient is in satisfactory condition with no new complaints voiced.  Vital signs are stable.  Labs reviewed by Lonna Cobb NP during the office visit and all labs are within treatment parameters.  We will proceed with treatment per NP/MD orders.   Patient tolerated treatment well with no complaints voiced.  Patient left ambulatory in stable condition.  Vital signs stable at discharge.  Follow up as scheduled.

## 2024-02-23 NOTE — Patient Instructions (Signed)
 [facility] - A DEPT OF [facility]  Discharge Instructions: Thank you for choosing [facility] to provide your oncology and hematology care.   If you have a lab appointment with the Cancer Center, please go directly to the Cancer Center and check in at the registration area.   Wear comfortable clothing and clothing appropriate for easy access to any Portacath or PICC line.   We strive to give you quality time with your provider. You may need to reschedule your appointment if you arrive late (15 or more minutes).  Arriving late affects you and other patients whose appointments are after yours.  Also, if you miss three or more appointments without notifying the office, you may be dismissed from the clinic at the provider's discretion.      For prescription refill requests, have your pharmacy contact our office and allow 72 hours for refills to be completed.    Today you received the following chemotherapy and/or immunotherapy agents Oxaliplation.  Oxaliplatin Injection What is this medication? OXALIPLATIN (ox AL i PLA tin) treats colorectal cancer. It works by slowing down the growth of cancer cells. This medicine may be used for other purposes; ask your health care provider or pharmacist if you have questions. COMMON BRAND NAME(S): Eloxatin What should I tell my care team before I take this medication? They need to know if you have any of these conditions: Heart disease History of irregular heartbeat or rhythm Liver disease Low blood cell levels (white cells, red cells, and platelets) Lung or breathing disease, such as asthma Take medications that treat or prevent blood clots Tingling of the fingers, toes, or other nerve disorder An unusual or allergic reaction to oxaliplatin, other medications, foods, dyes, or preservatives If you or your partner are pregnant or trying to get pregnant Breast-feeding How should I use this medication? This  medication is injected into a vein. It is given by your care team in a hospital or clinic setting. Talk to your care team about the use of this medication in children. Special care may be needed. Overdosage: If you think you have taken too much of this medicine contact a poison control center or emergency room at once. NOTE: This medicine is only for you. Do not share this medicine with others. What if I miss a dose? Keep appointments for follow-up doses. It is important not to miss a dose. Call your care team if you are unable to keep an appointment. What may interact with this medication? Do not take this medication with any of the following: Cisapride Dronedarone Pimozide Thioridazine This medication may also interact with the following: Aspirin and aspirin-like medications Certain medications that treat or prevent blood clots, such as warfarin, apixaban, dabigatran, and rivaroxaban Cisplatin Cyclosporine Diuretics Medications for infection, such as acyclovir, adefovir, amphotericin B, bacitracin, cidofovir, foscarnet, ganciclovir, gentamicin, pentamidine, vancomycin NSAIDs, medications for pain and inflammation, such as ibuprofen or naproxen Other medications that cause heart rhythm changes Pamidronate Zoledronic acid This list may not describe all possible interactions. Give your health care provider a list of all the medicines, herbs, non-prescription drugs, or dietary supplements you use. Also tell them if you smoke, drink alcohol, or use illegal drugs. Some items may interact with your medicine. What should I watch for while using this medication? Your condition will be monitored carefully while you are receiving this medication. You may need blood work while taking this medication. This medication may make you feel generally unwell.  This is not uncommon as chemotherapy can affect healthy cells as well as cancer cells. Report any side effects. Continue your course of treatment even  though you feel ill unless your care team tells you to stop. This medication may increase your risk of getting an infection. Call your care team for advice if you get a fever, chills, sore throat, or other symptoms of a cold or flu. Do not treat yourself. Try to avoid being around people who are sick. Avoid taking medications that contain aspirin, acetaminophen, ibuprofen, naproxen, or ketoprofen unless instructed by your care team. These medications may hide a fever. Be careful brushing or flossing your teeth or using a toothpick because you may get an infection or bleed more easily. If you have any dental work done, tell your dentist you are receiving this medication. This medication can make you more sensitive to cold. Do not drink cold drinks or use ice. Cover exposed skin before coming in contact with cold temperatures or cold objects. When out in cold weather wear warm clothing and cover your mouth and nose to warm the air that goes into your lungs. Tell your care team if you get sensitive to the cold. Talk to your care team if you or your partner are pregnant or think either of you might be pregnant. This medication can cause serious birth defects if taken during pregnancy and for 9 months after the last dose. A negative pregnancy test is required before starting this medication. A reliable form of contraception is recommended while taking this medication and for 9 months after the last dose. Talk to your care team about effective forms of contraception. Do not father a child while taking this medication and for 6 months after the last dose. Use a condom while having sex during this time period. Do not breastfeed while taking this medication and for 3 months after the last dose. This medication may cause infertility. Talk to your care team if you are concerned about your fertility. What side effects may I notice from receiving this medication? Side effects that you should report to your care team as  soon as possible: Allergic reactions--skin rash, itching, hives, swelling of the face, lips, tongue, or throat Bleeding--bloody or black, tar-like stools, vomiting blood or brown material that looks like coffee grounds, red or dark brown urine, small red or purple spots on skin, unusual bruising or bleeding Dry cough, shortness of breath or trouble breathing Heart rhythm changes--fast or irregular heartbeat, dizziness, feeling faint or lightheaded, chest pain, trouble breathing Infection--fever, chills, cough, sore throat, wounds that don't heal, pain or trouble when passing urine, general feeling of discomfort or being unwell Liver injury--right upper belly pain, loss of appetite, nausea, light-colored stool, dark yellow or brown urine, yellowing skin or eyes, unusual weakness or fatigue Low red blood cell level--unusual weakness or fatigue, dizziness, headache, trouble breathing Muscle injury--unusual weakness or fatigue, muscle pain, dark yellow or brown urine, decrease in amount of urine Pain, tingling, or numbness in the hands or feet Sudden and severe headache, confusion, change in vision, seizures, which may be signs of posterior reversible encephalopathy syndrome (PRES) Unusual bruising or bleeding Side effects that usually do not require medical attention (report to your care team if they continue or are bothersome): Diarrhea Nausea Pain, redness, or swelling with sores inside the mouth or throat Unusual weakness or fatigue Vomiting This list may not describe all possible side effects. Call your doctor for medical advice about side effects. You may  report side effects to FDA at 1-800-FDA-1088. Where should I keep my medication? This medication is given in a hospital or clinic. It will not be stored at home. NOTE: This sheet is a summary. It may not cover all possible information. If you have questions about this medicine, talk to your doctor, pharmacist, or health care provider.  2024  Elsevier/Gold Standard (2023-11-14 00:00:00)   To help prevent nausea and vomiting after your treatment, we encourage you to take your nausea medication as directed.  BELOW ARE SYMPTOMS THAT SHOULD BE REPORTED IMMEDIATELY: *FEVER GREATER THAN 100.4 F (38 C) OR HIGHER *CHILLS OR SWEATING *NAUSEA AND VOMITING THAT IS NOT CONTROLLED WITH YOUR NAUSEA MEDICATION *UNUSUAL SHORTNESS OF BREATH *UNUSUAL BRUISING OR BLEEDING *URINARY PROBLEMS (pain or burning when urinating, or frequent urination) *BOWEL PROBLEMS (unusual diarrhea, constipation, pain near the anus) TENDERNESS IN MOUTH AND THROAT WITH OR WITHOUT PRESENCE OF ULCERS (sore throat, sores in mouth, or a toothache) UNUSUAL RASH, SWELLING OR PAIN  UNUSUAL VAGINAL DISCHARGE OR ITCHING   Items with * indicate a potential emergency and should be followed up as soon as possible or go to the Emergency Department if any problems should occur.  Please show the CHEMOTHERAPY ALERT CARD or IMMUNOTHERAPY ALERT CARD at check-in to the Emergency Department and triage nurse.  Should you have questions after your visit or need to cancel or reschedule your appointment, please contact [facility] - A DEPT OF [facility]  Dept: [phone]  and follow the prompts.  Office hours are 8:00 a.m. to 4:30 p.m. Monday - Friday. Please note that voicemails left after 4:00 p.m. may not be returned until the following business day.  We are closed weekends and major holidays. You have access to a nurse at all times for urgent questions. Please call the main number to the clinic Dept: [phone] and follow the prompts.   For any non-urgent questions, you may also contact your provider using MyChart. We now offer e-Visits for anyone 7 and older to request care online for non-urgent symptoms. For details visit [email or web address].   Also download the MyChart app! Go to the app store, search "MyChart", open the app, select [facility], and log in with your MyChart username and password.

## 2024-02-24 ENCOUNTER — Encounter: Payer: Self-pay | Admitting: Oncology

## 2024-02-24 ENCOUNTER — Other Ambulatory Visit: Payer: Self-pay | Admitting: Oncology

## 2024-02-25 ENCOUNTER — Encounter: Admitting: Nutrition

## 2024-02-25 ENCOUNTER — Inpatient Hospital Stay

## 2024-02-25 VITALS — BP 128/80 | HR 57 | Temp 98.7°F | Resp 18

## 2024-02-25 DIAGNOSIS — C169 Malignant neoplasm of stomach, unspecified: Secondary | ICD-10-CM

## 2024-02-25 DIAGNOSIS — Z5112 Encounter for antineoplastic immunotherapy: Secondary | ICD-10-CM | POA: Diagnosis not present

## 2024-02-25 MED ORDER — PEGFILGRASTIM-JMDB 6 MG/0.6ML ~~LOC~~ SOSY
6.0000 mg | PREFILLED_SYRINGE | Freq: Once | SUBCUTANEOUS | Status: AC
  Administered 2024-02-25: 6 mg via SUBCUTANEOUS

## 2024-02-25 NOTE — Patient Instructions (Signed)

## 2024-02-26 ENCOUNTER — Other Ambulatory Visit: Payer: Self-pay | Admitting: Nurse Practitioner

## 2024-02-26 ENCOUNTER — Encounter: Payer: Self-pay | Admitting: Oncology

## 2024-02-27 ENCOUNTER — Telehealth: Payer: Self-pay | Admitting: Oncology

## 2024-02-27 ENCOUNTER — Encounter: Payer: Self-pay | Admitting: Specialist

## 2024-02-27 NOTE — Telephone Encounter (Signed)
 Spoke with patient confirming upcoming appointment

## 2024-02-29 ENCOUNTER — Other Ambulatory Visit: Payer: Self-pay

## 2024-03-01 ENCOUNTER — Ambulatory Visit (HOSPITAL_BASED_OUTPATIENT_CLINIC_OR_DEPARTMENT_OTHER)

## 2024-03-01 ENCOUNTER — Ambulatory Visit (HOSPITAL_BASED_OUTPATIENT_CLINIC_OR_DEPARTMENT_OTHER): Admitting: Student

## 2024-03-01 ENCOUNTER — Telehealth: Payer: Self-pay | Admitting: *Deleted

## 2024-03-01 ENCOUNTER — Inpatient Hospital Stay (HOSPITAL_BASED_OUTPATIENT_CLINIC_OR_DEPARTMENT_OTHER): Admitting: Nurse Practitioner

## 2024-03-01 ENCOUNTER — Encounter: Payer: Self-pay | Admitting: Nurse Practitioner

## 2024-03-01 ENCOUNTER — Encounter (HOSPITAL_BASED_OUTPATIENT_CLINIC_OR_DEPARTMENT_OTHER): Payer: Self-pay | Admitting: Student

## 2024-03-01 ENCOUNTER — Inpatient Hospital Stay

## 2024-03-01 ENCOUNTER — Other Ambulatory Visit: Payer: Self-pay | Admitting: *Deleted

## 2024-03-01 VITALS — Ht 72.0 in | Wt 182.6 lb

## 2024-03-01 VITALS — BP 125/71 | HR 56 | Temp 98.1°F | Resp 18 | Ht 72.0 in | Wt 182.6 lb

## 2024-03-01 DIAGNOSIS — Z5112 Encounter for antineoplastic immunotherapy: Secondary | ICD-10-CM | POA: Diagnosis not present

## 2024-03-01 DIAGNOSIS — M25561 Pain in right knee: Secondary | ICD-10-CM

## 2024-03-01 DIAGNOSIS — C169 Malignant neoplasm of stomach, unspecified: Secondary | ICD-10-CM

## 2024-03-01 DIAGNOSIS — M7989 Other specified soft tissue disorders: Secondary | ICD-10-CM

## 2024-03-01 LAB — CBC WITH DIFFERENTIAL (CANCER CENTER ONLY)
Abs Immature Granulocytes: 1.6 10*3/uL — ABNORMAL HIGH (ref 0.00–0.07)
Band Neutrophils: 7 %
Basophils Absolute: 0 10*3/uL (ref 0.0–0.1)
Basophils Relative: 0 %
Eosinophils Absolute: 0.3 10*3/uL (ref 0.0–0.5)
Eosinophils Relative: 1 %
HCT: 39.6 % (ref 39.0–52.0)
Hemoglobin: 13 g/dL (ref 13.0–17.0)
Lymphocytes Relative: 10 %
Lymphs Abs: 2.6 10*3/uL (ref 0.7–4.0)
MCH: 29.3 pg (ref 26.0–34.0)
MCHC: 32.8 g/dL (ref 30.0–36.0)
MCV: 89.2 fL (ref 80.0–100.0)
Metamyelocytes Relative: 2 %
Monocytes Absolute: 1.8 10*3/uL — ABNORMAL HIGH (ref 0.1–1.0)
Monocytes Relative: 7 %
Myelocytes: 4 %
Neutro Abs: 19.7 10*3/uL — ABNORMAL HIGH (ref 1.7–7.7)
Neutrophils Relative %: 69 %
Platelet Count: 296 10*3/uL (ref 150–400)
RBC: 4.44 MIL/uL (ref 4.22–5.81)
RDW: 14.6 % (ref 11.5–15.5)
WBC Count: 25.9 10*3/uL — ABNORMAL HIGH (ref 4.0–10.5)
nRBC: 0.3 % — ABNORMAL HIGH (ref 0.0–0.2)

## 2024-03-01 LAB — CMP (CANCER CENTER ONLY)
ALT: 39 U/L (ref 0–44)
AST: 32 U/L (ref 15–41)
Albumin: 4 g/dL (ref 3.5–5.0)
Alkaline Phosphatase: 176 U/L — ABNORMAL HIGH (ref 38–126)
Anion gap: 7 (ref 5–15)
BUN: 17 mg/dL (ref 6–20)
CO2: 29 mmol/L (ref 22–32)
Calcium: 10 mg/dL (ref 8.9–10.3)
Chloride: 102 mmol/L (ref 98–111)
Creatinine: 1.07 mg/dL (ref 0.61–1.24)
GFR, Estimated: >60 mL/min (ref >60)
Glucose, Bld: 85 mg/dL (ref 70–99)
Potassium: 4.9 mmol/L (ref 3.5–5.1)
Sodium: 138 mmol/L (ref 135–145)
Total Bilirubin: 0.3 mg/dL (ref 0.0–1.2)
Total Protein: 7.6 g/dL (ref 6.5–8.1)

## 2024-03-01 LAB — URIC ACID: Uric Acid, Serum: 7.9 mg/dL (ref 3.7–8.6)

## 2024-03-01 MED ORDER — LIDOCAINE HCL 1 % IJ SOLN
4.0000 mL | INTRAMUSCULAR | Status: AC | PRN
Start: 2024-03-01 — End: 2024-03-01
  Administered 2024-03-01: 4 mL

## 2024-03-01 MED ORDER — TRIAMCINOLONE ACETONIDE 40 MG/ML IJ SUSP
2.0000 mL | INTRAMUSCULAR | Status: AC | PRN
  Administered 2024-03-01: 2 mL via INTRA_ARTICULAR

## 2024-03-01 NOTE — Progress Notes (Signed)
 Chief Complaint: Right knee pain     History of Present Illness:    Henry May is a 58 y.o. male presenting today with acute pain and swelling of his right knee.  Patient states that this began 2 days ago without any known injury.  He does have history of gout in his left great toe and does report that prior to onset he did eat some ham which he usually does not do.  States that his knee is moderately painful, swollen, and difficult to weight-bear on.  He has tried ibuprofen and ice which only bring very short-term relief.  He is currently undergoing treatment for gastric cancer and had a DVT study completed this morning which was negative.  Denies any fever or chills.   Surgical History:   None  PMH/PSH/Family History/Social History/Meds/Allergies:    Past Medical History:  Diagnosis Date   Acute respiratory failure (HCC) 08/06/2020   Chronic cough    SINCE 08-06-2020 COVID PNEUMONIA   COVID 08/06/2020   Dvt femoral (deep venous thrombosis) (HCC) 08/2020   LEFT LEG    History of blood transfusion 08/2020   AFTER MI 5 UNITS GIVEN   History of kidney stones    Hypertension    Numbness    LEFT LEG AT TIMES   Pneumonia 08/06/2020   COVID PNEUMONIA   Past Surgical History:  Procedure Laterality Date   CYSTOSCOPY WITH STENT PLACEMENT Left 10/06/2020   Procedure: CYSTOSCOPY WITH STENT PLACEMENT;  Surgeon: Rene Paci, MD;  Location: [facility];  Service: Urology;  Laterality: Left;   CYSTOSCOPY/URETEROSCOPY/HOLMIUM LASER/STENT PLACEMENT Left 11/14/2020   Procedure: CYSTOSCOPY/URETEROSCOPY/HOLMIUM LASER/STENT PLACEMENT;  Surgeon: Rene Paci, MD;  Location: [facility];  Service: Urology;  Laterality: Left;  ONLY NEEDS 45 MIN   IR IMAGING GUIDED PORT INSERTION  01/22/2024   IR IVC FILTER PLMT / S&I /IMG GUID/MOD SED  08/24/2020   IR PARACENTESIS  01/22/2024   Social History   Socioeconomic History   Marital status: Married     Spouse name: Not on file   Number of children: 3   Years of education: Not on file   Highest education level: Not on file  Occupational History   Not on file  Tobacco Use   Smoking status: Former    Current packs/day: 0.00    Types: Cigarettes    Quit date: 1986    Years since quitting: 39.2    Passive exposure: Never   Smokeless tobacco: Never  Vaping Use   Vaping status: Never Used  Substance and Sexual Activity   Alcohol use: Yes    Comment: occasional   Drug use: Never   Sexual activity: Yes  Other Topics Concern   Not on file  Social History Narrative   Not on file   Social Drivers of Health   Financial Resource Strain: Not on file  Food Insecurity: No Food Insecurity (02/05/2024)   Hunger Vital Sign    Worried About Running Out of Food in the Last Year: Never true    Ran Out of Food in the Last Year: Never true  Transportation Needs: No Transportation Needs (02/05/2024)   PRAPARE - Administrator, Civil Service (Medical): No    Lack of Transportation (Non-Medical): No  Physical Activity: Not on file  Stress: Not  on file  Social Connections: Unknown (04/30/2022)   [facility]   Social Network    Social Network: Not on file   Family History  Problem Relation Age of Onset   Cancer Mother        type unknown   Diabetes Mellitus II Neg Hx    Allergies  Allergen Reactions   Fluorouracil Other (See Comments)    5-FU induced psychosis. See progress note from 02/11/24   Current Outpatient Medications  Medication Sig Dispense Refill   hydrocortisone (ANUSOL-HC) 2.5 % rectal cream Place rectally 2 (two) times daily. 30 g 0   lidocaine-prilocaine (EMLA) cream Apply 1 Application topically as directed. Apply 1/2 tablespoon to port site 1-2 hours prior to stick and cover with Press-and-Seal to numb port site. Do not start until 14 days of port placement. (Patient not taking: Reported on 02/01/2024) 30 g 3   omeprazole (PRILOSEC)  20 MG capsule Take 20 mg by mouth daily.     ondansetron (ZOFRAN) 8 MG tablet Take 1 tablet (8 mg total) by mouth every 8 (eight) hours as needed for nausea. May start 72 hours after chemotherapy treatment given due to receiving Aloxi on day 1 (Patient not taking: Reported on 02/01/2024) 30 tablet 1   prochlorperazine (COMPAZINE) 10 MG tablet Take 1 tablet (10 mg total) by mouth every 6 (six) hours as needed for nausea. (Patient not taking: Reported on 02/23/2024) 60 tablet 1   traMADol (ULTRAM) 50 MG tablet Take 1 tablet (50 mg total) by mouth every 8 (eight) hours as needed. Do not drive while taking pain medication. (Patient not taking: Reported on 02/23/2024) 30 tablet 0   No current facility-administered medications for this visit.   Facility-Administered Medications Ordered in Other Visits  Medication Dose Route Frequency Provider Last Rate Last Admin   sodium chloride flush (NS) 0.9 % injection 10 mL  10 mL Intravenous PRN Ladene Artist, MD   10 mL at 02/16/24 1129   VAS [place] LOWER EXTREMITY VENOUS (DVT) Result Date: 03/01/2024  Lower Venous DVT Study Patient Name:  Henry May  Date of Exam:   03/01/2024 Medical Rec #: [id number]       Accession #:    [id number] Date of Birth: 07-02-1966       Patient Gender: M Patient Age:   69 years Exam Location:  [facility] Procedure:      VAS [place] LOWER EXTREMITY VENOUS (DVT) Referring Phys: Lonna Cobb --------------------------------------------------------------------------------  Other Indications: Patient reports right leg swelling for the past two days                    after eating ham. Patient states has history of gout and                    wonders if this is contributing. Comparison Study: 08/07/2020 right DVT peroneal; left DVT left femoral, left                   popliteal, left posterior tibial veins, left peroneal veins.                    09/12/2021 negative for DVT bilaterally Performing Technologist: Jeryl Columbia RDCS  Examination Guidelines:  A complete evaluation includes B-mode imaging, spectral Doppler, color Doppler, and power Doppler as needed of all accessible portions of each vessel. Bilateral testing is considered an integral part of a complete examination. Limited examinations for reoccurring indications  may be performed as noted. The reflux portion of the exam is performed with the patient in reverse Trendelenburg.  +---------+---------------+---------+-----------+----------+--------------+ RIGHT    CompressibilityPhasicitySpontaneityPropertiesThrombus Aging +---------+---------------+---------+-----------+----------+--------------+ CFV      Full           Yes      Yes                                 +---------+---------------+---------+-----------+----------+--------------+ SFJ      Full           Yes      Yes                                 +---------+---------------+---------+-----------+----------+--------------+ FV Prox  Full           Yes      Yes                                 +---------+---------------+---------+-----------+----------+--------------+ FV Mid   Full           Yes      Yes                                 +---------+---------------+---------+-----------+----------+--------------+ FV DistalFull           Yes      Yes                                 +---------+---------------+---------+-----------+----------+--------------+ PFV      Full                                                        +---------+---------------+---------+-----------+----------+--------------+ POP      Full           Yes      Yes                                 +---------+---------------+---------+-----------+----------+--------------+ PTV      Full           Yes      Yes                                 +---------+---------------+---------+-----------+----------+--------------+ PERO     Full           Yes      Yes                                  +---------+---------------+---------+-----------+----------+--------------+ Gastroc  Full           Yes      Yes                                 +---------+---------------+---------+-----------+----------+--------------+ GSV      Full  Yes      Yes                                 +---------+---------------+---------+-----------+----------+--------------+   +----+---------------+---------+-----------+----------+--------------+ LEFTCompressibilityPhasicitySpontaneityPropertiesThrombus Aging +----+---------------+---------+-----------+----------+--------------+ CFV Full           Yes      Yes                                 +----+---------------+---------+-----------+----------+--------------+   Findings reported to Lonna Cobb, NP at 1200.  Summary: RIGHT: - No evidence of deep vein thrombosis in the lower extremity. No indirect evidence of obstruction proximal to the inguinal ligament.  - No cystic structure found in the popliteal fossa.  LEFT: - No evidence of common femoral vein obstruction.   *See table(s) above for measurements and observations.    Preliminary     Review of Systems:   A ROS was performed including pertinent positives and negatives as documented in the HPI.  Physical Exam :   Constitutional: NAD and appears stated age Neurological: Alert and oriented Psych: Appropriate affect and cooperative Height 6' (1.829 m), weight 182 lb 9.6 oz (82.8 kg).   Comprehensive Musculoskeletal Exam:    Tenderness palpation diffusely throughout the right knee which does appear swollen and mildly erythematous and warm.  Active range of motion from 0 to 100 degrees.  No laxity with varus or valgus stress.  Distal neurosensory exam intact.  Imaging:   Xray (right knee 4 views): Negative for acute bony abnormality with well-maintained joint spacing.   I personally reviewed and interpreted the radiographs.   Assessment:   58 y.o. male with 2-day history of  right knee pain, swelling, and warmth.  No recent history of injury and patient does have history of gout in the left foot which was resolved with medication.  He does not take any preventative medication on a daily basis.  X-rays reviewed today are negative for any acute abnormality.  Overall his symptoms do appear very consistent with a gout flare therefore I have recommended an aspiration/injection today which he is agreeable to.  I was able to aspirate 17 mL of cloudy synovial fluid followed by cortisone injection which he did appear to receive relief from pain clinic.  Will plan to follow-up with him concerning results of synovial fluid analysis.  Plan :    -Aspiration/cortisone injection performed today of the right knee -Fluid sent for synovial analysis -Follow-up as needed     Procedure Note  Patient: [employer]             Date of Birth: December 21, 1965           MRN: [id number]             Visit Date: 03/01/2024  Procedures: Visit Diagnoses:  1. Acute pain of right knee     Large Joint Inj: R knee on 03/01/2024 4:01 PM Indications: pain Details: 18 G 1.5 in needle, superolateral approach Medications: 4 mL lidocaine 1 %; 2 mL triamcinolone acetonide 40 MG/ML Aspirate: 17 mL cloudy; sent for lab analysis Outcome: tolerated well, no immediate complications Procedure, treatment alternatives, risks and benefits explained, specific risks discussed. Consent was given by the patient. Immediately prior to procedure a time out was called to verify the correct patient, procedure, equipment, support staff and site/side marked as required. Patient was prepped and draped in  the usual sterile fashion.       I personally saw and evaluated the patient, and participated in the management and treatment plan.  Hazle Nordmann, PA-C Orthopedics

## 2024-03-01 NOTE — Telephone Encounter (Signed)
 See telephone note

## 2024-03-01 NOTE — Progress Notes (Signed)
 [facility] OFFICE PROGRESS NOTE   Diagnosis: Gastric cancer  INTERVAL HISTORY:   Henry May returns prior to scheduled follow-up for evaluation of right leg edema, redness and warmth involving the right knee.  Venous Doppler study earlier today was negative for right lower extremity DVT.  He completed a cycle of oxaliplatin 02/23/2024.  He received white cell growth factor support 02/25/2024.  2 days ago, during the night, he developed pain involving the right knee.  He reports this occurred after eating "ham".  He thought symptoms felt similar to a gout flare involving the left big toe several years ago.  Today he notes swelling and mild redness involving the knee.  No fever.  No shaking chills.  No leg edema.  He reports superficial varicosities involving the right leg are chronic.  No nausea or vomiting.  No mouth sores.  No diarrhea.  No confusion.  Objective:  Vital signs in last 24 hours:  Blood pressure 125/71, pulse (!) 56, temperature 98.1 F (36.7 C), temperature source Temporal, resp. rate 18, height 6' (1.829 m), weight 182 lb 9.6 oz (82.8 kg), SpO2 98%.    HEENT: No thrush or ulcers. Resp: Lungs clear bilaterally. Cardio: Regular rate and rhythm. GI: Abdomen soft and nontender.  No hepatosplenomegaly. Vascular: No leg edema.  Varicosities bilateral lower leg, right greater than left.  Calf soft and nontender. Musculoskeletal : Right knee is edematous, mild erythema.   Neuro: Alert and oriented. Skin: Palms without erythema. Portacath without erythema.  Lab Results:  Lab Results  Component Value Date   WBC 4.6 02/23/2024   HGB 12.5 (L) 02/23/2024   HCT 37.2 (L) 02/23/2024   MCV 88.4 02/23/2024   PLT 369 02/23/2024   NEUTROABS 2.2 02/23/2024    Imaging:  No results found.  Medications: I have reviewed the patient's current medications.  Assessment/Plan: Gastric cancer 11/21/2023 EGD-gastric body with "infiltrative looking lesion"; biopsy  shows involvement of a hypercellular lesion that suggests diffuse carcinoma by morphology CTs abdomen/pelvis 11/28/2023-diffuse fatty infiltration of the liver; gastric wall thickening; lymph nodes up to 6 mm in diameter near the stomach wall. 12/31/2023 upper endoscopy-large diffuse friable infiltrative polypoid and ulcerated circumferential mass found in the gastric body.  Scope was passed beyond the mass with normal antrum/pylorus.  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  Negative for HER2 (1+); mismatch repair protein IHC normal; PD-L1 CPS 0%; CLDN18 positive: 90% of tumor cells with 2+/3+ membrane staining PET scan 01/07/2024-circumferential hypermetabolic gastric mucosal thickening.  Ill-defined hypermetabolic lymph nodes in the gastrohepatic ligament.  Intense hypermetabolic activity associated with the right lobe of the thyroid gland.  Small hypermetabolic right axillary node favored reactive. 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  1.1 cm thyroid isthmus nodule meets criteria for 1 year follow-up ultrasound. Admission 02/01/2024 with increased abdominal  pain/ascites Hospitalization with altered mental status 02/05/2024 through 02/13/2024; question  rare case of 5-FU psychosis.  Improved 02/16/2024.  Mental status at baseline 02/23/2024. Neutropenia 02/16/2024 Mucositis 02/16/2024.  Resolved 02/23/2024 Right knee pain/edema/erythema 03/01/2024-Doppler study negative for DVT History of gout  Disposition: [name] appears stable.  He has metastatic gastric cancer.  He completed a cycle Oxaliplatin on 02/23/2024.  He received white cell growth factor support.  He presents today with onset of right knee pain a few days ago.  He has a history of gout.  He is not febrile.  Doppler study completed just prior to our visit is negative for DVT.  We will obtain labs today including CBC, chemistry panel and uric acid.  We contacted the orthopedic department at [facility].  They will see him later today to evaluate for a gout flare versus other etiology.    Lonna Cobb ANP/GNP-BC   03/01/2024  12:48 PM

## 2024-03-02 ENCOUNTER — Inpatient Hospital Stay

## 2024-03-02 ENCOUNTER — Ambulatory Visit: Payer: Self-pay

## 2024-03-02 NOTE — Progress Notes (Signed)
 [facility] CSW Progress Note  Visual merchandiser (CSW) received phone call from patient's spouse. Patient's and family have been approved for SNAP benefits.    Patient's spouse reported letter shows household of 2 instead of 3. CSW will attempt to clarify with DSS. CSW submitted referral to patient financial specialist for the [employer]. CSW sent follow up email to referral for Cancer Services.    Follow up at next infusion.   Marguerita Merles, LCSW Clinical Social Worker [facility]

## 2024-03-03 ENCOUNTER — Inpatient Hospital Stay: Admitting: Nutrition

## 2024-03-03 ENCOUNTER — Encounter: Payer: Self-pay | Admitting: Nutrition

## 2024-03-03 NOTE — Progress Notes (Signed)
 Nutrition follow up completed with patient's wife. Patient receives Oxaliplatin for metastatic Gastric cancer and is followed by Dr. Truett Perna.  Weight decreased and documented as 182 pounds 9.6 oz on March 17. This is decreased from 216 pounds 11.2 oz on January 23.  !5% weight loss in less than 3 months/clinically significant.  Labs and medication reviewed.  Wife reports abdominal pain is much better. He does not tolerate milk and does not like oral nutrition supplements such as Ensure. He was encouraged by wife to try whey protein powder but refused. Citrus fruits and berries "bother" his stomach. He does not like artificial flavors or substitutions.  Currently tolerating chicken, rice, spaghetti noodles, potatoes, coffee, sandwiches and yogurt.  Wife questions adding Vitamin C supplement. Wants education on low purine diet for Gout.  Reports food insecurity and is willing to accept a food bag from the food pantry along with some clear liquid nutrition drinks. She will pick this up when he comes for treatment on March 25.   Nutrition Diagnosis: Unintended weight loss continues.  Intervention: Educated on low purine diet.  Recommended patient eat more vitamin C foods and provided education. Encouraged small, frequent meals/snacks. Try clear liquid ONS such as Ensure Clear and Complete. Will provide food bag, ONS samples, coupons, written fact sheets to be picked up on March 25. SW notified.  Monitoring, Evaluation, Goals: Patient will tolerate increased calories and protein to minimize further weight loss.  Follow up to be scheduled as needed.

## 2024-03-04 ENCOUNTER — Encounter (HOSPITAL_COMMUNITY): Payer: Self-pay

## 2024-03-04 ENCOUNTER — Ambulatory Visit (HOSPITAL_COMMUNITY): Admit: 2024-03-04 | Payer: No Typology Code available for payment source | Admitting: Gastroenterology

## 2024-03-04 SURGERY — UPPER ENDOSCOPIC ULTRASOUND (EUS) RADIAL
Anesthesia: Monitor Anesthesia Care

## 2024-03-06 ENCOUNTER — Other Ambulatory Visit: Payer: Self-pay | Admitting: Oncology

## 2024-03-09 ENCOUNTER — Other Ambulatory Visit: Payer: Self-pay | Admitting: Pharmacist

## 2024-03-09 ENCOUNTER — Inpatient Hospital Stay (HOSPITAL_BASED_OUTPATIENT_CLINIC_OR_DEPARTMENT_OTHER): Admitting: Oncology

## 2024-03-09 ENCOUNTER — Inpatient Hospital Stay

## 2024-03-09 ENCOUNTER — Other Ambulatory Visit: Payer: Self-pay | Admitting: *Deleted

## 2024-03-09 ENCOUNTER — Telehealth: Payer: Self-pay | Admitting: Oncology

## 2024-03-09 VITALS — BP 111/72 | HR 64 | Temp 98.1°F | Resp 18 | Ht 72.0 in | Wt 176.4 lb

## 2024-03-09 VITALS — BP 125/78 | HR 59 | Temp 98.0°F | Resp 16

## 2024-03-09 DIAGNOSIS — C169 Malignant neoplasm of stomach, unspecified: Secondary | ICD-10-CM | POA: Diagnosis not present

## 2024-03-09 DIAGNOSIS — Z5112 Encounter for antineoplastic immunotherapy: Secondary | ICD-10-CM | POA: Diagnosis not present

## 2024-03-09 LAB — CBC WITH DIFFERENTIAL (CANCER CENTER ONLY)
Abs Immature Granulocytes: 0.27 10*3/uL — ABNORMAL HIGH (ref 0.00–0.07)
Basophils Absolute: 0.1 10*3/uL (ref 0.0–0.1)
Basophils Relative: 0 %
Eosinophils Absolute: 0.1 10*3/uL (ref 0.0–0.5)
Eosinophils Relative: 1 %
HCT: 40.4 % (ref 39.0–52.0)
Hemoglobin: 13.5 g/dL (ref 13.0–17.0)
Immature Granulocytes: 2 %
Lymphocytes Relative: 16 %
Lymphs Abs: 1.9 10*3/uL (ref 0.7–4.0)
MCH: 29.7 pg (ref 26.0–34.0)
MCHC: 33.4 g/dL (ref 30.0–36.0)
MCV: 88.8 fL (ref 80.0–100.0)
Monocytes Absolute: 0.7 10*3/uL (ref 0.1–1.0)
Monocytes Relative: 5 %
Neutro Abs: 9.1 10*3/uL — ABNORMAL HIGH (ref 1.7–7.7)
Neutrophils Relative %: 76 %
Platelet Count: 221 10*3/uL (ref 150–400)
RBC: 4.55 MIL/uL (ref 4.22–5.81)
RDW: 15.3 % (ref 11.5–15.5)
WBC Count: 12 10*3/uL — ABNORMAL HIGH (ref 4.0–10.5)
nRBC: 0 % (ref 0.0–0.2)

## 2024-03-09 LAB — CMP (CANCER CENTER ONLY)
ALT: 60 U/L — ABNORMAL HIGH (ref 0–44)
AST: 32 U/L (ref 15–41)
Albumin: 4 g/dL (ref 3.5–5.0)
Alkaline Phosphatase: 173 U/L — ABNORMAL HIGH (ref 38–126)
Anion gap: 10 (ref 5–15)
BUN: 23 mg/dL — ABNORMAL HIGH (ref 6–20)
CO2: 23 mmol/L (ref 22–32)
Calcium: 8.9 mg/dL (ref 8.9–10.3)
Chloride: 103 mmol/L (ref 98–111)
Creatinine: 0.89 mg/dL (ref 0.61–1.24)
GFR, Estimated: >60 mL/min (ref >60)
Glucose, Bld: 111 mg/dL — ABNORMAL HIGH (ref 70–99)
Potassium: 3.9 mmol/L (ref 3.5–5.1)
Sodium: 136 mmol/L (ref 135–145)
Total Bilirubin: 0.4 mg/dL (ref 0.0–1.2)
Total Protein: 7 g/dL (ref 6.5–8.1)

## 2024-03-09 MED ORDER — ZOLBETUXIMAB-CLZB CHEMO 100MG/5ML IV SOLN
793.0000 mg/m2 | Freq: Once | INTRAVENOUS | Status: AC
  Administered 2024-03-09: 1600 mg via INTRAVENOUS
  Filled 2024-03-09: qty 80

## 2024-03-09 MED ORDER — PALONOSETRON HCL INJECTION 0.25 MG/5ML
0.2500 mg | Freq: Once | INTRAVENOUS | Status: AC
  Administered 2024-03-09: 0.25 mg via INTRAVENOUS
  Filled 2024-03-09: qty 5

## 2024-03-09 MED ORDER — HEPARIN SOD (PORK) LOCK FLUSH 100 UNIT/ML IV SOLN
500.0000 [IU] | Freq: Once | INTRAVENOUS | Status: AC | PRN
  Administered 2024-03-09: 500 [IU]

## 2024-03-09 MED ORDER — OLANZAPINE 5 MG PO TABS: 5.0000 mg | ORAL_TABLET | ORAL | 2 refills | Status: DC

## 2024-03-09 MED ORDER — SCOPOLAMINE 1 MG/3DAYS TD PT72: 1.0000 | MEDICATED_PATCH | TRANSDERMAL | 2 refills | Status: DC

## 2024-03-09 MED ORDER — OLANZAPINE 5 MG PO TABS
5.0000 mg | ORAL_TABLET | Freq: Once | ORAL | Status: AC
  Administered 2024-03-09: 5 mg via ORAL
  Filled 2024-03-09: qty 1

## 2024-03-09 MED ORDER — SODIUM CHLORIDE 0.9 % IV SOLN
INTRAVENOUS | Status: DC
Start: 2024-03-09 — End: 2024-03-09

## 2024-03-09 MED ORDER — DEXAMETHASONE 4 MG PO TABS: 4.0000 mg | ORAL_TABLET | Freq: Every day | ORAL | 2 refills | Status: DC

## 2024-03-09 MED ORDER — PROCHLORPERAZINE MALEATE 10 MG PO TABS
10.0000 mg | ORAL_TABLET | Freq: Four times a day (QID) | ORAL | Status: DC | PRN
  Administered 2024-03-09: 10 mg via ORAL
  Filled 2024-03-09: qty 1

## 2024-03-09 MED ORDER — DEXAMETHASONE SODIUM PHOSPHATE 10 MG/ML IJ SOLN
10.0000 mg | Freq: Once | INTRAMUSCULAR | Status: AC
  Administered 2024-03-09: 10 mg via INTRAVENOUS
  Filled 2024-03-09: qty 1

## 2024-03-09 MED ORDER — SODIUM CHLORIDE 0.9 % IV SOLN
150.0000 mg | Freq: Once | INTRAVENOUS | Status: AC
  Administered 2024-03-09: 150 mg via INTRAVENOUS
  Filled 2024-03-09: qty 150

## 2024-03-09 MED ORDER — SODIUM CHLORIDE 0.9% FLUSH
10.0000 mL | INTRAVENOUS | Status: DC | PRN
Start: 2024-03-09 — End: 2024-03-09
  Administered 2024-03-09: 10 mL

## 2024-03-09 NOTE — Progress Notes (Signed)
 [facility] CSW Progress Note  Visual merchandiser met with patient and spouse as planned.   CSW scanned over SNAP documentation to patient financial specialist.  CSW sent follow up email to Patient financial Specialist for updates regarding Careers information officer.  CSW / Patient / Spouse reviewed and completed documentation for SSA application. CSW contacted servant center on patient's behalf with question.  CSW provided food bag as requested by Dietician.   Follow up at next infusion.  Marguerita Merles, LCSW Clinical Social Worker [facility]

## 2024-03-09 NOTE — Patient Instructions (Signed)
 [facility]  Discharge Instructions: Thank you for choosing [facility] to provide your oncology and hematology care.   If you have a lab appointment with the Cancer Center, please go directly to the Cancer Center and check in at the registration area.   Wear comfortable clothing and clothing appropriate for easy access to any Portacath or PICC line.   We strive to give you quality time with your provider. You may need to reschedule your appointment if you arrive late (15 or more minutes).  Arriving late affects you and other patients whose appointments are after yours.  Also, if you miss three or more appointments without notifying the office, you may be dismissed from the clinic at the provider's discretion.      For prescription refill requests, have your pharmacy contact our office and allow 72 hours for refills to be completed.    Today you received the following chemotherapy and/or immunotherapy agents VYLOY.  Zolbetuximab Injection What is this medication? ZOLBETUXIMAB (ZOL be TUX i mab) treats stomach cancer. It works by blocking a protein that causes cancer cells to grow and multiply. This helps to slow or stop the spread of cancer cells. It is a monoclonal antibody. This medicine may be used for other purposes; ask your health care provider or pharmacist if you have questions. COMMON BRAND NAME(S): Vyloy What should I tell my care team before I take this medication? They need to know if you have any of these conditions: Nausea or vomiting An unusual or allergic reaction to zolbetuximab, other medications, foods, dyes, or preservatives Pregnant or trying to get pregnant Breastfeeding How should I use this medication? This medication is infused into a vein. It is given by your care team in a hospital or clinic setting. Talk to your care team about the use of this medication in children. Special care may be  needed. Overdosage: If you think you have taken too much of this medicine contact a poison control center or emergency room at once. NOTE: This medicine is only for you. Do not share this medicine with others. What if I miss a dose? Keep appointments for follow-up doses. It is important not to miss your dose. Call your care team if you are unable to keep an appointment. What may interact with this medication? Interactions have not been studied. This list may not describe all possible interactions. Give your health care provider a list of all the medicines, herbs, non-prescription drugs, or dietary supplements you use. Also tell them if you smoke, drink alcohol, or use illegal drugs. Some items may interact with your medicine. What should I watch for while using this medication? Your condition will be monitored carefully while you are receiving this medication. You may need blood work done while you are taking this medication. This medication can cause serious infusion reactions. To reduce the risk, your care team may give you other medications to take before receiving this one. Follow the directions from your care team. Do not breastfeed while taking this medication and for 8 months after the last dose. What side effects may I notice from receiving this medication? Side effects that you should report to your care team as soon as possible: Allergic reactions--skin rash, itching, hives, swelling of the face, lips, tongue, or throat Infusion reactions--chest pain, shortness of breath or trouble breathing, feeling faint or lightheaded Side effects that usually do not require medical attention (report these  to your care team if they continue or are bothersome): Diarrhea Fatigue Loss of appetite with weight loss Nausea Pain, tingling, or numbness in the hands or feet Swelling of the ankles, hands, or feet Vomiting This list may not describe all possible side effects. Call your doctor for medical  advice about side effects. You may report side effects to FDA at 1-800-FDA-1088. Where should I keep my medication? This medication is given in a hospital or clinic. It will not be stored at home. NOTE: This sheet is a summary. It may not cover all possible information. If you have questions about this medicine, talk to your doctor, pharmacist, or health care provider.  2024 Elsevier/Gold Standard (2023-11-05 00:00:00)   To help prevent nausea and vomiting after your treatment, we encourage you to take your nausea medication as directed.  BELOW ARE SYMPTOMS THAT SHOULD BE REPORTED IMMEDIATELY: *FEVER GREATER THAN 100.4 F (38 C) OR HIGHER *CHILLS OR SWEATING *NAUSEA AND VOMITING THAT IS NOT CONTROLLED WITH YOUR NAUSEA MEDICATION *UNUSUAL SHORTNESS OF BREATH *UNUSUAL BRUISING OR BLEEDING *URINARY PROBLEMS (pain or burning when urinating, or frequent urination) *BOWEL PROBLEMS (unusual diarrhea, constipation, pain near the anus) TENDERNESS IN MOUTH AND THROAT WITH OR WITHOUT PRESENCE OF ULCERS (sore throat, sores in mouth, or a toothache) UNUSUAL RASH, SWELLING OR PAIN  UNUSUAL VAGINAL DISCHARGE OR ITCHING   Items with * indicate a potential emergency and should be followed up as soon as possible or go to the Emergency Department if any problems should occur.  Please show the CHEMOTHERAPY ALERT CARD or IMMUNOTHERAPY ALERT CARD at check-in to the Emergency Department and triage nurse.  Should you have questions after your visit or need to cancel or reschedule your appointment, please contact [facility]  Dept: [phone]  and follow the prompts.  Office hours are 8:00 a.m. to 4:30 p.m. Monday - Friday. Please note that voicemails left after 4:00 p.m. may not be returned until the following business day.  We are closed weekends and major holidays. You have access to a nurse at all times for urgent questions. Please call the main number  to the clinic Dept: [phone] and follow the prompts.   For any non-urgent questions, you may also contact your provider using MyChart. We now offer e-Visits for anyone 60 and older to request care online for non-urgent symptoms. For details visit [email or web address].   Also download the MyChart app! Go to the app store, search "MyChart", open the app, select [place], and log in with your MyChart username and password.

## 2024-03-09 NOTE — Progress Notes (Signed)
 Patient presents today for first time chemotherapy infusion of VYLOY. Patient is in satisfactory condition with no new complaints voiced.  Vital signs are stable.  Consent signed. Labs reviewed by Dr. Truett Perna during the office visit and all labs are within treatment parameters.  We will proceed with treatment per MD orders.   1058-VYLOY infusion started at 48mL/hr and will increase by 13ml/hr every 30 minutes as tolerated per consult recommendations with Delorise Shiner [facility]   1228-infusion paused at 11ml/hr due to the complaint of nausea. Dr Truett Perna made aware and compazine ordered.  1245-compazine given, will wait 30 minutes to restart treatment.   1315-patient denies nausea, VSS, will restart treatment at 30ml/hr  1630-infusion stopped due to medication expiration time. Approx 50mL left in infusion bag. VSS. Patient alert and oriented. No complaints at this time, will continue to monitor 2hrs for any reaction.

## 2024-03-09 NOTE — Progress Notes (Signed)
 [facility] OFFICE PROGRESS NOTE   Diagnosis: Gastric cancer  INTERVAL HISTORY:   Henry Diffley completed a cycle of oxaliplatin on 02/23/2024.  He had cold sensitivity following chemotherapy.  This has resolved.  No peripheral numbness.  No recurrent confusion.  He generally feels well.  He is exercising.  Good appetite.  He develops postprandial cramping with certain foods.  No bone pain following Udenyca  Objective:  Vital signs in last 24 hours:  Blood pressure 111/72, pulse 64, temperature 98.1 F (36.7 C), temperature source Temporal, resp. rate 18, height 6' (1.829 m), weight 176 lb 6.4 oz (80 kg), SpO2 100%.    HEENT: Erythema at the palate and upper pharynx, no discrete ulcers.  No thrush Resp: Lungs clear bilaterally Cardio: Regular rate and rhythm GI: No hepatosplenomegaly, firmness throughout the abdominal wall without a discrete mass, no apparent ascites Vascular: No leg edema    Portacath/PICC-without erythema  Lab Results:  Lab Results  Component Value Date   WBC 12.0 (H) 03/09/2024   HGB 13.5 03/09/2024   HCT 40.4 03/09/2024   MCV 88.8 03/09/2024   PLT 221 03/09/2024   NEUTROABS 9.1 (H) 03/09/2024    CMP  Lab Results  Component Value Date   NA 136 03/09/2024   K 3.9 03/09/2024   CL 103 03/09/2024   CO2 23 03/09/2024   GLUCOSE 111 (H) 03/09/2024   BUN 23 (H) 03/09/2024   CREATININE 0.89 03/09/2024   CALCIUM 8.9 03/09/2024   PROT 7.0 03/09/2024   ALBUMIN 4.0 03/09/2024   AST 32 03/09/2024   ALT 60 (H) 03/09/2024   ALKPHOS 173 (H) 03/09/2024   BILITOT 0.4 03/09/2024   GFRNONAA >60 03/09/2024   GFRAA >60 09/03/2020    Lab Results  Component Value Date   CEA 3.57 01/15/2024   Medications: I have reviewed the patient's current medications.   Assessment/Plan: Gastric cancer 11/21/2023 EGD-gastric body with "infiltrative looking lesion"; biopsy shows involvement of a hypercellular lesion that suggests diffuse carcinoma by  morphology CTs abdomen/pelvis 11/28/2023-diffuse fatty infiltration of the liver; gastric wall thickening; lymph nodes up to 6 mm in diameter near the stomach wall. 12/31/2023 upper endoscopy-large diffuse friable infiltrative polypoid and ulcerated circumferential mass found in the gastric body.  Scope was passed beyond the mass with normal antrum/pylorus.  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  Negative for HER2 (1+); mismatch repair protein IHC normal; PD-L1 CPS 0%; CLDN18 positive: 90% of tumor cells with 2+/3+ membrane staining PET scan 01/07/2024-circumferential hypermetabolic gastric mucosal thickening.  Ill-defined hypermetabolic lymph nodes in the gastrohepatic ligament.  Intense hypermetabolic activity associated with the right lobe of the thyroid gland.  Small hypermetabolic right axillary node favored reactive. 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  1.1 cm thyroid isthmus nodule meets criteria for 1 year follow-up ultrasound. Admission 02/01/2024 with increased abdominal pain/ascites  Hospitalization with  altered mental status 02/05/2024 through 02/13/2024; question rare case of 5-FU psychosis.  Improved 02/16/2024.  Mental status at baseline 02/23/2024. Neutropenia 02/16/2024 Mucositis 02/16/2024.  Resolved 02/23/2024 Right knee pain/edema/erythema 03/01/2024-Doppler study negative for DVT History of gout   Disposition: Henry May tolerated cycle 2 oxaliplatin well.  No recurrent confusion.  His performance status appears to be excellent.  He will complete another cycle of oxaliplatin today.  Zolbetuximab will be added to the treatment regimen today.  We reviewed potential toxicities associated with zolbetuximab including the chance of nausea.  He agrees to proceed.  He will return for an office visit and chemotherapy in 3 weeks.  We will plan for a restaging CT after cycle 4 or 5.    Thornton Papas, MD  03/09/2024  9:04 AM

## 2024-03-09 NOTE — Telephone Encounter (Signed)
 Pt was referred by Marguerita Merles, LCSW Clinical Social Worker for possible assistance of the [employer].  I spoke w pt's wife, Leonie Douglas today.  Based on the information obtain via ph, pt meets eligibility requirements and the wife will bring in the correspondence needed to continue with approval.

## 2024-03-09 NOTE — Progress Notes (Signed)
 Patient seen by Dr. Thornton Papas today  Vitals are within treatment parameters:Yes   Labs are within treatment parameters: Yes   Treatment plan has been signed: Yes   Per physician team, Proceed with treatment with the following modifications:  Oxaliplatin dose changed to 170 mg based on weight decrease.

## 2024-03-10 ENCOUNTER — Inpatient Hospital Stay

## 2024-03-10 ENCOUNTER — Other Ambulatory Visit: Payer: Self-pay

## 2024-03-10 VITALS — BP 124/65 | HR 52 | Temp 97.8°F

## 2024-03-10 DIAGNOSIS — Z5112 Encounter for antineoplastic immunotherapy: Secondary | ICD-10-CM | POA: Diagnosis not present

## 2024-03-10 DIAGNOSIS — C169 Malignant neoplasm of stomach, unspecified: Secondary | ICD-10-CM

## 2024-03-10 MED ORDER — DEXAMETHASONE SODIUM PHOSPHATE 10 MG/ML IJ SOLN
10.0000 mg | Freq: Once | INTRAMUSCULAR | Status: AC
  Administered 2024-03-10: 10 mg via INTRAVENOUS
  Filled 2024-03-10: qty 1

## 2024-03-10 MED ORDER — HEPARIN SOD (PORK) LOCK FLUSH 100 UNIT/ML IV SOLN
500.0000 [IU] | Freq: Once | INTRAVENOUS | Status: AC
  Administered 2024-03-10: 500 [IU] via INTRAVENOUS

## 2024-03-10 MED ORDER — OXALIPLATIN CHEMO INJECTION 100 MG/20ML
85.0000 mg/m2 | Freq: Once | INTRAVENOUS | Status: AC
  Administered 2024-03-10: 170 mg via INTRAVENOUS
  Filled 2024-03-10: qty 34

## 2024-03-10 MED ORDER — DEXTROSE 5 % IV SOLN: INTRAVENOUS | Status: DC

## 2024-03-10 MED ORDER — SODIUM CHLORIDE 0.9% FLUSH
10.0000 mL | Freq: Once | INTRAVENOUS | Status: AC
  Administered 2024-03-10: 10 mL via INTRAVENOUS

## 2024-03-10 NOTE — Patient Instructions (Signed)
 [facility] - A DEPT OF [facility]   Discharge Instructions: Thank you for choosing [facility] to provide your oncology and hematology care.   If you have a lab appointment with the Cancer Center, please go directly to the Cancer Center and check in at the registration area.   Wear comfortable clothing and clothing appropriate for easy access to any Portacath or PICC line.   We strive to give you quality time with your provider. You may need to reschedule your appointment if you arrive late (15 or more minutes).  Arriving late affects you and other patients whose appointments are after yours.  Also, if you miss three or more appointments without notifying the office, you may be dismissed from the clinic at the provider's discretion.      For prescription refill requests, have your pharmacy contact our office and allow 72 hours for refills to be completed.    Today you received the following chemotherapy and/or immunotherapy agents OXALIPLATIN      To help prevent nausea and vomiting after your treatment, we encourage you to take your nausea medication as directed.  BELOW ARE SYMPTOMS THAT SHOULD BE REPORTED IMMEDIATELY: *FEVER GREATER THAN 100.4 F (38 C) OR HIGHER *CHILLS OR SWEATING *NAUSEA AND VOMITING THAT IS NOT CONTROLLED WITH YOUR NAUSEA MEDICATION *UNUSUAL SHORTNESS OF BREATH *UNUSUAL BRUISING OR BLEEDING *URINARY PROBLEMS (pain or burning when urinating, or frequent urination) *BOWEL PROBLEMS (unusual diarrhea, constipation, pain near the anus) TENDERNESS IN MOUTH AND THROAT WITH OR WITHOUT PRESENCE OF ULCERS (sore throat, sores in mouth, or a toothache) UNUSUAL RASH, SWELLING OR PAIN  UNUSUAL VAGINAL DISCHARGE OR ITCHING   Items with * indicate a potential emergency and should be followed up as soon as possible or go to the Emergency Department if any problems should occur.  Please show the CHEMOTHERAPY ALERT CARD or  IMMUNOTHERAPY ALERT CARD at check-in to the Emergency Department and triage nurse.  Should you have questions after your visit or need to cancel or reschedule your appointment, please contact [facility] - A DEPT OF [facility]  Dept: [phone]  and follow the prompts.  Office hours are 8:00 a.m. to 4:30 p.m. Monday - Friday. Please note that voicemails left after 4:00 p.m. may not be returned until the following business day.  We are closed weekends and major holidays. You have access to a nurse at all times for urgent questions. Please call the main number to the clinic Dept: [phone] and follow the prompts.   For any non-urgent questions, you may also contact your provider using MyChart. We now offer e-Visits for anyone 44 and older to request care online for non-urgent symptoms. For details visit [email or web address].   Also download the MyChart app! Go to the app store, search "MyChart", open the app, select [facility], and log in with your MyChart username and password.  Oxaliplatin Injection What is this medication? OXALIPLATIN (ox AL i PLA tin) treats colorectal cancer. It works by slowing down the growth of cancer cells. This medicine may be used for other purposes; ask your health care provider or pharmacist if you have questions. COMMON BRAND NAME(S): Eloxatin What should I tell my care team before I take this medication? They need to know if you have any of these conditions: Heart disease History of irregular heartbeat or rhythm Liver disease Low blood cell levels (white cells, red cells, and platelets) Lung or breathing disease,  such as asthma Take medications that treat or prevent blood clots Tingling of the fingers, toes, or other nerve disorder An unusual or allergic reaction to oxaliplatin, other medications, foods, dyes, or preservatives If you or your partner are pregnant or trying to get pregnant Breast-feeding How should I use  this medication? This medication is injected into a vein. It is given by your care team in a hospital or clinic setting. Talk to your care team about the use of this medication in children. Special care may be needed. Overdosage: If you think you have taken too much of this medicine contact a poison control center or emergency room at once. NOTE: This medicine is only for you. Do not share this medicine with others. What if I miss a dose? Keep appointments for follow-up doses. It is important not to miss a dose. Call your care team if you are unable to keep an appointment. What may interact with this medication? Do not take this medication with any of the following: Cisapride Dronedarone Pimozide Thioridazine This medication may also interact with the following: Aspirin and aspirin-like medications Certain medications that treat or prevent blood clots, such as warfarin, apixaban, dabigatran, and rivaroxaban Cisplatin Cyclosporine Diuretics Medications for infection, such as acyclovir, adefovir, amphotericin B, bacitracin, cidofovir, foscarnet, ganciclovir, gentamicin, pentamidine, vancomycin NSAIDs, medications for pain and inflammation, such as ibuprofen or naproxen Other medications that cause heart rhythm changes Pamidronate Zoledronic acid This list may not describe all possible interactions. Give your health care provider a list of all the medicines, herbs, non-prescription drugs, or dietary supplements you use. Also tell them if you smoke, drink alcohol, or use illegal drugs. Some items may interact with your medicine. What should I watch for while using this medication? Your condition will be monitored carefully while you are receiving this medication. You may need blood work while taking this medication. This medication may make you feel generally unwell. This is not uncommon as chemotherapy can affect healthy cells as well as cancer cells. Report any side effects. Continue your  course of treatment even though you feel ill unless your care team tells you to stop. This medication may increase your risk of getting an infection. Call your care team for advice if you get a fever, chills, sore throat, or other symptoms of a cold or flu. Do not treat yourself. Try to avoid being around people who are sick. Avoid taking medications that contain aspirin, acetaminophen, ibuprofen, naproxen, or ketoprofen unless instructed by your care team. These medications may hide a fever. Be careful brushing or flossing your teeth or using a toothpick because you may get an infection or bleed more easily. If you have any dental work done, tell your dentist you are receiving this medication. This medication can make you more sensitive to cold. Do not drink cold drinks or use ice. Cover exposed skin before coming in contact with cold temperatures or cold objects. When out in cold weather wear warm clothing and cover your mouth and nose to warm the air that goes into your lungs. Tell your care team if you get sensitive to the cold. Talk to your care team if you or your partner are pregnant or think either of you might be pregnant. This medication can cause serious birth defects if taken during pregnancy and for 9 months after the last dose. A negative pregnancy test is required before starting this medication. A reliable form of contraception is recommended while taking this medication and for 9 months after  the last dose. Talk to your care team about effective forms of contraception. Do not father a child while taking this medication and for 6 months after the last dose. Use a condom while having sex during this time period. Do not breastfeed while taking this medication and for 3 months after the last dose. This medication may cause infertility. Talk to your care team if you are concerned about your fertility. What side effects may I notice from receiving this medication? Side effects that you should  report to your care team as soon as possible: Allergic reactions--skin rash, itching, hives, swelling of the face, lips, tongue, or throat Bleeding--bloody or black, tar-like stools, vomiting blood or brown material that looks like coffee grounds, red or dark brown urine, small red or purple spots on skin, unusual bruising or bleeding Dry cough, shortness of breath or trouble breathing Heart rhythm changes--fast or irregular heartbeat, dizziness, feeling faint or lightheaded, chest pain, trouble breathing Infection--fever, chills, cough, sore throat, wounds that don't heal, pain or trouble when passing urine, general feeling of discomfort or being unwell Liver injury--right upper belly pain, loss of appetite, nausea, light-colored stool, dark yellow or brown urine, yellowing skin or eyes, unusual weakness or fatigue Low red blood cell level--unusual weakness or fatigue, dizziness, headache, trouble breathing Muscle injury--unusual weakness or fatigue, muscle pain, dark yellow or brown urine, decrease in amount of urine Pain, tingling, or numbness in the hands or feet Sudden and severe headache, confusion, change in vision, seizures, which may be signs of posterior reversible encephalopathy syndrome (PRES) Unusual bruising or bleeding Side effects that usually do not require medical attention (report to your care team if they continue or are bothersome): Diarrhea Nausea Pain, redness, or swelling with sores inside the mouth or throat Unusual weakness or fatigue Vomiting This list may not describe all possible side effects. Call your doctor for medical advice about side effects. You may report side effects to FDA at 1-800-FDA-1088. Where should I keep my medication? This medication is given in a hospital or clinic. It will not be stored at home. NOTE: This sheet is a summary. It may not cover all possible information. If you have questions about this medicine, talk to your doctor, pharmacist, or  health care provider.  2024 Elsevier/Gold Standard (2023-11-14 00:00:00)

## 2024-03-10 NOTE — Progress Notes (Signed)
 24 HOUR CALLBACK 24 Hour callback post 1st time [place] infusion. Patient here for oxaliplatin today, reports no side effects or sign of reaction from his treatment yesterday.  Patient reports that he slept well last night and feels well today.

## 2024-03-11 ENCOUNTER — Inpatient Hospital Stay

## 2024-03-11 VITALS — BP 122/72 | HR 52 | Temp 98.0°F | Resp 17

## 2024-03-11 DIAGNOSIS — Z5112 Encounter for antineoplastic immunotherapy: Secondary | ICD-10-CM | POA: Diagnosis not present

## 2024-03-11 DIAGNOSIS — C169 Malignant neoplasm of stomach, unspecified: Secondary | ICD-10-CM

## 2024-03-11 MED ORDER — PEGFILGRASTIM-JMDB 6 MG/0.6ML ~~LOC~~ SOSY
6.0000 mg | PREFILLED_SYRINGE | Freq: Once | SUBCUTANEOUS | Status: AC
  Administered 2024-03-11: 6 mg via SUBCUTANEOUS
  Filled 2024-03-11: qty 0.6

## 2024-03-11 NOTE — Patient Instructions (Signed)

## 2024-03-17 ENCOUNTER — Telehealth: Payer: Self-pay | Admitting: Oncology

## 2024-03-17 NOTE — Progress Notes (Signed)
 [facility] CSW Progress Note  Clinical Social Worker  attempted to reach [employer] again on this date for clarification on document submission. CSW will follow up with patient and spouse once answer is received.    Marguerita Merles, LCSW Clinical Social Worker [facility]

## 2024-03-17 NOTE — Telephone Encounter (Signed)
 [employer] APPROVED for $1000 FAMILY SIZE:  3 HH INC:  100% 250% FPG Annual Met APPROVAL DATE: 03/17/2024

## 2024-03-18 ENCOUNTER — Other Ambulatory Visit: Payer: Self-pay | Admitting: Oncology

## 2024-03-19 ENCOUNTER — Inpatient Hospital Stay: Attending: Nurse Practitioner

## 2024-03-19 ENCOUNTER — Inpatient Hospital Stay: Payer: No Typology Code available for payment source | Admitting: Genetic Counselor

## 2024-03-19 ENCOUNTER — Inpatient Hospital Stay: Payer: No Typology Code available for payment source

## 2024-03-19 DIAGNOSIS — R110 Nausea: Secondary | ICD-10-CM | POA: Insufficient documentation

## 2024-03-19 DIAGNOSIS — C162 Malignant neoplasm of body of stomach: Secondary | ICD-10-CM | POA: Insufficient documentation

## 2024-03-19 DIAGNOSIS — Z5111 Encounter for antineoplastic chemotherapy: Secondary | ICD-10-CM | POA: Insufficient documentation

## 2024-03-19 NOTE — Progress Notes (Signed)
 [facility] CSW Progress Note  Clinical Child psychotherapist contacted patient by phone as requested by patient. Patient had questions about next steps for [employer] to assist with Mortgage .  CSW confirmed documents were sent to Patient Financial Specialist T. Chilton Si. CSW contacted T. Chilton Si and asked if documents can be signed on Monday. T.Green will reach out to patient and family to assist with next steps. CSW will see patient at next infusion.   Marguerita Merles, LCSW Clinical Social Worker [facility]

## 2024-03-22 ENCOUNTER — Encounter: Payer: Self-pay | Admitting: Nurse Practitioner

## 2024-03-22 ENCOUNTER — Inpatient Hospital Stay

## 2024-03-22 ENCOUNTER — Inpatient Hospital Stay (HOSPITAL_BASED_OUTPATIENT_CLINIC_OR_DEPARTMENT_OTHER): Admitting: Nurse Practitioner

## 2024-03-22 VITALS — BP 109/75 | HR 66 | Temp 98.1°F | Resp 16 | Ht 72.0 in | Wt 179.9 lb

## 2024-03-22 DIAGNOSIS — C169 Malignant neoplasm of stomach, unspecified: Secondary | ICD-10-CM

## 2024-03-22 DIAGNOSIS — C162 Malignant neoplasm of body of stomach: Secondary | ICD-10-CM | POA: Diagnosis present

## 2024-03-22 DIAGNOSIS — Z5111 Encounter for antineoplastic chemotherapy: Secondary | ICD-10-CM | POA: Diagnosis present

## 2024-03-22 DIAGNOSIS — R110 Nausea: Secondary | ICD-10-CM | POA: Diagnosis not present

## 2024-03-22 LAB — CBC WITH DIFFERENTIAL (CANCER CENTER ONLY)
Abs Immature Granulocytes: 0.25 10*3/uL — ABNORMAL HIGH (ref 0.00–0.07)
Basophils Absolute: 0.1 10*3/uL (ref 0.0–0.1)
Basophils Relative: 1 %
Eosinophils Absolute: 0.4 10*3/uL (ref 0.0–0.5)
Eosinophils Relative: 2 %
HCT: 38.3 % — ABNORMAL LOW (ref 39.0–52.0)
Hemoglobin: 12.6 g/dL — ABNORMAL LOW (ref 13.0–17.0)
Immature Granulocytes: 2 %
Lymphocytes Relative: 10 %
Lymphs Abs: 1.8 10*3/uL (ref 0.7–4.0)
MCH: 29.8 pg (ref 26.0–34.0)
MCHC: 32.9 g/dL (ref 30.0–36.0)
MCV: 90.5 fL (ref 80.0–100.0)
Monocytes Absolute: 1 10*3/uL (ref 0.1–1.0)
Monocytes Relative: 6 %
Neutro Abs: 13.5 10*3/uL — ABNORMAL HIGH (ref 1.7–7.7)
Neutrophils Relative %: 79 %
Platelet Count: 155 10*3/uL (ref 150–400)
RBC: 4.23 MIL/uL (ref 4.22–5.81)
RDW: 17.5 % — ABNORMAL HIGH (ref 11.5–15.5)
WBC Count: 17.1 10*3/uL — ABNORMAL HIGH (ref 4.0–10.5)
nRBC: 0 % (ref 0.0–0.2)

## 2024-03-22 LAB — CMP (CANCER CENTER ONLY)
ALT: 48 U/L — ABNORMAL HIGH (ref 0–44)
AST: 29 U/L (ref 15–41)
Albumin: 3.6 g/dL (ref 3.5–5.0)
Alkaline Phosphatase: 168 U/L — ABNORMAL HIGH (ref 38–126)
Anion gap: 6 (ref 5–15)
BUN: 17 mg/dL (ref 6–20)
CO2: 28 mmol/L (ref 22–32)
Calcium: 8.7 mg/dL — ABNORMAL LOW (ref 8.9–10.3)
Chloride: 105 mmol/L (ref 98–111)
Creatinine: 0.75 mg/dL (ref 0.61–1.24)
GFR, Estimated: >60 mL/min (ref >60)
Glucose, Bld: 80 mg/dL (ref 70–99)
Potassium: 4.1 mmol/L (ref 3.5–5.1)
Sodium: 139 mmol/L (ref 135–145)
Total Bilirubin: 0.3 mg/dL (ref 0.0–1.2)
Total Protein: 6.2 g/dL — ABNORMAL LOW (ref 6.5–8.1)

## 2024-03-22 NOTE — Progress Notes (Signed)
 Henry May   Diagnosis: Gastric cancer  INTERVAL HISTORY:   Henry May returns as scheduled.  He completed a treatment with zolbetuximab 03/09/2024, oxaliplatin 03/10/2024.  He had nausea on day 1 zolbetuximab.  No further nausea.  No mouth sores.  No diarrhea.  No fever.  No rash.  No cough or shortness of breath.  He did not experience cold sensitivity.  No confusion.  He denies pain.  Appetite continues to be improved.  He notes fatigue after eating.  Objective:  Vital signs in last 24 hours:  Blood pressure 109/75, pulse 66, temperature 98.1 F (36.7 C), temperature source Temporal, resp. rate 16, height 6' (1.829 m), weight 179 lb 14.4 oz (81.6 kg), SpO2 100%.    HEENT: No thrush or ulcers. Resp: Lungs clear bilaterally. Cardio: Regular rate and rhythm. GI: No hepatosplenomegaly.  Firm fullness across the upper abdomen.  No apparent ascites. Vascular: No leg edema. Neuro: Alert and oriented. Port-A-Cath without erythema.  Lab Results:  Lab Results  Component Value Date   WBC 12.0 (H) 03/09/2024   HGB 13.5 03/09/2024   HCT 40.4 03/09/2024   MCV 88.8 03/09/2024   PLT 221 03/09/2024   NEUTROABS 9.1 (H) 03/09/2024    Imaging:  No results found.  Medications: I have reviewed the patient's current medications.  Assessment/Plan: Gastric cancer 11/21/2023 EGD-gastric body with "infiltrative looking lesion"; biopsy shows involvement of a hypercellular lesion that suggests diffuse carcinoma by morphology CTs abdomen/pelvis 11/28/2023-diffuse fatty infiltration of the liver; gastric wall thickening; lymph nodes up to 6 mm in diameter near the stomach wall. 12/31/2023 upper endoscopy-large diffuse friable infiltrative polypoid and ulcerated circumferential mass found in the gastric body.  Scope was passed beyond the mass with normal antrum/pylorus.  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  Negative for HER2 (1+); mismatch repair protein IHC normal; PD-L1 CPS 0%; CLDN18 positive: 90% of tumor cells with 2+/3+ membrane staining PET scan 01/07/2024-circumferential hypermetabolic gastric mucosal thickening.  Ill-defined hypermetabolic lymph nodes in the gastrohepatic ligament.  Intense hypermetabolic activity associated with the right lobe of the thyroid gland.  Small hypermetabolic right axillary node favored reactive. 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 following cycle 1 Cycle 2 oxaliplatin 02/23/2024, Udenyca Cycle 3 oxaliplatin, zolbetuximab 03/09/2024, Udenyca Cycle 4 oxaliplatin, zolbetuximab 03/23/2024, Udenyca Early satiety, postprandial abdominal pain, weight loss secondary to #1 History of bilateral lower extremity DVT 2021-anticoagulation discontinued due to massive retroperitoneal bleed, IVC filter placed 08/24/2020 Hospitalized with acute respiratory failure due to COVID-19 08/06/2020 - 09/04/2020 History of massive retroperitoneal bleed secondary to anticoagulation September 2021 Thyroid ultrasound 01/13/2024-no abnormal nodule identified in the right lobe.  1.1 cm thyroid isthmus nodule meets criteria for 1 year follow-up ultrasound. Admission 02/01/2024 with increased abdominal pain/ascites Hospitalization with altered mental status 02/05/2024 through 02/13/2024; question rare case of 5-FU psychosis.  Improved 02/16/2024.  Mental status at baseline 02/23/2024. Neutropenia 02/16/2024 Mucositis 02/16/2024.  Resolved 02/23/2024 Right knee pain/edema/erythema 03/01/2024-Doppler study negative for DVT History of gout  Disposition: Henry May appears stable.  He has completed 3 cycles of oxaliplatin.   Zolbetuximab was added with cycle 3.  Aside from nausea day 1 he seems to be tolerating treatment well.  Plan to proceed with cycle 4 oxaliplatin plus zolbetuximab  as scheduled 03/23/2024.  Labs from today are pending.  He will return for follow-up as scheduled in 2 weeks.  He will contact the office in the interim with any problems.    Henry May ANP/GNP-BC   03/22/2024  11:22 AM

## 2024-03-23 ENCOUNTER — Inpatient Hospital Stay

## 2024-03-23 ENCOUNTER — Other Ambulatory Visit

## 2024-03-23 ENCOUNTER — Ambulatory Visit: Admitting: Nurse Practitioner

## 2024-03-23 VITALS — BP 121/74 | HR 61 | Temp 98.2°F | Resp 16

## 2024-03-23 DIAGNOSIS — Z5111 Encounter for antineoplastic chemotherapy: Secondary | ICD-10-CM | POA: Diagnosis not present

## 2024-03-23 DIAGNOSIS — C169 Malignant neoplasm of stomach, unspecified: Secondary | ICD-10-CM

## 2024-03-23 MED ORDER — OXALIPLATIN CHEMO INJECTION 100 MG/20ML
85.0000 mg/m2 | Freq: Once | INTRAVENOUS | Status: AC
  Administered 2024-03-23: 170 mg via INTRAVENOUS
  Filled 2024-03-23: qty 34

## 2024-03-23 MED ORDER — SODIUM CHLORIDE 0.9 % IV SOLN
150.0000 mg | Freq: Once | INTRAVENOUS | Status: AC
  Administered 2024-03-23: 150 mg via INTRAVENOUS
  Filled 2024-03-23: qty 150

## 2024-03-23 MED ORDER — DEXTROSE 5 % IV SOLN: INTRAVENOUS | Status: DC

## 2024-03-23 MED ORDER — SODIUM CHLORIDE 0.9% FLUSH
10.0000 mL | INTRAVENOUS | Status: DC | PRN
  Administered 2024-03-23: 10 mL

## 2024-03-23 MED ORDER — HEPARIN SOD (PORK) LOCK FLUSH 100 UNIT/ML IV SOLN
500.0000 [IU] | Freq: Once | INTRAVENOUS | Status: AC | PRN
  Administered 2024-03-23: 500 [IU]

## 2024-03-23 MED ORDER — ZOLBETUXIMAB-CLZB CHEMO 100MG/5ML IV SOLN
395.0000 mg/m2 | Freq: Once | INTRAVENOUS | Status: AC
  Administered 2024-03-23: 800 mg via INTRAVENOUS
  Filled 2024-03-23: qty 40

## 2024-03-23 MED ORDER — DEXAMETHASONE SODIUM PHOSPHATE 10 MG/ML IJ SOLN
10.0000 mg | Freq: Once | INTRAMUSCULAR | Status: AC
Start: 2024-03-23 — End: 2024-03-23
  Administered 2024-03-23: 10 mg via INTRAVENOUS
  Filled 2024-03-23: qty 1

## 2024-03-23 MED ORDER — SODIUM CHLORIDE 0.9 % IV SOLN: INTRAVENOUS | Status: DC

## 2024-03-23 MED ORDER — PALONOSETRON HCL INJECTION 0.25 MG/5ML
0.2500 mg | Freq: Once | INTRAVENOUS | Status: AC
Start: 2024-03-23 — End: 2024-03-23
  Administered 2024-03-23: 0.25 mg via INTRAVENOUS
  Filled 2024-03-23: qty 5

## 2024-03-23 MED ORDER — OLANZAPINE 5 MG PO TABS
5.0000 mg | ORAL_TABLET | Freq: Once | ORAL | Status: AC
  Administered 2024-03-23: 5 mg via ORAL
  Filled 2024-03-23: qty 1

## 2024-03-23 NOTE — Progress Notes (Signed)
 [facility] CSW Progress Note  Visual merchandiser met with patient and spouse on this date. Patient and family's primary need is financial assistance.   CSW submitted needed documentation for the Adela Ports to Patient financial specialist T.Green CSW sent email to local organization to for update of assistance available to family. CSW contacted [facility] billing department for updated amount.  CSW contacted patient's insurance company to confirm patient met OOP (on date 03/04/2024). CSW discussed [facility]'s Kathreen Cornfield to aide in assistance for May.  Patient and Family continue to wait for updates on disability application.   CSW provided direct contact for [facility] Case Worker for patient and spouse to call. CSW will continue to follow family.  Marguerita Merles, LCSW Clinical Social Worker [facility]

## 2024-03-24 ENCOUNTER — Other Ambulatory Visit: Payer: Self-pay

## 2024-03-25 ENCOUNTER — Inpatient Hospital Stay

## 2024-03-25 ENCOUNTER — Other Ambulatory Visit: Payer: Self-pay

## 2024-03-25 VITALS — BP 102/66 | HR 51 | Temp 97.7°F | Resp 18

## 2024-03-25 DIAGNOSIS — Z5111 Encounter for antineoplastic chemotherapy: Secondary | ICD-10-CM | POA: Diagnosis not present

## 2024-03-25 DIAGNOSIS — C169 Malignant neoplasm of stomach, unspecified: Secondary | ICD-10-CM

## 2024-03-25 MED ORDER — PEGFILGRASTIM-JMDB 6 MG/0.6ML ~~LOC~~ SOSY
6.0000 mg | PREFILLED_SYRINGE | Freq: Once | SUBCUTANEOUS | Status: AC
  Administered 2024-03-25: 6 mg via SUBCUTANEOUS
  Filled 2024-03-25: qty 0.6

## 2024-03-25 NOTE — Patient Instructions (Signed)
 Pegfilgrastim Injection Qu es este medicamento? El PEGFILGRASTIM disminuye el riesgo de infeccin en personas que estn recibiendo quimioterapia. Acta ayudando al cuerpo a producir ms glbulos blancos, que protegen al cuerpo contra las infecciones. Tambin podra usarse para ayudar a las personas que han estado expuestas a dosis elevadas de radiacin. Este medicamento puede ser utilizado para otros usos; si tiene alguna pregunta consulte con su proveedor de atencin mdica o con su Social research officer, government. MARCAS COMUNES: Fulphila, [place], Neulasta, Nyvepria, Stimufend, UDENYCA, UDENYCA ONBODY, Ziextenzo Qu le debo informar a mi profesional de la salud antes de tomar este medicamento? Necesitan saber si usted presenta alguno de los siguientes problemas o situaciones: Enfermedad renal Alergia al ltex Terapia de radiacin en curso Enfermedad de clulas falciformes Reacciones en la piel a los [employer] acrlicos (inyector en el cuerpo solamente) Una reaccin alrgica o inusual al pegfilgrastim, al filgrastim, a otros medicamentos, alimentos, colorantes o conservantes Si est embarazada o buscando quedar embarazada Si est amamantando a un beb Cmo debo [employer] Corporation? Este medicamento se inyecta debajo de la piel. Si recibe [employer] Corporation en su casa, le ensearn cmo preparar y Building services engineer la jeringa prellenada o cmo usar el inyector en el cuerpo. Consulte las instrucciones de uso para el paciente para obtener instrucciones detalladas. Use el medicamento exactamente como se le indique. Informe de inmediato a su equipo de atencin si sospecha que su inyector en el cuerpo puede haber funcionado incorrectamente o si sospecha que el uso de su inyector en el cuerpo hizo que usted recibiera una dosis parcial o no recibiera una dosis. Es importante que deseche las agujas y las jeringas usadas en un recipiente resistente a los pinchazos. No las deseche en la basura. Si no tiene un recipiente  resistente a los pinchazos, llame a su farmacutico o a su equipo de atencin para obtenerlo. Hable con su equipo de atencin sobre el uso de este medicamento en nios. Aunque este medicamento se puede recetar para ciertas afecciones, existen precauciones que deben tomarse. Sobredosis: Pngase en contacto inmediatamente con un centro toxicolgico o una sala de urgencia si usted cree que haya tomado demasiado medicamento.<br>ATENCIN: [employer] es solo para usted. No comparta este medicamento con nadie. Qu sucede si me olvido de una dosis? Es importante no olvidar ninguna dosis. Llame a su equipo de atencin si olvid una dosis. Si olvida una dosis debido a Building control surveyor o fuga del Hydrographic surveyor en el cuerpo, se deber administrar una nueva dosis tan pronto como sea posible usando una nica jeringa prellenada para uso manual. Qu puede interactuar con este medicamento? No se han estudiado las interacciones. Puede ser que esta lista no menciona todas las posibles interacciones. Informe a su profesional de [employer] de [employer] productos a base de hierbas, medicamentos de [place] o suplementos nutritivos que est tomando. Si usted fuma, consume bebidas alcohlicas o si utiliza drogas ilegales, indqueselo tambin a su profesional de [employer]. Algunas sustancias pueden interactuar con su medicamento. A qu debo estar atento al usar PPL Corporation? Se supervisar su estado de salud atentamente mientras reciba este medicamento. Usted podra necesitar realizarse [employer] Corporation de sangre mientras est usando [place]. Hable con su equipo de atencin sobre su riesgo de cncer. Usted podra tener mayor riesgo de desarrollar ciertos tipos de cncer si [place] este medicamento. Si va a someterse a una IRM (MRI), tomografa computarizada u otro procedimiento, informe a su equipo de atencin que est usando este medicamento (inyector en el cuerpo solamente). Qu efectos secundarios puedo  tener al [employer] este  medicamento? Efectos secundarios que debe informar a su equipo de atencin tan pronto como sea posible: Reacciones alrgicas: erupcin cutnea, comezn/picazn, urticaria, hinchazn de la cara, los labios, la lengua o la garganta S[place]rome de fuga capilar: Theme park manager o muscular, debilidad o fatiga inusuales, sensacin de desmayo o aturdimiento, disminucin de la cantidad de [place], hinchazn de los tobillos, las manos o los pies, dificultad respiratoria Nivel elevado de glbulos blancos: fiebre, fatiga, dificultad respiratoria, sudoracin nocturna, cambio en la visin, prdida de peso Inflamacin de la aorta: fiebre, fatiga, dolor de espalda, pecho o estmago, dolor de cabeza intenso Lesin renal (glomerulonefritis): disminucin de la cantidad de [place], [place] roja o [place], orina con espuma o burbujas, hinchazn de los tobillos, las manos o los pies Falta de aire o problemas para respirar Lesin en el bazo: Engineer, mining en la parte superior izquierda del abdomen u hombro Sangrado o moretones inusuales Efectos secundarios que generalmente no requieren atencin mdica (debe informarlos a su equipo de atencin si persisten o si son molestos): Dolor de Database administrator en las manos o los pies Puede ser que esta lista no menciona todos los posibles efectos secundarios. Comunquese a su mdico por asesoramiento mdico [employer]. Usted puede informar los efectos secundarios a la FDA por telfono al 1-800-FDA-1088. Dnde debo guardar mi medicina? Mantenga fuera del alcance de los nios. Si est [employer]F Corporation en su casa, le indicarn cmo guardarlo. Deseche todo el medicamento que no haya utilizado despus de la fecha de vencimiento indicada en la etiqueta. ATENCIN: Este folleto es un resumen. Puede ser que no cubra toda la posible informacin. Si usted tiene preguntas acerca de esta medicina, consulte con su mdico, su farmacutico o su profesional de Radiographer, therapeutic.  2024  Elsevier/Gold Standard (2023-01-07 00:00:00)

## 2024-04-01 ENCOUNTER — Other Ambulatory Visit: Payer: Self-pay | Admitting: Oncology

## 2024-04-05 ENCOUNTER — Inpatient Hospital Stay

## 2024-04-05 ENCOUNTER — Encounter: Payer: Self-pay | Admitting: Oncology

## 2024-04-05 ENCOUNTER — Inpatient Hospital Stay (HOSPITAL_BASED_OUTPATIENT_CLINIC_OR_DEPARTMENT_OTHER): Admitting: Oncology

## 2024-04-05 ENCOUNTER — Other Ambulatory Visit: Payer: Self-pay | Admitting: *Deleted

## 2024-04-05 VITALS — BP 112/78 | HR 58 | Temp 98.1°F | Resp 18 | Ht 72.0 in | Wt 186.2 lb

## 2024-04-05 DIAGNOSIS — C169 Malignant neoplasm of stomach, unspecified: Secondary | ICD-10-CM

## 2024-04-05 DIAGNOSIS — Z5111 Encounter for antineoplastic chemotherapy: Secondary | ICD-10-CM | POA: Diagnosis not present

## 2024-04-05 LAB — CBC WITH DIFFERENTIAL (CANCER CENTER ONLY)
Abs Immature Granulocytes: 0.09 10*3/uL — ABNORMAL HIGH (ref 0.00–0.07)
Basophils Absolute: 0.1 10*3/uL (ref 0.0–0.1)
Basophils Relative: 1 %
Eosinophils Absolute: 0.4 10*3/uL (ref 0.0–0.5)
Eosinophils Relative: 4 %
HCT: 36.7 % — ABNORMAL LOW (ref 39.0–52.0)
Hemoglobin: 12 g/dL — ABNORMAL LOW (ref 13.0–17.0)
Immature Granulocytes: 1 %
Lymphocytes Relative: 13 %
Lymphs Abs: 1.4 10*3/uL (ref 0.7–4.0)
MCH: 30.2 pg (ref 26.0–34.0)
MCHC: 32.7 g/dL (ref 30.0–36.0)
MCV: 92.2 fL (ref 80.0–100.0)
Monocytes Absolute: 0.8 10*3/uL (ref 0.1–1.0)
Monocytes Relative: 7 %
Neutro Abs: 7.8 10*3/uL — ABNORMAL HIGH (ref 1.7–7.7)
Neutrophils Relative %: 74 %
Platelet Count: 178 10*3/uL (ref 150–400)
RBC: 3.98 MIL/uL — ABNORMAL LOW (ref 4.22–5.81)
RDW: 19.5 % — ABNORMAL HIGH (ref 11.5–15.5)
WBC Count: 10.7 10*3/uL — ABNORMAL HIGH (ref 4.0–10.5)
nRBC: 0 % (ref 0.0–0.2)

## 2024-04-05 LAB — CMP (CANCER CENTER ONLY)
ALT: 33 U/L (ref 0–44)
AST: 25 U/L (ref 15–41)
Albumin: 3.7 g/dL (ref 3.5–5.0)
Alkaline Phosphatase: 154 U/L — ABNORMAL HIGH (ref 38–126)
Anion gap: 6 (ref 5–15)
BUN: 16 mg/dL (ref 6–20)
CO2: 26 mmol/L (ref 22–32)
Calcium: 8.7 mg/dL — ABNORMAL LOW (ref 8.9–10.3)
Chloride: 108 mmol/L (ref 98–111)
Creatinine: 0.84 mg/dL (ref 0.61–1.24)
GFR, Estimated: >60 mL/min (ref >60)
Glucose, Bld: 84 mg/dL (ref 70–99)
Potassium: 4 mmol/L (ref 3.5–5.1)
Sodium: 140 mmol/L (ref 135–145)
Total Bilirubin: 0.3 mg/dL (ref 0.0–1.2)
Total Protein: 6 g/dL — ABNORMAL LOW (ref 6.5–8.1)

## 2024-04-05 MED ORDER — SCOPOLAMINE 1 MG/3DAYS TD PT72: 1.0000 | MEDICATED_PATCH | TRANSDERMAL | 2 refills | Status: DC

## 2024-04-05 MED ORDER — OMEPRAZOLE 20 MG PO CPDR: 20.0000 mg | DELAYED_RELEASE_CAPSULE | Freq: Every day | ORAL | 1 refills | Status: DC

## 2024-04-05 MED ORDER — DEXAMETHASONE 4 MG PO TABS: 4.0000 mg | ORAL_TABLET | Freq: Every day | ORAL | 2 refills | Status: DC

## 2024-04-05 NOTE — Progress Notes (Signed)
 [facility] OFFICE PROGRESS NOTE   Diagnosis: Gastric cancer  INTERVAL HISTORY:   Henry May completed another cycle of oxaliplatin  zolbetuximab on 03/23/2024.  He reports cold sensitivity for approximately 2 days following chemotherapy.  No neuropathy symptoms at present.  No nausea/vomiting, mouth sores, or diarrhea.  He feels well.  Good appetite.  No abdominal pain.  Objective:  Vital signs in last 24 hours:  Blood pressure 112/78, pulse (!) 58, temperature 98.1 F (36.7 C), resp. rate 18, height 6' (1.829 m), weight 186 lb 3.2 oz (84.5 kg), SpO2 100%.    HEENT: No thrush or ulcers Resp: Lungs clear bilaterally Cardio: Regular rate and rhythm GI: No hepatosplenomegaly, no mass, nontender, no apparent ascites Vascular: No leg edema  Skin: Palms without erythema  Portacath/PICC-without erythema  Lab Results:  Lab Results  Component Value Date   WBC 10.7 (H) 04/05/2024   HGB 12.0 (L) 04/05/2024   HCT 36.7 (L) 04/05/2024   MCV 92.2 04/05/2024   PLT 178 04/05/2024   NEUTROABS 7.8 (H) 04/05/2024    CMP  Lab Results  Component Value Date   NA 140 04/05/2024   K 4.0 04/05/2024   CL 108 04/05/2024   CO2 26 04/05/2024   GLUCOSE 84 04/05/2024   BUN 16 04/05/2024   CREATININE 0.84 04/05/2024   CALCIUM  8.7 (L) 04/05/2024   PROT 6.0 (L) 04/05/2024   ALBUMIN  3.7 04/05/2024   AST 25 04/05/2024   ALT 33 04/05/2024   ALKPHOS 154 (H) 04/05/2024   BILITOT 0.3 04/05/2024   GFRNONAA >60 04/05/2024   GFRAA >60 09/03/2020    Lab Results  Component Value Date   CEA 3.57 01/15/2024    Medications: I have reviewed the patient's current medications.   Assessment/Plan: Gastric cancer 11/21/2023 EGD-gastric body with "infiltrative looking lesion"; biopsy shows involvement of a hypercellular lesion that suggests diffuse carcinoma by morphology CTs abdomen/pelvis 11/28/2023-diffuse fatty infiltration of the liver; gastric wall thickening; lymph nodes up to 6 mm  in diameter near the stomach wall. 12/31/2023 upper endoscopy-large diffuse friable infiltrative polypoid and ulcerated circumferential mass found in the gastric body.  Scope was passed beyond the mass with normal antrum/pylorus.  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  Negative for HER2 (1+); mismatch repair protein IHC normal; PD-L1 CPS 0%; CLDN18 positive: 90% of tumor cells with 2+/3+ membrane staining PET scan 01/07/2024-circumferential hypermetabolic gastric mucosal thickening.  Ill-defined hypermetabolic lymph nodes in the gastrohepatic ligament.  Intense hypermetabolic activity associated with the right lobe of the thyroid  gland.  Small hypermetabolic right axillary node favored reactive. 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 following cycle 1 Cycle 2 oxaliplatin  02/23/2024, Udenyca  Cycle 3 oxaliplatin , zolbetuximab 03/09/2024, Udenyca  Cycle 4 oxaliplatin , zolbetuximab 03/23/2024, Udenyca  Cycle 5 oxaliplatin , zolbetuximab 04/06/2024, Udenyca  Early satiety, postprandial abdominal pain, weight loss secondary to #1 History of bilateral lower extremity DVT 2021-anticoagulation discontinued due to massive retroperitoneal bleed, IVC filter placed 08/24/2020 Hospitalized with acute respiratory failure due to COVID-19 08/06/2020 - 09/04/2020 History of massive retroperitoneal bleed secondary to anticoagulation September 2021 Thyroid  ultrasound 01/13/2024-no abnormal nodule identified in the right lobe.  1.1 cm thyroid  isthmus nodule meets criteria for 1 year follow-up ultrasound. Admission 02/01/2024 with increased abdominal pain/ascites Hospitalization with altered mental status  02/05/2024 through 02/13/2024;  question rare case of 5-FU psychosis.  Improved 02/16/2024.  Mental status at baseline 02/23/2024. Neutropenia 02/16/2024 Mucositis 02/16/2024.  Resolved 02/23/2024 Right knee pain/edema/erythema 03/01/2024-Doppler study negative for DVT History of gout    Disposition: Henry May appears stable.  He is tolerating the oxaliplatin /zolbetuximab well.  He will complete another cycle on 04/06/2024.  He will return for an office visit prior to cycle 6.  He undergo restaging CTs after cycle 6.  Coni Deep, MD  04/05/2024  8:31 AM

## 2024-04-06 ENCOUNTER — Inpatient Hospital Stay

## 2024-04-06 ENCOUNTER — Encounter: Payer: Self-pay | Admitting: Oncology

## 2024-04-06 VITALS — BP 121/70 | HR 60 | Temp 98.0°F | Resp 16

## 2024-04-06 DIAGNOSIS — Z5111 Encounter for antineoplastic chemotherapy: Secondary | ICD-10-CM | POA: Diagnosis not present

## 2024-04-06 DIAGNOSIS — C169 Malignant neoplasm of stomach, unspecified: Secondary | ICD-10-CM

## 2024-04-06 MED ORDER — PALONOSETRON HCL INJECTION 0.25 MG/5ML
0.2500 mg | Freq: Once | INTRAVENOUS | Status: AC
Start: 2024-04-06 — End: 2024-04-06
  Administered 2024-04-06: 0.25 mg via INTRAVENOUS
  Filled 2024-04-06: qty 5

## 2024-04-06 MED ORDER — OXALIPLATIN CHEMO INJECTION 100 MG/20ML
83.0000 mg/m2 | Freq: Once | INTRAVENOUS | Status: AC
  Administered 2024-04-06: 170 mg via INTRAVENOUS
  Filled 2024-04-06: qty 34

## 2024-04-06 MED ORDER — SODIUM CHLORIDE 0.9 % IV SOLN
150.0000 mg | Freq: Once | INTRAVENOUS | Status: AC
  Administered 2024-04-06: 150 mg via INTRAVENOUS
  Filled 2024-04-06: qty 150

## 2024-04-06 MED ORDER — ZOLBETUXIMAB-CLZB CHEMO 100MG/5ML IV SOLN
387.0000 mg/m2 | Freq: Once | INTRAVENOUS | Status: AC
  Administered 2024-04-06: 800 mg via INTRAVENOUS
  Filled 2024-04-06: qty 40

## 2024-04-06 MED ORDER — SODIUM CHLORIDE 0.9% FLUSH
10.0000 mL | INTRAVENOUS | Status: DC | PRN
  Administered 2024-04-06: 10 mL

## 2024-04-06 MED ORDER — DEXAMETHASONE SODIUM PHOSPHATE 10 MG/ML IJ SOLN
10.0000 mg | Freq: Once | INTRAMUSCULAR | Status: AC
Start: 2024-04-06 — End: 2024-04-06
  Administered 2024-04-06: 10 mg via INTRAVENOUS
  Filled 2024-04-06: qty 1

## 2024-04-06 MED ORDER — DEXTROSE 5 % IV SOLN: INTRAVENOUS | Status: DC

## 2024-04-06 MED ORDER — SODIUM CHLORIDE 0.9 % IV SOLN
INTRAVENOUS | Status: DC
Start: 2024-04-06 — End: 2024-04-06

## 2024-04-06 MED ORDER — HEPARIN SOD (PORK) LOCK FLUSH 100 UNIT/ML IV SOLN
500.0000 [IU] | Freq: Once | INTRAVENOUS | Status: AC | PRN
  Administered 2024-04-06: 500 [IU]

## 2024-04-06 MED ORDER — OLANZAPINE 5 MG PO TABS
5.0000 mg | ORAL_TABLET | Freq: Once | ORAL | Status: AC
  Administered 2024-04-06: 5 mg via ORAL
  Filled 2024-04-06: qty 1

## 2024-04-06 NOTE — Addendum Note (Signed)
 Encounter addended by: Karolynn Pack on: 04/06/2024 9:11 AM  Actions taken: Imaging Exam ended

## 2024-04-06 NOTE — Progress Notes (Signed)
 [facility] CSW Progress Note  Patient's spouse emailed CSW on 03/29/24 requesting assistance with verifying phone calls / text from [facility] Medicaid. CSW unable to verify if attempts are from [facility], sent patient's spouse [place] Medicaid Number for additional guidance.    Maudie Sorrow, LCSW Clinical Social Worker [facility]

## 2024-04-06 NOTE — Progress Notes (Signed)
 [facility] CSW Progress Note  Visual merchandiser met with patient and spouse during infusion. Patient's spouse provided CSW letter from [facility] regarding medicaid and requested assistance with verifying information, inquired about available assistance for [employer], and assistance with total for billing.  CSW sent email to contact at DSS regarding medicaid letter, awaiting response.  CSW placed referral for the [facility] for mortgage assistance, awaiting responseCSW contacted [facility] department on patient's behalf and received updated total.   Maudie Sorrow, LCSW Clinical Social Worker [facility]

## 2024-04-07 ENCOUNTER — Ambulatory Visit

## 2024-04-07 ENCOUNTER — Other Ambulatory Visit: Payer: Self-pay

## 2024-04-08 ENCOUNTER — Inpatient Hospital Stay

## 2024-04-08 VITALS — BP 99/60 | HR 51 | Temp 98.3°F | Resp 17

## 2024-04-08 DIAGNOSIS — C169 Malignant neoplasm of stomach, unspecified: Secondary | ICD-10-CM

## 2024-04-08 DIAGNOSIS — Z5111 Encounter for antineoplastic chemotherapy: Secondary | ICD-10-CM | POA: Diagnosis not present

## 2024-04-08 MED ORDER — PEGFILGRASTIM-JMDB 6 MG/0.6ML ~~LOC~~ SOSY
6.0000 mg | PREFILLED_SYRINGE | Freq: Once | SUBCUTANEOUS | Status: AC
Start: 2024-04-08 — End: 2024-04-08
  Administered 2024-04-08: 6 mg via SUBCUTANEOUS
  Filled 2024-04-08: qty 0.6

## 2024-04-08 NOTE — Patient Instructions (Signed)

## 2024-04-13 NOTE — Progress Notes (Signed)
 [facility] CSW Progress Note  Clinical Child psychotherapist notified patient of [facility].     Maudie Sorrow, LCSW Clinical Social Worker [facility]

## 2024-04-15 ENCOUNTER — Encounter: Payer: Self-pay | Admitting: Oncology

## 2024-04-15 NOTE — Progress Notes (Signed)
 Received forwarded email from patient from [place] in social work during [facility] absence. Patient inquiring about an expense he submitted in April. I reviewed the request and reached out to [employer] regarding status. [place] in [place] states she had not received the request. I also noticed the mailing address was entered incorrectly. I reached out to [employer] and corrected and check will be mailed on Wednesday 04/21/24.  Attempted to reach patient for follow up and no answer. Left voicemail with my contact name and number.

## 2024-04-15 NOTE — Progress Notes (Signed)
 Patient's spouse returned call and I provided her with updated information on expense submitted. She was very Adult nurse.  She has my number for any additional financial questions or concerns.

## 2024-04-19 ENCOUNTER — Inpatient Hospital Stay: Attending: Nurse Practitioner

## 2024-04-19 ENCOUNTER — Inpatient Hospital Stay (HOSPITAL_BASED_OUTPATIENT_CLINIC_OR_DEPARTMENT_OTHER): Admitting: Oncology

## 2024-04-19 ENCOUNTER — Inpatient Hospital Stay

## 2024-04-19 VITALS — BP 115/74 | HR 60 | Temp 98.1°F | Resp 18 | Ht 72.0 in | Wt 186.7 lb

## 2024-04-19 DIAGNOSIS — Z5189 Encounter for other specified aftercare: Secondary | ICD-10-CM | POA: Diagnosis not present

## 2024-04-19 DIAGNOSIS — Z95828 Presence of other vascular implants and grafts: Secondary | ICD-10-CM

## 2024-04-19 DIAGNOSIS — C169 Malignant neoplasm of stomach, unspecified: Secondary | ICD-10-CM | POA: Diagnosis not present

## 2024-04-19 DIAGNOSIS — Z79899 Other long term (current) drug therapy: Secondary | ICD-10-CM | POA: Diagnosis not present

## 2024-04-19 DIAGNOSIS — C786 Secondary malignant neoplasm of retroperitoneum and peritoneum: Secondary | ICD-10-CM | POA: Diagnosis present

## 2024-04-19 DIAGNOSIS — C162 Malignant neoplasm of body of stomach: Secondary | ICD-10-CM | POA: Diagnosis present

## 2024-04-19 DIAGNOSIS — Z5111 Encounter for antineoplastic chemotherapy: Secondary | ICD-10-CM | POA: Diagnosis present

## 2024-04-19 DIAGNOSIS — D709 Neutropenia, unspecified: Secondary | ICD-10-CM | POA: Insufficient documentation

## 2024-04-19 DIAGNOSIS — Z86718 Personal history of other venous thrombosis and embolism: Secondary | ICD-10-CM | POA: Diagnosis not present

## 2024-04-19 LAB — CBC WITH DIFFERENTIAL (CANCER CENTER ONLY)
Abs Immature Granulocytes: 0.11 10*3/uL — ABNORMAL HIGH (ref 0.00–0.07)
Basophils Absolute: 0.1 10*3/uL (ref 0.0–0.1)
Basophils Relative: 0 %
Eosinophils Absolute: 0.5 10*3/uL (ref 0.0–0.5)
Eosinophils Relative: 3 %
HCT: 38.5 % — ABNORMAL LOW (ref 39.0–52.0)
Hemoglobin: 13 g/dL (ref 13.0–17.0)
Immature Granulocytes: 1 %
Lymphocytes Relative: 12 %
Lymphs Abs: 1.8 10*3/uL (ref 0.7–4.0)
MCH: 31.8 pg (ref 26.0–34.0)
MCHC: 33.8 g/dL (ref 30.0–36.0)
MCV: 94.1 fL (ref 80.0–100.0)
Monocytes Absolute: 0.9 10*3/uL (ref 0.1–1.0)
Monocytes Relative: 6 %
Neutro Abs: 11.3 10*3/uL — ABNORMAL HIGH (ref 1.7–7.7)
Neutrophils Relative %: 78 %
Platelet Count: 148 10*3/uL — ABNORMAL LOW (ref 150–400)
RBC: 4.09 MIL/uL — ABNORMAL LOW (ref 4.22–5.81)
RDW: 20.7 % — ABNORMAL HIGH (ref 11.5–15.5)
WBC Count: 14.5 10*3/uL — ABNORMAL HIGH (ref 4.0–10.5)
nRBC: 0 % (ref 0.0–0.2)

## 2024-04-19 LAB — CMP (CANCER CENTER ONLY)
ALT: 37 U/L (ref 0–44)
AST: 35 U/L (ref 15–41)
Albumin: 4.1 g/dL (ref 3.5–5.0)
Alkaline Phosphatase: 190 U/L — ABNORMAL HIGH (ref 38–126)
Anion gap: 9 (ref 5–15)
BUN: 16 mg/dL (ref 6–20)
CO2: 25 mmol/L (ref 22–32)
Calcium: 9.1 mg/dL (ref 8.9–10.3)
Chloride: 105 mmol/L (ref 98–111)
Creatinine: 0.85 mg/dL (ref 0.61–1.24)
GFR, Estimated: >60 mL/min (ref >60)
Glucose, Bld: 85 mg/dL (ref 70–99)
Potassium: 4.3 mmol/L (ref 3.5–5.1)
Sodium: 139 mmol/L (ref 135–145)
Total Bilirubin: <0.2 mg/dL (ref 0.0–1.2)
Total Protein: 6.5 g/dL (ref 6.5–8.1)

## 2024-04-19 MED ORDER — SODIUM CHLORIDE 0.9% FLUSH
10.0000 mL | Freq: Once | INTRAVENOUS | Status: AC
Start: 2024-04-19 — End: 2024-04-19
  Administered 2024-04-19: 10 mL via INTRAVENOUS

## 2024-04-19 MED ORDER — HEPARIN SOD (PORK) LOCK FLUSH 100 UNIT/ML IV SOLN
500.0000 [IU] | Freq: Once | INTRAVENOUS | Status: AC
  Administered 2024-04-19: 500 [IU] via INTRAVENOUS

## 2024-04-19 NOTE — Progress Notes (Signed)
 [facility] OFFICE PROGRESS NOTE   Diagnosis: Gastric cancer  INTERVAL HISTORY:   Henry May completed another cycle of oxaliplatin /zolbetuximab on 04/06/2024.  No nausea/vomiting, mouth sores, or diarrhea.  No confusion.  Good appetite.  He is eating at his home.  He reports cold sensitivity for approximately 1 week following chemotherapy.  Objective:  Vital signs in last 24 hours:  Blood pressure 115/74, pulse 60, temperature 98.1 F (36.7 C), resp. rate 18, height 6' (1.829 m), weight 186 lb 11.2 oz (84.7 kg), SpO2 100%.    HEENT: No thrush or ulcers Resp: Lungs clear bilaterally Cardio: Regular rate and rhythm GI: No hepatosplenomegaly, nontender, no apparent ascites, no mass Vascular: No leg edema Neuro: Mild loss of vibratory sense at the fingertips bilaterally   Portacath/PICC-without erythema  Lab Results:  Lab Results  Component Value Date   WBC 14.5 (H) 04/19/2024   HGB 13.0 04/19/2024   HCT 38.5 (L) 04/19/2024   MCV 94.1 04/19/2024   PLT 148 (L) 04/19/2024   NEUTROABS 11.3 (H) 04/19/2024    CMP  Lab Results  Component Value Date   NA 140 04/05/2024   K 4.0 04/05/2024   CL 108 04/05/2024   CO2 26 04/05/2024   GLUCOSE 84 04/05/2024   BUN 16 04/05/2024   CREATININE 0.84 04/05/2024   CALCIUM  8.7 (L) 04/05/2024   PROT 6.0 (L) 04/05/2024   ALBUMIN  3.7 04/05/2024   AST 25 04/05/2024   ALT 33 04/05/2024   ALKPHOS 154 (H) 04/05/2024   BILITOT 0.3 04/05/2024   GFRNONAA >60 04/05/2024   GFRAA >60 09/03/2020    Lab Results  Component Value Date   CEA 3.57 01/15/2024    Medications: I have reviewed the patient's current medications.   Assessment/Plan: Gastric cancer 11/21/2023 EGD-gastric body with "infiltrative looking lesion"; biopsy shows involvement of a hypercellular lesion that suggests diffuse carcinoma by morphology CTs abdomen/pelvis 11/28/2023-diffuse fatty infiltration of the liver; gastric wall thickening; lymph nodes up  to 6 mm in diameter near the stomach wall. 12/31/2023 upper endoscopy-large diffuse friable infiltrative polypoid and ulcerated circumferential mass found in the gastric body.  Scope was passed beyond the mass with normal antrum/pylorus.  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  Negative for HER2 (1+); mismatch repair protein IHC normal; PD-L1 CPS 0%; CLDN18 positive: 90% of tumor cells with 2+/3+ membrane staining PET scan 01/07/2024-circumferential hypermetabolic gastric mucosal thickening.  Ill-defined hypermetabolic lymph nodes in the gastrohepatic ligament.  Intense hypermetabolic activity associated with the right lobe of the thyroid  gland.  Small hypermetabolic right axillary node favored reactive. 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 following cycle 1 Cycle 2 oxaliplatin  02/23/2024, Udenyca  Cycle 3 oxaliplatin , zolbetuximab 03/09/2024, Udenyca  Cycle 4 oxaliplatin , zolbetuximab 03/23/2024, Udenyca  Cycle 5 oxaliplatin , zolbetuximab 04/06/2024, Fulphila  Cycle 6 oxaliplatin , zolbetuximab 04/20/2024, Fulphila  Early satiety, postprandial abdominal pain, weight loss secondary to #1 History of bilateral lower extremity DVT 2021-anticoagulation discontinued due to massive retroperitoneal bleed, IVC filter placed 08/24/2020 Hospitalized with acute respiratory failure due to COVID-19 08/06/2020 - 09/04/2020 History of massive retroperitoneal bleed secondary to anticoagulation September 2021 Thyroid  ultrasound 01/13/2024-no abnormal nodule identified in the right lobe.  1.1 cm thyroid  isthmus nodule meets criteria for 1 year follow-up ultrasound. Admission 02/01/2024 with increased  abdominal pain/ascites Hospitalization with altered  mental status 02/05/2024 through 02/13/2024; question rare case of 5-FU psychosis.  Improved 02/16/2024.  Mental status at baseline 02/23/2024. Neutropenia 02/16/2024 Mucositis 02/16/2024.  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  He has completed 5 cycles of chemotherapy.  He will complete cycle 6 tomorrow.  He will undergo restaging CT evaluation after cycle 6.  He will return for an office visit in 2 weeks.  Coni Deep, MD  04/19/2024  11:25 AM

## 2024-04-20 ENCOUNTER — Inpatient Hospital Stay

## 2024-04-20 VITALS — BP 106/68 | HR 53 | Temp 97.9°F | Resp 14

## 2024-04-20 DIAGNOSIS — Z5111 Encounter for antineoplastic chemotherapy: Secondary | ICD-10-CM | POA: Diagnosis not present

## 2024-04-20 DIAGNOSIS — C169 Malignant neoplasm of stomach, unspecified: Secondary | ICD-10-CM

## 2024-04-20 MED ORDER — OLANZAPINE 5 MG PO TABS
5.0000 mg | ORAL_TABLET | Freq: Once | ORAL | Status: AC
  Administered 2024-04-20: 5 mg via ORAL
  Filled 2024-04-20: qty 1

## 2024-04-20 MED ORDER — PALONOSETRON HCL INJECTION 0.25 MG/5ML
0.2500 mg | Freq: Once | INTRAVENOUS | Status: AC
Start: 2024-04-20 — End: 2024-04-20
  Administered 2024-04-20: 0.25 mg via INTRAVENOUS
  Filled 2024-04-20: qty 5

## 2024-04-20 MED ORDER — ZOLBETUXIMAB-CLZB CHEMO 100MG/5ML IV SOLN
387.0000 mg/m2 | Freq: Once | INTRAVENOUS | Status: AC
  Administered 2024-04-20: 800 mg via INTRAVENOUS
  Filled 2024-04-20: qty 40

## 2024-04-20 MED ORDER — HEPARIN SOD (PORK) LOCK FLUSH 100 UNIT/ML IV SOLN
500.0000 [IU] | Freq: Once | INTRAVENOUS | Status: AC | PRN
  Administered 2024-04-20: 500 [IU]

## 2024-04-20 MED ORDER — DEXTROSE 5 % IV SOLN: INTRAVENOUS | Status: DC

## 2024-04-20 MED ORDER — OXALIPLATIN CHEMO INJECTION 100 MG/20ML
85.0000 mg/m2 | Freq: Once | INTRAVENOUS | Status: AC
  Administered 2024-04-20: 175 mg via INTRAVENOUS
  Filled 2024-04-20: qty 35

## 2024-04-20 MED ORDER — DEXAMETHASONE SODIUM PHOSPHATE 10 MG/ML IJ SOLN
10.0000 mg | Freq: Once | INTRAMUSCULAR | Status: AC
  Administered 2024-04-20: 10 mg via INTRAVENOUS
  Filled 2024-04-20: qty 1

## 2024-04-20 MED ORDER — SODIUM CHLORIDE 0.9 % IV SOLN
INTRAVENOUS | Status: DC
Start: 2024-04-20 — End: 2024-04-20

## 2024-04-20 MED ORDER — SODIUM CHLORIDE 0.9% FLUSH
10.0000 mL | INTRAVENOUS | Status: DC | PRN
Start: 2024-04-20 — End: 2024-04-20
  Administered 2024-04-20: 10 mL

## 2024-04-20 MED ORDER — SODIUM CHLORIDE 0.9 % IV SOLN
150.0000 mg | Freq: Once | INTRAVENOUS | Status: AC
  Administered 2024-04-20: 150 mg via INTRAVENOUS
  Filled 2024-04-20: qty 150

## 2024-04-21 ENCOUNTER — Other Ambulatory Visit: Payer: Self-pay | Admitting: *Deleted

## 2024-04-21 ENCOUNTER — Other Ambulatory Visit: Payer: Self-pay

## 2024-04-21 MED ORDER — SCOPOLAMINE 1 MG/3DAYS TD PT72: 1.0000 | MEDICATED_PATCH | TRANSDERMAL | 2 refills | Status: DC

## 2024-04-21 NOTE — Telephone Encounter (Signed)
 While here for treatment yesterday, patient requests refill on scopolamine  patch.

## 2024-04-22 ENCOUNTER — Encounter: Payer: Self-pay | Admitting: Oncology

## 2024-04-22 ENCOUNTER — Inpatient Hospital Stay

## 2024-04-22 VITALS — BP 116/61 | HR 53 | Temp 97.8°F | Resp 16

## 2024-04-22 DIAGNOSIS — Z5111 Encounter for antineoplastic chemotherapy: Secondary | ICD-10-CM | POA: Diagnosis not present

## 2024-04-22 DIAGNOSIS — C169 Malignant neoplasm of stomach, unspecified: Secondary | ICD-10-CM

## 2024-04-22 MED ORDER — PEGFILGRASTIM-JMDB 6 MG/0.6ML ~~LOC~~ SOSY
6.0000 mg | PREFILLED_SYRINGE | Freq: Once | SUBCUTANEOUS | Status: AC
Start: 2024-04-22 — End: 2024-04-22
  Administered 2024-04-22: 6 mg via SUBCUTANEOUS

## 2024-04-24 ENCOUNTER — Other Ambulatory Visit: Payer: Self-pay

## 2024-04-30 ENCOUNTER — Ambulatory Visit (HOSPITAL_BASED_OUTPATIENT_CLINIC_OR_DEPARTMENT_OTHER)
Admission: RE | Admit: 2024-04-30 | Discharge: 2024-04-30 | Disposition: A | Discharge status: Home / Self Care | Source: Ambulatory Visit | Attending: Oncology | Admitting: Oncology

## 2024-04-30 DIAGNOSIS — C169 Malignant neoplasm of stomach, unspecified: Secondary | ICD-10-CM | POA: Insufficient documentation

## 2024-04-30 MED ORDER — IOHEXOL 300 MG/ML  SOLN
100.0000 mL | Freq: Once | INTRAMUSCULAR | Status: AC | PRN
  Administered 2024-04-30: 100 mL via INTRAVENOUS

## 2024-05-04 ENCOUNTER — Encounter: Payer: Self-pay | Admitting: Nurse Practitioner

## 2024-05-04 ENCOUNTER — Inpatient Hospital Stay

## 2024-05-04 ENCOUNTER — Inpatient Hospital Stay (HOSPITAL_BASED_OUTPATIENT_CLINIC_OR_DEPARTMENT_OTHER): Admitting: Nurse Practitioner

## 2024-05-04 VITALS — BP 114/75 | HR 62 | Temp 98.1°F | Resp 18 | Ht 72.0 in | Wt 186.8 lb

## 2024-05-04 DIAGNOSIS — C169 Malignant neoplasm of stomach, unspecified: Secondary | ICD-10-CM

## 2024-05-04 DIAGNOSIS — Z5111 Encounter for antineoplastic chemotherapy: Secondary | ICD-10-CM | POA: Diagnosis not present

## 2024-05-04 LAB — CBC WITH DIFFERENTIAL (CANCER CENTER ONLY)
Abs Immature Granulocytes: 0.05 10*3/uL (ref 0.00–0.07)
Basophils Absolute: 0 10*3/uL (ref 0.0–0.1)
Basophils Relative: 0 %
Eosinophils Absolute: 0.3 10*3/uL (ref 0.0–0.5)
Eosinophils Relative: 3 %
HCT: 40.2 % (ref 39.0–52.0)
Hemoglobin: 13.2 g/dL (ref 13.0–17.0)
Immature Granulocytes: 1 %
Lymphocytes Relative: 16 %
Lymphs Abs: 1.7 10*3/uL (ref 0.7–4.0)
MCH: 31.7 pg (ref 26.0–34.0)
MCHC: 32.8 g/dL (ref 30.0–36.0)
MCV: 96.6 fL (ref 80.0–100.0)
Monocytes Absolute: 0.7 10*3/uL (ref 0.1–1.0)
Monocytes Relative: 6 %
Neutro Abs: 8.1 10*3/uL — ABNORMAL HIGH (ref 1.7–7.7)
Neutrophils Relative %: 74 %
Platelet Count: 143 10*3/uL — ABNORMAL LOW (ref 150–400)
RBC: 4.16 MIL/uL — ABNORMAL LOW (ref 4.22–5.81)
RDW: 19.9 % — ABNORMAL HIGH (ref 11.5–15.5)
WBC Count: 10.9 10*3/uL — ABNORMAL HIGH (ref 4.0–10.5)
nRBC: 0 % (ref 0.0–0.2)

## 2024-05-04 LAB — CMP (CANCER CENTER ONLY)
ALT: 32 U/L (ref 0–44)
AST: 35 U/L (ref 15–41)
Albumin: 4.1 g/dL (ref 3.5–5.0)
Alkaline Phosphatase: 166 U/L — ABNORMAL HIGH (ref 38–126)
Anion gap: 10 (ref 5–15)
BUN: 16 mg/dL (ref 6–20)
CO2: 27 mmol/L (ref 22–32)
Calcium: 9.7 mg/dL (ref 8.9–10.3)
Chloride: 102 mmol/L (ref 98–111)
Creatinine: 0.86 mg/dL (ref 0.61–1.24)
GFR, Estimated: >60 mL/min (ref >60)
Glucose, Bld: 87 mg/dL (ref 70–99)
Potassium: 4.4 mmol/L (ref 3.5–5.1)
Sodium: 139 mmol/L (ref 135–145)
Total Bilirubin: 0.2 mg/dL (ref 0.0–1.2)
Total Protein: 7.1 g/dL (ref 6.5–8.1)

## 2024-05-04 MED ORDER — SODIUM CHLORIDE 0.9% FLUSH
10.0000 mL | Freq: Once | INTRAVENOUS | Status: AC
  Administered 2024-05-04: 10 mL via INTRAVENOUS

## 2024-05-04 MED ORDER — HEPARIN SOD (PORK) LOCK FLUSH 100 UNIT/ML IV SOLN
500.0000 [IU] | Freq: Once | INTRAVENOUS | Status: AC
  Administered 2024-05-04: 500 [IU] via INTRAVENOUS

## 2024-05-04 NOTE — Progress Notes (Signed)
 [facility] CSW Progress Note  Visual merchandiser met with patient's spouse during provider appointment. Patient's spouse discussed reading of the patient's scans. Primary need was updates on cone based assistance patient previously applied for. CSW sent message to Patient Financial Specialist who is assisting with payment to reach out to patient and family. Patient Acknowledgement Form signed and submitted [employer]). CSW continues to be available to support patient and family.    Maudie Sorrow, LCSW Clinical Social Worker [facility]

## 2024-05-04 NOTE — Progress Notes (Signed)
 [facility] OFFICE PROGRESS NOTE   Diagnosis: Gastric cancer  INTERVAL HISTORY:   Henry May returns as scheduled.  He completed another cycle of oxaliplatin /zolbetuximab 04/19/2024.  He denies nausea/vomiting.  No mouth sores.  No diarrhea.  No signs of allergic reaction.  Cold sensitivity lasted about 1 week.  No persistent neuropathy symptoms.  He has a good appetite.  He eats small frequent meals.  Objective:  Vital signs in last 24 hours:  Blood pressure 114/75, pulse 62, temperature 98.1 F (36.7 C), temperature source Temporal, resp. rate 18, height 6' (1.829 m), weight 186 lb 12.8 oz (84.7 kg), SpO2 100%.    HEENT: No thrush or ulcers. Resp: Lungs clear bilaterally. Cardio: Regular rate and rhythm. GI: No hepatosplenomegaly.  No mass.  No apparent ascites. Vascular: No leg edema. Neuro: Very minimal decrease in vibratory sense over the fingertips per tuning fork exam. Skin: No erythema. Port-A-Cath without erythema.  Lab Results:  Lab Results  Component Value Date   WBC 10.9 (H) 05/04/2024   HGB 13.2 05/04/2024   HCT 40.2 05/04/2024   MCV 96.6 05/04/2024   PLT 143 (L) 05/04/2024   NEUTROABS 8.1 (H) 05/04/2024    Imaging:  No results found.  Medications: I have reviewed the patient's current medications.  Assessment/Plan: Gastric cancer 11/21/2023 EGD-gastric body with "infiltrative looking lesion"; biopsy shows involvement of a hypercellular lesion that suggests diffuse carcinoma by morphology CTs abdomen/pelvis 11/28/2023-diffuse fatty infiltration of the liver; gastric wall thickening; lymph nodes up to 6 mm in diameter near the stomach wall. 12/31/2023 upper endoscopy-large diffuse friable infiltrative polypoid and ulcerated circumferential mass found in the gastric body.  Scope was passed beyond the mass with normal antrum/pylorus.  The mass came within 2 cm of the GE junction; biopsy shows poorly differentiated adenocarcinoma with signet ring  cells.  Negative for HER2 (1+); mismatch repair protein IHC normal; PD-L1 CPS 0%; CLDN18 positive: 90% of tumor cells with 2+/3+ membrane staining PET scan 01/07/2024-circumferential hypermetabolic gastric mucosal thickening.  Ill-defined hypermetabolic lymph nodes in the gastrohepatic ligament.  Intense hypermetabolic activity associated with the right lobe of the thyroid  gland.  Small hypermetabolic right axillary node favored reactive. 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  Gastric wall thickening slightly improved. Cycle 7 oxaliplatin , zolbetuximab 05/05/2024, Fulphila  Early satiety, postprandial abdominal pain, weight loss secondary to #1 History of bilateral lower extremity DVT 2021-anticoagulation discontinued due to massive retroperitoneal bleed, IVC filter placed 08/24/2020 Hospitalized with acute respiratory failure due to COVID-19 08/06/2020 - 09/04/2020 History of massive retroperitoneal bleed secondary to anticoagulation September 2021 Thyroid  ultrasound 01/13/2024-no abnormal nodule identified in the right lobe.  1.1 cm thyroid  isthmus nodule meets criteria for 1 year follow-up ultrasound. Admission 02/01/2024 with increased abdominal pain/ascites Hospitalization with altered mental status 02/05/2024 through 02/13/2024; question rare case of 5-FU psychosis.  Improved 02/16/2024.  Mental status at baseline  02/23/2024. Neutropenia 02/16/2024 Mucositis 02/16/2024.  Resolved 02/23/2024 Right knee pain/edema/erythema 03/01/2024-Doppler study negative for DVT History of gout Oxaliplatin  neuropathy-prolonged cold sensitivity, diminished vibratory sense following cycle 5 chemotherapy      Disposition: Henry May appears stable.  He has completed 6 cycles of systemic therapy.  Current treatment is Oxaliplatin /zolbetuximab every 2 weeks.  He is tolerating treatment well.  There is no clinical evidence of disease progression.  Recent restaging CTs show improvement in peritoneal carcinomatosis and gastric wall thickening.  Plan to continue oxaliplatin /zolbetuximab, cycle 7 05/05/2024.  CBC and chemistry panel reviewed.  Labs adequate to proceed as above.  He will return for follow-up and treatment in 2 weeks.  Patient seen with Dr. Scherrie Curt.  CT images reviewed with Mr. Turvey and his daughter at today's visit.    Diana Forster ANP/GNP-BC   05/04/2024  10:43 AM    Mr. Defalco appears well.  We reviewed the restaging CT findings and images with him.  He has experienced marked clinical and radiologic improvement with systemic therapy.  I recommend continuing oxaliplatin /zolbetuximab.  Oxaliplatin  will be discontinued from the chemotherapy regimen when he develops increased neuropathy symptoms.  I was present for greater than 50% of today's visit.  I performed medical decision making.  Anise Kerns, MD

## 2024-05-05 ENCOUNTER — Inpatient Hospital Stay

## 2024-05-05 VITALS — BP 111/66 | HR 54 | Temp 98.1°F | Resp 18 | Ht 72.0 in | Wt 186.7 lb

## 2024-05-05 DIAGNOSIS — C169 Malignant neoplasm of stomach, unspecified: Secondary | ICD-10-CM

## 2024-05-05 DIAGNOSIS — Z5111 Encounter for antineoplastic chemotherapy: Secondary | ICD-10-CM | POA: Diagnosis not present

## 2024-05-05 MED ORDER — DEXAMETHASONE SODIUM PHOSPHATE 10 MG/ML IJ SOLN
10.0000 mg | Freq: Once | INTRAMUSCULAR | Status: AC
  Administered 2024-05-05: 10 mg via INTRAVENOUS
  Filled 2024-05-05: qty 1

## 2024-05-05 MED ORDER — SODIUM CHLORIDE 0.9 % IV SOLN: INTRAVENOUS | Status: DC

## 2024-05-05 MED ORDER — HEPARIN SOD (PORK) LOCK FLUSH 100 UNIT/ML IV SOLN
500.0000 [IU] | Freq: Once | INTRAVENOUS | Status: AC | PRN
  Administered 2024-05-05: 500 [IU]

## 2024-05-05 MED ORDER — DEXTROSE 5 % IV SOLN: INTRAVENOUS | Status: DC

## 2024-05-05 MED ORDER — SODIUM CHLORIDE 0.9% FLUSH
10.0000 mL | INTRAVENOUS | Status: DC | PRN
  Administered 2024-05-05: 10 mL

## 2024-05-05 MED ORDER — SODIUM CHLORIDE 0.9 % IV SOLN
150.0000 mg | Freq: Once | INTRAVENOUS | Status: AC
  Administered 2024-05-05: 150 mg via INTRAVENOUS
  Filled 2024-05-05: qty 150

## 2024-05-05 MED ORDER — ZOLBETUXIMAB-CLZB CHEMO 100MG/5ML IV SOLN
800.0000 mg | Freq: Once | INTRAVENOUS | Status: AC
  Administered 2024-05-05: 800 mg via INTRAVENOUS
  Filled 2024-05-05: qty 40

## 2024-05-05 MED ORDER — PALONOSETRON HCL INJECTION 0.25 MG/5ML
0.2500 mg | Freq: Once | INTRAVENOUS | Status: AC
Start: 2024-05-05 — End: 2024-05-05
  Administered 2024-05-05: 0.25 mg via INTRAVENOUS
  Filled 2024-05-05: qty 5

## 2024-05-05 MED ORDER — OLANZAPINE 5 MG PO TABS
5.0000 mg | ORAL_TABLET | Freq: Once | ORAL | Status: AC
  Administered 2024-05-05: 5 mg via ORAL
  Filled 2024-05-05: qty 1

## 2024-05-05 MED ORDER — OXALIPLATIN CHEMO INJECTION 100 MG/20ML
85.0000 mg/m2 | Freq: Once | INTRAVENOUS | Status: AC
  Administered 2024-05-05: 175 mg via INTRAVENOUS
  Filled 2024-05-05: qty 35

## 2024-05-05 NOTE — Patient Instructions (Signed)
 [facility] - A DEPT OF [facility]   Discharge Instructions: Thank you for choosing [facility] to provide your oncology and hematology care.   If you have a lab appointment with the Cancer Center, please go directly to the Cancer Center and check in at the registration area.   Wear comfortable clothing and clothing appropriate for easy access to any Portacath or PICC line.   We strive to give you quality time with your provider. You may need to reschedule your appointment if you arrive late (15 or more minutes).  Arriving late affects you and other patients whose appointments are after yours.  Also, if you miss three or more appointments without notifying the office, you may be dismissed from the clinic at the provider's discretion.      For prescription refill requests, have your pharmacy contact our office and allow 72 hours for refills to be completed.    Today you received the following chemotherapy and/or immunotherapy agents VYLOY/ OXALIPLATIN       To help prevent nausea and vomiting after your treatment, we encourage you to take your nausea medication as directed.  BELOW ARE SYMPTOMS THAT SHOULD BE REPORTED IMMEDIATELY: *FEVER GREATER THAN 100.4 F (38 C) OR HIGHER *CHILLS OR SWEATING *NAUSEA AND VOMITING THAT IS NOT CONTROLLED WITH YOUR NAUSEA MEDICATION *UNUSUAL SHORTNESS OF BREATH *UNUSUAL BRUISING OR BLEEDING *URINARY PROBLEMS (pain or burning when urinating, or frequent urination) *BOWEL PROBLEMS (unusual diarrhea, constipation, pain near the anus) TENDERNESS IN MOUTH AND THROAT WITH OR WITHOUT PRESENCE OF ULCERS (sore throat, sores in mouth, or a toothache) UNUSUAL RASH, SWELLING OR PAIN  UNUSUAL VAGINAL DISCHARGE OR ITCHING   Items with * indicate a potential emergency and should be followed up as soon as possible or go to the Emergency Department if any problems should occur.  Please show the CHEMOTHERAPY ALERT CARD or  IMMUNOTHERAPY ALERT CARD at check-in to the Emergency Department and triage nurse.  Should you have questions after your visit or need to cancel or reschedule your appointment, please contact [facility] - A DEPT OF [facility]  Dept: [phone]  and follow the prompts.  Office hours are 8:00 a.m. to 4:30 p.m. Monday - Friday. Please note that voicemails left after 4:00 p.m. may not be returned until the following business day.  We are closed weekends and major holidays. You have access to a nurse at all times for urgent questions. Please call the main number to the clinic Dept: [phone] and follow the prompts.   For any non-urgent questions, you may also contact your provider using MyChart. We now offer e-Visits for anyone 70 and older to request care online for non-urgent symptoms. For details visit [email or web address].   Also download the MyChart app! Go to the app store, search "MyChart", open the app, select [place], and log in with your MyChart username and password.  Oxaliplatin  Injection What is this medication? OXALIPLATIN  (ox AL i PLA tin) treats colorectal cancer. It works by slowing down the growth of cancer cells. This medicine may be used for other purposes; ask your health care provider or pharmacist if you have questions. COMMON BRAND NAME(S): Eloxatin  What should I tell my care team before I take this medication? They need to know if you have any of these conditions: Heart disease History of irregular heartbeat or rhythm Liver disease Low blood cell levels (white cells, red cells, and platelets) Lung or breathing  disease, such as asthma Take medications that treat or prevent blood clots Tingling of the fingers, toes, or other nerve disorder An unusual or allergic reaction to oxaliplatin , other medications, foods, dyes, or preservatives If you or your partner are pregnant or trying to get pregnant Breast-feeding How should I use  this medication? This medication is injected into a vein. It is given by your care team in a hospital or clinic setting. Talk to your care team about the use of this medication in children. Special care may be needed. Overdosage: If you think you have taken too much of this medicine contact a poison control center or emergency room at once. NOTE: This medicine is only for you. Do not share this medicine with others. What if I miss a dose? Keep appointments for follow-up doses. It is important not to miss a dose. Call your care team if you are unable to keep an appointment. What may interact with this medication? Do not take this medication with any of the following: Cisapride Dronedarone Pimozide Thioridazine This medication may also interact with the following: Aspirin and aspirin-like medications Certain medications that treat or prevent blood clots, such as warfarin, apixaban, dabigatran, and rivaroxaban Cisplatin Cyclosporine Diuretics Medications for infection, such as acyclovir, adefovir, amphotericin B, bacitracin, cidofovir, foscarnet, ganciclovir, gentamicin, pentamidine, vancomycin  NSAIDs, medications for pain and inflammation, such as ibuprofen or naproxen Other medications that cause heart rhythm changes Pamidronate Zoledronic acid This list may not describe all possible interactions. Give your health care provider a list of all the medicines, herbs, non-prescription drugs, or dietary supplements you use. Also tell them if you smoke, drink alcohol, or use illegal drugs. Some items may interact with your medicine. What should I watch for while using this medication? Your condition will be monitored carefully while you are receiving this medication. You may need blood work while taking this medication. This medication may make you feel generally unwell. This is not uncommon as chemotherapy can affect healthy cells as well as cancer cells. Report any side effects. Continue your  course of treatment even though you feel ill unless your care team tells you to stop. This medication may increase your risk of getting an infection. Call your care team for advice if you get a fever, chills, sore throat, or other symptoms of a cold or flu. Do not treat yourself. Try to avoid being around people who are sick. Avoid taking medications that contain aspirin, acetaminophen , ibuprofen, naproxen, or ketoprofen unless instructed by your care team. These medications may hide a fever. Be careful brushing or flossing your teeth or using a toothpick because you may get an infection or bleed more easily. If you have any dental work done, tell your dentist you are receiving this medication. This medication can make you more sensitive to cold. Do not drink cold drinks or use ice. Cover exposed skin before coming in contact with cold temperatures or cold objects. When out in cold weather wear warm clothing and cover your mouth and nose to warm the air that goes into your lungs. Tell your care team if you get sensitive to the cold. Talk to your care team if you or your partner are pregnant or think either of you might be pregnant. This medication can cause serious birth defects if taken during pregnancy and for 9 months after the last dose. A negative pregnancy test is required before starting this medication. A reliable form of contraception is recommended while taking this medication and for 9 months  after the last dose. Talk to your care team about effective forms of contraception. Do not father a child while taking this medication and for 6 months after the last dose. Use a condom while having sex during this time period. Do not breastfeed while taking this medication and for 3 months after the last dose. This medication may cause infertility. Talk to your care team if you are concerned about your fertility. What side effects may I notice from receiving this medication? Side effects that you should  report to your care team as soon as possible: Allergic reactions--skin rash, itching, hives, swelling of the face, lips, tongue, or throat Bleeding--bloody or black, tar-like stools, vomiting blood or brown material that looks like coffee grounds, red or dark brown urine, small red or purple spots on skin, unusual bruising or bleeding Dry cough, shortness of breath or trouble breathing Heart rhythm changes--fast or irregular heartbeat, dizziness, feeling faint or lightheaded, chest pain, trouble breathing Infection--fever, chills, cough, sore throat, wounds that don't heal, pain or trouble when passing urine, general feeling of discomfort or being unwell Liver injury--right upper belly pain, loss of appetite, nausea, light-colored stool, dark yellow or brown urine, yellowing skin or eyes, unusual weakness or fatigue Low red blood cell level--unusual weakness or fatigue, dizziness, headache, trouble breathing Muscle injury--unusual weakness or fatigue, muscle pain, dark yellow or brown urine, decrease in amount of urine Pain, tingling, or numbness in the hands or feet Sudden and severe headache, confusion, change in vision, seizures, which may be signs of posterior reversible encephalopathy syndrome (PRES) Unusual bruising or bleeding Side effects that usually do not require medical attention (report to your care team if they continue or are bothersome): Diarrhea Nausea Pain, redness, or swelling with sores inside the mouth or throat Unusual weakness or fatigue Vomiting This list may not describe all possible side effects. Call your doctor for medical advice about side effects. You may report side effects to FDA at 1-800-FDA-1088. Where should I keep my medication? This medication is given in a hospital or clinic. It will not be stored at home. NOTE: This sheet is a summary. It may not cover all possible information. If you have questions about this medicine, talk to your doctor, pharmacist, or  health care provider.  2024 Elsevier/Gold Standard (2023-11-14 00:00:00)

## 2024-05-06 ENCOUNTER — Other Ambulatory Visit: Payer: Self-pay

## 2024-05-07 ENCOUNTER — Inpatient Hospital Stay

## 2024-05-07 VITALS — BP 113/72 | HR 57 | Temp 98.5°F | Resp 18

## 2024-05-07 DIAGNOSIS — Z5111 Encounter for antineoplastic chemotherapy: Secondary | ICD-10-CM | POA: Diagnosis not present

## 2024-05-07 DIAGNOSIS — C169 Malignant neoplasm of stomach, unspecified: Secondary | ICD-10-CM

## 2024-05-07 MED ORDER — PEGFILGRASTIM-JMDB 6 MG/0.6ML ~~LOC~~ SOSY
6.0000 mg | PREFILLED_SYRINGE | Freq: Once | SUBCUTANEOUS | Status: AC
  Administered 2024-05-07: 6 mg via SUBCUTANEOUS

## 2024-05-07 NOTE — Patient Instructions (Signed)
 [facility] - A DEPT OF [facility]  Discharge Instructions: Thank you for choosing [facility] to provide your oncology and hematology care.   If you have a lab appointment with the Cancer Center, please go directly to the Cancer Center and check in at the registration area.   Wear comfortable clothing and clothing appropriate for easy access to any Portacath or PICC line.   We strive to give you quality time with your provider. You may need to reschedule your appointment if you arrive late (15 or more minutes).  Arriving late affects you and other patients whose appointments are after yours.  Also, if you miss three or more appointments without notifying the office, you may be dismissed from the clinic at the provider's discretion.      For prescription refill requests, have your pharmacy contact our office and allow 72 hours for refills to be completed.    Today you received the following Fulphila  injection.  Pegfilgrastim  Injection What is this medication? PEGFILGRASTIM  (PEG fil gra stim) lowers the risk of infection in people who are receiving chemotherapy. It works by Systems analyst make more white blood cells, which protects your body from infection. It may also be used to help people who have been exposed to high doses of radiation. This medicine may be used for other purposes; ask your health care provider or pharmacist if you have questions. COMMON BRAND NAME(S): Fulphila , Fylnetra , Neulasta , Nyvepria , Stimufend , UDENYCA , UDENYCA  ONBODY, Ziextenzo  What should I tell my care team before I take this medication? They need to know if you have any of these conditions: Kidney disease Latex allergy Ongoing radiation therapy Sickle cell disease Skin reactions to acrylic adhesives (On-Body Injector only) An unusual or allergic reaction to pegfilgrastim , filgrastim, other medications, foods, dyes, or preservatives Pregnant or trying to get  pregnant Breast-feeding How should I use this medication? This medication is for injection under the skin. If you get this medication at home, you will be taught how to prepare and give the pre-filled syringe or how to use the On-body Injector. Refer to the patient Instructions for Use for detailed instructions. Use exactly as directed. Tell your care team immediately if you suspect that the On-body Injector may not have performed as intended or if you suspect the use of the On-body Injector resulted in a missed or partial dose. It is important that you put your used needles and syringes in a special sharps container. Do not put them in a trash can. If you do not have a sharps container, call your pharmacist or care team to get one. Talk to your care team about the use of this medication in children. While this medication may be prescribed for selected conditions, precautions do apply. Overdosage: If you think you have taken too much of this medicine contact a poison control center or emergency room at once. NOTE: This medicine is only for you. Do not share this medicine with others. What if I miss a dose? It is important not to miss your dose. Call your care team if you miss your dose. If you miss a dose due to an On-body Injector failure or leakage, a new dose should be administered as soon as possible using a single prefilled syringe for manual use. What may interact with this medication? Interactions have not been studied. This list may not describe all possible interactions. Give your health care provider a list of all the medicines, herbs, non-prescription  drugs, or dietary supplements you use. Also tell them if you smoke, drink alcohol, or use illegal drugs. Some items may interact with your medicine. What should I watch for while using this medication? Your condition will be monitored carefully while you are receiving this medication. You may need blood work done while you are taking this  medication. Talk to your care team about your risk of cancer. You may be more at risk for certain types of cancer if you take this medication. If you are going to need a MRI, CT scan, or other procedure, tell your care team that you are using this medication (On-Body Injector only). What side effects may I notice from receiving this medication? Side effects that you should report to your care team as soon as possible: Allergic reactions--skin rash, itching, hives, swelling of the face, lips, tongue, or throat Capillary leak syndrome--stomach or muscle pain, unusual weakness or fatigue, feeling faint or lightheaded, decrease in the amount of urine, swelling of the ankles, hands, or feet, trouble breathing High white blood cell level--fever, fatigue, trouble breathing, night sweats, change in vision, weight loss Inflammation of the aorta--fever, fatigue, back, chest, or stomach pain, severe headache Kidney injury (glomerulonephritis)--decrease in the amount of urine, red or dark brown urine, foamy or bubbly urine, swelling of the ankles, hands, or feet Shortness of breath or trouble breathing Spleen injury--pain in upper left stomach or shoulder Unusual bruising or bleeding Side effects that usually do not require medical attention (report to your care team if they continue or are bothersome): Bone pain Pain in the hands or feet This list may not describe all possible side effects. Call your doctor for medical advice about side effects. You may report side effects to FDA at 1-800-FDA-1088. Where should I keep my medication? Keep out of the reach of children. If you are using this medication at home, you will be instructed on how to store it. Throw away any unused medication after the expiration date on the label. NOTE: This sheet is a summary. It may not cover all possible information. If you have questions about this medicine, talk to your doctor, pharmacist, or health care provider.  2024  Elsevier/Gold Standard (2021-11-02 00:00:00)   To help prevent nausea and vomiting after your treatment, we encourage you to take your nausea medication as directed.  BELOW ARE SYMPTOMS THAT SHOULD BE REPORTED IMMEDIATELY: *FEVER GREATER THAN 100.4 F (38 C) OR HIGHER *CHILLS OR SWEATING *NAUSEA AND VOMITING THAT IS NOT CONTROLLED WITH YOUR NAUSEA MEDICATION *UNUSUAL SHORTNESS OF BREATH *UNUSUAL BRUISING OR BLEEDING *URINARY PROBLEMS (pain or burning when urinating, or frequent urination) *BOWEL PROBLEMS (unusual diarrhea, constipation, pain near the anus) TENDERNESS IN MOUTH AND THROAT WITH OR WITHOUT PRESENCE OF ULCERS (sore throat, sores in mouth, or a toothache) UNUSUAL RASH, SWELLING OR PAIN  UNUSUAL VAGINAL DISCHARGE OR ITCHING   Items with * indicate a potential emergency and should be followed up as soon as possible or go to the Emergency Department if any problems should occur.  Please show the CHEMOTHERAPY ALERT CARD or IMMUNOTHERAPY ALERT CARD at check-in to the Emergency Department and triage nurse.  Should you have questions after your visit or need to cancel or reschedule your appointment, please contact [facility]  Dept: [phone]  and follow the prompts.  Office hours are 8:00 a.m. to 4:30 p.m. Monday - Friday. Please note that voicemails left after 4:00 p.m. may not be  returned until the following business day.  We are closed weekends and major holidays. You have access to a nurse at all times for urgent questions. Please call the main number to the clinic Dept: [phone] and follow the prompts.   For any non-urgent questions, you may also contact your provider using MyChart. We now offer e-Visits for anyone 76 and older to request care online for non-urgent symptoms. For details visit [email or web address].   Also download the MyChart app! Go to the app store, search "MyChart", open the app, select [facility], and log in with your MyChart username and password.

## 2024-05-07 NOTE — Progress Notes (Signed)
 Patient presents today for Fulphila  injection. Patient tolerated injection in left arm with no complaints voiced.  Site clean and dry with no bruising or swelling noted.  No complaints of pain.  Discharged with vital signs stable and no signs or symptoms of distress noted.

## 2024-05-13 ENCOUNTER — Other Ambulatory Visit: Payer: Self-pay | Admitting: Oncology

## 2024-05-20 ENCOUNTER — Telehealth: Payer: Self-pay | Admitting: Nurse Practitioner

## 2024-05-20 ENCOUNTER — Encounter: Payer: Self-pay | Admitting: Nurse Practitioner

## 2024-05-20 ENCOUNTER — Inpatient Hospital Stay: Attending: Nurse Practitioner

## 2024-05-20 ENCOUNTER — Inpatient Hospital Stay (HOSPITAL_BASED_OUTPATIENT_CLINIC_OR_DEPARTMENT_OTHER): Admitting: Nurse Practitioner

## 2024-05-20 ENCOUNTER — Inpatient Hospital Stay

## 2024-05-20 VITALS — BP 109/73 | HR 51 | Temp 97.8°F | Resp 18

## 2024-05-20 VITALS — BP 119/74 | HR 64 | Temp 98.1°F | Resp 18 | Ht 72.0 in | Wt 190.5 lb

## 2024-05-20 DIAGNOSIS — Z5111 Encounter for antineoplastic chemotherapy: Secondary | ICD-10-CM | POA: Insufficient documentation

## 2024-05-20 DIAGNOSIS — C162 Malignant neoplasm of body of stomach: Secondary | ICD-10-CM

## 2024-05-20 DIAGNOSIS — C786 Secondary malignant neoplasm of retroperitoneum and peritoneum: Secondary | ICD-10-CM | POA: Diagnosis present

## 2024-05-20 DIAGNOSIS — T451X5A Adverse effect of antineoplastic and immunosuppressive drugs, initial encounter: Secondary | ICD-10-CM | POA: Insufficient documentation

## 2024-05-20 DIAGNOSIS — C169 Malignant neoplasm of stomach, unspecified: Secondary | ICD-10-CM

## 2024-05-20 DIAGNOSIS — G620 Drug-induced polyneuropathy: Secondary | ICD-10-CM | POA: Insufficient documentation

## 2024-05-20 LAB — CBC WITH DIFFERENTIAL (CANCER CENTER ONLY)
Abs Immature Granulocytes: 0.04 10*3/uL (ref 0.00–0.07)
Basophils Absolute: 0 10*3/uL (ref 0.0–0.1)
Basophils Relative: 0 %
Eosinophils Absolute: 0.3 10*3/uL (ref 0.0–0.5)
Eosinophils Relative: 5 %
HCT: 39.6 % (ref 39.0–52.0)
Hemoglobin: 13.1 g/dL (ref 13.0–17.0)
Immature Granulocytes: 1 %
Lymphocytes Relative: 15 %
Lymphs Abs: 1.1 10*3/uL (ref 0.7–4.0)
MCH: 32.7 pg (ref 26.0–34.0)
MCHC: 33.1 g/dL (ref 30.0–36.0)
MCV: 98.8 fL (ref 80.0–100.0)
Monocytes Absolute: 0.6 10*3/uL (ref 0.1–1.0)
Monocytes Relative: 8 %
Neutro Abs: 5.3 10*3/uL (ref 1.7–7.7)
Neutrophils Relative %: 71 %
Platelet Count: 145 10*3/uL — ABNORMAL LOW (ref 150–400)
RBC: 4.01 MIL/uL — ABNORMAL LOW (ref 4.22–5.81)
RDW: 18 % — ABNORMAL HIGH (ref 11.5–15.5)
WBC Count: 7.4 10*3/uL (ref 4.0–10.5)
nRBC: 0 % (ref 0.0–0.2)

## 2024-05-20 LAB — CMP (CANCER CENTER ONLY)
ALT: 41 U/L (ref 0–44)
AST: 36 U/L (ref 15–41)
Albumin: 4 g/dL (ref 3.5–5.0)
Alkaline Phosphatase: 172 U/L — ABNORMAL HIGH (ref 38–126)
Anion gap: 12 (ref 5–15)
BUN: 21 mg/dL — ABNORMAL HIGH (ref 6–20)
CO2: 22 mmol/L (ref 22–32)
Calcium: 9.5 mg/dL (ref 8.9–10.3)
Chloride: 106 mmol/L (ref 98–111)
Creatinine: 0.98 mg/dL (ref 0.61–1.24)
GFR, Estimated: >60 mL/min (ref >60)
Glucose, Bld: 105 mg/dL — ABNORMAL HIGH (ref 70–99)
Potassium: 4 mmol/L (ref 3.5–5.1)
Sodium: 140 mmol/L (ref 135–145)
Total Bilirubin: 0.2 mg/dL (ref 0.0–1.2)
Total Protein: 7.1 g/dL (ref 6.5–8.1)

## 2024-05-20 MED ORDER — PALONOSETRON HCL INJECTION 0.25 MG/5ML
0.2500 mg | Freq: Once | INTRAVENOUS | Status: AC
  Administered 2024-05-20: 0.25 mg via INTRAVENOUS
  Filled 2024-05-20: qty 5

## 2024-05-20 MED ORDER — HEPARIN SOD (PORK) LOCK FLUSH 100 UNIT/ML IV SOLN
500.0000 [IU] | Freq: Once | INTRAVENOUS | Status: AC | PRN
  Administered 2024-05-20: 500 [IU]

## 2024-05-20 MED ORDER — OLANZAPINE 5 MG PO TABS
5.0000 mg | ORAL_TABLET | Freq: Once | ORAL | Status: AC
  Administered 2024-05-20: 5 mg via ORAL
  Filled 2024-05-20: qty 1

## 2024-05-20 MED ORDER — SODIUM CHLORIDE 0.9 % IV SOLN: INTRAVENOUS | Status: DC

## 2024-05-20 MED ORDER — SCOPOLAMINE 1 MG/3DAYS TD PT72: 1.0000 | MEDICATED_PATCH | TRANSDERMAL | 2 refills | Status: DC

## 2024-05-20 MED ORDER — SODIUM CHLORIDE 0.9 % IV SOLN
150.0000 mg | Freq: Once | INTRAVENOUS | Status: AC
  Administered 2024-05-20: 150 mg via INTRAVENOUS
  Filled 2024-05-20: qty 150

## 2024-05-20 MED ORDER — OLANZAPINE 5 MG PO TABS: 5.0000 mg | ORAL_TABLET | ORAL | 2 refills | Status: DC

## 2024-05-20 MED ORDER — ZOLBETUXIMAB-CLZB CHEMO 100MG/5ML IV SOLN
387.0000 mg/m2 | Freq: Once | INTRAVENOUS | Status: AC
  Administered 2024-05-20: 800 mg via INTRAVENOUS
  Filled 2024-05-20: qty 40

## 2024-05-20 MED ORDER — DEXAMETHASONE SODIUM PHOSPHATE 10 MG/ML IJ SOLN
10.0000 mg | Freq: Once | INTRAMUSCULAR | Status: AC
  Administered 2024-05-20: 10 mg via INTRAVENOUS
  Filled 2024-05-20: qty 1

## 2024-05-20 MED ORDER — DEXAMETHASONE 4 MG PO TABS: 4.0000 mg | ORAL_TABLET | Freq: Every day | ORAL | 2 refills | Status: DC

## 2024-05-20 MED ORDER — SODIUM CHLORIDE 0.9% FLUSH
10.0000 mL | INTRAVENOUS | Status: DC | PRN
  Administered 2024-05-20: 10 mL

## 2024-05-20 NOTE — Progress Notes (Signed)
  Cancer Center OFFICE PROGRESS NOTE   Diagnosis: Gastric cancer  INTERVAL HISTORY:   Mr. Milhorn returns as scheduled.  He completed another cycle of oxaliplatin /zolbetuximab 05/05/2024.  He denies nausea/vomiting.  No mouth sores.  No diarrhea.  He denies pain.  Appetite described as "very good".  No signs of allergic reaction.  No rash.  He reports persistent cold sensitivity in his hands.  Symptoms are very bothersome.  Objective:  Vital signs in last 24 hours:  Blood pressure 119/74, pulse 64, temperature 98.1 F (36.7 C), temperature source Temporal, resp. rate 18, height 6' (1.829 m), weight 190 lb 8 oz (86.4 kg), SpO2 98%.    HEENT: No thrush or ulcers. Resp: Lungs clear bilaterally. Cardio: Regular rate and rhythm. GI: No hepatosplenomegaly.  No mass.  Nontender.  No apparent ascites. Vascular: No leg edema. Neuro: Vibratory sense mildly decreased over the fingertips per tuning fork exam. Skin: No rash. Port-A-Cath without erythema.  Lab Results:  Lab Results  Component Value Date   WBC 7.4 05/20/2024   HGB 13.1 05/20/2024   HCT 39.6 05/20/2024   MCV 98.8 05/20/2024   PLT 145 (L) 05/20/2024   NEUTROABS 5.3 05/20/2024    Imaging:  No results found.  Medications: I have reviewed the patient's current medications.  Assessment/Plan: Gastric cancer 11/21/2023 EGD-gastric body with "infiltrative looking lesion"; biopsy shows involvement of a hypercellular lesion that suggests diffuse carcinoma by morphology CTs abdomen/pelvis 11/28/2023-diffuse fatty infiltration of the liver; gastric wall thickening; lymph nodes up to 6 mm in diameter near the stomach wall. 12/31/2023 upper endoscopy-large diffuse friable infiltrative polypoid and ulcerated circumferential mass found in the gastric body.  Scope was passed beyond the mass with normal antrum/pylorus.  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  Negative for HER2 (1+); mismatch repair protein IHC normal; PD-L1 CPS 0%; CLDN18 positive: 90% of tumor cells with 2+/3+ membrane staining PET scan 01/07/2024-circumferential hypermetabolic gastric mucosal thickening.  Ill-defined hypermetabolic lymph nodes in the gastrohepatic ligament.  Intense hypermetabolic activity associated with the right lobe of the thyroid  gland.  Small hypermetabolic right axillary node favored reactive. 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  Gastric wall thickening slightly improved. Cycle 7 oxaliplatin , zolbetuximab 05/05/2024, Fulphila  Cycle 8 zolbetuximab 05/20/2024, oxaliplatin  held due to neuropathy Early satiety, postprandial abdominal pain, weight loss secondary to #1 History of bilateral lower extremity DVT 2021-anticoagulation discontinued due to massive retroperitoneal bleed, IVC filter placed 08/24/2020 Hospitalized with acute respiratory failure due to COVID-19 08/06/2020 - 09/04/2020 History of massive retroperitoneal bleed secondary to anticoagulation September 2021 Thyroid  ultrasound 01/13/2024-no abnormal nodule identified in the right lobe.  1.1 cm thyroid  isthmus nodule meets criteria for 1 year follow-up ultrasound. Admission 02/01/2024 with increased abdominal pain/ascites Hospitalization with altered mental status 02/05/2024 through 02/13/2024; question rare  case of 5-FU psychosis.  Improved 02/16/2024.  Mental status at baseline 02/23/2024. Neutropenia 02/16/2024 Mucositis 02/16/2024.  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  Persistent cold sensitivity 05/20/2024.    Disposition: Henry May appears stable.  He has completed 7 cycles of oxaliplatin /zolbetuximab.  There is no clinical evidence of disease progression.  He has developed persistent cold sensitivity and is very bothered by this.  We decided to hold oxaliplatin  today and proceed with zolbetuximab alone.  He agrees with this plan.  CBC and chemistry panel reviewed.  Labs adequate to proceed with zolbetuximab.  He will return for follow-up and treatment in 2 weeks.  We are available to see him sooner if needed.    Diana Forster ANP/GNP-BC   05/20/2024  8:48 AM

## 2024-05-20 NOTE — Patient Instructions (Signed)
 [facility] - A DEPT OF [facility]   Discharge Instructions: Thank you for choosing [facility] to provide your oncology and hematology care.   If you have a lab appointment with the Cancer Center, please go directly to the Cancer Center and check in at the registration area.   Wear comfortable clothing and clothing appropriate for easy access to any Portacath or PICC line.   We strive to give you quality time with your provider. You may need to reschedule your appointment if you arrive late (15 or more minutes).  Arriving late affects you and other patients whose appointments are after yours.  Also, if you miss three or more appointments without notifying the office, you may be dismissed from the clinic at the provider's discretion.      For prescription refill requests, have your pharmacy contact our office and allow 72 hours for refills to be completed.    Today you received the following chemotherapy and/or immunotherapy agents VYLOY     To help prevent nausea and vomiting after your treatment, we encourage you to take your nausea medication as directed.  BELOW ARE SYMPTOMS THAT SHOULD BE REPORTED IMMEDIATELY: *FEVER GREATER THAN 100.4 F (38 C) OR HIGHER *CHILLS OR SWEATING *NAUSEA AND VOMITING THAT IS NOT CONTROLLED WITH YOUR NAUSEA MEDICATION *UNUSUAL SHORTNESS OF BREATH *UNUSUAL BRUISING OR BLEEDING *URINARY PROBLEMS (pain or burning when urinating, or frequent urination) *BOWEL PROBLEMS (unusual diarrhea, constipation, pain near the anus) TENDERNESS IN MOUTH AND THROAT WITH OR WITHOUT PRESENCE OF ULCERS (sore throat, sores in mouth, or a toothache) UNUSUAL RASH, SWELLING OR PAIN  UNUSUAL VAGINAL DISCHARGE OR ITCHING   Items with * indicate a potential emergency and should be followed up as soon as possible or go to the Emergency Department if any problems should occur.  Please show the CHEMOTHERAPY ALERT CARD or IMMUNOTHERAPY  ALERT CARD at check-in to the Emergency Department and triage nurse.  Should you have questions after your visit or need to cancel or reschedule your appointment, please contact [facility]  Dept: [phone]  and follow the prompts.  Office hours are 8:00 a.m. to 4:30 p.m. Monday - Friday. Please note that voicemails left after 4:00 p.m. may not be returned until the following business day.  We are closed weekends and major holidays. You have access to a nurse at all times for urgent questions. Please call the main number to the clinic Dept: [phone] and follow the prompts.   For any non-urgent questions, you may also contact your provider using MyChart. We now offer e-Visits for anyone 96 and older to request care online for non-urgent symptoms. For details visit [email or web address].   Also download the MyChart app! Go to the app store, search "MyChart", open the app, select [place], and log in with your MyChart username and password.  Oxaliplatin  Injection What is this medication? OXALIPLATIN  (ox AL i PLA tin) treats colorectal cancer. It works by slowing down the growth of cancer cells. This medicine may be used for other purposes; ask your health care provider or pharmacist if you have questions. COMMON BRAND NAME(S): Eloxatin  What should I tell my care team before I take this medication? They need to know if you have any of these conditions: Heart disease History of irregular heartbeat or rhythm Liver disease Low blood cell levels (white cells, red cells, and platelets) Lung or breathing disease, such  as asthma Take medications that treat or prevent blood clots Tingling of the fingers, toes, or other nerve disorder An unusual or allergic reaction to oxaliplatin , other medications, foods, dyes, or preservatives If you or your partner are pregnant or trying to get pregnant Breast-feeding How should I use this  medication? This medication is injected into a vein. It is given by your care team in a hospital or clinic setting. Talk to your care team about the use of this medication in children. Special care may be needed. Overdosage: If you think you have taken too much of this medicine contact a poison control center or emergency room at once. NOTE: This medicine is only for you. Do not share this medicine with others. What if I miss a dose? Keep appointments for follow-up doses. It is important not to miss a dose. Call your care team if you are unable to keep an appointment. What may interact with this medication? Do not take this medication with any of the following: Cisapride Dronedarone Pimozide Thioridazine This medication may also interact with the following: Aspirin and aspirin-like medications Certain medications that treat or prevent blood clots, such as warfarin, apixaban, dabigatran, and rivaroxaban Cisplatin Cyclosporine Diuretics Medications for infection, such as acyclovir, adefovir, amphotericin B, bacitracin, cidofovir, foscarnet, ganciclovir, gentamicin, pentamidine, vancomycin  NSAIDs, medications for pain and inflammation, such as ibuprofen or naproxen Other medications that cause heart rhythm changes Pamidronate Zoledronic acid This list may not describe all possible interactions. Give your health care provider a list of all the medicines, herbs, non-prescription drugs, or dietary supplements you use. Also tell them if you smoke, drink alcohol, or use illegal drugs. Some items may interact with your medicine. What should I watch for while using this medication? Your condition will be monitored carefully while you are receiving this medication. You may need blood work while taking this medication. This medication may make you feel generally unwell. This is not uncommon as chemotherapy can affect healthy cells as well as cancer cells. Report any side effects. Continue your course  of treatment even though you feel ill unless your care team tells you to stop. This medication may increase your risk of getting an infection. Call your care team for advice if you get a fever, chills, sore throat, or other symptoms of a cold or flu. Do not treat yourself. Try to avoid being around people who are sick. Avoid taking medications that contain aspirin, acetaminophen , ibuprofen, naproxen, or ketoprofen unless instructed by your care team. These medications may hide a fever. Be careful brushing or flossing your teeth or using a toothpick because you may get an infection or bleed more easily. If you have any dental work done, tell your dentist you are receiving this medication. This medication can make you more sensitive to cold. Do not drink cold drinks or use ice. Cover exposed skin before coming in contact with cold temperatures or cold objects. When out in cold weather wear warm clothing and cover your mouth and nose to warm the air that goes into your lungs. Tell your care team if you get sensitive to the cold. Talk to your care team if you or your partner are pregnant or think either of you might be pregnant. This medication can cause serious birth defects if taken during pregnancy and for 9 months after the last dose. A negative pregnancy test is required before starting this medication. A reliable form of contraception is recommended while taking this medication and for 9 months after the  last dose. Talk to your care team about effective forms of contraception. Do not father a child while taking this medication and for 6 months after the last dose. Use a condom while having sex during this time period. Do not breastfeed while taking this medication and for 3 months after the last dose. This medication may cause infertility. Talk to your care team if you are concerned about your fertility. What side effects may I notice from receiving this medication? Side effects that you should report to  your care team as soon as possible: Allergic reactions--skin rash, itching, hives, swelling of the face, lips, tongue, or throat Bleeding--bloody or black, tar-like stools, vomiting blood or brown material that looks like coffee grounds, red or dark brown urine, small red or purple spots on skin, unusual bruising or bleeding Dry cough, shortness of breath or trouble breathing Heart rhythm changes--fast or irregular heartbeat, dizziness, feeling faint or lightheaded, chest pain, trouble breathing Infection--fever, chills, cough, sore throat, wounds that don't heal, pain or trouble when passing urine, general feeling of discomfort or being unwell Liver injury--right upper belly pain, loss of appetite, nausea, light-colored stool, dark yellow or brown urine, yellowing skin or eyes, unusual weakness or fatigue Low red blood cell level--unusual weakness or fatigue, dizziness, headache, trouble breathing Muscle injury--unusual weakness or fatigue, muscle pain, dark yellow or brown urine, decrease in amount of urine Pain, tingling, or numbness in the hands or feet Sudden and severe headache, confusion, change in vision, seizures, which may be signs of posterior reversible encephalopathy syndrome (PRES) Unusual bruising or bleeding Side effects that usually do not require medical attention (report to your care team if they continue or are bothersome): Diarrhea Nausea Pain, redness, or swelling with sores inside the mouth or throat Unusual weakness or fatigue Vomiting This list may not describe all possible side effects. Call your doctor for medical advice about side effects. You may report side effects to FDA at 1-800-FDA-1088. Where should I keep my medication? This medication is given in a hospital or clinic. It will not be stored at home. NOTE: This sheet is a summary. It may not cover all possible information. If you have questions about this medicine, talk to your doctor, pharmacist, or health care  provider.  2024 Elsevier/Gold Standard (2023-11-14 00:00:00)

## 2024-05-20 NOTE — Telephone Encounter (Signed)
 Patient has been scheduled for follow-up visit per 05/19/24 LOS.  LVM notifying pt of appt details, provided my direct number to pt if appt changes need to be made.

## 2024-05-20 NOTE — Progress Notes (Signed)
 Patient seen by Lonna Cobb NP today  Vitals are within treatment parameters:Yes   Labs are within treatment parameters: Yes   Treatment plan has been signed: Yes   Per physician team, Patient is ready for treatment and there are NO modifications to the treatment plan.

## 2024-05-21 ENCOUNTER — Other Ambulatory Visit: Payer: Self-pay

## 2024-05-22 ENCOUNTER — Ambulatory Visit

## 2024-05-26 ENCOUNTER — Other Ambulatory Visit: Payer: Self-pay

## 2024-05-28 ENCOUNTER — Other Ambulatory Visit: Payer: Self-pay | Admitting: Oncology

## 2024-06-03 ENCOUNTER — Encounter: Payer: Self-pay | Admitting: Nurse Practitioner

## 2024-06-03 ENCOUNTER — Inpatient Hospital Stay

## 2024-06-03 ENCOUNTER — Inpatient Hospital Stay (HOSPITAL_BASED_OUTPATIENT_CLINIC_OR_DEPARTMENT_OTHER): Admitting: Nurse Practitioner

## 2024-06-03 VITALS — BP 119/66 | HR 60 | Temp 97.8°F | Resp 18 | Ht 72.0 in | Wt 197.0 lb

## 2024-06-03 VITALS — BP 107/64 | HR 52 | Temp 97.7°F | Resp 18

## 2024-06-03 DIAGNOSIS — Z5111 Encounter for antineoplastic chemotherapy: Secondary | ICD-10-CM | POA: Diagnosis not present

## 2024-06-03 DIAGNOSIS — C169 Malignant neoplasm of stomach, unspecified: Secondary | ICD-10-CM

## 2024-06-03 LAB — CBC WITH DIFFERENTIAL (CANCER CENTER ONLY)
Abs Immature Granulocytes: 0.01 10*3/uL (ref 0.00–0.07)
Basophils Absolute: 0.1 10*3/uL (ref 0.0–0.1)
Basophils Relative: 1 %
Eosinophils Absolute: 0.4 10*3/uL (ref 0.0–0.5)
Eosinophils Relative: 5 %
HCT: 39.5 % (ref 39.0–52.0)
Hemoglobin: 13.3 g/dL (ref 13.0–17.0)
Immature Granulocytes: 0 %
Lymphocytes Relative: 21 %
Lymphs Abs: 1.4 10*3/uL (ref 0.7–4.0)
MCH: 33.3 pg (ref 26.0–34.0)
MCHC: 33.7 g/dL (ref 30.0–36.0)
MCV: 98.8 fL (ref 80.0–100.0)
Monocytes Absolute: 0.6 10*3/uL (ref 0.1–1.0)
Monocytes Relative: 9 %
Neutro Abs: 4.2 10*3/uL (ref 1.7–7.7)
Neutrophils Relative %: 64 %
Platelet Count: 184 10*3/uL (ref 150–400)
RBC: 4 MIL/uL — ABNORMAL LOW (ref 4.22–5.81)
RDW: 15.1 % (ref 11.5–15.5)
WBC Count: 6.6 10*3/uL (ref 4.0–10.5)
nRBC: 0 % (ref 0.0–0.2)

## 2024-06-03 LAB — CMP (CANCER CENTER ONLY)
ALT: 29 U/L (ref 0–44)
AST: 32 U/L (ref 15–41)
Albumin: 4 g/dL (ref 3.5–5.0)
Alkaline Phosphatase: 131 U/L — ABNORMAL HIGH (ref 38–126)
Anion gap: 11 (ref 5–15)
BUN: 15 mg/dL (ref 6–20)
CO2: 24 mmol/L (ref 22–32)
Calcium: 9.4 mg/dL (ref 8.9–10.3)
Chloride: 104 mmol/L (ref 98–111)
Creatinine: 0.87 mg/dL (ref 0.61–1.24)
GFR, Estimated: >60 mL/min (ref >60)
Glucose, Bld: 98 mg/dL (ref 70–99)
Potassium: 4.5 mmol/L (ref 3.5–5.1)
Sodium: 140 mmol/L (ref 135–145)
Total Bilirubin: <0.2 mg/dL (ref 0.0–1.2)
Total Protein: 6.9 g/dL (ref 6.5–8.1)

## 2024-06-03 MED ORDER — SODIUM CHLORIDE 0.9 % IV SOLN
150.0000 mg | Freq: Once | INTRAVENOUS | Status: AC
  Administered 2024-06-03: 150 mg via INTRAVENOUS
  Filled 2024-06-03: qty 150

## 2024-06-03 MED ORDER — DEXTROSE 5 % IV SOLN: INTRAVENOUS | Status: DC

## 2024-06-03 MED ORDER — ZOLBETUXIMAB-CLZB CHEMO 100MG/5ML IV SOLN
800.0000 mg | Freq: Once | INTRAVENOUS | Status: AC
  Administered 2024-06-03: 800 mg via INTRAVENOUS
  Filled 2024-06-03: qty 30

## 2024-06-03 MED ORDER — OLANZAPINE 5 MG PO TABS
5.0000 mg | ORAL_TABLET | Freq: Once | ORAL | Status: AC
  Administered 2024-06-03: 5 mg via ORAL
  Filled 2024-06-03: qty 1

## 2024-06-03 MED ORDER — HEPARIN SOD (PORK) LOCK FLUSH 100 UNIT/ML IV SOLN
500.0000 [IU] | Freq: Once | INTRAVENOUS | Status: AC | PRN
  Administered 2024-06-03: 500 [IU]

## 2024-06-03 MED ORDER — SODIUM CHLORIDE 0.9 % IV SOLN: INTRAVENOUS | Status: DC

## 2024-06-03 MED ORDER — OXALIPLATIN CHEMO INJECTION 100 MG/20ML
85.0000 mg/m2 | Freq: Once | INTRAVENOUS | Status: AC
  Administered 2024-06-03: 175 mg via INTRAVENOUS
  Filled 2024-06-03: qty 35

## 2024-06-03 MED ORDER — PALONOSETRON HCL INJECTION 0.25 MG/5ML
0.2500 mg | Freq: Once | INTRAVENOUS | Status: AC
  Administered 2024-06-03: 0.25 mg via INTRAVENOUS
  Filled 2024-06-03: qty 5

## 2024-06-03 MED ORDER — SODIUM CHLORIDE 0.9% FLUSH
10.0000 mL | INTRAVENOUS | Status: DC | PRN
  Administered 2024-06-03: 10 mL

## 2024-06-03 MED ORDER — DEXAMETHASONE SODIUM PHOSPHATE 10 MG/ML IJ SOLN
10.0000 mg | Freq: Once | INTRAMUSCULAR | Status: AC
  Administered 2024-06-03: 10 mg via INTRAVENOUS
  Filled 2024-06-03: qty 1

## 2024-06-03 NOTE — Patient Instructions (Signed)
 [facility] - A DEPT OF [facility]   Discharge Instructions: Thank you for choosing [facility] to provide your oncology and hematology care.   If you have a lab appointment with the Cancer Center, please go directly to the Cancer Center and check in at the registration area.   Wear comfortable clothing and clothing appropriate for easy access to any Portacath or PICC line.   We strive to give you quality time with your provider. You may need to reschedule your appointment if you arrive late (15 or more minutes).  Arriving late affects you and other patients whose appointments are after yours.  Also, if you miss three or more appointments without notifying the office, you may be dismissed from the clinic at the provider's discretion.      For prescription refill requests, have your pharmacy contact our office and allow 72 hours for refills to be completed.    Today you received the following chemotherapy and/or immunotherapy agents VYLOY  and Oxaliplatin .   To help prevent nausea and vomiting after your treatment, we encourage you to take your nausea medication as directed.  BELOW ARE SYMPTOMS THAT SHOULD BE REPORTED IMMEDIATELY: *FEVER GREATER THAN 100.4 F (38 C) OR HIGHER *CHILLS OR SWEATING *NAUSEA AND VOMITING THAT IS NOT CONTROLLED WITH YOUR NAUSEA MEDICATION *UNUSUAL SHORTNESS OF BREATH *UNUSUAL BRUISING OR BLEEDING *URINARY PROBLEMS (pain or burning when urinating, or frequent urination) *BOWEL PROBLEMS (unusual diarrhea, constipation, pain near the anus) TENDERNESS IN MOUTH AND THROAT WITH OR WITHOUT PRESENCE OF ULCERS (sore throat, sores in mouth, or a toothache) UNUSUAL RASH, SWELLING OR PAIN  UNUSUAL VAGINAL DISCHARGE OR ITCHING   Items with * indicate a potential emergency and should be followed up as soon as possible or go to the Emergency Department if any problems should occur.  Please show the CHEMOTHERAPY ALERT CARD or  IMMUNOTHERAPY ALERT CARD at check-in to the Emergency Department and triage nurse.  Should you have questions after your visit or need to cancel or reschedule your appointment, please contact [facility]  Dept: [phone]  and follow the prompts.  Office hours are 8:00 a.m. to 4:30 p.m. Monday - Friday. Please note that voicemails left after 4:00 p.m. may not be returned until the following business day.  We are closed weekends and major holidays. You have access to a nurse at all times for urgent questions. Please call the main number to the clinic Dept: [phone] and follow the prompts.   For any non-urgent questions, you may also contact your provider using MyChart. We now offer e-Visits for anyone 45 and older to request care online for non-urgent symptoms. For details visit [email or web address].   Also download the MyChart app! Go to the app store, search MyChart, open the app, select [place], and log in with your MyChart username and password.  Oxaliplatin  Injection What is this medication? OXALIPLATIN  (ox AL i PLA tin) treats colorectal cancer. It works by slowing down the growth of cancer cells. This medicine may be used for other purposes; ask your health care provider or pharmacist if you have questions. COMMON BRAND NAME(S): Eloxatin  What should I tell my care team before I take this medication? They need to know if you have any of these conditions: Heart disease History of irregular heartbeat or rhythm Liver disease Low blood cell levels (white cells, red cells, and platelets) Lung or breathing disease, such  as asthma Take medications that treat or prevent blood clots Tingling of the fingers, toes, or other nerve disorder An unusual or allergic reaction to oxaliplatin , other medications, foods, dyes, or preservatives If you or your partner are pregnant or trying to get pregnant Breast-feeding How should I use  this medication? This medication is injected into a vein. It is given by your care team in a hospital or clinic setting. Talk to your care team about the use of this medication in children. Special care may be needed. Overdosage: If you think you have taken too much of this medicine contact a poison control center or emergency room at once. NOTE: This medicine is only for you. Do not share this medicine with others. What if I miss a dose? Keep appointments for follow-up doses. It is important not to miss a dose. Call your care team if you are unable to keep an appointment. What may interact with this medication? Do not take this medication with any of the following: Cisapride Dronedarone Pimozide Thioridazine This medication may also interact with the following: Aspirin and aspirin-like medications Certain medications that treat or prevent blood clots, such as warfarin, apixaban, dabigatran, and rivaroxaban Cisplatin Cyclosporine Diuretics Medications for infection, such as acyclovir, adefovir, amphotericin B, bacitracin, cidofovir, foscarnet, ganciclovir, gentamicin, pentamidine, vancomycin  NSAIDs, medications for pain and inflammation, such as ibuprofen or naproxen Other medications that cause heart rhythm changes Pamidronate Zoledronic acid This list may not describe all possible interactions. Give your health care provider a list of all the medicines, herbs, non-prescription drugs, or dietary supplements you use. Also tell them if you smoke, drink alcohol, or use illegal drugs. Some items may interact with your medicine. What should I watch for while using this medication? Your condition will be monitored carefully while you are receiving this medication. You may need blood work while taking this medication. This medication may make you feel generally unwell. This is not uncommon as chemotherapy can affect healthy cells as well as cancer cells. Report any side effects. Continue your  course of treatment even though you feel ill unless your care team tells you to stop. This medication may increase your risk of getting an infection. Call your care team for advice if you get a fever, chills, sore throat, or other symptoms of a cold or flu. Do not treat yourself. Try to avoid being around people who are sick. Avoid taking medications that contain aspirin, acetaminophen , ibuprofen, naproxen, or ketoprofen unless instructed by your care team. These medications may hide a fever. Be careful brushing or flossing your teeth or using a toothpick because you may get an infection or bleed more easily. If you have any dental work done, tell your dentist you are receiving this medication. This medication can make you more sensitive to cold. Do not drink cold drinks or use ice. Cover exposed skin before coming in contact with cold temperatures or cold objects. When out in cold weather wear warm clothing and cover your mouth and nose to warm the air that goes into your lungs. Tell your care team if you get sensitive to the cold. Talk to your care team if you or your partner are pregnant or think either of you might be pregnant. This medication can cause serious birth defects if taken during pregnancy and for 9 months after the last dose. A negative pregnancy test is required before starting this medication. A reliable form of contraception is recommended while taking this medication and for 9 months after the  last dose. Talk to your care team about effective forms of contraception. Do not father a child while taking this medication and for 6 months after the last dose. Use a condom while having sex during this time period. Do not breastfeed while taking this medication and for 3 months after the last dose. This medication may cause infertility. Talk to your care team if you are concerned about your fertility. What side effects may I notice from receiving this medication? Side effects that you should  report to your care team as soon as possible: Allergic reactions--skin rash, itching, hives, swelling of the face, lips, tongue, or throat Bleeding--bloody or black, tar-like stools, vomiting blood or brown material that looks like coffee grounds, red or dark brown urine, small red or purple spots on skin, unusual bruising or bleeding Dry cough, shortness of breath or trouble breathing Heart rhythm changes--fast or irregular heartbeat, dizziness, feeling faint or lightheaded, chest pain, trouble breathing Infection--fever, chills, cough, sore throat, wounds that don't heal, pain or trouble when passing urine, general feeling of discomfort or being unwell Liver injury--right upper belly pain, loss of appetite, nausea, light-colored stool, dark yellow or brown urine, yellowing skin or eyes, unusual weakness or fatigue Low red blood cell level--unusual weakness or fatigue, dizziness, headache, trouble breathing Muscle injury--unusual weakness or fatigue, muscle pain, dark yellow or brown urine, decrease in amount of urine Pain, tingling, or numbness in the hands or feet Sudden and severe headache, confusion, change in vision, seizures, which may be signs of posterior reversible encephalopathy syndrome (PRES) Unusual bruising or bleeding Side effects that usually do not require medical attention (report to your care team if they continue or are bothersome): Diarrhea Nausea Pain, redness, or swelling with sores inside the mouth or throat Unusual weakness or fatigue Vomiting This list may not describe all possible side effects. Call your doctor for medical advice about side effects. You may report side effects to FDA at 1-800-FDA-1088. Where should I keep my medication? This medication is given in a hospital or clinic. It will not be stored at home. NOTE: This sheet is a summary. It may not cover all possible information. If you have questions about this medicine, talk to your doctor, pharmacist, or  health care provider.  2024 Elsevier/Gold Standard (2023-11-14 00:00:00)

## 2024-06-03 NOTE — Progress Notes (Signed)
 Patient seen by Diana Forster NP today  Vitals are within treatment parameters:Yes   Increased 7 pounds Labs are within treatment parameters: Yes   Treatment plan has been signed: Yes   Per physician team, Patient is ready for treatment and there are NO modifications to the treatment plan.

## 2024-06-03 NOTE — Progress Notes (Signed)
 [facility] OFFICE PROGRESS NOTE   Diagnosis: Gastric cancer  INTERVAL HISTORY:   Mr. Chaikin he returns as scheduled.  He completed another cycle of zolbetuximab 05/20/2024.  Oxaliplatin  was held due to persistent cold sensitivity.  The cold sensitivity has resolved.  No numbness or ting in the absence of cold exposure.  He has a good appetite.  No abdominal pain.  No rash.  No nausea or vomiting.  No mouth sores.  Objective:  Vital signs in last 24 hours:  Blood pressure 119/66, pulse 60, temperature 97.8 F (36.6 C), temperature source Temporal, resp. rate 18, height 6' (1.829 m), weight 197 lb (89.4 kg), SpO2 100%.    HEENT: No thrush or ulcers. Resp: Lungs clear bilaterally. Cardio: Regular rate and rhythm. GI: No hepatosplenomegaly.  No mass. Vascular: No leg edema. Neuro: Alert and oriented.  Vibratory sense very minimally decreased over the fingertips per tuning fork exam. Skin: No rash. Port-A-Cath without erythema.  Lab Results:  Lab Results  Component Value Date   WBC 6.6 06/03/2024   HGB 13.3 06/03/2024   HCT 39.5 06/03/2024   MCV 98.8 06/03/2024   PLT 184 06/03/2024   NEUTROABS 4.2 06/03/2024    Imaging:  No results found.  Medications: I have reviewed the patient's current medications.  Assessment/Plan: Gastric cancer 11/21/2023 EGD-gastric body with infiltrative looking lesion; biopsy shows involvement of a hypercellular lesion that suggests diffuse carcinoma by morphology CTs abdomen/pelvis 11/28/2023-diffuse fatty infiltration of the liver; gastric wall thickening; lymph nodes up to 6 mm in diameter near the stomach wall. 12/31/2023 upper endoscopy-large diffuse friable infiltrative polypoid and ulcerated circumferential mass found in the gastric body.  Scope was passed beyond the mass with normal antrum/pylorus.  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   Negative for HER2 (1+); mismatch repair protein IHC normal; PD-L1 CPS 0%; CLDN18 positive: 90% of tumor cells with 2+/3+ membrane staining PET scan 01/07/2024-circumferential hypermetabolic gastric mucosal thickening.  Ill-defined hypermetabolic lymph nodes in the gastrohepatic ligament.  Intense hypermetabolic activity associated with the right lobe of the thyroid  gland.  Small hypermetabolic right axillary node favored reactive. 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  Gastric wall thickening slightly improved. Cycle 7 oxaliplatin , zolbetuximab 05/05/2024, Fulphila  Cycle 8 zolbetuximab 05/20/2024, oxaliplatin  held due to neuropathy, no Fulphila  Cycle 9 oxaliplatin /zolbetuximab 06/03/2024, Fulphila  Early satiety, postprandial abdominal pain, weight loss secondary to #1 History of bilateral lower extremity DVT 2021-anticoagulation discontinued due to massive retroperitoneal bleed, IVC filter placed 08/24/2020 Hospitalized with acute respiratory failure due to COVID-19 08/06/2020 - 09/04/2020 History of massive retroperitoneal bleed secondary to anticoagulation September 2021 Thyroid  ultrasound 01/13/2024-no abnormal nodule identified in the right lobe.  1.1 cm thyroid  isthmus nodule meets criteria for 1 year follow-up ultrasound. Admission 02/01/2024 with increased abdominal pain/ascites Hospitalization with altered  mental status 02/05/2024 through 02/13/2024; question rare case of 5-FU psychosis.  Improved 02/16/2024.  Mental status at baseline 02/23/2024. Neutropenia 02/16/2024 Mucositis 02/16/2024.  Resolved 02/23/2024 Right knee pain/edema/erythema 03/01/2024-Doppler study negative for DVT History of gout Oxaliplatin  neuropathy-prolonged cold sensitivity,  diminished vibratory sense following cycle 5 chemotherapy.  Persistent cold sensitivity 05/20/2024, oxaliplatin  held.  Cold sensitivity resolved 06/03/2024, oxaliplatin  resumed.    Disposition: Mr. Courville appears stable.  He completed a cycle of zolbetuximab alone 05/20/2024..  Oxaliplatin  was held due to persistent cold sensitivity.  The cold sensitivity has resolved.  Plan to proceed with cycle 9 zolbetuximab/oxaliplatin  today as scheduled.  He and his wife agree with this plan.  CBC and chemistry panel reviewed.  Labs adequate for treatment.  He will return for follow-up and treatment in 2 weeks.  We are available to see him sooner if needed.    Diana Forster ANP/GNP-BC   06/03/2024  8:14 AM

## 2024-06-04 ENCOUNTER — Inpatient Hospital Stay

## 2024-06-04 VITALS — BP 123/72 | HR 63 | Temp 99.0°F | Resp 18

## 2024-06-04 DIAGNOSIS — Z5111 Encounter for antineoplastic chemotherapy: Secondary | ICD-10-CM | POA: Diagnosis not present

## 2024-06-04 DIAGNOSIS — C169 Malignant neoplasm of stomach, unspecified: Secondary | ICD-10-CM

## 2024-06-04 MED ORDER — PEGFILGRASTIM-JMDB 6 MG/0.6ML ~~LOC~~ SOSY
6.0000 mg | PREFILLED_SYRINGE | Freq: Once | SUBCUTANEOUS | Status: AC
  Administered 2024-06-04: 6 mg via SUBCUTANEOUS
  Filled 2024-06-04: qty 0.6

## 2024-06-04 NOTE — Patient Instructions (Signed)

## 2024-06-08 ENCOUNTER — Other Ambulatory Visit: Payer: Self-pay

## 2024-06-12 ENCOUNTER — Other Ambulatory Visit: Payer: Self-pay | Admitting: Oncology

## 2024-06-16 ENCOUNTER — Inpatient Hospital Stay

## 2024-06-16 ENCOUNTER — Inpatient Hospital Stay: Attending: Nurse Practitioner | Admitting: Oncology

## 2024-06-16 ENCOUNTER — Telehealth: Payer: Self-pay | Admitting: *Deleted

## 2024-06-16 VITALS — BP 121/74 | HR 60 | Temp 98.2°F | Resp 18 | Ht 72.0 in | Wt 195.0 lb

## 2024-06-16 DIAGNOSIS — D709 Neutropenia, unspecified: Secondary | ICD-10-CM | POA: Diagnosis not present

## 2024-06-16 DIAGNOSIS — C169 Malignant neoplasm of stomach, unspecified: Secondary | ICD-10-CM

## 2024-06-16 DIAGNOSIS — C786 Secondary malignant neoplasm of retroperitoneum and peritoneum: Secondary | ICD-10-CM | POA: Diagnosis present

## 2024-06-16 DIAGNOSIS — C162 Malignant neoplasm of body of stomach: Secondary | ICD-10-CM | POA: Diagnosis present

## 2024-06-16 DIAGNOSIS — Z5111 Encounter for antineoplastic chemotherapy: Secondary | ICD-10-CM | POA: Insufficient documentation

## 2024-06-16 DIAGNOSIS — Z95828 Presence of other vascular implants and grafts: Secondary | ICD-10-CM

## 2024-06-16 LAB — CBC WITH DIFFERENTIAL (CANCER CENTER ONLY)
Abs Immature Granulocytes: 0.04 10*3/uL (ref 0.00–0.07)
Basophils Absolute: 0 10*3/uL (ref 0.0–0.1)
Basophils Relative: 0 %
Eosinophils Absolute: 0.4 10*3/uL (ref 0.0–0.5)
Eosinophils Relative: 4 %
HCT: 41.7 % (ref 39.0–52.0)
Hemoglobin: 13.7 g/dL (ref 13.0–17.0)
Immature Granulocytes: 0 %
Lymphocytes Relative: 13 %
Lymphs Abs: 1.4 10*3/uL (ref 0.7–4.0)
MCH: 32.8 pg (ref 26.0–34.0)
MCHC: 32.9 g/dL (ref 30.0–36.0)
MCV: 99.8 fL (ref 80.0–100.0)
Monocytes Absolute: 0.5 10*3/uL (ref 0.1–1.0)
Monocytes Relative: 5 %
Neutro Abs: 8.4 10*3/uL — ABNORMAL HIGH (ref 1.7–7.7)
Neutrophils Relative %: 78 %
Platelet Count: 115 10*3/uL — ABNORMAL LOW (ref 150–400)
RBC: 4.18 MIL/uL — ABNORMAL LOW (ref 4.22–5.81)
RDW: 14 % (ref 11.5–15.5)
WBC Count: 10.7 10*3/uL — ABNORMAL HIGH (ref 4.0–10.5)
nRBC: 0 % (ref 0.0–0.2)

## 2024-06-16 LAB — CMP (CANCER CENTER ONLY)
ALT: 28 U/L (ref 0–44)
AST: 32 U/L (ref 15–41)
Albumin: 4 g/dL (ref 3.5–5.0)
Alkaline Phosphatase: 161 U/L — ABNORMAL HIGH (ref 38–126)
Anion gap: 10 (ref 5–15)
BUN: 17 mg/dL (ref 6–20)
CO2: 24 mmol/L (ref 22–32)
Calcium: 9.5 mg/dL (ref 8.9–10.3)
Chloride: 107 mmol/L (ref 98–111)
Creatinine: 1.01 mg/dL (ref 0.61–1.24)
GFR, Estimated: >60 mL/min (ref >60)
Glucose, Bld: 119 mg/dL — ABNORMAL HIGH (ref 70–99)
Potassium: 4.2 mmol/L (ref 3.5–5.1)
Sodium: 141 mmol/L (ref 135–145)
Total Bilirubin: 0.2 mg/dL (ref 0.0–1.2)
Total Protein: 7.1 g/dL (ref 6.5–8.1)

## 2024-06-16 MED ORDER — HEPARIN SOD (PORK) LOCK FLUSH 100 UNIT/ML IV SOLN
500.0000 [IU] | Freq: Once | INTRAVENOUS | Status: AC
  Administered 2024-06-16: 500 [IU] via INTRAVENOUS

## 2024-06-16 MED ORDER — SODIUM CHLORIDE 0.9% FLUSH
10.0000 mL | Freq: Once | INTRAVENOUS | Status: AC
  Administered 2024-06-16: 10 mL

## 2024-06-16 NOTE — Telephone Encounter (Signed)
 error

## 2024-06-16 NOTE — Progress Notes (Signed)
 [facility] OFFICE PROGRESS NOTE   Diagnosis: Gastric cancer  INTERVAL HISTORY:   Henry May completed another cycle of oxaliplatin  and zolbetuximab on 06/03/2024.  He feels well.  Good appetite.  Neuropathy symptoms are improved.  No nausea or vomiting.  No complaint.  Good appetite.  Objective:  Vital signs in last 24 hours:  Blood pressure 121/74, pulse 60, temperature 98.2 F (36.8 C), temperature source Temporal, resp. rate 18, height 6' (1.829 m), weight 195 lb (88.5 kg), SpO2 99%.    HEENT: No thrush or ulcers Resp: Lungs clear bilaterally Cardio: Regular rate and rhythm GI: No hepatosplenomegaly, no apparent ascites, nontender Vascular: No leg edema Neuro: Mild loss of vibratory sense at the fingertips bilaterally   Portacath/PICC-without erythema  Lab Results:  Lab Results  Component Value Date   WBC 10.7 (H) 06/16/2024   HGB 13.7 06/16/2024   HCT 41.7 06/16/2024   MCV 99.8 06/16/2024   PLT 115 (L) 06/16/2024   NEUTROABS 8.4 (H) 06/16/2024    CMP  Lab Results  Component Value Date   NA 140 06/03/2024   K 4.5 06/03/2024   CL 104 06/03/2024   CO2 24 06/03/2024   GLUCOSE 98 06/03/2024   BUN 15 06/03/2024   CREATININE 0.87 06/03/2024   CALCIUM  9.4 06/03/2024   PROT 6.9 06/03/2024   ALBUMIN  4.0 06/03/2024   AST 32 06/03/2024   ALT 29 06/03/2024   ALKPHOS 131 (H) 06/03/2024   BILITOT <0.2 06/03/2024   GFRNONAA >60 06/03/2024   GFRAA >60 09/03/2020    Lab Results  Component Value Date   CEA 3.57 01/15/2024      Medications: I have reviewed the patient's current medications.   Assessment/Plan: Gastric cancer 11/21/2023 EGD-gastric body with infiltrative looking lesion; biopsy shows involvement of a hypercellular lesion that suggests diffuse carcinoma by morphology CTs abdomen/pelvis 11/28/2023-diffuse fatty infiltration of the liver; gastric wall thickening; lymph nodes up to 6 mm in diameter near the stomach wall. 12/31/2023  upper endoscopy-large diffuse friable infiltrative polypoid and ulcerated circumferential mass found in the gastric body.  Scope was passed beyond the mass with normal antrum/pylorus.  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  Negative for HER2 (1+); mismatch repair protein IHC normal; PD-L1 CPS 0%; CLDN18 positive: 90% of tumor cells with 2+/3+ membrane staining PET scan 01/07/2024-circumferential hypermetabolic gastric mucosal thickening.  Ill-defined hypermetabolic lymph nodes in the gastrohepatic ligament.  Intense hypermetabolic activity associated with the right lobe of the thyroid  gland.  Small hypermetabolic right axillary node favored reactive. 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  Gastric wall thickening slightly improved. Cycle 7 oxaliplatin , zolbetuximab 05/05/2024, Fulphila  Cycle 8 zolbetuximab 05/20/2024, oxaliplatin  held due to neuropathy, no Fulphila  Cycle 9 oxaliplatin /zolbetuximab 06/03/2024, Fulphila  Cycle 10 oxaliplatin /zolbetuximab 06/17/2024, Fulphila  Early satiety, postprandial abdominal pain, weight loss secondary to #1 History of bilateral lower extremity DVT 2021-anticoagulation discontinued due to massive retroperitoneal bleed, IVC filter placed 08/24/2020 Hospitalized with acute respiratory failure due to COVID-19  08/06/2020 - 09/04/2020 History of massive retroperitoneal bleed secondary to anticoagulation September  2021 Thyroid  ultrasound 01/13/2024-no abnormal nodule identified in the right lobe.  1.1 cm thyroid  isthmus nodule meets criteria for 1 year follow-up ultrasound. Admission 02/01/2024 with increased abdominal pain/ascites Hospitalization with altered mental status 02/05/2024 through 02/13/2024; question rare case of 5-FU psychosis.  Improved 02/16/2024.  Mental status at baseline 02/23/2024. Neutropenia 02/16/2024 Mucositis 02/16/2024.  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  Persistent cold sensitivity 05/20/2024, oxaliplatin  held.  Cold sensitivity resolved 06/03/2024, oxaliplatin  resumed.      Disposition: Henry May appears stable.  He tolerated the last cycle of oxaliplatin /zolbetuximab well.  Neuropathy symptoms have improved.  He will complete a cycle of oxaliplatin /zolbetuximab on 06/17/2024.  He will receive G-CSF following chemotherapy.  Henry May will return for an office visit and chemotherapy in 2 weeks.  We will plan for a restaging CT in August.  Arley Hof, MD  06/16/2024  8:51 AM

## 2024-06-17 ENCOUNTER — Other Ambulatory Visit: Payer: Self-pay

## 2024-06-17 ENCOUNTER — Encounter: Payer: Self-pay | Admitting: Oncology

## 2024-06-17 ENCOUNTER — Inpatient Hospital Stay

## 2024-06-17 ENCOUNTER — Ambulatory Visit

## 2024-06-17 VITALS — BP 112/61 | HR 50 | Temp 98.2°F | Resp 18

## 2024-06-17 DIAGNOSIS — Z5111 Encounter for antineoplastic chemotherapy: Secondary | ICD-10-CM | POA: Diagnosis not present

## 2024-06-17 DIAGNOSIS — C169 Malignant neoplasm of stomach, unspecified: Secondary | ICD-10-CM

## 2024-06-17 MED ORDER — OLANZAPINE 5 MG PO TABS
5.0000 mg | ORAL_TABLET | Freq: Once | ORAL | Status: AC
  Administered 2024-06-17: 5 mg via ORAL
  Filled 2024-06-17: qty 1

## 2024-06-17 MED ORDER — SODIUM CHLORIDE 0.9 % IV SOLN
150.0000 mg | Freq: Once | INTRAVENOUS | Status: AC
  Administered 2024-06-17: 150 mg via INTRAVENOUS
  Filled 2024-06-17: qty 150

## 2024-06-17 MED ORDER — OXALIPLATIN CHEMO INJECTION 100 MG/20ML
85.0000 mg/m2 | Freq: Once | INTRAVENOUS | Status: AC
  Administered 2024-06-17: 175 mg via INTRAVENOUS
  Filled 2024-06-17: qty 35

## 2024-06-17 MED ORDER — PALONOSETRON HCL INJECTION 0.25 MG/5ML
0.2500 mg | Freq: Once | INTRAVENOUS | Status: AC
  Administered 2024-06-17: 0.25 mg via INTRAVENOUS
  Filled 2024-06-17: qty 5

## 2024-06-17 MED ORDER — HEPARIN SOD (PORK) LOCK FLUSH 100 UNIT/ML IV SOLN
500.0000 [IU] | Freq: Once | INTRAVENOUS | Status: AC | PRN
  Administered 2024-06-17: 500 [IU]

## 2024-06-17 MED ORDER — SODIUM CHLORIDE 0.9 % IV SOLN
INTRAVENOUS | Status: DC
Start: 2024-06-17 — End: 2024-06-17

## 2024-06-17 MED ORDER — DEXTROSE 5 % IV SOLN: INTRAVENOUS | Status: DC

## 2024-06-17 MED ORDER — ZOLBETUXIMAB-CLZB CHEMO 100MG/5ML IV SOLN
387.5000 mg/m2 | Freq: Once | INTRAVENOUS | Status: AC
  Administered 2024-06-17: 800 mg via INTRAVENOUS
  Filled 2024-06-17: qty 30

## 2024-06-17 MED ORDER — SODIUM CHLORIDE 0.9% FLUSH
10.0000 mL | INTRAVENOUS | Status: DC | PRN
  Administered 2024-06-17: 10 mL

## 2024-06-17 MED ORDER — DEXAMETHASONE SODIUM PHOSPHATE 10 MG/ML IJ SOLN
10.0000 mg | Freq: Once | INTRAMUSCULAR | Status: AC
  Administered 2024-06-17: 10 mg via INTRAVENOUS
  Filled 2024-06-17: qty 1

## 2024-06-17 NOTE — Patient Instructions (Signed)
 [facility] - A DEPT OF [facility]   Discharge Instructions: Thank you for choosing [facility] to provide your oncology and hematology care.   If you have a lab appointment with the Cancer Center, please go directly to the Cancer Center and check in at the registration area.   Wear comfortable clothing and clothing appropriate for easy access to any Portacath or PICC line.   We strive to give you quality time with your provider. You may need to reschedule your appointment if you arrive late (15 or more minutes).  Arriving late affects you and other patients whose appointments are after yours.  Also, if you miss three or more appointments without notifying the office, you may be dismissed from the clinic at the provider's discretion.      For prescription refill requests, have your pharmacy contact our office and allow 72 hours for refills to be completed.    Today you received the following chemotherapy and/or immunotherapy agents VYLOY  and Oxaliplatin .   To help prevent nausea and vomiting after your treatment, we encourage you to take your nausea medication as directed.  BELOW ARE SYMPTOMS THAT SHOULD BE REPORTED IMMEDIATELY: *FEVER GREATER THAN 100.4 F (38 C) OR HIGHER *CHILLS OR SWEATING *NAUSEA AND VOMITING THAT IS NOT CONTROLLED WITH YOUR NAUSEA MEDICATION *UNUSUAL SHORTNESS OF BREATH *UNUSUAL BRUISING OR BLEEDING *URINARY PROBLEMS (pain or burning when urinating, or frequent urination) *BOWEL PROBLEMS (unusual diarrhea, constipation, pain near the anus) TENDERNESS IN MOUTH AND THROAT WITH OR WITHOUT PRESENCE OF ULCERS (sore throat, sores in mouth, or a toothache) UNUSUAL RASH, SWELLING OR PAIN  UNUSUAL VAGINAL DISCHARGE OR ITCHING   Items with * indicate a potential emergency and should be followed up as soon as possible or go to the Emergency Department if any problems should occur.  Please show the CHEMOTHERAPY ALERT CARD or  IMMUNOTHERAPY ALERT CARD at check-in to the Emergency Department and triage nurse.  Should you have questions after your visit or need to cancel or reschedule your appointment, please contact [facility]  Dept: [phone]  and follow the prompts.  Office hours are 8:00 a.m. to 4:30 p.m. Monday - Friday. Please note that voicemails left after 4:00 p.m. may not be returned until the following business day.  We are closed weekends and major holidays. You have access to a nurse at all times for urgent questions. Please call the main number to the clinic Dept: [phone] and follow the prompts.   For any non-urgent questions, you may also contact your provider using MyChart. We now offer e-Visits for anyone 45 and older to request care online for non-urgent symptoms. For details visit [email or web address].   Also download the MyChart app! Go to the app store, search MyChart, open the app, select [place], and log in with your MyChart username and password.  Oxaliplatin  Injection What is this medication? OXALIPLATIN  (ox AL i PLA tin) treats colorectal cancer. It works by slowing down the growth of cancer cells. This medicine may be used for other purposes; ask your health care provider or pharmacist if you have questions. COMMON BRAND NAME(S): Eloxatin  What should I tell my care team before I take this medication? They need to know if you have any of these conditions: Heart disease History of irregular heartbeat or rhythm Liver disease Low blood cell levels (white cells, red cells, and platelets) Lung or breathing disease, such  as asthma Take medications that treat or prevent blood clots Tingling of the fingers, toes, or other nerve disorder An unusual or allergic reaction to oxaliplatin , other medications, foods, dyes, or preservatives If you or your partner are pregnant or trying to get pregnant Breast-feeding How should I use  this medication? This medication is injected into a vein. It is given by your care team in a hospital or clinic setting. Talk to your care team about the use of this medication in children. Special care may be needed. Overdosage: If you think you have taken too much of this medicine contact a poison control center or emergency room at once. NOTE: This medicine is only for you. Do not share this medicine with others. What if I miss a dose? Keep appointments for follow-up doses. It is important not to miss a dose. Call your care team if you are unable to keep an appointment. What may interact with this medication? Do not take this medication with any of the following: Cisapride Dronedarone Pimozide Thioridazine This medication may also interact with the following: Aspirin and aspirin-like medications Certain medications that treat or prevent blood clots, such as warfarin, apixaban, dabigatran, and rivaroxaban Cisplatin Cyclosporine Diuretics Medications for infection, such as acyclovir, adefovir, amphotericin B, bacitracin, cidofovir, foscarnet, ganciclovir, gentamicin, pentamidine, vancomycin  NSAIDs, medications for pain and inflammation, such as ibuprofen or naproxen Other medications that cause heart rhythm changes Pamidronate Zoledronic acid This list may not describe all possible interactions. Give your health care provider a list of all the medicines, herbs, non-prescription drugs, or dietary supplements you use. Also tell them if you smoke, drink alcohol, or use illegal drugs. Some items may interact with your medicine. What should I watch for while using this medication? Your condition will be monitored carefully while you are receiving this medication. You may need blood work while taking this medication. This medication may make you feel generally unwell. This is not uncommon as chemotherapy can affect healthy cells as well as cancer cells. Report any side effects. Continue your  course of treatment even though you feel ill unless your care team tells you to stop. This medication may increase your risk of getting an infection. Call your care team for advice if you get a fever, chills, sore throat, or other symptoms of a cold or flu. Do not treat yourself. Try to avoid being around people who are sick. Avoid taking medications that contain aspirin, acetaminophen , ibuprofen, naproxen, or ketoprofen unless instructed by your care team. These medications may hide a fever. Be careful brushing or flossing your teeth or using a toothpick because you may get an infection or bleed more easily. If you have any dental work done, tell your dentist you are receiving this medication. This medication can make you more sensitive to cold. Do not drink cold drinks or use ice. Cover exposed skin before coming in contact with cold temperatures or cold objects. When out in cold weather wear warm clothing and cover your mouth and nose to warm the air that goes into your lungs. Tell your care team if you get sensitive to the cold. Talk to your care team if you or your partner are pregnant or think either of you might be pregnant. This medication can cause serious birth defects if taken during pregnancy and for 9 months after the last dose. A negative pregnancy test is required before starting this medication. A reliable form of contraception is recommended while taking this medication and for 9 months after the  last dose. Talk to your care team about effective forms of contraception. Do not father a child while taking this medication and for 6 months after the last dose. Use a condom while having sex during this time period. Do not breastfeed while taking this medication and for 3 months after the last dose. This medication may cause infertility. Talk to your care team if you are concerned about your fertility. What side effects may I notice from receiving this medication? Side effects that you should  report to your care team as soon as possible: Allergic reactions--skin rash, itching, hives, swelling of the face, lips, tongue, or throat Bleeding--bloody or black, tar-like stools, vomiting blood or brown material that looks like coffee grounds, red or dark brown urine, small red or purple spots on skin, unusual bruising or bleeding Dry cough, shortness of breath or trouble breathing Heart rhythm changes--fast or irregular heartbeat, dizziness, feeling faint or lightheaded, chest pain, trouble breathing Infection--fever, chills, cough, sore throat, wounds that don't heal, pain or trouble when passing urine, general feeling of discomfort or being unwell Liver injury--right upper belly pain, loss of appetite, nausea, light-colored stool, dark yellow or brown urine, yellowing skin or eyes, unusual weakness or fatigue Low red blood cell level--unusual weakness or fatigue, dizziness, headache, trouble breathing Muscle injury--unusual weakness or fatigue, muscle pain, dark yellow or brown urine, decrease in amount of urine Pain, tingling, or numbness in the hands or feet Sudden and severe headache, confusion, change in vision, seizures, which may be signs of posterior reversible encephalopathy syndrome (PRES) Unusual bruising or bleeding Side effects that usually do not require medical attention (report to your care team if they continue or are bothersome): Diarrhea Nausea Pain, redness, or swelling with sores inside the mouth or throat Unusual weakness or fatigue Vomiting This list may not describe all possible side effects. Call your doctor for medical advice about side effects. You may report side effects to FDA at 1-800-FDA-1088. Where should I keep my medication? This medication is given in a hospital or clinic. It will not be stored at home. NOTE: This sheet is a summary. It may not cover all possible information. If you have questions about this medicine, talk to your doctor, pharmacist, or  health care provider.  2024 Elsevier/Gold Standard (2023-11-14 00:00:00)

## 2024-06-19 ENCOUNTER — Ambulatory Visit

## 2024-06-19 ENCOUNTER — Inpatient Hospital Stay

## 2024-06-19 VITALS — BP 129/75 | HR 58 | Temp 97.0°F | Resp 13

## 2024-06-19 DIAGNOSIS — Z5111 Encounter for antineoplastic chemotherapy: Secondary | ICD-10-CM | POA: Diagnosis not present

## 2024-06-19 DIAGNOSIS — C169 Malignant neoplasm of stomach, unspecified: Secondary | ICD-10-CM

## 2024-06-19 MED ORDER — PEGFILGRASTIM-JMDB 6 MG/0.6ML ~~LOC~~ SOSY
6.0000 mg | PREFILLED_SYRINGE | Freq: Once | SUBCUTANEOUS | Status: AC
  Administered 2024-06-19: 6 mg via SUBCUTANEOUS
  Filled 2024-06-19: qty 0.6

## 2024-06-23 NOTE — Congregational Nurse Program (Signed)
  Dept: [phone]   Congregational Nurse Program Note  Date of Encounter: 06/09/24 Pt's wife, Henry May, to [facility] community  clinic seeking financial assistance. Due to pt's recent cancer dx, he has been out of work and family does not have enough money to cover rent, bills. Working with [employer] to assist pt until his ss begins in September. Referred pt to [facility]. Henry May going to call pt wife Henry May to discuss her options. Pt also working with [employer] for assistance. Landry Slocumb, RN, CCNP   Past Medical History: Past Medical History:  Diagnosis Date   Acute respiratory failure (HCC) 08/06/2020   Chronic cough    SINCE 08-06-2020 COVID PNEUMONIA   COVID 08/06/2020   Dvt femoral (deep venous thrombosis) (HCC) 08/2020   LEFT LEG    History of blood transfusion 08/2020   AFTER MI 5 UNITS GIVEN   History of kidney stones    Hypertension    Numbness    LEFT LEG AT TIMES   Pneumonia 08/06/2020   COVID PNEUMONIA    Encounter Details:  Community Questionnaire - 06/09/24 1455       Questionnaire   Ask client: Do you give verbal consent for me to treat you today? Yes   Note - this congregational nurse visit was with pt's wife Henry May, pt not present   Student Assistance N/A    Location Patient Served  [facility]    Encounter Setting CN site    Population Status Unknown    Engineer, building services or [employer]    Insurance/Financial Assistance Referral [facility]    Medication Have Medication Insecurities    Medical Provider Yes    Screening Referrals Made N/A    Medical Referrals Made N/A    Medical Appointment Completed N/A    CNP Interventions Advocate/Support;[facility];Spiritual Care    Screenings CN Performed N/A    ED Visit Averted N/A    Life-Saving Intervention Made N/A

## 2024-06-27 ENCOUNTER — Other Ambulatory Visit: Payer: Self-pay | Admitting: Oncology

## 2024-06-28 ENCOUNTER — Other Ambulatory Visit: Payer: Self-pay

## 2024-06-30 ENCOUNTER — Inpatient Hospital Stay

## 2024-06-30 ENCOUNTER — Inpatient Hospital Stay (HOSPITAL_BASED_OUTPATIENT_CLINIC_OR_DEPARTMENT_OTHER): Admitting: Oncology

## 2024-06-30 ENCOUNTER — Encounter: Payer: Self-pay | Admitting: Nurse Practitioner

## 2024-06-30 VITALS — BP 127/81 | HR 50 | Temp 98.2°F | Resp 18

## 2024-06-30 VITALS — BP 109/72 | HR 66 | Temp 98.3°F | Resp 16 | Wt 196.7 lb

## 2024-06-30 DIAGNOSIS — C169 Malignant neoplasm of stomach, unspecified: Secondary | ICD-10-CM

## 2024-06-30 DIAGNOSIS — Z5111 Encounter for antineoplastic chemotherapy: Secondary | ICD-10-CM | POA: Diagnosis not present

## 2024-06-30 LAB — CBC WITH DIFFERENTIAL (CANCER CENTER ONLY)
Abs Immature Granulocytes: 0.04 K/uL (ref 0.00–0.07)
Basophils Absolute: 0 K/uL (ref 0.0–0.1)
Basophils Relative: 1 %
Eosinophils Absolute: 0.4 K/uL (ref 0.0–0.5)
Eosinophils Relative: 5 %
HCT: 41.9 % (ref 39.0–52.0)
Hemoglobin: 13.9 g/dL (ref 13.0–17.0)
Immature Granulocytes: 1 %
Lymphocytes Relative: 19 %
Lymphs Abs: 1.5 K/uL (ref 0.7–4.0)
MCH: 32.9 pg (ref 26.0–34.0)
MCHC: 33.2 g/dL (ref 30.0–36.0)
MCV: 99.1 fL (ref 80.0–100.0)
Monocytes Absolute: 0.5 K/uL (ref 0.1–1.0)
Monocytes Relative: 6 %
Neutro Abs: 5.4 K/uL (ref 1.7–7.7)
Neutrophils Relative %: 68 %
Platelet Count: 110 K/uL — ABNORMAL LOW (ref 150–400)
RBC: 4.23 MIL/uL (ref 4.22–5.81)
RDW: 13.6 % (ref 11.5–15.5)
WBC Count: 7.8 K/uL (ref 4.0–10.5)
nRBC: 0 % (ref 0.0–0.2)

## 2024-06-30 LAB — CMP (CANCER CENTER ONLY)
ALT: 41 U/L (ref 0–44)
AST: 41 U/L (ref 15–41)
Albumin: 4.1 g/dL (ref 3.5–5.0)
Alkaline Phosphatase: 180 U/L — ABNORMAL HIGH (ref 38–126)
Anion gap: 12 (ref 5–15)
BUN: 18 mg/dL (ref 6–20)
CO2: 22 mmol/L (ref 22–32)
Calcium: 9 mg/dL (ref 8.9–10.3)
Chloride: 107 mmol/L (ref 98–111)
Creatinine: 1.02 mg/dL (ref 0.61–1.24)
GFR, Estimated: >60 mL/min (ref >60)
Glucose, Bld: 136 mg/dL — ABNORMAL HIGH (ref 70–99)
Potassium: 4.1 mmol/L (ref 3.5–5.1)
Sodium: 141 mmol/L (ref 135–145)
Total Bilirubin: 0.2 mg/dL (ref 0.0–1.2)
Total Protein: 7.1 g/dL (ref 6.5–8.1)

## 2024-06-30 MED ORDER — SODIUM CHLORIDE 0.9% FLUSH
10.0000 mL | INTRAVENOUS | Status: DC | PRN
  Administered 2024-06-30: 10 mL

## 2024-06-30 MED ORDER — OLANZAPINE 5 MG PO TABS
5.0000 mg | ORAL_TABLET | Freq: Once | ORAL | Status: AC
  Administered 2024-06-30: 5 mg via ORAL
  Filled 2024-06-30: qty 1

## 2024-06-30 MED ORDER — PALONOSETRON HCL INJECTION 0.25 MG/5ML
0.2500 mg | Freq: Once | INTRAVENOUS | Status: AC
  Administered 2024-06-30: 0.25 mg via INTRAVENOUS
  Filled 2024-06-30: qty 5

## 2024-06-30 MED ORDER — DEXAMETHASONE SODIUM PHOSPHATE 10 MG/ML IJ SOLN
10.0000 mg | Freq: Once | INTRAMUSCULAR | Status: AC
  Administered 2024-06-30: 10 mg via INTRAVENOUS
  Filled 2024-06-30: qty 1

## 2024-06-30 MED ORDER — ZOLBETUXIMAB-CLZB CHEMO 100MG/5ML IV SOLN
387.5000 mg/m2 | Freq: Once | INTRAVENOUS | Status: AC
  Administered 2024-06-30: 800 mg via INTRAVENOUS
  Filled 2024-06-30: qty 30

## 2024-06-30 MED ORDER — SODIUM CHLORIDE 0.9 % IV SOLN: INTRAVENOUS | Status: DC

## 2024-06-30 MED ORDER — OXALIPLATIN CHEMO INJECTION 100 MG/20ML
65.0000 mg/m2 | Freq: Once | INTRAVENOUS | Status: AC
  Administered 2024-06-30: 135 mg via INTRAVENOUS
  Filled 2024-06-30: qty 20

## 2024-06-30 MED ORDER — HEPARIN SOD (PORK) LOCK FLUSH 100 UNIT/ML IV SOLN
500.0000 [IU] | Freq: Once | INTRAVENOUS | Status: AC | PRN
Start: 2024-06-30 — End: 2024-06-30
  Administered 2024-06-30: 500 [IU]

## 2024-06-30 MED ORDER — SODIUM CHLORIDE 0.9 % IV SOLN
150.0000 mg | Freq: Once | INTRAVENOUS | Status: AC
  Administered 2024-06-30: 150 mg via INTRAVENOUS
  Filled 2024-06-30: qty 150

## 2024-06-30 MED ORDER — DEXTROSE 5 % IV SOLN: INTRAVENOUS | Status: DC

## 2024-06-30 NOTE — Progress Notes (Signed)
 [facility] OFFICE PROGRESS NOTE   Diagnosis: Gastric cancer  INTERVAL HISTORY:   Mr. Cajamarca completed another cycle of oxaliplatin  and zolbetuximab on 06/17/2024.  No nausea/vomiting, mouth sores, or diarrhea.  He reports tingling in the hands for approximately 4 days following chemotherapy.  He reports the tingling was less severe compared to the previous cycle of chemotherapy.  No peripheral numbness at present.  Objective:  Vital signs in last 24 hours:  Blood pressure 109/72, pulse 66, temperature 98.3 F (36.8 C), temperature source Temporal, resp. rate 16, weight 196 lb 11.2 oz (89.2 kg), SpO2 98%.    HEENT: No thrush or ulcers Resp: Lungs clear bilaterally Cardio: Regular rate and rhythm GI: No apparent ascites, nontender, no hepatosplenomegaly, no mass Vascular: No leg edema Neuro: Mild loss of vibratory sense at the fingertips bilaterally   Portacath/PICC-without erythema  Lab Results:  Lab Results  Component Value Date   WBC 10.7 (H) 06/16/2024   HGB 13.7 06/16/2024   HCT 41.7 06/16/2024   MCV 99.8 06/16/2024   PLT 115 (L) 06/16/2024   NEUTROABS 8.4 (H) 06/16/2024    CMP  Lab Results  Component Value Date   NA 141 06/16/2024   K 4.2 06/16/2024   CL 107 06/16/2024   CO2 24 06/16/2024   GLUCOSE 119 (H) 06/16/2024   BUN 17 06/16/2024   CREATININE 1.01 06/16/2024   CALCIUM  9.5 06/16/2024   PROT 7.1 06/16/2024   ALBUMIN  4.0 06/16/2024   AST 32 06/16/2024   ALT 28 06/16/2024   ALKPHOS 161 (H) 06/16/2024   BILITOT 0.2 06/16/2024   GFRNONAA >60 06/16/2024   GFRAA >60 09/03/2020    Lab Results  Component Value Date   CEA 3.57 01/15/2024    Lab Results  Component Value Date   INR 1.0 01/22/2024   LABPROT 13.7 01/22/2024    Imaging:  No results found.  Medications: I have reviewed the patient's current medications.   Assessment/Plan: Gastric cancer 11/21/2023 EGD-gastric body with infiltrative looking lesion; biopsy shows  involvement of a hypercellular lesion that suggests diffuse carcinoma by morphology CTs abdomen/pelvis 11/28/2023-diffuse fatty infiltration of the liver; gastric wall thickening; lymph nodes up to 6 mm in diameter near the stomach wall. 12/31/2023 upper endoscopy-large diffuse friable infiltrative polypoid and ulcerated circumferential mass found in the gastric body.  Scope was passed beyond the mass with normal antrum/pylorus.  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  Negative for HER2 (1+); mismatch repair protein IHC normal; PD-L1 CPS 0%; CLDN18 positive: 90% of tumor cells with 2+/3+ membrane staining PET scan 01/07/2024-circumferential hypermetabolic gastric mucosal thickening.  Ill-defined hypermetabolic lymph nodes in the gastrohepatic ligament.  Intense hypermetabolic activity associated with the right lobe of the thyroid  gland.  Small hypermetabolic right axillary node favored reactive. 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  Gastric wall thickening slightly improved. Cycle 7 oxaliplatin , zolbetuximab 05/05/2024, Fulphila  Cycle 8 zolbetuximab 05/20/2024, oxaliplatin  held due to neuropathy, no Fulphila  Cycle 9 oxaliplatin /zolbetuximab 06/03/2024, Fulphila  Cycle 10 oxaliplatin /zolbetuximab 06/17/2024, Fulphila  Cycle 11  oxaliplatin /zolbetuximab 06/30/2024, Fulphila ,  oxaliplatin  dose reduced Early satiety, postprandial abdominal pain, weight loss secondary to #1 History of bilateral lower extremity DVT 2021-anticoagulation discontinued due to massive retroperitoneal bleed, IVC filter placed 08/24/2020 Hospitalized with acute respiratory failure due to COVID-19 08/06/2020 - 09/04/2020 History of massive retroperitoneal bleed secondary to anticoagulation September 2021 Thyroid  ultrasound 01/13/2024-no abnormal nodule identified in the right lobe.  1.1 cm thyroid  isthmus nodule meets criteria for 1 year follow-up ultrasound. Admission 02/01/2024 with increased abdominal pain/ascites Hospitalization with altered mental status 02/05/2024 through 02/13/2024; question rare case of 5-FU psychosis.  Improved 02/16/2024.  Mental status at baseline 02/23/2024. Neutropenia 02/16/2024 Mucositis 02/16/2024.  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  Persistent cold sensitivity 05/20/2024, oxaliplatin  held.  Cold sensitivity resolved 06/03/2024, oxaliplatin  resumed.  Mild loss of vibratory sense, oxaliplatin  dose reduced 06/29/2024       Disposition: Mr Henry May appears unchanged.  He has completed 10 cycles of oxaliplatin /zolbetuximab, 9 cycles have contained oxaliplatin .  He has mild oxaliplatin  neuropathy.  There is no evidence of progressive gastric cancer.  He will complete another cycle of oxaliplatin /zolbetuximab today.  Oxaliplatin  will be dose reduced.  He will return for an office visit and treatment in 2 weeks.  We will discontinue oxaliplatin  from the chemotherapy regimen depending on his neuropathy symptoms in 2 weeks.  He will be scheduled for a restaging CT evaluation in 1-2 months.  Arley Hof, MD  06/30/2024  8:56 AM

## 2024-06-30 NOTE — Patient Instructions (Signed)
 [facility] - A DEPT OF [facility]   Discharge Instructions: Thank you for choosing [facility] to provide your oncology and hematology care.   If you have a lab appointment with the Cancer Center, please go directly to the Cancer Center and check in at the registration area.   Wear comfortable clothing and clothing appropriate for easy access to any Portacath or PICC line.   We strive to give you quality time with your provider. You may need to reschedule your appointment if you arrive late (15 or more minutes).  Arriving late affects you and other patients whose appointments are after yours.  Also, if you miss three or more appointments without notifying the office, you may be dismissed from the clinic at the provider's discretion.      For prescription refill requests, have your pharmacy contact our office and allow 72 hours for refills to be completed.    Today you received the following chemotherapy and/or immunotherapy agents VYLOY  and Oxaliplatin .   To help prevent nausea and vomiting after your treatment, we encourage you to take your nausea medication as directed.  BELOW ARE SYMPTOMS THAT SHOULD BE REPORTED IMMEDIATELY: *FEVER GREATER THAN 100.4 F (38 C) OR HIGHER *CHILLS OR SWEATING *NAUSEA AND VOMITING THAT IS NOT CONTROLLED WITH YOUR NAUSEA MEDICATION *UNUSUAL SHORTNESS OF BREATH *UNUSUAL BRUISING OR BLEEDING *URINARY PROBLEMS (pain or burning when urinating, or frequent urination) *BOWEL PROBLEMS (unusual diarrhea, constipation, pain near the anus) TENDERNESS IN MOUTH AND THROAT WITH OR WITHOUT PRESENCE OF ULCERS (sore throat, sores in mouth, or a toothache) UNUSUAL RASH, SWELLING OR PAIN  UNUSUAL VAGINAL DISCHARGE OR ITCHING   Items with * indicate a potential emergency and should be followed up as soon as possible or go to the Emergency Department if any problems should occur.  Please show the CHEMOTHERAPY ALERT CARD or  IMMUNOTHERAPY ALERT CARD at check-in to the Emergency Department and triage nurse.  Should you have questions after your visit or need to cancel or reschedule your appointment, please contact [facility]  Dept: [phone]  and follow the prompts.  Office hours are 8:00 a.m. to 4:30 p.m. Monday - Friday. Please note that voicemails left after 4:00 p.m. may not be returned until the following business day.  We are closed weekends and major holidays. You have access to a nurse at all times for urgent questions. Please call the main number to the clinic Dept: [phone] and follow the prompts.   For any non-urgent questions, you may also contact your provider using MyChart. We now offer e-Visits for anyone 45 and older to request care online for non-urgent symptoms. For details visit [email or web address].   Also download the MyChart app! Go to the app store, search MyChart, open the app, select [place], and log in with your MyChart username and password.  Oxaliplatin  Injection What is this medication? OXALIPLATIN  (ox AL i PLA tin) treats colorectal cancer. It works by slowing down the growth of cancer cells. This medicine may be used for other purposes; ask your health care provider or pharmacist if you have questions. COMMON BRAND NAME(S): Eloxatin  What should I tell my care team before I take this medication? They need to know if you have any of these conditions: Heart disease History of irregular heartbeat or rhythm Liver disease Low blood cell levels (white cells, red cells, and platelets) Lung or breathing disease, such  as asthma Take medications that treat or prevent blood clots Tingling of the fingers, toes, or other nerve disorder An unusual or allergic reaction to oxaliplatin , other medications, foods, dyes, or preservatives If you or your partner are pregnant or trying to get pregnant Breast-feeding How should I use  this medication? This medication is injected into a vein. It is given by your care team in a hospital or clinic setting. Talk to your care team about the use of this medication in children. Special care may be needed. Overdosage: If you think you have taken too much of this medicine contact a poison control center or emergency room at once. NOTE: This medicine is only for you. Do not share this medicine with others. What if I miss a dose? Keep appointments for follow-up doses. It is important not to miss a dose. Call your care team if you are unable to keep an appointment. What may interact with this medication? Do not take this medication with any of the following: Cisapride Dronedarone Pimozide Thioridazine This medication may also interact with the following: Aspirin and aspirin-like medications Certain medications that treat or prevent blood clots, such as warfarin, apixaban, dabigatran, and rivaroxaban Cisplatin Cyclosporine Diuretics Medications for infection, such as acyclovir, adefovir, amphotericin B, bacitracin, cidofovir, foscarnet, ganciclovir, gentamicin, pentamidine, vancomycin  NSAIDs, medications for pain and inflammation, such as ibuprofen or naproxen Other medications that cause heart rhythm changes Pamidronate Zoledronic acid This list may not describe all possible interactions. Give your health care provider a list of all the medicines, herbs, non-prescription drugs, or dietary supplements you use. Also tell them if you smoke, drink alcohol, or use illegal drugs. Some items may interact with your medicine. What should I watch for while using this medication? Your condition will be monitored carefully while you are receiving this medication. You may need blood work while taking this medication. This medication may make you feel generally unwell. This is not uncommon as chemotherapy can affect healthy cells as well as cancer cells. Report any side effects. Continue your  course of treatment even though you feel ill unless your care team tells you to stop. This medication may increase your risk of getting an infection. Call your care team for advice if you get a fever, chills, sore throat, or other symptoms of a cold or flu. Do not treat yourself. Try to avoid being around people who are sick. Avoid taking medications that contain aspirin, acetaminophen , ibuprofen, naproxen, or ketoprofen unless instructed by your care team. These medications may hide a fever. Be careful brushing or flossing your teeth or using a toothpick because you may get an infection or bleed more easily. If you have any dental work done, tell your dentist you are receiving this medication. This medication can make you more sensitive to cold. Do not drink cold drinks or use ice. Cover exposed skin before coming in contact with cold temperatures or cold objects. When out in cold weather wear warm clothing and cover your mouth and nose to warm the air that goes into your lungs. Tell your care team if you get sensitive to the cold. Talk to your care team if you or your partner are pregnant or think either of you might be pregnant. This medication can cause serious birth defects if taken during pregnancy and for 9 months after the last dose. A negative pregnancy test is required before starting this medication. A reliable form of contraception is recommended while taking this medication and for 9 months after the  last dose. Talk to your care team about effective forms of contraception. Do not father a child while taking this medication and for 6 months after the last dose. Use a condom while having sex during this time period. Do not breastfeed while taking this medication and for 3 months after the last dose. This medication may cause infertility. Talk to your care team if you are concerned about your fertility. What side effects may I notice from receiving this medication? Side effects that you should  report to your care team as soon as possible: Allergic reactions--skin rash, itching, hives, swelling of the face, lips, tongue, or throat Bleeding--bloody or black, tar-like stools, vomiting blood or brown material that looks like coffee grounds, red or dark brown urine, small red or purple spots on skin, unusual bruising or bleeding Dry cough, shortness of breath or trouble breathing Heart rhythm changes--fast or irregular heartbeat, dizziness, feeling faint or lightheaded, chest pain, trouble breathing Infection--fever, chills, cough, sore throat, wounds that don't heal, pain or trouble when passing urine, general feeling of discomfort or being unwell Liver injury--right upper belly pain, loss of appetite, nausea, light-colored stool, dark yellow or brown urine, yellowing skin or eyes, unusual weakness or fatigue Low red blood cell level--unusual weakness or fatigue, dizziness, headache, trouble breathing Muscle injury--unusual weakness or fatigue, muscle pain, dark yellow or brown urine, decrease in amount of urine Pain, tingling, or numbness in the hands or feet Sudden and severe headache, confusion, change in vision, seizures, which may be signs of posterior reversible encephalopathy syndrome (PRES) Unusual bruising or bleeding Side effects that usually do not require medical attention (report to your care team if they continue or are bothersome): Diarrhea Nausea Pain, redness, or swelling with sores inside the mouth or throat Unusual weakness or fatigue Vomiting This list may not describe all possible side effects. Call your doctor for medical advice about side effects. You may report side effects to FDA at 1-800-FDA-1088. Where should I keep my medication? This medication is given in a hospital or clinic. It will not be stored at home. NOTE: This sheet is a summary. It may not cover all possible information. If you have questions about this medicine, talk to your doctor, pharmacist, or  health care provider.  2024 Elsevier/Gold Standard (2023-11-14 00:00:00)

## 2024-06-30 NOTE — Progress Notes (Signed)
 Patient seen by Dr. Arley Hof today  Vitals are within treatment parameters:Yes   Labs are within treatment parameters: Yes   Treatment plan has been signed: Yes   Per physician team, Patient is ready for treatment. Please note the following modifications: Oxaliplatin  will be dose reduced

## 2024-07-01 ENCOUNTER — Other Ambulatory Visit: Payer: Self-pay

## 2024-07-02 ENCOUNTER — Telehealth: Payer: Self-pay

## 2024-07-02 ENCOUNTER — Inpatient Hospital Stay

## 2024-07-02 VITALS — BP 126/68 | HR 55 | Temp 98.2°F | Resp 18

## 2024-07-02 DIAGNOSIS — C169 Malignant neoplasm of stomach, unspecified: Secondary | ICD-10-CM

## 2024-07-02 DIAGNOSIS — Z5111 Encounter for antineoplastic chemotherapy: Secondary | ICD-10-CM | POA: Diagnosis not present

## 2024-07-02 MED ORDER — PEGFILGRASTIM-JMDB 6 MG/0.6ML ~~LOC~~ SOSY
6.0000 mg | PREFILLED_SYRINGE | Freq: Once | SUBCUTANEOUS | Status: AC
  Administered 2024-07-02: 6 mg via SUBCUTANEOUS
  Filled 2024-07-02: qty 0.6

## 2024-07-02 NOTE — Patient Instructions (Signed)

## 2024-07-02 NOTE — Telephone Encounter (Signed)
 [facility] CSW Progress Note  Clinical Dietitian from patient's spouse regarding insurance changes. Csw left voicemail requesting patient's spouse send CSW a copy of medicaid card.     Follow Up Plan:  CSW will follow-up with patient by phone     Lizbeth Sprague, LCSW Clinical Social Worker [facility]

## 2024-07-02 NOTE — Progress Notes (Signed)
 [facility] CSW Progress Note  Clinical Child psychotherapist contacted patient by phone to follow-up on questions about insurance . Patient's spouse confirmed no insurance changes at this time. Patient's received medicaid card is for past emergency visit in February 2025. Patient's spouse inquired about application for [facility]. Documents previously submitted by patient / spouse.     Interventions: CSW sent email to billing department to get status on receiving documents.       Follow Up Plan:  CSW will follow up with patient once status is learned.  Lizbeth Sprague, LCSW Clinical Social Worker [facility]

## 2024-07-09 ENCOUNTER — Other Ambulatory Visit: Payer: Self-pay | Admitting: Oncology

## 2024-07-12 ENCOUNTER — Telehealth: Payer: Self-pay

## 2024-07-12 NOTE — Telephone Encounter (Signed)
 [facility] CSW Progress Note  Clinical Social Worker attempted to reach patient to follow up on the [employer] Program. Billing department has informed me that patient is approved for financial assistance from 6/26/ 2025- 12/26 2025. CSW informed patient there are three accounts pending assistance due to an appeal process. CSW is waiting to here if the financial assistance will be applied to bills issued before 6/26.   Lizbeth Sprague, LCSW Clinical Social Worker [facility]

## 2024-07-13 ENCOUNTER — Telehealth: Payer: Self-pay

## 2024-07-13 NOTE — Telephone Encounter (Signed)
 Confirmed Thursday 08/15/2024 appointment start time for 0800 with patient's spouse.

## 2024-07-15 ENCOUNTER — Inpatient Hospital Stay

## 2024-07-15 ENCOUNTER — Inpatient Hospital Stay (HOSPITAL_BASED_OUTPATIENT_CLINIC_OR_DEPARTMENT_OTHER): Admitting: Oncology

## 2024-07-15 VITALS — BP 125/62 | HR 65 | Temp 98.1°F | Resp 18

## 2024-07-15 VITALS — BP 113/69 | HR 60 | Temp 98.0°F | Resp 18 | Ht 72.0 in | Wt 199.7 lb

## 2024-07-15 DIAGNOSIS — C169 Malignant neoplasm of stomach, unspecified: Secondary | ICD-10-CM

## 2024-07-15 DIAGNOSIS — Z5111 Encounter for antineoplastic chemotherapy: Secondary | ICD-10-CM | POA: Diagnosis not present

## 2024-07-15 LAB — CMP (CANCER CENTER ONLY)
ALT: 42 U/L (ref 0–44)
AST: 35 U/L (ref 15–41)
Albumin: 4.1 g/dL (ref 3.5–5.0)
Alkaline Phosphatase: 175 U/L — ABNORMAL HIGH (ref 38–126)
Anion gap: 5 (ref 5–15)
BUN: 19 mg/dL (ref 6–20)
CO2: 30 mmol/L (ref 22–32)
Calcium: 9.4 mg/dL (ref 8.9–10.3)
Chloride: 105 mmol/L (ref 98–111)
Creatinine: 0.97 mg/dL (ref 0.61–1.24)
GFR, Estimated: >60 mL/min (ref >60)
Glucose, Bld: 131 mg/dL — ABNORMAL HIGH (ref 70–99)
Potassium: 4.2 mmol/L (ref 3.5–5.1)
Sodium: 140 mmol/L (ref 135–145)
Total Bilirubin: 0.2 mg/dL (ref 0.0–1.2)
Total Protein: 7.2 g/dL (ref 6.5–8.1)

## 2024-07-15 LAB — CBC WITH DIFFERENTIAL (CANCER CENTER ONLY)
Abs Immature Granulocytes: 0.02 K/uL (ref 0.00–0.07)
Basophils Absolute: 0 K/uL (ref 0.0–0.1)
Basophils Relative: 1 %
Eosinophils Absolute: 0.3 K/uL (ref 0.0–0.5)
Eosinophils Relative: 4 %
HCT: 41.1 % (ref 39.0–52.0)
Hemoglobin: 13.8 g/dL (ref 13.0–17.0)
Immature Granulocytes: 0 %
Lymphocytes Relative: 17 %
Lymphs Abs: 1.3 K/uL (ref 0.7–4.0)
MCH: 33.3 pg (ref 26.0–34.0)
MCHC: 33.6 g/dL (ref 30.0–36.0)
MCV: 99 fL (ref 80.0–100.0)
Monocytes Absolute: 0.5 K/uL (ref 0.1–1.0)
Monocytes Relative: 7 %
Neutro Abs: 5.4 K/uL (ref 1.7–7.7)
Neutrophils Relative %: 71 %
Platelet Count: 135 K/uL — ABNORMAL LOW (ref 150–400)
RBC: 4.15 MIL/uL — ABNORMAL LOW (ref 4.22–5.81)
RDW: 13.3 % (ref 11.5–15.5)
WBC Count: 7.6 K/uL (ref 4.0–10.5)
nRBC: 0 % (ref 0.0–0.2)

## 2024-07-15 MED ORDER — SODIUM CHLORIDE 0.9 % IV SOLN: INTRAVENOUS | Status: DC

## 2024-07-15 MED ORDER — SODIUM CHLORIDE 0.9% FLUSH
10.0000 mL | INTRAVENOUS | Status: DC | PRN
  Administered 2024-07-15: 10 mL

## 2024-07-15 MED ORDER — HEPARIN SOD (PORK) LOCK FLUSH 100 UNIT/ML IV SOLN
500.0000 [IU] | Freq: Once | INTRAVENOUS | Status: AC | PRN
Start: 2024-07-15 — End: 2024-07-15
  Administered 2024-07-15: 500 [IU]

## 2024-07-15 MED ORDER — OLANZAPINE 5 MG PO TABS
5.0000 mg | ORAL_TABLET | Freq: Once | ORAL | Status: AC
  Administered 2024-07-15: 5 mg via ORAL
  Filled 2024-07-15: qty 1

## 2024-07-15 MED ORDER — ZOLBETUXIMAB-CLZB CHEMO 100MG/5ML IV SOLN
387.5000 mg/m2 | Freq: Once | INTRAVENOUS | Status: AC
  Administered 2024-07-15: 800 mg via INTRAVENOUS
  Filled 2024-07-15: qty 30

## 2024-07-15 MED ORDER — DEXAMETHASONE SODIUM PHOSPHATE 10 MG/ML IJ SOLN
10.0000 mg | Freq: Once | INTRAMUSCULAR | Status: AC
  Administered 2024-07-15: 10 mg via INTRAVENOUS
  Filled 2024-07-15: qty 1

## 2024-07-15 MED ORDER — PALONOSETRON HCL INJECTION 0.25 MG/5ML
0.2500 mg | Freq: Once | INTRAVENOUS | Status: AC
  Administered 2024-07-15: 0.25 mg via INTRAVENOUS
  Filled 2024-07-15: qty 5

## 2024-07-15 MED ORDER — SODIUM CHLORIDE 0.9 % IV SOLN
150.0000 mg | Freq: Once | INTRAVENOUS | Status: AC
  Administered 2024-07-15: 150 mg via INTRAVENOUS
  Filled 2024-07-15: qty 150

## 2024-07-15 NOTE — Progress Notes (Signed)
 [facility] OFFICE PROGRESS NOTE   Diagnosis: Gastric cancer  INTERVAL HISTORY:   [name] completed another cycle of oxaliplatin /zolbetuximab on 06/30/2024.  He reports cold sensitivity for 4 days following chemotherapy.  No nausea/vomiting.  He feels well.  He is exercising daily.  He reports numbness and discomfort in the toes and dorsum of the feet.  No hand numbness.  Objective:  Vital signs in last 24 hours:  Blood pressure 113/69, pulse 60, temperature 98 F (36.7 C), temperature source Temporal, resp. rate 18, height 6' (1.829 m), weight 199 lb 11.2 oz (90.6 kg), SpO2 99%.    HEENT: No thrush or ulcers Resp: Lungs clear bilaterally Cardio: Regular rate and rhythm GI: No hepatosplenomegaly, no apparent ascites Vascular: No leg edema Neuro: Mild loss of vibratory sense at the fingertips bilaterally Skin: Callus formation at the soles of, no erythema or skin breakdown  Portacath/PICC-without erythema  Lab Results:  Lab Results  Component Value Date   WBC 7.8 06/30/2024   HGB 13.9 06/30/2024   HCT 41.9 06/30/2024   MCV 99.1 06/30/2024   PLT 110 (L) 06/30/2024   NEUTROABS 5.4 06/30/2024    CMP  Lab Results  Component Value Date   NA 141 06/30/2024   K 4.1 06/30/2024   CL 107 06/30/2024   CO2 22 06/30/2024   GLUCOSE 136 (H) 06/30/2024   BUN 18 06/30/2024   CREATININE 1.02 06/30/2024   CALCIUM  9.0 06/30/2024   PROT 7.1 06/30/2024   ALBUMIN  4.1 06/30/2024   AST 41 06/30/2024   ALT 41 06/30/2024   ALKPHOS 180 (H) 06/30/2024   BILITOT 0.2 06/30/2024   GFRNONAA >60 06/30/2024   GFRAA >60 09/03/2020    Lab Results  Component Value Date   CEA 3.57 01/15/2024    Lab Results  Component Value Date   INR 1.0 01/22/2024   LABPROT 13.7 01/22/2024    Imaging:  No results found.  Medications: I have reviewed the patient's current medications.   Assessment/Plan: Gastric cancer 11/21/2023 EGD-gastric body with infiltrative looking  lesion; biopsy shows involvement of a hypercellular lesion that suggests diffuse carcinoma by morphology CTs abdomen/pelvis 11/28/2023-diffuse fatty infiltration of the liver; gastric wall thickening; lymph nodes up to 6 mm in diameter near the stomach wall. 12/31/2023 upper endoscopy-large diffuse friable infiltrative polypoid and ulcerated circumferential mass found in the gastric body.  Scope was passed beyond the mass with normal antrum/pylorus.  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  Negative for HER2 (1+); mismatch repair protein IHC normal; PD-L1 CPS 0%; CLDN18 positive: 90% of tumor cells with 2+/3+ membrane staining PET scan 01/07/2024-circumferential hypermetabolic gastric mucosal thickening.  Ill-defined hypermetabolic lymph nodes in the gastrohepatic ligament.  Intense hypermetabolic activity associated with the right lobe of the thyroid  gland.  Small hypermetabolic right axillary node favored reactive. 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  Gastric wall thickening slightly improved. Cycle 7 oxaliplatin , zolbetuximab 05/05/2024, Fulphila  Cycle 8 zolbetuximab 05/20/2024, oxaliplatin  held due to neuropathy, no Fulphila  Cycle 9 oxaliplatin /zolbetuximab 06/03/2024, Fulphila  Cycle 10 oxaliplatin /zolbetuximab 06/17/2024,  Fulphila  Cycle 11 oxaliplatin /zolbetuximab 06/30/2024, Fulphila , oxaliplatin  dose reduced Cycle 12 zolbetuximab 07/15/2024, oxaliplatin  held secondary to neuropathy Early satiety, postprandial abdominal pain, weight loss secondary to #1 History of bilateral lower extremity DVT 2021-anticoagulation discontinued due to massive retroperitoneal bleed, IVC filter placed 08/24/2020 Hospitalized with acute respiratory failure due to COVID-19 08/06/2020 - 09/04/2020 History of massive retroperitoneal bleed secondary to anticoagulation September 2021 Thyroid  ultrasound 01/13/2024-no abnormal nodule identified in the right lobe.  1.1 cm thyroid  isthmus nodule meets criteria for 1 year follow-up ultrasound. Admission 02/01/2024 with increased abdominal pain/ascites Hospitalization with altered mental status 02/05/2024 through 02/13/2024; question rare case of 5-FU psychosis.  Improved 02/16/2024.  Mental status at baseline 02/23/2024. Neutropenia 02/16/2024 Mucositis 02/16/2024.  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  Persistent cold sensitivity 05/20/2024, oxaliplatin  held.  Cold sensitivity resolved 06/03/2024, oxaliplatin  resumed.  Mild loss of vibratory sense, oxaliplatin  dose reduced 06/29/2024       Disposition: Mr May appears well.  There is no clinical evidence for progression of gastric cancer.  He has developed symptoms of oxaliplatin  neuropathy in the feet.  He has completed 11 cycles of oxaliplatin .  Oxaliplatin  will be placed on hold.  He will continue treatment today with single agent zolbetuximab.  He will continue the current antiemetic regimen.  G-CSF will be held after this cycle.  Henry May will return for an office visit and chemotherapy in 2 weeks.  He will be scheduled for a restaging CT evaluation in September.  Henry Hof, MD  07/15/2024  8:52 AM

## 2024-07-15 NOTE — Progress Notes (Signed)
 Patient seen by Dr. Arley Hof today  Vitals are within treatment parameters:Yes   Labs are within treatment parameters: Yes   Treatment plan has been signed: Yes   Per physician team, Patient is ready for treatment. Please note the following modifications: Discontiued oxaliplatin  and neulasta 

## 2024-07-15 NOTE — Patient Instructions (Signed)
 [facility] - A DEPT OF [facility]   Discharge Instructions: Thank you for choosing [facility] to provide your oncology and hematology care.   If you have a lab appointment with the Cancer Center, please go directly to the Cancer Center and check in at the registration area.   Wear comfortable clothing and clothing appropriate for easy access to any Portacath or PICC line.   We strive to give you quality time with your provider. You may need to reschedule your appointment if you arrive late (15 or more minutes).  Arriving late affects you and other patients whose appointments are after yours.  Also, if you miss three or more appointments without notifying the office, you may be dismissed from the clinic at the provider's discretion.      For prescription refill requests, have your pharmacy contact our office and allow 72 hours for refills to be completed.    Today you received the following chemotherapy and/or immunotherapy agents VYLOY     To help prevent nausea and vomiting after your treatment, we encourage you to take your nausea medication as directed.  BELOW ARE SYMPTOMS THAT SHOULD BE REPORTED IMMEDIATELY: *FEVER GREATER THAN 100.4 F (38 C) OR HIGHER *CHILLS OR SWEATING *NAUSEA AND VOMITING THAT IS NOT CONTROLLED WITH YOUR NAUSEA MEDICATION *UNUSUAL SHORTNESS OF BREATH *UNUSUAL BRUISING OR BLEEDING *URINARY PROBLEMS (pain or burning when urinating, or frequent urination) *BOWEL PROBLEMS (unusual diarrhea, constipation, pain near the anus) TENDERNESS IN MOUTH AND THROAT WITH OR WITHOUT PRESENCE OF ULCERS (sore throat, sores in mouth, or a toothache) UNUSUAL RASH, SWELLING OR PAIN  UNUSUAL VAGINAL DISCHARGE OR ITCHING   Items with * indicate a potential emergency and should be followed up as soon as possible or go to the Emergency Department if any problems should occur.  Please show the CHEMOTHERAPY ALERT CARD or IMMUNOTHERAPY  ALERT CARD at check-in to the Emergency Department and triage nurse.  Should you have questions after your visit or need to cancel or reschedule your appointment, please contact [facility]  Dept: [phone]  and follow the prompts.  Office hours are 8:00 a.m. to 4:30 p.m. Monday - Friday. Please note that voicemails left after 4:00 p.m. may not be returned until the following business day.  We are closed weekends and major holidays. You have access to a nurse at all times for urgent questions. Please call the main number to the clinic Dept: [phone] and follow the prompts.   For any non-urgent questions, you may also contact your provider using MyChart. We now offer e-Visits for anyone 96 and older to request care online for non-urgent symptoms. For details visit [email or web address].   Also download the MyChart app! Go to the app store, search "MyChart", open the app, select [place], and log in with your MyChart username and password.  Oxaliplatin  Injection What is this medication? OXALIPLATIN  (ox AL i PLA tin) treats colorectal cancer. It works by slowing down the growth of cancer cells. This medicine may be used for other purposes; ask your health care provider or pharmacist if you have questions. COMMON BRAND NAME(S): Eloxatin  What should I tell my care team before I take this medication? They need to know if you have any of these conditions: Heart disease History of irregular heartbeat or rhythm Liver disease Low blood cell levels (white cells, red cells, and platelets) Lung or breathing disease, such  as asthma Take medications that treat or prevent blood clots Tingling of the fingers, toes, or other nerve disorder An unusual or allergic reaction to oxaliplatin , other medications, foods, dyes, or preservatives If you or your partner are pregnant or trying to get pregnant Breast-feeding How should I use this  medication? This medication is injected into a vein. It is given by your care team in a hospital or clinic setting. Talk to your care team about the use of this medication in children. Special care may be needed. Overdosage: If you think you have taken too much of this medicine contact a poison control center or emergency room at once. NOTE: This medicine is only for you. Do not share this medicine with others. What if I miss a dose? Keep appointments for follow-up doses. It is important not to miss a dose. Call your care team if you are unable to keep an appointment. What may interact with this medication? Do not take this medication with any of the following: Cisapride Dronedarone Pimozide Thioridazine This medication may also interact with the following: Aspirin and aspirin-like medications Certain medications that treat or prevent blood clots, such as warfarin, apixaban, dabigatran, and rivaroxaban Cisplatin Cyclosporine Diuretics Medications for infection, such as acyclovir, adefovir, amphotericin B, bacitracin, cidofovir, foscarnet, ganciclovir, gentamicin, pentamidine, vancomycin  NSAIDs, medications for pain and inflammation, such as ibuprofen or naproxen Other medications that cause heart rhythm changes Pamidronate Zoledronic acid This list may not describe all possible interactions. Give your health care provider a list of all the medicines, herbs, non-prescription drugs, or dietary supplements you use. Also tell them if you smoke, drink alcohol, or use illegal drugs. Some items may interact with your medicine. What should I watch for while using this medication? Your condition will be monitored carefully while you are receiving this medication. You may need blood work while taking this medication. This medication may make you feel generally unwell. This is not uncommon as chemotherapy can affect healthy cells as well as cancer cells. Report any side effects. Continue your course  of treatment even though you feel ill unless your care team tells you to stop. This medication may increase your risk of getting an infection. Call your care team for advice if you get a fever, chills, sore throat, or other symptoms of a cold or flu. Do not treat yourself. Try to avoid being around people who are sick. Avoid taking medications that contain aspirin, acetaminophen , ibuprofen, naproxen, or ketoprofen unless instructed by your care team. These medications may hide a fever. Be careful brushing or flossing your teeth or using a toothpick because you may get an infection or bleed more easily. If you have any dental work done, tell your dentist you are receiving this medication. This medication can make you more sensitive to cold. Do not drink cold drinks or use ice. Cover exposed skin before coming in contact with cold temperatures or cold objects. When out in cold weather wear warm clothing and cover your mouth and nose to warm the air that goes into your lungs. Tell your care team if you get sensitive to the cold. Talk to your care team if you or your partner are pregnant or think either of you might be pregnant. This medication can cause serious birth defects if taken during pregnancy and for 9 months after the last dose. A negative pregnancy test is required before starting this medication. A reliable form of contraception is recommended while taking this medication and for 9 months after the  last dose. Talk to your care team about effective forms of contraception. Do not father a child while taking this medication and for 6 months after the last dose. Use a condom while having sex during this time period. Do not breastfeed while taking this medication and for 3 months after the last dose. This medication may cause infertility. Talk to your care team if you are concerned about your fertility. What side effects may I notice from receiving this medication? Side effects that you should report to  your care team as soon as possible: Allergic reactions--skin rash, itching, hives, swelling of the face, lips, tongue, or throat Bleeding--bloody or black, tar-like stools, vomiting blood or brown material that looks like coffee grounds, red or dark brown urine, small red or purple spots on skin, unusual bruising or bleeding Dry cough, shortness of breath or trouble breathing Heart rhythm changes--fast or irregular heartbeat, dizziness, feeling faint or lightheaded, chest pain, trouble breathing Infection--fever, chills, cough, sore throat, wounds that don't heal, pain or trouble when passing urine, general feeling of discomfort or being unwell Liver injury--right upper belly pain, loss of appetite, nausea, light-colored stool, dark yellow or brown urine, yellowing skin or eyes, unusual weakness or fatigue Low red blood cell level--unusual weakness or fatigue, dizziness, headache, trouble breathing Muscle injury--unusual weakness or fatigue, muscle pain, dark yellow or brown urine, decrease in amount of urine Pain, tingling, or numbness in the hands or feet Sudden and severe headache, confusion, change in vision, seizures, which may be signs of posterior reversible encephalopathy syndrome (PRES) Unusual bruising or bleeding Side effects that usually do not require medical attention (report to your care team if they continue or are bothersome): Diarrhea Nausea Pain, redness, or swelling with sores inside the mouth or throat Unusual weakness or fatigue Vomiting This list may not describe all possible side effects. Call your doctor for medical advice about side effects. You may report side effects to FDA at 1-800-FDA-1088. Where should I keep my medication? This medication is given in a hospital or clinic. It will not be stored at home. NOTE: This sheet is a summary. It may not cover all possible information. If you have questions about this medicine, talk to your doctor, pharmacist, or health care  provider.  2024 Elsevier/Gold Standard (2023-11-14 00:00:00)

## 2024-07-16 ENCOUNTER — Other Ambulatory Visit: Payer: Self-pay

## 2024-07-17 ENCOUNTER — Inpatient Hospital Stay: Attending: Nurse Practitioner

## 2024-07-17 ENCOUNTER — Other Ambulatory Visit: Payer: Self-pay

## 2024-07-21 ENCOUNTER — Other Ambulatory Visit: Payer: Self-pay | Admitting: *Deleted

## 2024-07-21 DIAGNOSIS — C162 Malignant neoplasm of body of stomach: Secondary | ICD-10-CM

## 2024-07-21 MED ORDER — OLANZAPINE 5 MG PO TABS: 5.0000 mg | ORAL_TABLET | ORAL | 2 refills | Status: DC

## 2024-07-22 ENCOUNTER — Other Ambulatory Visit: Payer: Self-pay | Admitting: Oncology

## 2024-07-29 ENCOUNTER — Inpatient Hospital Stay (HOSPITAL_BASED_OUTPATIENT_CLINIC_OR_DEPARTMENT_OTHER): Admitting: Oncology

## 2024-07-29 ENCOUNTER — Inpatient Hospital Stay: Attending: Nurse Practitioner

## 2024-07-29 ENCOUNTER — Inpatient Hospital Stay

## 2024-07-29 VITALS — BP 115/61 | HR 53 | Temp 98.6°F | Resp 18

## 2024-07-29 VITALS — BP 110/73 | HR 60 | Temp 98.3°F | Resp 18 | Ht 72.0 in | Wt 202.6 lb

## 2024-07-29 DIAGNOSIS — T451X5A Adverse effect of antineoplastic and immunosuppressive drugs, initial encounter: Secondary | ICD-10-CM | POA: Insufficient documentation

## 2024-07-29 DIAGNOSIS — C169 Malignant neoplasm of stomach, unspecified: Secondary | ICD-10-CM | POA: Diagnosis not present

## 2024-07-29 DIAGNOSIS — C786 Secondary malignant neoplasm of retroperitoneum and peritoneum: Secondary | ICD-10-CM | POA: Diagnosis present

## 2024-07-29 DIAGNOSIS — C162 Malignant neoplasm of body of stomach: Secondary | ICD-10-CM | POA: Diagnosis present

## 2024-07-29 DIAGNOSIS — G620 Drug-induced polyneuropathy: Secondary | ICD-10-CM | POA: Insufficient documentation

## 2024-07-29 DIAGNOSIS — Z5111 Encounter for antineoplastic chemotherapy: Secondary | ICD-10-CM | POA: Diagnosis present

## 2024-07-29 LAB — CMP (CANCER CENTER ONLY)
ALT: 35 U/L (ref 0–44)
AST: 32 U/L (ref 15–41)
Albumin: 4 g/dL (ref 3.5–5.0)
Alkaline Phosphatase: 134 U/L — ABNORMAL HIGH (ref 38–126)
Anion gap: 12 (ref 5–15)
BUN: 15 mg/dL (ref 6–20)
CO2: 23 mmol/L (ref 22–32)
Calcium: 9.4 mg/dL (ref 8.9–10.3)
Chloride: 106 mmol/L (ref 98–111)
Creatinine: 0.96 mg/dL (ref 0.61–1.24)
GFR, Estimated: >60 mL/min (ref >60)
Glucose, Bld: 117 mg/dL — ABNORMAL HIGH (ref 70–99)
Potassium: 4 mmol/L (ref 3.5–5.1)
Sodium: 141 mmol/L (ref 135–145)
Total Bilirubin: 0.3 mg/dL (ref 0.0–1.2)
Total Protein: 7 g/dL (ref 6.5–8.1)

## 2024-07-29 LAB — CBC WITH DIFFERENTIAL (CANCER CENTER ONLY)
Abs Immature Granulocytes: 0.02 K/uL (ref 0.00–0.07)
Basophils Absolute: 0 K/uL (ref 0.0–0.1)
Basophils Relative: 1 %
Eosinophils Absolute: 0.4 K/uL (ref 0.0–0.5)
Eosinophils Relative: 6 %
HCT: 41.2 % (ref 39.0–52.0)
Hemoglobin: 13.8 g/dL (ref 13.0–17.0)
Immature Granulocytes: 0 %
Lymphocytes Relative: 25 %
Lymphs Abs: 1.5 K/uL (ref 0.7–4.0)
MCH: 32.6 pg (ref 26.0–34.0)
MCHC: 33.5 g/dL (ref 30.0–36.0)
MCV: 97.4 fL (ref 80.0–100.0)
Monocytes Absolute: 0.6 K/uL (ref 0.1–1.0)
Monocytes Relative: 11 %
Neutro Abs: 3.4 K/uL (ref 1.7–7.7)
Neutrophils Relative %: 57 %
Platelet Count: 174 K/uL (ref 150–400)
RBC: 4.23 MIL/uL (ref 4.22–5.81)
RDW: 13 % (ref 11.5–15.5)
WBC Count: 5.9 K/uL (ref 4.0–10.5)
nRBC: 0 % (ref 0.0–0.2)

## 2024-07-29 MED ORDER — PALONOSETRON HCL INJECTION 0.25 MG/5ML
0.2500 mg | Freq: Once | INTRAVENOUS | Status: AC
  Administered 2024-07-29: 0.25 mg via INTRAVENOUS
  Filled 2024-07-29: qty 5

## 2024-07-29 MED ORDER — SODIUM CHLORIDE 0.9 % IV SOLN
150.0000 mg | Freq: Once | INTRAVENOUS | Status: AC
  Administered 2024-07-29: 150 mg via INTRAVENOUS
  Filled 2024-07-29: qty 150

## 2024-07-29 MED ORDER — SODIUM CHLORIDE 0.9 % IV SOLN
INTRAVENOUS | Status: DC
Start: 2024-07-29 — End: 2024-07-29

## 2024-07-29 MED ORDER — ZOLBETUXIMAB-CLZB CHEMO 100MG/5ML IV SOLN
387.5000 mg/m2 | Freq: Once | INTRAVENOUS | Status: AC
  Administered 2024-07-29: 800 mg via INTRAVENOUS
  Filled 2024-07-29: qty 40

## 2024-07-29 MED ORDER — DEXAMETHASONE SODIUM PHOSPHATE 10 MG/ML IJ SOLN
10.0000 mg | Freq: Once | INTRAMUSCULAR | Status: AC
  Administered 2024-07-29: 10 mg via INTRAVENOUS
  Filled 2024-07-29: qty 1

## 2024-07-29 MED ORDER — OLANZAPINE 5 MG PO TABS
5.0000 mg | ORAL_TABLET | Freq: Once | ORAL | Status: AC
  Administered 2024-07-29: 5 mg via ORAL
  Filled 2024-07-29: qty 1

## 2024-07-29 NOTE — Progress Notes (Signed)
 Patient seen by Dr. Arley Hof today  Vitals are within treatment parameters:Yes   Labs are within treatment parameters: Yes   Treatment plan has been signed: Yes   Per physician team, Patient is ready for treatment. Please note the following modifications: Oxaliplatin  and Udenyca  removed from careplan

## 2024-07-29 NOTE — Patient Instructions (Signed)
 [facility] - A DEPT OF [facility]   Discharge Instructions: Thank you for choosing [facility] to provide your oncology and hematology care.   If you have a lab appointment with the Cancer Center, please go directly to the Cancer Center and check in at the registration area.   Wear comfortable clothing and clothing appropriate for easy access to any Portacath or PICC line.   We strive to give you quality time with your provider. You may need to reschedule your appointment if you arrive late (15 or more minutes).  Arriving late affects you and other patients whose appointments are after yours.  Also, if you miss three or more appointments without notifying the office, you may be dismissed from the clinic at the provider's discretion.      For prescription refill requests, have your pharmacy contact our office and allow 72 hours for refills to be completed.    Today you received the following chemotherapy and/or immunotherapy agents VYLOY     To help prevent nausea and vomiting after your treatment, we encourage you to take your nausea medication as directed.  BELOW ARE SYMPTOMS THAT SHOULD BE REPORTED IMMEDIATELY: *FEVER GREATER THAN 100.4 F (38 C) OR HIGHER *CHILLS OR SWEATING *NAUSEA AND VOMITING THAT IS NOT CONTROLLED WITH YOUR NAUSEA MEDICATION *UNUSUAL SHORTNESS OF BREATH *UNUSUAL BRUISING OR BLEEDING *URINARY PROBLEMS (pain or burning when urinating, or frequent urination) *BOWEL PROBLEMS (unusual diarrhea, constipation, pain near the anus) TENDERNESS IN MOUTH AND THROAT WITH OR WITHOUT PRESENCE OF ULCERS (sore throat, sores in mouth, or a toothache) UNUSUAL RASH, SWELLING OR PAIN  UNUSUAL VAGINAL DISCHARGE OR ITCHING   Items with * indicate a potential emergency and should be followed up as soon as possible or go to the Emergency Department if any problems should occur.  Please show the CHEMOTHERAPY ALERT CARD or IMMUNOTHERAPY  ALERT CARD at check-in to the Emergency Department and triage nurse.  Should you have questions after your visit or need to cancel or reschedule your appointment, please contact [facility]  Dept: [phone]  and follow the prompts.  Office hours are 8:00 a.m. to 4:30 p.m. Monday - Friday. Please note that voicemails left after 4:00 p.m. may not be returned until the following business day.  We are closed weekends and major holidays. You have access to a nurse at all times for urgent questions. Please call the main number to the clinic Dept: [phone] and follow the prompts.   For any non-urgent questions, you may also contact your provider using MyChart. We now offer e-Visits for anyone 96 and older to request care online for non-urgent symptoms. For details visit [email or web address].   Also download the MyChart app! Go to the app store, search "MyChart", open the app, select [place], and log in with your MyChart username and password.  Oxaliplatin  Injection What is this medication? OXALIPLATIN  (ox AL i PLA tin) treats colorectal cancer. It works by slowing down the growth of cancer cells. This medicine may be used for other purposes; ask your health care provider or pharmacist if you have questions. COMMON BRAND NAME(S): Eloxatin  What should I tell my care team before I take this medication? They need to know if you have any of these conditions: Heart disease History of irregular heartbeat or rhythm Liver disease Low blood cell levels (white cells, red cells, and platelets) Lung or breathing disease, such  as asthma Take medications that treat or prevent blood clots Tingling of the fingers, toes, or other nerve disorder An unusual or allergic reaction to oxaliplatin , other medications, foods, dyes, or preservatives If you or your partner are pregnant or trying to get pregnant Breast-feeding How should I use this  medication? This medication is injected into a vein. It is given by your care team in a hospital or clinic setting. Talk to your care team about the use of this medication in children. Special care may be needed. Overdosage: If you think you have taken too much of this medicine contact a poison control center or emergency room at once. NOTE: This medicine is only for you. Do not share this medicine with others. What if I miss a dose? Keep appointments for follow-up doses. It is important not to miss a dose. Call your care team if you are unable to keep an appointment. What may interact with this medication? Do not take this medication with any of the following: Cisapride Dronedarone Pimozide Thioridazine This medication may also interact with the following: Aspirin and aspirin-like medications Certain medications that treat or prevent blood clots, such as warfarin, apixaban, dabigatran, and rivaroxaban Cisplatin Cyclosporine Diuretics Medications for infection, such as acyclovir, adefovir, amphotericin B, bacitracin, cidofovir, foscarnet, ganciclovir, gentamicin, pentamidine, vancomycin  NSAIDs, medications for pain and inflammation, such as ibuprofen or naproxen Other medications that cause heart rhythm changes Pamidronate Zoledronic acid This list may not describe all possible interactions. Give your health care provider a list of all the medicines, herbs, non-prescription drugs, or dietary supplements you use. Also tell them if you smoke, drink alcohol, or use illegal drugs. Some items may interact with your medicine. What should I watch for while using this medication? Your condition will be monitored carefully while you are receiving this medication. You may need blood work while taking this medication. This medication may make you feel generally unwell. This is not uncommon as chemotherapy can affect healthy cells as well as cancer cells. Report any side effects. Continue your course  of treatment even though you feel ill unless your care team tells you to stop. This medication may increase your risk of getting an infection. Call your care team for advice if you get a fever, chills, sore throat, or other symptoms of a cold or flu. Do not treat yourself. Try to avoid being around people who are sick. Avoid taking medications that contain aspirin, acetaminophen , ibuprofen, naproxen, or ketoprofen unless instructed by your care team. These medications may hide a fever. Be careful brushing or flossing your teeth or using a toothpick because you may get an infection or bleed more easily. If you have any dental work done, tell your dentist you are receiving this medication. This medication can make you more sensitive to cold. Do not drink cold drinks or use ice. Cover exposed skin before coming in contact with cold temperatures or cold objects. When out in cold weather wear warm clothing and cover your mouth and nose to warm the air that goes into your lungs. Tell your care team if you get sensitive to the cold. Talk to your care team if you or your partner are pregnant or think either of you might be pregnant. This medication can cause serious birth defects if taken during pregnancy and for 9 months after the last dose. A negative pregnancy test is required before starting this medication. A reliable form of contraception is recommended while taking this medication and for 9 months after the  last dose. Talk to your care team about effective forms of contraception. Do not father a child while taking this medication and for 6 months after the last dose. Use a condom while having sex during this time period. Do not breastfeed while taking this medication and for 3 months after the last dose. This medication may cause infertility. Talk to your care team if you are concerned about your fertility. What side effects may I notice from receiving this medication? Side effects that you should report to  your care team as soon as possible: Allergic reactions--skin rash, itching, hives, swelling of the face, lips, tongue, or throat Bleeding--bloody or black, tar-like stools, vomiting blood or brown material that looks like coffee grounds, red or dark brown urine, small red or purple spots on skin, unusual bruising or bleeding Dry cough, shortness of breath or trouble breathing Heart rhythm changes--fast or irregular heartbeat, dizziness, feeling faint or lightheaded, chest pain, trouble breathing Infection--fever, chills, cough, sore throat, wounds that don't heal, pain or trouble when passing urine, general feeling of discomfort or being unwell Liver injury--right upper belly pain, loss of appetite, nausea, light-colored stool, dark yellow or brown urine, yellowing skin or eyes, unusual weakness or fatigue Low red blood cell level--unusual weakness or fatigue, dizziness, headache, trouble breathing Muscle injury--unusual weakness or fatigue, muscle pain, dark yellow or brown urine, decrease in amount of urine Pain, tingling, or numbness in the hands or feet Sudden and severe headache, confusion, change in vision, seizures, which may be signs of posterior reversible encephalopathy syndrome (PRES) Unusual bruising or bleeding Side effects that usually do not require medical attention (report to your care team if they continue or are bothersome): Diarrhea Nausea Pain, redness, or swelling with sores inside the mouth or throat Unusual weakness or fatigue Vomiting This list may not describe all possible side effects. Call your doctor for medical advice about side effects. You may report side effects to FDA at 1-800-FDA-1088. Where should I keep my medication? This medication is given in a hospital or clinic. It will not be stored at home. NOTE: This sheet is a summary. It may not cover all possible information. If you have questions about this medicine, talk to your doctor, pharmacist, or health care  provider.  2024 Elsevier/Gold Standard (2023-11-14 00:00:00)

## 2024-07-29 NOTE — Progress Notes (Signed)
 [facility] OFFICE PROGRESS NOTE   Diagnosis: Gastric cancer  INTERVAL HISTORY:   Henry May completed a cycle of zolbetuximab on 07/15/2024.  No nausea/vomiting, mouth sores, or diarrhea.  He has numbness in the toes and mild numbness in the fingers.  He dropped a plate twice on the same morning approximately 5 days following chemotherapy.  No arm or hand weakness.  No other neurologic symptoms.  He feels well.  He goes to the gym daily.  He has difficulty running secondary to numbness in the feet.  Objective:  Vital signs in last 24 hours:  Blood pressure 110/73, pulse 60, temperature 98.3 F (36.8 C), temperature source Temporal, resp. rate 18, height 6' (1.829 m), weight 202 lb 9.6 oz (91.9 kg), SpO2 98%.    HEENT: No thrush or ulcers Resp: Lungs bilaterally Cardio: Regular rate and rhythm GI: No hepatosplenomegaly, no mass, no apparent ascites, nontender Vascular: No leg edema Neuro: Mild to moderate loss of vibratory sense at the fingertips bilaterally  Portacath/PICC-without erythema  Lab Results:  Lab Results  Component Value Date   WBC 5.9 07/29/2024   HGB 13.8 07/29/2024   HCT 41.2 07/29/2024   MCV 97.4 07/29/2024   PLT 174 07/29/2024   NEUTROABS 3.4 07/29/2024    CMP  Lab Results  Component Value Date   NA 140 07/15/2024   K 4.2 07/15/2024   CL 105 07/15/2024   CO2 30 07/15/2024   GLUCOSE 131 (H) 07/15/2024   BUN 19 07/15/2024   CREATININE 0.97 07/15/2024   CALCIUM  9.4 07/15/2024   PROT 7.2 07/15/2024   ALBUMIN  4.1 07/15/2024   AST 35 07/15/2024   ALT 42 07/15/2024   ALKPHOS 175 (H) 07/15/2024   BILITOT 0.2 07/15/2024   GFRNONAA >60 07/15/2024   GFRAA >60 09/03/2020    Lab Results  Component Value Date   CEA 3.57 01/15/2024    Lab Results  Component Value Date   INR 1.0 01/22/2024   LABPROT 13.7 01/22/2024    Imaging:  No results found.  Medications: I have reviewed the patient's current  medications.   Assessment/Plan: Gastric cancer 11/21/2023 EGD-gastric body with infiltrative looking lesion; biopsy shows involvement of a hypercellular lesion that suggests diffuse carcinoma by morphology CTs abdomen/pelvis 11/28/2023-diffuse fatty infiltration of the liver; gastric wall thickening; lymph nodes up to 6 mm in diameter near the stomach wall. 12/31/2023 upper endoscopy-large diffuse friable infiltrative polypoid and ulcerated circumferential mass found in the gastric body.  Scope was passed beyond the mass with normal antrum/pylorus.  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  Negative for HER2 (1+); mismatch repair protein IHC normal; PD-L1 CPS 0%; CLDN18 positive: 90% of tumor cells with 2+/3+ membrane staining PET scan 01/07/2024-circumferential hypermetabolic gastric mucosal thickening.  Ill-defined hypermetabolic lymph nodes in the gastrohepatic ligament.  Intense hypermetabolic activity associated with the right lobe of the thyroid  gland.  Small hypermetabolic right axillary node favored reactive. 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  Gastric wall thickening slightly improved. Cycle 7 oxaliplatin , zolbetuximab 05/05/2024, Fulphila  Cycle  8 zolbetuximab 05/20/2024, oxaliplatin  held due to neuropathy, no  Fulphila  Cycle 9 oxaliplatin /zolbetuximab 06/03/2024, Fulphila  Cycle 10 oxaliplatin /zolbetuximab 06/17/2024, Fulphila  Cycle 11 oxaliplatin /zolbetuximab 06/30/2024, Fulphila , oxaliplatin  dose reduced Cycle 12 zolbetuximab 07/15/2024, oxaliplatin  held secondary to neuropathy Cycle 13 zolbetuximab 07/29/2024, oxaliplatin  held secondary to neuropathy Early satiety, postprandial abdominal pain, weight loss secondary to #1 History of bilateral lower extremity DVT 2021-anticoagulation discontinued due to massive retroperitoneal bleed, IVC filter placed 08/24/2020 Hospitalized with acute respiratory failure due to COVID-19 08/06/2020 - 09/04/2020 History of massive retroperitoneal bleed secondary to anticoagulation September 2021 Thyroid  ultrasound 01/13/2024-no abnormal nodule identified in the right lobe.  1.1 cm thyroid  isthmus nodule meets criteria for 1 year follow-up ultrasound. Admission 02/01/2024 with increased abdominal pain/ascites Hospitalization with altered mental status 02/05/2024 through 02/13/2024; question rare case of 5-FU psychosis.  Improved 02/16/2024.  Mental status at baseline 02/23/2024. Neutropenia 02/16/2024 Mucositis 02/16/2024.  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  Persistent cold sensitivity 05/20/2024, oxaliplatin  held.  Cold sensitivity resolved 06/03/2024, oxaliplatin  resumed.  Mild loss of vibratory sense, oxaliplatin  dose reduced 06/29/2024      Disposition: Henry Poitras tolerated the last cycle of zolbetuximab well.  He has developed oxaliplatin  neuropathy.  The neuropathy likely explains the plate dropping 2 weeks ago.  Oxaliplatin  will remain on hold.  He will complete another treatment with zolbetuximab today.  He will return for an office visit and zolbetuximab in 2 weeks.  He will be referred for a restaging CT  evaluation after the next cycle of zolbetuximab.  Arley Hof, MD  07/29/2024  9:01 AM

## 2024-07-30 ENCOUNTER — Telehealth: Payer: Self-pay | Admitting: Oncology

## 2024-07-30 ENCOUNTER — Telehealth: Payer: Self-pay | Admitting: *Deleted

## 2024-07-30 NOTE — Telephone Encounter (Signed)
 Mrs. Pistilli had left message with reception that they will not be here for treatment on 8/28 and need to discuss this. Called and left VM for them to call back next week or send MyChart message as to when they want to reschedule the 8/28  treatment

## 2024-07-31 ENCOUNTER — Inpatient Hospital Stay

## 2024-08-01 ENCOUNTER — Encounter: Payer: Self-pay | Admitting: Oncology

## 2024-08-04 ENCOUNTER — Inpatient Hospital Stay

## 2024-08-04 NOTE — Progress Notes (Unsigned)
 [facility] CSW Progress Note  Clinical Child psychotherapist contacted patient by phone to discuss SNAP application. Patient confirmed SNAP application was mailed in last week. Patient and spouse are traveling due to a familial emergency. CSW allowed space for patient's spouse to discuss emotional impact, and offered thoughts and support.     Interventions: CSW messaged DSS eligibility caseworker to see if documents have been received.      Follow Up Plan:  Patient will contact CSW with any support or resource needs and CSW will follow-up with patient by phone   8/21 update:   CSW received status of patient's SNAP benefits. Message forwarded to patient and spouse.   Lizbeth Sprague, LCSW Clinical Social Worker [facility]

## 2024-08-05 ENCOUNTER — Encounter: Payer: Self-pay | Admitting: Oncology

## 2024-08-10 ENCOUNTER — Other Ambulatory Visit: Payer: Self-pay

## 2024-08-12 ENCOUNTER — Ambulatory Visit: Admitting: Oncology

## 2024-08-12 ENCOUNTER — Other Ambulatory Visit

## 2024-08-12 ENCOUNTER — Ambulatory Visit

## 2024-08-13 NOTE — Progress Notes (Signed)
 [facility] CSW Progress Note  Clinical Child psychotherapist reached out to patient's spouse via email (typical form of communications) to follow-up on emotional support. CSW also reminded about [facility] re certification.       Lizbeth Sprague, LCSW Clinical Social Worker [facility]

## 2024-08-20 ENCOUNTER — Other Ambulatory Visit: Payer: Self-pay | Admitting: Oncology

## 2024-08-24 ENCOUNTER — Other Ambulatory Visit: Payer: Self-pay

## 2024-08-26 ENCOUNTER — Inpatient Hospital Stay

## 2024-08-26 ENCOUNTER — Inpatient Hospital Stay: Attending: Nurse Practitioner | Admitting: Oncology

## 2024-08-26 ENCOUNTER — Inpatient Hospital Stay: Attending: Nurse Practitioner

## 2024-08-26 VITALS — BP 100/63 | HR 53 | Temp 98.2°F | Resp 16

## 2024-08-26 VITALS — BP 117/72 | HR 60 | Temp 97.6°F | Resp 18 | Ht 72.0 in | Wt 202.1 lb

## 2024-08-26 DIAGNOSIS — C162 Malignant neoplasm of body of stomach: Secondary | ICD-10-CM | POA: Diagnosis present

## 2024-08-26 DIAGNOSIS — C169 Malignant neoplasm of stomach, unspecified: Secondary | ICD-10-CM

## 2024-08-26 DIAGNOSIS — D709 Neutropenia, unspecified: Secondary | ICD-10-CM | POA: Diagnosis not present

## 2024-08-26 DIAGNOSIS — Z5111 Encounter for antineoplastic chemotherapy: Secondary | ICD-10-CM | POA: Insufficient documentation

## 2024-08-26 DIAGNOSIS — Z86718 Personal history of other venous thrombosis and embolism: Secondary | ICD-10-CM | POA: Insufficient documentation

## 2024-08-26 DIAGNOSIS — G629 Polyneuropathy, unspecified: Secondary | ICD-10-CM | POA: Insufficient documentation

## 2024-08-26 DIAGNOSIS — C786 Secondary malignant neoplasm of retroperitoneum and peritoneum: Secondary | ICD-10-CM | POA: Diagnosis present

## 2024-08-26 LAB — CMP (CANCER CENTER ONLY)
ALT: 40 U/L (ref 0–44)
AST: 33 U/L (ref 15–41)
Albumin: 4.2 g/dL (ref 3.5–5.0)
Alkaline Phosphatase: 142 U/L — ABNORMAL HIGH (ref 38–126)
Anion gap: 12 (ref 5–15)
BUN: 16 mg/dL (ref 6–20)
CO2: 23 mmol/L (ref 22–32)
Calcium: 9.7 mg/dL (ref 8.9–10.3)
Chloride: 106 mmol/L (ref 98–111)
Creatinine: 0.97 mg/dL (ref 0.61–1.24)
GFR, Estimated: >60 mL/min (ref >60)
Glucose, Bld: 97 mg/dL (ref 70–99)
Potassium: 4.2 mmol/L (ref 3.5–5.1)
Sodium: 141 mmol/L (ref 135–145)
Total Bilirubin: 0.3 mg/dL (ref 0.0–1.2)
Total Protein: 7.3 g/dL (ref 6.5–8.1)

## 2024-08-26 LAB — CBC WITH DIFFERENTIAL (CANCER CENTER ONLY)
Abs Immature Granulocytes: 0.01 K/uL (ref 0.00–0.07)
Basophils Absolute: 0 K/uL (ref 0.0–0.1)
Basophils Relative: 1 %
Eosinophils Absolute: 0.3 K/uL (ref 0.0–0.5)
Eosinophils Relative: 6 %
HCT: 41.5 % (ref 39.0–52.0)
Hemoglobin: 14.1 g/dL (ref 13.0–17.0)
Immature Granulocytes: 0 %
Lymphocytes Relative: 28 %
Lymphs Abs: 1.5 K/uL (ref 0.7–4.0)
MCH: 31.8 pg (ref 26.0–34.0)
MCHC: 34 g/dL (ref 30.0–36.0)
MCV: 93.7 fL (ref 80.0–100.0)
Monocytes Absolute: 0.4 K/uL (ref 0.1–1.0)
Monocytes Relative: 8 %
Neutro Abs: 2.9 K/uL (ref 1.7–7.7)
Neutrophils Relative %: 57 %
Platelet Count: 191 K/uL (ref 150–400)
RBC: 4.43 MIL/uL (ref 4.22–5.81)
RDW: 12.4 % (ref 11.5–15.5)
WBC Count: 5.2 K/uL (ref 4.0–10.5)
nRBC: 0 % (ref 0.0–0.2)

## 2024-08-26 MED ORDER — SODIUM CHLORIDE 0.9 % IV SOLN
150.0000 mg | Freq: Once | INTRAVENOUS | Status: AC
  Administered 2024-08-26: 150 mg via INTRAVENOUS
  Filled 2024-08-26: qty 150

## 2024-08-26 MED ORDER — ZOLBETUXIMAB-CLZB CHEMO 100MG/5ML IV SOLN
387.5000 mg/m2 | Freq: Once | INTRAVENOUS | Status: AC
  Administered 2024-08-26: 800 mg via INTRAVENOUS
  Filled 2024-08-26: qty 30

## 2024-08-26 MED ORDER — OLANZAPINE 5 MG PO TABS
5.0000 mg | ORAL_TABLET | Freq: Once | ORAL | Status: AC
  Administered 2024-08-26: 5 mg via ORAL
  Filled 2024-08-26: qty 1

## 2024-08-26 MED ORDER — SODIUM CHLORIDE 0.9 % IV SOLN: INTRAVENOUS | Status: DC

## 2024-08-26 MED ORDER — DEXAMETHASONE SODIUM PHOSPHATE 10 MG/ML IJ SOLN
10.0000 mg | Freq: Once | INTRAMUSCULAR | Status: AC
  Administered 2024-08-26: 10 mg via INTRAVENOUS
  Filled 2024-08-26: qty 1

## 2024-08-26 MED ORDER — PALONOSETRON HCL INJECTION 0.25 MG/5ML
0.2500 mg | Freq: Once | INTRAVENOUS | Status: AC
  Administered 2024-08-26: 0.25 mg via INTRAVENOUS
  Filled 2024-08-26: qty 5

## 2024-08-26 NOTE — Patient Instructions (Signed)
 [facility] - A DEPT OF [facility]   Discharge Instructions: Thank you for choosing [facility] to provide your oncology and hematology care.   If you have a lab appointment with the Cancer Center, please go directly to the Cancer Center and check in at the registration area.   Wear comfortable clothing and clothing appropriate for easy access to any Portacath or PICC line.   We strive to give you quality time with your provider. You may need to reschedule your appointment if you arrive late (15 or more minutes).  Arriving late affects you and other patients whose appointments are after yours.  Also, if you miss three or more appointments without notifying the office, you may be dismissed from the clinic at the provider's discretion.      For prescription refill requests, have your pharmacy contact our office and allow 72 hours for refills to be completed.    Today you received the following chemotherapy and/or immunotherapy agents VYLOY     To help prevent nausea and vomiting after your treatment, we encourage you to take your nausea medication as directed.  BELOW ARE SYMPTOMS THAT SHOULD BE REPORTED IMMEDIATELY: *FEVER GREATER THAN 100.4 F (38 C) OR HIGHER *CHILLS OR SWEATING *NAUSEA AND VOMITING THAT IS NOT CONTROLLED WITH YOUR NAUSEA MEDICATION *UNUSUAL SHORTNESS OF BREATH *UNUSUAL BRUISING OR BLEEDING *URINARY PROBLEMS (pain or burning when urinating, or frequent urination) *BOWEL PROBLEMS (unusual diarrhea, constipation, pain near the anus) TENDERNESS IN MOUTH AND THROAT WITH OR WITHOUT PRESENCE OF ULCERS (sore throat, sores in mouth, or a toothache) UNUSUAL RASH, SWELLING OR PAIN  UNUSUAL VAGINAL DISCHARGE OR ITCHING   Items with * indicate a potential emergency and should be followed up as soon as possible or go to the Emergency Department if any problems should occur.  Please show the CHEMOTHERAPY ALERT CARD or IMMUNOTHERAPY  ALERT CARD at check-in to the Emergency Department and triage nurse.  Should you have questions after your visit or need to cancel or reschedule your appointment, please contact [facility]  Dept: [phone]  and follow the prompts.  Office hours are 8:00 a.m. to 4:30 p.m. Monday - Friday. Please note that voicemails left after 4:00 p.m. may not be returned until the following business day.  We are closed weekends and major holidays. You have access to a nurse at all times for urgent questions. Please call the main number to the clinic Dept: [phone] and follow the prompts.   For any non-urgent questions, you may also contact your provider using MyChart. We now offer e-Visits for anyone 96 and older to request care online for non-urgent symptoms. For details visit [email or web address].   Also download the MyChart app! Go to the app store, search "MyChart", open the app, select [place], and log in with your MyChart username and password.  Oxaliplatin  Injection What is this medication? OXALIPLATIN  (ox AL i PLA tin) treats colorectal cancer. It works by slowing down the growth of cancer cells. This medicine may be used for other purposes; ask your health care provider or pharmacist if you have questions. COMMON BRAND NAME(S): Eloxatin  What should I tell my care team before I take this medication? They need to know if you have any of these conditions: Heart disease History of irregular heartbeat or rhythm Liver disease Low blood cell levels (white cells, red cells, and platelets) Lung or breathing disease, such  as asthma Take medications that treat or prevent blood clots Tingling of the fingers, toes, or other nerve disorder An unusual or allergic reaction to oxaliplatin , other medications, foods, dyes, or preservatives If you or your partner are pregnant or trying to get pregnant Breast-feeding How should I use this  medication? This medication is injected into a vein. It is given by your care team in a hospital or clinic setting. Talk to your care team about the use of this medication in children. Special care may be needed. Overdosage: If you think you have taken too much of this medicine contact a poison control center or emergency room at once. NOTE: This medicine is only for you. Do not share this medicine with others. What if I miss a dose? Keep appointments for follow-up doses. It is important not to miss a dose. Call your care team if you are unable to keep an appointment. What may interact with this medication? Do not take this medication with any of the following: Cisapride Dronedarone Pimozide Thioridazine This medication may also interact with the following: Aspirin and aspirin-like medications Certain medications that treat or prevent blood clots, such as warfarin, apixaban, dabigatran, and rivaroxaban Cisplatin Cyclosporine Diuretics Medications for infection, such as acyclovir, adefovir, amphotericin B, bacitracin, cidofovir, foscarnet, ganciclovir, gentamicin, pentamidine, vancomycin  NSAIDs, medications for pain and inflammation, such as ibuprofen or naproxen Other medications that cause heart rhythm changes Pamidronate Zoledronic acid This list may not describe all possible interactions. Give your health care provider a list of all the medicines, herbs, non-prescription drugs, or dietary supplements you use. Also tell them if you smoke, drink alcohol, or use illegal drugs. Some items may interact with your medicine. What should I watch for while using this medication? Your condition will be monitored carefully while you are receiving this medication. You may need blood work while taking this medication. This medication may make you feel generally unwell. This is not uncommon as chemotherapy can affect healthy cells as well as cancer cells. Report any side effects. Continue your course  of treatment even though you feel ill unless your care team tells you to stop. This medication may increase your risk of getting an infection. Call your care team for advice if you get a fever, chills, sore throat, or other symptoms of a cold or flu. Do not treat yourself. Try to avoid being around people who are sick. Avoid taking medications that contain aspirin, acetaminophen , ibuprofen, naproxen, or ketoprofen unless instructed by your care team. These medications may hide a fever. Be careful brushing or flossing your teeth or using a toothpick because you may get an infection or bleed more easily. If you have any dental work done, tell your dentist you are receiving this medication. This medication can make you more sensitive to cold. Do not drink cold drinks or use ice. Cover exposed skin before coming in contact with cold temperatures or cold objects. When out in cold weather wear warm clothing and cover your mouth and nose to warm the air that goes into your lungs. Tell your care team if you get sensitive to the cold. Talk to your care team if you or your partner are pregnant or think either of you might be pregnant. This medication can cause serious birth defects if taken during pregnancy and for 9 months after the last dose. A negative pregnancy test is required before starting this medication. A reliable form of contraception is recommended while taking this medication and for 9 months after the  last dose. Talk to your care team about effective forms of contraception. Do not father a child while taking this medication and for 6 months after the last dose. Use a condom while having sex during this time period. Do not breastfeed while taking this medication and for 3 months after the last dose. This medication may cause infertility. Talk to your care team if you are concerned about your fertility. What side effects may I notice from receiving this medication? Side effects that you should report to  your care team as soon as possible: Allergic reactions--skin rash, itching, hives, swelling of the face, lips, tongue, or throat Bleeding--bloody or black, tar-like stools, vomiting blood or brown material that looks like coffee grounds, red or dark brown urine, small red or purple spots on skin, unusual bruising or bleeding Dry cough, shortness of breath or trouble breathing Heart rhythm changes--fast or irregular heartbeat, dizziness, feeling faint or lightheaded, chest pain, trouble breathing Infection--fever, chills, cough, sore throat, wounds that don't heal, pain or trouble when passing urine, general feeling of discomfort or being unwell Liver injury--right upper belly pain, loss of appetite, nausea, light-colored stool, dark yellow or brown urine, yellowing skin or eyes, unusual weakness or fatigue Low red blood cell level--unusual weakness or fatigue, dizziness, headache, trouble breathing Muscle injury--unusual weakness or fatigue, muscle pain, dark yellow or brown urine, decrease in amount of urine Pain, tingling, or numbness in the hands or feet Sudden and severe headache, confusion, change in vision, seizures, which may be signs of posterior reversible encephalopathy syndrome (PRES) Unusual bruising or bleeding Side effects that usually do not require medical attention (report to your care team if they continue or are bothersome): Diarrhea Nausea Pain, redness, or swelling with sores inside the mouth or throat Unusual weakness or fatigue Vomiting This list may not describe all possible side effects. Call your doctor for medical advice about side effects. You may report side effects to FDA at 1-800-FDA-1088. Where should I keep my medication? This medication is given in a hospital or clinic. It will not be stored at home. NOTE: This sheet is a summary. It may not cover all possible information. If you have questions about this medicine, talk to your doctor, pharmacist, or health care  provider.  2024 Elsevier/Gold Standard (2023-11-14 00:00:00)

## 2024-08-26 NOTE — Progress Notes (Signed)
 [facility] OFFICE PROGRESS NOTE   Diagnosis: Gastric cancer  INTERVAL HISTORY:   Henry May completed another treatment with zolbetuximab on 07/29/2024.  No nausea.  Good appetite and energy level.  He is exercising.  No difficulty with bowel function.  He continues to have numbness in the extremities.  Objective:  Vital signs in last 24 hours:  Blood pressure 117/72, pulse 60, temperature 97.6 F (36.4 C), temperature source Temporal, resp. rate 18, height 6' (1.829 m), weight 202 lb 1.6 oz (91.7 kg), SpO2 99%.    HEENT: No thrush or ulcers Resp: Lungs clear bilaterally Cardio: Regular rate and rhythm GI: No hepatosplenomegaly, no apparent ascites, no mass, nontender Vascular: No leg edema   Portacath/PICC-without erythema  Lab Results:  Lab Results  Component Value Date   WBC 5.2 08/26/2024   HGB 14.1 08/26/2024   HCT 41.5 08/26/2024   MCV 93.7 08/26/2024   PLT 191 08/26/2024   NEUTROABS 2.9 08/26/2024    CMP  Lab Results  Component Value Date   NA 141 07/29/2024   K 4.0 07/29/2024   CL 106 07/29/2024   CO2 23 07/29/2024   GLUCOSE 117 (H) 07/29/2024   BUN 15 07/29/2024   CREATININE 0.96 07/29/2024   CALCIUM  9.4 07/29/2024   PROT 7.0 07/29/2024   ALBUMIN  4.0 07/29/2024   AST 32 07/29/2024   ALT 35 07/29/2024   ALKPHOS 134 (H) 07/29/2024   BILITOT 0.3 07/29/2024   GFRNONAA >60 07/29/2024   GFRAA >60 09/03/2020    Lab Results  Component Value Date   CEA 3.57 01/15/2024      Medications: I have reviewed the patient's current medications.   Assessment/Plan: Gastric cancer 11/21/2023 EGD-gastric body with infiltrative looking lesion; biopsy shows involvement of a hypercellular lesion that suggests diffuse carcinoma by morphology CTs abdomen/pelvis 11/28/2023-diffuse fatty infiltration of the liver; gastric wall thickening; lymph nodes up to 6 mm in diameter near the stomach wall. 12/31/2023 upper endoscopy-large diffuse friable  infiltrative polypoid and ulcerated circumferential mass found in the gastric body.  Scope was passed beyond the mass with normal antrum/pylorus.  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  Negative for HER2 (1+); mismatch repair protein IHC normal; PD-L1 CPS 0%; CLDN18 positive: 90% of tumor cells with 2+/3+ membrane staining PET scan 01/07/2024-circumferential hypermetabolic gastric mucosal thickening.  Ill-defined hypermetabolic lymph nodes in the gastrohepatic ligament.  Intense hypermetabolic activity associated with the right lobe of the thyroid  gland.  Small hypermetabolic right axillary node favored reactive. 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  Gastric wall thickening slightly improved. Cycle 7 oxaliplatin , zolbetuximab 05/05/2024, Fulphila  Cycle 8 zolbetuximab 05/20/2024, oxaliplatin  held due to neuropathy, no Fulphila  Cycle 9 oxaliplatin /zolbetuximab 06/03/2024, Fulphila  Cycle 10 oxaliplatin /zolbetuximab 06/17/2024, Fulphila  Cycle 11 oxaliplatin /zolbetuximab 06/30/2024, Fulphila , oxaliplatin  dose reduced Cycle 12 zolbetuximab 07/15/2024, oxaliplatin  held secondary to neuropathy Cycle 13 zolbetuximab 07/29/2024, oxaliplatin  held secondary to neuropathy Cycle 14 zolbetuximab 08/26/2024, oxaliplatin  held secondary to neuropathy Early  satiety, postprandial abdominal pain, weight loss secondary to #1 History of bilateral lower extremity DVT 2021-anticoagulation  discontinued due to massive retroperitoneal bleed, IVC filter placed 08/24/2020 Hospitalized with acute respiratory failure due to COVID-19 08/06/2020 - 09/04/2020 History of massive retroperitoneal bleed secondary to anticoagulation September 2021 Thyroid  ultrasound 01/13/2024-no abnormal nodule identified in the right lobe.  1.1 cm thyroid  isthmus nodule meets criteria for 1 year follow-up ultrasound. Admission 02/01/2024 with increased abdominal pain/ascites Hospitalization with altered mental status 02/05/2024 through 02/13/2024; question rare case of 5-FU psychosis.  Improved 02/16/2024.  Mental status at baseline 02/23/2024. Neutropenia 02/16/2024 Mucositis 02/16/2024.  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  Persistent cold sensitivity 05/20/2024, oxaliplatin  held.  Cold sensitivity resolved 06/03/2024, oxaliplatin  resumed.  Mild loss of vibratory sense, oxaliplatin  dose reduced 06/29/2024       Disposition: Henry May appears unchanged.  He is tolerating the zolbetuximab well.  He will complete another cycle today.  Will undergo restaging CT prior to an office visit in 2 weeks.  Arley Hof, MD  08/26/2024  8:47 AM

## 2024-08-26 NOTE — Progress Notes (Signed)
 Patient seen by Dr. Arley Hof today  Vitals are within treatment parameters:Yes   Labs are within treatment parameters: Yes   Treatment plan has been signed: Yes   Per physician team, Patient is ready for treatment and there are NO modifications to the treatment plan.

## 2024-08-26 NOTE — Progress Notes (Signed)
 Pt tolerated Vyloy  titration and infusion well without side effects. Remained in infusion for two hours for observation. Departed ambulatory with his wife.

## 2024-08-26 NOTE — Patient Instructions (Signed)

## 2024-08-30 ENCOUNTER — Telehealth: Payer: Self-pay | Admitting: *Deleted

## 2024-08-30 NOTE — Telephone Encounter (Signed)
 Notified Mrs. Quarry of port access and CT scan on 9/17 at 1:45 pm.

## 2024-09-01 ENCOUNTER — Ambulatory Visit (HOSPITAL_BASED_OUTPATIENT_CLINIC_OR_DEPARTMENT_OTHER)
Admission: RE | Admit: 2024-09-01 | Discharge: 2024-09-01 | Disposition: A | Discharge status: Home / Self Care | Source: Ambulatory Visit | Attending: Oncology | Admitting: Oncology

## 2024-09-01 ENCOUNTER — Inpatient Hospital Stay

## 2024-09-01 DIAGNOSIS — C169 Malignant neoplasm of stomach, unspecified: Secondary | ICD-10-CM | POA: Diagnosis present

## 2024-09-01 MED ORDER — IOHEXOL 300 MG/ML  SOLN
100.0000 mL | Freq: Once | INTRAMUSCULAR | Status: AC | PRN
Start: 2024-09-01 — End: 2024-09-01
  Administered 2024-09-01: 100 mL via INTRAVENOUS

## 2024-09-01 MED ORDER — HEPARIN SOD (PORK) LOCK FLUSH 100 UNIT/ML IV SOLN
500.0000 [IU] | Freq: Once | INTRAVENOUS | Status: AC
Start: 2024-09-01 — End: 2024-09-01
  Administered 2024-09-01: 500 [IU] via INTRAVENOUS

## 2024-09-02 ENCOUNTER — Other Ambulatory Visit: Payer: Self-pay

## 2024-09-03 ENCOUNTER — Encounter

## 2024-09-09 ENCOUNTER — Inpatient Hospital Stay

## 2024-09-09 ENCOUNTER — Inpatient Hospital Stay (HOSPITAL_BASED_OUTPATIENT_CLINIC_OR_DEPARTMENT_OTHER): Admitting: Nurse Practitioner

## 2024-09-09 ENCOUNTER — Encounter: Payer: Self-pay | Admitting: Nurse Practitioner

## 2024-09-09 VITALS — BP 113/75 | HR 85 | Temp 97.8°F | Resp 18 | Ht 72.0 in | Wt 203.8 lb

## 2024-09-09 VITALS — BP 128/70 | HR 51 | Temp 97.2°F | Resp 18

## 2024-09-09 DIAGNOSIS — C162 Malignant neoplasm of body of stomach: Secondary | ICD-10-CM | POA: Diagnosis not present

## 2024-09-09 DIAGNOSIS — C169 Malignant neoplasm of stomach, unspecified: Secondary | ICD-10-CM | POA: Diagnosis not present

## 2024-09-09 DIAGNOSIS — Z5111 Encounter for antineoplastic chemotherapy: Secondary | ICD-10-CM | POA: Diagnosis not present

## 2024-09-09 LAB — CBC WITH DIFFERENTIAL (CANCER CENTER ONLY)
Abs Immature Granulocytes: 0.01 K/uL (ref 0.00–0.07)
Basophils Absolute: 0 K/uL (ref 0.0–0.1)
Basophils Relative: 0 %
Eosinophils Absolute: 0.3 K/uL (ref 0.0–0.5)
Eosinophils Relative: 6 %
HCT: 42.1 % (ref 39.0–52.0)
Hemoglobin: 14.1 g/dL (ref 13.0–17.0)
Immature Granulocytes: 0 %
Lymphocytes Relative: 29 %
Lymphs Abs: 1.6 K/uL (ref 0.7–4.0)
MCH: 32 pg (ref 26.0–34.0)
MCHC: 33.5 g/dL (ref 30.0–36.0)
MCV: 95.5 fL (ref 80.0–100.0)
Monocytes Absolute: 0.4 K/uL (ref 0.1–1.0)
Monocytes Relative: 7 %
Neutro Abs: 3.3 K/uL (ref 1.7–7.7)
Neutrophils Relative %: 58 %
Platelet Count: 163 K/uL (ref 150–400)
RBC: 4.41 MIL/uL (ref 4.22–5.81)
RDW: 12.6 % (ref 11.5–15.5)
WBC Count: 5.6 K/uL (ref 4.0–10.5)
nRBC: 0 % (ref 0.0–0.2)

## 2024-09-09 LAB — CMP (CANCER CENTER ONLY)
ALT: 29 U/L (ref 0–44)
AST: 28 U/L (ref 15–41)
Albumin: 3.9 g/dL (ref 3.5–5.0)
Alkaline Phosphatase: 128 U/L — ABNORMAL HIGH (ref 38–126)
Anion gap: 12 (ref 5–15)
BUN: 18 mg/dL (ref 6–20)
CO2: 23 mmol/L (ref 22–32)
Calcium: 9.4 mg/dL (ref 8.9–10.3)
Chloride: 108 mmol/L (ref 98–111)
Creatinine: 0.97 mg/dL (ref 0.61–1.24)
GFR, Estimated: >60 mL/min (ref >60)
Glucose, Bld: 110 mg/dL — ABNORMAL HIGH (ref 70–99)
Potassium: 3.8 mmol/L (ref 3.5–5.1)
Sodium: 142 mmol/L (ref 135–145)
Total Bilirubin: 0.3 mg/dL (ref 0.0–1.2)
Total Protein: 7 g/dL (ref 6.5–8.1)

## 2024-09-09 MED ORDER — SODIUM CHLORIDE 0.9 % IV SOLN: INTRAVENOUS | Status: DC

## 2024-09-09 MED ORDER — DEXAMETHASONE 4 MG PO TABS: 4.0000 mg | ORAL_TABLET | Freq: Every day | ORAL | 2 refills | Status: DC

## 2024-09-09 MED ORDER — OLANZAPINE 5 MG PO TABS
5.0000 mg | ORAL_TABLET | Freq: Once | ORAL | Status: AC
  Administered 2024-09-09: 5 mg via ORAL
  Filled 2024-09-09: qty 1

## 2024-09-09 MED ORDER — ZOLBETUXIMAB-CLZB CHEMO 100MG/5ML IV SOLN
387.5000 mg/m2 | Freq: Once | INTRAVENOUS | Status: AC
  Administered 2024-09-09: 800 mg via INTRAVENOUS
  Filled 2024-09-09: qty 30

## 2024-09-09 MED ORDER — SODIUM CHLORIDE 0.9 % IV SOLN
150.0000 mg | Freq: Once | INTRAVENOUS | Status: AC
  Administered 2024-09-09: 150 mg via INTRAVENOUS
  Filled 2024-09-09: qty 150

## 2024-09-09 MED ORDER — PALONOSETRON HCL INJECTION 0.25 MG/5ML
0.2500 mg | Freq: Once | INTRAVENOUS | Status: AC
  Administered 2024-09-09: 0.25 mg via INTRAVENOUS
  Filled 2024-09-09: qty 5

## 2024-09-09 MED ORDER — DEXAMETHASONE SODIUM PHOSPHATE 10 MG/ML IJ SOLN
10.0000 mg | Freq: Once | INTRAMUSCULAR | Status: AC
  Administered 2024-09-09: 10 mg via INTRAVENOUS
  Filled 2024-09-09: qty 1

## 2024-09-09 MED ORDER — SCOPOLAMINE 1 MG/3DAYS TD PT72: 1.0000 | MEDICATED_PATCH | TRANSDERMAL | 2 refills | Status: DC

## 2024-09-09 NOTE — Progress Notes (Signed)
 Patient seen by Olam Ned NP today  Vitals are within treatment parameters:Yes   Labs are within treatment parameters: Yes   Treatment plan has been signed: Yes   Per physician team, Patient is ready for treatment and there are NO modifications to the treatment plan.

## 2024-09-09 NOTE — Progress Notes (Signed)
 [facility] OFFICE PROGRESS NOTE   Diagnosis: Gastric cancer  INTERVAL HISTORY:   [facility] he returns as scheduled.  He completed another cycle of zolbetuximab 08/26/2024.  He denies nausea/vomiting.  No mouth sores.  No diarrhea.  No rash.  He denies pain.  He has a good appetite.  He has a good energy level.  Main complaint is weakness for 2 to 3 days after each treatment.  Neuropathy symptoms are slightly improved.  Objective:  Vital signs in last 24 hours:  Blood pressure 113/75, pulse 85, temperature 97.8 F (36.6 C), temperature source Temporal, resp. rate 18, height 6' (1.829 m), weight 203 lb 12.8 oz (92.4 kg), SpO2 100%.    HEENT: No thrush or ulcers. Resp: Lungs clear bilaterally. Cardio: Regular rate and rhythm. GI: No hepatosplenomegaly.  Nontender.  No apparent ascites. Vascular: No leg edema. Skin: No rash. Port-A-Cath without erythema.  Lab Results:  Lab Results  Component Value Date   WBC 5.6 09/09/2024   HGB 14.1 09/09/2024   HCT 42.1 09/09/2024   MCV 95.5 09/09/2024   PLT 163 09/09/2024   NEUTROABS 3.3 09/09/2024    Imaging:  No results found.  Medications: I have reviewed the patient's current medications.  Assessment/Plan: Gastric cancer 11/21/2023 EGD-gastric body with infiltrative looking lesion; biopsy shows involvement of a hypercellular lesion that suggests diffuse carcinoma by morphology CTs abdomen/pelvis 11/28/2023-diffuse fatty infiltration of the liver; gastric wall thickening; lymph nodes up to 6 mm in diameter near the stomach wall. 12/31/2023 upper endoscopy-large diffuse friable infiltrative polypoid and ulcerated circumferential mass found in the gastric body.  Scope was passed beyond the mass with normal antrum/pylorus.  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  Negative for HER2 (1+); mismatch repair protein IHC normal; PD-L1 CPS 0%;  CLDN18 positive: 90% of tumor cells with 2+/3+ membrane staining PET scan 01/07/2024-circumferential hypermetabolic gastric mucosal thickening.  Ill-defined hypermetabolic lymph nodes in the gastrohepatic ligament.  Intense hypermetabolic activity associated with the right lobe of the thyroid  gland.  Small hypermetabolic right axillary node favored reactive. 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  Gastric wall thickening slightly improved. Cycle 7 oxaliplatin , zolbetuximab 05/05/2024, Fulphila  Cycle 8 zolbetuximab 05/20/2024, oxaliplatin  held due to neuropathy, no Fulphila  Cycle 9 oxaliplatin /zolbetuximab 06/03/2024, Fulphila  Cycle 10 oxaliplatin /zolbetuximab 06/17/2024, Fulphila  Cycle 11 oxaliplatin /zolbetuximab 06/30/2024, Fulphila , oxaliplatin  dose reduced Cycle 12 zolbetuximab 07/15/2024, oxaliplatin  held secondary to neuropathy Cycle 13 zolbetuximab 07/29/2024, oxaliplatin  held secondary to neuropathy Cycle 14 zolbetuximab 08/26/2024, oxaliplatin  held secondary to neuropathy 09/01/2024 CTs-mild distal gastric wall thickening without discrete mass.  No metastatic disease. Cycle 15 zolbetuximab 09/09/2024, oxaliplatin  held due to neuropathy Early satiety, postprandial abdominal pain, weight loss secondary to #1 History of bilateral lower extremity DVT 2021-anticoagulation discontinued due to massive retroperitoneal  bleed, IVC filter placed 08/24/2020 Hospitalized with acute respiratory failure due to COVID-19 08/06/2020 - 09/04/2020 History of massive retroperitoneal bleed secondary to anticoagulation September 2021 Thyroid  ultrasound 01/13/2024-no abnormal nodule identified in the right lobe.  1.1 cm thyroid  isthmus nodule meets criteria for 1 year follow-up  ultrasound. Admission 02/01/2024 with increased abdominal pain/ascites Hospitalization with altered mental status 02/05/2024 through 02/13/2024; question rare case of 5-FU psychosis.  Improved 02/16/2024.  Mental status at baseline 02/23/2024. Neutropenia 02/16/2024 Mucositis 02/16/2024.  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  Persistent cold sensitivity 05/20/2024, oxaliplatin  held.  Cold sensitivity resolved 06/03/2024, oxaliplatin  resumed.  Mild loss of vibratory sense, oxaliplatin  dose reduced 06/29/2024    Disposition: Henry May appears stable.  He continues treatment with zolbetuximab.  He is tolerating well.  There is no clinical evidence of disease progression.  He has a good performance status.  Recent restaging CTs show ongoing response.  He understands and agrees with the recommendation to continue treatment.  Schedule going forward will be adjusted to every 3 weeks.  CBC and chemistry panel reviewed.  Labs adequate for treatment.  He will return for follow-up and the next cycle in 3 weeks.  We are available to see him sooner if needed.  Patient seen with Dr. Cloretta.  CT report/images reviewed with him and his wife.  Olam Ned ANP/GNP-BC   09/09/2024  8:41 AM  This was a shared visit with Olam Ned.  Henry May appears stable.  We reviewed the restaging CT findings and images with him.  The CTs are consistent with an ongoing response to therapy.  The plan is to continue  zolbetuximab.  He prefers less frequent treatment.  Zolbetuximab will be changed to an every 3-week schedule.  I was present for greater than 50% of today's visit.  I performed medical decision making.  Arvella Cloretta, MD

## 2024-09-09 NOTE — Patient Instructions (Signed)
 [facility] - A DEPT OF [facility]  Discharge Instructions: Thank you for choosing [facility] to provide your oncology and hematology care.   If you have a lab appointment with the Cancer Center, please go directly to the Cancer Center and check in at the registration area.   Wear comfortable clothing and clothing appropriate for easy access to any Portacath or PICC line.   We strive to give you quality time with your provider. You may need to reschedule your appointment if you arrive late (15 or more minutes).  Arriving late affects you and other patients whose appointments are after yours.  Also, if you miss three or more appointments without notifying the office, you may be dismissed from the clinic at the provider's discretion.      For prescription refill requests, have your pharmacy contact our office and allow 72 hours for refills to be completed.    Today you received the following chemotherapy and/or immunotherapy agents: vyloy       To help prevent nausea and vomiting after your treatment, we encourage you to take your nausea medication as directed.  BELOW ARE SYMPTOMS THAT SHOULD BE REPORTED IMMEDIATELY: *FEVER GREATER THAN 100.4 F (38 C) OR HIGHER *CHILLS OR SWEATING *NAUSEA AND VOMITING THAT IS NOT CONTROLLED WITH YOUR NAUSEA MEDICATION *UNUSUAL SHORTNESS OF BREATH *UNUSUAL BRUISING OR BLEEDING *URINARY PROBLEMS (pain or burning when urinating, or frequent urination) *BOWEL PROBLEMS (unusual diarrhea, constipation, pain near the anus) TENDERNESS IN MOUTH AND THROAT WITH OR WITHOUT PRESENCE OF ULCERS (sore throat, sores in mouth, or a toothache) UNUSUAL RASH, SWELLING OR PAIN  UNUSUAL VAGINAL DISCHARGE OR ITCHING   Items with * indicate a potential emergency and should be followed up as soon as possible or go to the Emergency Department if any problems should occur.  Please show the CHEMOTHERAPY ALERT CARD or IMMUNOTHERAPY  ALERT CARD at check-in to the Emergency Department and triage nurse.  Should you have questions after your visit or need to cancel or reschedule your appointment, please contact [facility] - A DEPT OF [facility]  Dept: [phone]  and follow the prompts.  Office hours are 8:00 a.m. to 4:30 p.m. Monday - Friday. Please note that voicemails left after 4:00 p.m. may not be returned until the following business day.  We are closed weekends and major holidays. You have access to a nurse at all times for urgent questions. Please call the main number to the clinic Dept: [phone] and follow the prompts.   For any non-urgent questions, you may also contact your provider using MyChart. We now offer e-Visits for anyone 76 and older to request care online for non-urgent symptoms. For details visit [email or web address].   Also download the MyChart app! Go to the app store, search MyChart, open the app, select [facility], and log in with your MyChart username and password.

## 2024-09-10 ENCOUNTER — Other Ambulatory Visit: Payer: Self-pay

## 2024-09-10 ENCOUNTER — Other Ambulatory Visit: Payer: Self-pay | Admitting: Nurse Practitioner

## 2024-09-10 ENCOUNTER — Inpatient Hospital Stay

## 2024-09-10 DIAGNOSIS — C169 Malignant neoplasm of stomach, unspecified: Secondary | ICD-10-CM

## 2024-09-10 NOTE — Progress Notes (Signed)
 [facility] CSW Progress Note  Visual merchandiser spoke with patient's spouse over the phone. Patient's spouse discussed the medical updates related to treatment and the celebrations of progress of treatment. On the call, patient's spouse discussed grief support. Patient will reach out to CSW if interested in further services. CSW continues to be available if needed.    Lizbeth Sprague, LCSW Clinical Social Worker [facility]

## 2024-09-12 ENCOUNTER — Other Ambulatory Visit: Payer: Self-pay

## 2024-09-17 ENCOUNTER — Other Ambulatory Visit: Payer: Self-pay

## 2024-09-23 ENCOUNTER — Other Ambulatory Visit

## 2024-09-23 ENCOUNTER — Ambulatory Visit: Admitting: Nurse Practitioner

## 2024-09-23 ENCOUNTER — Ambulatory Visit

## 2024-09-24 ENCOUNTER — Other Ambulatory Visit: Payer: Self-pay | Admitting: Oncology

## 2024-09-30 ENCOUNTER — Inpatient Hospital Stay: Attending: Nurse Practitioner

## 2024-09-30 ENCOUNTER — Inpatient Hospital Stay (HOSPITAL_BASED_OUTPATIENT_CLINIC_OR_DEPARTMENT_OTHER): Admitting: Nurse Practitioner

## 2024-09-30 ENCOUNTER — Encounter: Payer: Self-pay | Admitting: Nurse Practitioner

## 2024-09-30 ENCOUNTER — Inpatient Hospital Stay

## 2024-09-30 VITALS — BP 124/78 | HR 63 | Temp 97.6°F | Resp 18 | Ht 72.0 in | Wt 192.7 lb

## 2024-09-30 VITALS — BP 122/70 | HR 52 | Temp 98.4°F | Resp 16

## 2024-09-30 DIAGNOSIS — C169 Malignant neoplasm of stomach, unspecified: Secondary | ICD-10-CM | POA: Diagnosis not present

## 2024-09-30 DIAGNOSIS — C786 Secondary malignant neoplasm of retroperitoneum and peritoneum: Secondary | ICD-10-CM | POA: Insufficient documentation

## 2024-09-30 DIAGNOSIS — Z5112 Encounter for antineoplastic immunotherapy: Secondary | ICD-10-CM | POA: Insufficient documentation

## 2024-09-30 DIAGNOSIS — C162 Malignant neoplasm of body of stomach: Secondary | ICD-10-CM | POA: Diagnosis present

## 2024-09-30 DIAGNOSIS — Z5111 Encounter for antineoplastic chemotherapy: Secondary | ICD-10-CM | POA: Diagnosis present

## 2024-09-30 LAB — CBC WITH DIFFERENTIAL (CANCER CENTER ONLY)
Abs Immature Granulocytes: 0.02 K/uL (ref 0.00–0.07)
Basophils Absolute: 0 K/uL (ref 0.0–0.1)
Basophils Relative: 0 %
Eosinophils Absolute: 0.4 K/uL (ref 0.0–0.5)
Eosinophils Relative: 5 %
HCT: 44.2 % (ref 39.0–52.0)
Hemoglobin: 15 g/dL (ref 13.0–17.0)
Immature Granulocytes: 0 %
Lymphocytes Relative: 18 %
Lymphs Abs: 1.4 K/uL (ref 0.7–4.0)
MCH: 31 pg (ref 26.0–34.0)
MCHC: 33.9 g/dL (ref 30.0–36.0)
MCV: 91.3 fL (ref 80.0–100.0)
Monocytes Absolute: 0.6 K/uL (ref 0.1–1.0)
Monocytes Relative: 8 %
Neutro Abs: 5.2 K/uL (ref 1.7–7.7)
Neutrophils Relative %: 69 %
Platelet Count: 210 K/uL (ref 150–400)
RBC: 4.84 MIL/uL (ref 4.22–5.81)
RDW: 12.9 % (ref 11.5–15.5)
WBC Count: 7.7 K/uL (ref 4.0–10.5)
nRBC: 0 % (ref 0.0–0.2)

## 2024-09-30 LAB — CMP (CANCER CENTER ONLY)
ALT: 27 U/L (ref 0–44)
AST: 26 U/L (ref 15–41)
Albumin: 4.5 g/dL (ref 3.5–5.0)
Alkaline Phosphatase: 122 U/L (ref 38–126)
Anion gap: 11 (ref 5–15)
BUN: 16 mg/dL (ref 6–20)
CO2: 25 mmol/L (ref 22–32)
Calcium: 9.8 mg/dL (ref 8.9–10.3)
Chloride: 103 mmol/L (ref 98–111)
Creatinine: 0.96 mg/dL (ref 0.61–1.24)
GFR, Estimated: >60 mL/min (ref >60)
Glucose, Bld: 105 mg/dL — ABNORMAL HIGH (ref 70–99)
Potassium: 3.8 mmol/L (ref 3.5–5.1)
Sodium: 139 mmol/L (ref 135–145)
Total Bilirubin: 0.4 mg/dL (ref 0.0–1.2)
Total Protein: 7.8 g/dL (ref 6.5–8.1)

## 2024-09-30 MED ORDER — OLANZAPINE 5 MG PO TABS
5.0000 mg | ORAL_TABLET | Freq: Once | ORAL | Status: AC
  Administered 2024-09-30: 5 mg via ORAL
  Filled 2024-09-30: qty 1

## 2024-09-30 MED ORDER — DEXAMETHASONE SOD PHOSPHATE PF 10 MG/ML IJ SOLN
10.0000 mg | Freq: Once | INTRAMUSCULAR | Status: AC
  Administered 2024-09-30: 10 mg via INTRAVENOUS

## 2024-09-30 MED ORDER — ZOLBETUXIMAB-CLZB CHEMO 100MG/5ML IV SOLN
387.5000 mg/m2 | Freq: Once | INTRAVENOUS | Status: AC
  Administered 2024-09-30: 800 mg via INTRAVENOUS
  Filled 2024-09-30: qty 30

## 2024-09-30 MED ORDER — SODIUM CHLORIDE 0.9 % IV SOLN
150.0000 mg | Freq: Once | INTRAVENOUS | Status: AC
  Administered 2024-09-30: 150 mg via INTRAVENOUS
  Filled 2024-09-30: qty 150

## 2024-09-30 MED ORDER — PALONOSETRON HCL INJECTION 0.25 MG/5ML
0.2500 mg | Freq: Once | INTRAVENOUS | Status: AC
  Administered 2024-09-30: 0.25 mg via INTRAVENOUS
  Filled 2024-09-30: qty 5

## 2024-09-30 MED ORDER — SODIUM CHLORIDE 0.9 % IV SOLN: INTRAVENOUS | Status: DC

## 2024-09-30 NOTE — Progress Notes (Signed)
 Patient seen by Olam Ned NP today  Vitals are within treatment parameters:Yes   Labs are within treatment parameters: Yes   Treatment plan has been signed: Yes   Per physician team, Patient is ready for treatment and there are NO modifications to the treatment plan.

## 2024-09-30 NOTE — Progress Notes (Signed)
 [facility] OFFICE PROGRESS NOTE   Diagnosis: Gastric cancer  INTERVAL HISTORY:   [facility] returns as scheduled.  He completed another cycle of zolbetuximab 09/09/2024.  He denies nausea/vomiting after treatment.  No mouth sores.  No diarrhea.  No rash.  No abdominal pain.  Stable numbness/tingling in the fingertips and toes.  No abdominal pain.  He continues to note fatigue for several days after each treatment.  He is still able to complete normal activities during this time, just notes he is more tired.  He has lost some weight since his last visit.  He attributes this to eliminating fatty/fried foods from his diet.  He notes that fried foods cause nausea/vomiting.  He continues to exercise.  Objective:  Vital signs in last 24 hours:  Blood pressure 124/78, pulse 63, temperature 97.6 F (36.4 C), temperature source Temporal, resp. rate 18, height 6' (1.829 m), weight 192 lb 11.2 oz (87.4 kg), SpO2 98%.    HEENT: No thrush or ulcers. Resp: Lungs clear bilaterally. Cardio: Regular rate and rhythm. GI: No hepatosplenomegaly.  No mass.  Nontender. Vascular: No leg edema. Neuro: Alert and oriented. Skin: No rash. Port-A-Cath without erythema.  Lab Results:  Lab Results  Component Value Date   WBC 7.7 09/30/2024   HGB 15.0 09/30/2024   HCT 44.2 09/30/2024   MCV 91.3 09/30/2024   PLT 210 09/30/2024   NEUTROABS 5.2 09/30/2024    Imaging:  No results found.  Medications: I have reviewed the patient's current medications.  Assessment/Plan: Gastric cancer 11/21/2023 EGD-gastric body with infiltrative looking lesion; biopsy shows involvement of a hypercellular lesion that suggests diffuse carcinoma by morphology CTs abdomen/pelvis 11/28/2023-diffuse fatty infiltration of the liver; gastric wall thickening; lymph nodes up to 6 mm in diameter near the stomach wall. 12/31/2023 upper endoscopy-large diffuse friable infiltrative polypoid and ulcerated  circumferential mass found in the gastric body.  Scope was passed beyond the mass with normal antrum/pylorus.  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  Negative for HER2 (1+); mismatch repair protein IHC normal; PD-L1 CPS 0%; CLDN18 positive: 90% of tumor cells with 2+/3+ membrane staining PET scan 01/07/2024-circumferential hypermetabolic gastric mucosal thickening.  Ill-defined hypermetabolic lymph nodes in the gastrohepatic ligament.  Intense hypermetabolic activity associated with the right lobe of the thyroid  gland.  Small hypermetabolic right axillary node favored reactive. 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  Gastric wall thickening slightly improved. Cycle 7 oxaliplatin , zolbetuximab 05/05/2024, Fulphila  Cycle 8 zolbetuximab 05/20/2024, oxaliplatin  held due to neuropathy, no Fulphila  Cycle 9 oxaliplatin /zolbetuximab 06/03/2024, Fulphila  Cycle 10 oxaliplatin /zolbetuximab 06/17/2024, Fulphila  Cycle 11 oxaliplatin /zolbetuximab 06/30/2024, Fulphila , oxaliplatin  dose reduced Cycle 12 zolbetuximab 07/15/2024, oxaliplatin  held secondary to neuropathy Cycle 13 zolbetuximab 07/29/2024, oxaliplatin  held secondary to neuropathy Cycle 14 zolbetuximab 08/26/2024, oxaliplatin  held secondary to neuropathy 09/01/2024 CTs-mild distal gastric wall  thickening without discrete mass.  No metastatic disease. Cycle 15 zolbetuximab 09/09/2024, oxaliplatin  held due to neuropathy Cycle 16 zolbetuximab 09/30/2024, oxaliplatin  held due to neuropathy Early satiety, postprandial abdominal pain, weight loss secondary to #1 History of bilateral  lower extremity DVT 2021-anticoagulation discontinued due to massive retroperitoneal bleed, IVC filter placed 08/24/2020 Hospitalized with acute respiratory failure due to COVID-19 08/06/2020 - 09/04/2020 History of massive retroperitoneal bleed secondary to anticoagulation September 2021 Thyroid  ultrasound 01/13/2024-no abnormal nodule identified in the right lobe.  1.1 cm thyroid  isthmus nodule meets criteria for 1 year follow-up ultrasound. Admission 02/01/2024 with increased abdominal pain/ascites Hospitalization with altered mental status 02/05/2024 through 02/13/2024; question rare case of 5-FU psychosis.  Improved 02/16/2024.  Mental status at baseline 02/23/2024. Neutropenia 02/16/2024 Mucositis 02/16/2024.  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  Persistent cold sensitivity 05/20/2024, oxaliplatin  held.  Cold sensitivity resolved 06/03/2024, oxaliplatin  resumed.  Mild loss of vibratory sense, oxaliplatin  dose reduced 06/29/2024  Disposition: Henry May appears stable.  He continues zolbetuximab, now on a 3-week schedule.  He continues to tolerate well.  There is no clinical evidence of disease progression.  Plan to proceed with zolbetuximab today as scheduled.  He has lost some weight.  I reviewed with the [facility] pharmacist.  No dose reduction needed.    CBC and chemistry panel reviewed.  Labs adequate for treatment.  He attributes the recent weight loss to adjusting his diet.  Referral to [facility] nutrition placed.  He will try  nutritional supplements for extra calories.    He will return for follow-up and treatment in 3 weeks.  We are available to see him sooner if needed.    Olam Ned ANP/GNP-BC   09/30/2024  8:48 AM

## 2024-09-30 NOTE — Patient Instructions (Signed)
 [facility] - A DEPT OF [facility]  Discharge Instructions: Thank you for choosing [facility] to provide your oncology and hematology care.   If you have a lab appointment with the Cancer Center, please go directly to the Cancer Center and check in at the registration area.   Wear comfortable clothing and clothing appropriate for easy access to any Portacath or PICC line.   We strive to give you quality time with your provider. You may need to reschedule your appointment if you arrive late (15 or more minutes).  Arriving late affects you and other patients whose appointments are after yours.  Also, if you miss three or more appointments without notifying the office, you may be dismissed from the clinic at the provider's discretion.      For prescription refill requests, have your pharmacy contact our office and allow 72 hours for refills to be completed.    Today you received the following chemotherapy and/or immunotherapy agents: vyloy       To help prevent nausea and vomiting after your treatment, we encourage you to take your nausea medication as directed.  BELOW ARE SYMPTOMS THAT SHOULD BE REPORTED IMMEDIATELY: *FEVER GREATER THAN 100.4 F (38 C) OR HIGHER *CHILLS OR SWEATING *NAUSEA AND VOMITING THAT IS NOT CONTROLLED WITH YOUR NAUSEA MEDICATION *UNUSUAL SHORTNESS OF BREATH *UNUSUAL BRUISING OR BLEEDING *URINARY PROBLEMS (pain or burning when urinating, or frequent urination) *BOWEL PROBLEMS (unusual diarrhea, constipation, pain near the anus) TENDERNESS IN MOUTH AND THROAT WITH OR WITHOUT PRESENCE OF ULCERS (sore throat, sores in mouth, or a toothache) UNUSUAL RASH, SWELLING OR PAIN  UNUSUAL VAGINAL DISCHARGE OR ITCHING   Items with * indicate a potential emergency and should be followed up as soon as possible or go to the Emergency Department if any problems should occur.  Please show the CHEMOTHERAPY ALERT CARD or IMMUNOTHERAPY  ALERT CARD at check-in to the Emergency Department and triage nurse.  Should you have questions after your visit or need to cancel or reschedule your appointment, please contact [facility] - A DEPT OF [facility]  Dept: [phone]  and follow the prompts.  Office hours are 8:00 a.m. to 4:30 p.m. Monday - Friday. Please note that voicemails left after 4:00 p.m. may not be returned until the following business day.  We are closed weekends and major holidays. You have access to a nurse at all times for urgent questions. Please call the main number to the clinic Dept: [phone] and follow the prompts.   For any non-urgent questions, you may also contact your provider using MyChart. We now offer e-Visits for anyone 76 and older to request care online for non-urgent symptoms. For details visit [email or web address].   Also download the MyChart app! Go to the app store, search MyChart, open the app, select [facility], and log in with your MyChart username and password.

## 2024-10-04 ENCOUNTER — Inpatient Hospital Stay: Admitting: Nutrition

## 2024-10-04 NOTE — Progress Notes (Signed)
 Telephone follow up completed with patient's wife. Patient was tired and was asleep.  Receives zolbetuximab every 3 weeks with no evidence of disease progression. He is followed by Dr. Cloretta.  Weight: 192 pounds 11.2 oz on October 16 203 pounds 12.8 oz September 25  Labs include Glucose 110  Wife reports patient does not tolerate the foods he used to eat. He has been throwing up after fried foods and juice smoothies such as Guava juice or homemade smoothie made with cucumber and celery. He does not throw about when he is taking antiemetics around his chemotherapy treatments. He does not take any nausea medications now and he stopped Omeprazole  when heartburn resolved. He complains of bloating after eating. He has increased fatigue. He denies feeling nauseated but routinely vomits after eating. He cannot drink any ONS.  Nutrition Diagnosis: Unintended weight loss ongoing  Intervention: Avoid fried foods. Follow bland, low fat, lower fiber diet. Provided instructions. Resume Ondansetron  and Omeprazole  as ordered and monitor results. Wife agreeable to try and repeated back instructions following MD orders.  Monitoring, Evaluation, Goals: Once vomiting and increased gas improve/resolve, will work to increase calories and protein to promote weight stability.  Next Visit: Will follow up as needed.  Patient has contact information and will call to follow up.

## 2024-10-06 ENCOUNTER — Inpatient Hospital Stay

## 2024-10-06 NOTE — Progress Notes (Signed)
 [facility] CSW Progress Note  Visual merchandiser met with patient's spouse on this date for support.    Lizbeth Sprague, LCSW Clinical Social Worker [facility]

## 2024-10-08 ENCOUNTER — Telehealth: Payer: Self-pay | Admitting: *Deleted

## 2024-10-08 DIAGNOSIS — C169 Malignant neoplasm of stomach, unspecified: Secondary | ICD-10-CM

## 2024-10-08 MED ORDER — PANTOPRAZOLE SODIUM 40 MG PO TBEC: 40.0000 mg | DELAYED_RELEASE_TABLET | Freq: Every day | ORAL | 1 refills | Status: DC

## 2024-10-08 NOTE — Telephone Encounter (Signed)
 Call from Mrs. Molzahn that husband is still having a lot of reflux and burping up gastric contents despite omeprazole  20 mg daily. Per Dr. Cloretta, try pantoprazole  40 mg daily. OK also to take Mylanta prn, but separate 2 hours from pantoprazole  dose. Avoid spicy heavy foods and eat small amounts and be sure he is sitting up for at least 1 hour after eating.

## 2024-10-15 ENCOUNTER — Other Ambulatory Visit: Payer: Self-pay | Admitting: Oncology

## 2024-10-18 ENCOUNTER — Encounter: Payer: Self-pay | Admitting: Radiology

## 2024-10-18 ENCOUNTER — Inpatient Hospital Stay: Attending: Nurse Practitioner | Admitting: Nutrition

## 2024-10-18 DIAGNOSIS — C162 Malignant neoplasm of body of stomach: Secondary | ICD-10-CM | POA: Insufficient documentation

## 2024-10-18 DIAGNOSIS — Z5111 Encounter for antineoplastic chemotherapy: Secondary | ICD-10-CM | POA: Insufficient documentation

## 2024-10-18 DIAGNOSIS — C786 Secondary malignant neoplasm of retroperitoneum and peritoneum: Secondary | ICD-10-CM | POA: Insufficient documentation

## 2024-10-18 NOTE — Progress Notes (Signed)
 Patient did not show up for nutrition appointment.   Noted referral was for diet education on GERD diet. This patient was educated on GERD diet on October 20 at last nutrition appointment.

## 2024-10-20 ENCOUNTER — Inpatient Hospital Stay

## 2024-10-20 NOTE — Progress Notes (Signed)
 [facility] CSW Progress Note  Visual Merchandiser met with spouse for ongoing support.      Lizbeth Sprague, LCSW Clinical Social Worker [facility]

## 2024-10-21 ENCOUNTER — Inpatient Hospital Stay

## 2024-10-21 ENCOUNTER — Inpatient Hospital Stay: Admitting: Oncology

## 2024-10-21 ENCOUNTER — Other Ambulatory Visit: Payer: Self-pay | Admitting: *Deleted

## 2024-10-21 VITALS — BP 110/75 | HR 63 | Temp 97.6°F | Resp 18 | Ht 72.0 in | Wt 185.2 lb

## 2024-10-21 VITALS — BP 135/85 | HR 54 | Temp 98.3°F | Resp 18

## 2024-10-21 DIAGNOSIS — C169 Malignant neoplasm of stomach, unspecified: Secondary | ICD-10-CM

## 2024-10-21 DIAGNOSIS — C162 Malignant neoplasm of body of stomach: Secondary | ICD-10-CM

## 2024-10-21 LAB — CBC WITH DIFFERENTIAL (CANCER CENTER ONLY)
Abs Immature Granulocytes: 0.02 K/uL (ref 0.00–0.07)
Basophils Absolute: 0 K/uL (ref 0.0–0.1)
Basophils Relative: 0 %
Eosinophils Absolute: 0.6 K/uL — ABNORMAL HIGH (ref 0.0–0.5)
Eosinophils Relative: 8 %
HCT: 43.6 % (ref 39.0–52.0)
Hemoglobin: 14.8 g/dL (ref 13.0–17.0)
Immature Granulocytes: 0 %
Lymphocytes Relative: 18 %
Lymphs Abs: 1.4 K/uL (ref 0.7–4.0)
MCH: 31.2 pg (ref 26.0–34.0)
MCHC: 33.9 g/dL (ref 30.0–36.0)
MCV: 92 fL (ref 80.0–100.0)
Monocytes Absolute: 0.5 K/uL (ref 0.1–1.0)
Monocytes Relative: 7 %
Neutro Abs: 5.2 K/uL (ref 1.7–7.7)
Neutrophils Relative %: 67 %
Platelet Count: 239 K/uL (ref 150–400)
RBC: 4.74 MIL/uL (ref 4.22–5.81)
RDW: 13 % (ref 11.5–15.5)
WBC Count: 7.8 K/uL (ref 4.0–10.5)
nRBC: 0 % (ref 0.0–0.2)

## 2024-10-21 LAB — CMP (CANCER CENTER ONLY)
ALT: 23 U/L (ref 0–44)
AST: 21 U/L (ref 15–41)
Albumin: 4.2 g/dL (ref 3.5–5.0)
Alkaline Phosphatase: 100 U/L (ref 38–126)
Anion gap: 10 (ref 5–15)
BUN: 12 mg/dL (ref 6–20)
CO2: 26 mmol/L (ref 22–32)
Calcium: 9.8 mg/dL (ref 8.9–10.3)
Chloride: 103 mmol/L (ref 98–111)
Creatinine: 1.05 mg/dL (ref 0.61–1.24)
GFR, Estimated: >60 mL/min (ref >60)
Glucose, Bld: 90 mg/dL (ref 70–99)
Potassium: 3.9 mmol/L (ref 3.5–5.1)
Sodium: 139 mmol/L (ref 135–145)
Total Bilirubin: 0.3 mg/dL (ref 0.0–1.2)
Total Protein: 7.2 g/dL (ref 6.5–8.1)

## 2024-10-21 MED ORDER — SODIUM CHLORIDE 0.9 % IV SOLN
150.0000 mg | Freq: Once | INTRAVENOUS | Status: AC
  Administered 2024-10-21: 150 mg via INTRAVENOUS
  Filled 2024-10-21: qty 150

## 2024-10-21 MED ORDER — SODIUM CHLORIDE 0.9 % IV SOLN: INTRAVENOUS | Status: DC

## 2024-10-21 MED ORDER — PROCHLORPERAZINE MALEATE 10 MG PO TABS
10.0000 mg | ORAL_TABLET | Freq: Once | ORAL | Status: AC
  Administered 2024-10-21: 10 mg via ORAL
  Filled 2024-10-21: qty 1

## 2024-10-21 MED ORDER — DEXAMETHASONE 4 MG PO TABS: 4.0000 mg | ORAL_TABLET | Freq: Every day | ORAL | 2 refills | Status: DC

## 2024-10-21 MED ORDER — SCOPOLAMINE 1 MG/3DAYS TD PT72: 1.0000 | MEDICATED_PATCH | TRANSDERMAL | 2 refills | Status: DC

## 2024-10-21 MED ORDER — PROCHLORPERAZINE MALEATE 10 MG PO TABS: 5.0000 mg | ORAL_TABLET | Freq: Once | ORAL | Status: DC

## 2024-10-21 MED ORDER — PALONOSETRON HCL INJECTION 0.25 MG/5ML
0.2500 mg | Freq: Once | INTRAVENOUS | Status: AC
  Administered 2024-10-21: 0.25 mg via INTRAVENOUS
  Filled 2024-10-21: qty 5

## 2024-10-21 MED ORDER — ZOLBETUXIMAB-CLZB CHEMO 100MG/5ML IV SOLN
387.5000 mg/m2 | Freq: Once | INTRAVENOUS | Status: AC
  Administered 2024-10-21: 800 mg via INTRAVENOUS
  Filled 2024-10-21: qty 30

## 2024-10-21 MED ORDER — PANTOPRAZOLE SODIUM 40 MG PO TBEC: 40.0000 mg | DELAYED_RELEASE_TABLET | Freq: Two times a day (BID) | ORAL | 2 refills | Status: DC

## 2024-10-21 MED ORDER — OLANZAPINE 5 MG PO TABS
5.0000 mg | ORAL_TABLET | Freq: Once | ORAL | Status: AC
  Administered 2024-10-21: 5 mg via ORAL
  Filled 2024-10-21: qty 1

## 2024-10-21 MED ORDER — DEXAMETHASONE SOD PHOSPHATE PF 10 MG/ML IJ SOLN
10.0000 mg | Freq: Once | INTRAMUSCULAR | Status: AC
  Administered 2024-10-21: 10 mg via INTRAVENOUS

## 2024-10-21 NOTE — Progress Notes (Signed)
 Patient seen by Dr. Arley Hof today  Vitals are within treatment parameters:Yes   Labs are within treatment parameters: Yes No dose reduction for weight loss  Treatment plan has been signed: Yes   Per physician team, Patient is ready for treatment and there are NO modifications to the treatment plan.

## 2024-10-21 NOTE — Patient Instructions (Signed)
 [facility] - A DEPT OF [facility]  Discharge Instructions: Thank you for choosing [facility] to provide your oncology and hematology care.   If you have a lab appointment with the Cancer Center, please go directly to the Cancer Center and check in at the registration area.   Wear comfortable clothing and clothing appropriate for easy access to any Portacath or PICC line.   We strive to give you quality time with your provider. You may need to reschedule your appointment if you arrive late (15 or more minutes).  Arriving late affects you and other patients whose appointments are after yours.  Also, if you miss three or more appointments without notifying the office, you may be dismissed from the clinic at the provider's discretion.      For prescription refill requests, have your pharmacy contact our office and allow 72 hours for refills to be completed.    Today you received the following chemotherapy and/or immunotherapy agents: vyloy       To help prevent nausea and vomiting after your treatment, we encourage you to take your nausea medication as directed.  BELOW ARE SYMPTOMS THAT SHOULD BE REPORTED IMMEDIATELY: *FEVER GREATER THAN 100.4 F (38 C) OR HIGHER *CHILLS OR SWEATING *NAUSEA AND VOMITING THAT IS NOT CONTROLLED WITH YOUR NAUSEA MEDICATION *UNUSUAL SHORTNESS OF BREATH *UNUSUAL BRUISING OR BLEEDING *URINARY PROBLEMS (pain or burning when urinating, or frequent urination) *BOWEL PROBLEMS (unusual diarrhea, constipation, pain near the anus) TENDERNESS IN MOUTH AND THROAT WITH OR WITHOUT PRESENCE OF ULCERS (sore throat, sores in mouth, or a toothache) UNUSUAL RASH, SWELLING OR PAIN  UNUSUAL VAGINAL DISCHARGE OR ITCHING   Items with * indicate a potential emergency and should be followed up as soon as possible or go to the Emergency Department if any problems should occur.  Please show the CHEMOTHERAPY ALERT CARD or IMMUNOTHERAPY  ALERT CARD at check-in to the Emergency Department and triage nurse.  Should you have questions after your visit or need to cancel or reschedule your appointment, please contact [facility] - A DEPT OF [facility]  Dept: [phone]  and follow the prompts.  Office hours are 8:00 a.m. to 4:30 p.m. Monday - Friday. Please note that voicemails left after 4:00 p.m. may not be returned until the following business day.  We are closed weekends and major holidays. You have access to a nurse at all times for urgent questions. Please call the main number to the clinic Dept: [phone] and follow the prompts.   For any non-urgent questions, you may also contact your provider using MyChart. We now offer e-Visits for anyone 76 and older to request care online for non-urgent symptoms. For details visit [email or web address].   Also download the MyChart app! Go to the app store, search MyChart, open the app, select [facility], and log in with your MyChart username and password.

## 2024-10-21 NOTE — Progress Notes (Signed)
 [facility] OFFICE PROGRESS NOTE   Diagnosis: Gastric cancer  INTERVAL HISTORY:   Henry May completed another treatment with zolbetuximab on 09/30/2024.  No nausea following the zolbetuximab.  He reports reflux symptoms after eating.  He vomits after eating certain foods.  He is tolerating Ramen noodles and liquids.  He reports a good appetite, but is losing weight.  No difficulty with bowel or bladder function.  No bleeding. He is exercising.  He reports stable neuropathy symptoms.  Compazine  helps nausea. Objective:  Vital signs in last 24 hours:  Blood pressure 110/75, pulse 63, temperature 97.6 F (36.4 C), temperature source Temporal, resp. rate 18, height 6' (1.829 m), weight 185 lb 3.2 oz (84 kg), SpO2 100%.    HEENT: No thrush or ulcers Resp: Lungs clear bilaterally Cardio: Regular rate and rhythm GI: Nondistended, no mass, no hepatosplenomegaly Vascular: No leg edema  Portacath/PICC-without erythema  Lab Results:  Lab Results  Component Value Date   WBC 7.8 10/21/2024   HGB 14.8 10/21/2024   HCT 43.6 10/21/2024   MCV 92.0 10/21/2024   PLT 239 10/21/2024   NEUTROABS 5.2 10/21/2024    CMP  Lab Results  Component Value Date   NA 139 09/30/2024   K 3.8 09/30/2024   CL 103 09/30/2024   CO2 25 09/30/2024   GLUCOSE 105 (H) 09/30/2024   BUN 16 09/30/2024   CREATININE 0.96 09/30/2024   CALCIUM  9.8 09/30/2024   PROT 7.8 09/30/2024   ALBUMIN  4.5 09/30/2024   AST 26 09/30/2024   ALT 27 09/30/2024   ALKPHOS 122 09/30/2024   BILITOT 0.4 09/30/2024   GFRNONAA >60 09/30/2024   GFRAA >60 09/03/2020    Lab Results  Component Value Date   CEA 3.57 01/15/2024    Medications: I have reviewed the patient's current medications.   Assessment/Plan: Gastric cancer 11/21/2023 EGD-gastric body with infiltrative looking lesion; biopsy shows involvement of a hypercellular lesion that suggests diffuse carcinoma by morphology CTs abdomen/pelvis  11/28/2023-diffuse fatty infiltration of the liver; gastric wall thickening; lymph nodes up to 6 mm in diameter near the stomach wall. 12/31/2023 upper endoscopy-large diffuse friable infiltrative polypoid and ulcerated circumferential mass found in the gastric body.  Scope was passed beyond the mass with normal antrum/pylorus.  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  Negative for HER2 (1+); mismatch repair protein IHC normal; PD-L1 CPS 0%; CLDN18 positive: 90% of tumor cells with 2+/3+ membrane staining PET scan 01/07/2024-circumferential hypermetabolic gastric mucosal thickening.  Ill-defined hypermetabolic lymph nodes in the gastrohepatic ligament.  Intense hypermetabolic activity associated with the right lobe of the thyroid  gland.  Small hypermetabolic right axillary node favored reactive. 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  Gastric wall thickening slightly improved. Cycle 7 oxaliplatin , zolbetuximab 05/05/2024, Fulphila  Cycle 8 zolbetuximab 05/20/2024, oxaliplatin  held due to neuropathy, no Fulphila  Cycle 9 oxaliplatin /zolbetuximab 06/03/2024, Fulphila  Cycle 10 oxaliplatin /zolbetuximab 06/17/2024, Fulphila  Cycle 11 oxaliplatin /zolbetuximab 06/30/2024, Fulphila , oxaliplatin  dose reduced Cycle 12 zolbetuximab 07/15/2024, oxaliplatin   held secondary to neuropathy Cycle 13 zolbetuximab 07/29/2024, oxaliplatin  held secondary to neuropathy  Cycle 14 zolbetuximab 08/26/2024, oxaliplatin  held secondary to neuropathy 09/01/2024 CTs-mild distal gastric wall thickening without discrete mass.  No metastatic disease. Cycle 15 zolbetuximab 09/09/2024, oxaliplatin  held due to neuropathy Cycle 16 zolbetuximab 09/30/2024, oxaliplatin  held due to neuropathy Cycle 17 zolbetuximab 10/21/2024, oxaliplatin  held due to neuropathy Early satiety, postprandial abdominal pain, weight loss secondary to #1 History of bilateral lower extremity DVT 2021-anticoagulation discontinued due to massive retroperitoneal bleed, IVC filter placed 08/24/2020 Hospitalized with acute respiratory failure due to COVID-19 08/06/2020 - 09/04/2020 History of massive retroperitoneal bleed secondary to anticoagulation September 2021 Thyroid  ultrasound 01/13/2024-no abnormal nodule identified in the right lobe.  1.1 cm thyroid  isthmus nodule meets criteria for 1 year follow-up ultrasound. Admission 02/01/2024 with increased abdominal pain/ascites Hospitalization with altered mental status 02/05/2024 through 02/13/2024; question rare case of 5-FU psychosis.  Improved 02/16/2024.  Mental status at baseline 02/23/2024. Neutropenia 02/16/2024 Mucositis 02/16/2024.  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  Persistent cold sensitivity 05/20/2024, oxaliplatin  held.  Cold sensitivity resolved 06/03/2024, oxaliplatin  resumed.  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  He is currently being treated with single agent zolbetuximab.  He has developed increased reflux symptoms and intermittent nausea/vomiting.  I am concerned his symptoms are related to  progressive tumor in the stomach.  He will try Maalox and increase the Protonix  to twice daily.  We will asked the Cancer center nutritionist to contact him.  He will follow a diet that he tolerates.  He will call for increased nausea.  Henry May will return for an office visit in zolbetuximab in 3 weeks.  His wife will contact us  as needed in the interim.  Arley Hof, MD  10/21/2024  8:21 AM

## 2024-10-22 ENCOUNTER — Other Ambulatory Visit: Payer: Self-pay

## 2024-10-22 ENCOUNTER — Telehealth: Payer: Self-pay | Admitting: Oncology

## 2024-10-25 ENCOUNTER — Other Ambulatory Visit: Payer: Self-pay | Admitting: *Deleted

## 2024-10-25 ENCOUNTER — Inpatient Hospital Stay: Admitting: Nutrition

## 2024-10-25 DIAGNOSIS — C162 Malignant neoplasm of body of stomach: Secondary | ICD-10-CM

## 2024-10-25 NOTE — Progress Notes (Signed)
 Nutrition follow up completed with patient and wife in office.  Weight decreased: 185.5 pounds November 10 192 pounds 11.2 oz October 16 202.1 pounds September 11 202.6 pounds August 14  8 % loss over 3 months which is not clinically significant but is concerning.  Labs on Nov 6 reviewed  Medications include aluminum-magnesium hydroxide, decadron , Zyprexa , Zofran , Protonix , Compazine   Patient reports increased abdominal pain, fatigue, nausea with vomiting and neuropathy. He is taking Protonix  twice daily and this has eliminated vomiting but he is still having nausea. He is frustrated with neuropathy. He does not understand why his symptoms are worse now than when he was having earlier treatments.   Diet recall indicated patient avoids spicy and acidic foods. He has had occasional chocolate. He drinks regular coffee every day. He prefers 2% milk over skim milk. Usually has a potato soup  with bread in the morning for breakfast, chicken soup or Ramen noodles for lunch, Melon for a snack. He has started to drink Ensure but does not know which kind.  Questions about medications and scan.  Nutrition Diagnosis: Unintended weight loss continues  Intervention: Educated on diet for GERD. Enforced importance of avoiding chocolate, caffeinated and decaffeinate beverages, mint or peppermint, and high fat foods including 2% milk. Provided alternative options for these foods. Try Mallie Pinion Peptide, 4 oz 2 times daily. Nursing clarifies that patient can take Aluminum-Magnesium Hydroxide 4 times daily. MD has agreed to scan before next treatment.  Monitoring, Evaluation, Goals: Increase calories and protein to minimize weight loss.  Next Visit: Encouraged patient to contact RD by telephone as needed or schedule follow up as needed.  To be scheduled with upcoming treatment as needed.

## 2024-10-27 ENCOUNTER — Inpatient Hospital Stay

## 2024-10-27 NOTE — Progress Notes (Signed)
 [facility] CSW Progress Note  Visual Merchandiser met with patient's spouse as scheduled for support,.   Lizbeth Sprague, LCSW Clinical Social Worker [facility]

## 2024-11-01 ENCOUNTER — Encounter (HOSPITAL_BASED_OUTPATIENT_CLINIC_OR_DEPARTMENT_OTHER): Payer: Self-pay

## 2024-11-02 ENCOUNTER — Inpatient Hospital Stay

## 2024-11-02 ENCOUNTER — Ambulatory Visit (HOSPITAL_BASED_OUTPATIENT_CLINIC_OR_DEPARTMENT_OTHER)
Admission: RE | Admit: 2024-11-02 | Discharge: 2024-11-02 | Disposition: A | Discharge status: Home / Self Care | Source: Ambulatory Visit | Attending: Oncology | Admitting: Oncology

## 2024-11-02 DIAGNOSIS — C162 Malignant neoplasm of body of stomach: Secondary | ICD-10-CM | POA: Insufficient documentation

## 2024-11-02 MED ORDER — IOHEXOL 300 MG/ML  SOLN
100.0000 mL | Freq: Once | INTRAMUSCULAR | Status: AC | PRN
  Administered 2024-11-02: 100 mL via INTRAVENOUS

## 2024-11-02 MED ORDER — HEPARIN SOD (PORK) LOCK FLUSH 100 UNIT/ML IV SOLN
500.0000 [IU] | Freq: Once | INTRAVENOUS | Status: AC
  Administered 2024-11-02: 500 [IU] via INTRAVENOUS

## 2024-11-02 NOTE — Patient Instructions (Signed)

## 2024-11-02 NOTE — Progress Notes (Signed)
 Port accessed for CT scan

## 2024-11-03 ENCOUNTER — Inpatient Hospital Stay

## 2024-11-03 NOTE — Progress Notes (Signed)
 [facility] CSW Progress Note  Visual Merchandiser met with patient's spouse for spousal support.     Lizbeth Sprague, LCSW Clinical Social Worker [facility]

## 2024-11-04 ENCOUNTER — Encounter (HOSPITAL_BASED_OUTPATIENT_CLINIC_OR_DEPARTMENT_OTHER): Payer: Self-pay | Admitting: Emergency Medicine

## 2024-11-04 ENCOUNTER — Emergency Department (HOSPITAL_BASED_OUTPATIENT_CLINIC_OR_DEPARTMENT_OTHER)
Admission: EM | Admit: 2024-11-04 | Discharge: 2024-11-04 | Disposition: A | Discharge status: Home / Self Care | Source: Ambulatory Visit | Attending: Emergency Medicine | Admitting: Emergency Medicine

## 2024-11-04 ENCOUNTER — Other Ambulatory Visit: Payer: Self-pay

## 2024-11-04 ENCOUNTER — Encounter: Payer: Self-pay | Admitting: *Deleted

## 2024-11-04 DIAGNOSIS — Z85020 Personal history of malignant carcinoid tumor of stomach: Secondary | ICD-10-CM | POA: Diagnosis not present

## 2024-11-04 DIAGNOSIS — R112 Nausea with vomiting, unspecified: Secondary | ICD-10-CM | POA: Insufficient documentation

## 2024-11-04 DIAGNOSIS — R1013 Epigastric pain: Secondary | ICD-10-CM | POA: Insufficient documentation

## 2024-11-04 LAB — COMPREHENSIVE METABOLIC PANEL WITH GFR
ALT: 42 U/L (ref 0–44)
AST: 26 U/L (ref 15–41)
Albumin: 4 g/dL (ref 3.5–5.0)
Alkaline Phosphatase: 98 U/L (ref 38–126)
Anion gap: 12 (ref 5–15)
BUN: 14 mg/dL (ref 6–20)
CO2: 26 mmol/L (ref 22–32)
Calcium: 9.3 mg/dL (ref 8.9–10.3)
Chloride: 101 mmol/L (ref 98–111)
Creatinine, Ser: 0.91 mg/dL (ref 0.61–1.24)
GFR, Estimated: >60 mL/min (ref >60)
Glucose, Bld: 93 mg/dL (ref 70–99)
Potassium: 3.9 mmol/L (ref 3.5–5.1)
Sodium: 139 mmol/L (ref 135–145)
Total Bilirubin: 0.3 mg/dL (ref 0.0–1.2)
Total Protein: 6.8 g/dL (ref 6.5–8.1)

## 2024-11-04 LAB — URINALYSIS, ROUTINE W REFLEX MICROSCOPIC
Bilirubin Urine: NEGATIVE
Glucose, UA: NEGATIVE mg/dL
Hgb urine dipstick: NEGATIVE
Ketones, ur: 15 mg/dL — AB
Leukocytes,Ua: NEGATIVE
Nitrite: NEGATIVE
Specific Gravity, Urine: 1.025 (ref 1.005–1.030)
pH: 5.5 (ref 5.0–8.0)

## 2024-11-04 LAB — CBC WITH DIFFERENTIAL/PLATELET
Abs Immature Granulocytes: 0.03 K/uL (ref 0.00–0.07)
Basophils Absolute: 0 K/uL (ref 0.0–0.1)
Basophils Relative: 0 %
Eosinophils Absolute: 0.3 K/uL (ref 0.0–0.5)
Eosinophils Relative: 3 %
HCT: 40 % (ref 39.0–52.0)
Hemoglobin: 13.6 g/dL (ref 13.0–17.0)
Immature Granulocytes: 0 %
Lymphocytes Relative: 14 %
Lymphs Abs: 1.3 K/uL (ref 0.7–4.0)
MCH: 30.6 pg (ref 26.0–34.0)
MCHC: 34 g/dL (ref 30.0–36.0)
MCV: 89.9 fL (ref 80.0–100.0)
Monocytes Absolute: 0.5 K/uL (ref 0.1–1.0)
Monocytes Relative: 6 %
Neutro Abs: 7.4 K/uL (ref 1.7–7.7)
Neutrophils Relative %: 77 %
Platelets: 265 K/uL (ref 150–400)
RBC: 4.45 MIL/uL (ref 4.22–5.81)
RDW: 12.9 % (ref 11.5–15.5)
WBC: 9.6 K/uL (ref 4.0–10.5)
nRBC: 0 % (ref 0.0–0.2)

## 2024-11-04 LAB — LIPASE, BLOOD: Lipase: 35 U/L (ref 11–51)

## 2024-11-04 MED ORDER — METOCLOPRAMIDE HCL 5 MG/ML IJ SOLN
10.0000 mg | Freq: Once | INTRAMUSCULAR | Status: AC
  Administered 2024-11-04: 10 mg via INTRAVENOUS
  Filled 2024-11-04: qty 2

## 2024-11-04 MED ORDER — PROCHLORPERAZINE 25 MG RE SUPP: 25.0000 mg | Freq: Two times a day (BID) | RECTAL | 0 refills | Status: DC | PRN

## 2024-11-04 MED ORDER — LACTATED RINGERS IV BOLUS
1000.0000 mL | Freq: Once | INTRAVENOUS | Status: AC
  Administered 2024-11-04: 1000 mL via INTRAVENOUS

## 2024-11-04 MED ORDER — HEPARIN SOD (PORK) LOCK FLUSH 100 UNIT/ML IV SOLN
500.0000 [IU] | Freq: Once | INTRAVENOUS | Status: AC
  Administered 2024-11-04: 500 [IU]
  Filled 2024-11-04: qty 5

## 2024-11-04 MED ORDER — METOCLOPRAMIDE HCL 10 MG PO TABS: 10.0000 mg | ORAL_TABLET | Freq: Four times a day (QID) | ORAL | 0 refills | Status: DC

## 2024-11-04 NOTE — Discharge Instructions (Addendum)
 May try Reglan  every 6 hours as needed for nausea/vomiting.  Do not take other nausea medications at the same time.  If this does not work, may try Compazine  suppositories.  Stay hydrated and drink plenty of fluids.  Keep diet bland.  Please call family med office or oncology office to speak with nurse on-call about potential H. pylori testing.  Please follow-up with oncology as scheduled next week.  Return to ED sooner if any symptoms worsen including new fevers, severe uncontrollable abdominal pain, uncontrollable nausea/vomiting/diarrhea.

## 2024-11-04 NOTE — ED Triage Notes (Signed)
 Chemo- dr deanne  Vomiting and nausea , not eating, weak- x 2 weeks  CT scan yesterday Taking Mylanta and pantoprazole 

## 2024-11-04 NOTE — ED Provider Notes (Signed)
 [place] EMERGENCY DEPARTMENT AT [facility] Provider Note   CSN: [id number] Arrival date & time: 11/04/24  1516     Patient presents with: Emesis   Henry May is a 58 y.o. male.   Patient is a 58 year old male with a history of current gastric cancer who presents to the ED for increasing nausea/vomiting for the past 2 weeks.  Patient states he feels very unwell and weak.  He states he has not been able to eat and can tolerate very little.  He is followed by Dr. Cloretta with oncology.  His wife notes that he had a CT scan yesterday.  She states she talked to the nurse earlier today and was told he may have some inflammation but is unsure exactly what that means.  She noted that Dr. Cloretta is out of the office until next week and patient needs some relief in the meantime.  Patient does have an appointment with oncologist 11/25 next week.  He states he was given Mylanta and Protonix  for the symptoms but they have not improved.  He also takes Zofran  for nausea with minimal improvement.  States he has chemotherapy treatments every 3 weeks and the next 1 is scheduled for next week.  He notes that his nausea/vomiting is not worse after the chemo treatment and the symptoms began several days after the treatments.  Denies headaches, fever/chills, chest pain, shortness of breath, diarrhea.  No further complaints.   Emesis Associated symptoms: abdominal pain   Associated symptoms: no chills, no diarrhea, no fever and no headaches        Prior to Admission medications   Medication Sig Start Date End Date Taking? Authorizing Provider  metoCLOPramide  (REGLAN ) 10 MG tablet Take 1 tablet (10 mg total) by mouth every 6 (six) hours. 11/04/24  Yes Kaida Games, Thersia RAMAN, PA-C  prochlorperazine  (COMPAZINE ) 25 MG suppository Place 1 suppository (25 mg total) rectally every 12 (twelve) hours as needed for nausea or vomiting. 11/04/24  Yes Neysa Thersia RAMAN, PA-C  aluminum-magnesium hydroxide 200-200  MG/5ML suspension Take 10 mLs by mouth every 6 (six) hours as needed for indigestion.    [provider]  dexamethasone  (DECADRON ) 4 MG tablet Take 1 tablet (4 mg total) by mouth daily. Take one daily for 3 days beginning day after each chemotherapy for nausea prevention 10/21/24   Cloretta Arley NOVAK, MD  lidocaine -prilocaine  (EMLA ) cream Apply 1 Application topically as directed. Apply 1/2 tablespoon to port site 1-2 hours prior to stick and cover with Press-and-Seal to numb port site. Do not start until 14 days of port placement. 01/27/24   Cloretta Arley NOVAK, MD  OLANZapine  (ZYPREXA ) 5 MG tablet Take 1 tablet (5 mg total) by mouth as directed. Take 5 mg night before each chemotherapy and then daily x 3 days after each chemo treatment beginning day after each treatment 07/21/24   Cloretta Arley NOVAK, MD  ondansetron  (ZOFRAN ) 8 MG tablet Take 1 tablet (8 mg total) by mouth every 8 (eight) hours as needed for nausea. May start 72 hours after chemotherapy treatment given due to receiving Aloxi  on day 1 Patient not taking: Reported on 10/21/2024 01/27/24   Cloretta Arley NOVAK, MD  pantoprazole  (PROTONIX ) 40 MG tablet Take 1 tablet (40 mg total) by mouth 2 (two) times daily. 10/21/24   Cloretta Arley NOVAK, MD  prochlorperazine  (COMPAZINE ) 10 MG tablet Take 1 tablet (10 mg total) by mouth every 6 (six) hours as needed for nausea. Patient not taking: Reported on 10/21/2024 01/27/24  Cloretta Arley NOVAK, MD  scopolamine  (TRANSDERM-SCOP) 1 MG/3DAYS Place 1 patch (1 mg total) onto the skin as directed. Apply patch to skin morning of each chemotherapy and remove after 3 days 10/21/24   Cloretta Arley NOVAK, MD  traMADol  (ULTRAM ) 50 MG tablet Take 1 tablet (50 mg total) by mouth every 8 (eight) hours as needed. Do not drive while taking pain medication. Patient not taking: Reported on 10/21/2024 01/27/24   Debby Olam POUR, NP    Allergies: Fluorouracil     Review of Systems  Constitutional:  Negative for chills and fever.   Respiratory:  Negative for shortness of breath.   Cardiovascular:  Negative for chest pain.  Gastrointestinal:  Positive for abdominal pain, nausea and vomiting. Negative for constipation and diarrhea.  Genitourinary:  Negative for dysuria.  Neurological:  Positive for weakness. Negative for headaches.    Updated Vital Signs BP 132/83 (BP Location: Right Arm)   Pulse 70   Temp 98.4 F (36.9 C) (Oral)   Resp 16   SpO2 97%   Physical Exam Constitutional:      Appearance: Normal appearance.  HENT:     Head: Normocephalic and atraumatic.     Nose: Nose normal.     Mouth/Throat:     Mouth: Mucous membranes are moist.     Pharynx: Oropharynx is clear.  Cardiovascular:     Rate and Rhythm: Normal rate.  Pulmonary:     Effort: Pulmonary effort is normal.  Abdominal:     General: Bowel sounds are normal.     Palpations: Abdomen is soft.     Tenderness: There is abdominal tenderness.     Comments: Mild epigastric tenderness  Musculoskeletal:        General: Normal range of motion.  Skin:    General: Skin is warm and dry.  Neurological:     Mental Status: He is alert and oriented to person, place, and time.  Psychiatric:        Mood and Affect: Mood normal.        Behavior: Behavior normal.     (all labs ordered are listed, but only abnormal results are displayed) Labs Reviewed  URINALYSIS, ROUTINE W REFLEX MICROSCOPIC - Abnormal; Notable for the following components:      Result Value   Ketones, ur 15 (*)    Protein, ur TRACE (*)    All other components within normal limits  COMPREHENSIVE METABOLIC PANEL WITH GFR  CBC WITH DIFFERENTIAL/PLATELET  LIPASE, BLOOD  H. PYLORI ANTIGEN, STOOL    EKG: None  Radiology: No results found.    Medications Ordered in the ED  lactated ringers  bolus 1,000 mL ( Intravenous Stopped 11/04/24 1739)  metoCLOPramide  (REGLAN ) injection 10 mg (10 mg Intravenous Given 11/04/24 1640)  heparin  lock flush 100 unit/mL (500 Units  Intracatheter Given 11/04/24 1903)                                   Medical Decision Making Amount and/or Complexity of Data Reviewed Labs: ordered.  Risk Prescription drug management.   Patient is a 58 year old male with a history of chronic gastric cancer on chemotherapy followed by oncology who presents to the ED for increasing nausea/vomiting for the past 2 to 3 weeks.  Please see detailed HPI above.  On exam patient is alert and in no acute distress lying on the bed.  Physical exam as noted above.  Differential includes  acute viral illness, medication reaction secondary to chemotherapy, gastritis, colitis, acute abdomen, H. pylori.  Lab workup overall reassuring today with no leukocytosis or elevated LFTs.  No signs of infection on the urine.  CT of the abdomen pelvis from oncology office yesterday was reviewed.  This did show diffuse mild wall thickening in the distal gastric body and antrum without focal mass, this is similar to previous imaging.  There is also small volume ascites in the pelvis as well as some proximal jejunal mural thickening with possible nondistention versus proximal enteritis.  Oncology had reviewed this imaging as well but did not express concerns for any acute worsening metastatic disease or acute process.  Unfortunately I do suspect that patient's ongoing symptoms are secondary to underlying gastric cancer as well as chemotherapy treatments.  He was given Reglan  and a liter of fluids with improvement in symptoms today.  Otherwise vital signs stable.  Stable for discharge home.  Patient and his wife are very concerned for infection and H. pylori.  I advised they can follow-up with PCP or oncology as scheduled next week for further testing as warranted.  He does have appoint with oncologist next week for follow-up.  Prescribed Reglan  and Compazine  for nausea as well.  Return precautions provided for worsening symptoms.   Final diagnoses:  Nausea and vomiting,  unspecified vomiting type    ED Discharge Orders          Ordered    metoCLOPramide  (REGLAN ) 10 MG tablet  Every 6 hours        11/04/24 1846    prochlorperazine  (COMPAZINE ) 25 MG suppository  Every 12 hours PRN        11/04/24 1846               Neysa Thersia RAMAN, PA-C 11/04/24 1927    Pamella Ozell LABOR, DO 11/05/24 0021

## 2024-11-04 NOTE — ED Notes (Signed)
 Pt is aware urine and stool sample is needed to ne collected when able.

## 2024-11-05 ENCOUNTER — Telehealth: Payer: Self-pay

## 2024-11-05 NOTE — Telephone Encounter (Signed)
 The patient's wife called to request that our laboratory perform an H. pylori test. The patient was discharged from the hospital yesterday in stable condition; however, the test could not be conducted prior to discharge. I advised the patient's wife to contact the gastrointestinal specialist to schedule the test, as our facility does not perform this particular test. The patient is scheduled to see the gastroenterologist in January, but she expressed a preference to avoid waiting until then. She mentioned that once the patient has a bowel movement, she will bring the sample back to the hospital for the H. pylori test.

## 2024-11-06 ENCOUNTER — Other Ambulatory Visit: Payer: Self-pay | Admitting: Oncology

## 2024-11-09 ENCOUNTER — Inpatient Hospital Stay

## 2024-11-09 ENCOUNTER — Other Ambulatory Visit

## 2024-11-09 ENCOUNTER — Other Ambulatory Visit: Payer: Self-pay

## 2024-11-09 ENCOUNTER — Other Ambulatory Visit: Payer: Self-pay | Admitting: *Deleted

## 2024-11-09 ENCOUNTER — Telehealth: Payer: Self-pay

## 2024-11-09 ENCOUNTER — Inpatient Hospital Stay (HOSPITAL_BASED_OUTPATIENT_CLINIC_OR_DEPARTMENT_OTHER): Admitting: Oncology

## 2024-11-09 VITALS — BP 137/89 | HR 84 | Temp 97.8°F | Resp 18 | Ht 72.0 in | Wt 175.5 lb

## 2024-11-09 DIAGNOSIS — R112 Nausea with vomiting, unspecified: Secondary | ICD-10-CM

## 2024-11-09 DIAGNOSIS — K219 Gastro-esophageal reflux disease without esophagitis: Secondary | ICD-10-CM

## 2024-11-09 DIAGNOSIS — E860 Dehydration: Secondary | ICD-10-CM

## 2024-11-09 DIAGNOSIS — C169 Malignant neoplasm of stomach, unspecified: Secondary | ICD-10-CM

## 2024-11-09 DIAGNOSIS — R935 Abnormal findings on diagnostic imaging of other abdominal regions, including retroperitoneum: Secondary | ICD-10-CM

## 2024-11-09 LAB — CMP (CANCER CENTER ONLY)
ALT: 37 U/L (ref 0–44)
AST: 25 U/L (ref 15–41)
Albumin: 4.3 g/dL (ref 3.5–5.0)
Alkaline Phosphatase: 99 U/L (ref 38–126)
Anion gap: 10 (ref 5–15)
BUN: 12 mg/dL (ref 6–20)
CO2: 27 mmol/L (ref 22–32)
Calcium: 9.8 mg/dL (ref 8.9–10.3)
Chloride: 97 mmol/L — ABNORMAL LOW (ref 98–111)
Creatinine: 1.03 mg/dL (ref 0.61–1.24)
GFR, Estimated: >60 mL/min (ref >60)
Glucose, Bld: 114 mg/dL — ABNORMAL HIGH (ref 70–99)
Potassium: 4.2 mmol/L (ref 3.5–5.1)
Sodium: 135 mmol/L (ref 135–145)
Total Bilirubin: 0.4 mg/dL (ref 0.0–1.2)
Total Protein: 7.2 g/dL (ref 6.5–8.1)

## 2024-11-09 LAB — CBC WITH DIFFERENTIAL (CANCER CENTER ONLY)
Abs Immature Granulocytes: 0.01 K/uL (ref 0.00–0.07)
Basophils Absolute: 0 K/uL (ref 0.0–0.1)
Basophils Relative: 0 %
Eosinophils Absolute: 0.4 K/uL (ref 0.0–0.5)
Eosinophils Relative: 6 %
HCT: 42 % (ref 39.0–52.0)
Hemoglobin: 14.1 g/dL (ref 13.0–17.0)
Immature Granulocytes: 0 %
Lymphocytes Relative: 18 %
Lymphs Abs: 1.2 K/uL (ref 0.7–4.0)
MCH: 30 pg (ref 26.0–34.0)
MCHC: 33.6 g/dL (ref 30.0–36.0)
MCV: 89.4 fL (ref 80.0–100.0)
Monocytes Absolute: 0.6 K/uL (ref 0.1–1.0)
Monocytes Relative: 9 %
Neutro Abs: 4.5 K/uL (ref 1.7–7.7)
Neutrophils Relative %: 67 %
Platelet Count: 259 K/uL (ref 150–400)
RBC: 4.7 MIL/uL (ref 4.22–5.81)
RDW: 12.8 % (ref 11.5–15.5)
WBC Count: 6.7 K/uL (ref 4.0–10.5)
nRBC: 0 % (ref 0.0–0.2)

## 2024-11-09 MED ORDER — DEXAMETHASONE 4 MG PO TABS: 4.0000 mg | ORAL_TABLET | Freq: Every day | ORAL | 1 refills | Status: DC

## 2024-11-09 MED ORDER — LORAZEPAM 0.5 MG PO TABS: 0.5000 mg | ORAL_TABLET | Freq: Four times a day (QID) | ORAL | 0 refills | Status: DC | PRN

## 2024-11-09 MED ORDER — SODIUM CHLORIDE 0.9 % IV SOLN: INTRAVENOUS | Status: AC

## 2024-11-09 MED ORDER — DEXAMETHASONE SOD PHOSPHATE PF 10 MG/ML IJ SOLN
10.0000 mg | Freq: Once | INTRAMUSCULAR | Status: AC
  Administered 2024-11-09: 10 mg via INTRAVENOUS

## 2024-11-09 NOTE — Telephone Encounter (Signed)
 Pt was scheduled for the Upper GI with small bowel follow through on 11/12/2024 at 9:00 AM at [facility]. Pt to arrive at 8:30 AM. Nothing to eat or drink starting at midnight. Orvil made aware.  Pt was scheduled for the Upper endoscopy on 11/18/2024 at 7:30 AM  in the [facility] with Dr. Charlanne. Orvil made aware.  Ambulatory referral to GI placed in Epic.  Prep instructions were sent to pt via my chart. Orvil made aware.  Orvil  verbalized understanding with all questions answered.   Routed as FYI

## 2024-11-09 NOTE — Patient Instructions (Signed)

## 2024-11-09 NOTE — Progress Notes (Signed)
 Patients port flushed without difficulty.  Good blood return noted with no bruising or swelling noted at site.  Patient symptomatic with nausea and vomiting. Patient remains accessed for possible treatment. Devere Leaven RN made aware.

## 2024-11-09 NOTE — Telephone Encounter (Signed)
Thanks a lot RG 

## 2024-11-09 NOTE — Progress Notes (Signed)
 [facility] OFFICE PROGRESS NOTE   Diagnosis: Gastric cancer  INTERVAL HISTORY:   Henry May returns as scheduled.  He continues to have nausea and severe heartburn .  He is having bowel movements.  He vomited last night.  No pain.  He can tolerate liquids.  Objective:  Vital signs in last 24 hours:  Blood pressure 137/89, pulse 84, temperature 97.8 F (36.6 C), temperature source Temporal, resp. rate 18, height 6' (1.829 m), weight 175 lb 8 oz (79.6 kg), SpO2 98%.    HEENT: No thrush, the mucous membranes are moist Resp: Lungs clear bilaterally Cardio: Regular rate and rhythm GI: Soft, no apparent ascites, nontender, no mass, no hepatosplenomegaly Vascular: Leg edema   Portacath/PICC-without erythema  Lab Results:  Lab Results  Component Value Date   WBC 6.7 11/09/2024   HGB 14.1 11/09/2024   HCT 42.0 11/09/2024   MCV 89.4 11/09/2024   PLT 259 11/09/2024   NEUTROABS 4.5 11/09/2024    CMP  Lab Results  Component Value Date   NA 139 11/04/2024   K 3.9 11/04/2024   CL 101 11/04/2024   CO2 26 11/04/2024   GLUCOSE 93 11/04/2024   BUN 14 11/04/2024   CREATININE 0.91 11/04/2024   CALCIUM  9.3 11/04/2024   PROT 6.8 11/04/2024   ALBUMIN  4.0 11/04/2024   AST 26 11/04/2024   ALT 42 11/04/2024   ALKPHOS 98 11/04/2024   BILITOT 0.3 11/04/2024   GFRNONAA >60 11/04/2024   GFRAA >60 09/03/2020    Lab Results  Component Value Date   CEA 3.57 01/15/2024    Lab Results  Component Value Date   INR 1.0 01/22/2024   LABPROT 13.7 01/22/2024    Imaging:  No results found.  Medications: I have reviewed the patient's current medications.   Assessment/Plan: Gastric cancer 11/21/2023 EGD-gastric body with infiltrative looking lesion; biopsy shows involvement of a hypercellular lesion that suggests diffuse carcinoma by morphology CTs abdomen/pelvis 11/28/2023-diffuse fatty infiltration of the liver; gastric wall thickening; lymph nodes up to 6 mm  in diameter near the stomach wall. 12/31/2023 upper endoscopy-large diffuse friable infiltrative polypoid and ulcerated circumferential mass found in the gastric body.  Scope was passed beyond the mass with normal antrum/pylorus.  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  Negative for HER2 (1+); mismatch repair protein IHC normal; PD-L1 CPS 0%; CLDN18 positive: 90% of tumor cells with 2+/3+ membrane staining PET scan 01/07/2024-circumferential hypermetabolic gastric mucosal thickening.  Ill-defined hypermetabolic lymph nodes in the gastrohepatic ligament.  Intense hypermetabolic activity associated with the right lobe of the thyroid  gland.  Small hypermetabolic right axillary node favored reactive. 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  Gastric wall thickening slightly improved. Cycle 7 oxaliplatin , zolbetuximab 05/05/2024, Fulphila  Cycle 8 zolbetuximab 05/20/2024, oxaliplatin  held due to neuropathy, no Fulphila  Cycle 9 oxaliplatin /zolbetuximab 06/03/2024, Fulphila  Cycle 10 oxaliplatin /zolbetuximab 06/17/2024, Fulphila  Cycle 11 oxaliplatin /zolbetuximab 06/30/2024, Fulphila , oxaliplatin  dose reduced Cycle 12 zolbetuximab 07/15/2024, oxaliplatin  held secondary to neuropathy Cycle 13 zolbetuximab 07/29/2024, oxaliplatin  held secondary to  neuropathy Cycle 14 zolbetuximab 08/26/2024, oxaliplatin   held secondary to neuropathy 09/01/2024 CTs-mild distal gastric wall thickening without discrete mass.  No metastatic disease. Cycle 15 zolbetuximab 09/09/2024, oxaliplatin  held due to neuropathy Cycle 16 zolbetuximab 09/30/2024, oxaliplatin  held due to neuropathy Cycle 17 zolbetuximab 10/21/2024, oxaliplatin  held due to neuropathy 11/03/2024 CT abdomen/pelvis: Diffuse mild wall thickening of the distal gastric body and antrum-stable, mild increase in a right gastric lymph node small volume ascites without omental nodularity, proximal jejunal mural thickening Early satiety, postprandial abdominal pain, weight loss secondary to #1 History of bilateral lower extremity DVT 2021-anticoagulation discontinued due to massive retroperitoneal bleed, IVC filter placed 08/24/2020 Hospitalized with acute respiratory failure due to COVID-19 08/06/2020 - 09/04/2020 History of massive retroperitoneal bleed secondary to anticoagulation September 2021 Thyroid  ultrasound 01/13/2024-no abnormal nodule identified in the right lobe.  1.1 cm thyroid  isthmus nodule meets criteria for 1 year follow-up ultrasound. Admission 02/01/2024 with increased abdominal pain/ascites Hospitalization with altered mental status 02/05/2024 through 02/13/2024; question rare case of 5-FU psychosis.  Improved 02/16/2024.  Mental status at baseline 02/23/2024. Neutropenia 02/16/2024 Mucositis 02/16/2024.  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  Persistent cold sensitivity 05/20/2024, oxaliplatin  held.  Cold sensitivity resolved 06/03/2024, oxaliplatin  resumed.  Mild loss of vibratory sense, oxaliplatin  dose reduced 06/29/2024      Disposition: Henry May has a history of metastatic gastric cancer with abdominal carcinomatosis.   He has been maintained on systemic therapy since February.  His clinical status improved markedly after beginning systemic therapy.  Restaging CTs have revealed significant improvement in the carcinomatosis.  He has developed refractory reflux symptoms and nausea over the past month.  He has lost a significant amount of weight.  I am concerned his symptoms are related to tumor progression in the stomach and small bowel.  He may have gastric outlet obstruction.  Zolbetuximab will be placed on hold.  He will receive intravenous fluids today.  He will begin a trial of Decadron  as an antiemetic.  He will also try lorazepam  for nausea.  He will continue pantoprazole  and Maalox.  Henry May will follow a liquid diet.  I contacted Dr. Charlanne.  He will order an upper GI study for next week.  He will also schedule an upper endoscopy.  Henry May will go to the [facility] long emergency room if he is unable to tolerate liquids.  He will return for an office visit here after the upper GI study.  Treatment options gastric cancer will be limited if he is unable to maintain his hydration and nutrition.  Arley Hof, MD  11/09/2024  9:51 AM

## 2024-11-10 ENCOUNTER — Inpatient Hospital Stay

## 2024-11-10 ENCOUNTER — Telehealth: Payer: Self-pay | Admitting: Oncology

## 2024-11-10 DIAGNOSIS — E860 Dehydration: Secondary | ICD-10-CM

## 2024-11-10 MED ORDER — SODIUM CHLORIDE 0.9 % IV SOLN: INTRAVENOUS | Status: AC

## 2024-11-10 MED ORDER — FAMOTIDINE 20 MG PO TABS
20.0000 mg | ORAL_TABLET | Freq: Once | ORAL | Status: AC
  Administered 2024-11-10: 20 mg via ORAL
  Filled 2024-11-10: qty 1

## 2024-11-10 NOTE — Telephone Encounter (Signed)
 Called to confirm appts for 12/1.

## 2024-11-10 NOTE — Patient Instructions (Signed)

## 2024-11-12 ENCOUNTER — Ambulatory Visit (HOSPITAL_COMMUNITY)
Admission: RE | Admit: 2024-11-12 | Discharge: 2024-11-12 | Disposition: A | Discharge status: Home / Self Care | Source: Ambulatory Visit | Attending: Gastroenterology | Admitting: Gastroenterology

## 2024-11-12 DIAGNOSIS — K219 Gastro-esophageal reflux disease without esophagitis: Secondary | ICD-10-CM | POA: Diagnosis present

## 2024-11-12 DIAGNOSIS — R935 Abnormal findings on diagnostic imaging of other abdominal regions, including retroperitoneum: Secondary | ICD-10-CM | POA: Insufficient documentation

## 2024-11-12 DIAGNOSIS — R112 Nausea with vomiting, unspecified: Secondary | ICD-10-CM | POA: Insufficient documentation

## 2024-11-12 DIAGNOSIS — C169 Malignant neoplasm of stomach, unspecified: Secondary | ICD-10-CM | POA: Insufficient documentation

## 2024-11-14 ENCOUNTER — Ambulatory Visit: Payer: Self-pay | Admitting: Gastroenterology

## 2024-11-15 ENCOUNTER — Inpatient Hospital Stay: Admitting: Nutrition

## 2024-11-15 ENCOUNTER — Other Ambulatory Visit: Payer: Self-pay | Admitting: *Deleted

## 2024-11-15 ENCOUNTER — Inpatient Hospital Stay: Attending: Nurse Practitioner | Admitting: Oncology

## 2024-11-15 ENCOUNTER — Inpatient Hospital Stay

## 2024-11-15 ENCOUNTER — Other Ambulatory Visit: Payer: Self-pay

## 2024-11-15 VITALS — BP 127/88 | HR 66 | Temp 97.8°F | Resp 18 | Ht 72.0 in | Wt 169.5 lb

## 2024-11-15 DIAGNOSIS — Z9221 Personal history of antineoplastic chemotherapy: Secondary | ICD-10-CM | POA: Insufficient documentation

## 2024-11-15 DIAGNOSIS — R531 Weakness: Secondary | ICD-10-CM | POA: Diagnosis not present

## 2024-11-15 DIAGNOSIS — C169 Malignant neoplasm of stomach, unspecified: Secondary | ICD-10-CM

## 2024-11-15 DIAGNOSIS — C162 Malignant neoplasm of body of stomach: Secondary | ICD-10-CM | POA: Insufficient documentation

## 2024-11-15 DIAGNOSIS — R634 Abnormal weight loss: Secondary | ICD-10-CM | POA: Insufficient documentation

## 2024-11-15 DIAGNOSIS — R110 Nausea: Secondary | ICD-10-CM | POA: Insufficient documentation

## 2024-11-15 DIAGNOSIS — K219 Gastro-esophageal reflux disease without esophagitis: Secondary | ICD-10-CM | POA: Insufficient documentation

## 2024-11-15 DIAGNOSIS — C786 Secondary malignant neoplasm of retroperitoneum and peritoneum: Secondary | ICD-10-CM | POA: Insufficient documentation

## 2024-11-15 DIAGNOSIS — E860 Dehydration: Secondary | ICD-10-CM | POA: Diagnosis not present

## 2024-11-15 MED ORDER — METOCLOPRAMIDE HCL 10 MG PO TABS: 10.0000 mg | ORAL_TABLET | Freq: Four times a day (QID) | ORAL | 0 refills | Status: DC

## 2024-11-15 MED ORDER — SODIUM CHLORIDE 0.9 % IV SOLN: INTRAVENOUS | Status: AC

## 2024-11-15 NOTE — Progress Notes (Signed)
 [name] has not been eating due to severe reflux and spitting up a lot of fluid as well and feels dehydrated. OK per Dr. Cloretta to start NS IV at 500 cc/hr to infuse until last patient in infusion is completed.

## 2024-11-15 NOTE — Patient Instructions (Signed)
 Dehydration, Adult Dehydration is a condition in which there is not enough water or other fluids in the body. This happens when a person loses more fluids than they take in. Important organs cannot work right without the right amount of fluids. Any loss of fluids from the body can cause dehydration. Dehydration can be mild, worse, or very bad. It should be treated right away to keep it from getting very bad. What are the causes? Conditions that cause loss of water in the body. They include: Watery poop (diarrhea). Vomiting. Sweating a lot. Fever. Infection. Peeing (urinating) a lot. Not drinking enough fluids. Certain medicines, such as medicines that take extra fluid out of the body (diuretics). Lack of safe drinking water. Not being able to get enough water and food. What increases the risk? Having a long-term (chronic) illness that has not been treated the right way, such as: Diabetes. Heart disease. Kidney disease. Being 25 years of age or older. Having a disability. Living in a place that is high above the ground or sea (high in altitude). The thinner, drier air causes more fluid loss. Doing exercises that put stress on your body for a long time. Being active when in hot places. What are the signs or symptoms? Symptoms of dehydration depend on how bad it is. Mild or worse dehydration Thirst. Dry lips or dry mouth. Feeling dizzy or light-headed. Muscle cramps. Passing little pee or dark pee. Pee may be the color of tea. Headache. Very bad dehydration Changes in skin. Skin may: Be cold to the touch (clammy). Be blotchy or pale. Not go back to normal right after you pinch it and let it go. Little or no tears, pee, or sweat. Fast breathing. Low blood pressure. Weak pulse. Pulse that is more than 100 beats a minute when you are sitting still. Other changes, such as: Feeling very thirsty. Eyes that look hollow (sunken). Cold hands and feet. Being confused. Being very  tired (lethargic) or having trouble waking from sleep. Losing weight. Loss of consciousness. How is this treated? Treatment for this condition depends on how bad your dehydration is. Treatment should start right away. Do not wait until your condition gets very bad. Very bad dehydration is an emergency. You will need to go to a hospital. Mild or worse dehydration can be treated at home. You may be asked to: Drink more fluids. Drink an oral rehydration solution (ORS). This drink gives you the right amount of fluids, salts, and minerals (electrolytes). Very bad dehydration can be treated: With fluids through an IV tube. By correcting low levels of electrolytes in the body. By treating the problem that caused your dehydration. Follow these instructions at home: Oral rehydration solution If told by your doctor, drink an ORS: Make an ORS. Use instructions on the package. Start by drinking small amounts, about  cup (120 mL) every 5-10 minutes. Slowly drink more until you have had the amount that your doctor said to have.  Eating and drinking  Drink enough clear fluid to keep your pee pale yellow. If you were told to drink an ORS, finish the ORS first. Then, start slowly drinking other clear fluids. Drink fluids such as: Water. Do not drink only water. Doing that can make the salt (sodium) level in your body get too low. Water from ice chips you suck on. Fruit juice that you have added water to (diluted). Low-calorie sports drinks. Eat foods that have the right amounts of salts and minerals, such as bananas, oranges, potatoes,  tomatoes, or spinach. Do not drink alcohol. Avoid drinks that have caffeine or sugar. These include:: High-calorie sports drinks. Fruit juice that you did not add water to. Soda. Coffee or energy drinks. Avoid foods that are greasy or have a lot of fat or sugar. General instructions Take over-the-counter and prescription medicines only as told by your doctor. Do  not take sodium tablets. Doing that can make the salt level in your body get too high. Return to your normal activities as told by your doctor. Ask your doctor what activities are safe for you. Keep all follow-up visits. Your doctor may check and change your treatment. Contact a doctor if: You have pain in your belly (abdomen) and the pain: Gets worse. Stays in one place. You have a rash. You have a stiff neck. You get angry or annoyed more easily than normal. You are more tired or have a harder time waking than normal. You feel weak or dizzy. You feel very thirsty. Get help right away if: You have any symptoms of very bad dehydration. You vomit every time you eat or drink. Your vomiting gets worse, does not go away, or you vomit blood or green stuff. You are getting treatment, but symptoms are getting worse. You have a fever. You have a very bad headache. You have: Diarrhea that gets worse or does not go away. Blood in your poop (stool). This may cause poop to look black and tarry. No pee in 6-8 hours. Only a small amount of pee in 6-8 hours, and the pee is very dark. You have trouble breathing. These symptoms may be an emergency. Get help right away. Call 911. Do not wait to see if the symptoms will go away. Do not drive yourself to the hospital. This information is not intended to replace advice given to you by your health care provider. Make sure you discuss any questions you have with your health care provider. Document Revised: 07/01/2022 Document Reviewed: 07/01/2022 Elsevier Patient Education  2024 [employer].

## 2024-11-15 NOTE — Progress Notes (Signed)
 Henry May OFFICE PROGRESS NOTE   Diagnosis: Gastric cancer  INTERVAL HISTORY:   Henry May turns as scheduled.  He is here with his wife.  He continues to have reflux and intermittent nausea.  Reglan  has helped partially.  He has not tried lorazepam .  He vomits infrequently.  He is tolerating some liquids.  He is scheduled for an upper endoscopy 11/18/2024.  No pain.  Objective:  Vital signs in last 24 hours:  Blood pressure 127/88, pulse 66, temperature 97.8 F (36.6 C), temperature source Temporal, resp. rate 18, height 6' (1.829 m), weight 169 lb 8 oz (76.9 kg), SpO2 100%.    HEENT: The mucous membranes are moist.  No thrush Resp: Lungs clear bilaterally Cardio: Regular rate and rhythm GI: Nondistended, no hepatosplenomegaly, nontender Vascular: No leg edema   Portacath/PICC-without erythema  Lab Results:  Lab Results  Component Value Date   WBC 6.7 11/09/2024   HGB 14.1 11/09/2024   HCT 42.0 11/09/2024   MCV 89.4 11/09/2024   PLT 259 11/09/2024   NEUTROABS 4.5 11/09/2024    CMP  Lab Results  Component Value Date   NA 135 11/09/2024   K 4.2 11/09/2024   CL 97 (L) 11/09/2024   CO2 27 11/09/2024   GLUCOSE 114 (H) 11/09/2024   BUN 12 11/09/2024   CREATININE 1.03 11/09/2024   CALCIUM  9.8 11/09/2024   PROT 7.2 11/09/2024   ALBUMIN  4.3 11/09/2024   AST 25 11/09/2024   ALT 37 11/09/2024   ALKPHOS 99 11/09/2024   BILITOT 0.4 11/09/2024   GFRNONAA >60 11/09/2024   GFRAA >60 09/03/2020    Lab Results  Component Value Date   CEA 3.57 01/15/2024    Lab Results  Component Value Date   INR 1.0 01/22/2024   LABPROT 13.7 01/22/2024    Imaging:  DG UGI W SMALL BOWEL SINGLE CM Result Date: 11/12/2024 CLINICAL DATA:  History of gastric cancer and status post chemotherapy. Persistent nausea and reflux symptoms. Oncology concern for possible gastric outlet obstruction. Consult for upper GI with small bowel follow-through study. EXAM: DG UGI W/  SMALL BOWEL TECHNIQUE: Combined double and single contrast examination was performed using effervescent crystals, high-density barium and thin liquid barium. Subsequently, serial images of the small bowel were obtained including spot views of the terminal ileum. This exam was performed by Kimble Clas, PA-C, and was supervised and interpreted by Dr. Juliene Balder. FLUOROSCOPY: Radiation Exposure Index (as provided by the fluoroscopic device): 90.40 mGy Kerma COMPARISON:  CT abdomen pelvis 11/02/2024 FINDINGS: Esophagus: Normal appearance. Esophageal motility: Within normal limits. Gastroesophageal reflux: None visualized. Ingested 13 mm barium tablet: Not given Stomach: Persistent narrowing in the distal stomach near the antrum with filling defects. Filling defects could be associated with gastric contents. The area of gastric narrowing corresponds with the wall thickening on the recent CT. Gastric emptying: Normal. Duodenum: Small duodenal diverticulum. Mild to moderate dilatation of the duodenum and proximal jejunum. Small bowel: Dilatation of the proximal jejunum. Normal caliber of the distal small bowel. Normal appearance of the terminal ileum. Contrast in the right colon after 3 hours. IMPRESSION: 1. Persistent narrowing at the gastric antrum and corresponds with the wall thickening on the recent CT. There are filling defects at this area of narrowing that could represent gastric contents but indeterminate. This could be further evaluated with direct visualization. 2. Normal gastric emptying. 3. Dilated duodenum and proximal jejunum. Contrast moved into the right colon after 3 hours. No evidence for a bowel  obstruction. Electronically Signed   By: Juliene Balder M.D.   On: 11/12/2024 14:17    Medications: I have reviewed the patient's current medications.   Assessment/Plan: Gastric cancer 11/21/2023 EGD-gastric body with infiltrative looking lesion; biopsy shows involvement of a hypercellular lesion that  suggests diffuse carcinoma by morphology CTs abdomen/pelvis 11/28/2023-diffuse fatty infiltration of the liver; gastric wall thickening; lymph nodes up to 6 mm in diameter near the stomach wall. 12/31/2023 upper endoscopy-large diffuse friable infiltrative polypoid and ulcerated circumferential mass found in the gastric body.  Scope was passed beyond the mass with normal antrum/pylorus.  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  Negative for HER2 (1+); mismatch repair protein IHC normal; PD-L1 CPS 0%; CLDN18 positive: 90% of tumor cells with 2+/3+ membrane staining PET scan 01/07/2024-circumferential hypermetabolic gastric mucosal thickening.  Ill-defined hypermetabolic lymph nodes in the gastrohepatic ligament.  Intense hypermetabolic activity associated with the right lobe of the thyroid  gland.  Small hypermetabolic right axillary node favored reactive. 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  Gastric wall thickening slightly improved. Cycle 7 oxaliplatin , zolbetuximab 05/05/2024, Fulphila  Cycle 8 zolbetuximab 05/20/2024, oxaliplatin  held due to neuropathy, no Fulphila  Cycle 9 oxaliplatin /zolbetuximab 06/03/2024, Fulphila  Cycle 10 oxaliplatin /zolbetuximab 06/17/2024, Fulphila  Cycle 11 oxaliplatin /zolbetuximab 06/30/2024, Fulphila , oxaliplatin   dose reduced Cycle 12 zolbetuximab 07/15/2024, oxaliplatin  held secondary to neuropathy Cycle 13 zolbetuximab 07/29/2024, oxaliplatin  held secondary to neuropathy Cycle 14 zolbetuximab 08/26/2024, oxaliplatin  held secondary to neuropathy 09/01/2024 CTs-mild distal gastric wall thickening without discrete mass.  No metastatic disease. Cycle 15 zolbetuximab 09/09/2024, oxaliplatin  held due to neuropathy Cycle 16 zolbetuximab 09/30/2024, oxaliplatin  held due to neuropathy Cycle 17 zolbetuximab 10/21/2024, oxaliplatin  held due to neuropathy 11/03/2024 CT abdomen/pelvis: Diffuse mild wall thickening of the distal gastric body and antrum-stable, mild increase in a right gastric lymph node small volume ascites without omental nodularity, proximal jejunal mural thickening 11/04/2024 upper GI: Narrowing of the gastric antrum, normal gastric emptying, dilated duodenum and proximal jejunum without evidence of a bowel obstruction Early satiety, postprandial abdominal pain, weight loss secondary to #1 History of bilateral lower extremity DVT 2021-anticoagulation discontinued due to massive retroperitoneal bleed, IVC filter placed 08/24/2020 Hospitalized with acute respiratory failure due to COVID-19 08/06/2020 - 09/04/2020 History of massive retroperitoneal bleed secondary to anticoagulation September 2021 Thyroid  ultrasound 01/13/2024-no abnormal nodule identified in the right lobe.  1.1 cm thyroid  isthmus nodule meets criteria for 1 year follow-up ultrasound. Admission 02/01/2024 with increased abdominal pain/ascites Hospitalization with altered mental status 02/05/2024 through 02/13/2024; question rare case of 5-FU psychosis.  Improved 02/16/2024.  Mental status at baseline 02/23/2024. Neutropenia 02/16/2024 Mucositis 02/16/2024.  Resolved 02/23/2024 Right knee pain/edema/erythema 03/01/2024-Doppler study negative for DVT History of gout Oxaliplatin  neuropathy-prolonged cold sensitivity, diminished vibratory sense following  cycle 5 chemotherapy.  Persistent cold sensitivity 05/20/2024, oxaliplatin  held.  Cold sensitivity resolved 06/03/2024, oxaliplatin  resumed.  Mild loss of vibratory sense, oxaliplatin  dose reduced 06/29/2024       Disposition: Mr Henry May has metastatic gastric cancer.  He has developed persistent reflux and nausea over the past month with associated weight loss.  I reviewed the results and images of the 11/04/2024 upper GI study.  He has narrowing at the gastric antrum and dilation of the proximal small bowel.  The clinical and x-ray findings are likely related to progressive metastatic gastric cancer.  He is scheduled for an upper endoscopy with Dr. Charlanne on 11/18/2024.  He understands Dr. Charlanne may be able to palliate his symptoms with stent placement.  He requests IV fluids today and again over the next several days.  He will continue liquids as tolerated.  He will continue Reglan  and begin lorazepam  as needed for nausea.  He will discontinue Decadron  since this did not help his nausea.  He will contact us  if he is unable to tolerate fluids.  He will return for an office visit on 11/22/2024.  We will consider salvage systemic therapy based on the EGD findings.  Treatment options are limited given his adverse neurologic reaction to 5-fluorouracil  and current oxaliplatin  neuropathy.  We can consider cisplatin or carboplatin based therapy.  We can also consider treatment with single agent nivolumab.  Arley Hof, MD  11/15/2024  4:17 PM

## 2024-11-15 NOTE — Progress Notes (Signed)
 RN requested RD see patient during IVF today. Patient last seen by this RD on November 10.  From MD note Nov 25:  Disposition: Henry May has a history of metastatic gastric cancer with abdominal carcinomatosis.  He has been maintained on systemic therapy since February.  His clinical status improved markedly after beginning systemic therapy.  Restaging CTs have revealed significant improvement in the carcinomatosis.   He has developed refractory reflux symptoms and nausea over the past month.  He has lost a significant amount of weight.  I am concerned his symptoms are related to tumor progression in the stomach and small bowel.  He may have gastric outlet obstruction.   Zolbetuximab will be placed on hold.  He will receive intravenous fluids today.  He will begin a trial of Decadron  as an antiemetic.  He will also try lorazepam  for nausea.  He will continue pantoprazole  and Maalox.  Henry Platts will follow a liquid diet.  EGD scheduled for December 4.  Weight: 169.5 pounds December 1 175.5 pounds November 25 185.5 pounds November 10 192.7 pounds October 16 203.8 pounds September 25.   Patient requesting samples of supplements I provided at last visit. Reports he tolerated sips of [employer] Peptide 1.5. He cannot eat much of anything and has been told to follow a liquid diet. He continues to have nausea, vomiting and severe heartburn. He has jello for breakfast and broth for lunch.   *Patient has severe malnutrition secondary to % weight loss, severe muscle and fat loss.  Nutrition Diagnosis: Unintended weight loss continues  Intervention: Provided samples of [employer] Peptide 1.5. Patient will sip on this as tolerated. Follow liquid diet. Follow for outcome of EGD and plan of care. If patient is going to continue treatment, recommend nutrition support to meet 100% estimated needs.  Monitoring, Evaluation, Goals: Increase tolerance of oral diet to provide increased calories and  protein based on plan of care.  Will follow. Please contact RD as needed.

## 2024-11-16 ENCOUNTER — Inpatient Hospital Stay

## 2024-11-16 ENCOUNTER — Other Ambulatory Visit: Payer: Self-pay | Admitting: *Deleted

## 2024-11-16 DIAGNOSIS — E860 Dehydration: Secondary | ICD-10-CM

## 2024-11-16 DIAGNOSIS — C162 Malignant neoplasm of body of stomach: Secondary | ICD-10-CM | POA: Diagnosis not present

## 2024-11-16 MED ORDER — SODIUM CHLORIDE 0.9 % IV SOLN: INTRAVENOUS | Status: AC

## 2024-11-16 NOTE — Progress Notes (Signed)
 MD sherrill giving the okay to leave pt accessed for IVF infusion tomorrow. This RN placing home dressing with CHG biopatch. Pt instructed to not get dressing wet. Pt will return tomorrow for IVF at 1200.

## 2024-11-16 NOTE — Progress Notes (Signed)
 Notified Henry May that we can give IVF today at 1130. She agrees and they will be here.

## 2024-11-16 NOTE — Patient Instructions (Signed)

## 2024-11-17 ENCOUNTER — Inpatient Hospital Stay

## 2024-11-17 ENCOUNTER — Inpatient Hospital Stay: Admitting: Oncology

## 2024-11-17 VITALS — BP 142/76 | HR 68 | Temp 97.7°F | Resp 16

## 2024-11-17 DIAGNOSIS — C162 Malignant neoplasm of body of stomach: Secondary | ICD-10-CM | POA: Diagnosis not present

## 2024-11-17 DIAGNOSIS — E860 Dehydration: Secondary | ICD-10-CM

## 2024-11-17 MED ORDER — SODIUM CHLORIDE 0.9 % IV SOLN: INTRAVENOUS | Status: AC

## 2024-11-17 NOTE — Patient Instructions (Signed)

## 2024-11-18 ENCOUNTER — Telehealth: Payer: Self-pay | Admitting: Pediatrics

## 2024-11-18 ENCOUNTER — Encounter: Payer: Self-pay | Admitting: Gastroenterology

## 2024-11-18 ENCOUNTER — Ambulatory Visit: Admitting: Gastroenterology

## 2024-11-18 ENCOUNTER — Telehealth: Payer: Self-pay | Admitting: Gastroenterology

## 2024-11-18 VITALS — BP 152/93 | HR 62 | Temp 98.3°F | Resp 26 | Ht 72.0 in | Wt 169.0 lb

## 2024-11-18 DIAGNOSIS — R112 Nausea with vomiting, unspecified: Secondary | ICD-10-CM

## 2024-11-18 DIAGNOSIS — C169 Malignant neoplasm of stomach, unspecified: Secondary | ICD-10-CM

## 2024-11-18 DIAGNOSIS — K219 Gastro-esophageal reflux disease without esophagitis: Secondary | ICD-10-CM

## 2024-11-18 MED ORDER — OMEPRAZOLE 40 MG PO CPDR: 40.0000 mg | DELAYED_RELEASE_CAPSULE | Freq: Two times a day (BID) | ORAL | 2 refills | Status: DC

## 2024-11-18 MED ORDER — SODIUM CHLORIDE 0.9 % IV SOLN: 500.0000 mL | Freq: Once | INTRAVENOUS | Status: DC

## 2024-11-18 MED ORDER — SUCRALFATE 1 GM/10ML PO SUSP: 1.0000 g | Freq: Four times a day (QID) | ORAL | 4 refills | Status: AC

## 2024-11-18 NOTE — Progress Notes (Unsigned)
 [facility] History and Physical   Primary Care Physician:  [name], DO   Reason for Procedure:   Metastatic Gastric Ca with persistant N/V  Plan:    Rpt EGD   The patient was provided an opportunity to ask questions and all were answered. The patient agreed with the plan.   HPI: Henry May is a 58 y.o. male  with metastatic gastric cancer  UGI series 11/12/2024 1. Persistent narrowing at the gastric antrum and corresponds with the wall thickening on the recent CT. There are filling defects at this area of narrowing that could represent gastric contents but indeterminate. This could be further evaluated with direct visualization. 2. Normal gastric emptying. 3. Dilated duodenum and proximal jejunum. Contrast moved into the right colon after 3 hours. No evidence for a bowel obstruction. Past Medical History:  Diagnosis Date   Acute respiratory failure (HCC) 08/06/2020   Chronic cough    SINCE 08-06-2020 COVID PNEUMONIA   COVID 08/06/2020   Dvt femoral (deep venous thrombosis) (HCC) 08/2020   LEFT LEG    GERD (gastroesophageal reflux disease)    History of blood transfusion 08/2020   AFTER MI 5 UNITS GIVEN   History of kidney stones    Hypertension    Numbness    LEFT LEG AT TIMES   Pneumonia 08/06/2020   COVID PNEUMONIA    Past Surgical History:  Procedure Laterality Date   CYSTOSCOPY WITH STENT PLACEMENT Left 10/06/2020   Procedure: CYSTOSCOPY WITH STENT PLACEMENT;  Surgeon: Devere Lonni Righter, MD;  Location: [facility];  Service: Urology;  Laterality: Left;   CYSTOSCOPY/URETEROSCOPY/HOLMIUM LASER/STENT PLACEMENT Left 11/14/2020   Procedure: CYSTOSCOPY/URETEROSCOPY/HOLMIUM LASER/STENT PLACEMENT;  Surgeon: Devere Lonni Righter, MD;  Location: [facility];  Service: Urology;  Laterality: Left;  ONLY NEEDS 45 MIN   IR IMAGING GUIDED PORT INSERTION  01/22/2024   IR IVC FILTER PLMT / S&I /IMG GUID/MOD SED  08/24/2020   IR PARACENTESIS  01/22/2024     Prior to Admission medications   Medication Sig Start Date End Date Taking? Authorizing Provider  aluminum-magnesium  hydroxide 200-200 MG/5ML suspension Take 10 mLs by mouth every 6 (six) hours as needed for indigestion.   Yes [provider]  dexamethasone  (DECADRON ) 4 MG tablet Take 1 tablet (4 mg total) by mouth daily. Take in morning 11/09/24  Yes Cloretta Arley NOVAK, MD  metoCLOPramide  (REGLAN ) 10 MG tablet Take 1 tablet (10 mg total) by mouth every 6 (six) hours. 11/15/24  Yes Cloretta Arley NOVAK, MD  pantoprazole  (PROTONIX ) 40 MG tablet Take 1 tablet (40 mg total) by mouth 2 (two) times daily. 10/21/24  Yes Cloretta Arley NOVAK, MD  dexamethasone  (DECADRON ) 4 MG tablet Take 1 tablet (4 mg total) by mouth daily. Take one daily for 3 days beginning day after each chemotherapy for nausea prevention Patient not taking: No sig reported 10/21/24   Cloretta Arley NOVAK, MD  lidocaine -prilocaine  (EMLA ) cream Apply 1 Application topically as directed. Apply 1/2 tablespoon to port site 1-2 hours prior to stick and cover with Press-and-Seal to numb port site. Do not start until 14 days of port placement. 01/27/24   Cloretta Arley NOVAK, MD  LORazepam  (ATIVAN ) 0.5 MG tablet Place 1 tablet (0.5 mg total) under the tongue every 6 (six) hours as needed (nausea). Patient not taking: Reported on 11/15/2024 11/09/24   Cloretta Arley NOVAK, MD  OLANZapine  (ZYPREXA ) 5 MG tablet Take 1 tablet (5 mg total) by mouth as directed. Take 5 mg night before each chemotherapy and  then daily x 3 days after each chemo treatment beginning day after each treatment Patient not taking: No sig reported 07/21/24   Cloretta Arley NOVAK, MD  ondansetron  (ZOFRAN ) 8 MG tablet Take 1 tablet (8 mg total) by mouth every 8 (eight) hours as needed for nausea. May start 72 hours after chemotherapy treatment given due to receiving Aloxi  on day 1 Patient not taking: No sig reported 01/27/24   Cloretta Arley NOVAK, MD  prochlorperazine  (COMPAZINE ) 25 MG suppository  Place 1 suppository (25 mg total) rectally every 12 (twelve) hours as needed for nausea or vomiting. Patient not taking: No sig reported 11/04/24   Neysa Thersia RAMAN, PA-C  scopolamine  (TRANSDERM-SCOP) 1 MG/3DAYS Place 1 patch (1 mg total) onto the skin as directed. Apply patch to skin morning of each chemotherapy and remove after 3 days Patient not taking: No sig reported 10/21/24   Cloretta Arley NOVAK, MD  traMADol  (ULTRAM ) 50 MG tablet Take 1 tablet (50 mg total) by mouth every 8 (eight) hours as needed. Do not drive while taking pain medication. Patient not taking: Reported on 07/29/2024 01/27/24   Debby Olam POUR, NP    Current Outpatient Medications  Medication Sig Dispense Refill   aluminum-magnesium  hydroxide 200-200 MG/5ML suspension Take 10 mLs by mouth every 6 (six) hours as needed for indigestion.     dexamethasone  (DECADRON ) 4 MG tablet Take 1 tablet (4 mg total) by mouth daily. Take in morning 30 tablet 1   metoCLOPramide  (REGLAN ) 10 MG tablet Take 1 tablet (10 mg total) by mouth every 6 (six) hours. 120 tablet 0   pantoprazole  (PROTONIX ) 40 MG tablet Take 1 tablet (40 mg total) by mouth 2 (two) times daily. 60 tablet 2   dexamethasone  (DECADRON ) 4 MG tablet Take 1 tablet (4 mg total) by mouth daily. Take one daily for 3 days beginning day after each chemotherapy for nausea prevention (Patient not taking: No sig reported) 6 tablet 2   lidocaine -prilocaine  (EMLA ) cream Apply 1 Application topically as directed. Apply 1/2 tablespoon to port site 1-2 hours prior to stick and cover with Press-and-Seal to numb port site. Do not start until 14 days of port placement. 30 g 3   LORazepam  (ATIVAN ) 0.5 MG tablet Place 1 tablet (0.5 mg total) under the tongue every 6 (six) hours as needed (nausea). (Patient not taking: Reported on 11/15/2024) 60 tablet 0   OLANZapine  (ZYPREXA ) 5 MG tablet Take 1 tablet (5 mg total) by mouth as directed. Take 5 mg night before each chemotherapy and then daily x 3 days after  each chemo treatment beginning day after each treatment (Patient not taking: No sig reported) 12 tablet 2   ondansetron  (ZOFRAN ) 8 MG tablet Take 1 tablet (8 mg total) by mouth every 8 (eight) hours as needed for nausea. May start 72 hours after chemotherapy treatment given due to receiving Aloxi  on day 1 (Patient not taking: No sig reported) 30 tablet 1   prochlorperazine  (COMPAZINE ) 25 MG suppository Place 1 suppository (25 mg total) rectally every 12 (twelve) hours as needed for nausea or vomiting. (Patient not taking: No sig reported) 12 suppository 0   scopolamine  (TRANSDERM-SCOP) 1 MG/3DAYS Place 1 patch (1 mg total) onto the skin as directed. Apply patch to skin morning of each chemotherapy and remove after 3 days (Patient not taking: No sig reported) 2 patch 2   traMADol  (ULTRAM ) 50 MG tablet Take 1 tablet (50 mg total) by mouth every 8 (eight) hours as needed. Do not drive  while taking pain medication. (Patient not taking: Reported on 07/29/2024) 30 tablet 0   Current Facility-Administered Medications  Medication Dose Route Frequency Provider Last Rate Last Admin   0.9 %  sodium chloride  infusion  500 mL Intravenous Once Charlanne Groom, MD       Facility-Administered Medications Ordered in Other Visits  Medication Dose Route Frequency Provider Last Rate Last Admin   sodium chloride  flush (NS) 0.9 % injection 10 mL  10 mL Intravenous PRN Cloretta Arley NOVAK, MD   10 mL at 02/16/24 1129    Allergies as of 11/18/2024 - Review Complete 11/18/2024  Allergen Reaction Noted   Fluorouracil  Other (See Comments) 02/24/2024    Family History  Problem Relation Age of Onset   Cancer Mother        type unknown   Diabetes Mellitus II Neg Hx    Colon cancer Neg Hx    Esophageal cancer Neg Hx    Rectal cancer Neg Hx    Stomach cancer Neg Hx     Social History   Socioeconomic History   Marital status: Married    Spouse name: Not on file   Number of children: 3   Years of education: Not on file    Highest education level: Not on file  Occupational History   Not on file  Tobacco Use   Smoking status: Former    Current packs/day: 0.00    Types: Cigarettes    Quit date: 1986    Years since quitting: 39.9    Passive exposure: Never   Smokeless tobacco: Never  Vaping Use   Vaping status: Never Used  Substance and Sexual Activity   Alcohol use: Yes    Comment: occasional   Drug use: Never   Sexual activity: Yes  Other Topics Concern   Not on file  Social History Narrative   Not on file   Social Drivers of Health   Financial Resource Strain: Not on file  Food Insecurity: Food Insecurity Present (03/03/2024)   Hunger Vital Sign    Worried About Running Out of Food in the Last Year: Sometimes true    Ran Out of Food in the Last Year: Sometimes true  Transportation Needs: No Transportation Needs (02/05/2024)   PRAPARE - Administrator, Civil Service (Medical): No    Lack of Transportation (Non-Medical): No  Physical Activity: Not on file  Stress: Not on file  Social Connections: Unknown (04/30/2022)   Received from [facility]   Social Network    Social Network: Not on file  Intimate Partner Violence: Not At Risk (02/05/2024)   Humiliation, Afraid, Rape, and Kick questionnaire    Fear of Current or Ex-Partner: No    Emotionally Abused: No    Physically Abused: No    Sexually Abused: No    Review of Systems: All other review of systems negative except as mentioned in the HPI.  Physical Exam: Vital signs in last 24 hours: @VSRANGES @   General:   Alert,  Well-developed, well-nourished, pleasant and cooperative in NAD Lungs:  Clear throughout to auscultation.   Heart:  Regular rate and rhythm; no murmurs, clicks, rubs,  or gallops. Abdomen:  Soft, nontender and nondistended. Normal bowel sounds.   Neuro/Psych:  Alert and cooperative. Normal mood and affect. A and O x 3    No significant changes were identified.  The patient continues to be an  appropriate candidate for the planned procedure and anesthesia.   Anselm Charlanne, MD. [facility] 11/18/2024 8:45 AM@

## 2024-11-18 NOTE — Telephone Encounter (Signed)
 Inbound call from patient wife stating that her husband advised her that he was already throwing up coffee ground material before his procedure today. Patient wife is requesting a call back. Please advise.

## 2024-11-18 NOTE — Telephone Encounter (Signed)
 Pts wife called stating pt was vomiting. Wife requesting advise for care. Please advise.

## 2024-11-18 NOTE — Op Note (Addendum)
 [facility] Patient Name: Henry May Procedure Date: 11/18/2024 8:40 AM MRN: [id number] Endoscopist: Lynnie Bring , MD, [id number] Age: 58 Referring MD:  Date of Birth: 02/02/1966 Gender: Male Account #: [id number] Procedure:                Upper GI endoscopy Indications:              Known gastric CA now with nausea/vomiting. UGI                            series showing narrowing of the gastric antrum with                            dilated small bowel loops. No obvious obstruction. Medicines:                Monitored Anesthesia Care Procedure:                Pre-Anesthesia Assessment:                           - Prior to the procedure, a History and Physical                            was performed, and patient medications and                            allergies were reviewed. The patient's tolerance of                            previous anesthesia was also reviewed. The risks                            and benefits of the procedure and the sedation                            options and risks were discussed with the patient.                            All questions were answered, and informed consent                            was obtained. Prior Anticoagulants: The patient has                            taken no anticoagulant or antiplatelet agents. ASA                            Grade Assessment: III - A patient with severe                            systemic disease. After reviewing the risks and                            benefits, the patient was deemed in satisfactory  condition to undergo the procedure.                           After obtaining informed consent, the endoscope was                            passed under direct vision. Throughout the                            procedure, the patient's blood pressure, pulse, and                            oxygen  saturations were monitored continuously. The                             GIF [id number] #[id number] was introduced through the                            mouth, and advanced to the third part of duodenum.                            The upper GI endoscopy was accomplished without                            difficulty. The patient tolerated the procedure                            well. Scope In: Scope Out: Findings:                 LA Grade D (one or more mucosal breaks involving at                            least 75% of esophageal circumference) esophagitis                            with no bleeding was found in the middle and distal                            esophagus. Biopsies were taken with a cold forceps                            for histology.                           A small hiatal hernia was present.                           A large, infiltrative, sessile and ulcerated,                            circumferential mass with contact bleeding was                            found in the gastric body and in  the gastric                            antrum. There was antral narrowing with luminal                            diameter of approximately 1.5 cm. The scope could                            easily be passed beyond. Pylorus was normal.                            Biopsies were taken with a cold forceps for                            histology.                           Deep duodenal intubation was performed up to distal                            third portion of the duodenum. The duodenum was                            dilated with significant retained bile/fluid. This                            was aspirated. No obvious underlying masses or                            obstructive lesions were noted in the duodenum. Complications:            No immediate complications. Estimated Blood Loss:     Estimated blood loss was minimal. Impression:               - LA Grade D reflux esophagitis with no bleeding.                            Biopsied.                            - Small hiatal hernia.                           - Malignant infiltrative gastric tumor in the                            gastric body and in the gastric antrum with antral                            narrowing. The tumor has definitely progressed as                            compared to previous EGD. Biopsied.                           -  Dilated duodenum without any obvious endoscopic                            evidence of masses/obstruction. Recommendation:           - Patient has a contact number available for                            emergencies. The signs and symptoms of potential                            delayed complications were discussed with the                            patient. Return to normal activities tomorrow.                            Written discharge instructions were provided to the                            patient.                           - Full liq diet. Can have Ensure 1 can p.o. 3 times                            daily.                           - I do not believe gastric stenting will be helpful                            at this time (may need it in future). I have                            reviewed the upper GI with small bowel series                            performed recently. There does not seem to be any                            mechanical obstruction amenable to stenting in the                            small bowel either. I will run it by Dr. Wilhelmenia.                           - Omeprazole  40 mg p.o. twice daily #180,2RF.                            Please open the capsule and take it with applesauce.                           - Carafate elixir 1 g p.o.  4 times daily x 4 weeks,                            4RF                           - Await pathology results.                           - The findings and recommendations were discussed                            with the patient's family. Will discuss with Dr.                             Cloretta and Dr Wilhelmenia. Lynnie Bring, MD 11/18/2024 9:20:59 AM This report has been signed electronically.

## 2024-11-18 NOTE — Telephone Encounter (Signed)
 Spoke with Dr. Charlanne about wife's return call after speaking with the nurse. He will call her back shortly to discuss her concerns.

## 2024-11-18 NOTE — Progress Notes (Signed)
 0743 Robinul 0.1 mg IV given due large amount of secretions upon assessment. Patient experiencing nausea.  Zofran  4 mg IV given. Simethicone  133 mg per 2 cc given with 3 cc of H20 per Dr Charlanne request and MD made aware of other meds.  vss

## 2024-11-18 NOTE — Patient Instructions (Signed)
 YOU HAD AN ENDOSCOPIC PROCEDURE TODAY AT THE [facility]:   Refer to the procedure report that was given to you for any specific questions about what was found during the examination.  If the procedure report does not answer your questions, please call your gastroenterologist to clarify.  If you requested that your care partner not be given the details of your procedure findings, then the procedure report has been included in a sealed envelope for you to review at your convenience later.  YOU SHOULD EXPECT: Some feelings of bloating in the abdomen. Passage of more gas than usual.  Walking can help get rid of the air that was put into your GI tract during the procedure and reduce the bloating. If you had a lower endoscopy (such as a colonoscopy or flexible sigmoidoscopy) you may notice spotting of blood in your stool or on the toilet paper. If you underwent a bowel prep for your procedure, you may not have a normal bowel movement for a few days.  Please Note:  You might notice some irritation and congestion in your nose or some drainage.  This is from the oxygen  used during your procedure.  There is no need for concern and it should clear up in a day or so.  SYMPTOMS TO REPORT IMMEDIATELY:   Following upper endoscopy (EGD)  Vomiting of blood or coffee ground material  New chest pain or pain under the shoulder blades  Painful or persistently difficult swallowing  New shortness of breath  Fever of 100F or higher  Black, tarry-looking stools  Full liquid diet Can have Ensure - 1 can  3 times a day Omeprazole  - 40 mg by mouth twice daily Please open the capsule and take it with applesauce Carafate elixir - 1 gram by mouth 4 times daily for 4 weeks Await pathology results   For urgent or emergent issues, a gastroenterologist can be reached at any hour by calling [phone]. Do not use MyChart messaging for urgent concerns.    DIET:  We do recommend a small meal at first, but  then you may proceed to your regular diet.  Drink plenty of fluids but you should avoid alcoholic beverages for 24 hours.  ACTIVITY:  You should plan to take it easy for the rest of today and you should NOT DRIVE or use heavy machinery until tomorrow (because of the sedation medicines used during the test).    FOLLOW UP: Our staff will call the number listed on your records the next business day following your procedure.  We will call around 7:15- 8:00 am to check on you and address any questions or concerns that you may have regarding the information given to you following your procedure. If we do not reach you, we will leave a message.     If any biopsies were taken you will be contacted by phone or by letter within the next 1-3 weeks.  Please call us  at [phone] if you have not heard about the biopsies in 3 weeks.    SIGNATURES/CONFIDENTIALITY: You and/or your care partner have signed paperwork which will be entered into your electronic medical record.  These signatures attest to the fact that that the information above on your After Visit Summary has been reviewed and is understood.  Full responsibility of the confidentiality of this discharge information lies with you and/or your care-partner.

## 2024-11-18 NOTE — Telephone Encounter (Signed)
 Spoke with Dr. Charlanne. He said due to the biopsies we did today that its expected for him to have Coffee ground emesis for the next 48 hours. Relayed this information to the patient's wife. She verbalized understanding.

## 2024-11-18 NOTE — Progress Notes (Unsigned)
 Report given to PACU, vss

## 2024-11-18 NOTE — Telephone Encounter (Signed)
 Dr. Charlanne made aware and will call wife

## 2024-11-18 NOTE — Telephone Encounter (Signed)
 Discussed with patient's wife in detail. Very unfortunate situation. 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  Also has grade D esophagitis. No  mechanical outlet obstruction currently.  dilated loops of small bowel ?etiology.  UGI with SB series  confirms above.  Patient had hematemesis/CGE  prior to EGD and again after EGD.  Expected. Bx send rush. Should be back tomorrow. Will check on pt again tomorrow Unfortunately, very limited endoscopic therapeutic interventions for malignant bleeding-  hoping omeprazole  and Carafate would help. Discussed with patient's wife. If bleeding becomes significant,  needs to come over to ED  RG

## 2024-11-18 NOTE — Telephone Encounter (Signed)
 I spoke on the telephone with Henry May's wife this evening regarding symptoms of persistent nausea, vomiting, coffee-ground emesis and weakness status post EGD this morning for an indication of known gastric cancer with nausea and vomiting.  EGD showed LA grade D esophagitis, hiatal hernia, gastric mass in antrum and body that was not causing overt obstruction.  Duodenum was deeply intubated.  Henry May has had difficulty tolerating soup and other liquids that he has attempted to drink today.  It has been difficult for him to keep fluids down.  Emesis has had a coffee ground characteristic which is likely related to gastric cancer and biopsies performed today.  He is endorsing weakness.  His wife has been providing him with PPI and Carafate .  His medication list also includes scopolamine  patch, Reglan  and ondansetron .  Reviewed these other measures that can be utilized for nausea and vomiting.  Reviewed signs and symptoms of dehydration with his wife.  Advised that if he is becoming more weak or having symptoms of dehydration it may be reasonable to go to the emergency room for IV fluid hydration and IV antiemetics.  Wife will continue to monitor him for now but consider ER evaluation if symptoms do not improve throughout the night.  I will request that our office follow-up with his wife tomorrow to reassess how he is doing.

## 2024-11-18 NOTE — Telephone Encounter (Signed)
 Inbound call from patient wife stating that he husband is throwing up coffee ground material. Patient wife is requesting a call back.

## 2024-11-19 ENCOUNTER — Telehealth: Payer: Self-pay

## 2024-11-19 ENCOUNTER — Inpatient Hospital Stay

## 2024-11-19 VITALS — BP 133/87 | HR 91 | Temp 99.5°F | Resp 18

## 2024-11-19 DIAGNOSIS — E860 Dehydration: Secondary | ICD-10-CM

## 2024-11-19 DIAGNOSIS — C162 Malignant neoplasm of body of stomach: Secondary | ICD-10-CM | POA: Diagnosis not present

## 2024-11-19 LAB — SURGICAL PATHOLOGY

## 2024-11-19 MED ORDER — SODIUM CHLORIDE 0.9 % IV SOLN: Freq: Once | INTRAVENOUS | Status: AC

## 2024-11-19 NOTE — Telephone Encounter (Signed)
  Follow up Call-     11/18/2024    7:25 AM 12/31/2023    9:30 AM  Call back number  Post procedure Call Back phone  # [phone] wifes cell [phone]  Permission to leave phone message Yes Yes     Patient questions:  Do you have a fever, pain , or abdominal swelling? No. Pain Score  0 *  Have you tolerated food without any problems? No.  Have you been able to return to your normal activities? No.  Do you have any questions about your discharge instructions: Diet   No. Medications  No. Follow up visit  No.  Do you have questions or concerns about your Care? No.  Actions: * If pain score is 4 or above: No action needed, pain <4.

## 2024-11-19 NOTE — Telephone Encounter (Signed)
 Bernardo Particia CROME, RN to [place] (Emergency Contact)     11/19/24  7:47 AM Wife stated Henry May is still having same symptoms as before his upper endoscopy. They were on their way to his scheduled IV fluid infusion. Advised wife to contact us  if symptoms worsen or if have any other questions or concerns.

## 2024-11-19 NOTE — Patient Instructions (Signed)

## 2024-11-19 NOTE — Telephone Encounter (Signed)
 Patient does feel better today.  Went to cancer center for IV fluids  discussed biopsy results as well with the patient's wife in detail. 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 Deanne on Monday

## 2024-11-22 ENCOUNTER — Encounter: Payer: Self-pay | Admitting: Oncology

## 2024-11-22 ENCOUNTER — Other Ambulatory Visit: Payer: Self-pay | Admitting: *Deleted

## 2024-11-22 ENCOUNTER — Inpatient Hospital Stay: Admitting: Oncology

## 2024-11-22 ENCOUNTER — Inpatient Hospital Stay

## 2024-11-22 VITALS — BP 129/96 | HR 91 | Temp 96.9°F | Resp 16 | Wt 163.8 lb

## 2024-11-22 DIAGNOSIS — C162 Malignant neoplasm of body of stomach: Secondary | ICD-10-CM

## 2024-11-22 DIAGNOSIS — E860 Dehydration: Secondary | ICD-10-CM

## 2024-11-22 DIAGNOSIS — C169 Malignant neoplasm of stomach, unspecified: Secondary | ICD-10-CM

## 2024-11-22 MED ORDER — SODIUM CHLORIDE 0.9 % IV SOLN: INTRAVENOUS | Status: AC

## 2024-11-22 MED ORDER — SCOPOLAMINE 1 MG/3DAYS TD PT72: 1.0000 | MEDICATED_PATCH | TRANSDERMAL | 2 refills | Status: DC | PRN

## 2024-11-22 NOTE — Progress Notes (Signed)
 [facility] OFFICE PROGRESS NOTE   Diagnosis: Gastric cancer  INTERVAL HISTORY:   Mr.Reist continues to have reflux symptoms.  He reports heartburn.  He saw Dr. Charlanne for an upper endoscopy 11/18/2024. He is tolerating some liquids. Objective:  Vital signs in last 24 hours:  Blood pressure (!) 129/96, pulse 91, temperature (!) 96.9 F (36.1 C), temperature source Temporal, resp. rate 16, weight 163 lb 12.8 oz (74.3 kg), SpO2 96%.    HEENT: No thrush or ulcers Resp: Lungs bilaterally Cardio: Regular rate and rhythm GI: Firm fullness in the upper abdomen, nontender Vascular: No leg edema   Portacath/PICC-without erythema  Lab Results:  Lab Results  Component Value Date   WBC 6.7 11/09/2024   HGB 14.1 11/09/2024   HCT 42.0 11/09/2024   MCV 89.4 11/09/2024   PLT 259 11/09/2024   NEUTROABS 4.5 11/09/2024    CMP  Lab Results  Component Value Date   NA 135 11/09/2024   K 4.2 11/09/2024   CL 97 (L) 11/09/2024   CO2 27 11/09/2024   GLUCOSE 114 (H) 11/09/2024   BUN 12 11/09/2024   CREATININE 1.03 11/09/2024   CALCIUM  9.8 11/09/2024   PROT 7.2 11/09/2024   ALBUMIN  4.3 11/09/2024   AST 25 11/09/2024   ALT 37 11/09/2024   ALKPHOS 99 11/09/2024   BILITOT 0.4 11/09/2024   GFRNONAA >60 11/09/2024   GFRAA >60 09/03/2020    Lab Results  Component Value Date   CEA 3.57 01/15/2024    Medications: I have reviewed the patient's current medications.   Assessment/Plan: Gastric cancer 11/21/2023 EGD-gastric body with infiltrative looking lesion; biopsy shows involvement of a hypercellular lesion that suggests diffuse carcinoma by morphology CTs abdomen/pelvis 11/28/2023-diffuse fatty infiltration of the liver; gastric wall thickening; lymph nodes up to 6 mm in diameter near the stomach wall. 12/31/2023 upper endoscopy-large diffuse friable infiltrative polypoid and ulcerated circumferential mass found in the gastric body.  Scope was passed beyond the  mass with normal antrum/pylorus.  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  Negative for HER2 (1+); mismatch repair protein IHC normal; PD-L1 CPS 0%; CLDN18 positive: 90% of tumor cells with 2+/3+ membrane staining PET scan 01/07/2024-circumferential hypermetabolic gastric mucosal thickening.  Ill-defined hypermetabolic lymph nodes in the gastrohepatic ligament.  Intense hypermetabolic activity associated with the right lobe of the thyroid  gland.  Small hypermetabolic right axillary node favored reactive. 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  Gastric wall thickening slightly improved. Cycle 7 oxaliplatin , zolbetuximab 05/05/2024, Fulphila  Cycle 8 zolbetuximab 05/20/2024, oxaliplatin  held due to neuropathy, no Fulphila  Cycle 9 oxaliplatin /zolbetuximab 06/03/2024, Fulphila  Cycle 10 oxaliplatin /zolbetuximab 06/17/2024, Fulphila  Cycle 11 oxaliplatin /zolbetuximab 06/30/2024, Fulphila , oxaliplatin  dose reduced Cycle 12 zolbetuximab 07/15/2024, oxaliplatin  held secondary to neuropathy Cycle 13 zolbetuximab 07/29/2024, oxaliplatin  held secondary to neuropathy Cycle 14 zolbetuximab 08/26/2024, oxaliplatin  held secondary to neuropathy 09/01/2024 CTs-mild distal gastric wall thickening without discrete mass.  No metastatic disease. Cycle 15  zolbetuximab 09/09/2024, oxaliplatin  held due to neuropathy Cycle 16 zolbetuximab 09/30/2024, oxaliplatin  held due to neuropathy Cycle 17 zolbetuximab 10/21/2024, oxaliplatin   held due to neuropathy 11/03/2024 CT abdomen/pelvis: Diffuse mild wall thickening of the distal gastric body and antrum-stable, mild increase in a right gastric lymph node small volume ascites without omental nodularity, proximal jejunal mural thickening 11/04/2024 upper GI: Narrowing of the gastric antrum, normal gastric emptying, dilated duodenum and proximal jejunum without evidence of a bowel obstruction 11/18/2024 upper endoscopy: Tumor in the gastric body and antrum with antral narrowing, dilated duodenum without evidence of mass or obstruction, esophagitis: Biopsy of the gastric mass-poorly differentiated adenocarcinoma with focal signet ring morphology, esophagus biopsy: Squamous mucosa with focal ulceration Early satiety, postprandial abdominal pain, weight loss secondary to #1 History of bilateral lower extremity DVT 2021-anticoagulation discontinued due to massive retroperitoneal bleed, IVC filter placed 08/24/2020 Hospitalized with acute respiratory failure due to COVID-19 08/06/2020 - 09/04/2020 History of massive retroperitoneal bleed secondary to anticoagulation September 2021 Thyroid  ultrasound 01/13/2024-no abnormal nodule identified in the right lobe.  1.1 cm thyroid  isthmus nodule meets criteria for 1 year follow-up ultrasound. Admission 02/01/2024 with increased abdominal pain/ascites Hospitalization with altered mental status 02/05/2024 through 02/13/2024; question rare case of 5-FU psychosis.  Improved 02/16/2024.  Mental status at baseline 02/23/2024. Neutropenia 02/16/2024 Mucositis 02/16/2024.  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  Persistent  cold sensitivity 05/20/2024, oxaliplatin  held.  Cold sensitivity resolved 06/03/2024, oxaliplatin  resumed.  Mild loss of vibratory sense, oxaliplatin  dose reduced 06/29/2024    Disposition: Mr Bring gastric cancer.  He is symptomatic with nausea and reflux symptoms.  He is taking antiacid therapy and antiemetics.  He continues to lose weight.  I discussed the endoscopic findings and treatment options with Mr. Gulino and his wife.  They understand his symptoms are related to progression of the tumor in his stomach.  We discussed resuming systemic therapy and palliative radiation.  I recommend systemic therapy.  He had a good response to oxaliplatin  therapy earlier this year.  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  He does not appear to be a candidate for the paclitaxel/ramucirumab/nivolumab study due to the low PD-L1 score.  He has a borderline performance status at present.  I recommend a trial of weekly cisplatin.  We reviewed the potential toxicities associated with cisplatin including the chance of hair loss, nausea, neuropathy, and renal toxicity.  He agrees to proceed.  The plan at is to begin weekly cisplatin chemotherapy on 11/26/2024.  He will return for an office visit and week #2 cisplatin on 12/02/2024.  We will consider paclitaxel/ramucirumab if he does not respond to the cisplatin.  I encouraged him to increase his fluid and calorie intake as tolerated.  A treatment plan was entered today.  Arley Hof, MD  11/22/2024  10:11 AM

## 2024-11-22 NOTE — Progress Notes (Signed)
 DISCONTINUE ON PATHWAY REGIMEN - Gastroesophageal     Cycles 1: A cycle is 14 days:     Zolbetuximab-clzb       Oxaliplatin       Leucovorin       Fluorouracil       Fluorouracil     Cycles 2 through 12: A cycle is every 14 days:     Zolbetuximab-clzb       Oxaliplatin       Leucovorin       Fluorouracil       Fluorouracil     Cycles 13 and beyond: A cycle is every 14 days:     Zolbetuximab-clzb       Leucovorin       Fluorouracil       Fluorouracil    **Always confirm dose/schedule in your pharmacy ordering system**  PRIOR TREATMENT: [id number]: Zolbetuximab 800/400 mg/m2 + mFOLFOX6 q14 Days  START OFF PATHWAY REGIMEN - Gastroesophageal   [id number]:Cisplatin 35 mg/m2 IV D1 q7 Days + RT:   A cycle is every 7 days, concurrent with RT:     Cisplatin   **Always confirm dose/schedule in your pharmacy ordering system**  Patient Characteristics: Distant Metastases (cM1/pM1) / Locally Recurrent Disease, Adenocarcinoma - Esophageal, GE Junction, and Gastric, Second Line, HER2 Negative/Unknown, and MSS/pMMR or  MSI Unknown Disease Classification: Gastric Histology: Adenocarcinoma Therapeutic Status: Distant Metastases (No Additional Staging) Line of Therapy: Second Line Microsatellite/Mismatch Repair Status: MSS/pMMR HER2 Status: Negative Intent of Therapy: Non-Curative / Palliative Intent, Discussed with Patient

## 2024-11-22 NOTE — Patient Instructions (Signed)

## 2024-11-23 ENCOUNTER — Other Ambulatory Visit: Payer: Self-pay | Admitting: Nurse Practitioner

## 2024-11-23 LAB — MISC LABCORP TEST (SEND OUT): Labcorp test code: 511176

## 2024-11-24 ENCOUNTER — Ambulatory Visit: Payer: Self-pay | Admitting: Gastroenterology

## 2024-11-24 ENCOUNTER — Inpatient Hospital Stay

## 2024-11-24 VITALS — BP 137/88 | HR 73 | Temp 96.7°F | Resp 16

## 2024-11-24 DIAGNOSIS — C162 Malignant neoplasm of body of stomach: Secondary | ICD-10-CM | POA: Diagnosis not present

## 2024-11-24 DIAGNOSIS — C169 Malignant neoplasm of stomach, unspecified: Secondary | ICD-10-CM

## 2024-11-24 LAB — BASIC METABOLIC PANEL - CANCER CENTER ONLY
Anion gap: 13 (ref 5–15)
BUN: 26 mg/dL — ABNORMAL HIGH (ref 6–20)
CO2: 27 mmol/L (ref 22–32)
Calcium: 9.7 mg/dL (ref 8.9–10.3)
Chloride: 99 mmol/L (ref 98–111)
Creatinine: 0.9 mg/dL (ref 0.61–1.24)
GFR, Estimated: >60 mL/min (ref >60)
Glucose, Bld: 113 mg/dL — ABNORMAL HIGH (ref 70–99)
Potassium: 3.8 mmol/L (ref 3.5–5.1)
Sodium: 139 mmol/L (ref 135–145)

## 2024-11-24 LAB — CBC WITH DIFFERENTIAL (CANCER CENTER ONLY)
Abs Immature Granulocytes: 0.02 K/uL (ref 0.00–0.07)
Basophils Absolute: 0 K/uL (ref 0.0–0.1)
Basophils Relative: 0 %
Eosinophils Absolute: 0.1 K/uL (ref 0.0–0.5)
Eosinophils Relative: 2 %
HCT: 42.5 % (ref 39.0–52.0)
Hemoglobin: 14.6 g/dL (ref 13.0–17.0)
Immature Granulocytes: 0 %
Lymphocytes Relative: 18 %
Lymphs Abs: 1.1 K/uL (ref 0.7–4.0)
MCH: 29.9 pg (ref 26.0–34.0)
MCHC: 34.4 g/dL (ref 30.0–36.0)
MCV: 86.9 fL (ref 80.0–100.0)
Monocytes Absolute: 0.8 K/uL (ref 0.1–1.0)
Monocytes Relative: 13 %
Neutro Abs: 4.2 K/uL (ref 1.7–7.7)
Neutrophils Relative %: 67 %
Platelet Count: 254 K/uL (ref 150–400)
RBC: 4.89 MIL/uL (ref 4.22–5.81)
RDW: 13.3 % (ref 11.5–15.5)
WBC Count: 6.2 K/uL (ref 4.0–10.5)
nRBC: 0 % (ref 0.0–0.2)

## 2024-11-24 LAB — MAGNESIUM: Magnesium: 2.4 mg/dL (ref 1.7–2.4)

## 2024-11-24 MED ORDER — SODIUM CHLORIDE 0.9 % IV SOLN: Freq: Once | INTRAVENOUS | Status: AC

## 2024-11-24 NOTE — Patient Instructions (Signed)

## 2024-11-26 ENCOUNTER — Inpatient Hospital Stay

## 2024-11-26 ENCOUNTER — Encounter: Payer: Self-pay | Admitting: *Deleted

## 2024-11-26 ENCOUNTER — Encounter: Payer: Self-pay | Admitting: Nurse Practitioner

## 2024-11-26 ENCOUNTER — Other Ambulatory Visit: Payer: Self-pay | Admitting: Oncology

## 2024-11-26 ENCOUNTER — Inpatient Hospital Stay: Admitting: Nurse Practitioner

## 2024-11-26 VITALS — BP 150/88 | HR 69 | Temp 98.2°F | Resp 16

## 2024-11-26 VITALS — BP 129/92 | HR 92 | Temp 97.8°F | Resp 18 | Ht 72.0 in | Wt 158.1 lb

## 2024-11-26 DIAGNOSIS — C169 Malignant neoplasm of stomach, unspecified: Secondary | ICD-10-CM

## 2024-11-26 LAB — BASIC METABOLIC PANEL - CANCER CENTER ONLY
Anion gap: 13 (ref 5–15)
BUN: 24 mg/dL — ABNORMAL HIGH (ref 6–20)
CO2: 26 mmol/L (ref 22–32)
Calcium: 10.1 mg/dL (ref 8.9–10.3)
Chloride: 99 mmol/L (ref 98–111)
Creatinine: 0.83 mg/dL (ref 0.61–1.24)
GFR, Estimated: >60 mL/min (ref >60)
Glucose, Bld: 105 mg/dL — ABNORMAL HIGH (ref 70–99)
Potassium: 3.7 mmol/L (ref 3.5–5.1)
Sodium: 138 mmol/L (ref 135–145)

## 2024-11-26 MED ORDER — MAGNESIUM SULFATE 2 GM/50ML IV SOLN
2.0000 g | Freq: Once | INTRAVENOUS | Status: AC
  Administered 2024-11-26: 2 g via INTRAVENOUS
  Filled 2024-11-26: qty 50

## 2024-11-26 MED ORDER — SODIUM CHLORIDE 0.9 % IV SOLN: INTRAVENOUS | Status: DC

## 2024-11-26 MED ORDER — SODIUM CHLORIDE 0.9 % IV SOLN
35.0000 mg/m2 | Freq: Once | INTRAVENOUS | Status: AC
  Administered 2024-11-26: 67 mg via INTRAVENOUS
  Filled 2024-11-26: qty 17

## 2024-11-26 MED ORDER — POTASSIUM CHLORIDE IN NACL 20-0.9 MEQ/L-% IV SOLN
Freq: Once | INTRAVENOUS | Status: AC
  Filled 2024-11-26: qty 1000

## 2024-11-26 MED ORDER — PALONOSETRON HCL INJECTION 0.25 MG/5ML
0.2500 mg | Freq: Once | INTRAVENOUS | Status: AC
  Administered 2024-11-26: 0.25 mg via INTRAVENOUS
  Filled 2024-11-26: qty 5

## 2024-11-26 MED ORDER — SODIUM CHLORIDE 0.9 % IV SOLN
150.0000 mg | Freq: Once | INTRAVENOUS | Status: AC
  Administered 2024-11-26: 150 mg via INTRAVENOUS
  Filled 2024-11-26: qty 150

## 2024-11-26 MED ORDER — DEXAMETHASONE SOD PHOSPHATE PF 10 MG/ML IJ SOLN
10.0000 mg | Freq: Once | INTRAMUSCULAR | Status: AC
  Administered 2024-11-26: 10 mg via INTRAVENOUS

## 2024-11-26 MED ORDER — FAMOTIDINE 20 MG PO TABS
20.0000 mg | ORAL_TABLET | Freq: Once | ORAL | Status: AC
  Administered 2024-11-26: 20 mg via ORAL
  Filled 2024-11-26: qty 1

## 2024-11-26 NOTE — Progress Notes (Signed)
 Per Dr. Cloretta on 11/24/24: OK to treat on 11/26/24 based on labs of 11/24/24

## 2024-11-26 NOTE — Patient Instructions (Addendum)
 [facility] - A DEPT OF [facility]  Discharge Instructions: Thank you for choosing [facility] to provide your oncology and hematology care.   If you have a lab appointment with the Cancer Center, please go directly to the Cancer Center and check in at the registration area.   Wear comfortable clothing and clothing appropriate for easy access to any Portacath or PICC line.   We strive to give you quality time with your provider. You may need to reschedule your appointment if you arrive late (15 or more minutes).  Arriving late affects you and other patients whose appointments are after yours.  Also, if you miss three or more appointments without notifying the office, you may be dismissed from the clinic at the providers discretion.      For prescription refill requests, have your pharmacy contact our office and allow 72 hours for refills to be completed.    Today you received the following chemotherapy and/or immunotherapy agents cisplatin.      To help prevent nausea and vomiting after your treatment, we encourage you to take your nausea medication as directed.  BELOW ARE SYMPTOMS THAT SHOULD BE REPORTED IMMEDIATELY: *FEVER GREATER THAN 100.4 F (38 C) OR HIGHER *CHILLS OR SWEATING *NAUSEA AND VOMITING THAT IS NOT CONTROLLED WITH YOUR NAUSEA MEDICATION *UNUSUAL SHORTNESS OF BREATH *UNUSUAL BRUISING OR BLEEDING *URINARY PROBLEMS (pain or burning when urinating, or frequent urination) *BOWEL PROBLEMS (unusual diarrhea, constipation, pain near the anus) TENDERNESS IN MOUTH AND THROAT WITH OR WITHOUT PRESENCE OF ULCERS (sore throat, sores in mouth, or a toothache) UNUSUAL RASH, SWELLING OR PAIN  UNUSUAL VAGINAL DISCHARGE OR ITCHING   Items with * indicate a potential emergency and should be followed up as soon as possible or go to the Emergency Department if any problems should occur.  Please show the CHEMOTHERAPY ALERT CARD or IMMUNOTHERAPY  ALERT CARD at check-in to the Emergency Department and triage nurse.  Should you have questions after your visit or need to cancel or reschedule your appointment, please contact [facility] - A DEPT OF [facility]  Dept: [phone]  and follow the prompts.  Office hours are 8:00 a.m. to 4:30 p.m. Monday - Friday. Please note that voicemails left after 4:00 p.m. may not be returned until the following business day.  We are closed weekends and major holidays. You have access to a nurse at all times for urgent questions. Please call the main number to the clinic Dept: [phone] and follow the prompts.   For any non-urgent questions, you may also contact your provider using MyChart. We now offer e-Visits for anyone 33 and older to request care online for non-urgent symptoms. For details visit [email or web address].   Also download the MyChart app! Go to the app store, search MyChart, open the app, select [place], and log in with your MyChart username and password.  [facility] - A DEPT OF [facility]  Discharge Instructions: Thank you for choosing [facility] to provide your oncology and hematology care.   If you have a lab appointment with the Cancer Center, please go directly to the Cancer Center and check in at the registration area.   Wear comfortable clothing and clothing appropriate for easy access to any Portacath or PICC line.   We strive to give you quality time with your provider. You may need to reschedule your appointment if you arrive  late (15 or more minutes).  Arriving late affects you and other patients whose appointments are after yours.  Also, if you miss three or more appointments without notifying the office, you may be dismissed from the clinic at the providers discretion.      For prescription refill requests, have your pharmacy contact our office and allow 72 hours for refills to be  completed.    Today you received the following chemotherapy and/or immunotherapy agents cisplatin      To help prevent nausea and vomiting after your treatment, we encourage you to take your nausea medication as directed.  BELOW ARE SYMPTOMS THAT SHOULD BE REPORTED IMMEDIATELY: *FEVER GREATER THAN 100.4 F (38 C) OR HIGHER *CHILLS OR SWEATING *NAUSEA AND VOMITING THAT IS NOT CONTROLLED WITH YOUR NAUSEA MEDICATION *UNUSUAL SHORTNESS OF BREATH *UNUSUAL BRUISING OR BLEEDING *URINARY PROBLEMS (pain or burning when urinating, or frequent urination) *BOWEL PROBLEMS (unusual diarrhea, constipation, pain near the anus) TENDERNESS IN MOUTH AND THROAT WITH OR WITHOUT PRESENCE OF ULCERS (sore throat, sores in mouth, or a toothache) UNUSUAL RASH, SWELLING OR PAIN  UNUSUAL VAGINAL DISCHARGE OR ITCHING   Items with * indicate a potential emergency and should be followed up as soon as possible or go to the Emergency Department if any problems should occur.  Please show the CHEMOTHERAPY ALERT CARD or IMMUNOTHERAPY ALERT CARD at check-in to the Emergency Department and triage nurse.  Should you have questions after your visit or need to cancel or reschedule your appointment, please contact [facility]  Dept: [phone]  and follow the prompts.  Office hours are 8:00 a.m. to 4:30 p.m. Monday - Friday. Please note that voicemails left after 4:00 p.m. may not be returned until the following business day.  We are closed weekends and major holidays. You have access to a nurse at all times for urgent questions. Please call the main number to the clinic Dept: [phone] and follow the prompts.   For any non-urgent questions, you may also contact your provider using MyChart. We now offer e-Visits for anyone 28 and older to request care online for non-urgent symptoms. For details visit [email or web address].   Also download the MyChart app! Go to the app  store, search MyChart, open the app, select [facility], and log in with your MyChart username and password.

## 2024-11-26 NOTE — Progress Notes (Addendum)
 Pt urinated 200 mls at 1245 before hanging of treatment

## 2024-11-26 NOTE — Progress Notes (Signed)
 [facility] OFFICE PROGRESS NOTE   Diagnosis: Gastric cancer  INTERVAL HISTORY:   [name] returns as scheduled.  He is seen prior to proceeding with cycle 1 weekly cisplatin.  He is weak.  He continues to have nausea with rare vomiting.  Oral intake is minimal due to the nausea.  Bowels are moving.  He denies pain.  He is interested in home TPN.  Objective:  Vital signs in last 24 hours:  Blood pressure (!) 129/92, pulse 92, temperature 97.8 F (36.6 C), resp. rate 18, height 6' (1.829 m), weight 158 lb 1.6 oz (71.7 kg), SpO2 100%.    HEENT: Mucous membranes appear moist.  No thrush or ulcers. Resp: Lungs clear bilaterally. Cardio: Regular rate and rhythm. GI: Firm fullness across the upper abdomen.  Nontender. Vascular: No leg edema. Skin: Skin turgor is intact. Port-A-Cath without erythema.  Lab Results:  Lab Results  Component Value Date   WBC 6.2 11/24/2024   HGB 14.6 11/24/2024   HCT 42.5 11/24/2024   MCV 86.9 11/24/2024   PLT 254 11/24/2024   NEUTROABS 4.2 11/24/2024    Imaging:  No results found.  Medications: I have reviewed the patient's current medications.  Assessment/Plan: Gastric cancer 11/21/2023 EGD-gastric body with infiltrative looking lesion; biopsy shows involvement of a hypercellular lesion that suggests diffuse carcinoma by morphology CTs abdomen/pelvis 11/28/2023-diffuse fatty infiltration of the liver; gastric wall thickening; lymph nodes up to 6 mm in diameter near the stomach wall. 12/31/2023 upper endoscopy-large diffuse friable infiltrative polypoid and ulcerated circumferential mass found in the gastric body.  Scope was passed beyond the mass with normal antrum/pylorus.  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  Negative for HER2 (1+); mismatch repair protein IHC normal; PD-L1 CPS 0%; CLDN18 positive: 90% of tumor cells with 2+/3+ membrane  staining PET scan 01/07/2024-circumferential hypermetabolic gastric mucosal thickening.  Ill-defined hypermetabolic lymph nodes in the gastrohepatic ligament.  Intense hypermetabolic activity associated with the right lobe of the thyroid  gland.  Small hypermetabolic right axillary node favored reactive. 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  Gastric wall thickening slightly improved. Cycle 7 oxaliplatin , zolbetuximab 05/05/2024, Fulphila  Cycle 8 zolbetuximab 05/20/2024, oxaliplatin  held due to neuropathy, no Fulphila  Cycle 9 oxaliplatin /zolbetuximab 06/03/2024, Fulphila  Cycle 10 oxaliplatin /zolbetuximab 06/17/2024, Fulphila  Cycle 11 oxaliplatin /zolbetuximab 06/30/2024, Fulphila , oxaliplatin  dose reduced Cycle 12 zolbetuximab 07/15/2024, oxaliplatin  held secondary to neuropathy Cycle 13 zolbetuximab 07/29/2024, oxaliplatin  held secondary to neuropathy Cycle 14 zolbetuximab 08/26/2024, oxaliplatin  held secondary to neuropathy 09/01/2024 CTs-mild distal gastric wall thickening without discrete mass.  No metastatic disease. Cycle 15 zolbetuximab 09/09/2024, oxaliplatin  held due to neuropathy Cycle 16 zolbetuximab 09/30/2024, oxaliplatin  held due to neuropathy Cycle 17 zolbetuximab 10/21/2024, oxaliplatin  held due to neuropathy 11/03/2024 CT abdomen/pelvis: Diffuse mild wall thickening of the distal gastric body and  antrum-stable, mild increase in a right gastric lymph node small volume ascites without omental nodularity, proximal jejunal mural thickening 11/04/2024 upper GI: Narrowing of the gastric antrum, normal gastric emptying, dilated duodenum and proximal jejunum without evidence of a bowel obstruction 11/18/2024 upper endoscopy:  Tumor in the gastric body and antrum with antral narrowing, dilated duodenum without evidence of mass or obstruction, esophagitis: Biopsy of the gastric mass-poorly differentiated adenocarcinoma with focal signet ring morphology, esophagus biopsy: Squamous mucosa with focal ulceration Cycle 1 weekly cisplatin 11/26/2024 Early satiety, postprandial abdominal pain, weight loss secondary to #1 History of bilateral lower extremity DVT 2021-anticoagulation discontinued due to massive retroperitoneal bleed, IVC filter placed 08/24/2020 Hospitalized with acute respiratory failure due to COVID-19 08/06/2020 - 09/04/2020 History of massive retroperitoneal bleed secondary to anticoagulation September 2021 Thyroid  ultrasound 01/13/2024-no abnormal nodule identified in the right lobe.  1.1 cm thyroid  isthmus nodule meets criteria for 1 year follow-up ultrasound. Admission 02/01/2024 with increased abdominal pain/ascites Hospitalization with altered mental status 02/05/2024 through 02/13/2024; question rare case of 5-FU psychosis.  Improved 02/16/2024.  Mental status at baseline 02/23/2024. Neutropenia 02/16/2024 Mucositis 02/16/2024.  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  Persistent cold sensitivity 05/20/2024, oxaliplatin  held.  Cold sensitivity resolved 06/03/2024, oxaliplatin  resumed.  Mild loss of vibratory sense, oxaliplatin  dose reduced 06/29/2024  Disposition: [name] appears unchanged.  He continues to have nausea and  reflux symptoms related to progressive gastric cancer.  He is scheduled to begin treatment today with weekly cisplatin.  We again reviewed potential toxicities.  He agrees to proceed.  Labs from 11/24/2024 and basic metabolic panel from today reviewed.  Adequate for treatment.  He and his wife are interested in TPN.  We will make a home health referral.  He will return for IV fluids 11/29/2024.  Follow-up and the next cycle of chemotherapy as scheduled.    Olam Ned ANP/GNP-BC   11/26/2024  9:30 AM

## 2024-11-26 NOTE — Progress Notes (Signed)
 Patient seen by Olam Ned NP today  Vitals are within treatment parameters:Yes   Labs are within treatment parameters: Will draw Bmet in tx area, otherwise labs from 12/10 are adequate   Treatment plan has been signed: Yes   Per physician team, Patient is ready for treatment and there are NO modifications to the treatment plan. May begin pre-hydration while awaiting Bmet results.

## 2024-11-27 ENCOUNTER — Emergency Department (HOSPITAL_BASED_OUTPATIENT_CLINIC_OR_DEPARTMENT_OTHER)

## 2024-11-27 ENCOUNTER — Other Ambulatory Visit: Payer: Self-pay

## 2024-11-27 ENCOUNTER — Encounter (HOSPITAL_BASED_OUTPATIENT_CLINIC_OR_DEPARTMENT_OTHER): Payer: Self-pay | Admitting: *Deleted

## 2024-11-27 ENCOUNTER — Inpatient Hospital Stay (HOSPITAL_BASED_OUTPATIENT_CLINIC_OR_DEPARTMENT_OTHER)
Admission: EM | Admit: 2024-11-27 | Discharge: 2025-01-05 | DRG: 374 | Disposition: A | Discharge status: Home / Self Care | Attending: Internal Medicine | Admitting: Internal Medicine

## 2024-11-27 DIAGNOSIS — R64 Cachexia: Secondary | ICD-10-CM | POA: Diagnosis present

## 2024-11-27 DIAGNOSIS — E869 Volume depletion, unspecified: Secondary | ICD-10-CM | POA: Diagnosis not present

## 2024-11-27 DIAGNOSIS — Z510 Encounter for antineoplastic radiation therapy: Secondary | ICD-10-CM

## 2024-11-27 DIAGNOSIS — Z8616 Personal history of COVID-19: Secondary | ICD-10-CM

## 2024-11-27 DIAGNOSIS — R112 Nausea with vomiting, unspecified: Secondary | ICD-10-CM | POA: Diagnosis present

## 2024-11-27 DIAGNOSIS — K226 Gastro-esophageal laceration-hemorrhage syndrome: Secondary | ICD-10-CM | POA: Diagnosis not present

## 2024-11-27 DIAGNOSIS — K2210 Ulcer of esophagus without bleeding: Secondary | ICD-10-CM | POA: Diagnosis not present

## 2024-11-27 DIAGNOSIS — R55 Syncope and collapse: Secondary | ICD-10-CM | POA: Diagnosis not present

## 2024-11-27 DIAGNOSIS — Z888 Allergy status to other drugs, medicaments and biological substances status: Secondary | ICD-10-CM

## 2024-11-27 DIAGNOSIS — I10 Essential (primary) hypertension: Secondary | ICD-10-CM | POA: Diagnosis present

## 2024-11-27 DIAGNOSIS — N3080 Other cystitis without hematuria: Secondary | ICD-10-CM | POA: Diagnosis present

## 2024-11-27 DIAGNOSIS — R7989 Other specified abnormal findings of blood chemistry: Secondary | ICD-10-CM | POA: Diagnosis present

## 2024-11-27 DIAGNOSIS — K920 Hematemesis: Secondary | ICD-10-CM | POA: Diagnosis present

## 2024-11-27 DIAGNOSIS — Z923 Personal history of irradiation: Secondary | ICD-10-CM

## 2024-11-27 DIAGNOSIS — D630 Anemia in neoplastic disease: Secondary | ICD-10-CM | POA: Diagnosis present

## 2024-11-27 DIAGNOSIS — C787 Secondary malignant neoplasm of liver and intrahepatic bile duct: Secondary | ICD-10-CM | POA: Diagnosis present

## 2024-11-27 DIAGNOSIS — K56609 Unspecified intestinal obstruction, unspecified as to partial versus complete obstruction: Principal | ICD-10-CM | POA: Diagnosis present

## 2024-11-27 DIAGNOSIS — R509 Fever, unspecified: Secondary | ICD-10-CM

## 2024-11-27 DIAGNOSIS — R54 Age-related physical debility: Secondary | ICD-10-CM | POA: Diagnosis present

## 2024-11-27 DIAGNOSIS — Z515 Encounter for palliative care: Secondary | ICD-10-CM

## 2024-11-27 DIAGNOSIS — G4700 Insomnia, unspecified: Secondary | ICD-10-CM | POA: Diagnosis present

## 2024-11-27 DIAGNOSIS — Z79899 Other long term (current) drug therapy: Secondary | ICD-10-CM

## 2024-11-27 DIAGNOSIS — K2100 Gastro-esophageal reflux disease with esophagitis, without bleeding: Secondary | ICD-10-CM | POA: Diagnosis present

## 2024-11-27 DIAGNOSIS — Z86718 Personal history of other venous thrombosis and embolism: Secondary | ICD-10-CM

## 2024-11-27 DIAGNOSIS — F32A Depression, unspecified: Secondary | ICD-10-CM | POA: Diagnosis present

## 2024-11-27 DIAGNOSIS — J982 Interstitial emphysema: Secondary | ICD-10-CM | POA: Diagnosis not present

## 2024-11-27 DIAGNOSIS — C169 Malignant neoplasm of stomach, unspecified: Secondary | ICD-10-CM | POA: Diagnosis present

## 2024-11-27 DIAGNOSIS — B370 Candidal stomatitis: Secondary | ICD-10-CM | POA: Diagnosis not present

## 2024-11-27 DIAGNOSIS — E1140 Type 2 diabetes mellitus with diabetic neuropathy, unspecified: Secondary | ICD-10-CM | POA: Diagnosis present

## 2024-11-27 DIAGNOSIS — E43 Unspecified severe protein-calorie malnutrition: Secondary | ICD-10-CM | POA: Diagnosis present

## 2024-11-27 DIAGNOSIS — D62 Acute posthemorrhagic anemia: Secondary | ICD-10-CM | POA: Diagnosis not present

## 2024-11-27 DIAGNOSIS — K449 Diaphragmatic hernia without obstruction or gangrene: Secondary | ICD-10-CM | POA: Diagnosis present

## 2024-11-27 DIAGNOSIS — R180 Malignant ascites: Secondary | ICD-10-CM | POA: Diagnosis present

## 2024-11-27 DIAGNOSIS — E1165 Type 2 diabetes mellitus with hyperglycemia: Secondary | ICD-10-CM | POA: Diagnosis present

## 2024-11-27 DIAGNOSIS — K760 Fatty (change of) liver, not elsewhere classified: Secondary | ICD-10-CM | POA: Diagnosis present

## 2024-11-27 DIAGNOSIS — T797XXA Traumatic subcutaneous emphysema, initial encounter: Secondary | ICD-10-CM

## 2024-11-27 DIAGNOSIS — Z6823 Body mass index (BMI) 23.0-23.9, adult: Secondary | ICD-10-CM

## 2024-11-27 DIAGNOSIS — Z87442 Personal history of urinary calculi: Secondary | ICD-10-CM

## 2024-11-27 DIAGNOSIS — E119 Type 2 diabetes mellitus without complications: Secondary | ICD-10-CM

## 2024-11-27 DIAGNOSIS — K3189 Other diseases of stomach and duodenum: Secondary | ICD-10-CM | POA: Diagnosis not present

## 2024-11-27 DIAGNOSIS — J690 Pneumonitis due to inhalation of food and vomit: Secondary | ICD-10-CM | POA: Diagnosis present

## 2024-11-27 DIAGNOSIS — Z95828 Presence of other vascular implants and grafts: Secondary | ICD-10-CM

## 2024-11-27 DIAGNOSIS — K311 Adult hypertrophic pyloric stenosis: Secondary | ICD-10-CM | POA: Diagnosis present

## 2024-11-27 DIAGNOSIS — R710 Precipitous drop in hematocrit: Secondary | ICD-10-CM

## 2024-11-27 DIAGNOSIS — Z87891 Personal history of nicotine dependence: Secondary | ICD-10-CM

## 2024-11-27 DIAGNOSIS — K5669 Other partial intestinal obstruction: Secondary | ICD-10-CM | POA: Diagnosis present

## 2024-11-27 DIAGNOSIS — C786 Secondary malignant neoplasm of retroperitoneum and peritoneum: Secondary | ICD-10-CM | POA: Diagnosis present

## 2024-11-27 DIAGNOSIS — K2289 Other specified disease of esophagus: Secondary | ICD-10-CM | POA: Diagnosis present

## 2024-11-27 DIAGNOSIS — R9431 Abnormal electrocardiogram [ECG] [EKG]: Secondary | ICD-10-CM | POA: Diagnosis present

## 2024-11-27 DIAGNOSIS — K2970 Gastritis, unspecified, without bleeding: Secondary | ICD-10-CM | POA: Diagnosis present

## 2024-11-27 DIAGNOSIS — Z66 Do not resuscitate: Secondary | ICD-10-CM | POA: Diagnosis present

## 2024-11-27 DIAGNOSIS — C163 Malignant neoplasm of pyloric antrum: Principal | ICD-10-CM | POA: Diagnosis present

## 2024-11-27 DIAGNOSIS — R042 Hemoptysis: Secondary | ICD-10-CM | POA: Diagnosis not present

## 2024-11-27 DIAGNOSIS — R0902 Hypoxemia: Secondary | ICD-10-CM | POA: Diagnosis present

## 2024-11-27 LAB — CBC
HCT: 41.5 % (ref 39.0–52.0)
Hemoglobin: 14.5 g/dL (ref 13.0–17.0)
MCH: 30.7 pg (ref 26.0–34.0)
MCHC: 34.9 g/dL (ref 30.0–36.0)
MCV: 87.9 fL (ref 80.0–100.0)
Platelets: 274 K/uL (ref 150–400)
RBC: 4.72 MIL/uL (ref 4.22–5.81)
RDW: 13.3 % (ref 11.5–15.5)
WBC: 7.1 K/uL (ref 4.0–10.5)
nRBC: 0 % (ref 0.0–0.2)

## 2024-11-27 LAB — COMPREHENSIVE METABOLIC PANEL WITH GFR
ALT: 62 U/L — ABNORMAL HIGH (ref 0–44)
AST: 25 U/L (ref 15–41)
Albumin: 4 g/dL (ref 3.5–5.0)
Alkaline Phosphatase: 101 U/L (ref 38–126)
Anion gap: 14 (ref 5–15)
BUN: 29 mg/dL — ABNORMAL HIGH (ref 6–20)
CO2: 25 mmol/L (ref 22–32)
Calcium: 10 mg/dL (ref 8.9–10.3)
Chloride: 97 mmol/L — ABNORMAL LOW (ref 98–111)
Creatinine, Ser: 0.83 mg/dL (ref 0.61–1.24)
GFR, Estimated: >60 mL/min (ref >60)
Glucose, Bld: 122 mg/dL — ABNORMAL HIGH (ref 70–99)
Potassium: 4 mmol/L (ref 3.5–5.1)
Sodium: 135 mmol/L (ref 135–145)
Total Bilirubin: 0.6 mg/dL (ref 0.0–1.2)
Total Protein: 7.7 g/dL (ref 6.5–8.1)

## 2024-11-27 LAB — TROPONIN T, HIGH SENSITIVITY: Troponin T High Sensitivity: <15 ng/L (ref 0–19)

## 2024-11-27 LAB — LIPASE, BLOOD: Lipase: 171 U/L — ABNORMAL HIGH (ref 11–51)

## 2024-11-27 LAB — PROTIME-INR
INR: 1.1 (ref 0.8–1.2)
Prothrombin Time: 14.8 s (ref 11.4–15.2)

## 2024-11-27 MED ORDER — PANTOPRAZOLE SODIUM 40 MG IV SOLR
40.0000 mg | Freq: Two times a day (BID) | INTRAVENOUS | Status: DC
  Administered 2024-12-01 – 2024-12-23 (×46): 40 mg via INTRAVENOUS
  Filled 2024-11-27 (×32): qty 10

## 2024-11-27 MED ORDER — PANTOPRAZOLE SODIUM 40 MG IV SOLR: 40.0000 mg | Freq: Two times a day (BID) | INTRAVENOUS | Status: DC

## 2024-11-27 MED ORDER — PANTOPRAZOLE SODIUM 40 MG IV SOLR
40.0000 mg | INTRAVENOUS | Status: AC
  Administered 2024-11-27 (×2): 40 mg via INTRAVENOUS

## 2024-11-27 MED ORDER — PANTOPRAZOLE SODIUM 40 MG IV SOLR
40.0000 mg | INTRAVENOUS | Status: DC
  Filled 2024-11-27 (×2): qty 10

## 2024-11-27 MED ORDER — PANTOPRAZOLE SODIUM 40 MG IV SOLR
40.0000 mg | Freq: Four times a day (QID) | INTRAVENOUS | Status: AC
  Administered 2024-11-28 – 2024-11-30 (×12): 40 mg via INTRAVENOUS
  Filled 2024-11-27 (×12): qty 10

## 2024-11-27 MED ORDER — DIPHENHYDRAMINE HCL 50 MG/ML IJ SOLN
12.5000 mg | Freq: Once | INTRAMUSCULAR | Status: AC
  Administered 2024-11-27: 12.5 mg via INTRAVENOUS
  Filled 2024-11-27: qty 1

## 2024-11-27 MED ORDER — SODIUM CHLORIDE 0.9 % IV BOLUS
1000.0000 mL | Freq: Once | INTRAVENOUS | Status: AC
  Administered 2024-11-27: 1000 mL via INTRAVENOUS

## 2024-11-27 MED ORDER — SODIUM CHLORIDE 0.9 % IV SOLN: INTRAVENOUS | Status: AC

## 2024-11-27 MED ORDER — IOHEXOL 300 MG/ML  SOLN
100.0000 mL | Freq: Once | INTRAMUSCULAR | Status: AC | PRN
  Administered 2024-11-27: 100 mL via INTRAVENOUS

## 2024-11-27 MED ORDER — METOCLOPRAMIDE HCL 5 MG/ML IJ SOLN
10.0000 mg | Freq: Once | INTRAMUSCULAR | Status: AC
  Administered 2024-11-27: 10 mg via INTRAVENOUS
  Filled 2024-11-27: qty 2

## 2024-11-27 MED ORDER — ONDANSETRON HCL 4 MG/2ML IJ SOLN
4.0000 mg | Freq: Once | INTRAMUSCULAR | Status: AC
  Administered 2024-11-27: 4 mg via INTRAVENOUS
  Filled 2024-11-27: qty 2

## 2024-11-27 NOTE — ED Triage Notes (Signed)
 Pt to ED with vomiting coffee ground emesis after starting a new chemo treatment yesterday. Increased weakness. Pt unable to keep anything (foot, water , medicine) down.

## 2024-11-27 NOTE — ED Notes (Signed)
 Pt unable to orthostatics vitals at this time

## 2024-11-27 NOTE — ED Provider Notes (Incomplete)
 [place] EMERGENCY DEPARTMENT AT [facility] Provider Note   CSN: [id number] Arrival date & time: 11/27/24  1906     Patient presents with: No chief complaint on file.   Henry May is a 58 y.o. male.  {Add pertinent medical, surgical, social history, OB history to HPI:32947} HPI     Prior to Admission medications  Medication Sig Start Date End Date Taking? Authorizing Provider  aluminum-magnesium  hydroxide 200-200 MG/5ML suspension Take 10 mLs by mouth every 6 (six) hours as needed for indigestion.    [provider]  dexamethasone  (DECADRON ) 4 MG tablet Take 1 tablet (4 mg total) by mouth daily. Take in morning 11/09/24   Cloretta Arley NOVAK, MD  lidocaine -prilocaine  (EMLA ) cream Apply 1 Application topically as directed. Apply 1/2 tablespoon to port site 1-2 hours prior to stick and cover with Press-and-Seal to numb port site. Do not start until 14 days of port placement. 01/27/24   Cloretta Arley NOVAK, MD  LORazepam  (ATIVAN ) 0.5 MG tablet Place 1 tablet (0.5 mg total) under the tongue every 6 (six) hours as needed (nausea). 11/09/24   Cloretta Arley NOVAK, MD  metoCLOPramide  (REGLAN ) 10 MG tablet Take 1 tablet (10 mg total) by mouth every 6 (six) hours. 11/15/24   Cloretta Arley NOVAK, MD  omeprazole  (PRILOSEC) 40 MG capsule Take 1 capsule (40 mg total) by mouth 2 (two) times daily. Patient not taking: Reported on 11/26/2024 11/18/24   Charlanne Groom, MD  ondansetron  (ZOFRAN ) 8 MG tablet Take 1 tablet (8 mg total) by mouth every 8 (eight) hours as needed for nausea. May start 72 hours after chemotherapy treatment given due to receiving Aloxi  on day 1 01/27/24   Cloretta Arley NOVAK, MD  prochlorperazine  (COMPAZINE ) 25 MG suppository Place 1 suppository (25 mg total) rectally every 12 (twelve) hours as needed for nausea or vomiting. Patient not taking: Reported on 11/26/2024 11/04/24   Neysa Thersia RAMAN, PA-C  scopolamine  (TRANSDERM-SCOP) 1 MG/3DAYS Place 1 patch (1 mg total) onto the  skin every 3 (three) days as needed. Apply patch to skin and change every 72 hours as needed for nausea due to gastric cancer 11/22/24   Cloretta Arley NOVAK, MD  sucralfate  (CARAFATE ) 1 GM/10ML suspension Take 10 mLs (1 g total) by mouth 4 (four) times daily. 11/18/24   Charlanne Groom, MD  traMADol  (ULTRAM ) 50 MG tablet Take 1 tablet (50 mg total) by mouth every 8 (eight) hours as needed. Do not drive while taking pain medication. Patient not taking: Reported on 11/26/2024 01/27/24   Debby Olam POUR, NP    Allergies: Fluorouracil     Review of Systems  Updated Vital Signs BP (!) 124/91 (BP Location: Left Arm)   Pulse (!) 103   Temp 98.5 F (36.9 C) (Oral)   Resp 16   SpO2 93%   Physical Exam  (all labs ordered are listed, but only abnormal results are displayed) Labs Reviewed  LIPASE, BLOOD  COMPREHENSIVE METABOLIC PANEL WITH GFR  CBC    EKG: None  Radiology: No results found.  {Document cardiac monitor, telemetry assessment procedure when appropriate:32947} Procedures   Medications Ordered in the ED - No data to display    {Click here for ABCD2, HEART and other calculators REFRESH Note before signing:1}                              Medical Decision Making Amount and/or Complexity of Data Reviewed Labs: ordered. Radiology: ordered.  Risk Prescription drug management. Decision regarding hospitalization.   ***  Spoke with [facility], Dr. Charlanne, will defer to GI and can re-engage if deemed necessary.   {Document critical care time when appropriate  Document review of labs and clinical decision tools ie [id number], etc  Document your independent review of radiology images and any outside records  Document your discussion with family members, caretakers and with consultants  Document social determinants of health affecting pt's care  Document your decision making why or why not admission, treatments were needed:32947:::1}   Final diagnoses:  None    ED Discharge  Orders     None

## 2024-11-28 DIAGNOSIS — Z515 Encounter for palliative care: Secondary | ICD-10-CM

## 2024-11-28 DIAGNOSIS — K56600 Partial intestinal obstruction, unspecified as to cause: Secondary | ICD-10-CM | POA: Diagnosis present

## 2024-11-28 DIAGNOSIS — Z87891 Personal history of nicotine dependence: Secondary | ICD-10-CM | POA: Diagnosis not present

## 2024-11-28 DIAGNOSIS — K920 Hematemesis: Secondary | ICD-10-CM | POA: Diagnosis not present

## 2024-11-28 DIAGNOSIS — K449 Diaphragmatic hernia without obstruction or gangrene: Secondary | ICD-10-CM | POA: Diagnosis not present

## 2024-11-28 DIAGNOSIS — R042 Hemoptysis: Secondary | ICD-10-CM | POA: Diagnosis not present

## 2024-11-28 DIAGNOSIS — D649 Anemia, unspecified: Secondary | ICD-10-CM | POA: Diagnosis not present

## 2024-11-28 DIAGNOSIS — R710 Precipitous drop in hematocrit: Secondary | ICD-10-CM | POA: Diagnosis not present

## 2024-11-28 DIAGNOSIS — Z95828 Presence of other vascular implants and grafts: Secondary | ICD-10-CM | POA: Diagnosis not present

## 2024-11-28 DIAGNOSIS — K5669 Other partial intestinal obstruction: Secondary | ICD-10-CM | POA: Diagnosis present

## 2024-11-28 DIAGNOSIS — Z8616 Personal history of COVID-19: Secondary | ICD-10-CM | POA: Diagnosis not present

## 2024-11-28 DIAGNOSIS — Z86718 Personal history of other venous thrombosis and embolism: Secondary | ICD-10-CM | POA: Diagnosis not present

## 2024-11-28 DIAGNOSIS — K311 Adult hypertrophic pyloric stenosis: Secondary | ICD-10-CM | POA: Diagnosis present

## 2024-11-28 DIAGNOSIS — R64 Cachexia: Secondary | ICD-10-CM | POA: Diagnosis present

## 2024-11-28 DIAGNOSIS — F32A Depression, unspecified: Secondary | ICD-10-CM | POA: Diagnosis present

## 2024-11-28 DIAGNOSIS — K562 Volvulus: Secondary | ICD-10-CM | POA: Diagnosis not present

## 2024-11-28 DIAGNOSIS — Z711 Person with feared health complaint in whom no diagnosis is made: Secondary | ICD-10-CM | POA: Diagnosis not present

## 2024-11-28 DIAGNOSIS — C169 Malignant neoplasm of stomach, unspecified: Secondary | ICD-10-CM | POA: Diagnosis not present

## 2024-11-28 DIAGNOSIS — J690 Pneumonitis due to inhalation of food and vomit: Secondary | ICD-10-CM | POA: Diagnosis present

## 2024-11-28 DIAGNOSIS — Z66 Do not resuscitate: Secondary | ICD-10-CM | POA: Diagnosis present

## 2024-11-28 DIAGNOSIS — R110 Nausea: Secondary | ICD-10-CM | POA: Diagnosis not present

## 2024-11-28 DIAGNOSIS — R748 Abnormal levels of other serum enzymes: Secondary | ICD-10-CM | POA: Diagnosis not present

## 2024-11-28 DIAGNOSIS — B370 Candidal stomatitis: Secondary | ICD-10-CM | POA: Diagnosis not present

## 2024-11-28 DIAGNOSIS — D62 Acute posthemorrhagic anemia: Secondary | ICD-10-CM | POA: Diagnosis not present

## 2024-11-28 DIAGNOSIS — R112 Nausea with vomiting, unspecified: Secondary | ICD-10-CM | POA: Diagnosis not present

## 2024-11-28 DIAGNOSIS — R7401 Elevation of levels of liver transaminase levels: Secondary | ICD-10-CM | POA: Diagnosis not present

## 2024-11-28 DIAGNOSIS — K2210 Ulcer of esophagus without bleeding: Secondary | ICD-10-CM | POA: Diagnosis not present

## 2024-11-28 DIAGNOSIS — Z7189 Other specified counseling: Secondary | ICD-10-CM | POA: Diagnosis not present

## 2024-11-28 DIAGNOSIS — I10 Essential (primary) hypertension: Secondary | ICD-10-CM | POA: Diagnosis present

## 2024-11-28 DIAGNOSIS — N3080 Other cystitis without hematuria: Secondary | ICD-10-CM | POA: Diagnosis present

## 2024-11-28 DIAGNOSIS — C162 Malignant neoplasm of body of stomach: Secondary | ICD-10-CM | POA: Diagnosis not present

## 2024-11-28 DIAGNOSIS — K56609 Unspecified intestinal obstruction, unspecified as to partial versus complete obstruction: Principal | ICD-10-CM | POA: Diagnosis present

## 2024-11-28 DIAGNOSIS — D509 Iron deficiency anemia, unspecified: Secondary | ICD-10-CM | POA: Diagnosis not present

## 2024-11-28 DIAGNOSIS — E43 Unspecified severe protein-calorie malnutrition: Secondary | ICD-10-CM | POA: Diagnosis present

## 2024-11-28 DIAGNOSIS — D630 Anemia in neoplastic disease: Secondary | ICD-10-CM | POA: Diagnosis present

## 2024-11-28 DIAGNOSIS — R509 Fever, unspecified: Secondary | ICD-10-CM | POA: Diagnosis not present

## 2024-11-28 DIAGNOSIS — E119 Type 2 diabetes mellitus without complications: Secondary | ICD-10-CM | POA: Diagnosis not present

## 2024-11-28 DIAGNOSIS — E1140 Type 2 diabetes mellitus with diabetic neuropathy, unspecified: Secondary | ICD-10-CM | POA: Diagnosis present

## 2024-11-28 DIAGNOSIS — K226 Gastro-esophageal laceration-hemorrhage syndrome: Secondary | ICD-10-CM | POA: Diagnosis not present

## 2024-11-28 DIAGNOSIS — R180 Malignant ascites: Secondary | ICD-10-CM | POA: Diagnosis present

## 2024-11-28 DIAGNOSIS — C163 Malignant neoplasm of pyloric antrum: Secondary | ICD-10-CM | POA: Diagnosis present

## 2024-11-28 DIAGNOSIS — K760 Fatty (change of) liver, not elsewhere classified: Secondary | ICD-10-CM | POA: Diagnosis present

## 2024-11-28 DIAGNOSIS — C787 Secondary malignant neoplasm of liver and intrahepatic bile duct: Secondary | ICD-10-CM | POA: Diagnosis present

## 2024-11-28 DIAGNOSIS — C786 Secondary malignant neoplasm of retroperitoneum and peritoneum: Secondary | ICD-10-CM | POA: Diagnosis present

## 2024-11-28 LAB — COMPREHENSIVE METABOLIC PANEL WITH GFR
ALT: 51 U/L — ABNORMAL HIGH (ref 0–44)
AST: 21 U/L (ref 15–41)
Albumin: 3.6 g/dL (ref 3.5–5.0)
Alkaline Phosphatase: 82 U/L (ref 38–126)
Anion gap: 11 (ref 5–15)
BUN: 26 mg/dL — ABNORMAL HIGH (ref 6–20)
CO2: 25 mmol/L (ref 22–32)
Calcium: 9.2 mg/dL (ref 8.9–10.3)
Chloride: 102 mmol/L (ref 98–111)
Creatinine, Ser: 0.87 mg/dL (ref 0.61–1.24)
GFR, Estimated: >60 mL/min (ref >60)
Glucose, Bld: 119 mg/dL — ABNORMAL HIGH (ref 70–99)
Potassium: 3.8 mmol/L (ref 3.5–5.1)
Sodium: 138 mmol/L (ref 135–145)
Total Bilirubin: 0.4 mg/dL (ref 0.0–1.2)
Total Protein: 6.8 g/dL (ref 6.5–8.1)

## 2024-11-28 LAB — HEMOGLOBIN A1C
Hgb A1c MFr Bld: 5.6 % (ref 4.8–5.6)
Hgb A1c MFr Bld: 5.7 % — ABNORMAL HIGH (ref 4.8–5.6)
Mean Plasma Glucose: 114.02 mg/dL
Mean Plasma Glucose: 116.89 mg/dL

## 2024-11-28 LAB — CBC
HCT: 39.6 % (ref 39.0–52.0)
Hemoglobin: 13.3 g/dL (ref 13.0–17.0)
MCH: 30.4 pg (ref 26.0–34.0)
MCHC: 33.6 g/dL (ref 30.0–36.0)
MCV: 90.4 fL (ref 80.0–100.0)
Platelets: 239 K/uL (ref 150–400)
RBC: 4.38 MIL/uL (ref 4.22–5.81)
RDW: 13.7 % (ref 11.5–15.5)
WBC: 4.9 K/uL (ref 4.0–10.5)
nRBC: 0 % (ref 0.0–0.2)

## 2024-11-28 LAB — MAGNESIUM: Magnesium: 2.4 mg/dL (ref 1.7–2.4)

## 2024-11-28 MED ORDER — DIPHENHYDRAMINE HCL 50 MG/ML IJ SOLN
25.0000 mg | Freq: Once | INTRAMUSCULAR | Status: AC
  Administered 2024-11-28: 25 mg via INTRAVENOUS
  Filled 2024-11-28: qty 1

## 2024-11-28 MED ORDER — KETAMINE HCL 10 MG/ML IJ SOLN
20.0000 mg | Freq: Once | INTRAMUSCULAR | Status: DC
  Filled 2024-11-28: qty 1

## 2024-11-28 MED ORDER — DEXTROSE IN LACTATED RINGERS 5 % IV SOLN: INTRAVENOUS | Status: AC

## 2024-11-28 MED ORDER — SODIUM CHLORIDE 0.9 % IV SOLN: 8.0000 mg | Freq: Three times a day (TID) | INTRAVENOUS | Status: DC

## 2024-11-28 MED ORDER — METOCLOPRAMIDE HCL 5 MG/ML IJ SOLN
10.0000 mg | Freq: Four times a day (QID) | INTRAMUSCULAR | Status: DC
  Administered 2024-11-28 – 2024-11-29 (×3): 10 mg via INTRAVENOUS
  Filled 2024-11-28 (×3): qty 2

## 2024-11-28 MED ORDER — SODIUM CHLORIDE 0.9 % IV SOLN
3.0000 g | Freq: Four times a day (QID) | INTRAVENOUS | Status: DC
  Administered 2024-11-28 – 2024-12-05 (×27): 3 g via INTRAVENOUS
  Filled 2024-11-28 (×27): qty 8

## 2024-11-28 MED ORDER — PROCHLORPERAZINE EDISYLATE 10 MG/2ML IJ SOLN: 10.0000 mg | Freq: Four times a day (QID) | INTRAMUSCULAR | Status: DC | PRN

## 2024-11-28 MED ORDER — ACETAMINOPHEN 650 MG RE SUPP: 650.0000 mg | Freq: Four times a day (QID) | RECTAL | Status: DC | PRN

## 2024-11-28 MED ORDER — METOCLOPRAMIDE HCL 5 MG/ML IJ SOLN
10.0000 mg | Freq: Once | INTRAMUSCULAR | Status: DC
  Filled 2024-11-28: qty 2

## 2024-11-28 MED ORDER — CHLORHEXIDINE GLUCONATE CLOTH 2 % EX PADS
6.0000 | MEDICATED_PAD | Freq: Every day | CUTANEOUS | Status: DC
  Administered 2024-11-28 – 2025-01-05 (×39): 6 via TOPICAL

## 2024-11-28 MED ORDER — PROCHLORPERAZINE EDISYLATE 10 MG/2ML IJ SOLN
10.0000 mg | Freq: Once | INTRAMUSCULAR | Status: AC
  Administered 2024-11-28: 10 mg via INTRAVENOUS
  Filled 2024-11-28: qty 2

## 2024-11-28 MED ORDER — SODIUM CHLORIDE 0.9 % IV SOLN
3.0000 g | Freq: Once | INTRAVENOUS | Status: AC
  Administered 2024-11-28: 3 g via INTRAVENOUS

## 2024-11-28 MED ORDER — ACETAMINOPHEN 325 MG PO TABS
650.0000 mg | ORAL_TABLET | Freq: Four times a day (QID) | ORAL | Status: DC | PRN
  Administered 2024-12-11 – 2024-12-27 (×3): 650 mg via ORAL
  Filled 2024-11-28 (×3): qty 2

## 2024-11-28 MED ORDER — DEXAMETHASONE SODIUM PHOSPHATE 4 MG/ML IJ SOLN
2.0000 mg | Freq: Two times a day (BID) | INTRAMUSCULAR | Status: DC
  Administered 2024-11-28 – 2024-12-07 (×19): 2 mg via INTRAVENOUS
  Filled 2024-11-28 (×19): qty 1

## 2024-11-28 MED ORDER — SODIUM CHLORIDE 0.9 % IV SOLN
12.5000 mg | Freq: Four times a day (QID) | INTRAVENOUS | Status: DC | PRN
  Administered 2024-11-28 (×2): 12.5 mg via INTRAVENOUS
  Filled 2024-11-28 (×2): qty 12.5

## 2024-11-28 MED ORDER — SODIUM CHLORIDE 0.9% FLUSH
10.0000 mL | INTRAVENOUS | Status: DC | PRN
  Administered 2024-12-23: 10 mL

## 2024-11-28 MED ORDER — ONDANSETRON HCL 4 MG/2ML IJ SOLN: 4.0000 mg | Freq: Four times a day (QID) | INTRAMUSCULAR | Status: DC | PRN

## 2024-11-28 NOTE — Progress Notes (Signed)
 Progress Note   Patient: Henry May FMW:[id number] DOB: 07/23/1966 DOA: 11/27/2024     0 DOS: the patient was seen and examined on 11/28/2024   Brief hospital course: Per Dr. Fausto: HPI: Henry May is a 58 y.o. male with medical history significant of hx of DVT with IVC filter, recently diagnosed gastric cancer on chemotherapy, and EGD 1 week ago showing esophagitis and ulcerated mass who presented to [facility] ED for evaluation of nausea and vomiting with coffee ground emesis. Pt is drowsy from medications given, daughter and spouse at bedside assisted in providing history.  Patient has been on chemo since Feb this year for gastric cancer, had a scan in September that looked good, seemed to be responding to chemo. Since that time, patient has gone down hill -losing weight, eating less and less over time.  He reportedly had an EGD on 12/4 ago that showed grade D esophagitis without bleeding, and large infiltrative ulcerated circumferential mass in the gastric body and antrum.  Daughter reports patient was started on new chemotherapy 2 days ago on Friday, since then has developed worsening nausea and vomiting not relieved by any medications that have been tried thus far.  Emesis became coffee-ground appearing.  Patient reports vomiting triggered by that burning pain from esophagitis.  He has not even been able to keep down spoonfuls of water .  Anything he attempts to swallow makes him vomit.  Patient has had a slight cough but no fevers or chills.  No issues with diarrhea or constipation   ED course -  Initial vitals: Temp 98.5 F, HR 103, RR 16, BP 124/91, SpO2 93-97% on room air. Labs obtained including CMP and CBC were notable for chloride 97, glucose 122, BUN 29, ALT 62, normal hemoglobin 14.5, no leukocytosis WBC 7.1. Lipase 171. Imaging --  CT abdomen pelvis with contrast showed dilated proximal small bowel suggesting SBO but without discrete transition point, thick walled gastric  body/antrum without definite focal mass, mild nodularity along the pylorus, scarring with superimposed patchy peribronchovascular nodularity in the bilateral lower lobes right more than left suggesting mild aspiration or pneumonia.   Patient was treated in the ED with IV Benadryl  and Reglan , IV Zofran , IV Protonix , IV Compazine , 1 L bolus NS fluids and continued at 125/h, started on Unasyn .   Patient is being admitted to progressive unit for further evaluation and management as outlined in detail below.  Gastroenterology is following closely.  Assessment and Plan: * Coffee ground emesis Related to patient's ulcerative gastric mass.  Hemoglobin normal and vital stable upon admission --Admit to progressive --GI is following, appreciate their management --IV Protonix  --Fluids: D5 LR --N.p.o. -- Trend hemoglobin transfuse for less than 7, or less than 8 with active bleeding   Partial small bowel obstruction (HCC) CT abdomen pelvis in the ED showed dilated proximal small bowel without a discrete transition point. --GI is following --Surgery was contacted by EDP, deferring management to GI at this time --Patient does not have NG tube in place at this time --N.p.o. and IV fluids for now --IV antiemetics --Monitor renal function and electrolytes  Intractable nausea and vomiting Patient has reportedly no relief with any medications that are been tried so far and not even able to keep down spoonfuls of water  in recent days.   -- Continue IV Compazine  as needed --Titrate antiemetic regimen --Palliative care consulted for symptom management and goals of care  Gastric cancer [facility]) Patient with significant weight loss and above complications including hematemesis, partial bowel  obstruction, refractory nausea vomiting since starting new chemo just couple days ago. -- Consult oncology and palliative care --Symptom management including pain control and antiemetics as needed, per orders  Diabetes  mellitus without complication (HCC) No recent A1c available.  Glucose on CMP in the ED was 122.  Patient is n.p.o. --Start CBGs and sliding scale insulin  if needed  History of DVT (deep vein thrombosis) S/p IVC filter   Subjective: The patient is still feeling nauseous despite antiemetics.  Physical Exam: Vitals:   11/28/24 0700 11/28/24 0730 11/28/24 0800 11/28/24 0917  BP: (!) 143/88  132/89   Pulse: 68 69 77   Resp:   (!) 21   Temp:    98 F (36.7 C)  TempSrc:    Axillary  SpO2: 95% 95% 95%    Physical Exam Vitals reviewed.  Constitutional:      General: He is awake. He is not in acute distress.    Appearance: He is normal weight. He is ill-appearing.  HENT:     Head: Normocephalic.     Nose: No rhinorrhea.     Mouth/Throat:     Mouth: Mucous membranes are dry.  Eyes:     General: No scleral icterus.    Pupils: Pupils are equal, round, and reactive to light.  Cardiovascular:     Rate and Rhythm: Normal rate and regular rhythm.  Pulmonary:     Breath sounds: Rales present. No wheezing or rhonchi.  Abdominal:     General: Bowel sounds are normal. There is distension.     Palpations: Abdomen is soft.     Tenderness: There is abdominal tenderness. There is no guarding or rebound.  Musculoskeletal:     Cervical back: Neck supple.     Right lower leg: No edema.     Left lower leg: No edema.  Skin:    General: Skin is warm and dry.  Neurological:     Mental Status: He is alert. Mental status is at baseline.  Psychiatric:        Mood and Affect: Mood normal.        Behavior: Behavior normal. Behavior is cooperative.     Data Reviewed:  Results are pending, will review when available.  Family Communication: Her daughter and wife were at bedside.  Disposition: Admit to PCU.   Planned Discharge Destination: Home  Time spent: 20 minutes  Author: Alm Dorn Castor, MD 11/28/2024 10:40 AM  For on call review [email or web address].   This document was prepared  using Dragon voice recognition software and may contain some unintended transcription errors.

## 2024-11-28 NOTE — ED Notes (Signed)
 [place] with [facility] called for transport to [facility], dr young accepting

## 2024-11-28 NOTE — ED Notes (Signed)
 Daughter is in active duty with [employer], She got emergency leave with [employer].  Spoke with same about patients condition and status. If any changes in condition or need to speak with them regarding change in status for extended leave for daughter below is the case number and call back number  Case # [id number] Call back # [phone]

## 2024-11-28 NOTE — H&P (View-Only) (Signed)
 Chief Complaint: Nausea/vomiting Oncologist-Dr. Audery Referring Provider: Burnard Cunning MD    Gastroenterologist-Dr. Charlanne  ASSESSMENT AND PLAN;   #1.  Worsening N/V - d/t ?Cisplatin  based chemo. Pt with antral stenosis, gastric stasis without mechanical outlet obstruction on recent UGI series and EGD.  #2. Dilated SB loops- may represent functional obs d/t known peritoneal carcinomatosis, impaired peristalsis (Malignant ileus). Barium does reach colon in 3 hrs.  #3. Stage IV Gastric Adeno Ca  Plan: - Continue supportive treatment with IVF, IV protonix , IV reglan /phenergan  - Will discuss with Dr. Wilhelmenia in AM regarding gastric stenting.  - Trend CBC, CMP. - Suggest onc consultation in AM. - Will follow along.   HPI:    Henry May is a 58 y.o. male  Known to me with Gastric Ca, DVT s/p IVC filter (no AC)  Dx with Gastric Ca Dec 2024 with peritoneal mets, malignant ascites despite oxaliplatin  based chemo With continued nausea/vomiting/early satiety/weight loss  CT Abdo/pelvis initially showed gastric thickening with dilated small bowel loops Underwent upper GI with small bowel series 11/28 followed by EGD 12/4 which confirmed tumor progression.  Both upper GI series and EGD revealed antral stenosis which did allow passage of scope and contrast.  Dilated small bowel loops without any definite obstruction.  The contrast did reach colon in 3 hours.  Started on cisplatin  based chemo first dose 11/26/2024  Admitted with intractable nausea/vomiting/coffee-ground emesis with stable hemoglobin (Hb 14.5 - baseline) and unable to keep anything down.  Repeat CT Abdo/pelvis again showing proximal dilated small bowel loops- ?Mild obstruction but unchanged.  Admitted for supportive treatment, any further GI intervention.  Currently on IV fluids, IV Zofran , IV Protonix , IV Compazine  and also started on Unasyn .  No issues with diarrhea or constipation  Wt loss 40lb since sept  2025   Recent GI workup: CT Abdo/pelvis with contrast 11/27/2024 1. Dilated proximal small bowel, suggesting small bowel obstruction, although without a discrete transition point. 2. Thick-walled gastric body/antrum in this patient with history of gastric cancer, without definite focal mass, with mild nodularity along the pylorus. 3. Scarring with superimposed patchy peribronchovascular nodularity in the bilateral lower lobes, right greater than left, suggesting mild aspiration/pneumonia. 4. Additional ancillary findings, as above.  Upper GI series with small bowel follow-through 11/12/2024 1. Persistent narrowing at the gastric antrum and corresponds with the wall thickening on the recent CT. There are filling defects at this area of narrowing that could represent gastric contents but indeterminate. This could be further evaluated with direct visualization. 2. Normal gastric emptying. 3. Dilated duodenum and proximal jejunum. Contrast moved into the right colon after 3 hours. No evidence for a bowel obstruction   EGD 11/18/2024 - LA Grade D reflux esophagitis with no bleeding. Bx-squamous mucosa with focal ulceration. - Small hiatal hernia.  - Malignant infiltrative gastric tumor in the gastric body and in the gastric antrum with antral narrowing. The tumor has definitely progressed as compared to previous EGD. 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 - I do not believe gastric stenting will be helpful at this time ( may need it in future) . I have reviewed the upper GI with small bowel series performed recently. There does not seem to be any mechanical obstruction amenable to stenting in the small bowel either. I will run it by Dr. Wilhelmenia.    Past Medical History:  Diagnosis Date   Acute respiratory failure (HCC) 08/06/2020  Chronic cough    SINCE 08-06-2020  COVID PNEUMONIA   COVID 08/06/2020   Dvt femoral (deep venous thrombosis) (HCC) 08/2020   LEFT LEG    GERD (gastroesophageal reflux disease)    History of blood transfusion 08/2020   AFTER MI 5 UNITS GIVEN   History of kidney stones    Hypertension    Numbness    LEFT LEG AT TIMES   Pneumonia 08/06/2020   COVID PNEUMONIA    Past Surgical History:  Procedure Laterality Date   CYSTOSCOPY WITH STENT PLACEMENT Left 10/06/2020   Procedure: CYSTOSCOPY WITH STENT PLACEMENT;  Surgeon: Devere Lonni Righter, MD;  Location: [facility];  Service: Urology;  Laterality: Left;   CYSTOSCOPY/URETEROSCOPY/HOLMIUM LASER/STENT PLACEMENT Left 11/14/2020   Procedure: CYSTOSCOPY/URETEROSCOPY/HOLMIUM LASER/STENT PLACEMENT;  Surgeon: Devere Lonni Righter, MD;  Location: [facility];  Service: Urology;  Laterality: Left;  ONLY NEEDS 45 MIN   IR IMAGING GUIDED PORT INSERTION  01/22/2024   IR IVC FILTER PLMT / S&I /IMG GUID/MOD SED  08/24/2020   IR PARACENTESIS  01/22/2024    Family History  Problem Relation Age of Onset   Cancer Mother        type unknown   Diabetes Mellitus II Neg Hx    Colon cancer Neg Hx    Esophageal cancer Neg Hx    Rectal cancer Neg Hx    Stomach cancer Neg Hx     Social History[1]  Current Facility-Administered Medications  Medication Dose Route Frequency Provider Last Rate Last Admin   0.9 %  sodium chloride  infusion   Intravenous Continuous Jerrol Agent, MD 125 mL/hr at 11/28/24 0828 New Bag at 11/28/24 0828   acetaminophen  (TYLENOL ) tablet 650 mg  650 mg Oral Q6H PRN Fausto Sor A, DO       Or   acetaminophen  (TYLENOL ) suppository 650 mg  650 mg Rectal Q6H PRN Fausto Sor A, DO       Ampicillin -Sulbactam (UNASYN ) 3 g in sodium chloride  0.9 % 100 mL IVPB  3 g Intravenous Q6H BellRosaline DASEN, RPH   Stopped at 11/28/24 1404   dextrose  5 % in lactated ringers  infusion   Intravenous Continuous Fausto Sor A, DO 75 mL/hr at 11/28/24 1240 New  Bag at 11/28/24 1240   ketamine  (KETALAR ) injection 20 mg  20 mg Intravenous Once Floyd, Dan, DO       metoCLOPramide  (REGLAN ) injection 10 mg  10 mg Intravenous Q6H Celinda Alm Lot, MD       pantoprazole  (PROTONIX ) injection 40 mg  40 mg Intravenous Q6H Jerrol Agent, MD   40 mg at 11/28/24 0825   Followed by   NOREEN ON 12/01/2024] pantoprazole  (PROTONIX ) injection 40 mg  40 mg Intravenous Q12H Jerrol Agent, MD       promethazine  (PHENERGAN ) 12.5 mg in sodium chloride  0.9 % 50 mL IVPB  12.5 mg Intravenous Q6H PRN Fausto Sor LABOR, DO       Current Outpatient Medications  Medication Sig Dispense Refill   aluminum-magnesium  hydroxide 200-200 MG/5ML suspension Take 10 mLs by mouth every 6 (six) hours as needed for indigestion.     dexamethasone  (DECADRON ) 4 MG tablet Take 1 tablet (4 mg total) by mouth daily. Take in morning 30 tablet 1   famotidine  (PEPCID ) 20 MG tablet Take 20 mg by mouth 2 (two) times daily.     lidocaine -prilocaine  (EMLA ) cream Apply 1 Application topically as directed. Apply 1/2 tablespoon to port site 1-2 hours prior to stick and cover with Press-and-Seal  to numb port site. Do not start until 14 days of port placement. 30 g 3   LORazepam  (ATIVAN ) 0.5 MG tablet Place 1 tablet (0.5 mg total) under the tongue every 6 (six) hours as needed (nausea). 60 tablet 0   metoCLOPramide  (REGLAN ) 10 MG tablet Take 1 tablet (10 mg total) by mouth every 6 (six) hours. 120 tablet 0   omeprazole  (PRILOSEC) 40 MG capsule Take 1 capsule (40 mg total) by mouth 2 (two) times daily. 180 capsule 2   ondansetron  (ZOFRAN ) 8 MG tablet Take 1 tablet (8 mg total) by mouth every 8 (eight) hours as needed for nausea. May start 72 hours after chemotherapy treatment given due to receiving Aloxi  on day 1 30 tablet 1   scopolamine  (TRANSDERM-SCOP) 1 MG/3DAYS Place 1 patch (1 mg total) onto the skin every 3 (three) days as needed. Apply patch to skin and change every 72 hours as needed for  nausea due to gastric cancer 10 patch 2   sucralfate  (CARAFATE ) 1 GM/10ML suspension Take 10 mLs (1 g total) by mouth 4 (four) times daily. 420 mL 4   prochlorperazine  (COMPAZINE ) 25 MG suppository Place 1 suppository (25 mg total) rectally every 12 (twelve) hours as needed for nausea or vomiting. (Patient not taking: No sig reported) 12 suppository 0   Facility-Administered Medications Ordered in Other Encounters  Medication Dose Route Frequency Provider Last Rate Last Admin   sodium chloride  flush (NS) 0.9 % injection 10 mL  10 mL Intravenous PRN Cloretta Arley NOVAK, MD   10 mL at 02/16/24 1129    Allergies[2]  Review of Systems:       Physical Exam:    BP 134/84   Pulse 74   Temp 98.9 F (37.2 C) (Oral)   Resp 10   SpO2 96%  Wt Readings from Last 3 Encounters:  11/26/24 71.7 kg  11/22/24 74.3 kg  11/18/24 76.7 kg   Constitutional: Was drowsy, in no acute distress.  Cachectic HEENT: Pupils normal.  Conjunctivae are normal. No scleral icterus. Cardiovascular: Normal rate, regular rhythm. No edema Pulmonary/chest: Effort normal and breath sounds normal. No wheezing, rales or rhonchi. Abdominal: Soft, nondistended. Nontender. Bowel sounds decreased but active.  There are no masses palpable. No hepatomegaly. Rectal: Deferred Neurological: Alert and oriented to person place and time. Skin: Skin is warm and dry. No rashes noted.  Data Reviewed: I have personally reviewed following labs and imaging studies  CBC:    Latest Ref Rng & Units 11/27/2024    7:39 PM 11/24/2024    1:30 PM 11/09/2024    9:10 AM  CBC  WBC 4.0 - 10.5 K/uL 7.1  6.2  6.7   Hemoglobin 13.0 - 17.0 g/dL 85.4  85.3  85.8   Hematocrit 39.0 - 52.0 % 41.5  42.5  42.0   Platelets 150 - 400 K/uL 274  254  259     CMP:    Latest Ref Rng & Units 11/27/2024    7:39 PM 11/26/2024    9:17 AM 11/24/2024    1:30 PM  CMP  Glucose 70 - 99 mg/dL 877  894  886   BUN 6 - 20 mg/dL 29  24  26    Creatinine 0.61 -  1.24 mg/dL 9.16  9.16  9.09   Sodium 135 - 145 mmol/L 135  138  139   Potassium 3.5 - 5.1 mmol/L 4.0  3.7  3.8   Chloride 98 - 111 mmol/L 97  99  99  CO2 22 - 32 mmol/L 25  26  27    Calcium  8.9 - 10.3 mg/dL 89.9  89.8  9.7   Total Protein 6.5 - 8.1 g/dL 7.7     Total Bilirubin 0.0 - 1.2 mg/dL 0.6     Alkaline Phos 38 - 126 U/L 101     AST 15 - 41 U/L 25     ALT 0 - 44 U/L 62       GFR: Estimated Creatinine Clearance: 96 mL/min (by C-G formula based on SCr of 0.83 mg/dL). Liver Function Tests: Recent Labs  Lab 11/27/24 1939  AST 25  ALT 62*  ALKPHOS 101  BILITOT 0.6  PROT 7.7  ALBUMIN  4.0   Recent Labs  Lab 11/27/24 1939  LIPASE 171*   No results for input(s): AMMONIA in the last 168 hours. Coagulation Profile: Recent Labs  Lab 11/27/24 1939  INR 1.1      Radiology Studies: CT ABDOMEN PELVIS W CONTRAST Result Date: 11/27/2024 EXAM: CT ABDOMEN AND PELVIS WITH CONTRAST 11/27/2024 09:19:48 PM TECHNIQUE: CT of the abdomen and pelvis was performed with the administration of 100 mL of iohexol  (OMNIPAQUE ) 300 MG/ML solution. Multiplanar reformatted images are provided for review. Automated exposure control, iterative reconstruction, and/or weight-based adjustment of the mA/kV was utilized to reduce the radiation dose to as low as reasonably achievable. COMPARISON: 11/02/2024 CLINICAL HISTORY: Abd pain, coffee ground emesis, elevated lipase, hx of gastric cancer FINDINGS: LOWER CHEST: Scarring with superimposed patchy peribronchovascular nodularity in the bilateral lower lobes, right greater than left (image 9), suggesting mild aspiration/pneumonia. LIVER: The liver is unremarkable. GALLBLADDER AND BILE DUCTS: Gallbladder is unremarkable. No biliary ductal dilatation. SPLEEN: No acute abnormality. PANCREAS: No acute abnormality. ADRENAL GLANDS: No acute abnormality. KIDNEYS, URETERS AND BLADDER: 5 mm nonobstructing right lower pole renal calculus. No stones in the ureters. No  hydronephrosis. No perinephric or periureteral stranding. Urinary bladder is unremarkable. GI AND BOWEL: Fluid within the distal esophagus. Thick walled gastric body/antrum (image 17), chronic, without definite focal mass, although there is mild nodularity along the pylorus (image 21). Dilated proximal small bowel (image 32), suggesting small bowel obstruction, although without focal transition point. PERITONEUM AND RETROPERITONEUM: Small volume pelvic ascites. No free air. VASCULATURE: Aorta is normal in caliber. IVC filter. LYMPH NODES: No lymphadenopathy. REPRODUCTIVE ORGANS: The prostate is unremarkable. BONES AND SOFT TISSUES: Mild degenerative changes of the visualized thoracolumbar spine. No acute osseous abnormality. No focal soft tissue abnormality. IMPRESSION: 1. Dilated proximal small bowel, suggesting small bowel obstruction, although without a discrete transition point. 2. Thick-walled gastric body/antrum in this patient with history of gastric cancer, without definite focal mass, with mild nodularity along the pylorus. 3. Scarring with superimposed patchy peribronchovascular nodularity in the bilateral lower lobes, right greater than left, suggesting mild aspiration/pneumonia. 4. Additional ancillary findings, as above. Electronically signed by: Pinkie Pebbles MD 11/27/2024 09:26 PM EST RP Workstation: [id number]   DG Chest Portable 1 View Result Date: 11/27/2024 EXAM: 1 VIEW(S) XRAY OF THE CHEST 11/27/2024 07:47:51 PM COMPARISON: 02/05/2024 CLINICAL HISTORY: coffee ground emesis, weakness FINDINGS: LINES, TUBES AND DEVICES: Right IJ port catheter in place. LUNGS AND PLEURA: Patchy airspace opacities in right mid lung. No pleural effusion. No pneumothorax. HEART AND MEDIASTINUM: No acute abnormality of the cardiac and mediastinal silhouettes. BONES AND SOFT TISSUES: Thoracic spondylosis. IMPRESSION: 1. Patchy airspace opacities in the right mid lung, which may reflect aspiration or infection.  Electronically signed by: Greig Pique MD 11/27/2024 07:51 PM EST RP Workstation: [id number]  DG UGI W SMALL BOWEL SINGLE CM Result Date: 11/12/2024 CLINICAL DATA:  History of gastric cancer and status post chemotherapy. Persistent nausea and reflux symptoms. Oncology concern for possible gastric outlet obstruction. Consult for upper GI with small bowel follow-through study. EXAM: DG UGI W/ SMALL BOWEL TECHNIQUE: Combined double and single contrast examination was performed using effervescent crystals, high-density barium and thin liquid barium. Subsequently, serial images of the small bowel were obtained including spot views of the terminal ileum. This exam was performed by Kimble Clas, PA-C, and was supervised and interpreted by Dr. Juliene Balder. FLUOROSCOPY: Radiation Exposure Index (as provided by the fluoroscopic device): 90.40 mGy Kerma COMPARISON:  CT abdomen pelvis 11/02/2024 FINDINGS: Esophagus: Normal appearance. Esophageal motility: Within normal limits. Gastroesophageal reflux: None visualized. Ingested 13 mm barium tablet: Not given Stomach: Persistent narrowing in the distal stomach near the antrum with filling defects. Filling defects could be associated with gastric contents. The area of gastric narrowing corresponds with the wall thickening on the recent CT. Gastric emptying: Normal. Duodenum: Small duodenal diverticulum. Mild to moderate dilatation of the duodenum and proximal jejunum. Small bowel: Dilatation of the proximal jejunum. Normal caliber of the distal small bowel. Normal appearance of the terminal ileum. Contrast in the right colon after 3 hours. IMPRESSION: 1. Persistent narrowing at the gastric antrum and corresponds with the wall thickening on the recent CT. There are filling defects at this area of narrowing that could represent gastric contents but indeterminate. This could be further evaluated with direct visualization. 2. Normal gastric emptying. 3. Dilated duodenum and  proximal jejunum. Contrast moved into the right colon after 3 hours. No evidence for a bowel obstruction. Electronically Signed   By: Juliene Balder M.D.   On: 11/12/2024 14:17   CT ABDOMEN PELVIS W CONTRAST Result Date: 11/03/2024 EXAM: CT ABDOMEN AND PELVIS WITH CONTRAST 11/02/2024 02:24:55 PM TECHNIQUE: CT of the abdomen and pelvis was performed with the administration of intravenous contrast. Multiplanar reformatted images are provided for review. Automated exposure control, iterative reconstruction, and/or weight-based adjustment of the mA/kV was utilized to reduce the radiation dose to as low as reasonably achievable. CONTRAST: 100 mL of Omnipaque  300. COMPARISON: CT Abdomen and Pelvis 09/01/2024. CLINICAL HISTORY: Restaging gastric cancer with increased epigastric pain and reflux. * Tracking Code: BO * FINDINGS: LOWER CHEST: Chronic interstitial accentuation in the lung bases with mild scarring in the lingula and right middle lobe. LIVER: The liver is unremarkable. GALLBLADDER AND BILE DUCTS: Gallbladder is unremarkable. No biliary ductal dilatation. SPLEEN: No acute abnormality. PANCREAS: No acute abnormality. ADRENAL GLANDS: No acute abnormality. KIDNEYS, URETERS AND BLADDER: Millimeter (mm) right mid kidney nonobstructive renal calculus and mm 4 right mid to upper kidney nonobstructive renal calculus. No hydronephrosis. No perinephric or periureteral stranding. Urinary bladder is unremarkable. GI AND BOWEL: Diffuse mild wall thickening in the distal gastric body and the antrum without a well defined focal mass. This is shown for example on image 23, series 2. Small periampullary duodenal diverticulum. Wall thickening along loops of proximal jejunum, some of which may be from nondistention, although proximal enteritis is not excluded, for example anteriorly on image 43, series 2. There is no bowel obstruction. PERITONEUM AND RETROPERITONEUM: Small amount of pelvic ascites image 74, series 2. Small amount of  ascites along the left omentum without obvious nodularity. No free air. VASCULATURE: Infrarenal Inferior Vena Cava (IVC) filter. Aorta is normal in caliber. LYMPH NODES: Indistinctly marginated right gastric lymph node 1.0 cm in diameter on image 21, series  2 mildly increased in prominence compared to the previous study. REPRODUCTIVE ORGANS: No acute abnormality. BONES AND SOFT TISSUES: No acute osseous abnormality. Lower thoracic spondylosis. Mild degenerative arthropathy of both hips. Small bilateral erect inguinal hernia is contained out of soft tissue. IMPRESSION: 1. Diffuse mild wall thickening in the distal gastric body and antrum without a well-defined focal mass, similar to previous. 2. Indistinctly marginated right gastric lymph node measuring 1.0 cm, mildly increased in prominence compared to prior, borderline enlarged. Surveillance suggested. 3. Small volume ascites in the pelvis and along the left omentum without obvious nodularity. 4. Proximal jejunal mural thickening with possible nondistention versus proximal enteritis; correlation with symptoms and short-interval clinical/imaging follow-up as indicated. 5. Chronic interstitial lung disease with mild scarring in the lingula and right middle lobe. 6. Lower thoracic spondylosis. 7. Mild degenerative arthropathy of both hips. Electronically signed by: Ryan Salvage MD 11/03/2024 03:52 PM EST RP Workstation: [id number]      Anselm Bring, MD 11/28/2024, 2:43 PM  Cc: No ref. provider found       [1] Social History Tobacco Use   Smoking status: Former    Current packs/day: 0.00    Types: Cigarettes    Quit date: 1986    Years since quitting: 39.9    Passive exposure: Never   Smokeless tobacco: Never  Vaping Use   Vaping status: Never Used  Substance Use Topics   Alcohol use: Yes    Comment: occasional   Drug use: Never  [2] Allergies Allergen Reactions   Fluorouracil  Other (See Comments)    5-FU induced psychosis. See  progress note from 02/11/24

## 2024-11-28 NOTE — Assessment & Plan Note (Signed)
 Patient with significant weight loss and above complications including hematemesis, partial bowel obstruction, refractory nausea vomiting since starting new chemo just couple days ago. -- Consult oncology and palliative care --Symptom management including pain control and antiemetics as needed, per orders

## 2024-11-28 NOTE — ED Notes (Addendum)
 Assumed care of pt.  Pt is actively vomiting, requesting water /ice.  Provider ok with ice chips only.  Provided warm blanket and emesis container.  No other needs expressed at this time  Per report from [facility], pt [facility] for admit @ [facility], cancer patient, undergoing tx.  Uncontrolled emesis.  NG order discontinued.  0/10 pain vitals signs within range except tachypnea.

## 2024-11-28 NOTE — Consult Note (Signed)
 Palliative Care Consult Note                                  Date: 11/28/2024   Patient Name: Henry May  DOB: 1966/04/06  MRN: [id number]  Age / Sex: 58 y.o., male  PCP: [name], DO Referring Physician: Fausto Burnard LABOR, DO  Reason for Consultation: Establishing goals of care and Non pain symptom management  HPI/Patient Profile: 58 y.o. male  with past medical history of gastric cancer (new chemotherapy 12/12), DVT with IVC filter admitted on 11/27/2024 with nausea and vomiting with coffee ground emesis.   EGD 12/4 showed grade D esophagitis without bleeding and malignant infiltrative gastric tumor in the gastric body and in the gastric antrum with antral narrowing.  CT abdomen/pelvis with contrast 12/13 showed dilated proximal small bowel suggesting SBO but without discrete transition point, thick walled gastric body/antrum without definite focal mass, mild nodularity along the pylorus.   GI is following.    Past Medical History:  Diagnosis Date   Acute respiratory failure (HCC) 08/06/2020   Chronic cough    SINCE 08-06-2020 COVID PNEUMONIA   COVID 08/06/2020   Dvt femoral (deep venous thrombosis) (HCC) 08/2020   LEFT LEG    GERD (gastroesophageal reflux disease)    History of blood transfusion 08/2020   AFTER MI 5 UNITS GIVEN   History of kidney stones    Hypertension    Numbness    LEFT LEG AT TIMES   Pneumonia 08/06/2020   COVID PNEUMONIA    Subjective:   I have reviewed medical records including EPIC notes, labs and imaging, received update from team, assessed the patient and then met with his wife and daughter to discuss symptom management.   I introduced Palliative Medicine as specialized medical care for people living with serious illness. It focuses on providing relief from symptoms and stress of a serious illness. The goal is to improve quality of life for both the patient and the family.  Today's  Discussion: Patient is sleeping and does awaken when I enter the room. Patient's wife and daughter at bedside and share he has had a very long few days.  Discussed patient's cancer diagnosis and acute hospitalization.   Patient was doing well in general up until this September. At that time he began having increased reflux. This burning sensation/pain triggers his vomiting. The amount of intake he is able to take in has declined and before coming to the hospital he was unable to take in anything. He has lost 40 lbs. He has been increasingly weak.   Both patient and family are discouraged that the patient is having such a difficult time. The patient has always been a very active individual and this vomiting has impacted his functional status. 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 He has three children- the youngest is 54 years old.  During this admission patient has received IV reglan , IV zofran , IV protonix , IV compazine . We discussed options to improve his nausea- initially discussed using zofran  with the family but patient recently received palonosetron  on 12/12. Will add dexamethasone  2 mg IV bid and reassess tomorrow.  Briefly discussed advanced care planning. Patient's wife is proxy decision maker if patient could not make decisions. Confirmed full code and full scope.  Discussed the importance of continued conversation with family and the medical providers regarding  overall plan of care and treatment options, ensuring decisions are within the context of the patient's values and GOCs.  Emotional support and therapeutic listening provided. Questions and concerns were addressed. The family was encouraged to call with questions or concerns. PMT will continue to support holistically.  Review of Systems  Constitutional:  Positive for appetite change and fatigue.  Gastrointestinal:  Positive for abdominal pain, nausea and vomiting.     Objective:   Primary Diagnoses: Present on Admission:  Coffee ground emesis  Gastric cancer (HCC)  Intractable nausea and vomiting  Partial small bowel obstruction (HCC)   Physical Exam Vitals reviewed.  Constitutional:      General: He is not in acute distress.    Appearance: He is ill-appearing.  HENT:     Head: Normocephalic and atraumatic.  Cardiovascular:     Rate and Rhythm: Normal rate.  Pulmonary:     Effort: Tachypnea present.  Skin:    General: Skin is warm and dry.  Neurological:     Mental Status: He is oriented to person, place, and time.     Vital Signs:  BP (!) 141/91   Pulse 77   Temp 98.9 F (37.2 C) (Oral)   Resp (!) 21   SpO2 97%    Advanced Care Planning:   Existing Vynca/ACP Documentation: None  Primary Decision Maker: PATIENT  Code Status/Advance Care Planning: Full code  Assessment & Plan:   SUMMARY OF RECOMMENDATIONS   Continue full code and full scope of treatment Optimize antiemetic regimen Continued PMT support  Symptom Management:  Add dexamethasone  2mg  IV bid- will reassess tomorrow     Discussed with: bedside RN, Dr. Fausto, and pharmacist  Time Total: 120 minutes    Thank you for allowing us  to participate in the care of Kael Tufte PMT will continue to support holistically.   Signed by: Stephane Palin, NP Palliative Medicine Team  Team Phone # [phone] (Nights/Weekends)  11/28/2024, 4:45 PM

## 2024-11-28 NOTE — Consult Note (Signed)
 Chief Complaint: Nausea/vomiting Oncologist-Dr. Audery Referring Provider: Burnard Cunning MD    Gastroenterologist-Dr. Charlanne  ASSESSMENT AND PLAN;   #1.  Worsening N/V - d/t ?Cisplatin  based chemo. Pt with antral stenosis, gastric stasis without mechanical outlet obstruction on recent UGI series and EGD.  #2. Dilated SB loops- may represent functional obs d/t known peritoneal carcinomatosis, impaired peristalsis (Malignant ileus). Barium does reach colon in 3 hrs.  #3. Stage IV Gastric Adeno Ca  Plan: - Continue supportive treatment with IVF, IV protonix , IV reglan /phenergan  - Will discuss with Dr. Wilhelmenia in AM regarding gastric stenting.  - Trend CBC, CMP. - Suggest onc consultation in AM. - Will follow along.   HPI:    Henry May is a 58 y.o. male  Known to me with Gastric Ca, DVT s/p IVC filter (no AC)  Dx with Gastric Ca Dec 2024 with peritoneal mets, malignant ascites despite oxaliplatin  based chemo With continued nausea/vomiting/early satiety/weight loss  CT Abdo/pelvis initially showed gastric thickening with dilated small bowel loops Underwent upper GI with small bowel series 11/28 followed by EGD 12/4 which confirmed tumor progression.  Both upper GI series and EGD revealed antral stenosis which did allow passage of scope and contrast.  Dilated small bowel loops without any definite obstruction.  The contrast did reach colon in 3 hours.  Started on cisplatin  based chemo first dose 11/26/2024  Admitted with intractable nausea/vomiting/coffee-ground emesis with stable hemoglobin (Hb 14.5 - baseline) and unable to keep anything down.  Repeat CT Abdo/pelvis again showing proximal dilated small bowel loops- ?Mild obstruction but unchanged.  Admitted for supportive treatment, any further GI intervention.  Currently on IV fluids, IV Zofran , IV Protonix , IV Compazine  and also started on Unasyn .  No issues with diarrhea or constipation  Wt loss 40lb since sept  2025   Recent GI workup: CT Abdo/pelvis with contrast 11/27/2024 1. Dilated proximal small bowel, suggesting small bowel obstruction, although without a discrete transition point. 2. Thick-walled gastric body/antrum in this patient with history of gastric cancer, without definite focal mass, with mild nodularity along the pylorus. 3. Scarring with superimposed patchy peribronchovascular nodularity in the bilateral lower lobes, right greater than left, suggesting mild aspiration/pneumonia. 4. Additional ancillary findings, as above.  Upper GI series with small bowel follow-through 11/12/2024 1. Persistent narrowing at the gastric antrum and corresponds with the wall thickening on the recent CT. There are filling defects at this area of narrowing that could represent gastric contents but indeterminate. This could be further evaluated with direct visualization. 2. Normal gastric emptying. 3. Dilated duodenum and proximal jejunum. Contrast moved into the right colon after 3 hours. No evidence for a bowel obstruction   EGD 11/18/2024 - LA Grade D reflux esophagitis with no bleeding. Bx-squamous mucosa with focal ulceration. - Small hiatal hernia.  - Malignant infiltrative gastric tumor in the gastric body and in the gastric antrum with antral narrowing. The tumor has definitely progressed as compared to previous EGD. 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 - I do not believe gastric stenting will be helpful at this time ( may need it in future) . I have reviewed the upper GI with small bowel series performed recently. There does not seem to be any mechanical obstruction amenable to stenting in the small bowel either. I will run it by Dr. Wilhelmenia.    Past Medical History:  Diagnosis Date   Acute respiratory failure (HCC) 08/06/2020  Chronic cough    SINCE 08-06-2020  COVID PNEUMONIA   COVID 08/06/2020   Dvt femoral (deep venous thrombosis) (HCC) 08/2020   LEFT LEG    GERD (gastroesophageal reflux disease)    History of blood transfusion 08/2020   AFTER MI 5 UNITS GIVEN   History of kidney stones    Hypertension    Numbness    LEFT LEG AT TIMES   Pneumonia 08/06/2020   COVID PNEUMONIA    Past Surgical History:  Procedure Laterality Date   CYSTOSCOPY WITH STENT PLACEMENT Left 10/06/2020   Procedure: CYSTOSCOPY WITH STENT PLACEMENT;  Surgeon: Devere Lonni Righter, MD;  Location: [facility];  Service: Urology;  Laterality: Left;   CYSTOSCOPY/URETEROSCOPY/HOLMIUM LASER/STENT PLACEMENT Left 11/14/2020   Procedure: CYSTOSCOPY/URETEROSCOPY/HOLMIUM LASER/STENT PLACEMENT;  Surgeon: Devere Lonni Righter, MD;  Location: [facility];  Service: Urology;  Laterality: Left;  ONLY NEEDS 45 MIN   IR IMAGING GUIDED PORT INSERTION  01/22/2024   IR IVC FILTER PLMT / S&I /IMG GUID/MOD SED  08/24/2020   IR PARACENTESIS  01/22/2024    Family History  Problem Relation Age of Onset   Cancer Mother        type unknown   Diabetes Mellitus II Neg Hx    Colon cancer Neg Hx    Esophageal cancer Neg Hx    Rectal cancer Neg Hx    Stomach cancer Neg Hx     Social History[1]  Current Facility-Administered Medications  Medication Dose Route Frequency Provider Last Rate Last Admin   0.9 %  sodium chloride  infusion   Intravenous Continuous Jerrol Agent, MD 125 mL/hr at 11/28/24 0828 New Bag at 11/28/24 0828   acetaminophen  (TYLENOL ) tablet 650 mg  650 mg Oral Q6H PRN Fausto Sor A, DO       Or   acetaminophen  (TYLENOL ) suppository 650 mg  650 mg Rectal Q6H PRN Fausto Sor A, DO       Ampicillin -Sulbactam (UNASYN ) 3 g in sodium chloride  0.9 % 100 mL IVPB  3 g Intravenous Q6H BellRosaline DASEN, RPH   Stopped at 11/28/24 1404   dextrose  5 % in lactated ringers  infusion   Intravenous Continuous Fausto Sor A, DO 75 mL/hr at 11/28/24 1240 New  Bag at 11/28/24 1240   ketamine  (KETALAR ) injection 20 mg  20 mg Intravenous Once Floyd, Dan, DO       metoCLOPramide  (REGLAN ) injection 10 mg  10 mg Intravenous Q6H Celinda Alm Lot, MD       pantoprazole  (PROTONIX ) injection 40 mg  40 mg Intravenous Q6H Jerrol Agent, MD   40 mg at 11/28/24 0825   Followed by   NOREEN ON 12/01/2024] pantoprazole  (PROTONIX ) injection 40 mg  40 mg Intravenous Q12H Jerrol Agent, MD       promethazine  (PHENERGAN ) 12.5 mg in sodium chloride  0.9 % 50 mL IVPB  12.5 mg Intravenous Q6H PRN Fausto Sor LABOR, DO       Current Outpatient Medications  Medication Sig Dispense Refill   aluminum-magnesium  hydroxide 200-200 MG/5ML suspension Take 10 mLs by mouth every 6 (six) hours as needed for indigestion.     dexamethasone  (DECADRON ) 4 MG tablet Take 1 tablet (4 mg total) by mouth daily. Take in morning 30 tablet 1   famotidine  (PEPCID ) 20 MG tablet Take 20 mg by mouth 2 (two) times daily.     lidocaine -prilocaine  (EMLA ) cream Apply 1 Application topically as directed. Apply 1/2 tablespoon to port site 1-2 hours prior to stick and cover with Press-and-Seal  to numb port site. Do not start until 14 days of port placement. 30 g 3   LORazepam  (ATIVAN ) 0.5 MG tablet Place 1 tablet (0.5 mg total) under the tongue every 6 (six) hours as needed (nausea). 60 tablet 0   metoCLOPramide  (REGLAN ) 10 MG tablet Take 1 tablet (10 mg total) by mouth every 6 (six) hours. 120 tablet 0   omeprazole  (PRILOSEC) 40 MG capsule Take 1 capsule (40 mg total) by mouth 2 (two) times daily. 180 capsule 2   ondansetron  (ZOFRAN ) 8 MG tablet Take 1 tablet (8 mg total) by mouth every 8 (eight) hours as needed for nausea. May start 72 hours after chemotherapy treatment given due to receiving Aloxi  on day 1 30 tablet 1   scopolamine  (TRANSDERM-SCOP) 1 MG/3DAYS Place 1 patch (1 mg total) onto the skin every 3 (three) days as needed. Apply patch to skin and change every 72 hours as needed for  nausea due to gastric cancer 10 patch 2   sucralfate  (CARAFATE ) 1 GM/10ML suspension Take 10 mLs (1 g total) by mouth 4 (four) times daily. 420 mL 4   prochlorperazine  (COMPAZINE ) 25 MG suppository Place 1 suppository (25 mg total) rectally every 12 (twelve) hours as needed for nausea or vomiting. (Patient not taking: No sig reported) 12 suppository 0   Facility-Administered Medications Ordered in Other Encounters  Medication Dose Route Frequency Provider Last Rate Last Admin   sodium chloride  flush (NS) 0.9 % injection 10 mL  10 mL Intravenous PRN Cloretta Arley NOVAK, MD   10 mL at 02/16/24 1129    Allergies[2]  Review of Systems:       Physical Exam:    BP 134/84   Pulse 74   Temp 98.9 F (37.2 C) (Oral)   Resp 10   SpO2 96%  Wt Readings from Last 3 Encounters:  11/26/24 71.7 kg  11/22/24 74.3 kg  11/18/24 76.7 kg   Constitutional: Was drowsy, in no acute distress.  Cachectic HEENT: Pupils normal.  Conjunctivae are normal. No scleral icterus. Cardiovascular: Normal rate, regular rhythm. No edema Pulmonary/chest: Effort normal and breath sounds normal. No wheezing, rales or rhonchi. Abdominal: Soft, nondistended. Nontender. Bowel sounds decreased but active.  There are no masses palpable. No hepatomegaly. Rectal: Deferred Neurological: Alert and oriented to person place and time. Skin: Skin is warm and dry. No rashes noted.  Data Reviewed: I have personally reviewed following labs and imaging studies  CBC:    Latest Ref Rng & Units 11/27/2024    7:39 PM 11/24/2024    1:30 PM 11/09/2024    9:10 AM  CBC  WBC 4.0 - 10.5 K/uL 7.1  6.2  6.7   Hemoglobin 13.0 - 17.0 g/dL 85.4  85.3  85.8   Hematocrit 39.0 - 52.0 % 41.5  42.5  42.0   Platelets 150 - 400 K/uL 274  254  259     CMP:    Latest Ref Rng & Units 11/27/2024    7:39 PM 11/26/2024    9:17 AM 11/24/2024    1:30 PM  CMP  Glucose 70 - 99 mg/dL 877  894  886   BUN 6 - 20 mg/dL 29  24  26    Creatinine 0.61 -  1.24 mg/dL 9.16  9.16  9.09   Sodium 135 - 145 mmol/L 135  138  139   Potassium 3.5 - 5.1 mmol/L 4.0  3.7  3.8   Chloride 98 - 111 mmol/L 97  99  99  CO2 22 - 32 mmol/L 25  26  27    Calcium  8.9 - 10.3 mg/dL 89.9  89.8  9.7   Total Protein 6.5 - 8.1 g/dL 7.7     Total Bilirubin 0.0 - 1.2 mg/dL 0.6     Alkaline Phos 38 - 126 U/L 101     AST 15 - 41 U/L 25     ALT 0 - 44 U/L 62       GFR: Estimated Creatinine Clearance: 96 mL/min (by C-G formula based on SCr of 0.83 mg/dL). Liver Function Tests: Recent Labs  Lab 11/27/24 1939  AST 25  ALT 62*  ALKPHOS 101  BILITOT 0.6  PROT 7.7  ALBUMIN  4.0   Recent Labs  Lab 11/27/24 1939  LIPASE 171*   No results for input(s): AMMONIA in the last 168 hours. Coagulation Profile: Recent Labs  Lab 11/27/24 1939  INR 1.1      Radiology Studies: CT ABDOMEN PELVIS W CONTRAST Result Date: 11/27/2024 EXAM: CT ABDOMEN AND PELVIS WITH CONTRAST 11/27/2024 09:19:48 PM TECHNIQUE: CT of the abdomen and pelvis was performed with the administration of 100 mL of iohexol  (OMNIPAQUE ) 300 MG/ML solution. Multiplanar reformatted images are provided for review. Automated exposure control, iterative reconstruction, and/or weight-based adjustment of the mA/kV was utilized to reduce the radiation dose to as low as reasonably achievable. COMPARISON: 11/02/2024 CLINICAL HISTORY: Abd pain, coffee ground emesis, elevated lipase, hx of gastric cancer FINDINGS: LOWER CHEST: Scarring with superimposed patchy peribronchovascular nodularity in the bilateral lower lobes, right greater than left (image 9), suggesting mild aspiration/pneumonia. LIVER: The liver is unremarkable. GALLBLADDER AND BILE DUCTS: Gallbladder is unremarkable. No biliary ductal dilatation. SPLEEN: No acute abnormality. PANCREAS: No acute abnormality. ADRENAL GLANDS: No acute abnormality. KIDNEYS, URETERS AND BLADDER: 5 mm nonobstructing right lower pole renal calculus. No stones in the ureters. No  hydronephrosis. No perinephric or periureteral stranding. Urinary bladder is unremarkable. GI AND BOWEL: Fluid within the distal esophagus. Thick walled gastric body/antrum (image 17), chronic, without definite focal mass, although there is mild nodularity along the pylorus (image 21). Dilated proximal small bowel (image 32), suggesting small bowel obstruction, although without focal transition point. PERITONEUM AND RETROPERITONEUM: Small volume pelvic ascites. No free air. VASCULATURE: Aorta is normal in caliber. IVC filter. LYMPH NODES: No lymphadenopathy. REPRODUCTIVE ORGANS: The prostate is unremarkable. BONES AND SOFT TISSUES: Mild degenerative changes of the visualized thoracolumbar spine. No acute osseous abnormality. No focal soft tissue abnormality. IMPRESSION: 1. Dilated proximal small bowel, suggesting small bowel obstruction, although without a discrete transition point. 2. Thick-walled gastric body/antrum in this patient with history of gastric cancer, without definite focal mass, with mild nodularity along the pylorus. 3. Scarring with superimposed patchy peribronchovascular nodularity in the bilateral lower lobes, right greater than left, suggesting mild aspiration/pneumonia. 4. Additional ancillary findings, as above. Electronically signed by: Pinkie Pebbles MD 11/27/2024 09:26 PM EST RP Workstation: [id number]   DG Chest Portable 1 View Result Date: 11/27/2024 EXAM: 1 VIEW(S) XRAY OF THE CHEST 11/27/2024 07:47:51 PM COMPARISON: 02/05/2024 CLINICAL HISTORY: coffee ground emesis, weakness FINDINGS: LINES, TUBES AND DEVICES: Right IJ port catheter in place. LUNGS AND PLEURA: Patchy airspace opacities in right mid lung. No pleural effusion. No pneumothorax. HEART AND MEDIASTINUM: No acute abnormality of the cardiac and mediastinal silhouettes. BONES AND SOFT TISSUES: Thoracic spondylosis. IMPRESSION: 1. Patchy airspace opacities in the right mid lung, which may reflect aspiration or infection.  Electronically signed by: Greig Pique MD 11/27/2024 07:51 PM EST RP Workstation: [id number]  DG UGI W SMALL BOWEL SINGLE CM Result Date: 11/12/2024 CLINICAL DATA:  History of gastric cancer and status post chemotherapy. Persistent nausea and reflux symptoms. Oncology concern for possible gastric outlet obstruction. Consult for upper GI with small bowel follow-through study. EXAM: DG UGI W/ SMALL BOWEL TECHNIQUE: Combined double and single contrast examination was performed using effervescent crystals, high-density barium and thin liquid barium. Subsequently, serial images of the small bowel were obtained including spot views of the terminal ileum. This exam was performed by Kimble Clas, PA-C, and was supervised and interpreted by Dr. Juliene Balder. FLUOROSCOPY: Radiation Exposure Index (as provided by the fluoroscopic device): 90.40 mGy Kerma COMPARISON:  CT abdomen pelvis 11/02/2024 FINDINGS: Esophagus: Normal appearance. Esophageal motility: Within normal limits. Gastroesophageal reflux: None visualized. Ingested 13 mm barium tablet: Not given Stomach: Persistent narrowing in the distal stomach near the antrum with filling defects. Filling defects could be associated with gastric contents. The area of gastric narrowing corresponds with the wall thickening on the recent CT. Gastric emptying: Normal. Duodenum: Small duodenal diverticulum. Mild to moderate dilatation of the duodenum and proximal jejunum. Small bowel: Dilatation of the proximal jejunum. Normal caliber of the distal small bowel. Normal appearance of the terminal ileum. Contrast in the right colon after 3 hours. IMPRESSION: 1. Persistent narrowing at the gastric antrum and corresponds with the wall thickening on the recent CT. There are filling defects at this area of narrowing that could represent gastric contents but indeterminate. This could be further evaluated with direct visualization. 2. Normal gastric emptying. 3. Dilated duodenum and  proximal jejunum. Contrast moved into the right colon after 3 hours. No evidence for a bowel obstruction. Electronically Signed   By: Juliene Balder M.D.   On: 11/12/2024 14:17   CT ABDOMEN PELVIS W CONTRAST Result Date: 11/03/2024 EXAM: CT ABDOMEN AND PELVIS WITH CONTRAST 11/02/2024 02:24:55 PM TECHNIQUE: CT of the abdomen and pelvis was performed with the administration of intravenous contrast. Multiplanar reformatted images are provided for review. Automated exposure control, iterative reconstruction, and/or weight-based adjustment of the mA/kV was utilized to reduce the radiation dose to as low as reasonably achievable. CONTRAST: 100 mL of Omnipaque  300. COMPARISON: CT Abdomen and Pelvis 09/01/2024. CLINICAL HISTORY: Restaging gastric cancer with increased epigastric pain and reflux. * Tracking Code: BO * FINDINGS: LOWER CHEST: Chronic interstitial accentuation in the lung bases with mild scarring in the lingula and right middle lobe. LIVER: The liver is unremarkable. GALLBLADDER AND BILE DUCTS: Gallbladder is unremarkable. No biliary ductal dilatation. SPLEEN: No acute abnormality. PANCREAS: No acute abnormality. ADRENAL GLANDS: No acute abnormality. KIDNEYS, URETERS AND BLADDER: Millimeter (mm) right mid kidney nonobstructive renal calculus and mm 4 right mid to upper kidney nonobstructive renal calculus. No hydronephrosis. No perinephric or periureteral stranding. Urinary bladder is unremarkable. GI AND BOWEL: Diffuse mild wall thickening in the distal gastric body and the antrum without a well defined focal mass. This is shown for example on image 23, series 2. Small periampullary duodenal diverticulum. Wall thickening along loops of proximal jejunum, some of which may be from nondistention, although proximal enteritis is not excluded, for example anteriorly on image 43, series 2. There is no bowel obstruction. PERITONEUM AND RETROPERITONEUM: Small amount of pelvic ascites image 74, series 2. Small amount of  ascites along the left omentum without obvious nodularity. No free air. VASCULATURE: Infrarenal Inferior Vena Cava (IVC) filter. Aorta is normal in caliber. LYMPH NODES: Indistinctly marginated right gastric lymph node 1.0 cm in diameter on image 21, series  2 mildly increased in prominence compared to the previous study. REPRODUCTIVE ORGANS: No acute abnormality. BONES AND SOFT TISSUES: No acute osseous abnormality. Lower thoracic spondylosis. Mild degenerative arthropathy of both hips. Small bilateral erect inguinal hernia is contained out of soft tissue. IMPRESSION: 1. Diffuse mild wall thickening in the distal gastric body and antrum without a well-defined focal mass, similar to previous. 2. Indistinctly marginated right gastric lymph node measuring 1.0 cm, mildly increased in prominence compared to prior, borderline enlarged. Surveillance suggested. 3. Small volume ascites in the pelvis and along the left omentum without obvious nodularity. 4. Proximal jejunal mural thickening with possible nondistention versus proximal enteritis; correlation with symptoms and short-interval clinical/imaging follow-up as indicated. 5. Chronic interstitial lung disease with mild scarring in the lingula and right middle lobe. 6. Lower thoracic spondylosis. 7. Mild degenerative arthropathy of both hips. Electronically signed by: Ryan Salvage MD 11/03/2024 03:52 PM EST RP Workstation: [id number]      Anselm Bring, MD 11/28/2024, 2:43 PM  Cc: No ref. provider found       [1] Social History Tobacco Use   Smoking status: Former    Current packs/day: 0.00    Types: Cigarettes    Quit date: 1986    Years since quitting: 39.9    Passive exposure: Never   Smokeless tobacco: Never  Vaping Use   Vaping status: Never Used  Substance Use Topics   Alcohol use: Yes    Comment: occasional   Drug use: Never  [2] Allergies Allergen Reactions   Fluorouracil  Other (See Comments)    5-FU induced psychosis. See  progress note from 02/11/24

## 2024-11-28 NOTE — ED Notes (Addendum)
 Patient's O2 was found to be 91-93% on RA. Patient denied any breathing issues. Put patient on 1L [place] and O2 is now 95%. Informed the Dr. Fausto.

## 2024-11-28 NOTE — Assessment & Plan Note (Signed)
 Patient has reportedly no relief with any medications that are been tried so far and not even able to keep down spoonfuls of water  in recent days.   -- Continue IV Compazine  as needed --Titrate antiemetic regimen --Palliative care consulted for symptom management and goals of care

## 2024-11-28 NOTE — ED Provider Notes (Addendum)
 Assumed care of the patient at 0 700.  Briefly patient seen last night found to have pancreatitis and possible small bowel obstruction.  I was notified by nursing that the patient had continued emesis overnight.  No NG tube was placed initially.    I placed an order for an NG tube and was notified that patient was thought to not be a good candidate for NG tube placement.  I reviewed the notes and it does appear that GI recommended against placing an NG tube.  I am concerned as the patient has had persistent emesis here, even while sleeping makes him high risk for aspiration.  I called GI and let them know that the patient is having persistent vomiting despite multiple rounds of antiemetics.  Patient has been waiting around 11 hours for a bed.  No obvious bed available for bed placement.  I discussed case with Dr. Neysa who accepts the patient in ED to ED transfer to [facility].   I had a discussion after this with Dr. Charlanne, GI he recommended that the patient go to [facility].   I did discuss this with the doctor there and arrange for transfer to [facility]. I discussed with the hospitalist who changed the admission order for bed at [facility].  I was notified that charge nurse and [facility] had discussed the case with GI and had recommended that the patient go to [place].  [facility] has taken the patient to [facility] ED.      Emil Share, DO 11/28/24 9096    Emil Share, DO 11/28/24 1012

## 2024-11-28 NOTE — Progress Notes (Signed)
 Plan of Care Note for accepted transfer   Patient: Henry May MRN: [id number]   DOA: 11/27/2024  Facility requesting transfer: [facility]   Requesting Provider: Dr. Haze   Reason for transfer: Coffee-ground emesis, possible SBO   Facility course: 57 yr old man with hx of DVT with IVC filter, gastric cancer on chemotherapy, and recent EGD with esophagitis and ulcerated mass who presents with N/V and coffee ground emesis.   Initial Hgb is normal. CXR shows patchy airspace disease on the right concerning for aspiration. CT is concerning for possible SBO.   GI (Dr. Charlanne) was consulted by the ED and recommended supportive care but no NGT in light of ulcerated gastric mass. Surgery (Dr. Debby) was also consulted and deferred to GI.   He was given IV PPI, IVF, Unasyn , and Reglan  in the ED.   Plan of care: The patient is accepted for admission to Progressive unit, at [facility].   Author: Evalene GORMAN Sprinkles, MD 11/28/2024  Check [email or web address] for on-call coverage.  Nursing staff, Please call [facility] Admits & Consults System-Wide number on Amion as soon as patient's arrival, so appropriate admitting provider can evaluate the pt.

## 2024-11-28 NOTE — Assessment & Plan Note (Signed)
 S/p IVC filter

## 2024-11-28 NOTE — Assessment & Plan Note (Signed)
 CT abdomen pelvis in the ED showed dilated proximal small bowel without a discrete transition point. --GI is following --Surgery was contacted by EDP, deferring management to GI at this time --Patient does not have NG tube in place at this time --N.p.o. and IV fluids for now --IV antiemetics --Monitor renal function and electrolytes

## 2024-11-28 NOTE — Assessment & Plan Note (Signed)
 No recent A1c available.  Glucose on CMP in the ED was 122.  Patient is n.p.o. --Start CBGs and sliding scale insulin  if needed

## 2024-11-28 NOTE — Assessment & Plan Note (Addendum)
 Related to patient's ulcerative gastric mass.  Hemoglobin normal and vital stable upon admission --Admit to progressive --GI is following, appreciate their management --IV Protonix  --Fluids: D5 LR --N.p.o. -- Trend hemoglobin transfuse for less than 7, or less than 8 with active bleeding

## 2024-11-28 NOTE — H&P (Addendum)
 Telemedicine History and Physical    Patient: Henry May DOB: 1966/11/11 DOA: 11/27/2024 DOS: the patient was seen and examined on 11/28/2024 PCP: [name], DO   Referring Provider:  Dr. Haze  Telemedicine Provider: Burnard Cunning, DO Patient Location: [facility] Referring Diagnosis: Hematemis, SBO,   Patient Name and DOB verified: Henry May, [birth year] Patient consented to Telemedicine Evaluation:yes RN virtual assistant: Cortney Mas Video encounter time and date: 11/28/24 8:54 AM   Patient coming from: Home  Chief Complaint: No chief complaint on file.  HPI: Henry May is a 58 y.o. male with medical history significant of hx of DVT with IVC filter, recently diagnosed gastric cancer on chemotherapy, and EGD 1 week ago showing esophagitis and ulcerated mass who presented to [facility] ED for evaluation of nausea and vomiting with coffee ground emesis. Pt is drowsy from medications given, daughter and spouse at bedside assisted in providing history.  Patient has been on chemo since Feb this year for gastric cancer, had a scan in September that looked good, seemed to be responding to chemo. Since that time, patient has gone down hill -losing weight, eating less and less over time.  He reportedly had an EGD on 12/4 ago that showed grade D esophagitis without bleeding, and large infiltrative ulcerated circumferential mass in the gastric body and antrum.  Daughter reports patient was started on new chemotherapy 2 days ago on Friday, since then has developed worsening nausea and vomiting not relieved by any medications that have been tried thus far.  Emesis became coffee-ground appearing.  Patient reports vomiting triggered by that burning pain from esophagitis.  He has not even been able to keep down spoonfuls of water .  Anything he attempts to swallow makes him vomit.  Patient has had a slight cough but no fevers or chills.  No issues with diarrhea or  constipation  ED course -  Initial vitals: Temp 98.5 F, HR 103, RR 16, BP 124/91, SpO2 93-97% on room air. Labs obtained including CMP and CBC were notable for chloride 97, glucose 122, BUN 29, ALT 62, normal hemoglobin 14.5, no leukocytosis WBC 7.1. Lipase 171. Imaging --  CT abdomen pelvis with contrast showed dilated proximal small bowel suggesting SBO but without discrete transition point, thick walled gastric body/antrum without definite focal mass, mild nodularity along the pylorus, scarring with superimposed patchy peribronchovascular nodularity in the bilateral lower lobes right more than left suggesting mild aspiration or pneumonia.  Patient was treated in the ED with IV Benadryl  and Reglan , IV Zofran , IV Protonix , IV Compazine , 1 L bolus NS fluids and continued at 125/h, started on Unasyn .  Patient is being admitted to progressive unit for further evaluation and management as outlined in detail below.  Gastroenterology is following closely.     Review of Systems: As mentioned in the history of present illness. All other systems reviewed and are negative.   Past Medical History:  Diagnosis Date   Acute respiratory failure (HCC) 08/06/2020   Chronic cough    SINCE 08-06-2020 COVID PNEUMONIA   COVID 08/06/2020   Dvt femoral (deep venous thrombosis) (HCC) 08/2020   LEFT LEG    GERD (gastroesophageal reflux disease)    History of blood transfusion 08/2020   AFTER MI 5 UNITS GIVEN   History of kidney stones    Hypertension    Numbness    LEFT LEG AT TIMES   Pneumonia 08/06/2020   COVID PNEUMONIA   Past Surgical History:  Procedure Laterality Date  CYSTOSCOPY WITH STENT PLACEMENT Left 10/06/2020   Procedure: CYSTOSCOPY WITH STENT PLACEMENT;  Surgeon: Devere Lonni Righter, MD;  Location: [facility];  Service: Urology;  Laterality: Left;   CYSTOSCOPY/URETEROSCOPY/HOLMIUM LASER/STENT PLACEMENT Left 11/14/2020   Procedure: CYSTOSCOPY/URETEROSCOPY/HOLMIUM LASER/STENT  PLACEMENT;  Surgeon: Devere Lonni Righter, MD;  Location: [facility];  Service: Urology;  Laterality: Left;  ONLY NEEDS 45 MIN   IR IMAGING GUIDED PORT INSERTION  01/22/2024   IR IVC FILTER PLMT / S&I /IMG GUID/MOD SED  08/24/2020   IR PARACENTESIS  01/22/2024   Social History:  reports that he quit smoking about 39 years ago. His smoking use included cigarettes. He has never been exposed to tobacco smoke. He has never used smokeless tobacco. He reports current alcohol use. He reports that he does not use drugs.  Allergies[1]  Family History  Problem Relation Age of Onset   Cancer Mother        type unknown   Diabetes Mellitus II Neg Hx    Colon cancer Neg Hx    Esophageal cancer Neg Hx    Rectal cancer Neg Hx    Stomach cancer Neg Hx     Prior to Admission medications  Medication Sig Start Date End Date Taking? Authorizing Provider  aluminum-magnesium  hydroxide 200-200 MG/5ML suspension Take 10 mLs by mouth every 6 (six) hours as needed for indigestion.    [provider]  dexamethasone  (DECADRON ) 4 MG tablet Take 1 tablet (4 mg total) by mouth daily. Take in morning 11/09/24   Cloretta Arley NOVAK, MD  lidocaine -prilocaine  (EMLA ) cream Apply 1 Application topically as directed. Apply 1/2 tablespoon to port site 1-2 hours prior to stick and cover with Press-and-Seal to numb port site. Do not start until 14 days of port placement. 01/27/24   Cloretta Arley NOVAK, MD  LORazepam  (ATIVAN ) 0.5 MG tablet Place 1 tablet (0.5 mg total) under the tongue every 6 (six) hours as needed (nausea). 11/09/24   Cloretta Arley NOVAK, MD  metoCLOPramide  (REGLAN ) 10 MG tablet Take 1 tablet (10 mg total) by mouth every 6 (six) hours. 11/15/24   Cloretta Arley NOVAK, MD  omeprazole  (PRILOSEC) 40 MG capsule Take 1 capsule (40 mg total) by mouth 2 (two) times daily. Patient not taking: Reported on 11/26/2024 11/18/24   Charlanne Groom, MD  ondansetron  (ZOFRAN ) 8 MG tablet Take 1 tablet (8 mg total) by mouth every 8 (eight)  hours as needed for nausea. May start 72 hours after chemotherapy treatment given due to receiving Aloxi  on day 1 01/27/24   Cloretta Arley NOVAK, MD  prochlorperazine  (COMPAZINE ) 25 MG suppository Place 1 suppository (25 mg total) rectally every 12 (twelve) hours as needed for nausea or vomiting. Patient not taking: Reported on 11/26/2024 11/04/24   Neysa Thersia RAMAN, PA-C  scopolamine  (TRANSDERM-SCOP) 1 MG/3DAYS Place 1 patch (1 mg total) onto the skin every 3 (three) days as needed. Apply patch to skin and change every 72 hours as needed for nausea due to gastric cancer 11/22/24   Cloretta Arley NOVAK, MD  sucralfate  (CARAFATE ) 1 GM/10ML suspension Take 10 mLs (1 g total) by mouth 4 (four) times daily. 11/18/24   Charlanne Groom, MD  traMADol  (ULTRAM ) 50 MG tablet Take 1 tablet (50 mg total) by mouth every 8 (eight) hours as needed. Do not drive while taking pain medication. Patient not taking: Reported on 11/26/2024 01/27/24   Debby Olam POUR, NP    Physical Exam: Vitals:   11/28/24 0700 11/28/24 0730 11/28/24 0800 11/28/24 9082  BP: (!) 143/88  132/89   Pulse: 68 69 77   Resp:   (!) 21   Temp:    98 F (36.7 C)  TempSrc:    Axillary  SpO2: 95% 95% 95%    Bedside physical exam was performed by RN listed above. Below exam findings are based on their in person physical exam findings and my observations during virtual encounter.  General exam: Awake but appears very drowsy, no acute distress HEENT: hearing grossly normal  Respiratory system: CTAB, no wheezes, rales or rhonchi, normal respiratory effort. Cardiovascular system: normal S1/S2, RRR, no peripheral edema Gastrointestinal system: Abdomen soft, diffusely tender on palpation, absent bowel sounds Central nervous system: Patient awake but drowsy, follows commands appropriately. no gross focal neurologic deficits, normal speech Extremities: moves all, no peripheral edema Skin: RN reports skin dry normal temp and no color changes on the lower  extremities Psychiatry: normal mood, congruent affect, judgement and insight appear normal   Data Reviewed:  Labs and diagnostic studies as reviewed in detail above  Assessment and Plan:  Med history pending at this time.  All p.o. meds currently on hold which.  * Coffee ground emesis Related to patient's ulcerative gastric mass.  Hemoglobin normal and vital stable upon admission --Admit to progressive --GI is following, appreciate their management --IV Protonix  --Fluids: D5 LR --N.p.o. -- Trend hemoglobin transfuse for less than 7, or less than 8 with active bleeding   Partial small bowel obstruction (HCC) CT abdomen pelvis in the ED showed dilated proximal small bowel without a discrete transition point. --GI is following --Surgery was contacted by EDP, deferring management to GI at this time --Patient does not have NG tube in place at this time --N.p.o. and IV fluids for now --IV antiemetics --Monitor renal function and electrolytes  Intractable nausea and vomiting Patient has reportedly no relief with any medications that are been tried so far and not even able to keep down spoonfuls of water  in recent days.   -- Continue IV Compazine  as needed --Titrate antiemetic regimen --Palliative care consulted for symptom management and goals of care  Gastric cancer [facility]) Patient with significant weight loss and above complications including hematemesis, partial bowel obstruction, refractory nausea vomiting since starting new chemo just couple days ago. -- Consult oncology and palliative care --Symptom management including pain control and antiemetics as needed, per orders  Diabetes mellitus without complication (HCC) No recent A1c available.  Glucose on CMP in the ED was 122.  Patient is n.p.o. --Start CBGs and sliding scale insulin  if needed  History of DVT (deep vein thrombosis) S/p IVC filter      Advance Care Planning: CODE STATUS-full code CODE STATUS was  addressed during virtual admission encounter.  Daughter and spouse at bedside report that patient has continued to express his wishes to fight and do everything he can.  He would want all efforts at resuscitation in the event of cardiac or respiratory arrest  Consults: Gastroenterology-Dr. Charlanne  Family Communication: Daughter and spouse at bedside  Severity of Illness: The appropriate patient status for this patient is INPATIENT. Inpatient status is judged to be reasonable and necessary in order to provide the required intensity of service to ensure the patient's safety. The patient's presenting symptoms, physical exam findings, and initial radiographic and laboratory data in the context of their chronic comorbidities is felt to place them at high risk for further clinical deterioration. Furthermore, it is not anticipated that the patient will be medically stable for discharge from  the hospital within 2 midnights of admission.   * I certify that at the point of admission it is my clinical judgment that the patient will require inpatient hospital care spanning beyond 2 midnights from the point of admission due to high intensity of service, high risk for further deterioration and high frequency of surveillance required.*  Author: Burnard DELENA Cunning, DO 11/28/2024 10:12 AM  For on call review [email or web address].      [1]  Allergies Allergen Reactions   Fluorouracil  Other (See Comments)    5-FU induced psychosis. See progress note from 02/11/24

## 2024-11-29 ENCOUNTER — Telehealth: Payer: Self-pay

## 2024-11-29 ENCOUNTER — Inpatient Hospital Stay

## 2024-11-29 ENCOUNTER — Inpatient Hospital Stay (HOSPITAL_COMMUNITY)

## 2024-11-29 DIAGNOSIS — K920 Hematemesis: Secondary | ICD-10-CM | POA: Diagnosis not present

## 2024-11-29 DIAGNOSIS — K56600 Partial intestinal obstruction, unspecified as to cause: Secondary | ICD-10-CM

## 2024-11-29 LAB — CBC
HCT: 38.4 % — ABNORMAL LOW (ref 39.0–52.0)
Hemoglobin: 12.7 g/dL — ABNORMAL LOW (ref 13.0–17.0)
MCH: 30 pg (ref 26.0–34.0)
MCHC: 33.1 g/dL (ref 30.0–36.0)
MCV: 90.6 fL (ref 80.0–100.0)
Platelets: 238 K/uL (ref 150–400)
RBC: 4.24 MIL/uL (ref 4.22–5.81)
RDW: 13.8 % (ref 11.5–15.5)
WBC: 4.7 K/uL (ref 4.0–10.5)
nRBC: 0 % (ref 0.0–0.2)

## 2024-11-29 LAB — COMPREHENSIVE METABOLIC PANEL WITH GFR
ALT: 45 U/L — ABNORMAL HIGH (ref 0–44)
AST: 18 U/L (ref 15–41)
Albumin: 3.4 g/dL — ABNORMAL LOW (ref 3.5–5.0)
Alkaline Phosphatase: 77 U/L (ref 38–126)
Anion gap: 10 (ref 5–15)
BUN: 23 mg/dL — ABNORMAL HIGH (ref 6–20)
CO2: 26 mmol/L (ref 22–32)
Calcium: 9 mg/dL (ref 8.9–10.3)
Chloride: 102 mmol/L (ref 98–111)
Creatinine, Ser: 0.85 mg/dL (ref 0.61–1.24)
GFR, Estimated: >60 mL/min (ref >60)
Glucose, Bld: 138 mg/dL — ABNORMAL HIGH (ref 70–99)
Potassium: 3.7 mmol/L (ref 3.5–5.1)
Sodium: 138 mmol/L (ref 135–145)
Total Bilirubin: 0.4 mg/dL (ref 0.0–1.2)
Total Protein: 6.6 g/dL (ref 6.5–8.1)

## 2024-11-29 MED ORDER — SODIUM CHLORIDE 0.9 % IV SOLN: INTRAVENOUS | Status: AC

## 2024-11-29 MED ORDER — DEXTROSE IN LACTATED RINGERS 5 % IV SOLN: INTRAVENOUS | Status: AC

## 2024-11-29 MED ORDER — LORAZEPAM 2 MG/ML IJ SOLN
0.5000 mg | Freq: Four times a day (QID) | INTRAMUSCULAR | Status: DC | PRN
  Administered 2024-11-29 – 2024-12-06 (×6): 0.5 mg via INTRAVENOUS
  Filled 2024-11-29 (×6): qty 1

## 2024-11-29 MED ORDER — ONDANSETRON HCL 4 MG/2ML IJ SOLN: 4.0000 mg | Freq: Four times a day (QID) | INTRAMUSCULAR | Status: DC | PRN

## 2024-11-29 MED ORDER — HALOPERIDOL LACTATE 5 MG/ML IJ SOLN
0.5000 mg | Freq: Three times a day (TID) | INTRAMUSCULAR | Status: AC
  Administered 2024-11-29 – 2024-11-30 (×3): 0.5 mg via INTRAVENOUS
  Filled 2024-11-29 (×3): qty 1

## 2024-11-29 MED ORDER — POTASSIUM CHLORIDE 10 MEQ/100ML IV SOLN
10.0000 meq | INTRAVENOUS | Status: AC
  Administered 2024-11-29 (×4): 10 meq via INTRAVENOUS
  Filled 2024-11-29: qty 100

## 2024-11-29 MED ORDER — SODIUM CHLORIDE 0.9 % IV SOLN
12.5000 mg | Freq: Four times a day (QID) | INTRAVENOUS | Status: DC
  Administered 2024-11-29 – 2024-12-07 (×32): 12.5 mg via INTRAVENOUS
  Filled 2024-11-29: qty 12.5
  Filled 2024-11-29 (×2): qty 0.5
  Filled 2024-11-29 (×8): qty 12.5
  Filled 2024-11-29: qty 0.5
  Filled 2024-11-29: qty 12.5
  Filled 2024-11-29: qty 0.5
  Filled 2024-11-29: qty 12.5
  Filled 2024-11-29 (×2): qty 0.5
  Filled 2024-11-29 (×6): qty 12.5
  Filled 2024-11-29 (×3): qty 0.5
  Filled 2024-11-29 (×8): qty 12.5
  Filled 2024-11-29 (×2): qty 0.5
  Filled 2024-11-29 (×3): qty 12.5
  Filled 2024-11-29: qty 0.5
  Filled 2024-11-29: qty 12.5
  Filled 2024-11-29: qty 0.5
  Filled 2024-11-29: qty 12.5
  Filled 2024-11-29: qty 0.5
  Filled 2024-11-29: qty 12.5

## 2024-11-29 MED ORDER — HYDRALAZINE HCL 20 MG/ML IJ SOLN: 10.0000 mg | INTRAMUSCULAR | Status: DC | PRN

## 2024-11-29 NOTE — Progress Notes (Signed)
 Daily Progress Note   Patient Name: Henry May       Date: 11/29/2024 DOB: July 19, 1966  Age: 58 y.o. MRN#: [id number] Attending Physician: Kathrin Mignon DASEN, MD Primary Care Physician: [name], DO Admit Date: 11/27/2024  Reason for Consultation/Follow-up: Non pain symptom management  Length of Stay: 1  Current Medications: Scheduled Meds:   Chlorhexidine  Gluconate Cloth  6 each Topical Daily   dexamethasone  (DECADRON ) injection  2 mg Intravenous Q12H   haloperidol  lactate  0.5 mg Intravenous Q8H   pantoprazole  (PROTONIX ) IV  40 mg Intravenous Q6H   Followed by   NOREEN ON 12/01/2024] pantoprazole  (PROTONIX ) IV  40 mg Intravenous Q12H    Continuous Infusions:  ampicillin -sulbactam (UNASYN ) IV 3 g (11/29/24 1116)   dextrose  5% lactated ringers  125 mL/hr at 11/29/24 1128   potassium chloride      promethazine  (PHENERGAN ) injection (IM or IVPB)      PRN Meds: acetaminophen  **OR** acetaminophen , hydrALAZINE , LORazepam , sodium chloride  flush  Physical Exam Vitals reviewed.  Constitutional:      Appearance: He is ill-appearing.  HENT:     Head: Normocephalic and atraumatic.  Cardiovascular:     Rate and Rhythm: Normal rate.  Pulmonary:     Effort: Pulmonary effort is normal.  Skin:    General: Skin is warm and dry.  Neurological:     Mental Status: He is alert and oriented to person, place, and time.             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   AKI (acute kidney injury) 02/01/2024   Gastric cancer (HCC) 01/26/2024   Knee pain 10/14/2022   Healthcare maintenance 10/14/2022   Diabetes  mellitus without complication (HCC) 10/31/2020   Cardiac arrest (HCC) 10/20/2020   History of DVT (deep vein thrombosis) 08/19/2020   Chest pain 08/19/2020   Acute hypoxemic respiratory failure due to COVID-19 [facility]) 08/08/2020   ARF (acute renal failure) 08/07/2020   Elevated LFTs 08/07/2020   Acute respiratory disease due to COVID-19 virus 08/07/2020   Pneumonia due to COVID-19 virus 08/07/2020   Acute respiratory failure due to COVID-19 [facility]) 08/06/2020    Palliative Care Assessment & Plan   Patient Profile: 58 y.o. male  with past medical history of gastric cancer (new chemotherapy 12/12), DVT with IVC filter admitted on 11/27/2024 with nausea and vomiting with coffee ground emesis.    EGD 12/4 showed grade D esophagitis without bleeding and malignant infiltrative gastric tumor in the gastric body and in the gastric antrum with antral narrowing.   CT abdomen/pelvis with contrast 12/13 showed dilated proximal small bowel suggesting SBO but without discrete transition point, thick walled gastric body/antrum without definite focal mass, mild nodularity along the pylorus.    GI is following.   Discussion: Reviewed chart. GI note states Dr. Wilhelmenia and Dr. Charlanne will discuss later today to see if they think that stenting would be beneficial. Patient sitting up in bed. He continues to have nausea/vomiting. Overnight he was vomiting every 20-30 minutes but now he has gone up to an hour without vomiting. He has been unable to sleep due to his reflux, nausea, and vomiting. His wife and daughter are at  bedside.  Patient new EKG shows QTc interval is improved at 436. Discussed starting 0.5mg  IV haldol  every 8 hours for three doses. Patient and family in agreement. PMT will follow up tomorrow.  Emotional support and therapeutic listening provided. Encouraged patient and family to call PMT with needs. PMT will continue to support  Recommendations/Plan: Full code/ full scope Optimize antiemetic support Continue PMT support  Symptom Management:  Continue dexamethasone  2mg  IV bid Start 0.5mg  IV haldol  q8hr for 3 doses    Code Status:    Code Status Orders  (From admission, onward)           Start     Ordered   11/28/24 1042  Full code  Continuous       Question:  By:  Answer:  Consent: discussion documented in EHR   11/28/24 1041         Extensive chart review has been completed prior to seeing the patient including labs, vital signs, imaging, progress/consult notes, orders, medications, and available advance directive documents.   Care plan was discussed with bedside RN and Dr. Kathrin  Time spent: 35 minutes  Thank you for allowing the Palliative Medicine Team to assist in the care of this patient.    Stephane CHRISTELLA Palin, NP  Please contact Palliative Medicine Team phone at [phone] for questions and concerns.

## 2024-11-29 NOTE — Progress Notes (Signed)
 PROGRESS NOTE  Henry May FMW:[id number] DOB: 1966/04/24   PCP: [name], DO  Patient is from: Home.  Lives with wife and daughter.  DOA: 11/27/2024 LOS: 1  Chief complaints No chief complaint on file.    Brief Narrative / Interim history: 58 year old M with PMH of DVT with IVC filter, gastric cancer on chemo and esophagitis presented to [facility] due to nausea, vomiting and coffee-ground emesis 2 days after he started new chemotherapy, and admitted for intractable nausea and vomiting, dilated small bowel loops and coffee-ground emesis.  Recent EGD on 12/4 with LA grade D esophagitis without bleeding, and large infiltrative, sessile and ulcerated circumferential mass with contact of bleeding in gastric body and antrum with antral narrowing and luminal diameter of approximately 1.5 cm  In ED, stable vitals.  CBC and CMP without significant finding.  Lipase 171.  CT abdomen and pelvis showed dilated proximal small bowel suggesting SBO but without discrete transition point, thick walled gastric body/antrum without definite focal mass, mild nodularity along the pylorus, scarring with superimposed patchy peribronchovascular nodularity in the bilateral lower lobes right more than left suggesting mild aspiration or pneumonia.  Patient was started on antiemetics, IV fluid, PPI and admitted for further care.  GI and palliative medicine consulted.    Subjective: Seen and examined earlier this morning.  No major events overnight or this morning.  Reports vomiting every 20 to 30 minutes.  Also reports abdominal pain.  Reports difficulty sleeping.    Assessment and plan: Intractable nausea and vomiting: Started 2 days after chemo for his gastric cancer.  Could be due to chemo or malignancy.  CT with dilated small bowel without transition point.  Recent EGD as above. - GI following and discussing about possible stenting. - Remains NPO.  Increase IV fluid - Continue IV Protonix  every 6  hours - Change Reglan  to Phenergan  - Continue Decadron  per palliative.  Coffee ground emesis: Likely from ulcerated gastric mass.  Also history of esophagitis.  Slight drop in hemoglobin likely dilutional. -Continue PPI as above -GI on board.   Dilated small bowel without transition point: KUB with contrast in colon.  Partial SBO? -Defer to GI.  Gastric cancer: Recently restarted on chemo.  Followed by Dr. Zorita.  -Added oncology to treatment team -Palliative medicine on board.  Possible aspiration pneumonitis/pneumonia: No respiratory symptoms.  CXR showed patchy airspace opacities in RML.  CT abdomen and pelvis with bibasilar infiltrate. -Continue IV Unasyn    Diet controlled diabetes: A1c 5.7%.  Not on medication at home. 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           DVT prophylaxis:  SCDs Start: 11/28/24 1042  Code Status: Full code Family Communication: Updated patient's wife and daughter at bedside Level of care: Progressive Status is: Inpatient Remains inpatient appropriate because: Intractable nausea and vomiting   Final disposition: Likely home once medically stable   55 minutes with more than 50% spent in reviewing records, counseling patient/family and coordinating care.  Consultants:  Gastroenterology Oncology Palliative medicine  Procedures: None  Microbiology summarized: None  Objective: Vitals:   11/28/24 1739 11/28/24 2139 11/29/24 0400 11/29/24 0511  BP: (!) 160/91 (!) 145/90 (!) 141/86   Pulse: 79 88 76   Resp: 20  17   Temp: 98 F (36.7 C) 98.4 F (36.9 C) 98.2 F (36.8 C)  TempSrc: Oral Oral Oral   SpO2: 96% 92% 95%   Weight:    70 kg  Height:    6' (1.829 m)    Examination:  GENERAL: No apparent distress.  Nontoxic. HEENT: MMM.  Vision and hearing grossly intact.  NECK: Supple.  No apparent JVD.   RESP:  No IWOB.  Fair aeration bilaterally. CVS:  RRR. Heart sounds normal.  ABD/GI/GU: BS+. Abd soft, NTND.  MSK/EXT:  Moves extremities. No apparent deformity. No edema.  SKIN: no apparent skin lesion or wound NEURO: AA.  Oriented appropriately.  No apparent focal neuro deficit. PSYCH: Calm. Normal affect.   Sch Meds:  Scheduled Meds:  Chlorhexidine  Gluconate Cloth  6 each Topical Daily   dexamethasone  (DECADRON ) injection  2 mg Intravenous Q12H   pantoprazole  (PROTONIX ) IV  40 mg Intravenous Q6H   Followed by   NOREEN ON 12/01/2024] pantoprazole  (PROTONIX ) IV  40 mg Intravenous Q12H   Continuous Infusions:  ampicillin -sulbactam (UNASYN ) IV 3 g (11/29/24 1116)   dextrose  5% lactated ringers      promethazine  (PHENERGAN ) injection (IM or IVPB)     PRN Meds:.acetaminophen  **OR** acetaminophen , hydrALAZINE , LORazepam , sodium chloride  flush  Antimicrobials: Anti-infectives (From admission, onward)    Start     Dose/Rate Route Frequency Ordered Stop   11/28/24 1200  Ampicillin -Sulbactam (UNASYN ) 3 g in sodium chloride  0.9 % 100 mL IVPB        3 g 200 mL/hr over 30 Minutes Intravenous Every 6 hours 11/28/24 1048     11/28/24 0045  Ampicillin -Sulbactam (UNASYN ) 3 g in sodium chloride  0.9 % 100 mL IVPB        3 g 200 mL/hr over 30 Minutes Intravenous  Once 11/28/24 0042 11/28/24 0134        I have personally reviewed the following labs and images: CBC: Recent Labs  Lab 11/24/24 1330 11/27/24 1939 11/28/24 1536 11/29/24 0250  WBC 6.2 7.1 4.9 4.7  NEUTROABS 4.2  --   --   --   HGB 14.6 14.5 13.3 12.7*  HCT 42.5 41.5 39.6 38.4*  MCV 86.9 87.9 90.4 90.6  PLT 254 274 239 238   BMP &GFR Recent Labs  Lab 11/24/24 1330 11/26/24 0917 11/27/24 1939 11/28/24 1536 11/29/24 0250  NA 139 138 135 138 138  K 3.8 3.7 4.0 3.8 3.7  CL 99 99 97* 102 102  CO2 27 26 25 25 26   GLUCOSE 113* 105* 122* 119* 138*  BUN 26* 24* 29* 26* 23*  CREATININE 0.90 0.83 0.83 0.87 0.85   CALCIUM  9.7 10.1 10.0 9.2 9.0  MG 2.4  --   --  2.4  --    Estimated Creatinine Clearance: 91.5 mL/min (by C-G formula based on SCr of 0.85 mg/dL). Liver & Pancreas: Recent Labs  Lab 11/27/24 1939 11/28/24 1536 11/29/24 0250  AST 25 21 18   ALT 62* 51* 45*  ALKPHOS 101 82 77  BILITOT 0.6 0.4 0.4  PROT 7.7 6.8 6.6  ALBUMIN  4.0 3.6 3.4*   Recent Labs  Lab 11/27/24 1939  LIPASE 171*   No results for input(s): AMMONIA in the last 168 hours. Diabetic: Recent Labs    11/27/24 1939 11/28/24 1536  HGBA1C 5.6 5.7*   No results for input(s): GLUCAP in the last 168 hours. Cardiac Enzymes: No results for input(s): CKTOTAL, CKMB, CKMBINDEX, TROPONINI in the last 168 hours. No results for input(s): PROBNP in the last 8760 hours. Coagulation Profile: Recent Labs  Lab 11/27/24 1939  INR 1.1  Thyroid  Function Tests: No results for input(s): TSH, T4TOTAL, FREET4, T3FREE, THYROIDAB in the last 72 hours. Lipid Profile: No results for input(s): CHOL, HDL, LDLCALC, TRIG, CHOLHDL, LDLDIRECT in the last 72 hours. Anemia Panel: No results for input(s): VITAMINB12, FOLATE, FERRITIN, TIBC, IRON, RETICCTPCT in the last 72 hours. Urine analysis:    Component Value Date/Time   COLORURINE YELLOW 11/04/2024 1812   APPEARANCEUR CLEAR 11/04/2024 1812   LABSPEC 1.025 11/04/2024 1812   PHURINE 5.5 11/04/2024 1812   GLUCOSEU NEGATIVE 11/04/2024 1812   HGBUR NEGATIVE 11/04/2024 1812   BILIRUBINUR NEGATIVE 11/04/2024 1812   KETONESUR 15 (A) 11/04/2024 1812   PROTEINUR TRACE (A) 11/04/2024 1812   NITRITE NEGATIVE 11/04/2024 1812   LEUKOCYTESUR NEGATIVE 11/04/2024 1812   Sepsis Labs: Invalid input(s): PROCALCITONIN, LACTICIDVEN  Microbiology: No results found for this or any previous visit (from the past 240 hours).  Radiology Studies: DG Abd 2 Views Result Date: 11/29/2024 EXAM: 2 VIEW XRAY OF THE ABDOMEN 11/29/2024 07:10:00 AM  COMPARISON: None available. CLINICAL HISTORY: SBO (small bowel obstruction) (HCC) FINDINGS: LINES, TUBES AND DEVICES: IVC filter present. Partially visualized central venous catheter with tip overlying the superior cavoatrial junction. BOWEL: Enteric contrast material is again noted within the colon up to the level of the rectum. The appearance is unchanged compared with the previous exam without significant antegrade progression. Air-filled loops of redundant sigmoid colon noted within the left upper quadrant of the abdomen, unchanged. Paucity of small bowel gas limits evaluation for obstruction. SOFT TISSUES: No abnormal calcifications. BONES: No acute fracture. IMPRESSION: 1. Paucity of small bowel gas limits evaluation for obstruction. 2. No significant change in the appearance of contrast material within the colon up to the rectum with air-filled loops of sigmoid colon in the left upper quadrant of the abdomen. Electronically signed by: Waddell Calk MD 11/29/2024 07:26 AM EST RP Workstation: [id number]      Henry May T. Tabias Swayze [facility]  If 7PM-7AM, please contact night-coverage [email or web address] 11/29/2024, 11:17 AM

## 2024-11-29 NOTE — Telephone Encounter (Signed)
 F/U first cisplatin  infusion Patient has been admitted to hospital. He received cisplatin  on 12/12 and was admitted to hospital on 12/13.

## 2024-11-29 NOTE — Progress Notes (Addendum)
 [facility] Progress Note  CC: Gastric cancer, nausea and vomiting  Subjective: Daughter and wife are at bedside.  Patient continuing to have vomiting every 20 to 30 minutes overnight, unable to sleep.  Objective:  Vital signs in last 24 hours: Temp:  [98 F (36.7 C)-98.9 F (37.2 C)] 98.2 F (36.8 C) (12/15 0400) Pulse Rate:  [70-88] 76 (12/15 0400) Resp:  [10-21] 17 (12/15 0400) BP: (134-160)/(81-91) 141/86 (12/15 0400) SpO2:  [92 %-97 %] 95 % (12/15 0400) Weight:  [70 kg] 70 kg (12/15 0511) Last BM Date : 12/03/24 General:  Alert, Well-developed, in NAD Heart:  Regular rate and rhythm; no murmurs Pulm:  CTAB.  No W/R/R. Abdomen:  Soft, non-distended.  BS present but quiet and sparse.  Non-tender. Extremities:  Without edema. Neurologic:  Alert and oriented x 4;  grossly normal neurologically. Psych:  Alert and cooperative. Normal mood and affect.  Intake/Output from previous day: 12/14 0701 - 12/15 0700 In: 1104.9 [I.V.:711.6; IV Piggyback:393.3] Out: -   Lab Results: Recent Labs    11/27/24 1939 11/28/24 1536 11/29/24 0250  WBC 7.1 4.9 4.7  HGB 14.5 13.3 12.7*  HCT 41.5 39.6 38.4*  PLT 274 239 238   BMET Recent Labs    11/27/24 1939 11/28/24 1536 11/29/24 0250  NA 135 138 138  K 4.0 3.8 3.7  CL 97* 102 102  CO2 25 25 26   GLUCOSE 122* 119* 138*  BUN 29* 26* 23*  CREATININE 0.83 0.87 0.85  CALCIUM  10.0 9.2 9.0   LFT Recent Labs    11/29/24 0250  PROT 6.6  ALBUMIN  3.4*  AST 18  ALT 45*  ALKPHOS 77  BILITOT 0.4   PT/INR Recent Labs    11/27/24 1939  LABPROT 14.8  INR 1.1   DG Abd 2 Views Result Date: 11/29/2024 EXAM: 2 VIEW XRAY OF THE ABDOMEN 11/29/2024 07:10:00 AM COMPARISON: None available. CLINICAL HISTORY: SBO (small bowel obstruction) (HCC) FINDINGS: LINES, TUBES AND DEVICES: IVC filter present. Partially visualized central venous catheter with tip overlying the superior cavoatrial junction. BOWEL: Enteric  contrast material is again noted within the colon up to the level of the rectum. The appearance is unchanged compared with the previous exam without significant antegrade progression. Air-filled loops of redundant sigmoid colon noted within the left upper quadrant of the abdomen, unchanged. Paucity of small bowel gas limits evaluation for obstruction. SOFT TISSUES: No abnormal calcifications. BONES: No acute fracture. IMPRESSION: 1. Paucity of small bowel gas limits evaluation for obstruction. 2. No significant change in the appearance of contrast material within the colon up to the rectum with air-filled loops of sigmoid colon in the left upper quadrant of the abdomen. Electronically signed by: Waddell Calk MD 11/29/2024 07:26 AM EST RP Workstation: [id number]   CT ABDOMEN PELVIS W CONTRAST Result Date: 11/27/2024 EXAM: CT ABDOMEN AND PELVIS WITH CONTRAST 11/27/2024 09:19:48 PM TECHNIQUE: CT of the abdomen and pelvis was performed with the administration of 100 mL of iohexol  (OMNIPAQUE ) 300 MG/ML solution. Multiplanar reformatted images are provided for review. Automated exposure control, iterative reconstruction, and/or weight-based adjustment of the mA/kV was utilized to reduce the radiation dose to as low as reasonably achievable. COMPARISON: 11/02/2024 CLINICAL HISTORY: Abd pain, coffee ground emesis, elevated lipase, hx of gastric cancer FINDINGS: LOWER CHEST: Scarring with superimposed patchy peribronchovascular nodularity in the bilateral lower lobes, right greater than left (image 9), suggesting mild aspiration/pneumonia. LIVER: The liver is unremarkable. GALLBLADDER AND BILE DUCTS: Gallbladder is unremarkable. No  biliary ductal dilatation. SPLEEN: No acute abnormality. PANCREAS: No acute abnormality. ADRENAL GLANDS: No acute abnormality. KIDNEYS, URETERS AND BLADDER: 5 mm nonobstructing right lower pole renal calculus. No stones in the ureters. No hydronephrosis. No perinephric or periureteral  stranding. Urinary bladder is unremarkable. GI AND BOWEL: Fluid within the distal esophagus. Thick walled gastric body/antrum (image 17), chronic, without definite focal mass, although there is mild nodularity along the pylorus (image 21). Dilated proximal small bowel (image 32), suggesting small bowel obstruction, although without focal transition point. PERITONEUM AND RETROPERITONEUM: Small volume pelvic ascites. No free air. VASCULATURE: Aorta is normal in caliber. IVC filter. LYMPH NODES: No lymphadenopathy. REPRODUCTIVE ORGANS: The prostate is unremarkable. BONES AND SOFT TISSUES: Mild degenerative changes of the visualized thoracolumbar spine. No acute osseous abnormality. No focal soft tissue abnormality. IMPRESSION: 1. Dilated proximal small bowel, suggesting small bowel obstruction, although without a discrete transition point. 2. Thick-walled gastric body/antrum in this patient with history of gastric cancer, without definite focal mass, with mild nodularity along the pylorus. 3. Scarring with superimposed patchy peribronchovascular nodularity in the bilateral lower lobes, right greater than left, suggesting mild aspiration/pneumonia. 4. Additional ancillary findings, as above. Electronically signed by: Pinkie Pebbles MD 11/27/2024 09:26 PM EST RP Workstation: [id number]   DG Chest Portable 1 View Result Date: 11/27/2024 EXAM: 1 VIEW(S) XRAY OF THE CHEST 11/27/2024 07:47:51 PM COMPARISON: 02/05/2024 CLINICAL HISTORY: coffee ground emesis, weakness FINDINGS: LINES, TUBES AND DEVICES: Right IJ port catheter in place. LUNGS AND PLEURA: Patchy airspace opacities in right mid lung. No pleural effusion. No pneumothorax. HEART AND MEDIASTINUM: No acute abnormality of the cardiac and mediastinal silhouettes. BONES AND SOFT TISSUES: Thoracic spondylosis. IMPRESSION: 1. Patchy airspace opacities in the right mid lung, which may reflect aspiration or infection. Electronically signed by: Greig Pique MD  11/27/2024 07:51 PM EST RP Workstation: [id number]    Assessment / Plan: #1.  Worsening N/V - d/t ?Cisplatin  based chemo. Pt with antral stenosis, gastric stasis without mechanical outlet obstruction on recent UGI series and EGD.   #2. Dilated SB loops- may represent functional obs d/t known peritoneal carcinomatosis, impaired peristalsis (Malignant ileus). Barium does reach colon in 3 hrs.   #3. Stage IV Gastric Adeno Ca  #4  DVT status post IVC filter, no anticoagulation  -Dr. Wilhelmenia and Dr. Charlanne will discuss later today to see if they think that stenting would be beneficial. - Needs to continue IV fluids as he is not able to take much at all by mouth other than some ice chips due to ongoing vomiting. - Continue pantoprazole  IV. - Continue antiemetics.  Phenergan  not helping.  Had Reglan  on his list, but had borderline or prolonged QT interval so they are going to repeat an EKG this morning.  I think Reglan  may be beneficial for him if we are able to use it.    LOS: 1 day   Harlene BIRCH. Zehr  11/29/2024, 9:09 AM      Attending physician's note   I have taken history, reviewed the chart and examined the patient. I performed a substantive portion of this encounter, including complete performance of at least one of the key components, in conjunction with the APP. I agree with the Advanced Practitioner's note, impression and recommendations.   Discussed with Dr Agatha and reviewed CT x 2, UGI with SB series, prev EGD Pt with continued N/V  Plan: -Surgical consult for PSBO-on review of Cts, there may be transition point in the distal jejunum/ileum which may cause mechanical  obstruction.  Not sure what options would be available. - Plan EGD with +/- gastric stent tomorrow with Dr. Wilhelmenia.  I have explained risks and benefits including risks of stent migration, perforation, bleeding with the patient and patient's family.  I have also discussed that it may not be completely  helpful.  They do understand. - Keep patient NPO for now.    Anselm Bring, MD [facility] [phone]

## 2024-11-29 NOTE — TOC Initial Note (Signed)
 Transition of Care [facility]) - Initial/Assessment Note    Patient Details  Name: Henry May MRN: [id number] Date of Birth: 11-Jun-1966  [facility] CM/SW Contact:    Tawni CHRISTELLA Eva, LCSW Phone Number: 11/29/2024, 4:26 PM  Clinical Narrative:                  Pt being follow by [facility] for TPN. ICM to follow.    Barriers to Discharge: Continued Medical Work up   Patient Goals and CMS Choice            Expected Discharge Plan and Services                                              Prior Living Arrangements/Services                       Activities of Daily Living   ADL Screening (condition at time of admission) Independently performs ADLs?: Yes (appropriate for developmental age) Is the patient deaf or have difficulty hearing?: No Does the patient have difficulty seeing, even when wearing glasses/contacts?: No Does the patient have difficulty concentrating, remembering, or making decisions?: No  Permission Sought/Granted                  Emotional Assessment              Admission diagnosis:  SBO (small bowel obstruction) (HCC) [K56.609] Coffee ground emesis [K92.0] Aspiration pneumonia due to gastric secretions, unspecified laterality, unspecified part of lung (HCC) [J69.0] Malignant neoplasm of stomach, unspecified location [facility]) [C16.9] Patient Active Problem List   Diagnosis Date Noted   Coffee ground emesis 11/28/2024   Intractable nausea and vomiting 11/28/2024   Partial small bowel obstruction (HCC) 11/28/2024   Acute kidney injury (AKI) with acute tubular necrosis (ATN) 02/05/2024   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   AKI (acute kidney injury) 02/01/2024   Gastric cancer (HCC) 01/26/2024   Knee pain 10/14/2022   Healthcare maintenance 10/14/2022   Diabetes mellitus without complication (HCC) 10/31/2020   Cardiac arrest (HCC) 10/20/2020   History of DVT (deep vein thrombosis)  08/19/2020   Chest pain 08/19/2020   Acute hypoxemic respiratory failure due to COVID-19 (HCC) 08/08/2020   ARF (acute renal failure) 08/07/2020   Elevated LFTs 08/07/2020   Acute respiratory disease due to COVID-19 virus 08/07/2020   Pneumonia due to COVID-19 virus 08/07/2020   Acute respiratory failure due to COVID-19 (HCC) 08/06/2020   PCP:  [name], DO Pharmacy:   [facility] [phone] - [place], [place] - [address] [place] [address] Phone: [phone] Fax: [phone]     Social Drivers of Health (SDOH) Social History: SDOH Screenings   Food Insecurity: Patient Declined (11/28/2024)  Housing: Unknown (11/28/2024)  Transportation Needs: Patient Declined (11/28/2024)  Utilities: Patient Declined (11/28/2024)  Depression (PHQ2-9): Medium Risk (11/26/2024)  Social Connections: Unknown (04/30/2022)   Received from [facility]  Tobacco Use: Medium Risk (11/27/2024)   SDOH Interventions:     Readmission Risk Interventions    02/07/2024    8:40 AM  Readmission Risk Prevention Plan  Transportation Screening Complete  HRI or Home Care Consult Complete  Social Work Consult for Recovery Care Planning/Counseling Complete  Palliative Care Screening Not Applicable  Medication Review Ocean[employer]) Complete

## 2024-11-29 NOTE — Consult Note (Signed)
 Theodoro Cathrine 09/12/1966  [id number].    Requesting MD: Dr. Lynnie Bring Chief Complaint/Reason for Consult: SBO  HPI: Henry May is a 58 y.o. male with stage IV gastric cancer followed by Dr. Cloretta on cisplatin , prior DVT s/p IVC filter who we are asked to see for nausea and vomiting.  Patient family at bedside who helps provide history.  Patient reportedly has had progressive nausea and vomiting since early November to the point he is having constant nausea despite medications and vomiting every 20 minutes over the last several days.  He continues to pass flatus.  His last bowel movement was 2 days ago. He denies abdominal pain. He had a UGI with small bowel follow-through on 11/28 that showed narrowing of the gastric antrum, normal gastric emptying, dilated duodenum and proximal jejunum with contrast that moved into the right colon after 3 hours with no evidence of obstruction. EGD 12/4 with findings as below.  CT A/P 12/13 with dilated proximal small bowel without discrete transition point and thickened wall of the gastric body/antrum. GI planning EGD with +/- stent tomorrow. General surgery was asked to see about possible SBO. No prior abdominal surgeries. He is not on blood thinners at baseline.   EGD 11/18/24 - LA Grade D ( one or more mucosal breaks involving at least 75% of esophageal circumference) esophagitis with no bleeding was found in the middle and distal esophagus. Biopsies were taken with a cold forceps for histology.  - A small hiatal hernia was present.  - A large, infiltrative, sessile and ulcerated, circumferential mass with contact bleeding was found in the gastric body and in the gastric antrum. There was antral narrowing with luminal diameter of approximately 1. 5 cm. The scope could easily be passed beyond. Pylorus was normal. Biopsies were taken with a cold forceps for histology.  - Deep duodenal intubation was performed up to distal third portion of the  duodenum. The duodenum was dilated with significant retained bile/ fluid. This was aspirated. No obvious underlying masses or obstructive lesions were noted in the duodenum.  ROS: ROS As above, see hpi  Family History  Problem Relation Age of Onset   Cancer Mother        type unknown   Diabetes Mellitus II Neg Hx    Colon cancer Neg Hx    Esophageal cancer Neg Hx    Rectal cancer Neg Hx    Stomach cancer Neg Hx     Past Medical History:  Diagnosis Date   Acute respiratory failure (HCC) 08/06/2020   Chronic cough    SINCE 08-06-2020 COVID PNEUMONIA   COVID 08/06/2020   Dvt femoral (deep venous thrombosis) (HCC) 08/2020   LEFT LEG    GERD (gastroesophageal reflux disease)    History of blood transfusion 08/2020   AFTER MI 5 UNITS GIVEN   History of kidney stones    Hypertension    Numbness    LEFT LEG AT TIMES   Pneumonia 08/06/2020   COVID PNEUMONIA    Past Surgical History:  Procedure Laterality Date   CYSTOSCOPY WITH STENT PLACEMENT Left 10/06/2020   Procedure: CYSTOSCOPY WITH STENT PLACEMENT;  Surgeon: Devere Lonni Righter, MD;  Location: [facility];  Service: Urology;  Laterality: Left;   CYSTOSCOPY/URETEROSCOPY/HOLMIUM LASER/STENT PLACEMENT Left 11/14/2020   Procedure: CYSTOSCOPY/URETEROSCOPY/HOLMIUM LASER/STENT PLACEMENT;  Surgeon: Devere Lonni Righter, MD;  Location: [facility];  Service: Urology;  Laterality: Left;  ONLY NEEDS 45 MIN   IR IMAGING GUIDED PORT INSERTION  01/22/2024  IR IVC FILTER PLMT / S&I /IMG GUID/MOD SED  08/24/2020   IR PARACENTESIS  01/22/2024    Social History:  reports that he quit smoking about 39 years ago. His smoking use included cigarettes. He has never been exposed to tobacco smoke. He has never used smokeless tobacco. He reports current alcohol use. He reports that he does not use drugs.  Allergies: Allergies[1]  Medications Prior to Admission  Medication Sig Dispense Refill   aluminum-magnesium  hydroxide 200-200 MG/5ML suspension  Take 10 mLs by mouth every 6 (six) hours as needed for indigestion.     dexamethasone  (DECADRON ) 4 MG tablet Take 1 tablet (4 mg total) by mouth daily. Take in morning 30 tablet 1   famotidine  (PEPCID ) 20 MG tablet Take 20 mg by mouth 2 (two) times daily.     lidocaine -prilocaine  (EMLA ) cream Apply 1 Application topically as directed. Apply 1/2 tablespoon to port site 1-2 hours prior to stick and cover with Press-and-Seal to numb port site. Do not start until 14 days of port placement. 30 g 3   LORazepam  (ATIVAN ) 0.5 MG tablet Place 1 tablet (0.5 mg total) under the tongue every 6 (six) hours as needed (nausea). 60 tablet 0   metoCLOPramide  (REGLAN ) 10 MG tablet Take 1 tablet (10 mg total) by mouth every 6 (six) hours. 120 tablet 0   omeprazole  (PRILOSEC) 40 MG capsule Take 1 capsule (40 mg total) by mouth 2 (two) times daily. 180 capsule 2   ondansetron  (ZOFRAN ) 8 MG tablet Take 1 tablet (8 mg total) by mouth every 8 (eight) hours as needed for nausea. May start 72 hours after chemotherapy treatment given due to receiving Aloxi  on day 1 30 tablet 1   scopolamine  (TRANSDERM-SCOP) 1 MG/3DAYS Place 1 patch (1 mg total) onto the skin every 3 (three) days as needed. Apply patch to skin and change every 72 hours as needed for nausea due to gastric cancer 10 patch 2   sucralfate  (CARAFATE ) 1 GM/10ML suspension Take 10 mLs (1 g total) by mouth 4 (four) times daily. 420 mL 4   prochlorperazine  (COMPAZINE ) 25 MG suppository Place 1 suppository (25 mg total) rectally every 12 (twelve) hours as needed for nausea or vomiting. (Patient not taking: No sig reported) 12 suppository 0     Physical Exam: Blood pressure (!) 138/94, pulse 71, temperature 98.2 F (36.8 C), resp. rate 17, height 6' (1.829 m), weight 70 kg, SpO2 95%. General: pleasant, male who is laying in bed in NAD HEENT: head is normocephalic, atraumatic.  Sclera are non-icteric.  Heart: regular, rate, and rhythm.   Lungs:  Respiratory effort  nonlabored Abd: Soft, ND, NT, +BS. No masses, hernias, or organomegaly MS: no BUE edema Skin: warm and dry  Psych: A&Ox4 with an appropriate affect Neuro: normal speech, thought process intact,  gait not assessed   Results for orders placed or performed during the hospital encounter of 11/27/24 (from the past 48 hours)  Lipase, blood     Status: Abnormal   Collection Time: 11/27/24  7:39 PM  Result Value Ref Range   Lipase 171 (H) 11 - 51 U/L    Comment: Performed at [facility], [address] [place], [place] [address]  Comprehensive metabolic panel     Status: Abnormal   Collection Time: 11/27/24  7:39 PM  Result Value Ref Range   Sodium 135 135 - 145 mmol/L   Potassium 4.0 3.5 - 5.1 mmol/L   Chloride 97 (L) 98 - 111 mmol/L  CO2 25 22 - 32 mmol/L   Glucose, Bld 122 (H) 70 - 99 mg/dL    Comment: Glucose reference range applies only to samples taken after fasting for at least 8 hours.   BUN 29 (H) 6 - 20 mg/dL   Creatinine, Ser 9.16 0.61 - 1.24 mg/dL   Calcium  10.0 8.9 - 10.3 mg/dL   Total Protein 7.7 6.5 - 8.1 g/dL   Albumin  4.0 3.5 - 5.0 g/dL   AST 25 15 - 41 U/L   ALT 62 (H) 0 - 44 U/L   Alkaline Phosphatase 101 38 - 126 U/L   Total Bilirubin 0.6 0.0 - 1.2 mg/dL   GFR, Estimated >39 >39 mL/min    Comment: (NOTE) Calculated using the CKD-EPI Creatinine Equation (2021)    Anion gap 14 5 - 15    Comment: Performed at [facility], [address] [place], [place] [address]  CBC     Status: None   Collection Time: 11/27/24  7:39 PM  Result Value Ref Range   WBC 7.1 4.0 - 10.5 K/uL   RBC 4.72 4.22 - 5.81 MIL/uL   Hemoglobin 14.5 13.0 - 17.0 g/dL   HCT 58.4 60.9 - 47.9 %   MCV 87.9 80.0 - 100.0 fL   MCH 30.7 26.0 - 34.0 pg   MCHC 34.9 30.0 - 36.0 g/dL   RDW 86.6 88.4 - 84.4 %   Platelets 274 150 - 400 K/uL   nRBC 0.0 0.0 - 0.2 %    Comment: Performed at [facility], [address], [place], [place]  [address]  Protime-INR     Status: None   Collection Time: 11/27/24  7:39 PM  Result Value Ref Range   Prothrombin Time 14.8 11.4 - 15.2 seconds   INR 1.1 0.8 - 1.2    Comment: (NOTE) INR goal varies based on device and disease states. Performed at [facility], [address], [place], [place] [address]   Troponin T, High Sensitivity     Status: None   Collection Time: 11/27/24  7:39 PM  Result Value Ref Range   Troponin T High Sensitivity <15 0 - 19 ng/L    Comment: (NOTE) Biotin concentrations > 1000 ng/mL falsely decrease TnT results.  Serial cardiac troponin measurements are suggested.  Refer to the Links section for chest pain algorithms and additional  guidance. Performed at [facility], [address], [place], [place] [address]   Hemoglobin A1c     Status: None   Collection Time: 11/27/24  7:39 PM  Result Value Ref Range   Hgb A1c MFr Bld 5.6 4.8 - 5.6 %    Comment: (NOTE) Diagnosis of Diabetes The following HbA1c ranges recommended by the American Diabetes Association (ADA) may be used as an aid in the diagnosis of diabetes mellitus.  Hemoglobin             Suggested A1C NGSP%              Diagnosis  <5.7                   Non Diabetic  5.7-6.4                Pre-Diabetic  >6.4                   Diabetic  <7.0                   Glycemic control  for                       adults with diabetes.     Mean Plasma Glucose 114.02 mg/dL    Comment: Performed at [facility], [address], [place], [place] [address]  CBC     Status: None   Collection Time: 11/28/24  3:36 PM  Result Value Ref Range   WBC 4.9 4.0 - 10.5 K/uL   RBC 4.38 4.22 - 5.81 MIL/uL   Hemoglobin 13.3 13.0 - 17.0 g/dL   HCT 60.3 60.9 - 47.9 %   MCV 90.4 80.0 - 100.0 fL   MCH 30.4 26.0 - 34.0 pg   MCHC 33.6 30.0 - 36.0 g/dL   RDW 86.2 88.4 - 84.4 %   Platelets 239 150 - 400 K/uL   nRBC 0.0 0.0 - 0.2 %    Comment: Performed at [facility], [address], [place], [place] [address]  Magnesium      Status: None   Collection Time: 11/28/24  3:36 PM  Result Value Ref Range   Magnesium  2.4 1.7 - 2.4 mg/dL    Comment: Performed at [facility], [address] [place], [place] [address]  Comprehensive metabolic panel     Status: Abnormal   Collection Time: 11/28/24  3:36 PM  Result Value Ref Range   Sodium 138 135 - 145 mmol/L   Potassium 3.8 3.5 - 5.1 mmol/L   Chloride 102 98 - 111 mmol/L   CO2 25 22 - 32 mmol/L   Glucose, Bld 119 (H) 70 - 99 mg/dL    Comment: Glucose reference range applies only to samples taken after fasting for at least 8 hours.   BUN 26 (H) 6 - 20 mg/dL   Creatinine, Ser 9.12 0.61 - 1.24 mg/dL   Calcium  9.2 8.9 - 10.3 mg/dL   Total Protein 6.8 6.5 - 8.1 g/dL   Albumin  3.6 3.5 - 5.0 g/dL   AST 21 15 - 41 U/L   ALT 51 (H) 0 - 44 U/L   Alkaline Phosphatase 82 38 - 126 U/L   Total Bilirubin 0.4 0.0 - 1.2 mg/dL   GFR, Estimated >39 >39 mL/min    Comment: (NOTE) Calculated using the CKD-EPI Creatinine Equation (2021)    Anion gap 11 5 - 15    Comment: Performed at [facility], [address] [place], [place] [address]  Hemoglobin A1c     Status: Abnormal   Collection Time: 11/28/24  3:36 PM  Result Value Ref Range   Hgb A1c MFr Bld 5.7 (H) 4.8 - 5.6 %    Comment: (NOTE) Diagnosis of Diabetes The following HbA1c ranges recommended by the American Diabetes Association (ADA) may be used as an aid in the diagnosis of diabetes mellitus.  Hemoglobin             Suggested A1C NGSP%              Diagnosis  <5.7                   Non Diabetic  5.7-6.4                Pre-Diabetic  >6.4                   Diabetic  <7.0  Glycemic control for                       adults with diabetes.     Mean Plasma Glucose 116.89 mg/dL    Comment: Performed at [facility], [address], [place], [place] [address]  CBC      Status: Abnormal   Collection Time: 11/29/24  2:50 AM  Result Value Ref Range   WBC 4.7 4.0 - 10.5 K/uL   RBC 4.24 4.22 - 5.81 MIL/uL   Hemoglobin 12.7 (L) 13.0 - 17.0 g/dL   HCT 61.5 (L) 60.9 - 47.9 %   MCV 90.6 80.0 - 100.0 fL   MCH 30.0 26.0 - 34.0 pg   MCHC 33.1 30.0 - 36.0 g/dL   RDW 86.1 88.4 - 84.4 %   Platelets 238 150 - 400 K/uL   nRBC 0.0 0.0 - 0.2 %    Comment: Performed at [facility], [address] [place], [place] [address]  Comprehensive metabolic panel     Status: Abnormal   Collection Time: 11/29/24  2:50 AM  Result Value Ref Range   Sodium 138 135 - 145 mmol/L   Potassium 3.7 3.5 - 5.1 mmol/L   Chloride 102 98 - 111 mmol/L   CO2 26 22 - 32 mmol/L   Glucose, Bld 138 (H) 70 - 99 mg/dL    Comment: Glucose reference range applies only to samples taken after fasting for at least 8 hours.   BUN 23 (H) 6 - 20 mg/dL   Creatinine, Ser 9.14 0.61 - 1.24 mg/dL   Calcium  9.0 8.9 - 10.3 mg/dL   Total Protein 6.6 6.5 - 8.1 g/dL   Albumin  3.4 (L) 3.5 - 5.0 g/dL   AST 18 15 - 41 U/L   ALT 45 (H) 0 - 44 U/L   Alkaline Phosphatase 77 38 - 126 U/L   Total Bilirubin 0.4 0.0 - 1.2 mg/dL   GFR, Estimated >39 >39 mL/min    Comment: (NOTE) Calculated using the CKD-EPI Creatinine Equation (2021)    Anion gap 10 5 - 15    Comment: Performed at [facility], [address] [place], [place] [address]   DG Abd 2 Views Result Date: 11/29/2024 EXAM: 2 VIEW XRAY OF THE ABDOMEN 11/29/2024 07:10:00 AM COMPARISON: None available. CLINICAL HISTORY: SBO (small bowel obstruction) (HCC) FINDINGS: LINES, TUBES AND DEVICES: IVC filter present. Partially visualized central venous catheter with tip overlying the superior cavoatrial junction. BOWEL: Enteric contrast material is again noted within the colon up to the level of the rectum. The appearance is unchanged compared with the previous exam without significant antegrade progression. Air-filled loops of  redundant sigmoid colon noted within the left upper quadrant of the abdomen, unchanged. Paucity of small bowel gas limits evaluation for obstruction. SOFT TISSUES: No abnormal calcifications. BONES: No acute fracture. IMPRESSION: 1. Paucity of small bowel gas limits evaluation for obstruction. 2. No significant change in the appearance of contrast material within the colon up to the rectum with air-filled loops of sigmoid colon in the left upper quadrant of the abdomen. Electronically signed by: Waddell Calk MD 11/29/2024 07:26 AM EST RP Workstation: [id number]   CT ABDOMEN PELVIS W CONTRAST Result Date: 11/27/2024 EXAM: CT ABDOMEN AND PELVIS WITH CONTRAST 11/27/2024 09:19:48 PM TECHNIQUE: CT of the abdomen and pelvis was performed with the administration of 100 mL of iohexol  (OMNIPAQUE ) 300 MG/ML solution. Multiplanar reformatted images are provided for review.  Automated exposure control, iterative reconstruction, and/or weight-based adjustment of the mA/kV was utilized to reduce the radiation dose to as low as reasonably achievable. COMPARISON: 11/02/2024 CLINICAL HISTORY: Abd pain, coffee ground emesis, elevated lipase, hx of gastric cancer FINDINGS: LOWER CHEST: Scarring with superimposed patchy peribronchovascular nodularity in the bilateral lower lobes, right greater than left (image 9), suggesting mild aspiration/pneumonia. LIVER: The liver is unremarkable. GALLBLADDER AND BILE DUCTS: Gallbladder is unremarkable. No biliary ductal dilatation. SPLEEN: No acute abnormality. PANCREAS: No acute abnormality. ADRENAL GLANDS: No acute abnormality. KIDNEYS, URETERS AND BLADDER: 5 mm nonobstructing right lower pole renal calculus. No stones in the ureters. No hydronephrosis. No perinephric or periureteral stranding. Urinary bladder is unremarkable. GI AND BOWEL: Fluid within the distal esophagus. Thick walled gastric body/antrum (image 17), chronic, without definite focal mass, although there is mild nodularity  along the pylorus (image 21). Dilated proximal small bowel (image 32), suggesting small bowel obstruction, although without focal transition point. PERITONEUM AND RETROPERITONEUM: Small volume pelvic ascites. No free air. VASCULATURE: Aorta is normal in caliber. IVC filter. LYMPH NODES: No lymphadenopathy. REPRODUCTIVE ORGANS: The prostate is unremarkable. BONES AND SOFT TISSUES: Mild degenerative changes of the visualized thoracolumbar spine. No acute osseous abnormality. No focal soft tissue abnormality. IMPRESSION: 1. Dilated proximal small bowel, suggesting small bowel obstruction, although without a discrete transition point. 2. Thick-walled gastric body/antrum in this patient with history of gastric cancer, without definite focal mass, with mild nodularity along the pylorus. 3. Scarring with superimposed patchy peribronchovascular nodularity in the bilateral lower lobes, right greater than left, suggesting mild aspiration/pneumonia. 4. Additional ancillary findings, as above. Electronically signed by: Pinkie Pebbles MD 11/27/2024 09:26 PM EST RP Workstation: [id number]   DG Chest Portable 1 View Result Date: 11/27/2024 EXAM: 1 VIEW(S) XRAY OF THE CHEST 11/27/2024 07:47:51 PM COMPARISON: 02/05/2024 CLINICAL HISTORY: coffee ground emesis, weakness FINDINGS: LINES, TUBES AND DEVICES: Right IJ port catheter in place. LUNGS AND PLEURA: Patchy airspace opacities in right mid lung. No pleural effusion. No pneumothorax. HEART AND MEDIASTINUM: No acute abnormality of the cardiac and mediastinal silhouettes. BONES AND SOFT TISSUES: Thoracic spondylosis. IMPRESSION: 1. Patchy airspace opacities in the right mid lung, which may reflect aspiration or infection. Electronically signed by: Greig Pique MD 11/27/2024 07:51 PM EST RP Workstation: [id number]    Anti-infectives (From admission, onward)    Start     Dose/Rate Route Frequency Ordered Stop   11/28/24 1200  Ampicillin -Sulbactam (UNASYN ) 3 g in sodium  chloride 0.9 % 100 mL IVPB        3 g 200 mL/hr over 30 Minutes Intravenous Every 6 hours 11/28/24 1048     11/28/24 0045  Ampicillin -Sulbactam (UNASYN ) 3 g in sodium chloride  0.9 % 100 mL IVPB        3 g 200 mL/hr over 30 Minutes Intravenous  Once 11/28/24 0042 11/28/24 0134       Assessment/Plan Intractable n/v with possible SBO Stage IV gastric cancer on cisplatin  - No indication for emergency surgery - GI planning EGD with +/- stent tomorrow. Recommend NGT placement (now vs at time of EGD) and SBO protocol to further evaluate.  - This was discussed with patient and family at bedside.  - We will follow with you  FEN - NPO, recommend NGT as above VTE - SCDs, okay for chem ppx from a general surgery standpoint ID - Unasyn  per primary   Aspiration PNA Prior DVT s/p IVC filter   I reviewed nursing notes, Consultant (GI, Oncology, Palliative) notes, last 66  h vitals and pain scores, last 48 h intake and output, last 24 h labs and trends, and last 24 h imaging results.   Henry May, [facility] Surgery 11/29/2024, 4:40 PM Please see Amion for pager number during day hours 7:00am-4:30pm     [1]  Allergies Allergen Reactions   Fluorouracil  Other (See Comments)    5-FU induced psychosis. See progress note from 02/11/24

## 2024-11-30 ENCOUNTER — Inpatient Hospital Stay (HOSPITAL_COMMUNITY)

## 2024-11-30 ENCOUNTER — Inpatient Hospital Stay

## 2024-11-30 ENCOUNTER — Other Ambulatory Visit (HOSPITAL_COMMUNITY): Payer: Self-pay

## 2024-11-30 ENCOUNTER — Inpatient Hospital Stay (HOSPITAL_COMMUNITY): Admitting: Anesthesiology

## 2024-11-30 ENCOUNTER — Inpatient Hospital Stay: Admitting: Nurse Practitioner

## 2024-11-30 ENCOUNTER — Encounter (HOSPITAL_COMMUNITY)
Admission: EM | Disposition: A | Discharge status: Home / Self Care | Payer: Self-pay | Source: Home / Self Care | Attending: Student

## 2024-11-30 ENCOUNTER — Encounter (HOSPITAL_COMMUNITY): Payer: Self-pay | Admitting: Internal Medicine

## 2024-11-30 DIAGNOSIS — K209 Esophagitis, unspecified without bleeding: Secondary | ICD-10-CM | POA: Diagnosis not present

## 2024-11-30 DIAGNOSIS — K2289 Other specified disease of esophagus: Secondary | ICD-10-CM

## 2024-11-30 DIAGNOSIS — K3189 Other diseases of stomach and duodenum: Secondary | ICD-10-CM

## 2024-11-30 DIAGNOSIS — K311 Adult hypertrophic pyloric stenosis: Secondary | ICD-10-CM

## 2024-11-30 DIAGNOSIS — I10 Essential (primary) hypertension: Secondary | ICD-10-CM

## 2024-11-30 DIAGNOSIS — E119 Type 2 diabetes mellitus without complications: Secondary | ICD-10-CM

## 2024-11-30 DIAGNOSIS — R188 Other ascites: Secondary | ICD-10-CM

## 2024-11-30 DIAGNOSIS — Z87891 Personal history of nicotine dependence: Secondary | ICD-10-CM | POA: Diagnosis not present

## 2024-11-30 DIAGNOSIS — K449 Diaphragmatic hernia without obstruction or gangrene: Secondary | ICD-10-CM

## 2024-11-30 DIAGNOSIS — E43 Unspecified severe protein-calorie malnutrition: Secondary | ICD-10-CM | POA: Insufficient documentation

## 2024-11-30 HISTORY — PX: ESOPHAGOGASTRODUODENOSCOPY: SHX5428

## 2024-11-30 HISTORY — PX: DUODENAL STENT PLACEMENT: SHX5541

## 2024-11-30 LAB — COMPREHENSIVE METABOLIC PANEL WITH GFR
ALT: 35 U/L (ref 0–44)
AST: 17 U/L (ref 15–41)
Albumin: 3.1 g/dL — ABNORMAL LOW (ref 3.5–5.0)
Alkaline Phosphatase: 77 U/L (ref 38–126)
Anion gap: 8 (ref 5–15)
BUN: 17 mg/dL (ref 6–20)
CO2: 28 mmol/L (ref 22–32)
Calcium: 8.8 mg/dL — ABNORMAL LOW (ref 8.9–10.3)
Chloride: 103 mmol/L (ref 98–111)
Creatinine, Ser: 0.73 mg/dL (ref 0.61–1.24)
GFR, Estimated: >60 mL/min (ref >60)
Glucose, Bld: 138 mg/dL — ABNORMAL HIGH (ref 70–99)
Potassium: 3.5 mmol/L (ref 3.5–5.1)
Sodium: 139 mmol/L (ref 135–145)
Total Bilirubin: 0.4 mg/dL (ref 0.0–1.2)
Total Protein: 6.1 g/dL — ABNORMAL LOW (ref 6.5–8.1)

## 2024-11-30 LAB — CBC
HCT: 35.8 % — ABNORMAL LOW (ref 39.0–52.0)
Hemoglobin: 11.7 g/dL — ABNORMAL LOW (ref 13.0–17.0)
MCH: 30.1 pg (ref 26.0–34.0)
MCHC: 32.7 g/dL (ref 30.0–36.0)
MCV: 92 fL (ref 80.0–100.0)
Platelets: 217 K/uL (ref 150–400)
RBC: 3.89 MIL/uL — ABNORMAL LOW (ref 4.22–5.81)
RDW: 13.8 % (ref 11.5–15.5)
WBC: 4.5 K/uL (ref 4.0–10.5)
nRBC: 0 % (ref 0.0–0.2)

## 2024-11-30 LAB — MAGNESIUM: Magnesium: 2 mg/dL (ref 1.7–2.4)

## 2024-11-30 LAB — LIPASE, BLOOD: Lipase: 53 U/L — ABNORMAL HIGH (ref 11–51)

## 2024-11-30 MED ORDER — POTASSIUM CHLORIDE 10 MEQ/100ML IV SOLN
10.0000 meq | INTRAVENOUS | Status: AC
  Filled 2024-11-30: qty 100

## 2024-11-30 MED ORDER — LIDOCAINE 2% (20 MG/ML) 5 ML SYRINGE
INTRAMUSCULAR | Status: DC | PRN
  Administered 2024-11-30: 16:00:00 50 mg via INTRAVENOUS

## 2024-11-30 MED ORDER — SUCCINYLCHOLINE 20MG/ML (10ML) SYRINGE FOR MEDFUSION PUMP - OPTIME
INTRAMUSCULAR | Status: DC | PRN
  Administered 2024-11-30: 16:00:00 120 mg via INTRAVENOUS

## 2024-11-30 MED ORDER — FENTANYL CITRATE (PF) 250 MCG/5ML IJ SOLN
INTRAMUSCULAR | Status: DC | PRN
  Administered 2024-11-30 (×2): 50 ug via INTRAVENOUS

## 2024-11-30 MED ORDER — PROPOFOL 500 MG/50ML IV EMUL
INTRAVENOUS | Status: AC
  Filled 2024-11-30: qty 50

## 2024-11-30 MED ORDER — PROPOFOL 10 MG/ML IV BOLUS
INTRAVENOUS | Status: DC | PRN
  Administered 2024-11-30: 16:00:00 30 mg via INTRAVENOUS
  Administered 2024-11-30: 16:00:00 110 mg via INTRAVENOUS

## 2024-11-30 MED ORDER — MIDAZOLAM HCL (PF) 2 MG/2ML IJ SOLN
INTRAMUSCULAR | Status: DC | PRN
  Administered 2024-11-30: 16:00:00 2 mg via INTRAVENOUS

## 2024-11-30 MED ORDER — MIDAZOLAM HCL 2 MG/2ML IJ SOLN
INTRAMUSCULAR | Status: AC
  Filled 2024-11-30: qty 2

## 2024-11-30 MED ORDER — SODIUM CHLORIDE 0.9 % IV SOLN: INTRAVENOUS | Status: DC | PRN

## 2024-11-30 MED ORDER — POTASSIUM CHLORIDE 10 MEQ/100ML IV SOLN
10.0000 meq | INTRAVENOUS | Status: AC
  Administered 2024-11-30 (×3): 10 meq via INTRAVENOUS
  Filled 2024-11-30 (×2): qty 100

## 2024-11-30 MED ORDER — FENTANYL CITRATE (PF) 100 MCG/2ML IJ SOLN
INTRAMUSCULAR | Status: AC
  Filled 2024-11-30: qty 2

## 2024-11-30 MED ORDER — ONDANSETRON HCL 4 MG/2ML IJ SOLN
INTRAMUSCULAR | Status: DC | PRN
  Administered 2024-11-30: 16:00:00 4 mg via INTRAVENOUS

## 2024-11-30 MED ORDER — EPHEDRINE SULFATE (PRESSORS) 25 MG/5ML IV SOSY: PREFILLED_SYRINGE | INTRAVENOUS | Status: DC | PRN

## 2024-11-30 MED ORDER — LACTATED RINGERS IV SOLN: INTRAVENOUS | Status: DC | PRN

## 2024-11-30 MED ORDER — DEXAMETHASONE SOD PHOSPHATE PF 10 MG/ML IJ SOLN
INTRAMUSCULAR | Status: DC | PRN
  Administered 2024-11-30: 16:00:00 10 mg via INTRAVENOUS

## 2024-11-30 NOTE — Progress Notes (Signed)
 Subjective: Patient still with vomiting overnight some.  Still appears to not feel well.  ROS: See above, otherwise other systems negative  Objective: Vital signs in last 24 hours: Temp:  [98.2 F (36.8 C)-99.1 F (37.3 C)] 99.1 F (37.3 C) (12/16 0454) Pulse Rate:  [71-78] 73 (12/16 0454) Resp:  [17-18] 17 (12/16 0454) BP: (136-142)/(84-94) 136/84 (12/16 0454) SpO2:  [94 %-95 %] 94 % (12/16 0454) Last BM Date : 12/03/24  Intake/Output from previous day: 12/15 0701 - 12/16 0700 In: 2409.8 [P.O.:25; I.V.:1931.6; IV Piggyback:453.1] Out: 300 [Emesis/NG output:300] Intake/Output this shift: No intake/output data recorded.  PE: Gen: appears to feel unwell Abd: soft, not really tender, ND   Lab Results:  Recent Labs    11/29/24 0250 11/30/24 0441  WBC 4.7 4.5  HGB 12.7* 11.7*  HCT 38.4* 35.8*  PLT 238 217   BMET Recent Labs    11/29/24 0250 11/30/24 0441  NA 138 139  K 3.7 3.5  CL 102 103  CO2 26 28  GLUCOSE 138* 138*  BUN 23* 17  CREATININE 0.85 0.73  CALCIUM  9.0 8.8*   PT/INR Recent Labs    11/27/24 1939  LABPROT 14.8  INR 1.1   CMP     Component Value Date/Time   NA 139 11/30/2024 0441   K 3.5 11/30/2024 0441   CL 103 11/30/2024 0441   CO2 28 11/30/2024 0441   GLUCOSE 138 (H) 11/30/2024 0441   BUN 17 11/30/2024 0441   CREATININE 0.73 11/30/2024 0441   CREATININE 0.83 11/26/2024 0917   CALCIUM  8.8 (L) 11/30/2024 0441   PROT 6.1 (L) 11/30/2024 0441   ALBUMIN  3.1 (L) 11/30/2024 0441   AST 17 11/30/2024 0441   AST 25 11/09/2024 0910   ALT 35 11/30/2024 0441   ALT 37 11/09/2024 0910   ALKPHOS 77 11/30/2024 0441   BILITOT 0.4 11/30/2024 0441   BILITOT 0.4 11/09/2024 0910   GFRNONAA >60 11/30/2024 0441   GFRNONAA >60 11/26/2024 0917   GFRAA >60 09/03/2020 0836   Lipase     Component Value Date/Time   LIPASE 53 (H) 11/30/2024 0441       Studies/Results: DG Abd 2 Views Result Date: 11/29/2024 EXAM: 2 VIEW XRAY OF THE  ABDOMEN 11/29/2024 07:10:00 AM COMPARISON: None available. CLINICAL HISTORY: SBO (small bowel obstruction) (HCC) FINDINGS: LINES, TUBES AND DEVICES: IVC filter present. Partially visualized central venous catheter with tip overlying the superior cavoatrial junction. BOWEL: Enteric contrast material is again noted within the colon up to the level of the rectum. The appearance is unchanged compared with the previous exam without significant antegrade progression. Air-filled loops of redundant sigmoid colon noted within the left upper quadrant of the abdomen, unchanged. Paucity of small bowel gas limits evaluation for obstruction. SOFT TISSUES: No abnormal calcifications. BONES: No acute fracture. IMPRESSION: 1. Paucity of small bowel gas limits evaluation for obstruction. 2. No significant change in the appearance of contrast material within the colon up to the rectum with air-filled loops of sigmoid colon in the left upper quadrant of the abdomen. Electronically signed by: Waddell Calk MD 11/29/2024 07:26 AM EST RP Workstation: [id number]    Anti-infectives: Anti-infectives (From admission, onward)    Start     Dose/Rate Route Frequency Ordered Stop   11/28/24 1200  Ampicillin -Sulbactam (UNASYN ) 3 g in sodium chloride  0.9 % 100 mL IVPB        3 g 200 mL/hr over 30 Minutes Intravenous Every 6 hours 11/28/24  1048     11/28/24 0045  Ampicillin -Sulbactam (UNASYN ) 3 g in sodium chloride  0.9 % 100 mL IVPB        3 g 200 mL/hr over 30 Minutes Intravenous  Once 11/28/24 0042 11/28/24 0134        Assessment/Plan Stage IV gastric cancer on cisplatin   Nausea/vomiting with possible psbo  -plan for EGD and stenting of stomach today.  Unclear if etiology originates here vs an area of psbo a bit more distal in his small bowel -could have NGT placed during EGD today for us  to proceed with SBO protocol following stent placement.   -no plans for surgical intervention at this time.  We will follow   FEN -  NPO/IVFs VTE - SCDs ID - unasyn  for aspiration PNA  I reviewed Consultant GI notes, hospitalist notes, last 24 h vitals and pain scores, last 48 h intake and output, last 24 h labs and trends, and last 24 h imaging results.   LOS: 2 days    Burnard FORBES Banter , [facility] Surgery 11/30/2024, 11:18 AM Please see Amion for pager number during day hours 7:00am-4:30pm or 7:00am -11:30am on weekends

## 2024-11-30 NOTE — Progress Notes (Signed)
 Palliative:  HPI: 58 y.o. male  with past medical history of gastric cancer (new chemotherapy 12/12), DVT with IVC filter admitted on 11/27/2024 with nausea and vomiting with coffee ground emesis. EGD 12/4 showed grade D esophagitis without bleeding and malignant infiltrative gastric tumor in the gastric body and in the gastric antrum with antral narrowing. CT abdomen/pelvis with contrast 12/13 showed dilated proximal small bowel suggesting SBO but without discrete transition point, thick walled gastric body/antrum without definite focal mass, mild nodularity along the pylorus. GI is following with plans for EGD +/- stent 12/16. Surgery also consulted and following.   Extensive chart review of notes, labs, MAR. Noted my colleagues previous conversations with Mr. Lefevers and family. I met today with Henry May along with daughter at bedside. I introduced myself. Mr. Hallas continues to be miserable with reflux and nausea although with less frequent vomiting episodes (but with larger volume). Some relief from haldol /Ativan  but with ongoing nausea. Daughter expresses that he has been becoming more weak with decreased intake with significant decline and weakness on Saturday. This is when they called to inquire about TPN options - this was not started. Daughter expresses that they have been counseled on the risks of TPN so they are hoping to avoid and exhaust all other options first but have not taken TPN off the table entirely. They are hopeful with some level of relief s/p EGD with hopes of successful stenting. Goals are clear to continue attempts to better manage symptoms with hopes of continuing cancer treatment.   We discussed NGT and how this works and they are open to this if recommended. I also discussed with them consideration of octreotide  and how this could add some benefit. At this time Mr. Henery is open to anything that may offer him relief. I shared that we will see how EGD goes and can consider  addition of octreotide  after this if still with symptoms. Discussed that this would be SQ injection.   All questions/concerns addressed. Emotional support provided. RN in room for part of conversation. Discussed with Dr. Gonfa and GI PA Jessica Zehr.   Exam: Alert, oriented. Generalized weakness and fatigue. No distress. Breathing regular, unlabored. Abd soft, non-tender. Moves all extremities.   Plan: - Full code, full scope - Await results from EGD this afternoon - Recommend consideration of octreotide  50 mcg subcutaneous TID   35 min  Bernarda Kitty, NP Palliative Medicine Team Pager [phone] (Please see [email or web address] for schedule) Team Phone [phone]

## 2024-11-30 NOTE — Op Note (Signed)
 [facility] Patient Name: Mervin Ramires Procedure Date: 11/30/2024 MRN: [id number] Attending MD: Aloha Finner , MD, [id number] Date of Birth: 22-Apr-1966 CSN: [id number] Age: 58 Admit Type: Inpatient Procedure:                Upper GI endoscopy Indications:              Abnormal CT of the GI tract, Nausea with vomiting Providers:                Aloha Finner, MD, Ozell Pouch, Fairy Marina, Technician Referring MD:              Medicines:                General Anesthesia Complications:            No immediate complications. Estimated Blood Loss:     Estimated blood loss was minimal. Procedure:                Pre-Anesthesia Assessment:                           - Prior to the procedure, a History and Physical                            was performed, and patient medications and                            allergies were reviewed. The patient's tolerance of                            previous anesthesia was also reviewed. The risks                            and benefits of the procedure and the sedation                            options and risks were discussed with the patient.                            All questions were answered, and informed consent                            was obtained. Prior Anticoagulants: The patient has                            taken no anticoagulant or antiplatelet agents. ASA                            Grade Assessment: III - A patient with severe                            systemic disease. After reviewing the risks and  benefits, the patient was deemed in satisfactory                            condition to undergo the procedure.                           After obtaining informed consent, the endoscope was                            passed under direct vision. Throughout the                            procedure, the patient's blood pressure, pulse, and                             oxygen  saturations were monitored continuously. The                            [id number] ([id number]) Olympus endoscope was                            introduced through the mouth, and advanced to the                            proximal jejunum. The upper GI endoscopy was                            accomplished without difficulty. The patient                            tolerated the procedure. Scope In: Scope Out: Findings:      LA Grade D (one or more mucosal breaks involving at least 75% of       esophageal circumference) esophagitis was found in the entire esophagus.      The Z-line was irregular.      Diffuse severe mucosal changes characterized by granularity,       inflammation and altered texture were found in the entire examined       stomach.      Retained fluid was found in the entire examined stomach. Suction via       Endoscope was performed for total of 300 cc.      A malignant-appearing, intrinsic severe stenosis was found in the       gastric antrum. This was traversed. Placement of a 0.035 inch x 450 cm       straight Hydra Jagwire was attempted. This passed successfully. This was       stented with a 22 mm x 6 cm WallFlex stent under fluoroscopic guidance.      A moderate dilation deformity was found in the examined duodenum.      Retained fluid was found in the entire examined duodenum. Suction via       Endoscope was performed for total of 400 cc.      A 16 Fr nasogastric tube was placed through the nares into the       esophagus. Under endoscopic guidance, the tube was advanced into the  stomach. Placement was confirmed by fluoroscopy and scope visualization. Impression:               - LA Grade D esophagitis throughout.                           - Z-line irregular.                           - Granular, inflamed and texture changed mucosa in                            the stomach - linnitus pattern.                           - Retained gastric  fluid - suctioned via endoscope.                           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 Prosthesis placed.                           - Duodenal dilation deformity throughout.                           - Retained food in the duodenum.                           - NGT decompression tube placement was successfully                            performed. Moderate Sedation:      Not Applicable - Patient had care per Anesthesia. Recommendation:           - The patient will be observed post-procedure,                            until all discharge criteria are met.                           - Return patient to hospital ward for ongoing care.                           - Observe patient's clinical course.                           - CLD at this time.                           - Observe patient's clinical course.                           - ADAT based on GI evaluation in coming day. CLD x  48 hours then FLD x 24-48 hours then low residue                            can be considered.                           - If issues persist, then SB protocol to evaluate                            for other areas more distal in the jejunum that                            could be kinked/obstructed will need to be                            considered.                           - KUB per medicine team, should it need to be                            placed for LIWS (for now can clamp).                           - The findings and recommendations were discussed                            with the patient.                           - The findings and recommendations were discussed                            with the referring physician. Procedure Code(s):        --- Professional ---                           [phone], Esophagogastroduodenoscopy, flexible,                            transoral; with placement of endoscopic  stent                            (includes pre- and post-dilation and guide wire                            passage, when performed)                           74360, 26, Intraluminal dilation of strictures                            and/or obstructions (eg, esophagus), radiological  supervision and interpretation Diagnosis Code(s):        --- Professional ---                           K20.90, Esophagitis, unspecified without bleeding                           K22.89, Other specified disease of esophagus                           K29.70, Gastritis, unspecified, without bleeding                           K31.89, Other diseases of stomach and duodenum                           R11.2, Nausea with vomiting, unspecified                           R93.3, Abnormal findings on diagnostic imaging of                            other parts of digestive tract CPT copyright 2022 [employer]. All rights reserved. The codes documented in this report are preliminary and upon coder review may  be revised to meet current compliance requirements. Aloha Finner, MD 11/30/2024 4:53:59 PM Number of Addenda: 0

## 2024-11-30 NOTE — Transfer of Care (Signed)
 Immediate Anesthesia Transfer of Care Note  Patient: Henry May  Procedure(s) Performed: EGD (ESOPHAGOGASTRODUODENOSCOPY) INSERTION, STENT, DUODENUM  Patient Location: Endoscopy Unit  Anesthesia Type:General  Level of Consciousness: drowsy and patient cooperative  Airway & Oxygen  Therapy: Patient Spontanous Breathing  Post-op Assessment: Report given to RN and Post -op Vital signs reviewed and stable  Post vital signs: Reviewed and stable  Last Vitals:  Vitals Value Taken Time  BP 141/81 11/30/24 16:50  Temp 36.6 C 11/30/24 16:49  Pulse 76 11/30/24 16:52  Resp 25 11/30/24 16:52  SpO2 94 % 11/30/24 16:52  Vitals shown include unfiled device data.  Last Pain:  Vitals:   11/30/24 1649  TempSrc: Temporal  PainSc:          Complications: No notable events documented.

## 2024-11-30 NOTE — Progress Notes (Signed)
 Initial Nutrition Assessment  DOCUMENTATION CODES:   Severe malnutrition in context of chronic illness  INTERVENTION:  - If unable to advance diet and/or if patient not able to tolerate a diet in the next 1-2 days, strongly recommend initiation of TPN.    NUTRITION DIAGNOSIS:   Severe Malnutrition related to chronic illness (gastric cancer) as evidenced by severe fat depletion, severe muscle depletion, percent weight loss (24% weight loss in less than 3 months).  GOAL:   Patient will meet greater than or equal to 90% of their needs  MONITOR:   Diet advancement, Labs, Weight trends  REASON FOR ASSESSMENT:   Malnutrition Screening Tool, NPO/Clear Liquid Diet (NPO x3 days)    ASSESSMENT:   58 y.o. male with PMH of gastric cancer on chemo and recent esophagitis who presented due to nausea, vomiting and coffee-ground emesis 2 days after he started new chemotherapy. Admitted for intractable nausea and vomiting, dilated small bowel loops with concern for partial SBO, and coffee-ground emesis.   12/13 Admit; NPO  Patient in bed at time of visit. Daughter at bedside and provided most nutrition history.   UBW reported to be 240# initially. Daughter reports the patient lost weight from January to March but was then doing better and able to gain back up until September. Unfortunately, patient took a turn after that time and has since been losing weight drastically.   EMR confirms weight trends. Patient weighed at 222# in January and dropped to 176# by March. He was then able to slowly gain back to 203# by September. However, since September his weight has been decreasing and patient weighed this admission at 154#. This is a 49# or 24% weight loss in <3 months, which is significant and severe for the time frame.   Daughter reports the patient was doing fairly well until November. Since that time, he has gradually been eating less and less. Intake decreased to just liquids and then to just a  few spoon fulls of liquids. He has reportedly been having nausea and vomiting for the past 1 month as well, so anything he got down would often come back up. Daughter reports the patient has not had anything to eat in at least a week.   Per last oncology note from 12/12, patient and family interested in home TPN. Daughter confirms this and states they had been waiting to hear if it would get covered bu the patient was doing so poorly they had to come to the hospital.  Patient follows with the outpatient cancer center dietitians.   Patient has been NPO since admit 3 days ago. He is scheduled to have an EGD and stent placement today. Possible plan to also place NGT to have SBO protocol started. Plan to await results of EGD to decide further plan of care. If unable to advance diet and/or if patient not tolerating a diet in the next day or so, recommend TPN.      Medications reviewed and include: Decadron  BID, protonix , Phenergan  Q6H  Labs reviewed:  -   NUTRITION - FOCUSED PHYSICAL EXAM:  Flowsheet Row Most Recent Value  Orbital Region Moderate depletion  Upper Arm Region Severe depletion  Thoracic and Lumbar Region Severe depletion  Buccal Region Moderate depletion  Temple Region Moderate depletion  Clavicle Bone Region Severe depletion  Clavicle and Acromion Bone Region Severe depletion  Scapular Bone Region Unable to assess  Dorsal Hand Mild depletion  Patellar Region Severe depletion  Anterior Thigh Region Severe depletion  Posterior  Calf Region Severe depletion  Edema (RD Assessment) None  Hair Reviewed  Eyes Reviewed  Mouth Reviewed  Skin Reviewed  Nails Reviewed    Diet Order:   Diet Order             Diet NPO time specified  Diet effective midnight                   EDUCATION NEEDS:  Education needs have been addressed  Skin:  Skin Assessment: Reviewed RN Assessment  Last BM:  PTA  Height:  Ht Readings from Last 1 Encounters:  11/29/24 6' (1.829 m)    Weight:  Wt Readings from Last 1 Encounters:  11/29/24 70 kg   Ideal Body Weight:  80.91 kg  BMI:  Body mass index is 20.94 kg/m.  Estimated Nutritional Needs:  Kcal:  2100-2450 kcals Protein:  105-120 grams Fluid:  >/= 2.1L    Trude Ned RD, LDN Contact via Secure Chat.

## 2024-11-30 NOTE — Interval H&P Note (Signed)
 History and Physical Interval Note:  11/30/2024 2:32 PM  Henry May  has presented today for surgery, with the diagnosis of ? bowel obstruction, gastric malignancy.  The various methods of treatment have been discussed with the patient and family. After consideration of risks, benefits and other options for treatment, the patient has consented to  Procedures with comments: EGD (ESOPHAGOGASTRODUODENOSCOPY) (N/A) - with possible enteral stent INSERTION, STENT, DUODENUM (N/A) as a surgical intervention.  The patient's history has been reviewed, patient examined, no change in status, stable for surgery.  I have reviewed the patient's chart and labs.  Questions were answered to the patient's satisfaction.    Will evaluate for adequacy of candidacy for enteral stenting.  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  Time will tell based on findings.  Increased risk of perforation noted/discussed with patient when performing enteral stenting.   Tenishia Ekman Mansouraty Jr

## 2024-11-30 NOTE — Anesthesia Preprocedure Evaluation (Signed)
 Anesthesia Evaluation  Patient identified by MRN, date of birth, ID band Patient awake    Reviewed: Allergy & Precautions, H&P , NPO status , Patient's Chart, lab work & pertinent test results  Airway Mallampati: II   Neck ROM: full    Dental   Pulmonary former smoker   breath sounds clear to auscultation       Cardiovascular hypertension,  Rhythm:regular Rate:Normal     Neuro/Psych    GI/Hepatic ,GERD  ,,  Endo/Other  diabetes, Type 2    Renal/GU      Musculoskeletal   Abdominal   Peds  Hematology   Anesthesia Other Findings   Reproductive/Obstetrics                              Anesthesia Physical Anesthesia Plan  ASA: 3  Anesthesia Plan: MAC   Post-op Pain Management:    Induction: Intravenous  PONV Risk Score and Plan: 1 and Propofol  infusion and Treatment may vary due to age or medical condition  Airway Management Planned: Nasal Cannula  Additional Equipment:   Intra-op Plan:   Post-operative Plan:   Informed Consent: I have reviewed the patients History and Physical, chart, labs and discussed the procedure including the risks, benefits and alternatives for the proposed anesthesia with the patient or authorized representative who has indicated his/her understanding and acceptance.     Dental advisory given  Plan Discussed with: CRNA, Anesthesiologist and Surgeon  Anesthesia Plan Comments:         Anesthesia Quick Evaluation

## 2024-11-30 NOTE — Progress Notes (Signed)
 PROGRESS NOTE  Henry May FMW:[id number] DOB: 13-Sep-1966   PCP: [name], DO  Patient is from: Home.  Lives with wife and daughter.  DOA: 11/27/2024 LOS: 2  Chief complaints No chief complaint on file.    Brief Narrative / Interim history: 58 year old M with PMH of DVT with IVC filter, gastric cancer on chemo and esophagitis presented to [facility] due to nausea, vomiting and coffee-ground emesis 2 days after he started new chemotherapy, and admitted for intractable nausea and vomiting, dilated small bowel loops and coffee-ground emesis.  Recent EGD on 12/4 with LA grade D esophagitis without bleeding, and large infiltrative, sessile and ulcerated circumferential mass with contact of bleeding in gastric body and antrum with antral narrowing and luminal diameter of approximately 1.5 cm  In ED, stable vitals.  CBC and CMP without significant finding.  Lipase 171.  CT abdomen and pelvis showed dilated proximal small bowel suggesting SBO but without discrete transition point, thick walled gastric body/antrum without definite focal mass, mild nodularity along the pylorus, scarring with superimposed patchy peribronchovascular nodularity in the bilateral lower lobes right more than left suggesting mild aspiration or pneumonia.  Patient was started on antiemetics, IV fluid, PPI and admitted for further care.  Oncology, surgery, GI and palliative medicine consulted.  Plan for EGD and possible stenting.    Subjective: Seen and examined earlier this morning.  No major events overnight or this morning.  Reports improvement in frequency of emesis but larger volume.  Able to get some sleep last night.  Patient's daughter and wife at bedside.  Assessment and plan: Intractable nausea and vomiting: Started 2 days after chemo for his gastric cancer.  Could be due to chemo or malignancy.  CT with dilated small bowel without transition point.  Recent EGD as above. - GI planning EGD and possible  stent today. - Remains NPO.  Continue IV fluid. - Continue IV Protonix  every 6 hours and decreased to every 12 hours after 72 hours - Continue scheduled Phenergan  with as needed Ativan . - Continue Decadron  per palliative.  Coffee ground emesis: Likely from ulcerated gastric mass.  Also history of esophagitis.  Slight drop in hemoglobin likely dilutional. -Continue PPI as above -GI on board.   Dilated small bowel without transition point: KUB with contrast in colon.  Partial SBO? -Defer to GI and surgery.  Gastric cancer: Recently restarted on chemo.  Followed by Dr. Zorita.  -Oncology on board. -Palliative medicine on board.  Possible aspiration pneumonitis/pneumonia: No respiratory symptoms.  CXR showed patchy airspace opacities in RML.  CT abdomen and pelvis with bibasilar infiltrate. -Continue IV Unasyn    Diet controlled diabetes: A1c 5.7%.  Not on medication at home. 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  Improved.  Insomnia - Add IV Ativan  as above.  Body mass index is 20.94 kg/m.           DVT prophylaxis:  SCDs Start: 11/28/24 1042  Code Status: Full code Family Communication: Updated patient's wife and daughter at bedside Level of care: Progressive Status is: Inpatient Remains inpatient appropriate because: Intractable nausea and vomiting   Final disposition: Likely home once medically stable   55 minutes with more than 50% spent in reviewing records, counseling patient/family and coordinating care.  Consultants:  Gastroenterology Oncology Palliative medicine  Procedures: None  Microbiology summarized: None  Objective: Vitals:   11/29/24 0511 11/29/24 1353 11/29/24 2005 11/30/24  0454  BP:  (!) 138/94 (!) 142/86 136/84  Pulse:  71 78 73  Resp:   18 17  Temp:  98.2 F (36.8 C) 98.8 F (37.1 C) 99.1 F (37.3 C)  TempSrc:   Oral Oral  SpO2:   95% 95% 94%  Weight: 70 kg     Height: 6' (1.829 m)       Examination:  GENERAL: No apparent distress.  Nontoxic. HEENT: MMM.  Vision and hearing grossly intact.  NECK: Supple.  No apparent JVD.  RESP:  No IWOB.  Fair aeration bilaterally. CVS:  RRR. Heart sounds normal.  ABD/GI/GU: BS+. Abd soft, NTND.  MSK/EXT:  Moves extremities. No apparent deformity. No edema.  SKIN: no apparent skin lesion or wound NEURO: AA.  Oriented appropriately.  No apparent focal neuro deficit. PSYCH: Calm. Normal affect.   Sch Meds:  Scheduled Meds:  Chlorhexidine  Gluconate Cloth  6 each Topical Daily   dexamethasone  (DECADRON ) injection  2 mg Intravenous Q12H   pantoprazole  (PROTONIX ) IV  40 mg Intravenous Q6H   Followed by   NOREEN ON 12/01/2024] pantoprazole  (PROTONIX ) IV  40 mg Intravenous Q12H   Continuous Infusions:  sodium chloride  10 mL/hr at 11/30/24 0400   ampicillin -sulbactam (UNASYN ) IV 3 g (11/30/24 1135)   dextrose  5% lactated ringers  125 mL/hr at 11/30/24 0700   potassium chloride      promethazine  (PHENERGAN ) injection (IM or IVPB) 12.5 mg (11/30/24 0526)   PRN Meds:.acetaminophen  **OR** acetaminophen , hydrALAZINE , LORazepam , sodium chloride  flush  Antimicrobials: Anti-infectives (From admission, onward)    Start     Dose/Rate Route Frequency Ordered Stop   11/28/24 1200  Ampicillin -Sulbactam (UNASYN ) 3 g in sodium chloride  0.9 % 100 mL IVPB        3 g 200 mL/hr over 30 Minutes Intravenous Every 6 hours 11/28/24 1048     11/28/24 0045  Ampicillin -Sulbactam (UNASYN ) 3 g in sodium chloride  0.9 % 100 mL IVPB        3 g 200 mL/hr over 30 Minutes Intravenous  Once 11/28/24 0042 11/28/24 0134        I have personally reviewed the following labs and images: CBC: Recent Labs  Lab 11/24/24 1330 11/27/24 1939 11/28/24 1536 11/29/24 0250 11/30/24 0441  WBC 6.2 7.1 4.9 4.7 4.5  NEUTROABS 4.2  --   --   --   --   HGB 14.6 14.5 13.3 12.7* 11.7*  HCT 42.5 41.5 39.6 38.4*  35.8*  MCV 86.9 87.9 90.4 90.6 92.0  PLT 254 274 239 238 217   BMP &GFR Recent Labs  Lab 11/24/24 1330 11/26/24 0917 11/27/24 1939 11/28/24 1536 11/29/24 0250 11/30/24 0441  NA 139 138 135 138 138 139  K 3.8 3.7 4.0 3.8 3.7 3.5  CL 99 99 97* 102 102 103  CO2 27 26 25 25 26 28   GLUCOSE 113* 105* 122* 119* 138* 138*  BUN 26* 24* 29* 26* 23* 17  CREATININE 0.90 0.83 0.83 0.87 0.85 0.73  CALCIUM  9.7 10.1 10.0 9.2 9.0 8.8*  MG 2.4  --   --  2.4  --  2.0   Estimated Creatinine Clearance: 97.2 mL/min (by C-G formula based on SCr of 0.73 mg/dL). Liver & Pancreas: Recent Labs  Lab 11/27/24 1939 11/28/24 1536 11/29/24 0250 11/30/24 0441  AST 25 21 18 17   ALT 62* 51* 45* 35  ALKPHOS 101 82 77 77  BILITOT 0.6 0.4 0.4 0.4  PROT 7.7 6.8 6.6 6.1*  ALBUMIN  4.0 3.6 3.4*  3.1*   Recent Labs  Lab 11/27/24 1939 11/30/24 0441  LIPASE 171* 53*   No results for input(s): AMMONIA in the last 168 hours. Diabetic: Recent Labs    11/27/24 1939 11/28/24 1536  HGBA1C 5.6 5.7*   No results for input(s): GLUCAP in the last 168 hours. Cardiac Enzymes: No results for input(s): CKTOTAL, CKMB, CKMBINDEX, TROPONINI in the last 168 hours. No results for input(s): PROBNP in the last 8760 hours. Coagulation Profile: Recent Labs  Lab 11/27/24 1939  INR 1.1   Thyroid  Function Tests: No results for input(s): TSH, T4TOTAL, FREET4, T3FREE, THYROIDAB in the last 72 hours. Lipid Profile: No results for input(s): CHOL, HDL, LDLCALC, TRIG, CHOLHDL, LDLDIRECT in the last 72 hours. Anemia Panel: No results for input(s): VITAMINB12, FOLATE, FERRITIN, TIBC, IRON, RETICCTPCT in the last 72 hours. Urine analysis:    Component Value Date/Time   COLORURINE YELLOW 11/04/2024 1812   APPEARANCEUR CLEAR 11/04/2024 1812   LABSPEC 1.025 11/04/2024 1812   PHURINE 5.5 11/04/2024 1812   GLUCOSEU NEGATIVE 11/04/2024 1812   HGBUR NEGATIVE 11/04/2024 1812    BILIRUBINUR NEGATIVE 11/04/2024 1812   KETONESUR 15 (A) 11/04/2024 1812   PROTEINUR TRACE (A) 11/04/2024 1812   NITRITE NEGATIVE 11/04/2024 1812   LEUKOCYTESUR NEGATIVE 11/04/2024 1812   Sepsis Labs: Invalid input(s): PROCALCITONIN, LACTICIDVEN  Microbiology: No results found for this or any previous visit (from the past 240 hours).  Radiology Studies: No results found.     Alfredo Spong T. [place]  If 7PM-7AM, please contact night-coverage [email or web address] 11/30/2024, 11:44 AM

## 2024-11-30 NOTE — Progress Notes (Signed)
 IP PROGRESS NOTE  Subjective:   Henry May is well-known to me with a history of metastatic gastric cancer.  He has follow-up intractable nausea/vomiting and reflux symptoms over the past month.  He has been evaluated by Dr. Charlanne including an upper endoscopy which confirmed tumor in the stomach and antral narrowing.  He completed cycle 1 of salvage cisplatin  chemotherapy 11/26/2024.  He presented to the emergency room with intractable nausea and vomiting on 01/29/2024. He reports continued vomiting every few hours.  Lorazepam  helps the nausea.  Decadron  has not helped in the past.  His family reports Haldol  has helped. He is scheduled for an upper endoscopy today. Objective: Vital signs in last 24 hours: Blood pressure 136/84, pulse 73, temperature 99.1 F (37.3 C), temperature source Oral, resp. rate 17, height 6' (1.829 m), weight 154 lb 6.4 oz (70 kg), SpO2 94%.  Intake/Output from previous day: 12/15 0701 - 12/16 0700 In: 2409.8 [P.O.:25; I.V.:1931.6; IV Piggyback:453.1] Out: 300 [Emesis/NG output:300]  Physical Exam:  HEENT: No thrush Lungs: Clear bilaterally Cardiac: Regular rate and rhythm Abdomen: Nontender, no hepatomegaly Extremities: No leg edema   Portacath/PICC-without erythema  Lab Results: Recent Labs    11/29/24 0250 11/30/24 0441  WBC 4.7 4.5  HGB 12.7* 11.7*  HCT 38.4* 35.8*  PLT 238 217    BMET Recent Labs    11/29/24 0250 11/30/24 0441  NA 138 139  K 3.7 3.5  CL 102 103  CO2 26 28  GLUCOSE 138* 138*  BUN 23* 17  CREATININE 0.85 0.73  CALCIUM  9.0 8.8*    Lab Results  Component Value Date   CEA 3.57 01/15/2024    Studies/Results: DG Abd 2 Views Result Date: 11/29/2024 EXAM: 2 VIEW XRAY OF THE ABDOMEN 11/29/2024 07:10:00 AM COMPARISON: None available. CLINICAL HISTORY: SBO (small bowel obstruction) (HCC) FINDINGS: LINES, TUBES AND DEVICES: IVC filter present. Partially visualized central venous catheter with tip overlying the superior  cavoatrial junction. BOWEL: Enteric contrast material is again noted within the colon up to the level of the rectum. The appearance is unchanged compared with the previous exam without significant antegrade progression. Air-filled loops of redundant sigmoid colon noted within the left upper quadrant of the abdomen, unchanged. Paucity of small bowel gas limits evaluation for obstruction. SOFT TISSUES: No abnormal calcifications. BONES: No acute fracture. IMPRESSION: 1. Paucity of small bowel gas limits evaluation for obstruction. 2. No significant change in the appearance of contrast material within the colon up to the rectum with air-filled loops of sigmoid colon in the left upper quadrant of the abdomen. Electronically signed by: Waddell Calk MD 11/29/2024 07:26 AM EST RP Workstation: [id number]    Medications: I have reviewed the patient's current medications.  Assessment/Plan: Gastric cancer 11/21/2023 EGD-gastric body with infiltrative looking lesion; biopsy shows involvement of a hypercellular lesion that suggests diffuse carcinoma by morphology CTs abdomen/pelvis 11/28/2023-diffuse fatty infiltration of the liver; gastric wall thickening; lymph nodes up to 6 mm in diameter near the stomach wall. 12/31/2023 upper endoscopy-large diffuse friable infiltrative polypoid and ulcerated circumferential mass found in the gastric body.  Scope was passed beyond the mass with normal antrum/pylorus.  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  Negative for HER2 (1+); mismatch repair protein IHC normal; PD-L1 CPS 0%; CLDN18 positive: 90% of tumor cells with 2+/3+ membrane staining PET scan 01/07/2024-circumferential hypermetabolic gastric mucosal thickening.  Ill-defined hypermetabolic lymph nodes in the gastrohepatic ligament.  Intense hypermetabolic  activity associated with the right lobe of the thyroid  gland.  Small hypermetabolic  right axillary node favored reactive. 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  Gastric wall thickening slightly improved. Cycle 7 oxaliplatin , zolbetuximab 05/05/2024, Fulphila  Cycle 8 zolbetuximab 05/20/2024, oxaliplatin  held due to neuropathy, no Fulphila  Cycle 9 oxaliplatin /zolbetuximab 06/03/2024, Fulphila  Cycle 10 oxaliplatin /zolbetuximab 06/17/2024, Fulphila  Cycle 11 oxaliplatin /zolbetuximab 06/30/2024, Fulphila , oxaliplatin  dose reduced Cycle 12 zolbetuximab 07/15/2024, oxaliplatin  held secondary to neuropathy Cycle 13 zolbetuximab 07/29/2024, oxaliplatin  held secondary to neuropathy Cycle 14 zolbetuximab 08/26/2024, oxaliplatin  held secondary to neuropathy 09/01/2024 CTs-mild distal gastric wall thickening without discrete mass.  No metastatic disease. Cycle 15 zolbetuximab 09/09/2024, oxaliplatin  held due to neuropathy Cycle 16 zolbetuximab 09/30/2024, oxaliplatin  held due to neuropathy Cycle 17 zolbetuximab 10/21/2024, oxaliplatin  held due to neuropathy 11/03/2024 CT abdomen/pelvis: Diffuse mild wall thickening of the distal gastric body and antrum-stable, mild increase in a right gastric lymph node small volume ascites without omental nodularity, proximal jejunal mural thickening 11/04/2024 upper GI: Narrowing of the gastric antrum, normal gastric emptying, dilated duodenum and proximal jejunum without  evidence of a bowel obstruction 11/18/2024 upper endoscopy: Tumor in the gastric body and antrum with antral narrowing, dilated duodenum without evidence of mass or obstruction, esophagitis: Biopsy of the gastric mass-poorly differentiated adenocarcinoma with focal signet ring morphology, esophagus biopsy: Squamous mucosa with focal ulceration Cycle 1 weekly cisplatin  11/26/2024 Early satiety, postprandial abdominal pain, weight loss secondary to #1 History of bilateral lower extremity DVT 2021-anticoagulation discontinued due to massive retroperitoneal bleed, IVC filter placed 08/24/2020 Hospitalized with acute respiratory failure due to COVID-19 08/06/2020 - 09/04/2020 History of massive retroperitoneal bleed secondary to anticoagulation September 2021 Thyroid  ultrasound 01/13/2024-no abnormal nodule identified in the right lobe.  1.1 cm thyroid  isthmus nodule meets criteria for 1 year follow-up ultrasound. Admission 02/01/2024 with increased abdominal pain/ascites Hospitalization with altered mental status 02/05/2024 through 02/13/2024; question rare case of 5-FU psychosis.  Improved 02/16/2024.  Mental status at baseline 02/23/2024. Neutropenia 02/16/2024 Mucositis 02/16/2024.  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  Persistent cold sensitivity 05/20/2024, oxaliplatin  held.  Cold sensitivity resolved 06/03/2024, oxaliplatin  resumed.  Mild loss of vibratory sense, oxaliplatin  dose reduced 06/29/2024 Admission 11/28/2024 with intractable nausea and vomiting 11/27/2024 CT Abdo/pelvis: Bilateral lower lobe aspiration/pneumonia, thick-walled gastric body/antrum, dilated proximal small bowel, small volume pelvic ascites   Henry May has metastatic gastric cancer.  There is been recent clinical evidence of disease progression with  persistent/progressive tumor in the stomach and intractable nausea/vomiting.  The omental disease on presentation last year remains significantly improved.  He responded well to initial treatment with oxaliplatin  based chemotherapy.  He has neuropathy symptoms.  He began a trial of salvage therapy with cisplatin  last week.  He is now admitted with intractable nausea/vomiting, potentially related to a small bowel obstruction in addition to persistent tumor in the stomach.  I reviewed the admission CT images.  It is possible the nausea is in part due to the low-dose cisplatin , but I think this is less likely.  We have seen  him multiple times over the past month with nausea and vomiting.  He is scheduled for an upper endoscopy and possible stent placement today.  He has been evaluated by the surgical service.  Henry May and his family indicate he wishes to continue treatment of the cancer.  He will remain on a full CODE STATUS.  Recommendations: Continue antibiotics for the suspected aspiration pneumonia Upper endoscopy and possible stent placement today Ativan  as needed for nausea Plan 4-week #2 cisplatin  12/02/2024 or 12/03/2024, we can consider giving as an inpatient if he is not discharged over the next few days   LOS: 2 days   Arley Hof, MD   11/30/2024, 12:44 PM

## 2024-11-30 NOTE — Anesthesia Procedure Notes (Signed)
 Procedure Name: Intubation Date/Time: 11/30/2024 3:57 PM  Performed by: Nanci Riis, CRNAPre-anesthesia Checklist: Patient identified, Emergency Drugs available, Suction available, Patient being monitored and Timeout performed Patient Re-evaluated:Patient Re-evaluated prior to induction Oxygen  Delivery Method: Circle system utilized Preoxygenation: Pre-oxygenation with 100% oxygen  Induction Type: IV induction, Rapid sequence and Cricoid Pressure applied Laryngoscope Size: Miller and 3 Grade View: Grade I Tube type: Oral Tube size: 7.5 mm Number of attempts: 1 Airway Equipment and Method: Stylet Placement Confirmation: ETT inserted through vocal cords under direct vision, positive ETCO2 and breath sounds checked- equal and bilateral Secured at: 22 cm Dental Injury: Teeth and Oropharynx as per pre-operative assessment

## 2024-12-01 ENCOUNTER — Inpatient Hospital Stay (HOSPITAL_COMMUNITY)

## 2024-12-01 ENCOUNTER — Inpatient Hospital Stay

## 2024-12-01 DIAGNOSIS — K920 Hematemesis: Secondary | ICD-10-CM | POA: Diagnosis not present

## 2024-12-01 LAB — CBC
HCT: 34.3 % — ABNORMAL LOW (ref 39.0–52.0)
Hemoglobin: 11 g/dL — ABNORMAL LOW (ref 13.0–17.0)
MCH: 29.6 pg (ref 26.0–34.0)
MCHC: 32.1 g/dL (ref 30.0–36.0)
MCV: 92.2 fL (ref 80.0–100.0)
Platelets: 208 K/uL (ref 150–400)
RBC: 3.72 MIL/uL — ABNORMAL LOW (ref 4.22–5.81)
RDW: 13.5 % (ref 11.5–15.5)
WBC: 4.3 K/uL (ref 4.0–10.5)
nRBC: 0 % (ref 0.0–0.2)

## 2024-12-01 LAB — COMPREHENSIVE METABOLIC PANEL WITH GFR
ALT: 66 U/L — ABNORMAL HIGH (ref 0–44)
AST: 47 U/L — ABNORMAL HIGH (ref 15–41)
Albumin: 3 g/dL — ABNORMAL LOW (ref 3.5–5.0)
Alkaline Phosphatase: 99 U/L (ref 38–126)
Anion gap: 9 (ref 5–15)
BUN: 15 mg/dL (ref 6–20)
CO2: 27 mmol/L (ref 22–32)
Calcium: 8.6 mg/dL — ABNORMAL LOW (ref 8.9–10.3)
Chloride: 99 mmol/L (ref 98–111)
Creatinine, Ser: 0.67 mg/dL (ref 0.61–1.24)
GFR, Estimated: >60 mL/min (ref >60)
Glucose, Bld: 105 mg/dL — ABNORMAL HIGH (ref 70–99)
Potassium: 4.2 mmol/L (ref 3.5–5.1)
Sodium: 134 mmol/L — ABNORMAL LOW (ref 135–145)
Total Bilirubin: 0.5 mg/dL (ref 0.0–1.2)
Total Protein: 5.9 g/dL — ABNORMAL LOW (ref 6.5–8.1)

## 2024-12-01 LAB — MAGNESIUM: Magnesium: 2 mg/dL (ref 1.7–2.4)

## 2024-12-01 MED ORDER — MENTHOL 3 MG MT LOZG: 1.0000 | LOZENGE | OROMUCOSAL | Status: DC | PRN

## 2024-12-01 MED ORDER — DIATRIZOATE MEGLUMINE & SODIUM 66-10 % PO SOLN
90.0000 mL | Freq: Once | ORAL | Status: AC
  Administered 2024-12-01: 14:00:00 90 mL via NASOGASTRIC
  Filled 2024-12-01: qty 90

## 2024-12-01 MED ORDER — OCTREOTIDE ACETATE 50 MCG/ML IJ SOLN
50.0000 ug | Freq: Three times a day (TID) | INTRAMUSCULAR | Status: DC
  Administered 2024-12-01 – 2024-12-08 (×21): 50 ug via SUBCUTANEOUS
  Filled 2024-12-01 (×21): qty 1

## 2024-12-01 MED ORDER — PHENOL 1.4 % MT LIQD
1.0000 | OROMUCOSAL | Status: DC | PRN
  Administered 2024-12-07: 1 via OROMUCOSAL
  Filled 2024-12-01: qty 177

## 2024-12-01 MED ORDER — BOOST / RESOURCE BREEZE PO LIQD CUSTOM: 1.0000 | Freq: Two times a day (BID) | ORAL | Status: DC

## 2024-12-01 NOTE — Progress Notes (Addendum)
 Palliative:  HPI: 58 y.o. male  with past medical history of gastric cancer (new chemotherapy 12/12), DVT with IVC filter admitted on 11/27/2024 with nausea and vomiting with coffee ground emesis. EGD 12/4 showed grade D esophagitis without bleeding and malignant infiltrative gastric tumor in the gastric body and in the gastric antrum with antral narrowing. CT abdomen/pelvis with contrast 12/13 showed dilated proximal small bowel suggesting SBO but without discrete transition point, thick walled gastric body/antrum without definite focal mass, mild nodularity along the pylorus. GI is following with plans for EGD +/- stent 12/16. Surgery also consulted and following.   I met today at Henry May's bedside along with wife and daughter. Henry May was present and opened his eyes when I come in but did not participate in our conversation. He has still had nausea. He is having relief after NGT placed to suction. Reflected with family about output in canister and this is why he has felt so bad. We discussed cancer and progression. We discussed why chemotherapy is not a good option at current time as it can cause more harm than benefit since he is so ill. We also discussed that he is a very poor candidate for surgery due to advanced cancer. Family ask good questions. We did discuss concern that he is not progressing. We discussed that we do seem to be getting him some better relief from nausea/vomiting but nothing we are doing is helping him do better or improve but only to palliate symptoms for relief. We did discuss concern for advanced cancer and that options from here may be very limited. Wife is appropriately tearful. They tell me that they want to give a couple days to see if he can have any improvement with stent. They agree with ongoing support and conversations. We did discuss the importance of future conversations regarding wishes and options if not improving as we hope.   Family share that Henry May was  critically ill with COVID in 2019. They share that he required intubation and actually had cardiac arrest. They were told that he was not expected to survive at that time either but ultimately had full recovery. We did reflect that his current illness with advanced cancer is a much different situation that previous COVID illness. I offered and educated on access of hospital chaplain for additional support.   All questions/concerns addressed. Emotional support provided.   Exam: Resting. No distress. NGT to LIWS with bilious drainage. Breathing regular, unlabored.   Plan: - Time for outcomes - Ongoing palliative conversations and support - Began scheduled octreotide  SQ  40 min  Bernarda Kitty, NP Palliative Medicine Team Pager [phone] (Please see [email or web address] for schedule) Team Phone [phone]

## 2024-12-01 NOTE — Progress Notes (Signed)
 PROGRESS NOTE    Henry May  FMW:[id number] DOB: [birth year] DOA: 11/27/2024 PCP: [name], DO    Brief Narrative:  58 year old M with PMH of DVT with IVC filter, gastric cancer on chemo and esophagitis presented to [facility] due to nausea, vomiting and coffee-ground emesis 2 days after he was started new chemotherapy, and admitted for intractable nausea and vomiting, dilated small bowel loops and coffee-ground emesis.  Recent EGD on 12/4 with LA grade D esophagitis without bleeding, and large infiltrative, sessile and ulcerated circumferential mass with contact of bleeding in gastric body and antrum with antral narrowing and luminal diameter of approximately 1.5 cm.  In ED, stable vitals.  CBC and CMP without significant finding.  Lipase 171.  CT abdomen and pelvis showed dilated proximal small bowel suggesting SBO but without discrete transition point, thick walled gastric body/antrum without definite focal mass, mild nodularity along the pylorus, scarring with superimposed patchy peribronchovascular nodularity in the bilateral lower lobes right more than left suggesting mild aspiration or pneumonia.  Patient was started on antiemetics, IV fluid, PPI and admitted for further care.  Oncology, surgery, GI and palliative medicine consulted. 12/16, underwent upper GI endoscopy and stenting of the gastric narrowing.  Subjective: Patient seen and examined.  Daughter at the bedside.  Patient stated feeling nauseous on top of having NG tube drainage. Feels sick to his stomach.  Main complaint is acidity and reflux that is ongoing.  Vomited some water  that he tried. No bowel movement.   Assessment & Plan:   Intractable nausea and vomiting related to gastric cancer, gastric luminal narrowing and esophagitis: Remains on symptomatic management.  Continue IV fluid.  Clears food for comfort On IV Protonix  Scheduled Phenergan  and as needed Ativan  Dexamethasone  as per palliative care Upper  GI endoscopy and stenting of gastric stenosis-NG tube decompression. Hemoglobin has remained stable.  Currently no need for transfusion. Patient with advanced gastric cancer, followed by oncology.  Likely another course of chemotherapy if patient agrees.  Possible aspiration pneumonitis and pneumonia: Chest x-ray with right middle lobe opacity.  Bibasilar infiltrates.  On Unasyn .  Will continue for 7 days of therapy.  Can change to oral if able to take by mouth.  Type 2 diabetes, diet controlled: On SSI while on steroids.  History of DVT: Cannot be anticoagulated.  Status post IVC filter.  Goal of care: Aggressive gastric cancer with poor prognosis.  GI/oncology and palliative following.  Currently desires full code and hemotherapy.     DVT prophylaxis: SCDs Start: 11/28/24 1042   Code Status: Full code Family Communication: Daughter at the bedside Disposition Plan: Status is: Inpatient Remains inpatient appropriate because: Unable to eat     Consultants:  GI Palliative Oncology  Procedures:  Upper GI endoscopy and stenting of the gastric narrowing  Antimicrobials:  Unasyn      Objective: Vitals:   11/30/24 1659 11/30/24 1710 11/30/24 2100 12/01/24 0428  BP: (!) 147/85 (!) 152/94 132/82 (!) 147/88  Pulse: 73 73 61 76  Resp: (!) 26 18  20   Temp:   98 F (36.7 C) 98.4 F (36.9 C)  TempSrc:   Oral Oral  SpO2: 96% 96% 95% 97%  Weight:      Height:        Intake/Output Summary (Last 24 hours) at 12/01/2024 0803 Last data filed at 12/01/2024 0321 Gross per 24 hour  Intake 1331.78 ml  Output --  Net 1331.78 ml   Filed Weights   11/29/24 0511 11/30/24 1437  Weight: 70 kg 70 kg    Examination:  General exam: Appears sick looking, frail and debilitated.  Mildly anxious. Respiratory system: Clear to auscultation. Respiratory effort normal.  Poor air entry bilateral. Right chest wall Port-A-Cath present. Cardiovascular system: S1 & S2 heard, RRR. No JVD,  murmurs, rubs, gallops or clicks. No pedal edema. Gastrointestinal system: Mildly distended, mild tenderness on deep palpation.  Bowel sounds present. NG tube with bilious drainage. Central nervous system: Alert and oriented. No focal neurological deficits. Extremities: Symmetric 5 x 5 power.   Data Reviewed: I have personally reviewed following labs and imaging studies  CBC: Recent Labs  Lab 11/24/24 1330 11/27/24 1939 11/28/24 1536 11/29/24 0250 11/30/24 0441 12/01/24 0006  WBC 6.2 7.1 4.9 4.7 4.5 4.3  NEUTROABS 4.2  --   --   --   --   --   HGB 14.6 14.5 13.3 12.7* 11.7* 11.0*  HCT 42.5 41.5 39.6 38.4* 35.8* 34.3*  MCV 86.9 87.9 90.4 90.6 92.0 92.2  PLT 254 274 239 238 217 208   Basic Metabolic Panel: Recent Labs  Lab 11/24/24 1330 11/26/24 0917 11/27/24 1939 11/28/24 1536 11/29/24 0250 11/30/24 0441 12/01/24 0006  NA 139   < > 135 138 138 139 134*  K 3.8   < > 4.0 3.8 3.7 3.5 4.2  CL 99   < > 97* 102 102 103 99  CO2 27   < > 25 25 26 28 27   GLUCOSE 113*   < > 122* 119* 138* 138* 105*  BUN 26*   < > 29* 26* 23* 17 15  CREATININE 0.90   < > 0.83 0.87 0.85 0.73 0.67  CALCIUM  9.7   < > 10.0 9.2 9.0 8.8* 8.6*  MG 2.4  --   --  2.4  --  2.0 2.0   < > = values in this interval not displayed.   GFR: Estimated Creatinine Clearance: 97.2 mL/min (by C-G formula based on SCr of 0.67 mg/dL). Liver Function Tests: Recent Labs  Lab 11/27/24 1939 11/28/24 1536 11/29/24 0250 11/30/24 0441 12/01/24 0006  AST 25 21 18 17  47*  ALT 62* 51* 45* 35 66*  ALKPHOS 101 82 77 77 99  BILITOT 0.6 0.4 0.4 0.4 0.5  PROT 7.7 6.8 6.6 6.1* 5.9*  ALBUMIN  4.0 3.6 3.4* 3.1* 3.0*   Recent Labs  Lab 11/27/24 1939 11/30/24 0441  LIPASE 171* 53*   No results for input(s): AMMONIA in the last 168 hours. Coagulation Profile: Recent Labs  Lab 11/27/24 1939  INR 1.1   Cardiac Enzymes: No results for input(s): CKTOTAL, CKMB, CKMBINDEX, TROPONINI in the last 168 hours. BNP  (last 3 results) No results for input(s): PROBNP in the last 8760 hours. HbA1C: Recent Labs    11/28/24 1536  HGBA1C 5.7*   CBG: No results for input(s): GLUCAP in the last 168 hours. Lipid Profile: No results for input(s): CHOL, HDL, LDLCALC, TRIG, CHOLHDL, LDLDIRECT in the last 72 hours. Thyroid  Function Tests: No results for input(s): TSH, T4TOTAL, FREET4, T3FREE, THYROIDAB in the last 72 hours. Anemia Panel: No results for input(s): VITAMINB12, FOLATE, FERRITIN, TIBC, IRON, RETICCTPCT in the last 72 hours. Sepsis Labs: No results for input(s): PROCALCITON, LATICACIDVEN in the last 168 hours.  No results found for this or any previous visit (from the past 240 hours).       Radiology Studies: DG C-Arm 1-60 Min-No Report Result Date: 11/30/2024 Fluoroscopy was utilized by the requesting physician.  No radiographic interpretation.  Scheduled Meds:  Chlorhexidine  Gluconate Cloth  6 each Topical Daily   dexamethasone  (DECADRON ) injection  2 mg Intravenous Q12H   pantoprazole  (PROTONIX ) IV  40 mg Intravenous Q12H   Continuous Infusions:  ampicillin -sulbactam (UNASYN ) IV 3 g (12/01/24 0413)   promethazine  (PHENERGAN ) injection (IM or IVPB) 12.5 mg (12/01/24 0553)     LOS: 3 days    Time spent: 51 minutes    Renato Applebaum, MD [facility]

## 2024-12-01 NOTE — Progress Notes (Signed)
 IP PROGRESS NOTE  Subjective:   [name] underwent an upper endoscopy with placement of a gastric stent yesterday.  He reports feeling improved, but he vomited twice this morning. Objective: Vital signs in last 24 hours: Blood pressure (!) 147/88, pulse 76, temperature 98.4 F (36.9 C), temperature source Oral, resp. rate 20, height 6' (1.829 m), weight 154 lb 5.2 oz (70 kg), SpO2 97%.  Intake/Output from previous day: 12/16 0701 - 12/17 0700 In: 1331.8 [I.V.:750; IV Piggyback:581.8] Out: -   Physical Exam:  HEENT: No thrush, NG tube in place Lungs: Clear bilaterally Cardiac: Regular rate and rhythm Abdomen: Soft, nontender, no hepatomegaly Extremities: No leg edema   Portacath/PICC-without erythema  Lab Results: Recent Labs    11/30/24 0441 12/01/24 0006  WBC 4.5 4.3  HGB 11.7* 11.0*  HCT 35.8* 34.3*  PLT 217 208    BMET Recent Labs    11/30/24 0441 12/01/24 0006  NA 139 134*  K 3.5 4.2  CL 103 99  CO2 28 27  GLUCOSE 138* 105*  BUN 17 15  CREATININE 0.73 0.67  CALCIUM  8.8* 8.6*    Lab Results  Component Value Date   CEA 3.57 01/15/2024    Studies/Results: DG C-Arm 1-60 Min-No Report Result Date: 11/30/2024 Fluoroscopy was utilized by the requesting physician.  No radiographic interpretation.    Medications: I have reviewed the patient's current medications.  Assessment/Plan: Gastric cancer 11/21/2023 EGD-gastric body with infiltrative looking lesion; biopsy shows involvement of a hypercellular lesion that suggests diffuse carcinoma by morphology CTs abdomen/pelvis 11/28/2023-diffuse fatty infiltration of the liver; gastric wall thickening; lymph nodes up to 6 mm in diameter near the stomach wall. 12/31/2023 upper endoscopy-large diffuse friable infiltrative polypoid and ulcerated circumferential mass found in the gastric body.  Scope was passed beyond the mass with normal antrum/pylorus.  The mass came within 2 cm of the GE junction; biopsy  shows poorly differentiated adenocarcinoma with signet ring cells.  Negative for HER2 (1+); mismatch repair protein IHC normal; PD-L1 CPS 0%; CLDN18 positive: 90% of tumor cells with 2+/3+ membrane staining PET scan 01/07/2024-circumferential hypermetabolic gastric mucosal thickening.  Ill-defined hypermetabolic lymph nodes in the gastrohepatic ligament.  Intense hypermetabolic activity associated with the right lobe of the thyroid  gland.  Small hypermetabolic right axillary node favored reactive. 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  Gastric wall thickening slightly improved. Cycle 7 oxaliplatin , zolbetuximab 05/05/2024, Fulphila  Cycle 8 zolbetuximab 05/20/2024, oxaliplatin  held due to neuropathy, no Fulphila  Cycle 9 oxaliplatin /zolbetuximab 06/03/2024, Fulphila  Cycle 10 oxaliplatin /zolbetuximab 06/17/2024, Fulphila  Cycle 11 oxaliplatin /zolbetuximab 06/30/2024, Fulphila , oxaliplatin  dose reduced Cycle 12 zolbetuximab 07/15/2024, oxaliplatin  held secondary to neuropathy Cycle 13 zolbetuximab 07/29/2024, oxaliplatin  held secondary to neuropathy Cycle 14 zolbetuximab 08/26/2024, oxaliplatin  held secondary to neuropathy 09/01/2024 CTs-mild distal gastric wall thickening without discrete mass.  No metastatic disease. Cycle 15 zolbetuximab 09/09/2024, oxaliplatin  held due to neuropathy Cycle 16 zolbetuximab 09/30/2024,  oxaliplatin  held due to neuropathy Cycle 17 zolbetuximab 10/21/2024, oxaliplatin  held due to neuropathy 11/03/2024 CT abdomen/pelvis: Diffuse mild wall thickening of the distal gastric body and antrum-stable, mild increase in a right gastric lymph node small volume ascites without  omental nodularity, proximal jejunal mural thickening 11/04/2024 upper GI: Narrowing of the gastric antrum, normal gastric emptying, dilated duodenum and proximal jejunum without evidence of a bowel obstruction 11/18/2024 upper endoscopy: Tumor in the gastric body and antrum with antral narrowing, dilated duodenum without evidence of mass or obstruction, esophagitis: Biopsy of the gastric mass-poorly differentiated adenocarcinoma with focal signet ring morphology, esophagus biopsy: Squamous mucosa with focal ulceration Cycle 1 weekly cisplatin  11/26/2024 Early satiety, postprandial abdominal pain, weight loss secondary to #1 History of bilateral lower extremity DVT 2021-anticoagulation discontinued due to massive retroperitoneal bleed, IVC filter placed 08/24/2020 Hospitalized with acute respiratory failure due to COVID-19 08/06/2020 - 09/04/2020 History of massive retroperitoneal bleed secondary to anticoagulation September 2021 Thyroid  ultrasound 01/13/2024-no abnormal nodule identified in the right lobe.  1.1 cm thyroid  isthmus nodule meets criteria for 1 year follow-up ultrasound. Admission 02/01/2024 with increased abdominal pain/ascites Hospitalization with altered mental status 02/05/2024 through 02/13/2024; question rare case of 5-FU psychosis.  Improved 02/16/2024.  Mental status at baseline 02/23/2024. Neutropenia 02/16/2024 Mucositis 02/16/2024.  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  Persistent cold sensitivity 05/20/2024, oxaliplatin  held.  Cold  sensitivity resolved 06/03/2024, oxaliplatin  resumed.  Mild loss of vibratory sense, oxaliplatin  dose reduced 06/29/2024 Admission 11/28/2024 with intractable nausea and vomiting 11/27/2024 CT Abdo/pelvis: Bilateral lower lobe aspiration/pneumonia, thick-walled gastric body/antrum, dilated proximal small bowel, small volume pelvic ascites 11/30/2024 EGD: Malignant appearing severe stenosis at the gastric antrum-stent placed, dilated duodenum with retained fluid   Mr Henry May has metastatic gastric cancer.  There is been recent clinical evidence of disease progression with persistent/progressive tumor in the stomach and intractable nausea/vomiting.  The omental disease on presentation last year remains significantly improved.  He responded well to initial treatment with oxaliplatin  based chemotherapy.  He has neuropathy symptoms.  He began a trial of salvage therapy with cisplatin  last week.  He is now admitted with intractable nausea/vomiting, potentially related to a small bowel obstruction in addition to persistent tumor in the stomach.  I reviewed the admission CT images.  It is possible the nausea is in part due to the low-dose cisplatin , but I think this is less likely.  We have seen him multiple times over the past month with nausea and vomiting. An upper endoscopy yesterday confirmed gastric antral stenosis and dilation of the duodenum.  There was retained fluid in the stomach and duodenum.  A gastric antral stent was placed.  An NG tube was placed.  He is now on clear liquids.  He vomited this morning. We remain concerned he could have a more distal bowel obstruction.  He may be a candidate to continue weekly cisplatin  chemotherapy.  He will likely require home TPN.  Recommendations: Continue antibiotics for the suspected aspiration pneumonia Diet, NG tube management per gastroenterology Ativan  as needed for nausea Plan to continue weekly cisplatin  chemotherapy as an outpatient   LOS: 3 days    Arley Hof, MD   12/01/2024, 1:20 PM

## 2024-12-01 NOTE — Anesthesia Postprocedure Evaluation (Signed)
 Anesthesia Post Note  Patient: Henry May  Procedure(s) Performed: EGD (ESOPHAGOGASTRODUODENOSCOPY) INSERTION, STENT, DUODENUM     Patient location during evaluation: Endoscopy Anesthesia Type: MAC Level of consciousness: awake and alert Pain management: pain level controlled Vital Signs Assessment: post-procedure vital signs reviewed and stable Respiratory status: spontaneous breathing, nonlabored ventilation, respiratory function stable and patient connected to nasal cannula oxygen  Cardiovascular status: stable and blood pressure returned to baseline Postop Assessment: no apparent nausea or vomiting Anesthetic complications: no   No notable events documented.  Last Vitals:  Vitals:   11/30/24 2100 12/01/24 0428  BP: 132/82 (!) 147/88  Pulse: 61 76  Resp:  20  Temp: 36.7 C 36.9 C  SpO2: 95% 97%    Last Pain:  Vitals:   12/01/24 0428  TempSrc: Oral  PainSc:                  Lebron Nauert S

## 2024-12-01 NOTE — Progress Notes (Addendum)
 [facility] Progress Note  CC:  Gastric cancer, nausea and vomiting   Subjective:  Slept better last night without vomiting but has vomited 3 times already this AM.  NGT still in place but clamped.  Sipping on clear liquids.  No significant abdominal pain.  Daughter at bedside.  Objective:  Vital signs in last 24 hours: Temp:  [97.7 F (36.5 C)-98.6 F (37 C)] 98.4 F (36.9 C) (12/17 0428) Pulse Rate:  [61-80] 76 (12/17 0428) Resp:  [16-26] 20 (12/17 0428) BP: (132-152)/(81-94) 147/88 (12/17 0428) SpO2:  [94 %-97 %] 97 % (12/17 0428) Weight:  [70 kg] 70 kg (12/16 1437) Last BM Date : 12/03/24 General:  Alert, in NAD Heart:  Regular rate and rhythm; no murmurs Pulm:  CTAB.  No W/R/R. Abdomen:  Soft, non-distended.  BS very quiet and sparse.  Non-tender. Extremities:  Without edema. Neurologic:  Alert and oriented x 4;  grossly normal neurologically. Psych:  Alert and cooperative. Normal mood and affect.  Intake/Output from previous day: 12/16 0701 - 12/17 0700 In: 1331.8 [I.V.:750; IV Piggyback:581.8] Out: -   Lab Results: Recent Labs    11/29/24 0250 11/30/24 0441 12/01/24 0006  WBC 4.7 4.5 4.3  HGB 12.7* 11.7* 11.0*  HCT 38.4* 35.8* 34.3*  PLT 238 217 208   BMET Recent Labs    11/29/24 0250 11/30/24 0441 12/01/24 0006  NA 138 139 134*  K 3.7 3.5 4.2  CL 102 103 99  CO2 26 28 27   GLUCOSE 138* 138* 105*  BUN 23* 17 15  CREATININE 0.85 0.73 0.67  CALCIUM  9.0 8.8* 8.6*   LFT Recent Labs    12/01/24 0006  PROT 5.9*  ALBUMIN  3.0*  AST 47*  ALT 66*  ALKPHOS 99  BILITOT 0.5   Assessment / Plan: #1.  Worsening N/V - d/t ?Cisplatin  based chemo. Pt with antral stenosis, gastric stasis without mechanical outlet obstruction on recent UGI series and EGD.  EGD 12/16: - LA Grade D esophagitis throughout. - Z- line irregular. - Granular, inflamed and texture changed mucosa in the stomach - linnitus pattern. - Retained gastric fluid -  suctioned via endoscope. 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 Prosthesis placed. - Duodenal dilation deformity throughout. - Retained food in the duodenum. - NGT decompression tube placement was successfully performed.   #2. Dilated SB loops- may represent functional obs d/t known peritoneal carcinomatosis, impaired peristalsis (Malignant ileus). Barium does reach colon in 3 hrs.   #3. Stage IV Gastric Adeno Ca   #4  DVT status post IVC filter, no anticoagulation  -Will see how he does with clear liquids, but if continues vomiting may need to stop and put NGT to LIS. -Appreciate surgeries input and palliative care in assisting with symptoms management.  Will leave anti-emetic regimen to palliative care (has had some relief with Ativan  and Haldol ). -Continue pantoprazole  40 mg IV BID.     LOS: 3 days   Harlene BIRCH. Zehr  12/01/2024, 9:09 AM    Attending physician's note   I have taken history, reviewed the chart and examined the patient. I performed a substantive portion of this encounter, including complete performance of at least one of the key components, in conjunction with the APP. I agree with the Advanced Practitioner's note, impression and recommendations.   S/P EGD with gastric stent (6 cm, antrum to pylorus) with NG tube Gastrograffin study is progress  Plan: - Follow Gastrografin  study. - Appreciate  surgical consultation.  - Continue supportive treatment for now. - Continue IV Protonix , lorazepam  and Sandostatin  - Hopefully will be able to tolerate CLD. - D/W pt and family   Anselm Bring, MD [facility] [phone]

## 2024-12-01 NOTE — Progress Notes (Signed)
 1 Day Post-Op  Subjective: Continues to vomit and vomited while I was in the room.    ROS: See above, otherwise other systems negative  Objective: Vital signs in last 24 hours: Temp:  [97.7 F (36.5 C)-98.6 F (37 C)] 98.4 F (36.9 C) (12/17 0428) Pulse Rate:  [61-80] 76 (12/17 0428) Resp:  [16-26] 20 (12/17 0428) BP: (132-152)/(81-94) 147/88 (12/17 0428) SpO2:  [94 %-97 %] 97 % (12/17 0428) Weight:  [70 kg] 70 kg (12/16 1437) Last BM Date : 12/03/24  Intake/Output from previous day: 12/16 0701 - 12/17 0700 In: 1331.8 [I.V.:750; IV Piggyback:581.8] Out: -  Intake/Output this shift: No intake/output data recorded.  PE: Gen: appears to feel unwell Abd: soft, not really tender, ND, NGT put to suction with about 200cc of slightly red/bilious output after moderate volume emesis prior to this.  Lab Results:  Recent Labs    11/30/24 0441 12/01/24 0006  WBC 4.5 4.3  HGB 11.7* 11.0*  HCT 35.8* 34.3*  PLT 217 208   BMET Recent Labs    11/30/24 0441 12/01/24 0006  NA 139 134*  K 3.5 4.2  CL 103 99  CO2 28 27  GLUCOSE 138* 105*  BUN 17 15  CREATININE 0.73 0.67  CALCIUM  8.8* 8.6*   PT/INR No results for input(s): LABPROT, INR in the last 72 hours.  CMP     Component Value Date/Time   NA 134 (L) 12/01/2024 0006   K 4.2 12/01/2024 0006   CL 99 12/01/2024 0006   CO2 27 12/01/2024 0006   GLUCOSE 105 (H) 12/01/2024 0006   BUN 15 12/01/2024 0006   CREATININE 0.67 12/01/2024 0006   CREATININE 0.83 11/26/2024 0917   CALCIUM  8.6 (L) 12/01/2024 0006   PROT 5.9 (L) 12/01/2024 0006   ALBUMIN  3.0 (L) 12/01/2024 0006   AST 47 (H) 12/01/2024 0006   AST 25 11/09/2024 0910   ALT 66 (H) 12/01/2024 0006   ALT 37 11/09/2024 0910   ALKPHOS 99 12/01/2024 0006   BILITOT 0.5 12/01/2024 0006   BILITOT 0.4 11/09/2024 0910   GFRNONAA >60 12/01/2024 0006   GFRNONAA >60 11/26/2024 0917   GFRAA >60 09/03/2020 0836   Lipase     Component Value Date/Time   LIPASE 53  (H) 11/30/2024 0441       Studies/Results: DG C-Arm 1-60 Min-No Report Result Date: 11/30/2024 Fluoroscopy was utilized by the requesting physician.  No radiographic interpretation.    Anti-infectives: Anti-infectives (From admission, onward)    Start     Dose/Rate Route Frequency Ordered Stop   11/28/24 1200  Ampicillin -Sulbactam (UNASYN ) 3 g in sodium chloride  0.9 % 100 mL IVPB        3 g 200 mL/hr over 30 Minutes Intravenous Every 6 hours 11/28/24 1048     11/28/24 0045  Ampicillin -Sulbactam (UNASYN ) 3 g in sodium chloride  0.9 % 100 mL IVPB        3 g 200 mL/hr over 30 Minutes Intravenous  Once 11/28/24 0042 11/28/24 0134        Assessment/Plan Stage IV gastric cancer on cisplatin   Nausea/vomiting with possible psbo  -EGD with stent placement yesterday -continues to have N/V.  NGT placed to LIWS, NPO, and will start the SBO protocol to eval for blockage more distal   -will follow.  If patient does have an obstruction, not sure what can be offered surgically.    FEN - NPO/IVFs VTE - SCDs ID - unasyn  for aspiration PNA  I  reviewed Consultant GI notes, hospitalist notes, last 24 h vitals and pain scores, last 48 h intake and output, last 24 h labs and trends, and last 24 h imaging results.   LOS: 3 days    Burnard FORBES Banter , [facility] Surgery 12/01/2024, 11:01 AM Please see Amion for pager number during day hours 7:00am-4:30pm or 7:00am -11:30am on weekends

## 2024-12-02 ENCOUNTER — Encounter (HOSPITAL_COMMUNITY): Payer: Self-pay | Admitting: Gastroenterology

## 2024-12-02 ENCOUNTER — Inpatient Hospital Stay (HOSPITAL_COMMUNITY)

## 2024-12-02 DIAGNOSIS — K920 Hematemesis: Secondary | ICD-10-CM | POA: Diagnosis not present

## 2024-12-02 MED ORDER — SMOG ENEMA
960.0000 mL | Freq: Once | RECTAL | Status: AC
  Administered 2024-12-02: 11:00:00 960 mL via RECTAL
  Filled 2024-12-02: qty 960

## 2024-12-02 MED ORDER — ORAL CARE MOUTH RINSE: 15.0000 mL | OROMUCOSAL | Status: DC | PRN

## 2024-12-02 MED ORDER — ORAL CARE MOUTH RINSE
15.0000 mL | OROMUCOSAL | Status: DC
  Administered 2024-12-02 – 2025-01-05 (×128): 15 mL via OROMUCOSAL

## 2024-12-02 MED ORDER — BOOST / RESOURCE BREEZE PO LIQD CUSTOM
1.0000 | Freq: Three times a day (TID) | ORAL | Status: DC
  Administered 2024-12-03 (×2): 1 via ORAL

## 2024-12-02 NOTE — TOC Progression Note (Signed)
 Transition of Care [facility]) - Progression Note   Patient Details  Name: Henry May MRN: [id number] Date of Birth: 08-05-1966  [facility]) CM/SW Contact  Duwaine GORMAN Aran, LCSW Phone Number: 12/02/2024, 10:15 AM  Clinical Narrative: Care management continuing to follow for home TPN needs.  Barriers to Discharge: Continued Medical Work up  Social Drivers of Health (SDOH) Interventions SDOH Screenings   Food Insecurity: Patient Declined (11/28/2024)  Housing: Unknown (11/28/2024)  Transportation Needs: Patient Declined (11/28/2024)  Utilities: Patient Declined (11/28/2024)  Depression (PHQ2-9): Medium Risk (11/26/2024)  Social Connections: Unknown (04/30/2022)   Received from [facility]  Tobacco Use: Medium Risk (11/30/2024)   Readmission Risk Interventions    02/07/2024    8:40 AM  Readmission Risk Prevention Plan  Transportation Screening Complete  HRI or Home Care Consult Complete  Social Work Consult for Recovery Care Planning/Counseling Complete  Palliative Care Screening Not Applicable  Medication Review [employer]) Complete

## 2024-12-02 NOTE — Progress Notes (Signed)
 Palliative:  HPI: 58 y.o. male with past medical history of gastric cancer (new chemotherapy 12/12), DVT with IVC filter admitted on 11/27/2024 with nausea and vomiting with coffee ground emesis. EGD 12/4 showed grade D esophagitis without bleeding and malignant infiltrative gastric tumor in the gastric body and in the gastric antrum with antral narrowing. CT abdomen/pelvis with contrast 12/13 showed dilated proximal small bowel suggesting SBO but without discrete transition point, thick walled gastric body/antrum without definite focal mass, mild nodularity along the pylorus. GI is following with plans for EGD +/- stent 12/16. Surgery also consulted and following.   I discussed with surgery PA. I met today at Henry May's bedside along with his wife and daughter. He is present but resting during my visit. Family report that NGT has been clamped and he had chicken broth ~30 minutes prior and so far tolerating. They are very hopeful that he will be able to progress to tolerate intake. They are happy there is no overt obstruction. They are very hopeful that he can tolerate intake. They share that his appetite and taste has had significant changes - I shared this is very common as a side effect from cancer as well as chemotherapy. They share that they are considering restarting chemotherapy next week if tolerating intake. We discussed taking small and frequent intake instead of large meals. We discussed intake of easily digested foods and avoiding foods that cause gas/bloating. They ask about TPN and chemotherapy. We did discuss that this does potentially happen. However, there is a major concern of risk of infection with TPN along with risks and side effects of chemotherapy. Family express understanding. They are hopeful to avoid TPN. Time for outcomes.   All questions/concerns addressed. Emotional support provided.   Exam: Resting. No distress. NGT to LIWS with bilious drainage. Breathing regular, unlabored.   Nausea improved today.    Plan: - Time for outcomes - Ongoing palliative conversations and support - Began scheduled octreotide  SQ  35 min   Bernarda Kitty, NP Palliative Medicine Team Pager [phone] (Please see [email or web address] for schedule) Team Phone [phone]

## 2024-12-02 NOTE — Progress Notes (Signed)
 2 Days Post-Op  Subjective: Feels better today with NGT in place.  No further nausea.  Passing flatus but no BM in about 4 days.  ROS: See above, otherwise other systems negative  Objective: Vital signs in last 24 hours: Temp:  [98 F (36.7 C)-98.5 F (36.9 C)] 98 F (36.7 C) (12/18 0514) Pulse Rate:  [67-78] 78 (12/18 0514) Resp:  [16-20] 20 (12/18 0517) BP: (131-138)/(83-90) 131/89 (12/18 0514) SpO2:  [94 %-97 %] 96 % (12/18 0514) Last BM Date : 11/26/24 (per patient)  Intake/Output from previous day: 12/17 0701 - 12/18 0700 In: 754.8 [NG/GT:60; IV Piggyback:694.8] Out: 1000 [Emesis/NG output:1000] Intake/Output this shift: No intake/output data recorded.  PE: Gen: appears to feel unwell Abd: soft, not really tender, ND, NGT put out about 1L yesterday.  Lab Results:  Recent Labs    11/30/24 0441 12/01/24 0006  WBC 4.5 4.3  HGB 11.7* 11.0*  HCT 35.8* 34.3*  PLT 217 208   BMET Recent Labs    11/30/24 0441 12/01/24 0006  NA 139 134*  K 3.5 4.2  CL 103 99  CO2 28 27  GLUCOSE 138* 105*  BUN 17 15  CREATININE 0.73 0.67  CALCIUM  8.8* 8.6*   PT/INR No results for input(s): LABPROT, INR in the last 72 hours.  CMP     Component Value Date/Time   NA 134 (L) 12/01/2024 0006   K 4.2 12/01/2024 0006   CL 99 12/01/2024 0006   CO2 27 12/01/2024 0006   GLUCOSE 105 (H) 12/01/2024 0006   BUN 15 12/01/2024 0006   CREATININE 0.67 12/01/2024 0006   CREATININE 0.83 11/26/2024 0917   CALCIUM  8.6 (L) 12/01/2024 0006   PROT 5.9 (L) 12/01/2024 0006   ALBUMIN  3.0 (L) 12/01/2024 0006   AST 47 (H) 12/01/2024 0006   AST 25 11/09/2024 0910   ALT 66 (H) 12/01/2024 0006   ALT 37 11/09/2024 0910   ALKPHOS 99 12/01/2024 0006   BILITOT 0.5 12/01/2024 0006   BILITOT 0.4 11/09/2024 0910   GFRNONAA >60 12/01/2024 0006   GFRNONAA >60 11/26/2024 0917   GFRAA >60 09/03/2020 0836   Lipase     Component Value Date/Time   LIPASE 53 (H) 11/30/2024 0441        Studies/Results: DG Abd Portable 1V-Small Bowel Obstruction Protocol-initial, 8 hr delay Result Date: 12/01/2024 EXAM: 1 VIEW XRAY OF THE ABDOMEN 12/01/2024 10:19:00 PM COMPARISON: 11/29/2024 CLINICAL HISTORY: FINDINGS: LINES, TUBES AND DEVICES: Enteric tube in place, not well visualized distally. IVC filter in place. Stent in right upper quadrant. BOWEL: Nonobstructive bowel gas pattern. Stable contrast material is noted throughout the colon, unchanged from the prior exam. Recently administered contrast lies within mildly dilated loops of small bowel. 24-hour film is recommended. SOFT TISSUES: No abnormal calcifications. BONES: No acute fracture. IMPRESSION: 1. Mildly dilated loops of small bowel with recently administered contrast; 24-hour film is recommended. 2. Stable contrast material throughout the colon, unchanged from the prior exam. Electronically signed by: Oneil Devonshire MD 12/01/2024 11:47 PM EST RP Workstation: [email or web address]   DG C-Arm 1-60 Min-No Report Result Date: 11/30/2024 Fluoroscopy was utilized by the requesting physician.  No radiographic interpretation.    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  SBO protocol with a nonobstructive bowel gas pattern.  Contrast in colon appears old from UGI in November, but oral contrast is in the small bowel and distal past area of concern on CT that was in proximal jejunum. -at  this point I suspect his issue is likely more a motility one as opposed to a true obstruction.  He could have a partial, but his x-ray looks good -can try to clamp NGT and try clears and see how he does.  I'm not sure long-term with his stage IV disease that this may not remain a problem. -no indications for surgery, but not sure that he would be a surgical candidate if there was an indication. -I have relayed this info to primary, GI, onc, dietitian in a secure chat  FEN - NGT clamp/trial of clears/IVFs VTE - SCDs ID - [email or web address]  for aspiration PNA  I reviewed Consultant GI notes, hospitalist notes, last 24 h vitals and pain scores, last 48 h intake and output, last 24 h labs and trends, and last 24 h imaging results.   LOS: 4 days    Burnard FORBES Banter , [facility] Surgery 12/02/2024, 11:15 AM Please see Amion for pager number during day hours 7:00am-4:30pm or 7:00am -11:30am on weekends

## 2024-12-02 NOTE — Progress Notes (Signed)
 PROGRESS NOTE    Henry May  FMW:[id number] DOB: February 10, 1966 DOA: 11/27/2024 PCP: [name], DO    Brief Narrative:  58 year old M with PMH of DVT with IVC filter, gastric cancer on chemo and esophagitis presented to [facility] ED due to nausea, vomiting and coffee-ground emesis 2 days after he was started new chemotherapy, and admitted for intractable nausea and vomiting, dilated small bowel loops and coffee-ground emesis.  Recent EGD on 12/4 with LA grade D esophagitis without bleeding, and large infiltrative, sessile and ulcerated circumferential mass with contact of bleeding in gastric body and antrum with antral narrowing and luminal diameter of approximately 1.5 cm.  In ED, stable vitals.  CBC and CMP without significant finding.  Lipase 171.  CT abdomen and pelvis showed dilated proximal small bowel suggesting SBO but without discrete transition point, thick walled gastric body/antrum without definite focal mass, mild nodularity along the pylorus, scarring with superimposed patchy peribronchovascular nodularity in the bilateral lower lobes right more than left suggesting mild aspiration or pneumonia.  Patient was started on antiemetics, IV fluid, PPI and admitted for further care.  Oncology, surgery, GI and palliative medicine consulted. 12/16, underwent upper GI endoscopy and stenting of the gastric narrowing.  Subjective: Patient seen and examined.  Wife and daughter at the bedside.  Patient reported getting better sleep last night.  He is hungry today.  Does not have epigastric discomfort or distention that he had yesterday.  Passing some flatus.  No bowel movement.  Wanted to try some food.  I discussed with surgery, small bowel follow-through and a smog enema today.  Anticipate he can try some liquid with clamping of NG tube but will defer to surgery.   Assessment & Plan:   Intractable nausea and vomiting related to gastric cancer, gastric luminal narrowing and  esophagitis: Remains on symptomatic management.  Continue IV fluid.  May advance to clears food for comfort On IV Protonix  Scheduled Phenergan  and as needed Ativan  Dexamethasone  as per palliative care Upper GI endoscopy and stenting of gastric stenosis-NG tube decompression. Hemoglobin has remained stable.  Currently no need for transfusion. Patient with advanced gastric cancer, followed by oncology.    Possible aspiration pneumonitis and pneumonia: Chest x-ray with right middle lobe opacity.  Bibasilar infiltrates.  On Unasyn .  Will continue for 7 days of therapy.  Can change to oral if able to take by mouth.  Type 2 diabetes, diet controlled: On SSI while on steroids.  History of DVT: Cannot be anticoagulated.  Status post IVC filter.  Goal of care: Aggressive gastric cancer with poor prognosis.  GI/oncology and palliative following.  Currently desires full code and chemotherapy. Patient likely not a good candidate for TPN, may be associated with very high infection rate and complications on chemotherapy. Hopefully he can start eating soon.     DVT prophylaxis: SCDs Start: 11/28/24 1042   Code Status: Full code Family Communication: Daughter and wife at the bedside Disposition Plan: Status is: Inpatient Remains inpatient appropriate because: Unable to eat     Consultants:  GI Palliative Oncology  Procedures:  Upper GI endoscopy and stenting of the gastric narrowing  Antimicrobials:  Unasyn      Objective: Vitals:   12/01/24 1400 12/01/24 2053 12/02/24 0514 12/02/24 0517  BP: 138/83 (!) 131/90 131/89   Pulse: 72 67 78   Resp: 16 16 18 20   Temp: 98.1 F (36.7 C) 98.5 F (36.9 C) 98 F (36.7 C)   TempSrc:   Oral   SpO2: 94%  97% 96%   Weight:      Height:        Intake/Output Summary (Last 24 hours) at 12/02/2024 1129 Last data filed at 12/02/2024 0520 Gross per 24 hour  Intake 754.81 ml  Output 1000 ml  Net -245.19 ml   Filed Weights   11/29/24 0511  11/30/24 1437  Weight: 70 kg 70 kg    Examination:  General exam: Chronically sick looking and frail but more comfortable and interactive today.  Looks pleasant. Respiratory system: Clear to auscultation. Respiratory effort normal.  Poor air entry bilateral. Right chest wall Port-A-Cath present. Cardiovascular system: S1 & S2 heard, RRR. No JVD, murmurs, rubs, gallops or clicks. No pedal edema. Gastrointestinal system: No distention, mild tenderness on deep palpation.  Bowel sounds present. NG tube with minimal bilious drainage. Central nervous system: Alert and oriented. No focal neurological deficits. Extremities: Symmetric 5 x 5 power.   Data Reviewed: I have personally reviewed following labs and imaging studies  CBC: Recent Labs  Lab 11/27/24 1939 11/28/24 1536 11/29/24 0250 11/30/24 0441 12/01/24 0006  WBC 7.1 4.9 4.7 4.5 4.3  HGB 14.5 13.3 12.7* 11.7* 11.0*  HCT 41.5 39.6 38.4* 35.8* 34.3*  MCV 87.9 90.4 90.6 92.0 92.2  PLT 274 239 238 217 208   Basic Metabolic Panel: Recent Labs  Lab 11/27/24 1939 11/28/24 1536 11/29/24 0250 11/30/24 0441 12/01/24 0006  NA 135 138 138 139 134*  K 4.0 3.8 3.7 3.5 4.2  CL 97* 102 102 103 99  CO2 25 25 26 28 27   GLUCOSE 122* 119* 138* 138* 105*  BUN 29* 26* 23* 17 15  CREATININE 0.83 0.87 0.85 0.73 0.67  CALCIUM  10.0 9.2 9.0 8.8* 8.6*  MG  --  2.4  --  2.0 2.0   GFR: Estimated Creatinine Clearance: 97.2 mL/min (by C-G formula based on SCr of 0.67 mg/dL). Liver Function Tests: Recent Labs  Lab 11/27/24 1939 11/28/24 1536 11/29/24 0250 11/30/24 0441 12/01/24 0006  AST 25 21 18 17  47*  ALT 62* 51* 45* 35 66*  ALKPHOS 101 82 77 77 99  BILITOT 0.6 0.4 0.4 0.4 0.5  PROT 7.7 6.8 6.6 6.1* 5.9*  ALBUMIN  4.0 3.6 3.4* 3.1* 3.0*   Recent Labs  Lab 11/27/24 1939 11/30/24 0441  LIPASE 171* 53*   No results for input(s): AMMONIA in the last 168 hours. Coagulation Profile: Recent Labs  Lab 11/27/24 1939  INR 1.1    Cardiac Enzymes: No results for input(s): CKTOTAL, CKMB, CKMBINDEX, TROPONINI in the last 168 hours. BNP (last 3 results) No results for input(s): PROBNP in the last 8760 hours. HbA1C: No results for input(s): HGBA1C in the last 72 hours.  CBG: No results for input(s): GLUCAP in the last 168 hours. Lipid Profile: No results for input(s): CHOL, HDL, LDLCALC, TRIG, CHOLHDL, LDLDIRECT in the last 72 hours. Thyroid  Function Tests: No results for input(s): TSH, T4TOTAL, FREET4, T3FREE, THYROIDAB in the last 72 hours. Anemia Panel: No results for input(s): VITAMINB12, FOLATE, FERRITIN, TIBC, IRON, RETICCTPCT in the last 72 hours. Sepsis Labs: No results for input(s): PROCALCITON, LATICACIDVEN in the last 168 hours.  No results found for this or any previous visit (from the past 240 hours).       Radiology Studies: DG Abd Portable 1V-Small Bowel Obstruction Protocol-initial, 8 hr delay Result Date: 12/01/2024 EXAM: 1 VIEW XRAY OF THE ABDOMEN 12/01/2024 10:19:00 PM COMPARISON: 11/29/2024 CLINICAL HISTORY: FINDINGS: LINES, TUBES AND DEVICES: Enteric tube in place, not well visualized  distally. IVC filter in place. Stent in right upper quadrant. BOWEL: Nonobstructive bowel gas pattern. Stable contrast material is noted throughout the colon, unchanged from the prior exam. Recently administered contrast lies within mildly dilated loops of small bowel. 24-hour film is recommended. SOFT TISSUES: No abnormal calcifications. BONES: No acute fracture. IMPRESSION: 1. Mildly dilated loops of small bowel with recently administered contrast; 24-hour film is recommended. 2. Stable contrast material throughout the colon, unchanged from the prior exam. Electronically signed by: Oneil Devonshire MD 12/01/2024 11:47 PM EST RP Workstation: [id number]   DG C-Arm 1-60 Min-No Report Result Date: 11/30/2024 Fluoroscopy was utilized by the requesting physician.  No  radiographic interpretation.        Scheduled Meds:  Chlorhexidine  Gluconate Cloth  6 each Topical Daily   dexamethasone  (DECADRON ) injection  2 mg Intravenous Q12H   octreotide   50 mcg Subcutaneous Q8H   mouth rinse  15 mL Mouth Rinse 4 times per day   pantoprazole  (PROTONIX ) IV  40 mg Intravenous Q12H   Continuous Infusions:  ampicillin -sulbactam (UNASYN ) IV 3 g (12/02/24 0500)   promethazine  (PHENERGAN ) injection (IM or IVPB) 12.5 mg (12/02/24 0511)     LOS: 4 days    Time spent: 51 minutes    Renato Applebaum, MD [facility]

## 2024-12-02 NOTE — Progress Notes (Addendum)
 [facility] Progress Note  CC:  Gastric cancer, nausea and vomiting   Subjective: Still had a lot of vomiting yesterday so NG tube was hooked to suction and had significant amount of return.  Felt much better overnight and slept all night.  Surgery has clamped his NG tube again this morning placed him back on clear liquids.  Octreotide  has been added to his regimen by palliative.  Received 2 smog enemas with some good return and he says that he does feel better after those as well.  Follow-up imaging/small bowel striction protocol does not show any evidence of overt significant obstruction.  He did have some red liquid concerning for blood in the NG tube canister.  Hemoglobin stable.  Objective:  Vital signs in last 24 hours: Temp:  [98 F (36.7 C)-98.5 F (36.9 C)] 98 F (36.7 C) (12/18 0514) Pulse Rate:  [67-78] 78 (12/18 0514) Resp:  [16-20] 20 (12/18 0517) BP: (131-138)/(83-90) 131/89 (12/18 0514) SpO2:  [94 %-97 %] 96 % (12/18 0514) Last BM Date : 11/26/24 (per patient) General:  Alert, Well-developed, in NAD Heart:  Regular rate and rhythm; no murmurs Pulm:  CTAB.  No W/R/R. Abdomen:  Soft, non-distended.  BS present.  Minimal TTP. Extremities:  Without edema. Neurologic:  Alert and oriented x 4;  grossly normal neurologically. Psych:  Alert and cooperative. Normal mood and affect.  Intake/Output from previous day: 12/17 0701 - 12/18 0700 In: 754.8 [NG/GT:60; IV Piggyback:694.8] Out: 1000 [Emesis/NG output:1000]  Lab Results: Recent Labs    11/30/24 0441 12/01/24 0006  WBC 4.5 4.3  HGB 11.7* 11.0*  HCT 35.8* 34.3*  PLT 217 208   BMET Recent Labs    11/30/24 0441 12/01/24 0006  NA 139 134*  K 3.5 4.2  CL 103 99  CO2 28 27  GLUCOSE 138* 105*  BUN 17 15  CREATININE 0.73 0.67  CALCIUM  8.8* 8.6*   LFT Recent Labs    12/01/24 0006  PROT 5.9*  ALBUMIN  3.0*  AST 47*  ALT 66*  ALKPHOS 99  BILITOT 0.5   DG Abd Portable 1V-Small Bowel  Obstruction Protocol-initial, 8 hr delay Result Date: 12/01/2024 EXAM: 1 VIEW XRAY OF THE ABDOMEN 12/01/2024 10:19:00 PM COMPARISON: 11/29/2024 CLINICAL HISTORY: FINDINGS: LINES, TUBES AND DEVICES: Enteric tube in place, not well visualized distally. IVC filter in place. Stent in right upper quadrant. BOWEL: Nonobstructive bowel gas pattern. Stable contrast material is noted throughout the colon, unchanged from the prior exam. Recently administered contrast lies within mildly dilated loops of small bowel. 24-hour film is recommended. SOFT TISSUES: No abnormal calcifications. BONES: No acute fracture. IMPRESSION: 1. Mildly dilated loops of small bowel with recently administered contrast; 24-hour film is recommended. 2. Stable contrast material throughout the colon, unchanged from the prior exam. Electronically signed by: Oneil Devonshire MD 12/01/2024 11:47 PM EST RP Workstation: [email or web address]   DG C-Arm 1-60 Min-No Report Result Date: 11/30/2024 Fluoroscopy was utilized by the requesting physician.  No radiographic interpretation.   Assessment / Plan: #1.  Worsening N/V - d/t ?Cisplatin  based chemo. Pt with antral stenosis, gastric stasis without mechanical outlet obstruction on recent UGI series and EGD.   EGD 12/16: - LA Grade D esophagitis throughout. - Z- line irregular. - Granular, inflamed and texture changed mucosa in the stomach - linnitus pattern. - Retained gastric fluid - suctioned via endoscope. 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 Prosthesis placed. - Duodenal dilation deformity throughout. - Retained  food in the duodenum. - NGT decompression tube placement was successfully performed.   #2. Dilated SB loops- may represent functional obs d/t known peritoneal carcinomatosis, impaired peristalsis (Malignant ileus). Barium does reach colon in 3 hrs.  Follow-up imaging not suggestive of any significant obstruction.     #3. Stage IV Gastric Adeno Ca   #4  DVT  status post IVC filter, no anticoagulation   -Will see how he does with clear liquids again today.  NGT clamped again. -Appreciate surgeries input and palliative care in assisting with symptoms management.  Will leave anti-emetic regimen to palliative care (has had some relief with Ativan  and Haldol ).  Octreotide  also ordered by palliative. -Continue pantoprazole  40 mg IV BID.     LOS: 4 days   Harlene BIRCH. Zehr  12/02/2024, 10:10 AM   Attending physician's note   I have taken history, reviewed the chart and examined the patient. I performed a substantive portion of this encounter, including complete performance of at least one of the key components, in conjunction with the APP. I agree with the Advanced Practitioner's note, impression and recommendations.   Feels much better today.  Had BM after enema NG tube clamped.  Clear liquids ordered Patient and family in better spirits today  Hopefully will be able to advance diet gradually Will follow. No good further Endo options.    Anselm Bring, MD [facility] [phone]

## 2024-12-02 NOTE — Plan of Care (Addendum)
 Pt received SMOG enema today moderate BM NGT clamped tolerating clears slowly , denies pain family remains at bedside

## 2024-12-02 NOTE — Progress Notes (Signed)
 IP PROGRESS NOTE  Subjective:   Henry May has an NG tube in place.  The NG tube is now to wall suction.  He denies nausea.  He is hungry.  He reports passing flatus.  No bowel movement. Objective: Vital signs in last 24 hours: Blood pressure (!) 130/90, pulse 74, temperature 97.7 F (36.5 C), resp. rate 18, height 6' (1.829 m), weight 154 lb 5.2 oz (70 kg), SpO2 94%.  Intake/Output from previous day: 12/17 0701 - 12/18 0700 In: 754.8 [NG/GT:60; IV Piggyback:694.8] Out: 1000 [Emesis/NG output:1000]  Physical Exam:  HEENT: No thrush, NG tube in place  Abdomen: Soft, nontender, no hepatomegaly Extremities: No leg edema   Portacath/PICC-without erythema  Lab Results: Recent Labs    11/30/24 0441 12/01/24 0006  WBC 4.5 4.3  HGB 11.7* 11.0*  HCT 35.8* 34.3*  PLT 217 208    BMET Recent Labs    11/30/24 0441 12/01/24 0006  NA 139 134*  K 3.5 4.2  CL 103 99  CO2 28 27  GLUCOSE 138* 105*  BUN 17 15  CREATININE 0.73 0.67  CALCIUM  8.8* 8.6*    Lab Results  Component Value Date   CEA 3.57 01/15/2024    Studies/Results: DG Abd Portable 1V-Small Bowel Obstruction Protocol-initial, 8 hr delay Result Date: 12/01/2024 EXAM: 1 VIEW XRAY OF THE ABDOMEN 12/01/2024 10:19:00 PM COMPARISON: 11/29/2024 CLINICAL HISTORY: FINDINGS: LINES, TUBES AND DEVICES: Enteric tube in place, not well visualized distally. IVC filter in place. Stent in right upper quadrant. BOWEL: Nonobstructive bowel gas pattern. Stable contrast material is noted throughout the colon, unchanged from the prior exam. Recently administered contrast lies within mildly dilated loops of small bowel. 24-hour film is recommended. SOFT TISSUES: No abnormal calcifications. BONES: No acute fracture. IMPRESSION: 1. Mildly dilated loops of small bowel with recently administered contrast; 24-hour film is recommended. 2. Stable contrast material throughout the colon, unchanged from the prior exam. Electronically signed by: Oneil Devonshire MD 12/01/2024 11:47 PM EST RP Workstation: [id number]    Medications: I have reviewed the patient's current medications.  Assessment/Plan: Gastric cancer 11/21/2023 EGD-gastric body with infiltrative looking lesion; biopsy shows involvement of a hypercellular lesion that suggests diffuse carcinoma by morphology CTs abdomen/pelvis 11/28/2023-diffuse fatty infiltration of the liver; gastric wall thickening; lymph nodes up to 6 mm in diameter near the stomach wall. 12/31/2023 upper endoscopy-large diffuse friable infiltrative polypoid and ulcerated circumferential mass found in the gastric body.  Scope was passed beyond the mass with normal antrum/pylorus.  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  Negative for HER2 (1+); mismatch repair protein IHC normal; PD-L1 CPS 0%; CLDN18 positive: 90% of tumor cells with 2+/3+ membrane staining PET scan 01/07/2024-circumferential hypermetabolic gastric mucosal thickening.  Ill-defined hypermetabolic lymph nodes in the gastrohepatic ligament.  Intense hypermetabolic activity associated with the right lobe of the thyroid  gland.  Small hypermetabolic right axillary node favored reactive. 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  Gastric wall thickening slightly improved. Cycle 7 oxaliplatin ,  zolbetuximab 05/05/2024, Fulphila  Cycle 8 zolbetuximab 05/20/2024, oxaliplatin  held due to  neuropathy, no Fulphila  Cycle 9 oxaliplatin /zolbetuximab 06/03/2024, Fulphila  Cycle 10 oxaliplatin /zolbetuximab 06/17/2024, Fulphila  Cycle 11 oxaliplatin /zolbetuximab 06/30/2024, Fulphila , oxaliplatin  dose reduced Cycle 12 zolbetuximab 07/15/2024, oxaliplatin  held secondary to neuropathy Cycle 13 zolbetuximab 07/29/2024, oxaliplatin  held secondary to neuropathy Cycle 14 zolbetuximab 08/26/2024, oxaliplatin  held secondary to neuropathy 09/01/2024 CTs-mild distal gastric wall thickening without discrete mass.  No metastatic disease. Cycle 15 zolbetuximab 09/09/2024, oxaliplatin  held due to neuropathy Cycle 16 zolbetuximab 09/30/2024, oxaliplatin  held due to neuropathy Cycle 17 zolbetuximab 10/21/2024, oxaliplatin  held due to neuropathy 11/03/2024 CT abdomen/pelvis: Diffuse mild wall thickening of the distal gastric body and antrum-stable, mild increase in a right gastric lymph node small volume ascites without omental nodularity, proximal jejunal mural thickening 11/04/2024 upper GI: Narrowing of the gastric antrum, normal gastric emptying, dilated duodenum and proximal jejunum without evidence of a bowel obstruction 11/18/2024 upper endoscopy: Tumor in the gastric body and antrum with antral narrowing, dilated duodenum without evidence of mass or obstruction, esophagitis: Biopsy of the gastric mass-poorly differentiated adenocarcinoma with focal signet ring morphology, esophagus biopsy: Squamous mucosa with focal ulceration Cycle 1 weekly cisplatin  11/26/2024 Early satiety, postprandial abdominal pain, weight loss secondary to #1 History of bilateral lower extremity DVT 2021-anticoagulation discontinued due to massive retroperitoneal bleed, IVC filter placed 08/24/2020 Hospitalized with acute respiratory failure due to COVID-19 08/06/2020 - 09/04/2020 History of massive retroperitoneal bleed secondary to anticoagulation  September 2021 Thyroid  ultrasound 01/13/2024-no abnormal nodule identified in the right lobe.  1.1 cm thyroid  isthmus nodule meets criteria for 1 year follow-up ultrasound. Admission 02/01/2024 with increased abdominal pain/ascites Hospitalization with altered mental status 02/05/2024 through 02/13/2024; question rare case of 5-FU psychosis.  Improved 02/16/2024.  Mental status at baseline 02/23/2024. Neutropenia 02/16/2024 Mucositis 02/16/2024.  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  Persistent cold sensitivity 05/20/2024, oxaliplatin  held.  Cold sensitivity resolved 06/03/2024, oxaliplatin  resumed.  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  There is been recent evidence of disease progression with persistent/progressive tumor in the stomach and intractable nausea/vomiting.  The omental disease on presentation last year remains significantly improved.  He responded well to initial treatment with oxaliplatin  based chemotherapy.  He has neuropathy symptoms.  He began a trial of salvage therapy with cisplatin  on 11/26/2024.  He is now admitted with intractable nausea/vomiting, potentially related to a small bowel obstruction, ileus from the carcinomatosis, in addition to persistent tumor in the stomach.  I reviewed the admission CT images.  It is possible the nausea is in part due to the low-dose cisplatin , but I think this is less  likely.    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  There was retained fluid in the stomach and duodenum.  A gastric antral stent was placed.  An NG tube was placed.  Imaging does not reveal a mechanical obstruction he is now on clear liquids.  The NG tube can be clamped as tolerated.  He will be a candidate to continue a trial of cisplatin  chemotherapy if the nausea improves and he can be discharged to home.  I think it would be reasonable to consider home  TPN if he cannot tolerate a liquid diet.  Recommendations: Continue antibiotics for the suspected aspiration pneumonia Diet, NG tube management per gastroenterology Ativan  as needed for nausea Continue octreotide  as recommended by palliative care, can switch to outpatient Sandostatin  if the octreotide  is helpful Plan to continue weekly cisplatin  chemotherapy as an outpatient Please call Oncology as needed, I will see him 12/06/2024   LOS: 4 days   Arley Hof, MD   12/02/2024, 6:47 PM

## 2024-12-03 ENCOUNTER — Other Ambulatory Visit: Payer: Self-pay

## 2024-12-03 DIAGNOSIS — K920 Hematemesis: Secondary | ICD-10-CM | POA: Diagnosis not present

## 2024-12-03 DIAGNOSIS — C163 Malignant neoplasm of pyloric antrum: Secondary | ICD-10-CM | POA: Diagnosis not present

## 2024-12-03 DIAGNOSIS — R112 Nausea with vomiting, unspecified: Secondary | ICD-10-CM | POA: Diagnosis not present

## 2024-12-03 DIAGNOSIS — K5989 Other specified functional intestinal disorders: Secondary | ICD-10-CM | POA: Diagnosis not present

## 2024-12-03 LAB — DIFFERENTIAL
Abs Immature Granulocytes: 0.05 K/uL (ref 0.00–0.07)
Basophils Absolute: 0 K/uL (ref 0.0–0.1)
Basophils Relative: 0 %
Eosinophils Absolute: 0 K/uL (ref 0.0–0.5)
Eosinophils Relative: 1 %
Immature Granulocytes: 1 %
Lymphocytes Relative: 10 %
Lymphs Abs: 0.6 K/uL — ABNORMAL LOW (ref 0.7–4.0)
Monocytes Absolute: 0.5 K/uL (ref 0.1–1.0)
Monocytes Relative: 9 %
Neutro Abs: 4.4 K/uL (ref 1.7–7.7)
Neutrophils Relative %: 79 %

## 2024-12-03 MED ORDER — KATE FARMS STANDARD 1.4 PO LIQD
325.0000 mL | Freq: Two times a day (BID) | ORAL | Status: DC
  Administered 2024-12-03: 325 mL via ORAL
  Filled 2024-12-03 (×8): qty 325

## 2024-12-03 NOTE — Progress Notes (Addendum)
 "    [facility] Progress Note  CC:  Gastric cancer, nausea and vomiting   Subjective:  Continuing to do well with NGT clamped.  Tolerating small amounts of clear liquids.   Objective:  Vital signs in last 24 hours: Temp:  [97.7 F (36.5 C)-98.3 F (36.8 C)] 98.3 F (36.8 C) (12/19 0457) Pulse Rate:  [69-87] 87 (12/19 0457) Resp:  [18] 18 (12/19 0515) BP: (125-138)/(79-90) 125/79 (12/19 0457) SpO2:  [94 %-97 %] 97 % (12/19 0457) Last BM Date : 12/02/24 General:  Alert, Well-developed,in NAD Heart:  Regular rate and rhythm; no murmurs Pulm:  CTAB.  No W/R/R. Abdomen:  Soft, non-distended.  BS present.  Non-tender. Extremities:  Without edema. Neurologic:  Alert and oriented x 4;  grossly normal neurologically. Psych:  Alert and cooperative. Normal mood and affect.  Intake/Output from previous day: 12/18 0701 - 12/19 0700 In: 623.3 [NG/GT:10; IV Piggyback:613.3] Out: -   Lab Results: Recent Labs    12/01/24 0006  WBC 4.3  HGB 11.0*  HCT 34.3*  PLT 208   BMET Recent Labs    12/01/24 0006  NA 134*  K 4.2  CL 99  CO2 27  GLUCOSE 105*  BUN 15  CREATININE 0.67  CALCIUM  8.6*   LFT Recent Labs    12/01/24 0006  PROT 5.9*  ALBUMIN  3.0*  AST 47*  ALT 66*  ALKPHOS 99  BILITOT 0.5   DG Abd Portable 1V Result Date: 12/02/2024 CLINICAL DATA:  Small bowel obstruction. EXAM: PORTABLE ABDOMEN - 1 VIEW COMPARISON:  Abdominal radiograph dated 12/01/2024. FINDINGS: Partially visualized enteric tube with tip in the proximal stomach and side port at the GE junction. Recommend further advancing by additional 7 cm. No bowel dilatation or evidence of obstruction. Retained stool mixed with contrast noted throughout the colon. No free air.An IVC filter is noted. No acute osseous pathology. IMPRESSION: 1. No bowel dilatation or evidence of obstruction. 2. Enteric tube with tip in the proximal stomach and side port at the GE junction. Recommend further advancing by  additional 7 cm. Electronically Signed   By: Vanetta Chou M.D.   On: 12/02/2024 19:37   DG Abd Portable 1V-Small Bowel Obstruction Protocol-initial, 8 hr delay Result Date: 12/01/2024 EXAM: 1 VIEW XRAY OF THE ABDOMEN 12/01/2024 10:19:00 PM COMPARISON: 11/29/2024 CLINICAL HISTORY: FINDINGS: LINES, TUBES AND DEVICES: Enteric tube in place, not well visualized distally. IVC filter in place. Stent in right upper quadrant. BOWEL: Nonobstructive bowel gas pattern. Stable contrast material is noted throughout the colon, unchanged from the prior exam. Recently administered contrast lies within mildly dilated loops of small bowel. 24-hour film is recommended. SOFT TISSUES: No abnormal calcifications. BONES: No acute fracture. IMPRESSION: 1. Mildly dilated loops of small bowel with recently administered contrast; 24-hour film is recommended. 2. Stable contrast material throughout the colon, unchanged from the prior exam. Electronically signed by: Oneil Devonshire MD 12/01/2024 11:47 PM EST RP Workstation: [id number]   Assessment / Plan: #1.  Worsening N/V - d/t ?Cisplatin  based chemo. Pt with antral stenosis, gastric stasis without mechanical outlet obstruction on recent UGI series and EGD.   EGD 12/16: - LA Grade D esophagitis throughout. - Z- line irregular. - Granular, inflamed and texture changed mucosa in the stomach - linnitus pattern. - Retained gastric fluid - suctioned via endoscope. 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 Prosthesis placed. - Duodenal dilation deformity throughout. - Retained food in the duodenum. - NGT decompression tube placement  was successfully performed.   #2. Dilated SB loops- may represent functional obs d/t known peritoneal carcinomatosis, impaired peristalsis (Malignant ileus). Barium does reach colon in 3 hrs.  Follow-up imaging not suggestive of any significant obstruction.  ? More of a motility issue.   #3. Stage IV Gastric Adeno Ca   #4   DVT status post IVC filter, no anticoagulation   -Advance diet as tolerated to full liquids when able and then no further than soft/low residue. -Appreciate surgeries input and palliative care in assisting with symptoms management.  Will leave anti-emetic regimen to palliative care (has had some relief with Ativan  and Haldol ).  Octreotide  also ordered by palliative. -Leave NGT in place and clamped for one more day and if does well then it can be removed 12/20. -Continue pantoprazole  40 mg IV BID. -Could consider dulcolax suppositories to continue to help stimulate the bowel. -Will will sign off.  Follow-up with GI only on a prn basis for now.   LOS: 5 days   Harlene BIRCH. Zehr  12/03/2024, 9:13 AM     Attending physician's note   I have taken history, reviewed the chart and examined the patient. I performed a substantive portion of this encounter, including complete performance of at least one of the key components, in conjunction with the APP. I agree with the Advanced Practitioner's note, impression and recommendations.   Stage IV gastric Ca with gastric outlet/antral obstruction S/P EGD with gastric stent (6 cm, antrum to pylorus) 12/16 Dilated SB loops s/o ileus. Likely d/t known peritoneal carcinomatosis, impaired peristalsis. Neg prev Ba SB series and Gastrografin  study this admission.  Appreciate surgical consultation.  Plan: - Advance diet to full liquids.NG tube clamped for now.  If he tolerates, D/C NG tube in AM. - Given Dulcolax suppository today.  If no response, can consider repeat enema - Continue Protonix , rest of the medications - Would consider Mote[employer]ity as outpatient - No other GI recommendations currently.  Please call us  if we could be of any further help.  Dr. Rollin covering [facility] over the weekend   Anselm Bring, MD  GI [phone]  "

## 2024-12-03 NOTE — Progress Notes (Signed)
 Nutrition Follow-up  DOCUMENTATION CODES:   Severe malnutrition in context of chronic illness  INTERVENTION:  - Full Liquid diet per GI.  [employer] 1.4 PO BID, each supplement provides 455 kcal and 20 grams protein.   NUTRITION DIAGNOSIS:   Severe Malnutrition related to chronic illness (gastric cancer) as evidenced by severe fat depletion, severe muscle depletion, percent weight loss (24% weight loss in less than 3 months). *ongoing  GOAL:   Patient will meet greater than or equal to 90% of their needs *progressing  MONITOR:   Diet advancement, Labs, Weight trends  REASON FOR ASSESSMENT:   Malnutrition Screening Tool, NPO/Clear Liquid Diet (NPO x3 days)    ASSESSMENT:   58 y.o. male with PMH of gastric cancer on chemo and recent esophagitis who presented due to nausea, vomiting and coffee-ground emesis 2 days after he started new chemotherapy. Admitted for intractable nausea and vomiting, dilated small bowel loops with concern for partial SBO, and coffee-ground emesis.  12/13 Admit; NPO  12/16 s/p upper GI endoscopy with gastric stent placement and NGT placement; CLD 12/17 NPO 12/18 CLD  12/19 FLD  Patient sleeping at time of visit. Wife and daughter at bedside.   Wife reports the patient has been doing fairly well with clear liquids. Reports he has been stating he is hungry and wanting to eat more. Vomiting much less now and has not vomited since yesterday.   GI states can advance diet to full liquids. Wife reports the patient doesn't do well with milk so agreeable to try plant based [employer] supplements.   At this time, plan to hold off on starting any TPN. Plan to monitor patient's diet tolerance/intake and nutrition status.   Admit weight: 154# Current weight: 154#  Medications reviewed and include: Protonix , Phenergan  Q6H  Labs reviewed:  Na 134  Diet Order:   Diet Order             Diet full liquid Room service appropriate? Yes; Fluid  consistency: Thin  Diet effective now                   EDUCATION NEEDS:  Education needs have been addressed  Skin:  Skin Assessment: Reviewed RN Assessment  Last BM:  12/18  Height:  Ht Readings from Last 1 Encounters:  11/30/24 6' (1.829 m)   Weight:  Wt Readings from Last 1 Encounters:  11/30/24 70 kg   Ideal Body Weight:  80.91 kg  BMI:  Body mass index is 20.93 kg/m.  Estimated Nutritional Needs:  Kcal:  2100-2450 kcals Protein:  105-120 grams Fluid:  >/= 2.1L    Trude Ned RD, LDN Contact via Secure Chat.

## 2024-12-03 NOTE — Progress Notes (Signed)
 "   3 Days Post-Op  Subjective: Had very small volume emesis after NGT clamped with chicken broth yesterday but sounds like it was almost more phlegm than anything. He had some broth at 5:30 AM today and has had no n/v with NGT clamped. Had some success after enema yesterday and denies abdominal pain.   Objective: Vital signs in last 24 hours: Temp:  [97.7 F (36.5 C)-98.3 F (36.8 C)] 98.3 F (36.8 C) (12/19 0457) Pulse Rate:  [69-87] 87 (12/19 0457) Resp:  [18] 18 (12/19 0515) BP: (125-138)/(79-90) 125/79 (12/19 0457) SpO2:  [94 %-97 %] 97 % (12/19 0457) Last BM Date : 12/02/24  Intake/Output from previous day: 12/18 0701 - 12/19 0700 In: 623.3 [NG/GT:10; IV Piggyback:613.3] Out: -  Intake/Output this shift: No intake/output data recorded.  PE: Gen: pleasant, NAD Abd: soft, not really tender, ND, NGT clamped  Lab Results:  Recent Labs    12/01/24 0006  WBC 4.3  HGB 11.0*  HCT 34.3*  PLT 208   BMET Recent Labs    12/01/24 0006  NA 134*  K 4.2  CL 99  CO2 27  GLUCOSE 105*  BUN 15  CREATININE 0.67  CALCIUM  8.6*   PT/INR No results for input(s): LABPROT, INR in the last 72 hours.  CMP     Component Value Date/Time   NA 134 (L) 12/01/2024 0006   K 4.2 12/01/2024 0006   CL 99 12/01/2024 0006   CO2 27 12/01/2024 0006   GLUCOSE 105 (H) 12/01/2024 0006   BUN 15 12/01/2024 0006   CREATININE 0.67 12/01/2024 0006   CREATININE 0.83 11/26/2024 0917   CALCIUM  8.6 (L) 12/01/2024 0006   PROT 5.9 (L) 12/01/2024 0006   ALBUMIN  3.0 (L) 12/01/2024 0006   AST 47 (H) 12/01/2024 0006   AST 25 11/09/2024 0910   ALT 66 (H) 12/01/2024 0006   ALT 37 11/09/2024 0910   ALKPHOS 99 12/01/2024 0006   BILITOT 0.5 12/01/2024 0006   BILITOT 0.4 11/09/2024 0910   GFRNONAA >60 12/01/2024 0006   GFRNONAA >60 11/26/2024 0917   GFRAA >60 09/03/2020 0836   Lipase     Component Value Date/Time   LIPASE 53 (H) 11/30/2024 0441       Studies/Results: DG Abd Portable  1V Result Date: 12/02/2024 CLINICAL DATA:  Small bowel obstruction. EXAM: PORTABLE ABDOMEN - 1 VIEW COMPARISON:  Abdominal radiograph dated 12/01/2024. FINDINGS: Partially visualized enteric tube with tip in the proximal stomach and side port at the GE junction. Recommend further advancing by additional 7 cm. No bowel dilatation or evidence of obstruction. Retained stool mixed with contrast noted throughout the colon. No free air.An IVC filter is noted. No acute osseous pathology. IMPRESSION: 1. No bowel dilatation or evidence of obstruction. 2. Enteric tube with tip in the proximal stomach and side port at the GE junction. Recommend further advancing by additional 7 cm. Electronically Signed   By: Vanetta Chou M.D.   On: 12/02/2024 19:37   DG Abd Portable 1V-Small Bowel Obstruction Protocol-initial, 8 hr delay Result Date: 12/01/2024 EXAM: 1 VIEW XRAY OF THE ABDOMEN 12/01/2024 10:19:00 PM COMPARISON: 11/29/2024 CLINICAL HISTORY: FINDINGS: LINES, TUBES AND DEVICES: Enteric tube in place, not well visualized distally. IVC filter in place. Stent in right upper quadrant. BOWEL: Nonobstructive bowel gas pattern. Stable contrast material is noted throughout the colon, unchanged from the prior exam. Recently administered contrast lies within mildly dilated loops of small bowel. 24-hour film is recommended. SOFT TISSUES: No abnormal calcifications.  BONES: No acute fracture. IMPRESSION: 1. Mildly dilated loops of small bowel with recently administered contrast; 24-hour film is recommended. 2. Stable contrast material throughout the colon, unchanged from the prior exam. Electronically signed by: Oneil Devonshire MD 12/01/2024 11:47 PM EST RP Workstation: [id number]    Anti-infectives: Anti-infectives (From admission, onward)    Start     Dose/Rate Route Frequency Ordered Stop   11/28/24 1200  Ampicillin -Sulbactam (UNASYN ) 3 g in sodium chloride  0.9 % 100 mL IVPB        3 g 200 mL/hr over 30 Minutes Intravenous  Every 6 hours 11/28/24 1048     11/28/24 0045  Ampicillin -Sulbactam (UNASYN ) 3 g in sodium chloride  0.9 % 100 mL IVPB        3 g 200 mL/hr over 30 Minutes Intravenous  Once 11/28/24 0042 11/28/24 0134        Assessment/Plan Stage IV gastric cancer on cisplatin   PSBO vs ileus  - EGD with stent placement  - SBO protocol with a nonobstructive bowel gas pattern.  Contrast in colon appears old from UGI in November, but oral contrast is in the small bowel and distal past area of concern on CT that was in proximal jejunum. - continue to suspect his issue is likely more a motility one as opposed to a true obstruction.  He could have a partial, but his x-ray looks good - at this time NGT remains clamped and he is on CLD - I will defer decision on removal and diet advancement to GI since this does appear to be more of a motility issue - family seems to prefer leaving clamped for another day - no indications for surgery, but not sure that he would be a surgical candidate if there was an indication. - general surgery is always available but will sign off at this time   FEN - NGT clamp/trial of clears/IVFs VTE - SCDs ID - [email or web address]  for aspiration PNA  I reviewed Consultant GI, palliative, ONC notes, hospitalist notes, last 24 h vitals and pain scores, last 48 h intake and output, last 24 h labs and trends, and last 24 h imaging results.   LOS: 5 days    Burnard JONELLE Louder , [facility] 12/03/2024, 9:29 AM Please see Amion for pager number during day hours 7:00am-4:30pm or 7:00am -11:30am on weekends  "

## 2024-12-03 NOTE — Progress Notes (Signed)
 " PROGRESS NOTE    Henry May  FMW:[id number] DOB: 07-05-1966 DOA: 11/27/2024 PCP: [name], DO    Brief Narrative:  58 year old M with PMH of DVT with IVC filter, gastric cancer on chemo and esophagitis presented to [facility] due to nausea, vomiting and coffee-ground emesis 2 days after he was started new chemotherapy, and admitted for intractable nausea and vomiting, dilated small bowel loops and coffee-ground emesis.  Recent EGD on 12/4 with LA grade D esophagitis without bleeding, and large infiltrative, sessile and ulcerated circumferential mass with contact of bleeding in gastric body and antrum with antral narrowing and luminal diameter of approximately 1.5 cm.  In ED, stable vitals.  CBC and CMP without significant finding.  Lipase 171.  CT abdomen and pelvis showed dilated proximal small bowel suggesting SBO but without discrete transition point, thick walled gastric body/antrum without definite focal mass, mild nodularity along the pylorus, scarring with superimposed patchy peribronchovascular nodularity in the bilateral lower lobes right more than left suggesting mild aspiration or pneumonia.  Patient was started on antiemetics, IV fluid, PPI and admitted for further care.  Oncology, surgery, GI and palliative medicine consulted. 12/16, underwent upper GI endoscopy and stenting of the gastric narrowing.  Subjective:  Patient seen and examined.  Tolerating liquids.  He had more of mucus and secretions coughed out yesterday evening.  Tolerated chicken broth today.  Had bowel movement and passing flatus after the enema. Denies any pain.  Wife at the bedside.  He can likely go on full liquid diet, will defer to surgery and GI.    Assessment & Plan:   Intractable nausea and vomiting related to gastric cancer, gastric luminal narrowing and esophagitis: Remains on symptomatic management.  May advance to full liquid diet today.  On IV Protonix . Scheduled Phenergan  and as  needed Ativan  Dexamethasone  as per palliative care Upper GI endoscopy and stenting of gastric stenosis-NG tube decompression. Hemoglobin has remained stable.  Currently no need for transfusion. Patient with advanced gastric cancer, followed by oncology.   Hopefully patient can tolerate liquid diet and soft food to discharge home next 48 to 72 hours.  Possible aspiration pneumonitis and pneumonia: Chest x-ray with right middle lobe opacity.  Bibasilar infiltrates.  On Unasyn .  Will continue for 7 days of therapy.  Can change to oral if able to take by mouth.  Type 2 diabetes, diet controlled: On SSI while on steroids.  History of DVT: Cannot be anticoagulated.  Status post IVC filter.  Goal of care: Aggressive gastric cancer with poor prognosis.  GI/oncology and palliative following.  Currently desires full code and chemotherapy. Patient and family desires TPN, oncology desires TPN.  If patient can tolerate liquid diet, will discharge home and can be arranged TPN through cancer center.  TPN does have inherent risk of infection and other complications.    DVT prophylaxis: SCDs Start: 11/28/24 1042   Code Status: Full code Family Communication: Wife at the bedside. Disposition Plan: Status is: Inpatient Remains inpatient appropriate because: Unable to eat     Consultants:  GI Palliative Oncology  Procedures:  Upper GI endoscopy and stenting of the gastric narrowing  Antimicrobials:  Unasyn      Objective: Vitals:   12/02/24 1301 12/02/24 1948 12/03/24 0457 12/03/24 0515  BP: (!) 130/90 138/82 125/79   Pulse: 74 69 87   Resp: 18   18  Temp: 97.7 F (36.5 C) 97.8 F (36.6 C) 98.3 F (36.8 C)   TempSrc:  Oral Oral  SpO2: 94% 95% 97%   Weight:      Height:        Intake/Output Summary (Last 24 hours) at 12/03/2024 1016 Last data filed at 12/03/2024 0514 Gross per 24 hour  Intake 613.33 ml  Output --  Net 613.33 ml   Filed Weights   11/29/24 0511 11/30/24 1437   Weight: 70 kg 70 kg    Examination:  General exam: Frail gentleman.  Fairly comfortable today. Respiratory system: Clear to auscultation. Respiratory effort normal.  No added sounds. Right chest wall Port-A-Cath present. Cardiovascular system: S1 & S2 heard, RRR. No JVD, murmurs, rubs, gallops or clicks. No pedal edema. Gastrointestinal system: No distention, mild tenderness on deep palpation.  Bowel sounds present. NG tube clamped. Central nervous system: Alert and oriented. No focal neurological deficits. Extremities: Symmetric 5 x 5 power.   Data Reviewed: I have personally reviewed following labs and imaging studies  CBC: Recent Labs  Lab 11/27/24 1939 11/28/24 1536 11/29/24 0250 11/30/24 0441 12/01/24 0006  WBC 7.1 4.9 4.7 4.5 4.3  HGB 14.5 13.3 12.7* 11.7* 11.0*  HCT 41.5 39.6 38.4* 35.8* 34.3*  MCV 87.9 90.4 90.6 92.0 92.2  PLT 274 239 238 217 208   Basic Metabolic Panel: Recent Labs  Lab 11/27/24 1939 11/28/24 1536 11/29/24 0250 11/30/24 0441 12/01/24 0006  NA 135 138 138 139 134*  K 4.0 3.8 3.7 3.5 4.2  CL 97* 102 102 103 99  CO2 25 25 26 28 27   GLUCOSE 122* 119* 138* 138* 105*  BUN 29* 26* 23* 17 15  CREATININE 0.83 0.87 0.85 0.73 0.67  CALCIUM  10.0 9.2 9.0 8.8* 8.6*  MG  --  2.4  --  2.0 2.0   GFR: Estimated Creatinine Clearance: 97.2 mL/min (by C-G formula based on SCr of 0.67 mg/dL). Liver Function Tests: Recent Labs  Lab 11/27/24 1939 11/28/24 1536 11/29/24 0250 11/30/24 0441 12/01/24 0006  AST 25 21 18 17  47*  ALT 62* 51* 45* 35 66*  ALKPHOS 101 82 77 77 99  BILITOT 0.6 0.4 0.4 0.4 0.5  PROT 7.7 6.8 6.6 6.1* 5.9*  ALBUMIN  4.0 3.6 3.4* 3.1* 3.0*   Recent Labs  Lab 11/27/24 1939 11/30/24 0441  LIPASE 171* 53*   No results for input(s): AMMONIA in the last 168 hours. Coagulation Profile: Recent Labs  Lab 11/27/24 1939  INR 1.1   Cardiac Enzymes: No results for input(s): CKTOTAL, CKMB, CKMBINDEX, TROPONINI in the  last 168 hours. BNP (last 3 results) No results for input(s): PROBNP in the last 8760 hours. HbA1C: No results for input(s): HGBA1C in the last 72 hours.  CBG: No results for input(s): GLUCAP in the last 168 hours. Lipid Profile: No results for input(s): CHOL, HDL, LDLCALC, TRIG, CHOLHDL, LDLDIRECT in the last 72 hours. Thyroid  Function Tests: No results for input(s): TSH, T4TOTAL, FREET4, T3FREE, THYROIDAB in the last 72 hours. Anemia Panel: No results for input(s): VITAMINB12, FOLATE, FERRITIN, TIBC, IRON, RETICCTPCT in the last 72 hours. Sepsis Labs: No results for input(s): PROCALCITON, LATICACIDVEN in the last 168 hours.  No results found for this or any previous visit (from the past 240 hours).       Radiology Studies: DG Abd Portable 1V Result Date: 12/02/2024 CLINICAL DATA:  Small bowel obstruction. EXAM: PORTABLE ABDOMEN - 1 VIEW COMPARISON:  Abdominal radiograph dated 12/01/2024. FINDINGS: Partially visualized enteric tube with tip in the proximal stomach and side port at the GE junction. Recommend further advancing by additional 7 cm.  No bowel dilatation or evidence of obstruction. Retained stool mixed with contrast noted throughout the colon. No free air.An IVC filter is noted. No acute osseous pathology. IMPRESSION: 1. No bowel dilatation or evidence of obstruction. 2. Enteric tube with tip in the proximal stomach and side port at the GE junction. Recommend further advancing by additional 7 cm. Electronically Signed   By: Vanetta Chou M.D.   On: 12/02/2024 19:37   DG Abd Portable 1V-Small Bowel Obstruction Protocol-initial, 8 hr delay Result Date: 12/01/2024 EXAM: 1 VIEW XRAY OF THE ABDOMEN 12/01/2024 10:19:00 PM COMPARISON: 11/29/2024 CLINICAL HISTORY: FINDINGS: LINES, TUBES AND DEVICES: Enteric tube in place, not well visualized distally. IVC filter in place. Stent in right upper quadrant. BOWEL: Nonobstructive bowel gas  pattern. Stable contrast material is noted throughout the colon, unchanged from the prior exam. Recently administered contrast lies within mildly dilated loops of small bowel. 24-hour film is recommended. SOFT TISSUES: No abnormal calcifications. BONES: No acute fracture. IMPRESSION: 1. Mildly dilated loops of small bowel with recently administered contrast; 24-hour film is recommended. 2. Stable contrast material throughout the colon, unchanged from the prior exam. Electronically signed by: Oneil Devonshire MD 12/01/2024 11:47 PM EST RP Workstation: [id number]        Scheduled Meds:  Chlorhexidine  Gluconate Cloth  6 each Topical Daily   dexamethasone  (DECADRON ) injection  2 mg Intravenous Q12H   feeding supplement  1 Container Oral TID BM   octreotide   50 mcg Subcutaneous Q8H   mouth rinse  15 mL Mouth Rinse 4 times per day   pantoprazole  (PROTONIX ) IV  40 mg Intravenous Q12H   Continuous Infusions:  ampicillin -sulbactam (UNASYN ) IV 3 g (12/03/24 0458)   promethazine  (PHENERGAN ) injection (IM or IVPB) 12.5 mg (12/03/24 0514)     LOS: 5 days    Time spent: 51 minutes    Renato Applebaum, MD [facility]   "

## 2024-12-04 DIAGNOSIS — K920 Hematemesis: Secondary | ICD-10-CM | POA: Diagnosis not present

## 2024-12-04 LAB — COMPREHENSIVE METABOLIC PANEL WITH GFR
ALT: 275 U/L — ABNORMAL HIGH (ref 0–44)
AST: 111 U/L — ABNORMAL HIGH (ref 15–41)
Albumin: 3.5 g/dL (ref 3.5–5.0)
Alkaline Phosphatase: 172 U/L — ABNORMAL HIGH (ref 38–126)
Anion gap: 13 (ref 5–15)
BUN: 22 mg/dL — ABNORMAL HIGH (ref 6–20)
CO2: 26 mmol/L (ref 22–32)
Calcium: 8.7 mg/dL — ABNORMAL LOW (ref 8.9–10.3)
Chloride: 95 mmol/L — ABNORMAL LOW (ref 98–111)
Creatinine, Ser: 0.92 mg/dL (ref 0.61–1.24)
GFR, Estimated: >60 mL/min
Glucose, Bld: 95 mg/dL (ref 70–99)
Potassium: 4.3 mmol/L (ref 3.5–5.1)
Sodium: 134 mmol/L — ABNORMAL LOW (ref 135–145)
Total Bilirubin: 0.9 mg/dL (ref 0.0–1.2)
Total Protein: 6.3 g/dL — ABNORMAL LOW (ref 6.5–8.1)

## 2024-12-04 LAB — MAGNESIUM: Magnesium: 2.2 mg/dL (ref 1.7–2.4)

## 2024-12-04 LAB — PHOSPHORUS: Phosphorus: 2.7 mg/dL (ref 2.5–4.6)

## 2024-12-04 MED ORDER — DIPHENHYDRAMINE HCL 50 MG/ML IJ SOLN
25.0000 mg | Freq: Once | INTRAMUSCULAR | Status: AC
  Administered 2024-12-04: 25 mg via INTRAVENOUS
  Filled 2024-12-04: qty 1

## 2024-12-04 MED ORDER — BISACODYL 10 MG RE SUPP
10.0000 mg | Freq: Every day | RECTAL | Status: DC
  Administered 2024-12-04 – 2024-12-05 (×2): 10 mg via RECTAL
  Filled 2024-12-04 (×2): qty 1

## 2024-12-04 MED ORDER — TRAVASOL 10 % IV SOLN
INTRAVENOUS | Status: AC
  Filled 2024-12-04: qty 480

## 2024-12-04 NOTE — Progress Notes (Addendum)
 Patient has an order for Dulcolax suppository and CHG. Patient encouraged to take x3. He has requested to postpone administration.

## 2024-12-04 NOTE — Progress Notes (Signed)
 " PROGRESS NOTE    Henry May  FMW:[id number] DOB: [birth year] DOA: 11/27/2024 PCP: [name], DO    Brief Narrative:  58 year old M with PMH of DVT with IVC filter, gastric cancer on chemo and esophagitis presented to [facility] ED due to nausea, vomiting and coffee-ground emesis 2 days after he was started new chemotherapy, and admitted for intractable nausea and vomiting, dilated small bowel loops and coffee-ground emesis.  Recent EGD on 12/4 with LA grade D esophagitis without bleeding, and large infiltrative, sessile and ulcerated circumferential mass with contact of bleeding in gastric body and antrum with antral narrowing and luminal diameter of approximately 1.5 cm.  In ED, stable vitals.  CBC and CMP without significant finding.  Lipase 171.  CT abdomen and pelvis showed dilated proximal small bowel suggesting SBO but without discrete transition point, thick walled gastric body/antrum without definite focal mass, mild nodularity along the pylorus, scarring with superimposed patchy peribronchovascular nodularity in the bilateral lower lobes right more than left suggesting mild aspiration or pneumonia.  Patient was started on antiemetics, IV fluid, PPI and admitted for further care.  Oncology, surgery, GI and palliative medicine consulted. 12/16, underwent upper GI endoscopy and stenting of the gastric narrowing.  Subjective:  Patient seen and examined.  He had vomited x 3 yesterday.  Could not tolerate chocolate milk, subsequently chicken broth.  Overnight felt nauseated, nursing staff were not able to hook up his NG tube to the wall.  Passing flatus.  No bowel movements.  Patient is not tolerating any oral intake. Keep on clear liquids-more for comfort. Start TPN-Will need a PICC line as port cannot be used for TPN.  Can start peripheral TPN today. Dulcolax suppository every day. Keep NG tube-drain to gravity.    Assessment & Plan:   Intractable nausea and vomiting related  to gastric cancer, gastric luminal narrowing and esophagitis: Remains symptomatic.  Unable to tolerate advancement of diet.  Unable to tolerate even clear liquids. Encourage clear liquids and mobility more for comfort. NG tube to gravity drainage.  On IV Protonix . Scheduled Phenergan  and as needed Ativan  Dexamethasone  as per palliative care Upper GI endoscopy and stenting of gastric stenosis-NG tube decompression. Hemoglobin has remained stable.  Currently no need for transfusion. Patient with advanced gastric cancer, followed by oncology.   Not showing any evidence of tolerating diet, will start on TPN.  May need a PICC line.  Will discuss with oncology.  Possible aspiration pneumonitis and pneumonia: Chest x-ray with right middle lobe opacity.  Bibasilar infiltrates.  On Unasyn .  Will continue for 7 days of therapy.  Can change to oral if able to take by mouth.  Type 2 diabetes, diet controlled: On SSI while on steroids.  History of DVT: Cannot be anticoagulated.  Status post IVC filter.  Goal of care: Aggressive gastric cancer with poor prognosis.  GI/oncology and palliative following.  Currently desires full code and chemotherapy. Will start planning for TPN to be used in the hospital and at home.    DVT prophylaxis: SCDs Start: 11/28/24 1042   Code Status: Full code Family Communication: Wife and daughter at the bedside. Disposition Plan: Status is: Inpatient Remains inpatient appropriate because: Unable to eat     Consultants:  GI Palliative Oncology  Procedures:  Upper GI endoscopy and stenting of the gastric narrowing  Antimicrobials:  Unasyn      Objective: Vitals:   12/03/24 2141 12/03/24 2300 12/04/24 0537 12/04/24 1034  BP: 133/88  136/89 (!) 138/92  Pulse: 87  87 85  Resp: 19 16 18 20   Temp: 97.8 F (36.6 C)  98 F (36.7 C) 97.9 F (36.6 C)  TempSrc:   Oral Oral  SpO2: 94%  97% 94%  Weight:      Height:        Intake/Output Summary (Last 24  hours) at 12/04/2024 1116 Last data filed at 12/04/2024 0300 Gross per 24 hour  Intake 665.86 ml  Output 30 ml  Net 635.86 ml   Filed Weights   11/29/24 0511 11/30/24 1437  Weight: 70 kg 70 kg    Examination:  General exam: Slightly anxious today.  Pleasant to conversation. Respiratory system: Clear to auscultation. Respiratory effort normal.  No added sounds. Right chest wall Port-A-Cath present. Cardiovascular system: S1 & S2 heard, RRR. No JVD, murmurs, rubs, gallops or clicks. No pedal edema. Gastrointestinal system: No distention, soft and nontender.  Bowel sounds present. NG tube clamped. Central nervous system: Alert and oriented. No focal neurological deficits. Extremities: Symmetric 5 x 5 power.   Data Reviewed: I have personally reviewed following labs and imaging studies  CBC: Recent Labs  Lab 11/27/24 1939 11/28/24 1536 11/29/24 0250 11/30/24 0441 12/01/24 0006 12/02/24 1136  WBC 7.1 4.9 4.7 4.5 4.3  --   NEUTROABS  --   --   --   --   --  4.4  HGB 14.5 13.3 12.7* 11.7* 11.0*  --   HCT 41.5 39.6 38.4* 35.8* 34.3*  --   MCV 87.9 90.4 90.6 92.0 92.2  --   PLT 274 239 238 217 208  --    Basic Metabolic Panel: Recent Labs  Lab 11/27/24 1939 11/28/24 1536 11/29/24 0250 11/30/24 0441 12/01/24 0006  NA 135 138 138 139 134*  K 4.0 3.8 3.7 3.5 4.2  CL 97* 102 102 103 99  CO2 25 25 26 28 27   GLUCOSE 122* 119* 138* 138* 105*  BUN 29* 26* 23* 17 15  CREATININE 0.83 0.87 0.85 0.73 0.67  CALCIUM  10.0 9.2 9.0 8.8* 8.6*  MG  --  2.4  --  2.0 2.0   GFR: Estimated Creatinine Clearance: 97.2 mL/min (by C-G formula based on SCr of 0.67 mg/dL). Liver Function Tests: Recent Labs  Lab 11/27/24 1939 11/28/24 1536 11/29/24 0250 11/30/24 0441 12/01/24 0006  AST 25 21 18 17  47*  ALT 62* 51* 45* 35 66*  ALKPHOS 101 82 77 77 99  BILITOT 0.6 0.4 0.4 0.4 0.5  PROT 7.7 6.8 6.6 6.1* 5.9*  ALBUMIN  4.0 3.6 3.4* 3.1* 3.0*   Recent Labs  Lab 11/27/24 1939  11/30/24 0441  LIPASE 171* 53*   No results for input(s): AMMONIA in the last 168 hours. Coagulation Profile: Recent Labs  Lab 11/27/24 1939  INR 1.1   Cardiac Enzymes: No results for input(s): CKTOTAL, CKMB, CKMBINDEX, TROPONINI in the last 168 hours. BNP (last 3 results) No results for input(s): PROBNP in the last 8760 hours. HbA1C: No results for input(s): HGBA1C in the last 72 hours.  CBG: No results for input(s): GLUCAP in the last 168 hours. Lipid Profile: No results for input(s): CHOL, HDL, LDLCALC, TRIG, CHOLHDL, LDLDIRECT in the last 72 hours. Thyroid  Function Tests: No results for input(s): TSH, T4TOTAL, FREET4, T3FREE, THYROIDAB in the last 72 hours. Anemia Panel: No results for input(s): VITAMINB12, FOLATE, FERRITIN, TIBC, IRON, RETICCTPCT in the last 72 hours. Sepsis Labs: No results for input(s): PROCALCITON, LATICACIDVEN in the last 168 hours.  No results found for this or any  previous visit (from the past 240 hours).       Radiology Studies: No results found.       Scheduled Meds:  bisacodyl   10 mg Rectal Q0600   Chlorhexidine  Gluconate Cloth  6 each Topical Daily   dexamethasone  (DECADRON ) injection  2 mg Intravenous Q12H   feeding supplement ([employer] STANDARD 1.4) Liquid  325 mL Oral BID BM   octreotide   50 mcg Subcutaneous Q8H   mouth rinse  15 mL Mouth Rinse 4 times per day   pantoprazole  (PROTONIX ) IV  40 mg Intravenous Q12H   Continuous Infusions:  ampicillin -sulbactam (UNASYN ) IV 3 g (12/04/24 1037)   promethazine  (PHENERGAN ) injection (IM or IVPB) 12.5 mg (12/04/24 1059)     LOS: 6 days    Time spent: 51 minutes    Renato Applebaum, MD [facility]   "

## 2024-12-04 NOTE — Progress Notes (Signed)
 PHARMACY - TOTAL PARENTERAL NUTRITION CONSULT NOTE   Indication: intolerance to enteral feeds  Patient Measurements: Height: 6' (182.9 cm) Weight: 70 kg (154 lb 5.2 oz) IBW/kg (Calculated) : 77.6 TPN AdjBW (KG): 70 Body mass index is 20.93 kg/m. Usual Weight:   Assessment: 58 yoM admitted on 12/13 with intractable nausea and vomiting, dilated small bowel loops, and coffee-ground emesis. 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  He underwent EGD 12/16 showing esophagitis,gastric stensosis,  stent placed, impaired peristalsis.  Trial NG tube clamping and CLD. Pharmacy is consulted to dose TPN on 12/20 for intolerance to enteral feeding.    Glucose / Insulin : No hx DM, no PTA meds.  Most recent glucose WNL.  Goal 140-180. - Note Dexamethasone  2mg  IV q12h Electrolytes: Last labs on 12/17 (Na low, others WNL), 12/20 labs pending.   - Goal K > 4 and Mag >2 for motility Renal: SCr < 1, BUN WNL Hepatic: AST / ALT mildly elevated at 47/66, Tbili and alk phos WNL.  Albumin  3 Intake / Output; MIVF:  mIVF: none Output: Urine x2, Emesis x3 GI Imaging: 12/18 Abd xray: No bowel dilatation or evidence of obstruction. GI Surgeries / Procedures:  12/16 EGD GI meds:  Octreotide  50 mcg q8h, Protonix  40mg  q12h, Promethazine  12.5mg  q6h  Central access: Implanted Port (01/22/24) TPN start date:  12/20   Nutritional Goals: Goal TPN rate is 90 mL/hr and provides 108 g of protein and 2182 kcals per day)  RD Assessment: Estimated Needs Total Energy Estimated Needs: 2100-2450 kcals Total Protein Estimated Needs: 105-120 grams Total Fluid Estimated Needs: >/= 2.1L  Current Nutrition:  Diet: Clear liquids Tube feeds: Mallie Pinion  Plan:  Start TPN at 40 mL/hr at 1800 Electrolytes in TPN: Na 62mEq/L, K 50mEq/L, Ca 72mEq/L, Mg 76mEq/L, and Phos 15mmol/L. Cl:Ac 1:1 Add standard MVI and trace elements to TPN Initiate Sensitive q6h SSI and adjust as needed  Monitor TPN labs on Mon/Thurs,  and daily x3 days   Wanda Hasting PharmD, BCPS [facility] main pharmacy [phone] 12/04/2024 10:35 AM

## 2024-12-04 NOTE — Progress Notes (Signed)
 Provider made aware of NG tube displacement, provider is OK with tube being out at this time if patient remains without nausea. Will continue to monitor closely and reach back out to provider if nausea or vomiting develops.

## 2024-12-04 NOTE — Plan of Care (Signed)
   Problem: Nutrition: Goal: Adequate nutrition will be maintained Outcome: Progressing

## 2024-12-04 NOTE — Progress Notes (Addendum)
 Patient request RN for assistance. On assessment NG tube not in place and laying on the floor. Hospitalist provider messaged.

## 2024-12-05 DIAGNOSIS — K920 Hematemesis: Secondary | ICD-10-CM | POA: Diagnosis not present

## 2024-12-05 LAB — GLUCOSE, CAPILLARY
Glucose-Capillary: 145 mg/dL — ABNORMAL HIGH (ref 70–99)
Glucose-Capillary: 174 mg/dL — ABNORMAL HIGH (ref 70–99)

## 2024-12-05 MED ORDER — TRAVASOL 10 % IV SOLN
INTRAVENOUS | Status: AC
  Filled 2024-12-05: qty 780

## 2024-12-05 MED ORDER — INSULIN ASPART 100 UNIT/ML IJ SOLN
0.0000 [IU] | Freq: Four times a day (QID) | INTRAMUSCULAR | Status: DC
  Administered 2024-12-05 (×2): 2 [IU] via SUBCUTANEOUS
  Administered 2024-12-06 – 2024-12-07 (×6): 1 [IU] via SUBCUTANEOUS
  Filled 2024-12-05: qty 3
  Filled 2024-12-05: qty 1
  Filled 2024-12-05: qty 2
  Filled 2024-12-05 (×5): qty 1

## 2024-12-05 MED ORDER — ALUM & MAG HYDROXIDE-SIMETH 200-200-20 MG/5ML PO SUSP
30.0000 mL | ORAL | Status: DC | PRN
  Administered 2024-12-05 – 2024-12-27 (×3): 30 mL via ORAL
  Filled 2024-12-05 (×3): qty 30

## 2024-12-05 NOTE — Progress Notes (Signed)
 " PROGRESS NOTE    Henry May  FMW:[id number] DOB: 1966/04/23 DOA: 11/27/2024 PCP: [name], DO    Brief Narrative:  58 year old M with PMH of DVT with IVC filter, gastric cancer on chemo and esophagitis presented to [facility] due to nausea, vomiting and coffee-ground emesis 2 days after he was started new chemotherapy, and admitted for intractable nausea and vomiting, dilated small bowel loops and coffee-ground emesis.  Recent EGD on 12/4 with LA grade D esophagitis without bleeding, and large infiltrative, sessile and ulcerated circumferential mass with contact of bleeding in gastric body and antrum with antral narrowing and luminal diameter of approximately 1.5 cm.  In ED, stable vitals.  CBC and CMP without significant finding.  Lipase 171.  CT abdomen and pelvis showed dilated proximal small bowel suggesting SBO but without discrete transition point, thick walled gastric body/antrum without definite focal mass, mild nodularity along the pylorus, scarring with superimposed patchy peribronchovascular nodularity in the bilateral lower lobes right more than left suggesting mild aspiration or pneumonia.  Patient was started on antiemetics, IV fluid, PPI and admitted for further care.  Oncology, surgery, GI and palliative medicine consulted. 12/16, underwent upper GI endoscopy and stenting of the gastric narrowing.  Subjective:  Patient seen and examined.  Daughter at the bedside. NG tube fell off yesterday evening, as she was not actively vomiting we will did not reinsert it. Patient took some chicken soup about 7 PM-vomited large amount at about 9:30 PM.  Looks anxious.  Having bowel movements mostly small volume loose bowel movements. Tolerating TPN.  Patient is not tolerating clear liquids, advised to stay on sips just for comfort. Appropriately asked about the chemotherapy, oncologist to discuss with him tomorrow.  I am not sure he can get chemotherapy with unresolved nausea and  vomiting.    Assessment & Plan:   Intractable nausea and vomiting related to gastric cancer, gastric luminal narrowing and esophagitis: Remains symptomatic.  Unable to tolerate even clear liquids.   Encourage liquids/sips of water  just for comfort. Holding off on reinserting NG tube yet. On IV Protonix . Scheduled Phenergan  and as needed Ativan  Dexamethasone  as per palliative care Upper GI endoscopy and stenting of gastric stenosis-NG tube decompression. Hemoglobin has remained stable.  Currently no need for transfusion. Patient with advanced gastric cancer, followed by oncology.   Not showing any evidence of tolerating diet, started on TPN.  may need a PICC line.  Will discuss with oncology.  Possible aspiration pneumonitis and pneumonia: Chest x-ray with right middle lobe opacity.  Bibasilar infiltrates.  Completing 7 days of therapy today.   Type 2 diabetes, diet controlled: On SSI while on steroids.  History of DVT: Cannot be anticoagulated.  Status post IVC filter.  Goal of care: Aggressive gastric cancer with poor prognosis.  GI/oncology and palliative following.  Currently desires full code and chemotherapy. Will need TPN to go home.    DVT prophylaxis: SCDs Start: 11/28/24 1042   Code Status: Full code Family Communication: Daughter at the bedside. Disposition Plan: Status is: Inpatient Remains inpatient appropriate because: Unable to eat     Consultants:  GI Palliative Oncology  Procedures:  Upper GI endoscopy and stenting of the gastric narrowing  Antimicrobials:  Unasyn      Objective: Vitals:   12/04/24 1034 12/04/24 1306 12/04/24 2223 12/05/24 0342  BP: (!) 138/92 131/87 121/72 124/81  Pulse: 85 76 67 72  Resp: 20 16 18 19   Temp: 97.9 F (36.6 C) 97.8 F (36.6 C) 98.9 F (  37.2 C) 98.5 F (36.9 C)  TempSrc: Oral Oral Oral Oral  SpO2: 94% 97% 97% 93%  Weight:      Height:        Intake/Output Summary (Last 24 hours) at 12/05/2024  1106 Last data filed at 12/05/2024 0500 Gross per 24 hour  Intake 1257.1 ml  Output --  Net 1257.1 ml   Filed Weights   11/29/24 0511 11/30/24 1437  Weight: 70 kg 70 kg    Examination:  General exam: Looks comfortable.  Anxious.  Frail. Respiratory system: Clear to auscultation. Respiratory effort normal.  No added sounds.  On room air. Right chest wall Port-A-Cath present. Cardiovascular system: S1 & S2 heard, RRR. No JVD, murmurs, rubs, gallops or clicks. No pedal edema. Gastrointestinal system: No distention, soft and nontender.  Bowel sounds present. Central nervous system: Alert and oriented. No focal neurological deficits. Extremities: Symmetric 5 x 5 power.   Data Reviewed: I have personally reviewed following labs and imaging studies  CBC: Recent Labs  Lab 11/28/24 1536 11/29/24 0250 11/30/24 0441 12/01/24 0006 12/02/24 1136  WBC 4.9 4.7 4.5 4.3  --   NEUTROABS  --   --   --   --  4.4  HGB 13.3 12.7* 11.7* 11.0*  --   HCT 39.6 38.4* 35.8* 34.3*  --   MCV 90.4 90.6 92.0 92.2  --   PLT 239 238 217 208  --    Basic Metabolic Panel: Recent Labs  Lab 11/28/24 1536 11/29/24 0250 11/30/24 0441 12/01/24 0006 12/04/24 1102  NA 138 138 139 134* 134*  K 3.8 3.7 3.5 4.2 4.3  CL 102 102 103 99 95*  CO2 25 26 28 27 26   GLUCOSE 119* 138* 138* 105* 95  BUN 26* 23* 17 15 22*  CREATININE 0.87 0.85 0.73 0.67 0.92  CALCIUM  9.2 9.0 8.8* 8.6* 8.7*  MG 2.4  --  2.0 2.0 2.2  PHOS  --   --   --   --  2.7   GFR: Estimated Creatinine Clearance: 84.5 mL/min (by C-G formula based on SCr of 0.92 mg/dL). Liver Function Tests: Recent Labs  Lab 11/28/24 1536 11/29/24 0250 11/30/24 0441 12/01/24 0006 12/04/24 1102  AST 21 18 17  47* 111*  ALT 51* 45* 35 66* 275*  ALKPHOS 82 77 77 99 172*  BILITOT 0.4 0.4 0.4 0.5 0.9  PROT 6.8 6.6 6.1* 5.9* 6.3*  ALBUMIN  3.6 3.4* 3.1* 3.0* 3.5   Recent Labs  Lab 11/30/24 0441  LIPASE 53*   No results for input(s): AMMONIA in the  last 168 hours. Coagulation Profile: No results for input(s): INR, PROTIME in the last 168 hours.  Cardiac Enzymes: No results for input(s): CKTOTAL, CKMB, CKMBINDEX, TROPONINI in the last 168 hours. BNP (last 3 results) No results for input(s): PROBNP in the last 8760 hours. HbA1C: No results for input(s): HGBA1C in the last 72 hours.  CBG: No results for input(s): GLUCAP in the last 168 hours. Lipid Profile: No results for input(s): CHOL, HDL, LDLCALC, TRIG, CHOLHDL, LDLDIRECT in the last 72 hours. Thyroid  Function Tests: No results for input(s): TSH, T4TOTAL, FREET4, T3FREE, THYROIDAB in the last 72 hours. Anemia Panel: No results for input(s): VITAMINB12, FOLATE, FERRITIN, TIBC, IRON, RETICCTPCT in the last 72 hours. Sepsis Labs: No results for input(s): PROCALCITON, LATICACIDVEN in the last 168 hours.  No results found for this or any previous visit (from the past 240 hours).       Radiology Studies: No results  found.       Scheduled Meds:  Chlorhexidine  Gluconate Cloth  6 each Topical Daily   dexamethasone  (DECADRON ) injection  2 mg Intravenous Q12H   feeding supplement (KATE FARMS STANDARD 1.4) Liquid  325 mL Oral BID BM   insulin  aspart  0-9 Units Subcutaneous Q6H   octreotide   50 mcg Subcutaneous Q8H   mouth rinse  15 mL Mouth Rinse 4 times per day   pantoprazole  (PROTONIX ) IV  40 mg Intravenous Q12H   Continuous Infusions:  promethazine  (PHENERGAN ) injection (IM or IVPB) 12.5 mg (12/05/24 0447)   TPN ADULT (ION) 40 mL/hr at 12/04/24 1746   TPN ADULT (ION)       LOS: 7 days    Time spent: 51 minutes    Renato Applebaum, MD [facility]   "

## 2024-12-05 NOTE — Plan of Care (Signed)

## 2024-12-05 NOTE — Progress Notes (Signed)
 PHARMACY - TOTAL PARENTERAL NUTRITION CONSULT NOTE   Indication: intolerance to enteral feeds  Patient Measurements: Height: 6' (182.9 cm) Weight: 70 kg (154 lb 5.2 oz) IBW/kg (Calculated) : 77.6 TPN AdjBW (KG): 70 Body mass index is 20.93 kg/m. Usual Weight:   Assessment: 58 yoM admitted on 12/13 with intractable nausea and vomiting, dilated small bowel loops, and coffee-ground emesis. 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  He underwent EGD 12/16 showing esophagitis,gastric stensosis,  stent placed, impaired peristalsis.  Trial NG tube clamping and CLD. Pharmacy is consulted to dose TPN on 12/20 for intolerance to enteral feeding.    Glucose / Insulin : No hx DM, no PTA meds. CBG Goal 140-180. - Note Dexamethasone  2mg  IV q12h - AM glucose 95 Electrolytes: Na 134, Cl 95, other lytes WNL   - Goal K > 4 and Mag >2 for motility Renal: SCr < 1, BUN 22 Hepatic: AST / ALT elevated and rising 111/275, would not think due to TPN, Tbili WNL and alk phos slightly elevated.  Albumin  3.5 Intake / Output; MIVF:  mIVF: none Output: Urine x2, Emesis x3 GI Imaging: 12/18 Abd xray: No bowel dilatation or evidence of obstruction. GI Surgeries / Procedures:  12/16 EGD GI meds:  Octreotide  50 mcg q8h, Protonix  40mg  q12h, Promethazine  12.5mg  q6h  Central access: Implanted Port (01/22/24) TPN start date:  12/20   Nutritional Goals: Goal TPN rate is 90 mL/hr and provides 108 g of protein and 2182 kcals per day)  RD Assessment: Estimated Needs Total Energy Estimated Needs: 2100-2450 kcals Total Protein Estimated Needs: 105-120 grams Total Fluid Estimated Needs: >/= 2.1L  Current Nutrition:  Diet: Clear liquids Tube feeds: Mallie Pinion - not taking  Plan:  Increase TPN rate to 65 mL/hr at 1800 Electrolytes in TPN: Na 2mEq/L, K 10mEq/L, Ca 27mEq/L, Mg 71mEq/L, and Phos 15mmol/L. Cl:Ac max Cl Add standard MVI and trace elements to TPN Initiate Sensitive q6h SSI and adjust as  needed  Monitor LFTs closely Monitor TPN labs on Mon/Thurs, and daily x3 days   Eva CHRISTELLA Allis, PharmD, BCPS Secure Chat if ?s 12/05/2024 9:09 AM

## 2024-12-06 DIAGNOSIS — K920 Hematemesis: Secondary | ICD-10-CM | POA: Diagnosis not present

## 2024-12-06 LAB — TRIGLYCERIDES: Triglycerides: 164 mg/dL — ABNORMAL HIGH

## 2024-12-06 LAB — GLUCOSE, CAPILLARY
Glucose-Capillary: 141 mg/dL — ABNORMAL HIGH (ref 70–99)
Glucose-Capillary: 150 mg/dL — ABNORMAL HIGH (ref 70–99)
Glucose-Capillary: 148 mg/dL — ABNORMAL HIGH (ref 70–99)

## 2024-12-06 LAB — COMPREHENSIVE METABOLIC PANEL WITH GFR
ALT: 189 U/L — ABNORMAL HIGH (ref 0–44)
AST: 48 U/L — ABNORMAL HIGH (ref 15–41)
Albumin: 3.2 g/dL — ABNORMAL LOW (ref 3.5–5.0)
Alkaline Phosphatase: 169 U/L — ABNORMAL HIGH (ref 38–126)
Anion gap: 10 (ref 5–15)
BUN: 21 mg/dL — ABNORMAL HIGH (ref 6–20)
CO2: 24 mmol/L (ref 22–32)
Calcium: 8.6 mg/dL — ABNORMAL LOW (ref 8.9–10.3)
Chloride: 97 mmol/L — ABNORMAL LOW (ref 98–111)
Creatinine, Ser: 0.65 mg/dL (ref 0.61–1.24)
GFR, Estimated: >60 mL/min
Glucose, Bld: 150 mg/dL — ABNORMAL HIGH (ref 70–99)
Potassium: 4.1 mmol/L (ref 3.5–5.1)
Sodium: 131 mmol/L — ABNORMAL LOW (ref 135–145)
Total Bilirubin: 0.6 mg/dL (ref 0.0–1.2)
Total Protein: 6.3 g/dL — ABNORMAL LOW (ref 6.5–8.1)

## 2024-12-06 LAB — MAGNESIUM: Magnesium: 2.3 mg/dL (ref 1.7–2.4)

## 2024-12-06 LAB — PHOSPHORUS: Phosphorus: 2.7 mg/dL (ref 2.5–4.6)

## 2024-12-06 MED ORDER — TRAVASOL 10 % IV SOLN
INTRAVENOUS | Status: AC
  Filled 2024-12-06: qty 1080

## 2024-12-06 NOTE — Progress Notes (Signed)
 Nutrition Follow-up  DOCUMENTATION CODES:   Severe malnutrition in context of chronic illness  INTERVENTION:  - TPN to increase to goal tonight.  - TPN management per pharmacy.   - Daily weights while on TPN.  NUTRITION DIAGNOSIS:   Severe Malnutrition related to chronic illness (gastric cancer) as evidenced by severe fat depletion, severe muscle depletion, percent weight loss (24% weight loss in less than 3 months). *ongoing  GOAL:   Patient will meet greater than or equal to 90% of their needs *progressing, on TPN  MONITOR:   Diet advancement, Labs, Weight trends  REASON FOR ASSESSMENT:   Malnutrition Screening Tool, NPO/Clear Liquid Diet (NPO x3 days)    ASSESSMENT:   58 y.o. male with PMH of gastric cancer on chemo and recent esophagitis who presented due to nausea, vomiting and coffee-ground emesis 2 days after he started new chemotherapy. Admitted for intractable nausea and vomiting, dilated small bowel loops with concern for partial SBO, and coffee-ground emesis.  12/13 Admit; NPO  12/16 s/p upper GI endoscopy with gastric stent placement and NGT placement; CLD 12/17 NPO 12/18 CLD  12/19 FLD 12/20 CLD; TPN initiated 12/21 NGT came out  TPN started 12/20 after patient continued to have nausea/vomiting with only small sips of liquids.   Daughter at bedside this AM. TPN infusing at 48mL/hr. Per pharmacy, plan is to increase to goal of 28mL/hr tonight.   Plan for patient to continue TPN at home once able to be discharged. [facility] involved for home infusion.    Admit weight: 154# Current weight: 154# (last weight 12/16, ordered daily weights)   Medications reviewed and include: Protonix , Phenergan  Q6H   Labs reviewed:  Na 131 Triglycerides 164 (as of 12/22)  Diet Order:   Diet Order             Diet NPO time specified Except for: Ice Chips, Sips with Meds, Other (See Comments)  Diet effective now                   EDUCATION NEEDS:  Education  needs have been addressed  Skin:  Skin Assessment: Reviewed RN Assessment  Last BM:  12/20  Height:  Ht Readings from Last 1 Encounters:  11/30/24 6' (1.829 m)   Weight:  Wt Readings from Last 1 Encounters:  12/06/24 76.9 kg   Ideal Body Weight:  80.91 kg  BMI:  Body mass index is 22.99 kg/m.  Estimated Nutritional Needs:  Kcal:  2100-2450 kcals Protein:  105-120 grams Fluid:  >/= 2.1L    Trude Ned RD, LDN Contact via Secure Chat.

## 2024-12-06 NOTE — Plan of Care (Signed)
" °  Problem: Education: Goal: Knowledge of General Education information will improve Description: Including pain rating scale, medication(s)/side effects and non-pharmacologic comfort measures Outcome: Progressing   Problem: Health Behavior/Discharge Planning: Goal: Ability to manage health-related needs will improve Outcome: Progressing   Problem: Clinical Measurements: Goal: Respiratory complications will improve Outcome: Progressing Goal: Cardiovascular complication will be avoided Outcome: Progressing   Problem: Activity: Goal: Risk for activity intolerance will decrease Outcome: Progressing   Problem: Nutrition: Goal: Adequate nutrition will be maintained Outcome: Progressing   Problem: Coping: Goal: Level of anxiety will decrease Outcome: Progressing   Problem: Elimination: Goal: Will not experience complications related to urinary retention Outcome: Progressing   Problem: Skin Integrity: Goal: Risk for impaired skin integrity will decrease Outcome: Progressing   Problem: Skin Integrity: Goal: Risk for impaired skin integrity will decrease Outcome: Progressing   "

## 2024-12-06 NOTE — Progress Notes (Signed)
 "                                                                                                                                                                                                          Daily Progress Note   Patient Name: Rudolf Blizard       Date: 12/06/2024 DOB: September 02, 1966  Age: 58 y.o. MRN#: [id number] Attending Physician: Raenelle Coria, MD Primary Care Physician: [name], DO Admit Date: 11/27/2024  Reason for Consultation/Follow-up: Non pain symptom management  Length of Stay: 8  Current Medications: Scheduled Meds:   Chlorhexidine  Gluconate Cloth  6 each Topical Daily   dexamethasone  (DECADRON ) injection  2 mg Intravenous Q12H   insulin  aspart  0-9 Units Subcutaneous Q6H   octreotide   50 mcg Subcutaneous Q8H   mouth rinse  15 mL Mouth Rinse 4 times per day   pantoprazole  (PROTONIX ) IV  40 mg Intravenous Q12H    Continuous Infusions:  promethazine  (PHENERGAN ) injection (IM or IVPB) 12.5 mg (12/06/24 1128)   TPN ADULT (ION) 65 mL/hr at 12/05/24 1810   TPN ADULT (ION)      PRN Meds: acetaminophen  **OR** acetaminophen , alum & mag hydroxide-simeth, hydrALAZINE , LORazepam , menthol , mouth rinse, phenol, sodium chloride  flush  Physical Exam Vitals reviewed.  Constitutional:      Appearance: He is ill-appearing.  HENT:     Head: Normocephalic and atraumatic.  Cardiovascular:     Rate and Rhythm: Normal rate.  Pulmonary:     Effort: Pulmonary effort is normal.  Skin:    General: Skin is warm and dry.  Neurological:     Mental Status: He is alert and oriented to person, place, and time.             Vital Signs: BP 109/71 (BP Location: Left Arm)   Pulse 73   Temp 98.7 F (37.1 C) (Oral)   Resp 19   Ht 6' (1.829 m)   Wt 76.9 kg   SpO2 95%   BMI 22.99 kg/m  SpO2: SpO2: 95 % O2 Device: O2 Device: Room Air O2 Flow Rate: O2 Flow Rate (L/min): 1 L/min    Patient Active Problem List   Diagnosis Date Noted   Protein-calorie malnutrition,  severe 11/30/2024   Gastric outlet obstruction 11/30/2024   Coffee ground emesis 11/28/2024   Intractable nausea and vomiting 11/28/2024   Partial small bowel obstruction (HCC) 11/28/2024   Acute kidney injury (AKI) with acute tubular necrosis (ATN) 02/05/2024   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   AKI (acute kidney injury) 02/01/2024   Gastric  cancer (HCC) 01/26/2024   Knee pain 10/14/2022   Healthcare maintenance 10/14/2022   Diabetes mellitus without complication (HCC) 10/31/2020   Cardiac arrest (HCC) 10/20/2020   History of DVT (deep vein thrombosis) 08/19/2020   Chest pain 08/19/2020   Acute hypoxemic respiratory failure due to COVID-19 [facility]) 08/08/2020   ARF (acute renal failure) 08/07/2020   Elevated LFTs 08/07/2020   Acute respiratory disease due to COVID-19 virus 08/07/2020   Pneumonia due to COVID-19 virus 08/07/2020   Acute respiratory failure due to COVID-19 [facility]) 08/06/2020    Palliative Care Assessment & Plan   Patient Profile: 58 y.o. male  with past medical history of gastric cancer (new chemotherapy 12/12), DVT with IVC filter admitted on 11/27/2024 with nausea and vomiting with coffee ground emesis.    EGD 12/4 showed grade D esophagitis without bleeding and malignant infiltrative gastric tumor in the gastric body and in the gastric antrum with antral narrowing.   CT abdomen/pelvis with contrast 12/13 showed dilated proximal small bowel suggesting SBO but without discrete transition point, thick walled gastric body/antrum without definite focal mass, mild nodularity along the pylorus.    12/16 Patient had upper GI endoscopy and stenting of gastric narrowing.  Discussion: Reviewed chart. NG tube came out on 12/20. Patient is now on TPN. He spoke to oncology this morning and decided to proceed with second dose of cisplatin  tomorrow 12/23.  Patient remains nauseated but only threw up once yesterday evening after eating two spoonfuls of broth. Patient felt hungry  and wanted to try small amounts of broth. 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 We discussed the TPN provides nutrients but not not satiate the stomach. They understand. We discussed the plan to proceed with cisplatin  tomorrow. We discussed cisplatin  is very emetogenic- they understand.  Patient and family believe the octreotide  is helping with nausea and would like to keep it on. The patient is willing to endure the nausea/vomiting for a chance at improvement.   Encouraged the patient and his family to discuss goals of care further. I encouraged the patient to consider his quality of life, overall suffering, what medical interventions would be acceptable to him in the future. I encouraged him to consider if there could be a time in which his quality of life would not be acceptable to him and he would not want to pursue further therapies. Patient and family would like to have further goals of care discussion while he is hospitalized and with an interpreter. Daughter will speak to patient's wife to figure out a time and let PMT know.  Emotional support and therapeutic listening provided. Encouraged patient and family to call PMT with needs. PMT will continue to support  Recommendations/Plan: Full code/ full scope Time for outcomes Patient plans to continue with next dose of chemotherapy Tuesday 12/23 Encouraged family to consider having goals of care discussion while hospitalized with in person spanish interpreter Continue octreotide - patient/family believe this is helping Continue PMT support   Code Status:    Code Status Orders  (From admission, onward)           Start     Ordered   11/28/24 1042  Full code  Continuous       Question:  By:  Answer:  Consent: discussion documented in EHR   11/28/24 1041         Extensive chart review has been completed prior to seeing the patient including labs, vital signs, imaging, progress/consult notes, orders, medications, and  available advance directive documents.   Care plan was discussed with bedside RN and Dr. Raenelle  Time spent: 55 minutes  Thank you for allowing the Palliative Medicine Team to assist in the care of this patient.    Stephane CHRISTELLA Palin, NP  Please contact Palliative Medicine Team phone at [phone] for questions and concerns.       "

## 2024-12-06 NOTE — Progress Notes (Signed)
 IP PROGRESS NOTE  Subjective:   Henry May appears unchanged.  The NG tube came out on 12/04/2024.  He has emesis when taking fluids by mouth.  He has nausea, but no emesis at other times.  No spontaneous flatus or bowel movements.  He had diarrhea after an enema. Objective: Vital signs in last 24 hours: Blood pressure 109/71, pulse 73, temperature 98.7 F (37.1 C), temperature source Oral, resp. rate 19, height 6' (1.829 m), weight 154 lb 5.2 oz (70 kg), SpO2 95%.  Intake/Output from previous day: 12/21 0701 - 12/22 0700 In: 1329.7 [I.V.:1161.2; IV Piggyback:168.5] Out: -   Physical Exam:  HEENT: No thrush Lungs: Clear bilaterally Cardiac: Regular rate and rhythm  Abdomen: Soft, nontender, no hepatomegaly Extremities: No leg edema   Portacath/PICC-without erythema  Lab Results: No results for input(s): WBC, HGB, HCT, PLT in the last 72 hours.   BMET Recent Labs    12/04/24 1102  NA 134*  K 4.3  CL 95*  CO2 26  GLUCOSE 95  BUN 22*  CREATININE 0.92  CALCIUM  8.7*    Lab Results  Component Value Date   CEA 3.57 01/15/2024      Medications: I have reviewed the patient's current medications.  Assessment/Plan: Gastric cancer 11/21/2023 EGD-gastric body with infiltrative looking lesion; biopsy shows involvement of a hypercellular lesion that suggests diffuse carcinoma by morphology CTs abdomen/pelvis 11/28/2023-diffuse fatty infiltration of the liver; gastric wall thickening; lymph nodes up to 6 mm in diameter near the stomach wall. 12/31/2023 upper endoscopy-large diffuse friable infiltrative polypoid and ulcerated circumferential mass found in the gastric body.  Scope was passed beyond the mass with normal antrum/pylorus.  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  Negative for HER2 (1+); mismatch repair protein IHC normal; PD-L1 CPS 0%; CLDN18 positive: 90% of tumor cells with  2+/3+ membrane staining PET scan 01/07/2024-circumferential hypermetabolic gastric mucosal thickening.  Ill-defined hypermetabolic lymph nodes in the gastrohepatic ligament.  Intense hypermetabolic activity associated with the right lobe of the thyroid  gland.  Small hypermetabolic right axillary node favored reactive. 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  Gastric wall thickening slightly improved. Cycle 7 oxaliplatin , zolbetuximab 05/05/2024, Fulphila  Cycle 8 zolbetuximab 05/20/2024, oxaliplatin  held due to neuropathy, no Fulphila  Cycle 9 oxaliplatin /zolbetuximab 06/03/2024, Fulphila  Cycle 10 oxaliplatin /zolbetuximab 06/17/2024, Fulphila  Cycle 11 oxaliplatin /zolbetuximab 06/30/2024, Fulphila , oxaliplatin  dose reduced Cycle 12 zolbetuximab 07/15/2024, oxaliplatin  held secondary to neuropathy Cycle 13 zolbetuximab 07/29/2024, oxaliplatin  held secondary to neuropathy Cycle 14 zolbetuximab 08/26/2024, oxaliplatin  held secondary to neuropathy 09/01/2024 CTs-mild distal gastric wall thickening without discrete mass.  No metastatic disease. Cycle 15 zolbetuximab 09/09/2024, oxaliplatin  held due to neuropathy Cycle 16 zolbetuximab 09/30/2024, oxaliplatin  held due to neuropathy Cycle 17 zolbetuximab 10/21/2024, oxaliplatin  held due to neuropathy 11/03/2024 CT abdomen/pelvis: Diffuse mild wall thickening of the distal gastric  body and antrum-stable, mild increase in a right gastric lymph node small volume ascites without omental nodularity, proximal jejunal mural thickening 11/04/2024 upper GI: Narrowing of the gastric antrum, normal gastric emptying, dilated duodenum and proximal jejunum without evidence of a bowel  obstruction 11/18/2024 upper endoscopy: Tumor in the gastric body and antrum with antral narrowing, dilated duodenum without evidence of mass or obstruction, esophagitis: Biopsy of the gastric mass-poorly differentiated adenocarcinoma with focal signet ring morphology, esophagus biopsy: Squamous mucosa with focal ulceration Cycle 1 weekly cisplatin  11/26/2024 Early satiety, postprandial abdominal pain, weight loss secondary to #1 History of bilateral lower extremity DVT 2021-anticoagulation discontinued due to massive retroperitoneal bleed, IVC filter placed 08/24/2020 Hospitalized with acute respiratory failure due to COVID-19 08/06/2020 - 09/04/2020 History of massive retroperitoneal bleed secondary to anticoagulation September 2021 Thyroid  ultrasound 01/13/2024-no abnormal nodule identified in the right lobe.  1.1 cm thyroid  isthmus nodule meets criteria for 1 year follow-up ultrasound. Admission 02/01/2024 with increased abdominal pain/ascites Hospitalization with altered mental status 02/05/2024 through 02/13/2024; question rare case of 5-FU psychosis.  Improved 02/16/2024.  Mental status at baseline 02/23/2024. Neutropenia 02/16/2024 Mucositis 02/16/2024.  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  Persistent cold sensitivity 05/20/2024, oxaliplatin  held.  Cold sensitivity resolved 06/03/2024, oxaliplatin  resumed.  Mild loss of vibratory sense, oxaliplatin  dose reduced 06/29/2024 Admission 11/28/2024 with intractable nausea and vomiting 11/27/2024 CT  Abdo/pelvis: Bilateral lower lobe aspiration/pneumonia, thick-walled gastric body/antrum, dilated proximal small bowel, small volume pelvic ascites 11/30/2024 EGD: Malignant appearing severe stenosis at the gastric antrum-stent placed, dilated duodenum with retained fluid   Henry May has metastatic gastric cancer.  There is evidence of disease progression with persistent/progressive tumor in the stomach and intractable nausea/vomiting.  The omental disease on presentation last year remains significantly improved, by CT criteria.  He responded well to initial treatment with oxaliplatin  based chemotherapy.  He has neuropathy symptoms.  He began a trial of salvage therapy with cisplatin  on 11/26/2024.  He is now admitted with intractable nausea/vomiting, potentially related to a small bowel obstruction, ileus from the carcinomatosis, in addition to persistent tumor in the stomach.   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  There was retained fluid in the stomach and duodenum.  A gastric antral stent was placed.  He continues to have nausea and vomiting of liquids despite placement of the gastric antral stent.  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  I discussed the difficult situation with Henry May and his family.  They understand there is a small chance of clinical improvement with further systemic therapy.  He has received only 1 treatment with cisplatin .  He would like to continue treatment.  I will schedule treatment with cisplatin  for tomorrow.  He is now maintained on TPN.  We will request the TPN be cycled at night.  He can be discharged in the next few days with home TPN.  We will continue outpatient chemotherapy.  We would consider third line treatment with paclitaxel/ramucirumab if he does not improve with cisplatin .    Recommendations: Change antiemetics to oral dosing (lorazepam  and  Phenergan ) Consider discontinuing octreotide  and Decadron , not clear he is benefiting from these Week #2 cisplatin  tomorrow Ask pharmacy to cycle the TPN at night   LOS: 8 days   Arley Hof, MD   12/06/2024, 8:09 AM

## 2024-12-06 NOTE — Plan of Care (Signed)

## 2024-12-06 NOTE — TOC Progression Note (Addendum)
 Transition of Care [facility]) - Progression Note   Patient Details  Name: Henry May MRN: [id number] Date of Birth: Aug 04, 1966  Transition of [facility]) CM/SW Contact  Duwaine GORMAN Aran, LCSW Phone Number: 12/06/2024, 10:12 AM  Clinical Narrative: Care management continuing to follow for possible home TPN needs.  Addendum: CSW followed up with Pam with [employer] and HHRN order was requested. Hospitalist placed [facility] order.  Barriers to Discharge: Continued Medical Work up  Social Drivers of Health (SDOH) Interventions SDOH Screenings   Food Insecurity: Patient Declined (11/28/2024)  Housing: Unknown (11/28/2024)  Transportation Needs: Patient Declined (11/28/2024)  Utilities: Patient Declined (11/28/2024)  Depression (PHQ2-9): Medium Risk (11/26/2024)  Social Connections: Unknown (04/30/2022)   Received from [facility]  Tobacco Use: Medium Risk (11/30/2024)   Readmission Risk Interventions    02/07/2024    8:40 AM  Readmission Risk Prevention Plan  Transportation Screening Complete  HRI or Home Care Consult Complete  Social Work Consult for Recovery Care Planning/Counseling Complete  Palliative Care Screening Not Applicable  Medication Review [employer]) Complete

## 2024-12-06 NOTE — Progress Notes (Signed)
 VAST RN consulted to assess PIV earlier in day d/t pump beeping occluded. Upon arrival at bedside, PIV flushed easily without issue. Upon restarting phenergan  infusion, pump beeping occluded. Unit nurse contacted pharmacy and was informed phenergan  could run with TPN and administered it as such. Upon notification of infusion by unit nurse, education provided that extreme caution should be taken when any medications are administered with TPN. Education plan provided for future doses of phenergan  to be administered with TPN through port, maintaining aseptic technique when each dose administered and not disconnecting tubing from TPN tubing at any time.  New bag of phenergan  with new primary tubing to be available at bedside each day at 1700 to be hung with new bag of TPN, after both needleless caps changed on port extension (by VAST RN).  Phenergan  doses should be run as a primary infusion as the TPN acts as diluent  per pharmacy. No flushing or disconnection of tubing post administration. Unit nurse provided written instructions to pass along during shift report as well as a copy to leave with charge nurse.

## 2024-12-06 NOTE — Progress Notes (Signed)
 PHARMACY - TOTAL PARENTERAL NUTRITION CONSULT NOTE   Indication: intolerance to enteral feeds  Patient Measurements: Height: 6' (182.9 cm) Weight: 70 kg (154 lb 5.2 oz) IBW/kg (Calculated) : 77.6 TPN AdjBW (KG): 70 Body mass index is 20.93 kg/m. Usual Weight:   Assessment: 58 yoM admitted on 12/13 with intractable nausea and vomiting, dilated small bowel loops, and coffee-ground emesis. 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  He underwent EGD 12/16 showing esophagitis,gastric stensosis,  stent placed, impaired peristalsis.  Trial NG tube clamping and CLD. Pharmacy is consulted to dose TPN on 12/20 for intolerance to enteral feeding.    Glucose / Insulin : No hx DM, no PTA meds. - on Dexamethasone  2mg  IV q12h - on sSSI q6h (used 6 units in the past 24 hrs) - CBGs (goal <150): wnl Electrolytes: Na 131, Cl slightly low at 97; other lytes WNL   Renal: SCr < 1, BUN 21 Hepatic: AST / ALT elevated but down 48/189 -  Tbili WNL and alk phos slightly elevated -  Albumin  3.2 Intake / Output; MIVF: not on IVF - I/O: +1329 mL  - LBM: 12/20 GI Imaging: - 12/18 Abd xray: No bowel dilatation or evidence of obstruction. Retained gastric fluid. 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 Prosthesis placed. Duodenal dilation deformity  GI Surgeries / Procedures:  - 12/16 EGD: esophagitis  GI meds:  Octreotide  50 mcg q8h, Protonix  40mg  q12h  Central access: Implanted Port (01/22/24) TPN start date:  12/20   Nutritional Goals: Goal TPN rate is 90 mL/hr and provides 108 g of protein and 2182 kcals per day)  RD Assessment: Estimated Needs Total Energy Estimated Needs: 2100-2450 kcals Total Protein Estimated Needs: 105-120 grams Total Fluid Estimated Needs: >/= 2.1L  Current Nutrition:  Diet: Clear liquids Tube feeds: Mallie Pinion - not taking  Plan:   Today, at 1800: - Increase TPN to goal rate  90 mL/hr - Electrolytes in TPN:  Increase Na  to 150 mEq/L K 1mEq/L Ca 54mEq/L Mg 36mEq/L Phos 15mmol/L Cl:Ac max Cl - Add standard MVI and trace elements to TPN - continue  Sensitive q6h SSI and adjust as needed  - Monitor LFTs closely - Monitor TPN labs on Mon/Thurs, and daily x3 days   Iantha Batch, PharmD, BCPS 12/06/2024 7:15 AM

## 2024-12-06 NOTE — Progress Notes (Signed)
 " PROGRESS NOTE    Henry May  FMW:[id number] DOB: 1965/12/18 DOA: 11/27/2024 PCP: [name], DO    Brief Narrative:  58 year old M with PMH of DVT with IVC filter, gastric cancer on chemo and esophagitis presented to [facility] due to nausea, vomiting and coffee-ground emesis 2 days after he was started new chemotherapy, and admitted for intractable nausea and vomiting, dilated small bowel loops and coffee-ground emesis.  Recent EGD on 12/4 with LA grade D esophagitis without bleeding, and large infiltrative, sessile and ulcerated circumferential mass with contact of bleeding in gastric body and antrum with antral narrowing and luminal diameter of approximately 1.5 cm.  In ED, stable vitals.  CBC and CMP without significant finding.  Lipase 171.  CT abdomen and pelvis showed dilated proximal small bowel suggesting SBO but without discrete transition point, thick walled gastric body/antrum without definite focal mass, mild nodularity along the pylorus, scarring with superimposed patchy peribronchovascular nodularity in the bilateral lower lobes right more than left suggesting mild aspiration or pneumonia.  Patient was started on antiemetics, IV fluid, PPI and admitted for further care.  Oncology, surgery, GI and palliative medicine consulted. 12/16, underwent upper GI endoscopy and stenting of the gastric narrowing.  Subjective:  Patient seen and examined.  Looks anxious.  Daughter at the bedside. Patient vomited after taking two spoonful of chicken soup last night.  He is on around-the-clock injectable nausea medicine but has not nausea on top of it.  Tolerating TPN.  Case discussed with oncology, they decided to take chemotherapy tomorrow.  Patient is still on around-the-clock nausea medicine injectable as well as Protonix  IV and is still vomiting. Still n.p.o. with sips and chips, continue current regimen of Protonix , Phenergan , Ativan .  Also on octreotide .  No need for laxatives as  he is not eating anything.  I discussed with patient and his daughter at the bedside about challenging symptom control, chemotherapy side effects.  Discussed with family and patient that he may choose not to do chemotherapy and choose a palliative and hospice pathway if he desires and this is equally good alternative if he is suffering too much.  Discussed with palliative care team.   Assessment & Plan:   Intractable nausea and vomiting related to gastric cancer, gastric luminal narrowing and esophagitis: Persistently symptomatic.  Unable to even tolerate clear liquids. Encourage sips and chips just for comfort. Holding off on reinserting NG tube yet. On IV Protonix . Scheduled Phenergan  and as needed Ativan  Dexamethasone  as per palliative care Patient also on octreotide  injection. Upper GI endoscopy and stenting of gastric stenosis-NG tube decompression. Hemoglobin has remained stable.  Currently no need for transfusion. Patient with advanced gastric cancer, followed by oncology.   Not showing any evidence of tolerating diet, started on TPN.  Will need TPN at home.  Possible aspiration pneumonitis and pneumonia: Chest x-ray with right middle lobe opacity.  Bibasilar infiltrates.  Completing 7 days of therapy.  Does not need further antibiotics.  Type 2 diabetes, diet controlled: On SSI while on steroids.  History of DVT: Cannot be anticoagulated.  Status post IVC filter.  Goal of care:  Aggressive gastric cancer with poor prognosis.  Oncology and palliative following.   Educated, encouraged consideration for palliation and hospice pathway. Appreciate palliative care coordination.    DVT prophylaxis: SCDs Start: 11/28/24 1042   Code Status: Full code Family Communication: Daughter at the bedside. Disposition Plan: Status is: Inpatient Remains inpatient appropriate because: Unable to eat     Consultants:  GI Palliative Oncology  Procedures:  Upper GI endoscopy and  stenting of the gastric narrowing  Antimicrobials:  Unasyn      Objective: Vitals:   12/04/24 2223 12/05/24 0342 12/05/24 1948 12/06/24 0539  BP: 121/72 124/81 126/81 109/71  Pulse: 67 72 79 73  Resp: 18 19 18 19   Temp: 98.9 F (37.2 C) 98.5 F (36.9 C)  98.7 F (37.1 C)  TempSrc: Oral Oral  Oral  SpO2: 97% 93% 99% 95%  Weight:      Height:        Intake/Output Summary (Last 24 hours) at 12/06/2024 1115 Last data filed at 12/06/2024 0400 Gross per 24 hour  Intake 1329.66 ml  Output --  Net 1329.66 ml   Filed Weights   11/29/24 0511 11/30/24 1437  Weight: 70 kg 70 kg    Examination:  General exam: More anxious today.  Not in any distress.  Able to keep up the conversation. Respiratory system: Clear to auscultation. Respiratory effort normal.  No added sounds.  On room air. Right chest wall Port-A-Cath present. Cardiovascular system: S1 & S2 heard, RRR. No JVD, murmurs, rubs, gallops or clicks. No pedal edema. Gastrointestinal system: No distention, soft and nontender.  Bowel sounds present. Central nervous system: Alert and oriented. No focal neurological deficits. Extremities: Symmetric 5 x 5 power.  Gross generalized weakness.   Data Reviewed: I have personally reviewed following labs and imaging studies  CBC: Recent Labs  Lab 11/30/24 0441 12/01/24 0006 12/02/24 1136  WBC 4.5 4.3  --   NEUTROABS  --   --  4.4  HGB 11.7* 11.0*  --   HCT 35.8* 34.3*  --   MCV 92.0 92.2  --   PLT 217 208  --    Basic Metabolic Panel: Recent Labs  Lab 11/30/24 0441 12/01/24 0006 12/04/24 1102 12/06/24 0748  NA 139 134* 134* 131*  K 3.5 4.2 4.3 4.1  CL 103 99 95* 97*  CO2 28 27 26 24   GLUCOSE 138* 105* 95 150*  BUN 17 15 22* 21*  CREATININE 0.73 0.67 0.92 0.65  CALCIUM  8.8* 8.6* 8.7* 8.6*  MG 2.0 2.0 2.2 2.3  PHOS  --   --  2.7 2.7   GFR: Estimated Creatinine Clearance: 97.2 mL/min (by C-G formula based on SCr of 0.65 mg/dL). Liver Function Tests: Recent  Labs  Lab 11/30/24 0441 12/01/24 0006 12/04/24 1102 12/06/24 0748  AST 17 47* 111* 48*  ALT 35 66* 275* 189*  ALKPHOS 77 99 172* 169*  BILITOT 0.4 0.5 0.9 0.6  PROT 6.1* 5.9* 6.3* 6.3*  ALBUMIN  3.1* 3.0* 3.5 3.2*   Recent Labs  Lab 11/30/24 0441  LIPASE 53*   No results for input(s): AMMONIA in the last 168 hours. Coagulation Profile: No results for input(s): INR, PROTIME in the last 168 hours.  Cardiac Enzymes: No results for input(s): CKTOTAL, CKMB, CKMBINDEX, TROPONINI in the last 168 hours. BNP (last 3 results) No results for input(s): PROBNP in the last 8760 hours. HbA1C: No results for input(s): HGBA1C in the last 72 hours.  CBG: Recent Labs  Lab 12/05/24 1146 12/05/24 2346 12/06/24 0538  GLUCAP 174* 145* 141*   Lipid Profile: Recent Labs    12/06/24 0748  TRIG 164*   Thyroid  Function Tests: No results for input(s): TSH, T4TOTAL, FREET4, T3FREE, THYROIDAB in the last 72 hours. Anemia Panel: No results for input(s): VITAMINB12, FOLATE, FERRITIN, TIBC, IRON, RETICCTPCT in the last 72 hours. Sepsis Labs: No results for  input(s): PROCALCITON, LATICACIDVEN in the last 168 hours.  No results found for this or any previous visit (from the past 240 hours).       Radiology Studies: No results found.       Scheduled Meds:  Chlorhexidine  Gluconate Cloth  6 each Topical Daily   dexamethasone  (DECADRON ) injection  2 mg Intravenous Q12H   insulin  aspart  0-9 Units Subcutaneous Q6H   octreotide   50 mcg Subcutaneous Q8H   mouth rinse  15 mL Mouth Rinse 4 times per day   pantoprazole  (PROTONIX ) IV  40 mg Intravenous Q12H   Continuous Infusions:  promethazine  (PHENERGAN ) injection (IM or IVPB) 12.5 mg (12/06/24 0551)   TPN ADULT (ION) 65 mL/hr at 12/05/24 1810   TPN ADULT (ION)       LOS: 8 days    Time spent: 51 minutes    Renato Applebaum, MD [facility]   "

## 2024-12-07 ENCOUNTER — Encounter: Payer: Self-pay | Admitting: Oncology

## 2024-12-07 DIAGNOSIS — K920 Hematemesis: Secondary | ICD-10-CM | POA: Diagnosis not present

## 2024-12-07 LAB — PHOSPHORUS: Phosphorus: 3.1 mg/dL (ref 2.5–4.6)

## 2024-12-07 LAB — GLUCOSE, CAPILLARY
Glucose-Capillary: 143 mg/dL — ABNORMAL HIGH (ref 70–99)
Glucose-Capillary: 149 mg/dL — ABNORMAL HIGH (ref 70–99)
Glucose-Capillary: 151 mg/dL — ABNORMAL HIGH (ref 70–99)
Glucose-Capillary: 155 mg/dL — ABNORMAL HIGH (ref 70–99)
Glucose-Capillary: 172 mg/dL — ABNORMAL HIGH (ref 70–99)

## 2024-12-07 LAB — BASIC METABOLIC PANEL WITH GFR
Anion gap: 9 (ref 5–15)
BUN: 23 mg/dL — ABNORMAL HIGH (ref 6–20)
CO2: 23 mmol/L (ref 22–32)
Calcium: 8.6 mg/dL — ABNORMAL LOW (ref 8.9–10.3)
Chloride: 102 mmol/L (ref 98–111)
Creatinine, Ser: 0.67 mg/dL (ref 0.61–1.24)
GFR, Estimated: >60 mL/min
Glucose, Bld: 144 mg/dL — ABNORMAL HIGH (ref 70–99)
Potassium: 4.4 mmol/L (ref 3.5–5.1)
Sodium: 135 mmol/L (ref 135–145)

## 2024-12-07 LAB — CBC WITH DIFFERENTIAL/PLATELET
Abs Immature Granulocytes: 0.03 K/uL (ref 0.00–0.07)
Basophils Absolute: 0 K/uL (ref 0.0–0.1)
Basophils Relative: 1 %
Eosinophils Absolute: 0.1 K/uL (ref 0.0–0.5)
Eosinophils Relative: 2 %
HCT: 35.6 % — ABNORMAL LOW (ref 39.0–52.0)
Hemoglobin: 12 g/dL — ABNORMAL LOW (ref 13.0–17.0)
Immature Granulocytes: 1 %
Lymphocytes Relative: 18 %
Lymphs Abs: 0.7 K/uL (ref 0.7–4.0)
MCH: 30.2 pg (ref 26.0–34.0)
MCHC: 33.7 g/dL (ref 30.0–36.0)
MCV: 89.7 fL (ref 80.0–100.0)
Monocytes Absolute: 0.3 K/uL (ref 0.1–1.0)
Monocytes Relative: 9 %
Neutro Abs: 2.6 K/uL (ref 1.7–7.7)
Neutrophils Relative %: 69 %
Platelets: 292 K/uL (ref 150–400)
RBC: 3.97 MIL/uL — ABNORMAL LOW (ref 4.22–5.81)
RDW: 13.4 % (ref 11.5–15.5)
WBC: 3.8 K/uL — ABNORMAL LOW (ref 4.0–10.5)
nRBC: 0 % (ref 0.0–0.2)

## 2024-12-07 LAB — MAGNESIUM: Magnesium: 2.2 mg/dL (ref 1.7–2.4)

## 2024-12-07 MED ORDER — PROMETHAZINE HCL 25 MG RE SUPP
25.0000 mg | Freq: Four times a day (QID) | RECTAL | Status: DC | PRN
  Administered 2024-12-07 – 2024-12-08 (×4): 25 mg via RECTAL
  Filled 2024-12-07 (×5): qty 1

## 2024-12-07 MED ORDER — TRAVASOL 10 % IV SOLN
INTRAVENOUS | Status: AC
  Filled 2024-12-07: qty 1080

## 2024-12-07 MED ORDER — SODIUM CHLORIDE 0.9 % IV SOLN
35.0000 mg/m2 | Freq: Once | INTRAVENOUS | Status: AC
  Administered 2024-12-07: 66 mg via INTRAVENOUS
  Filled 2024-12-07: qty 66

## 2024-12-07 MED ORDER — SODIUM CHLORIDE 0.9 % IV SOLN
150.0000 mg | Freq: Once | INTRAVENOUS | Status: AC
  Administered 2024-12-07: 150 mg via INTRAVENOUS
  Filled 2024-12-07: qty 5

## 2024-12-07 MED ORDER — DEXAMETHASONE SOD PHOSPHATE PF 10 MG/ML IJ SOLN
10.0000 mg | Freq: Once | INTRAMUSCULAR | Status: AC
  Administered 2024-12-07: 10 mg via INTRAVENOUS

## 2024-12-07 MED ORDER — POTASSIUM CHLORIDE IN NACL 20-0.9 MEQ/L-% IV SOLN
Freq: Once | INTRAVENOUS | Status: AC
  Filled 2024-12-07: qty 1000

## 2024-12-07 MED ORDER — PROMETHAZINE HCL 12.5 MG PO TABS: 6.0000 mg | ORAL_TABLET | Freq: Four times a day (QID) | ORAL | Status: DC | PRN

## 2024-12-07 MED ORDER — INSULIN ASPART 100 UNIT/ML IJ SOLN
0.0000 [IU] | Freq: Four times a day (QID) | INTRAMUSCULAR | Status: DC
  Administered 2024-12-07: 2 [IU] via SUBCUTANEOUS
  Filled 2024-12-07: qty 2

## 2024-12-07 MED ORDER — MAGNESIUM SULFATE 2 GM/50ML IV SOLN
2.0000 g | Freq: Once | INTRAVENOUS | Status: AC
  Administered 2024-12-07: 2 g via INTRAVENOUS
  Filled 2024-12-07: qty 50

## 2024-12-07 MED ORDER — PALONOSETRON HCL INJECTION 0.25 MG/5ML
0.2500 mg | Freq: Once | INTRAVENOUS | Status: AC
  Administered 2024-12-07: 0.25 mg via INTRAVENOUS
  Filled 2024-12-07: qty 5

## 2024-12-07 MED ORDER — TRAVASOL 10 % IV SOLN: INTRAVENOUS | Status: DC

## 2024-12-07 MED ORDER — LORAZEPAM 0.5 MG PO TABS
0.5000 mg | ORAL_TABLET | Freq: Four times a day (QID) | ORAL | Status: DC | PRN
  Administered 2024-12-07 – 2024-12-09 (×3): 0.5 mg via SUBLINGUAL
  Filled 2024-12-07 (×3): qty 1

## 2024-12-07 NOTE — Progress Notes (Signed)
 "                                                                                                                                                                                                         Palliative Medicine Progress Note   Patient Name: Henry May       Date: 12/07/2024 DOB: July 20, 1966  Age: 58 y.o. MRN#: [id number] Attending Physician: Raenelle Coria, MD Primary Care Physician: [name], DO Admit Date: 11/27/2024  Reason for Consultation/Follow-up: symptom management, GOC  HPI/Patient Profile: 58 y.o. male  with past medical history of gastric cancer (new chemotherapy 12/12), DVT with IVC filter admitted on 11/27/2024 with nausea and vomiting with coffee ground emesis.    EGD 12/4 showed grade D esophagitis without bleeding and malignant infiltrative gastric tumor in the gastric body and in the gastric antrum with antral narrowing.   CT abdomen/pelvis with contrast 12/13 showed dilated proximal small bowel suggesting SBO but without discrete transition point, thick walled gastric body/antrum without definite focal mass, mild nodularity along the pylorus.    12/16 Patient had upper GI endoscopy and stenting of gastric narrowing.  Subjective: Chart reviewed. Patient currently receiving chemo infusion (cisplatin ). He reports ongoing nausea, but has only vomited x 1 today.   I met with his daughter at bedside as a follow-up for palliative needs and emotional support. After encouragement from palliative NP yesterday, daughter spoke with patient's wife, and she has reluctantly agreed to discuss goals of care.  I called the office of interpreter services [phone]) in attempt to schedule an in-person interpreter for a meeting tomorrow, but they did not answer and I suspect they may be closed 12/24 and 12/25.   I let daughter know that we would try to schedule a meeting after the holiday, or if patient was discharged before then, they will follow-up with the outpatient  palliative clinic at [facility] (this referral has already been made). Daughter verbalizes understanding and expresses appreciation for ongoing palliative support.    Objective:  Physical Exam Vitals reviewed.  Constitutional:      General: He is not in acute distress.    Appearance: He is ill-appearing.  Pulmonary:     Effort: Pulmonary effort is normal.  Skin:    General: Skin is warm and dry.  Neurological:     Mental Status: He is alert and oriented to person, place, and time.               Palliative Medicine Assessment & Plan   Assessment: Principal Problem:   Coffee ground emesis Active  Problems:   History of DVT (deep vein thrombosis)   Diabetes mellitus without complication (HCC)   Gastric cancer (HCC)   Intractable nausea and vomiting   Partial small bowel obstruction (HCC)   Protein-calorie malnutrition, severe   Gastric outlet obstruction    Recommendations/Plan: Full code/ full scope Time for outcomes Patient/family agreeable to goals of care discussion while hospitalized with in person spanish interpreter - after 12/25 Referral to outpatient palliative clinic at [facility], [place] NP) Continue PMT support    Thank you for allowing the Palliative Medicine Team to assist in the care of this patient.   Time: 28 minutes  Detailed review of medical records (labs, imaging, vital signs), medically appropriate exam, discussed with treatment team, counseling and education to patient, family, & staff, documenting clinical information, medication management, coordination of care.    Cobey Raineri B Trayton Szabo, NP   Please contact Palliative Medicine Team phone at [phone] for questions and concerns.  For individual providers, please see AMION.      "

## 2024-12-07 NOTE — Plan of Care (Signed)

## 2024-12-07 NOTE — Progress Notes (Signed)
 Chemo RN presented to patient room. Patient verified using two separate identifiers. Role of Chemo RN explained to patient and family. Consent for administration of cisplatin  obtained and placed in chart. Pre-hydration given. Pre-meds administered and allowed time to take effect. Cisplatin  infusion completed without incident. Post-hydration fluids started, will be taken down by Primary RN.

## 2024-12-07 NOTE — Progress Notes (Signed)
 " PROGRESS NOTE    Henry May  FMW:[id number] DOB: 11-29-1966 DOA: 11/27/2024 PCP: [name], DO    Brief Narrative:  58 year old M with PMH of DVT with IVC filter, gastric cancer on chemo and esophagitis presented to [facility] due to nausea, vomiting and coffee-ground emesis 2 days after he was started new chemotherapy, and admitted for intractable nausea and vomiting, dilated small bowel loops and coffee-ground emesis.  Recent EGD on 12/4 with LA grade D esophagitis without bleeding, and large infiltrative, sessile and ulcerated circumferential mass with contact of bleeding in gastric body and antrum with antral narrowing and luminal diameter of approximately 1.5 cm.  In ED, stable vitals.  CBC and CMP without significant finding.  Lipase 171.  CT abdomen and pelvis showed dilated proximal small bowel suggesting SBO but without discrete transition point, thick walled gastric body/antrum without definite focal mass, mild nodularity along the pylorus, scarring with superimposed patchy peribronchovascular nodularity in the bilateral lower lobes right more than left suggesting mild aspiration or pneumonia.  Patient was started on antiemetics, IV fluid, PPI and admitted for further care.  Oncology, surgery, GI and palliative medicine consulted. 12/16, underwent upper GI endoscopy and stenting of the gastric narrowing.  Subjective:  Patient seen and examined.  Wife and daughter and a family friend at the bedside.  Patient continues to have some dry cough.  He continues to vomit even without eating.  He had 3 episodes of vomiting last night. Preparing for chemotherapy today however patient is really nervous.  Oncology and patient had discussion to go ahead for chemotherapy, however with his uncontrolled symptoms and not eating and vomiting even while he is n.p.o. I am worried about his outcome. Signed orders for home TPN.   Assessment & Plan:   Intractable nausea and vomiting related  to gastric cancer, gastric luminal narrowing and esophagitis: Persistently symptomatic.  Unable to even tolerate clear liquids. Encourage sips and chips just for comfort. Holding off on reinserting NG tube yet. On IV Protonix . Scheduled Phenergan  and as needed Ativan .  Trying oral Phenergan  and Ativan  today in anticipation of transitioning home. Was on dexamethasone  and octreotide , discontinued. Upper GI endoscopy and stenting of gastric stenosis-NG tube decompression. Hemoglobin has remained stable.  Currently no need for transfusion. Patient with advanced gastric cancer, followed by oncology.   Not showing any evidence of tolerating diet, started on TPN.  Will need TPN at home.  Possible aspiration pneumonitis and pneumonia: Chest x-ray with right middle lobe opacity.  Bibasilar infiltrates.  Completing 7 days of therapy.  Does not need further antibiotics.  Type 2 diabetes, diet controlled: On SSI while on steroids.  History of DVT: Cannot be anticoagulated.  Status post IVC filter.  Goal of care:  Aggressive gastric cancer with poor prognosis.  Oncology and palliative following.   Educated, encouraged consideration for palliation and hospice pathway. Appreciate palliative care coordination.    DVT prophylaxis: SCDs Start: 11/28/24 1042   Code Status: Full code Family Communication: Wife and daughter at bedside. Disposition Plan: Status is: Inpatient Remains inpatient appropriate because: Unable to eat, inpatient chemotherapy.     Consultants:  GI Palliative Oncology  Procedures:  Upper GI endoscopy and stenting of the gastric narrowing  Antimicrobials:  Unasyn , completed therapy     Objective: Vitals:   12/06/24 1100 12/06/24 1442 12/07/24 0022 12/07/24 0541  BP:  113/68 116/78 115/74  Pulse:  71 78 75  Resp:  17  19  Temp:  98.5 F (36.9 C)  98.4 F (36.9 C) 98.6 F (37 C)  TempSrc:  Oral Oral Oral  SpO2:  98% 98% 95%  Weight: 76.9 kg   76.7 kg   Height:        Intake/Output Summary (Last 24 hours) at 12/07/2024 1317 Last data filed at 12/07/2024 1158 Gross per 24 hour  Intake 2326.58 ml  Output 500 ml  Net 1826.58 ml   Filed Weights   11/30/24 1437 12/06/24 1100 12/07/24 0541  Weight: 70 kg 76.9 kg 76.7 kg    Examination:  General exam: Sick looking.  Anxious. Respiratory system: Room air.  Conducted upper airway sounds. Right chest wall Port-A-Cath present. Cardiovascular system: S1 & S2 heard, RRR. No JVD, murmurs, rubs, gallops or clicks. No pedal edema. Gastrointestinal system: No distention, soft and nontender.  Bowel sounds present. Central nervous system: Alert and oriented. No focal neurological deficits. Extremities: Symmetric 5 x 5 power.  Gross generalized weakness.   Data Reviewed: I have personally reviewed following labs and imaging studies  CBC: Recent Labs  Lab 12/01/24 0006 12/02/24 1136 12/07/24 0739  WBC 4.3  --  3.8*  NEUTROABS  --  4.4 2.6  HGB 11.0*  --  12.0*  HCT 34.3*  --  35.6*  MCV 92.2  --  89.7  PLT 208  --  292   Basic Metabolic Panel: Recent Labs  Lab 12/01/24 0006 12/04/24 1102 12/06/24 0748 12/07/24 0739  NA 134* 134* 131* 135  K 4.2 4.3 4.1 4.4  CL 99 95* 97* 102  CO2 27 26 24 23   GLUCOSE 105* 95 150* 144*  BUN 15 22* 21* 23*  CREATININE 0.67 0.92 0.65 0.67  CALCIUM  8.6* 8.7* 8.6* 8.6*  MG 2.0 2.2 2.3 2.2  PHOS  --  2.7 2.7 3.1   GFR: Estimated Creatinine Clearance: 106.5 mL/min (by C-G formula based on SCr of 0.67 mg/dL). Liver Function Tests: Recent Labs  Lab 12/01/24 0006 12/04/24 1102 12/06/24 0748  AST 47* 111* 48*  ALT 66* 275* 189*  ALKPHOS 99 172* 169*  BILITOT 0.5 0.9 0.6  PROT 5.9* 6.3* 6.3*  ALBUMIN  3.0* 3.5 3.2*   No results for input(s): LIPASE, AMYLASE in the last 168 hours.  No results for input(s): AMMONIA in the last 168 hours. Coagulation Profile: No results for input(s): INR, PROTIME in the last 168 hours.  Cardiac  Enzymes: No results for input(s): CKTOTAL, CKMB, CKMBINDEX, TROPONINI in the last 168 hours. BNP (last 3 results) No results for input(s): PROBNP in the last 8760 hours. HbA1C: No results for input(s): HGBA1C in the last 72 hours.  CBG: Recent Labs  Lab 12/06/24 1133 12/06/24 1833 12/07/24 0018 12/07/24 0539 12/07/24 1059  GLUCAP 150* 148* 155* 143* 151*   Lipid Profile: Recent Labs    12/06/24 0748  TRIG 164*   Thyroid  Function Tests: No results for input(s): TSH, T4TOTAL, FREET4, T3FREE, THYROIDAB in the last 72 hours. Anemia Panel: No results for input(s): VITAMINB12, FOLATE, FERRITIN, TIBC, IRON, RETICCTPCT in the last 72 hours. Sepsis Labs: No results for input(s): PROCALCITON, LATICACIDVEN in the last 168 hours.  No results found for this or any previous visit (from the past 240 hours).       Radiology Studies: No results found.       Scheduled Meds:  Chlorhexidine  Gluconate Cloth  6 each Topical Daily   CISplatin   35 mg/m2 (Treatment Plan Recorded) Intravenous Once   dexamethasone  (DECADRON ) injection  2 mg Intravenous Q12H   insulin  aspart  0-9 Units Subcutaneous Q6H   octreotide   50 mcg Subcutaneous Q8H   mouth rinse  15 mL Mouth Rinse 4 times per day   pantoprazole  (PROTONIX ) IV  40 mg Intravenous Q12H   Continuous Infusions:  promethazine  (PHENERGAN ) injection (IM or IVPB) 12.5 mg (12/07/24 0419)   TPN ADULT (ION) 90 mL/hr at 12/07/24 0000   TPN CYCLIC-ADULT (ION)       LOS: 9 days    Time spent: 51 minutes    Renato Applebaum, MD [facility]   "

## 2024-12-07 NOTE — Progress Notes (Signed)
 Ok to treat with most recent labs per MD Marquita)

## 2024-12-07 NOTE — Progress Notes (Signed)
 IP PROGRESS NOTE  Subjective:   Henry May appears continues to have intermittent nausea and vomiting.  No bowel movement or flatus.  He is not eating. Objective: Vital signs in last 24 hours: Blood pressure 115/74, pulse 75, temperature 98.6 F (37 C), temperature source Oral, resp. rate 19, height 6' (1.829 m), weight 169 lb 1.5 oz (76.7 kg), SpO2 95%.  Intake/Output from previous day: 12/22 0701 - 12/23 0700 In: 1597.8 [I.V.:1447.9; IV Piggyback:150] Out: -   Physical Exam:  HEENT: No thrush Lungs: Clear bilaterally Cardiac: Regular rate and rhythm  Abdomen: Soft, nontender, no hepatomegaly Extremities: No leg edema   Portacath/PICC-without erythema  Lab Results: No results for input(s): WBC, HGB, HCT, PLT in the last 72 hours.   BMET Recent Labs    12/04/24 1102 12/06/24 0748  NA 134* 131*  K 4.3 4.1  CL 95* 97*  CO2 26 24  GLUCOSE 95 150*  BUN 22* 21*  CREATININE 0.92 0.65  CALCIUM  8.7* 8.6*    Lab Results  Component Value Date   CEA 3.57 01/15/2024      Medications: I have reviewed the patient's current medications.  Assessment/Plan: Gastric cancer 11/21/2023 EGD-gastric body with infiltrative looking lesion; biopsy shows involvement of a hypercellular lesion that suggests diffuse carcinoma by morphology CTs abdomen/pelvis 11/28/2023-diffuse fatty infiltration of the liver; gastric wall thickening; lymph nodes up to 6 mm in diameter near the stomach wall. 12/31/2023 upper endoscopy-large diffuse friable infiltrative polypoid and ulcerated circumferential mass found in the gastric body.  Scope was passed beyond the mass with normal antrum/pylorus.  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  Negative for HER2 (1+); mismatch repair protein IHC normal; PD-L1 CPS 0%; CLDN18 positive: 90% of tumor cells with 2+/3+ membrane staining PET scan 01/07/2024-circumferential  hypermetabolic gastric mucosal thickening.  Ill-defined hypermetabolic lymph nodes in the gastrohepatic ligament.  Intense hypermetabolic activity associated with the right lobe of the thyroid  gland.  Small hypermetabolic right axillary node favored reactive. 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  Gastric wall thickening slightly improved. Cycle 7 oxaliplatin , zolbetuximab 05/05/2024, Fulphila  Cycle 8 zolbetuximab 05/20/2024, oxaliplatin  held due to neuropathy, no Fulphila  Cycle 9 oxaliplatin /zolbetuximab 06/03/2024, Fulphila  Cycle 10 oxaliplatin /zolbetuximab 06/17/2024, Fulphila  Cycle 11 oxaliplatin /zolbetuximab 06/30/2024, Fulphila , oxaliplatin  dose reduced Cycle 12 zolbetuximab 07/15/2024, oxaliplatin  held secondary to neuropathy Cycle 13 zolbetuximab 07/29/2024, oxaliplatin  held secondary to neuropathy Cycle 14 zolbetuximab 08/26/2024, oxaliplatin  held secondary to neuropathy 09/01/2024 CTs-mild distal gastric wall thickening without discrete mass.  No metastatic disease. Cycle 15 zolbetuximab 09/09/2024, oxaliplatin  held due to neuropathy Cycle 16 zolbetuximab 09/30/2024, oxaliplatin  held due to neuropathy Cycle 17 zolbetuximab 10/21/2024, oxaliplatin  held due to neuropathy 11/03/2024 CT abdomen/pelvis: Diffuse mild wall thickening of the distal gastric body and antrum-stable, mild increase in a right gastric  lymph node small volume ascites without omental nodularity, proximal jejunal mural thickening 11/04/2024 upper GI: Narrowing of the gastric antrum, normal gastric emptying, dilated duodenum and proximal jejunum without evidence of a bowel obstruction 11/18/2024 upper endoscopy: Tumor in the gastric body and antrum with antral narrowing,  dilated duodenum without evidence of mass or obstruction, esophagitis: Biopsy of the gastric mass-poorly differentiated adenocarcinoma with focal signet ring morphology, esophagus biopsy: Squamous mucosa with focal ulceration Cycle 1 weekly cisplatin  11/26/2024 Cycle 2 weekly cisplatin  12/07/2024 Early satiety, postprandial abdominal pain, weight loss secondary to #1 History of bilateral lower extremity DVT 2021-anticoagulation discontinued due to massive retroperitoneal bleed, IVC filter placed 08/24/2020 Hospitalized with acute respiratory failure due to COVID-19 08/06/2020 - 09/04/2020 History of massive retroperitoneal bleed secondary to anticoagulation September 2021 Thyroid  ultrasound 01/13/2024-no abnormal nodule identified in the right lobe.  1.1 cm thyroid  isthmus nodule meets criteria for 1 year follow-up ultrasound. Admission 02/01/2024 with increased abdominal pain/ascites Hospitalization with altered mental status 02/05/2024 through 02/13/2024; question rare case of 5-FU psychosis.  Improved 02/16/2024.  Mental status at baseline 02/23/2024. Neutropenia 02/16/2024 Mucositis 02/16/2024.  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  Persistent cold sensitivity 05/20/2024, oxaliplatin  held.  Cold sensitivity resolved 06/03/2024, oxaliplatin  resumed.  Mild loss of vibratory sense, oxaliplatin  dose reduced 06/29/2024 Admission 11/28/2024 with intractable nausea and vomiting 11/27/2024 CT Abdo/pelvis:  Bilateral lower lobe aspiration/pneumonia, thick-walled gastric body/antrum, dilated proximal small bowel, small volume pelvic ascites 11/30/2024 EGD: Malignant appearing severe stenosis at the gastric antrum-stent placed, dilated duodenum with retained fluid   Henry May has metastatic gastric cancer.  There is evidence of disease progression with persistent/progressive tumor in the stomach and intractable nausea/vomiting.  The omental disease on presentation last year remains significantly improved, by CT criteria.  He responded well to initial treatment with oxaliplatin  based chemotherapy.  He has neuropathy symptoms.  He began a trial of salvage therapy with cisplatin  on 11/26/2024.  He is now admitted with intractable nausea/vomiting, potentially related to a small bowel obstruction, ileus from the carcinomatosis, in addition to persistent tumor in the stomach.   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  There was retained fluid in the stomach and duodenum.  A gastric antral stent was placed.  He continues to have nausea and vomiting of liquids despite placement of the gastric antral stent.  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  We discussed treatment options.  He would like to proceed with treatment of the cancer.  He will complete another cycle of weekly cisplatin  today..  The plan is to continue home TPN cycled at night.  He can be discharged in the next few days with home TPN.  We will continue outpatient chemotherapy.  We would consider third line treatment with paclitaxel/ramucirumab if he does not improve with cisplatin .    Recommendations: Change antiemetics to oral dosing (lorazepam  and Phenergan ) Consider discontinuing octreotide  and Decadron , not clear he is benefiting from these Week #2 cisplatin  today Ask pharmacy to cycle the TPN at night Arrange for home TPN Outpatient  follow-up will be scheduled at the Cancer center for the week of 12/13/2024   LOS: 9 days   Arley Hof, MD   12/07/2024, 7:17 AM

## 2024-12-07 NOTE — Progress Notes (Signed)
 PHARMACY - TOTAL PARENTERAL NUTRITION CONSULT NOTE   Indication: intolerance to enteral feeds  Patient Measurements: Height: 6' (182.9 cm) Weight: 76.7 kg (169 lb 1.5 oz) IBW/kg (Calculated) : 77.6 TPN AdjBW (KG): 70 Body mass index is 22.93 kg/m. Usual Weight:   Assessment: 58 yoM admitted on 12/13 with intractable nausea and vomiting, dilated small bowel loops, and coffee-ground emesis. 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  He underwent EGD 12/16 showing esophagitis,gastric stensosis,  stent placed, impaired peristalsis.  Trial NG tube clamping and CLD. Pharmacy is consulted to dose TPN on 12/20 for intolerance to enteral feeding.    Glucose / Insulin : No hx DM, no PTA meds. - on Dexamethasone  2mg  IV q12h - on sSSI q6h (used 3 units since TPN rate increased to 90 ml/hr yesterday; est~ 6 units in 24 hrs) - CBGs (goal <150): wnl Electrolytes: Na improved to 135; other lytes WNL   Renal: SCr < 1, BUN 23 Hepatic: AST / ALT elevated but down 48/189 -  Tbili WNL and alk phos slightly elevated -  Albumin  3.2 Intake / Output; MIVF: not on IVF - I/O: +1597 mL  - LBM: 12/20 GI Imaging: - 12/18 Abd xray: No bowel dilatation or evidence of obstruction. Retained gastric fluid. 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 Prosthesis placed. Duodenal dilation deformity  GI Surgeries / Procedures:  - 12/16 EGD: esophagitis  GI meds:  Octreotide  50 mcg q8h, Protonix  40mg  q12h  Central access: Implanted Port (01/22/24) TPN start date:  12/20   Nutritional Goals: Goal TPN rate is 90 mL/hr and provides 108 g of protein and 2182 kcals per day)  - 12/21: NGT came out   RD Assessment: Estimated Needs Total Energy Estimated Needs: 2100-2450 kcals Total Protein Estimated Needs: 105-120 grams Total Fluid Estimated Needs: >/= 2.1L  Current Nutrition:  Diet: Clear liquids Tube feeds: Mallie Pinion - not taking  Plan:   Today, at 1800: -  Will  cycle TPN to 18 hrs - Electrolytes in TPN:  Continue Na 150 mEq/L K 50mEq/L Ca 5mEq/L Mg 48mEq/L Phos 15mmol/L Change Cl:Ac to 1:1 - Add standard MVI and trace elements to TPN -  Sensitive SSI four times a day at 0300, 1300, 1600, 2000-->  CBGs: 2 hours past cyclic TPN start + 1 hour past cyclic TPN d/c + middle of TPN  infusion + while off TPN (total 4 CBGs/24 hour period) - Monitor LFTs closely - Monitor TPN labs on Mon/Thurs - Cmet, phos, mag on 12/24   Iantha Batch, PharmD, BCPS 12/07/2024 7:03 AM

## 2024-12-08 ENCOUNTER — Encounter: Payer: Self-pay | Admitting: *Deleted

## 2024-12-08 DIAGNOSIS — K56600 Partial intestinal obstruction, unspecified as to cause: Secondary | ICD-10-CM | POA: Diagnosis not present

## 2024-12-08 DIAGNOSIS — K920 Hematemesis: Secondary | ICD-10-CM | POA: Diagnosis not present

## 2024-12-08 DIAGNOSIS — T797XXA Traumatic subcutaneous emphysema, initial encounter: Secondary | ICD-10-CM | POA: Diagnosis not present

## 2024-12-08 DIAGNOSIS — C163 Malignant neoplasm of pyloric antrum: Secondary | ICD-10-CM | POA: Diagnosis not present

## 2024-12-08 DIAGNOSIS — Z86718 Personal history of other venous thrombosis and embolism: Secondary | ICD-10-CM | POA: Diagnosis not present

## 2024-12-08 DIAGNOSIS — K311 Adult hypertrophic pyloric stenosis: Secondary | ICD-10-CM | POA: Diagnosis not present

## 2024-12-08 DIAGNOSIS — E43 Unspecified severe protein-calorie malnutrition: Secondary | ICD-10-CM

## 2024-12-08 DIAGNOSIS — R710 Precipitous drop in hematocrit: Secondary | ICD-10-CM | POA: Diagnosis not present

## 2024-12-08 DIAGNOSIS — E119 Type 2 diabetes mellitus without complications: Secondary | ICD-10-CM | POA: Diagnosis not present

## 2024-12-08 DIAGNOSIS — R112 Nausea with vomiting, unspecified: Secondary | ICD-10-CM | POA: Diagnosis not present

## 2024-12-08 DIAGNOSIS — R509 Fever, unspecified: Secondary | ICD-10-CM | POA: Diagnosis not present

## 2024-12-08 LAB — COMPREHENSIVE METABOLIC PANEL WITH GFR
ALT: 116 U/L — ABNORMAL HIGH (ref 0–44)
AST: 26 U/L (ref 15–41)
Albumin: 3 g/dL — ABNORMAL LOW (ref 3.5–5.0)
Alkaline Phosphatase: 171 U/L — ABNORMAL HIGH (ref 38–126)
Anion gap: 10 (ref 5–15)
BUN: 24 mg/dL — ABNORMAL HIGH (ref 6–20)
CO2: 21 mmol/L — ABNORMAL LOW (ref 22–32)
Calcium: 8.6 mg/dL — ABNORMAL LOW (ref 8.9–10.3)
Chloride: 103 mmol/L (ref 98–111)
Creatinine, Ser: 0.61 mg/dL (ref 0.61–1.24)
GFR, Estimated: >60 mL/min
Glucose, Bld: 190 mg/dL — ABNORMAL HIGH (ref 70–99)
Potassium: 5 mmol/L (ref 3.5–5.1)
Sodium: 134 mmol/L — ABNORMAL LOW (ref 135–145)
Total Bilirubin: 0.5 mg/dL (ref 0.0–1.2)
Total Protein: 6 g/dL — ABNORMAL LOW (ref 6.5–8.1)

## 2024-12-08 LAB — PHOSPHORUS: Phosphorus: 2.3 mg/dL — ABNORMAL LOW (ref 2.5–4.6)

## 2024-12-08 LAB — GLUCOSE, CAPILLARY
Glucose-Capillary: 103 mg/dL — ABNORMAL HIGH (ref 70–99)
Glucose-Capillary: 138 mg/dL — ABNORMAL HIGH (ref 70–99)
Glucose-Capillary: 158 mg/dL — ABNORMAL HIGH (ref 70–99)
Glucose-Capillary: 159 mg/dL — ABNORMAL HIGH (ref 70–99)
Glucose-Capillary: 161 mg/dL — ABNORMAL HIGH (ref 70–99)

## 2024-12-08 LAB — MAGNESIUM: Magnesium: 2.4 mg/dL (ref 1.7–2.4)

## 2024-12-08 MED ORDER — INSULIN ASPART 100 UNIT/ML IJ SOLN
0.0000 [IU] | Freq: Four times a day (QID) | INTRAMUSCULAR | Status: DC
  Administered 2024-12-08 – 2024-12-09 (×2): 2 [IU] via SUBCUTANEOUS
  Administered 2024-12-09: 3 [IU] via SUBCUTANEOUS
  Administered 2024-12-10: 5 [IU] via SUBCUTANEOUS
  Administered 2024-12-10: 2 [IU] via SUBCUTANEOUS
  Filled 2024-12-08: qty 3
  Filled 2024-12-08 (×2): qty 2
  Filled 2024-12-08: qty 5
  Filled 2024-12-08: qty 2

## 2024-12-08 MED ORDER — TRAVASOL 10 % IV SOLN
INTRAVENOUS | Status: AC
  Filled 2024-12-08: qty 1080

## 2024-12-08 MED ORDER — SODIUM PHOSPHATES 45 MMOLE/15ML IV SOLN
15.0000 mmol | Freq: Once | INTRAVENOUS | Status: AC
  Administered 2024-12-08: 15 mmol via INTRAVENOUS
  Filled 2024-12-08: qty 5

## 2024-12-08 NOTE — Progress Notes (Signed)
 IP PROGRESS NOTE  Subjective:   Henry May completed another cycle of cisplatin  yesterday.  No acute nausea following chemotherapy.  He reports relief of nausea with sublingual Ativan  and Phenergan  suppositories.  No flatus or bowel movement.  No pain.  He is ambulating.      Objective: Vital signs in last 24 hours: Blood pressure 115/75, pulse 72, temperature 98.3 F (36.8 C), temperature source Oral, resp. rate 20, height 6' (1.829 m), weight 164 lb 10.9 oz (74.7 kg), SpO2 95%.  Intake/Output from previous day: 12/23 0701 - 12/24 0700 In: 1113.4 [I.V.:514.4; IV Piggyback:599] Out: 500 [Urine:500]  Physical Exam:  HEENT: No thrush Lungs: Clear bilaterally Cardiac: Regular rate and rhythm  Abdomen: Soft, nontender, no hepatomegaly Extremities: No leg edema   Portacath/PICC-without erythema  Lab Results: Recent Labs    12/07/24 0739  WBC 3.8*  HGB 12.0*  HCT 35.6*  PLT 292     BMET Recent Labs    12/07/24 0739 12/08/24 0412  NA 135 134*  K 4.4 5.0  CL 102 103  CO2 23 21*  GLUCOSE 144* 190*  BUN 23* 24*  CREATININE 0.67 0.61  CALCIUM  8.6* 8.6*    Lab Results  Component Value Date   CEA 3.57 01/15/2024      Medications: I have reviewed the patient's current medications.  Assessment/Plan: Gastric cancer 11/21/2023 EGD-gastric body with infiltrative looking lesion; biopsy shows involvement of a hypercellular lesion that suggests diffuse carcinoma by morphology CTs abdomen/pelvis 11/28/2023-diffuse fatty infiltration of the liver; gastric wall thickening; lymph nodes up to 6 mm in diameter near the stomach wall. 12/31/2023 upper endoscopy-large diffuse friable infiltrative polypoid and ulcerated circumferential mass found in the gastric body.  Scope was passed beyond the mass with normal antrum/pylorus.  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  Negative for HER2 (1+);  mismatch repair protein IHC normal; PD-L1 CPS 0%; CLDN18 positive: 90% of tumor cells with 2+/3+ membrane staining PET scan 01/07/2024-circumferential hypermetabolic gastric mucosal thickening.  Ill-defined hypermetabolic lymph nodes in the gastrohepatic ligament.  Intense hypermetabolic activity associated with the right lobe of the thyroid  gland.  Small hypermetabolic right axillary node favored reactive. 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  Gastric wall thickening slightly improved. Cycle 7 oxaliplatin , zolbetuximab 05/05/2024, Fulphila  Cycle 8 zolbetuximab 05/20/2024, oxaliplatin  held due to neuropathy, no Fulphila  Cycle 9 oxaliplatin /zolbetuximab 06/03/2024, Fulphila  Cycle 10 oxaliplatin /zolbetuximab 06/17/2024, Fulphila  Cycle 11 oxaliplatin /zolbetuximab 06/30/2024, Fulphila , oxaliplatin  dose reduced Cycle 12 zolbetuximab 07/15/2024, oxaliplatin  held secondary to neuropathy Cycle 13 zolbetuximab 07/29/2024, oxaliplatin  held secondary to neuropathy Cycle 14 zolbetuximab 08/26/2024, oxaliplatin  held secondary to neuropathy 09/01/2024 CTs-mild distal gastric wall thickening without discrete mass.  No metastatic disease. Cycle 15 zolbetuximab 09/09/2024, oxaliplatin  held due to neuropathy Cycle 16 zolbetuximab 09/30/2024, oxaliplatin  held due to neuropathy Cycle 17 zolbetuximab 10/21/2024, oxaliplatin  held due to  neuropathy 11/03/2024 CT abdomen/pelvis: Diffuse mild wall thickening of the distal gastric body and antrum-stable, mild increase in a right gastric lymph node small volume ascites without omental nodularity, proximal jejunal mural thickening 11/04/2024 upper GI: Narrowing  of the gastric antrum, normal gastric emptying, dilated duodenum and proximal jejunum without evidence of a bowel obstruction 11/18/2024 upper endoscopy: Tumor in the gastric body and antrum with antral narrowing, dilated duodenum without evidence of mass or obstruction, esophagitis: Biopsy of the gastric mass-poorly differentiated adenocarcinoma with focal signet ring morphology, esophagus biopsy: Squamous mucosa with focal ulceration Cycle 1 weekly cisplatin  11/26/2024 Cycle 2 weekly cisplatin  12/07/2024 Early satiety, postprandial abdominal pain, weight loss secondary to #1 History of bilateral lower extremity DVT 2021-anticoagulation discontinued due to massive retroperitoneal bleed, IVC filter placed 08/24/2020 Hospitalized with acute respiratory failure due to COVID-19 08/06/2020 - 09/04/2020 History of massive retroperitoneal bleed secondary to anticoagulation September 2021 Thyroid  ultrasound 01/13/2024-no abnormal nodule identified in the right lobe.  1.1 cm thyroid  isthmus nodule meets criteria for 1 year follow-up ultrasound. Admission 02/01/2024 with increased abdominal pain/ascites Hospitalization with altered mental status 02/05/2024 through 02/13/2024; question rare case of 5-FU psychosis.  Improved 02/16/2024.  Mental status at baseline 02/23/2024. Neutropenia 02/16/2024 Mucositis 02/16/2024.  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  Persistent cold sensitivity 05/20/2024, oxaliplatin  held.  Cold sensitivity resolved 06/03/2024, oxaliplatin  resumed.  Mild loss  of vibratory sense, oxaliplatin  dose reduced 06/29/2024 Admission 11/28/2024 with intractable nausea and vomiting 11/27/2024 CT Abdo/pelvis: Bilateral lower lobe aspiration/pneumonia, thick-walled gastric body/antrum, dilated proximal small bowel, small volume pelvic ascites 11/30/2024 EGD: Malignant appearing severe stenosis at the gastric antrum-stent placed, dilated duodenum with retained fluid   Henry May has metastatic gastric cancer.  There is evidence of disease progression with persistent/progressive tumor in the stomach and intractable nausea/vomiting.  The omental disease on presentation last year remains significantly improved, by CT criteria.  He responded well to initial treatment with oxaliplatin  based chemotherapy.  He has neuropathy symptoms.  He began a trial of salvage therapy with cisplatin  on 11/26/2024.  He completed cycle 2 on 12/07/2024.  He is now admitted with intractable nausea/vomiting, potentially related to a small bowel obstruction, ileus from the carcinomatosis, in addition to persistent tumor in the stomach.   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  There was retained fluid in the stomach and duodenum.  A gastric antral stent was placed.  He continues to have nausea and vomiting of liquids despite placement of the gastric antral stent.  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 Nausea was improved yesterday with sublingual Ativan  and a Phenergan  suppository.   The plan is to continue home TPN cycled at night.  He appears stable for discharge when home TPN is arranged.   Recommendations: Continue Ativan  and Phenergan  for nausea Arrange for home TPN to be cycled at night Outpatient follow-up will be scheduled at the Cancer center for the week of 12/13/2024 Please call oncology as needed   LOS: 10 days   Arley Hof, MD   12/08/2024, 7:44 AM

## 2024-12-08 NOTE — Progress Notes (Signed)
 " PROGRESS NOTE  Henry May FMW:[id number] DOB: [birth year]   PCP: [name], DO  Patient is from: Home.  Lives with wife and daughter.  DOA: 11/27/2024 LOS: 10  Chief complaints No chief complaint on file.    Brief Narrative / Interim history: 58 year old M with PMH of DVT with IVC filter, gastric cancer on chemo and esophagitis presented to [facility] due to nausea, vomiting and coffee-ground emesis 2 days after he started new chemotherapy, and admitted for intractable nausea and vomiting, dilated small bowel loops and coffee-ground emesis.  Recent EGD on 12/4 with LA grade D esophagitis without bleeding, and large infiltrative, sessile and ulcerated circumferential mass with contact of bleeding in gastric body and antrum with antral narrowing and luminal diameter of approximately 1.5 cm  In ED, stable vitals.  CBC and CMP without significant finding.  Lipase 171.  CT abdomen and pelvis showed dilated proximal small bowel suggesting SBO but without discrete transition point, thick walled gastric body/antrum without definite focal mass, mild nodularity along the pylorus, scarring with superimposed patchy peribronchovascular nodularity in the bilateral lower lobes right more than left suggesting mild aspiration or pneumonia.  Patient was started on antiemetics, IV fluid, PPI and admitted for further care.  Oncology, surgery, GI and palliative medicine consulted.  Patient underwent EGD and gastric stent placement on 12/16.  Was unable to tolerate diet.  Started on TPN.  Plan for discharge with TPN once logistic in place.  Pharmacy and [facility] working on this.    Subjective: Seen and examined earlier this morning.  No major events overnight or this morning.  No nausea or vomiting last night of this morning.  Denies abdominal pain.  Daughter, wife and other family member at the bedside.  Assessment and plan: Intractable nausea and vomiting due to gastric ulcer, gastric luminal narrowing  and esophagitis: CT with dilated small bowel without transition point.  -S/p EGD and gastric stenting in 12/16. -Remains n.p.o. except ice chips and sips with meds. -Continue TPN per pharmacy.  TOC and pharmacy working on home TPN. -Continue p.o. and rectal Phenergan  and Ativan  as needed. -Continue PPI. -Octreotide  and Decadron  discontinued. -GI signed off.  Coffee ground emesis: Due to gastric cancer and esophagitis.  H&H stable. Recent Labs    10/21/24 0755 11/04/24 1620 11/09/24 0910 11/24/24 1330 11/27/24 1939 11/28/24 1536 11/29/24 0250 11/30/24 0441 12/01/24 0006 12/07/24 0739  HGB 14.8 13.6 14.1 14.6 14.5 13.3 12.7* 11.7* 11.0* 12.0*  - Continue monitoring. - Continue PPI. - GI signed off.   Dilated small bowel without transition point:  -S/p gastric stenting as above.  Gastric cancer: Recently restarted on chemo.  Followed by Dr. Zorita.  -Oncology and palliative medicine on board.  Possible aspiration pneumonitis/pneumonia: No respiratory symptoms.  CXR showed patchy airspace opacities in RML.  CT abdomen and pelvis with bibasilar infiltrate. - Completed 7 days of appropriate antibiotics.   Diet controlled diabetes: A1c 5.7%.  Not on medication at home. -Continue SSI sensitive.   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  Improved.  Insomnia - Continue Ativan  and Phenergan .  Severe malnutrition Body mass index is 22.34 kg/m. Nutrition Problem: Severe Malnutrition Etiology: chronic illness (gastric cancer) Signs/Symptoms: severe fat depletion, severe muscle depletion, percent weight loss (24% weight loss in less than 3 months) Percent weight loss: 24 % (in < 3 months) Interventions: Refer to RD note for recommendations   DVT prophylaxis:  SCDs Start: 11/28/24  1042  Code Status: Full code Family Communication: Updated patient's wife and daughter at bedside Level of care: Progressive Status  is: Inpatient Remains inpatient appropriate because: Intractable nausea and vomiting   Final disposition: Likely home    55 minutes with more than 50% spent in reviewing records, counseling patient/family and coordinating care.  Consultants:  Gastroenterology General Surgery Oncology Palliative medicine  Procedures: 12/16-EGD and gastric stenting  Microbiology summarized: None  Objective: Vitals:   12/07/24 1447 12/07/24 2127 12/08/24 0500 12/08/24 0511  BP: 110/73 119/78  115/75  Pulse: 75 91  72  Resp: 17 18  20   Temp: 99 F (37.2 C) 97.8 F (36.6 C)  98.3 F (36.8 C)  TempSrc: Oral Oral  Oral  SpO2: 95% 90%  95%  Weight:   74.7 kg   Height:        Examination:  GENERAL: No apparent distress.  Nontoxic. HEENT: MMM.  Vision and hearing grossly intact.  NECK: Supple.  No apparent JVD.  RESP:  No IWOB.  Fair aeration bilaterally. CVS:  RRR. Heart sounds normal.  ABD/GI/GU: BS+. Abd soft, NTND.  MSK/EXT:  Moves extremities. No apparent deformity. No edema.  SKIN: no apparent skin lesion or wound NEURO: AA.  Oriented appropriately.  No apparent focal neuro deficit. PSYCH: Calm. Normal affect.   Sch Meds:  Scheduled Meds:  Chlorhexidine  Gluconate Cloth  6 each Topical Daily   insulin  aspart  0-9 Units Subcutaneous Q6H   mouth rinse  15 mL Mouth Rinse 4 times per day   pantoprazole  (PROTONIX ) IV  40 mg Intravenous Q12H   Continuous Infusions:  sodium phosphate  15 mmol in sodium chloride  0.9 % 250 mL infusion 15 mmol (12/08/24 1138)   TPN CYCLIC-ADULT (ION)     PRN Meds:.acetaminophen  **OR** acetaminophen , alum & mag hydroxide-simeth, hydrALAZINE , LORazepam , menthol , mouth rinse, phenol, promethazine , promethazine , sodium chloride  flush  Antimicrobials: Anti-infectives (From admission, onward)    Start     Dose/Rate Route Frequency Ordered Stop   11/28/24 1200  Ampicillin -Sulbactam (UNASYN ) 3 g in sodium chloride  0.9 % 100 mL IVPB  Status:  Discontinued         3 g 200 mL/hr over 30 Minutes Intravenous Every 6 hours 11/28/24 1048 12/05/24 0909   11/28/24 0045  Ampicillin -Sulbactam (UNASYN ) 3 g in sodium chloride  0.9 % 100 mL IVPB        3 g 200 mL/hr over 30 Minutes Intravenous  Once 11/28/24 0042 11/28/24 0134        I have personally reviewed the following labs and images: CBC: Recent Labs  Lab 12/02/24 1136 12/07/24 0739  WBC  --  3.8*  NEUTROABS 4.4 2.6  HGB  --  12.0*  HCT  --  35.6*  MCV  --  89.7  PLT  --  292   BMP &GFR Recent Labs  Lab 12/04/24 1102 12/06/24 0748 12/07/24 0739 12/08/24 0412  NA 134* 131* 135 134*  K 4.3 4.1 4.4 5.0  CL 95* 97* 102 103  CO2 26 24 23  21*  GLUCOSE 95 150* 144* 190*  BUN 22* 21* 23* 24*  CREATININE 0.92 0.65 0.67 0.61  CALCIUM  8.7* 8.6* 8.6* 8.6*  MG 2.2 2.3 2.2 2.4  PHOS 2.7 2.7 3.1 2.3*   Estimated Creatinine Clearance: 103.8 mL/min (by C-G formula based on SCr of 0.61 mg/dL). Liver & Pancreas: Recent Labs  Lab 12/04/24 1102 12/06/24 0748 12/08/24 0412  AST 111* 48* 26  ALT 275* 189* 116*  ALKPHOS 172*  169* 171*  BILITOT 0.9 0.6 0.5  PROT 6.3* 6.3* 6.0*  ALBUMIN  3.5 3.2* 3.0*   No results for input(s): LIPASE, AMYLASE in the last 168 hours.  No results for input(s): AMMONIA in the last 168 hours. Diabetic: No results for input(s): HGBA1C in the last 72 hours.  Recent Labs  Lab 12/07/24 1059 12/07/24 1713 12/07/24 2110 12/08/24 0512 12/08/24 1205  GLUCAP 151* 149* 172* 158* 103*   Cardiac Enzymes: No results for input(s): CKTOTAL, CKMB, CKMBINDEX, TROPONINI in the last 168 hours. No results for input(s): PROBNP in the last 8760 hours. Coagulation Profile: No results for input(s): INR, PROTIME in the last 168 hours.  Thyroid  Function Tests: No results for input(s): TSH, T4TOTAL, FREET4, T3FREE, THYROIDAB in the last 72 hours. Lipid Profile: Recent Labs    12/06/24 0748  TRIG 164*   Anemia Panel: No results for  input(s): VITAMINB12, FOLATE, FERRITIN, TIBC, IRON, RETICCTPCT in the last 72 hours. Urine analysis:    Component Value Date/Time   COLORURINE YELLOW 11/04/2024 1812   APPEARANCEUR CLEAR 11/04/2024 1812   LABSPEC 1.025 11/04/2024 1812   PHURINE 5.5 11/04/2024 1812   GLUCOSEU NEGATIVE 11/04/2024 1812   HGBUR NEGATIVE 11/04/2024 1812   BILIRUBINUR NEGATIVE 11/04/2024 1812   KETONESUR 15 (A) 11/04/2024 1812   PROTEINUR TRACE (A) 11/04/2024 1812   NITRITE NEGATIVE 11/04/2024 1812   LEUKOCYTESUR NEGATIVE 11/04/2024 1812   Sepsis Labs: Invalid input(s): PROCALCITONIN, LACTICIDVEN  Microbiology: No results found for this or any previous visit (from the past 240 hours).  Radiology Studies: No results found.     Malikiah Debarr T. Jane Birkel [facility]  If 7PM-7AM, please contact night-coverage [email or web address] 12/08/2024, 2:23 PM   "

## 2024-12-08 NOTE — TOC Progression Note (Signed)
 Transition of Care [facility]) - Progression Note    Patient Details  Name: Joshuwa Vecchio MRN: [id number] Date of Birth: 1966/04/09  [facility] CM/SW Contact  Doneta Glenys DASEN, RN Phone Number: 12/08/2024, 11:41 AM  Clinical Narrative:    CM spoke with Marlo Lango still waiting on insurance to approve home TPN. If approved the earliest TPN can be set up is Friday. With the holiday may not get approval until Monday (12/29). Pam will meet with patient later today.  CM updated provider.  Barriers to Discharge: Continued Medical Work up               Expected Discharge Plan and Services                                               Social Drivers of Health (SDOH) Interventions SDOH Screenings   Food Insecurity: Patient Declined (11/28/2024)  Housing: Unknown (11/28/2024)  Transportation Needs: Patient Declined (11/28/2024)  Utilities: Patient Declined (11/28/2024)  Depression (PHQ2-9): Medium Risk (11/26/2024)  Social Connections: Unknown (04/30/2022)   Received from [facility]  Tobacco Use: Medium Risk (11/30/2024)    Readmission Risk Interventions    12/06/2024   10:13 AM 02/07/2024    8:40 AM  Readmission Risk Prevention Plan  Transportation Screening Complete Complete  HRI or Home Care Consult Complete Complete  Social Work Consult for Recovery Care Planning/Counseling Complete Complete  Palliative Care Screening Not Applicable Not Applicable  Medication Review Oceanographer) Complete Complete

## 2024-12-08 NOTE — Progress Notes (Addendum)
 PHARMACY - TOTAL PARENTERAL NUTRITION CONSULT NOTE   Indication: intolerance to enteral feeds  Patient Measurements: Height: 6' (182.9 cm) Weight: 74.7 kg (164 lb 10.9 oz) IBW/kg (Calculated) : 77.6 TPN AdjBW (KG): 70 Body mass index is 22.34 kg/m. Usual Weight:   Assessment: 58 yoM admitted on 12/13 with intractable nausea and vomiting, dilated small bowel loops, and coffee-ground emesis. 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  He underwent EGD 12/16 showing esophagitis,gastric stensosis,  stent placed, impaired peristalsis.  Trial NG tube clamping and CLD. Pharmacy is consulted to dose TPN on 12/20 for intolerance to enteral feeding.    Glucose / Insulin : No hx DM, no PTA meds. -  Dexamethasone  2mg  IV q12h- dc'd 12/24 am  - dexamethasone  10 mg IV given 12/23 as premed for chemo - on sSSI q6h, has used 2 units/24 hours, CBGs 149 - 158, serum glucose 190.  - CBGs (goal <150) Electrolytes: Na 134 (max in TPN), K 5  , phos 2.3, mag 2.4, CoCa WNL Renal: SCr < 1, BUN 24 Hepatic: AST- down to WNL, ALT elevated but down trending -  Tbili WNL and alk phos slightly elevated -  Albumin  3 Trig 165 12/23 Intake / Output; MIVF: not on IVF - I/O: +613.4 mL  - LBM: 12/20 GI Imaging: - 12/18 Abd xray: No bowel dilatation or evidence of obstruction. Retained gastric fluid. 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 Prosthesis placed. Duodenal dilation deformity  GI Surgeries / Procedures:  - 12/16 EGD: esophagitis  GI meds:  Protonix  40mg  IV  q12h,  octreotide  50 sq q8hrs> DC'd 12/24 Phenergan  12.5 IV q6h ATC> DC'd 12/24 Decadron  2 gm IV q12hrs> DC'd 12/24  Central access: Implanted Port (01/22/24) TPN start date:  12/20   Nutritional Goals: Goal TPN rate is 90 mL/hr and provides 108 g of protein and 2182 kcals per day)  RD Assessment: Estimated Needs Total Energy Estimated Needs: 2100-2450 kcals Total Protein Estimated Needs: 105-120  grams Total Fluid Estimated Needs: >/= 2.1L  Current Nutrition:  Diet: NPO, Cyclic TPN  Plan:  Now- Naphos 15 mMol IV x 1  Pt ready for DC per TRH & Onc but insurance has not approved TPN yet Plan: -  at 1800 change TPN to cycle over 12 hrs - Electrolytes in TPN:  Continue Na 150 mEq/L Decrease K 40 mEq/L Ca 28mEq/L Decrease Mg 3 mEq/L Phos 18mmol/L Change Cl:Ac to 2:1 - Add standard MVI and trace elements to TPN   Sensitive SSI four times a day at 0000, 0700, 1200,  2000-->  CBGs: 2 hours past cyclic TPN start + 1 hour past cyclic TPN d/c + middle of TPN  infusion + while off TPN (total 4 CBGs/24 hour period) - Monitor LFTs closely - Monitor TPN labs on Mon/Thurs - Cmet, phos, mag on 12/25   Rosaline IVAR Edison, Pharm.D Use secure chat for questions 12/08/2024 10:37 AM

## 2024-12-08 NOTE — Progress Notes (Signed)
 Oncology Discharge Planning Note  [facility] Address: [address], [place], [place] [address] Hours of Operation:  [place], Monday - Friday  Clinic Contact Information:  [phone]  Oncology Care Team: Medical Oncologist:  Cloretta  Patient Details: Name:  Henry May, Henry May MRN:   [id number] DOB:   03/29/1966 Reason for Current Admission: @PPROB @  Discharge Planning Narrative: Notification of admission received by inpatient team for [facility].  Discharge follow-up appointments for oncology are current and available on the AVS and MyChart.   Upon discharge from the hospital, hematology/oncology's post discharge plan of care for the outpatient setting is:   January 2,2026 at 10:20 with Dr Cloretta Hire Cathrine will be called within two business days after discharge to review hematology/oncology's plan of care for full understanding.    Outpatient Oncology Specific Care Only: Oncology appointment transportation needs addressed?:  no Oncology medication management for symptom management addressed?:  yes Chemo Alert Card reviewed?:  yes Immunotherapy Alert Card reviewed?:  not applicable

## 2024-12-09 ENCOUNTER — Inpatient Hospital Stay (HOSPITAL_COMMUNITY)

## 2024-12-09 ENCOUNTER — Other Ambulatory Visit: Payer: Self-pay

## 2024-12-09 DIAGNOSIS — C163 Malignant neoplasm of pyloric antrum: Secondary | ICD-10-CM | POA: Diagnosis not present

## 2024-12-09 DIAGNOSIS — Z86718 Personal history of other venous thrombosis and embolism: Secondary | ICD-10-CM | POA: Diagnosis not present

## 2024-12-09 DIAGNOSIS — E43 Unspecified severe protein-calorie malnutrition: Secondary | ICD-10-CM | POA: Diagnosis not present

## 2024-12-09 DIAGNOSIS — K311 Adult hypertrophic pyloric stenosis: Secondary | ICD-10-CM | POA: Diagnosis not present

## 2024-12-09 LAB — COMPREHENSIVE METABOLIC PANEL WITH GFR
ALT: 99 U/L — ABNORMAL HIGH (ref 0–44)
AST: 31 U/L (ref 15–41)
Albumin: 3.2 g/dL — ABNORMAL LOW (ref 3.5–5.0)
Alkaline Phosphatase: 191 U/L — ABNORMAL HIGH (ref 38–126)
Anion gap: 12 (ref 5–15)
BUN: 38 mg/dL — ABNORMAL HIGH (ref 6–20)
CO2: 21 mmol/L — ABNORMAL LOW (ref 22–32)
Calcium: 8.9 mg/dL (ref 8.9–10.3)
Chloride: 107 mmol/L (ref 98–111)
Creatinine, Ser: 0.76 mg/dL (ref 0.61–1.24)
GFR, Estimated: >60 mL/min
Glucose, Bld: 113 mg/dL — ABNORMAL HIGH (ref 70–99)
Potassium: 4.6 mmol/L (ref 3.5–5.1)
Sodium: 140 mmol/L (ref 135–145)
Total Bilirubin: 0.7 mg/dL (ref 0.0–1.2)
Total Protein: 6.5 g/dL (ref 6.5–8.1)

## 2024-12-09 LAB — PHOSPHORUS: Phosphorus: 1.9 mg/dL — ABNORMAL LOW (ref 2.5–4.6)

## 2024-12-09 LAB — GLUCOSE, CAPILLARY
Glucose-Capillary: 101 mg/dL — ABNORMAL HIGH (ref 70–99)
Glucose-Capillary: 89 mg/dL (ref 70–99)
Glucose-Capillary: 215 mg/dL — ABNORMAL HIGH (ref 70–99)

## 2024-12-09 LAB — MAGNESIUM: Magnesium: 2.2 mg/dL (ref 1.7–2.4)

## 2024-12-09 MED ORDER — LABETALOL HCL 5 MG/ML IV SOLN: 10.0000 mg | INTRAVENOUS | Status: DC | PRN

## 2024-12-09 MED ORDER — SODIUM CHLORIDE 0.9 % IV SOLN
50.0000 ug/h | INTRAVENOUS | Status: DC
  Administered 2024-12-09 – 2024-12-10 (×3): 50 ug/h via INTRAVENOUS
  Filled 2024-12-09 (×4): qty 1

## 2024-12-09 MED ORDER — PROCHLORPERAZINE EDISYLATE 10 MG/2ML IJ SOLN
5.0000 mg | Freq: Once | INTRAMUSCULAR | Status: AC | PRN
  Administered 2024-12-09: 5 mg via INTRAVENOUS
  Filled 2024-12-09: qty 2

## 2024-12-09 MED ORDER — LORAZEPAM 2 MG/ML IJ SOLN
0.5000 mg | Freq: Four times a day (QID) | INTRAMUSCULAR | Status: DC | PRN
  Administered 2024-12-09 – 2024-12-10 (×2): 0.5 mg via INTRAVENOUS
  Filled 2024-12-09 (×2): qty 1

## 2024-12-09 MED ORDER — MORPHINE SULFATE (PF) 2 MG/ML IV SOLN
1.0000 mg | Freq: Once | INTRAVENOUS | Status: AC
  Administered 2024-12-09: 1 mg via INTRAVENOUS
  Filled 2024-12-09: qty 1

## 2024-12-09 MED ORDER — TRAVASOL 10 % IV SOLN
INTRAVENOUS | Status: AC
  Filled 2024-12-09: qty 1080

## 2024-12-09 MED ORDER — SODIUM PHOSPHATES 45 MMOLE/15ML IV SOLN
30.0000 mmol | Freq: Once | INTRAVENOUS | Status: AC
  Administered 2024-12-09: 30 mmol via INTRAVENOUS
  Filled 2024-12-09: qty 10

## 2024-12-09 MED ORDER — TRAVASOL 10 % IV SOLN: INTRAVENOUS | Status: DC

## 2024-12-09 MED ORDER — LACTATED RINGERS IV SOLN: INTRAVENOUS | Status: AC

## 2024-12-09 MED ORDER — SODIUM CHLORIDE 0.9 % IV SOLN
25.0000 mg | Freq: Four times a day (QID) | INTRAVENOUS | Status: DC | PRN
  Administered 2024-12-09 – 2024-12-18 (×23): 25 mg via INTRAVENOUS
  Filled 2024-12-09 (×4): qty 25
  Filled 2024-12-09: qty 1
  Filled 2024-12-09: qty 25
  Filled 2024-12-09: qty 1
  Filled 2024-12-09: qty 25

## 2024-12-09 NOTE — Progress Notes (Signed)
 " PROGRESS NOTE  Henry May FMW:[id number] DOB: 1966/08/16   PCP: [name], DO  Patient is from: Home.  Lives with wife and daughter.  DOA: 11/27/2024 LOS: 11  Chief complaints No chief complaint on file.    Brief Narrative / Interim history: 58 year old M with PMH of DVT with IVC filter, gastric cancer on chemo and esophagitis presented to [facility] due to nausea, vomiting and coffee-ground emesis 2 days after he started new chemotherapy, and admitted for intractable nausea and vomiting, dilated small bowel loops and coffee-ground emesis.  Recent EGD on 12/4 with LA grade D esophagitis without bleeding, and large infiltrative, sessile and ulcerated circumferential mass with contact of bleeding in gastric body and antrum with antral narrowing and luminal diameter of approximately 1.5 cm  In ED, stable vitals.  CBC and CMP without significant finding.  Lipase 171.  CT abdomen and pelvis showed dilated proximal small bowel suggesting SBO but without discrete transition point, thick walled gastric body/antrum without definite focal mass, mild nodularity along the pylorus, scarring with superimposed patchy peribronchovascular nodularity in the bilateral lower lobes right more than left suggesting mild aspiration or pneumonia.  Patient was started on antiemetics, IV fluid, PPI and admitted for further care.  Oncology, surgery, GI and palliative medicine consulted.  Patient underwent EGD and gastric stent placement on 12/16.  Was unable to tolerate diet.  Started on TPN.  Plan for discharge with TPN once logistic in place but nausea and vomiting gotten worse after stopping octreotide  and Decadron .    Subjective: Seen and examined earlier this morning.  Patient had frequent emesis last night.  Some blood-streaked emesis this morning.  Reports epigastric/lower chest pain from vomiting.  Feels weak today.  He was very tachycardic and tachypneic this morning.  Assessment and  plan: Intractable nausea and vomiting due to gastric cancer, gastric luminal narrowing and esophagitis: CT with dilated small bowel without transition point.  -S/p EGD and gastric stenting in 12/16. -Remains n.p.o. except ice chips and sips with meds. -Continue TPN per pharmacy.  TOC and pharmacy working on home TPN. -Change p.o. and rectal Phenergan  to IV -Resume octreotide  infusion. -LR at 75 cc an hour during the day.  TPN at night. -Continue PPI. -GI signed off.  Dilated small bowel without transition point:  -S/p gastric stenting as above.  Gastric cancer: Recently restarted on chemo.  Followed by Dr. Zorita.  -Oncology and palliative medicine on board. - Supportive care as above.  Coffee ground emesis: Due to gastric cancer and esophagitis.  H&H stable. Recent Labs    10/21/24 0755 11/04/24 1620 11/09/24 0910 11/24/24 1330 11/27/24 1939 11/28/24 1536 11/29/24 0250 11/30/24 0441 12/01/24 0006 12/07/24 0739  HGB 14.8 13.6 14.1 14.6 14.5 13.3 12.7* 11.7* 11.0* 12.0*  - Continue monitoring. - Continue PPI. - GI signed off.  Possible aspiration pneumonitis/pneumonia: No respiratory symptoms.  CXR showed patchy airspace opacities in RML.  CT abdomen and pelvis with bibasilar infiltrate. - Completed 7 days of appropriate antibiotics.   Diet controlled diabetes: A1c 5.7%.  Not on medication at home. -Continue SSI sensitive.   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  Improved.  Insomnia mainly due to intractable nausea and vomiting - IV Ativan  and Phenergan  as above.  Severe malnutrition Body mass index is 21.29 kg/m. Nutrition Problem: Severe Malnutrition Etiology: chronic illness (gastric cancer) Signs/Symptoms: severe fat depletion, severe muscle depletion, percent weight loss (24% weight loss in  less than 3 months) Percent weight loss: 24 % (in < 3 months) Interventions: Refer to RD note for  recommendations   DVT prophylaxis:  SCDs Start: 11/28/24 1042  Code Status: Full code Family Communication: Updated patient's wife at bedside. Level of care: Progressive Status is: Inpatient Remains inpatient appropriate because: Intractable nausea and vomiting   Final disposition: Likely home    55 minutes with more than 50% spent in reviewing records, counseling patient/family and coordinating care.  Consultants:  Gastroenterology General Surgery Oncology Palliative medicine  Procedures: 12/16-EGD and gastric stenting  Microbiology summarized: None  Objective: Vitals:   12/09/24 0500 12/09/24 0553 12/09/24 0649 12/09/24 1127  BP:  127/80 113/77 104/67  Pulse:  (!) 126 (!) 127 94  Resp:  (!) 34 (!) 32 16  Temp:  97.7 F (36.5 C) 98.5 F (36.9 C) 97.7 F (36.5 C)  TempSrc:  Oral Oral Oral  SpO2:  96% 94% 98%  Weight: 71.2 kg     Height:        Examination:  GENERAL: No apparent distress.  Nontoxic. HEENT: MMM.  Vision and hearing grossly intact.  NECK: Supple.  No apparent JVD.  RESP:  No IWOB.  Fair aeration bilaterally. CVS:  RRR. Heart sounds normal.  ABD/GI/GU: BS+. Abd soft, NTND.  MSK/EXT:  Moves extremities. No apparent deformity. No edema.  SKIN: no apparent skin lesion or wound NEURO: AA.  Oriented appropriately.  No apparent focal neuro deficit. PSYCH: Calm. Normal affect.   Sch Meds:  Scheduled Meds:  Chlorhexidine  Gluconate Cloth  6 each Topical Daily   insulin  aspart  0-9 Units Subcutaneous Q6H   mouth rinse  15 mL Mouth Rinse 4 times per day   pantoprazole  (PROTONIX ) IV  40 mg Intravenous Q12H   Continuous Infusions:  lactated ringers  100 mL/hr at 12/09/24 1050   octreotide  (SANDOSTATIN ) 500 mcg in sodium chloride  0.9 % 250 mL (2 mcg/mL) infusion 50 mcg/hr (12/09/24 1053)   promethazine  (PHENERGAN ) injection (IM or IVPB) 25 mg (12/09/24 1057)   sodium phosphate  30 mmol in sodium chloride  0.9 % 250 mL infusion 30 mmol (12/09/24 0954)    TPN CYCLIC-ADULT (ION)     PRN Meds:.acetaminophen  **OR** acetaminophen , alum & mag hydroxide-simeth, labetalol , LORazepam , menthol , mouth rinse, phenol, promethazine  (PHENERGAN ) injection (IM or IVPB), sodium chloride  flush  Antimicrobials: Anti-infectives (From admission, onward)    Start     Dose/Rate Route Frequency Ordered Stop   11/28/24 1200  Ampicillin -Sulbactam (UNASYN ) 3 g in sodium chloride  0.9 % 100 mL IVPB  Status:  Discontinued        3 g 200 mL/hr over 30 Minutes Intravenous Every 6 hours 11/28/24 1048 12/05/24 0909   11/28/24 0045  Ampicillin -Sulbactam (UNASYN ) 3 g in sodium chloride  0.9 % 100 mL IVPB        3 g 200 mL/hr over 30 Minutes Intravenous  Once 11/28/24 0042 11/28/24 0134        I have personally reviewed the following labs and images: CBC: Recent Labs  Lab 12/07/24 0739  WBC 3.8*  NEUTROABS 2.6  HGB 12.0*  HCT 35.6*  MCV 89.7  PLT 292   BMP &GFR Recent Labs  Lab 12/04/24 1102 12/06/24 0748 12/07/24 0739 12/08/24 0412 12/09/24 0742  NA 134* 131* 135 134* 140  K 4.3 4.1 4.4 5.0 4.6  CL 95* 97* 102 103 107  CO2 26 24 23  21* 21*  GLUCOSE 95 150* 144* 190* 113*  BUN 22* 21* 23* 24* 38*  CREATININE 0.92 0.65 0.67 0.61 0.76  CALCIUM  8.7* 8.6* 8.6* 8.6* 8.9  MG 2.2 2.3 2.2 2.4 2.2  PHOS 2.7 2.7 3.1 2.3* 1.9*   Estimated Creatinine Clearance: 98.9 mL/min (by C-G formula based on SCr of 0.76 mg/dL). Liver & Pancreas: Recent Labs  Lab 12/04/24 1102 12/06/24 0748 12/08/24 0412 12/09/24 0742  AST 111* 48* 26 31  ALT 275* 189* 116* 99*  ALKPHOS 172* 169* 171* 191*  BILITOT 0.9 0.6 0.5 0.7  PROT 6.3* 6.3* 6.0* 6.5  ALBUMIN  3.5 3.2* 3.0* 3.2*   No results for input(s): LIPASE, AMYLASE in the last 168 hours.  No results for input(s): AMMONIA in the last 168 hours. Diabetic: No results for input(s): HGBA1C in the last 72 hours.  Recent Labs  Lab 12/08/24 1752 12/08/24 2051 12/08/24 2357 12/09/24 0716 12/09/24 1124   GLUCAP 138* 161* 159* 101* 89   Cardiac Enzymes: No results for input(s): CKTOTAL, CKMB, CKMBINDEX, TROPONINI in the last 168 hours. No results for input(s): PROBNP in the last 8760 hours. Coagulation Profile: No results for input(s): INR, PROTIME in the last 168 hours.  Thyroid  Function Tests: No results for input(s): TSH, T4TOTAL, FREET4, T3FREE, THYROIDAB in the last 72 hours. Lipid Profile: No results for input(s): CHOL, HDL, LDLCALC, TRIG, CHOLHDL, LDLDIRECT in the last 72 hours.  Anemia Panel: No results for input(s): VITAMINB12, FOLATE, FERRITIN, TIBC, IRON, RETICCTPCT in the last 72 hours. Urine analysis:    Component Value Date/Time   COLORURINE YELLOW 11/04/2024 1812   APPEARANCEUR CLEAR 11/04/2024 1812   LABSPEC 1.025 11/04/2024 1812   PHURINE 5.5 11/04/2024 1812   GLUCOSEU NEGATIVE 11/04/2024 1812   HGBUR NEGATIVE 11/04/2024 1812   BILIRUBINUR NEGATIVE 11/04/2024 1812   KETONESUR 15 (A) 11/04/2024 1812   PROTEINUR TRACE (A) 11/04/2024 1812   NITRITE NEGATIVE 11/04/2024 1812   LEUKOCYTESUR NEGATIVE 11/04/2024 1812   Sepsis Labs: Invalid input(s): PROCALCITONIN, LACTICIDVEN  Microbiology: No results found for this or any previous visit (from the past 240 hours).  Radiology Studies: No results found.     Chaise Mahabir T. Shalen Petrak [facility]  If 7PM-7AM, please contact night-coverage [email or web address] 12/09/2024, 1:01 PM   "

## 2024-12-09 NOTE — Progress Notes (Addendum)
 PHARMACY - TOTAL PARENTERAL NUTRITION CONSULT NOTE   Indication: intolerance to enteral feeds  Patient Measurements: Height: 6' (182.9 cm) Weight: 71.2 kg (156 lb 15.5 oz) IBW/kg (Calculated) : 77.6 TPN AdjBW (KG): 70 Body mass index is 21.29 kg/m. Usual Weight:   Assessment: 58 yoM admitted on 12/13 with intractable nausea and vomiting, dilated small bowel loops, and coffee-ground emesis. 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  He underwent EGD 12/16 showing esophagitis,gastric stensosis,  stent placed, impaired peristalsis.  Trial NG tube clamping and CLD. Pharmacy is consulted to dose TPN on 12/20 for intolerance to enteral feeding.    Glucose / Insulin : No hx DM, no PTA meds. - Dexamethasone  2mg  IV q12h- dc'd 12/24 am  - Dexamethasone  10 mg IV given 12/23 as premed for chemo - On sSSI q6h, has used 4 units/24 hours, CBGs 101 - 161 (goal <150) Electrolytes: Phos 1.9 (low), acetate 21 (low), all other lytes WNL including CoCa Renal: SCr < 1 (stable), BUN 38 (elevated) Hepatic: AST- down to WNL, ALT elevated but down trending -  Tbili WNL and alk phos slightly elevated -  Albumin  3.2 - Trig 164 12/23 Intake / Output; MIVF: LR @100ml /hr starting 12/25 AM >> MD okay with stopping MIVF before TPN starts this evening - I/O: +1,781 mL/24 hrs - Emesis: -362mL/24 hrs - Urine output: 1x occurrence  - LBM: 12/20 GI Imaging: - 12/18 Abd xray: No bowel dilatation or evidence of obstruction. Retained gastric fluid. 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 Prosthesis placed. Duodenal dilation deformity  GI Surgeries / Procedures:  - 12/16 EGD: esophagitis  GI meds:   Protonix  40mg  IV  q12h>> Octreotide  50 sq q8hrs>>DC'd 12/24; resumed 12/25 @50mcg /hr>> Phenergan  12.5 IV q6h ATC>>DC'd 12/24 Decadron  2 gm IV q12hrs>>DC'd 12/24  Central access: Implanted Port (01/22/24) TPN start date:  12/20   Nutritional Goals: Goal TPN rate is 90  mL/hr and provides 108 g of protein and 2182 kcals per day)  RD Assessment: Estimated Needs Total Energy Estimated Needs: 2100-2450 kcals Total Protein Estimated Needs: 105-120 grams Total Fluid Estimated Needs: >/= 2.1L  Current Nutrition:  Diet: NPO, Cyclic TPN  Plan:  Now - NaPhos 30 mmol IV x 1 - Pt ready for DC per TRH & Onc but insurance has not approved TPN yet. If approved, the earliest TPN can be set up is Friday but anticipate approval closer to Monday given the holiday.  At 1800: - Continue TPN to cycle over 12 hrs (glucose infusion rate 3.44-6.88 mg/kg/min) - Electrolytes in TPN:  Na 75 mEq/L - decreased given NaPhos x1, Octreotide  infusion re-ordered, and LR MIVF ordered K 40 mEq/L Ca 5 mEq/L Mg 3 mEq/L Phos 20 mmol/L - increased Cl:Ac to 1:2 - Add standard MVI and trace elements to TPN   Sensitive SSI four times a day at 0000, 0700, 1200,  2000-->  CBGs: 2 hours past cyclic TPN start + 1 hour past cyclic TPN d/c + middle of TPN  infusion + while off TPN (total 4 CBGs/24 hour period) - LR @100ml /hr per MD >> to end 1759 this evening, right before TPN starts  - Monitor LFTs closely - Monitor TPN labs on Mon/Thurs and PRN - Cmet, phos, mag on 12/26   Lacinda Moats, PharmD Clinical Pharmacist  12/25/20258:47 AM

## 2024-12-09 NOTE — Progress Notes (Signed)
" °   12/09/24 0425  Assess: MEWS Score  Temp 98.4 F (36.9 C)  BP 129/86  MAP (mmHg) 100  Pulse Rate (!) 124  Resp (!) 28  Level of Consciousness Alert  SpO2 95 %  O2 Device Nasal Cannula  O2 Flow Rate (L/min) 2 L/min  Assess: MEWS Score  MEWS Temp 0  MEWS Systolic 0  MEWS Pulse 2  MEWS RR 2  MEWS LOC 0  MEWS Score 4  MEWS Score Color Red  Assess: if the MEWS score is Yellow or Red  Were vital signs accurate and taken at a resting state? Yes  Does the patient meet 2 or more of the SIRS criteria? Yes  Does the patient have a confirmed or suspected source of infection? No  MEWS guidelines implemented  Yes, red  Treat  MEWS Interventions Considered administering scheduled or prn medications/treatments as ordered  Take Vital Signs  Increase Vital Sign Frequency  Red: Q1hr x2, continue Q4hrs until patient remains green for 12hrs  Escalate  MEWS: Escalate Red: Discuss with charge nurse and notify provider. Consider notifying RRT. If remains red for 2 hours consider need for higher level of care  Notify: Charge Nurse/RN  Name of Charge Nurse/RN Notified Paulina, RN  Provider Notification  Provider Name/Title Lynwood Kipper, NP  Date Provider Notified 12/09/24  Time Provider Notified [phone]  Method of Notification Page  Notification Reason Other (Comment) (Red MEWS)  Provider response See new orders  Date of Provider Response 12/09/24  Time of Provider Response 0444  Notify: Rapid Response  Name of Rapid Response RN Notified Mindy, RN  Date Rapid Response Notified 12/09/24  Time Rapid Response Notified 0506  Assess: SIRS CRITERIA  SIRS Temperature  0  SIRS Respirations  1  SIRS Pulse 1  SIRS WBC 0  SIRS Score Sum  2    "

## 2024-12-10 ENCOUNTER — Inpatient Hospital Stay (HOSPITAL_COMMUNITY)

## 2024-12-10 DIAGNOSIS — K920 Hematemesis: Secondary | ICD-10-CM | POA: Diagnosis not present

## 2024-12-10 DIAGNOSIS — E43 Unspecified severe protein-calorie malnutrition: Secondary | ICD-10-CM | POA: Diagnosis not present

## 2024-12-10 DIAGNOSIS — R710 Precipitous drop in hematocrit: Secondary | ICD-10-CM | POA: Diagnosis not present

## 2024-12-10 DIAGNOSIS — R509 Fever, unspecified: Secondary | ICD-10-CM | POA: Diagnosis not present

## 2024-12-10 DIAGNOSIS — Z515 Encounter for palliative care: Secondary | ICD-10-CM | POA: Diagnosis not present

## 2024-12-10 DIAGNOSIS — R110 Nausea: Secondary | ICD-10-CM | POA: Diagnosis not present

## 2024-12-10 DIAGNOSIS — C163 Malignant neoplasm of pyloric antrum: Secondary | ICD-10-CM | POA: Diagnosis not present

## 2024-12-10 DIAGNOSIS — R748 Abnormal levels of other serum enzymes: Secondary | ICD-10-CM

## 2024-12-10 DIAGNOSIS — Z86718 Personal history of other venous thrombosis and embolism: Secondary | ICD-10-CM | POA: Diagnosis not present

## 2024-12-10 DIAGNOSIS — C162 Malignant neoplasm of body of stomach: Secondary | ICD-10-CM

## 2024-12-10 DIAGNOSIS — K311 Adult hypertrophic pyloric stenosis: Secondary | ICD-10-CM | POA: Diagnosis not present

## 2024-12-10 LAB — MAGNESIUM: Magnesium: 2.1 mg/dL (ref 1.7–2.4)

## 2024-12-10 LAB — SAMPLE TO BLOOD BANK

## 2024-12-10 LAB — CBC WITH DIFFERENTIAL/PLATELET
Abs Immature Granulocytes: 0.08 K/uL — ABNORMAL HIGH (ref 0.00–0.07)
Basophils Absolute: 0 K/uL (ref 0.0–0.1)
Basophils Relative: 1 %
Eosinophils Absolute: 0.1 K/uL (ref 0.0–0.5)
Eosinophils Relative: 1 %
HCT: 31.3 % — ABNORMAL LOW (ref 39.0–52.0)
Hemoglobin: 10.4 g/dL — ABNORMAL LOW (ref 13.0–17.0)
Immature Granulocytes: 2 %
Lymphocytes Relative: 10 %
Lymphs Abs: 0.6 K/uL — ABNORMAL LOW (ref 0.7–4.0)
MCH: 30.3 pg (ref 26.0–34.0)
MCHC: 33.2 g/dL (ref 30.0–36.0)
MCV: 91.3 fL (ref 80.0–100.0)
Monocytes Absolute: 0.2 K/uL (ref 0.1–1.0)
Monocytes Relative: 5 %
Neutro Abs: 4.3 K/uL (ref 1.7–7.7)
Neutrophils Relative %: 81 %
Platelets: 292 K/uL (ref 150–400)
RBC: 3.43 MIL/uL — ABNORMAL LOW (ref 4.22–5.81)
RDW: 14.8 % (ref 11.5–15.5)
Smear Review: NORMAL
WBC: 5.3 K/uL (ref 4.0–10.5)
nRBC: 0.4 % — ABNORMAL HIGH (ref 0.0–0.2)

## 2024-12-10 LAB — COMPREHENSIVE METABOLIC PANEL WITH GFR
ALT: 83 U/L — ABNORMAL HIGH (ref 0–44)
AST: 24 U/L (ref 15–41)
Albumin: 2.8 g/dL — ABNORMAL LOW (ref 3.5–5.0)
Alkaline Phosphatase: 154 U/L — ABNORMAL HIGH (ref 38–126)
Anion gap: 11 (ref 5–15)
BUN: 37 mg/dL — ABNORMAL HIGH (ref 6–20)
CO2: 24 mmol/L (ref 22–32)
Calcium: 8.6 mg/dL — ABNORMAL LOW (ref 8.9–10.3)
Chloride: 104 mmol/L (ref 98–111)
Creatinine, Ser: 0.73 mg/dL (ref 0.61–1.24)
GFR, Estimated: >60 mL/min
Glucose, Bld: 250 mg/dL — ABNORMAL HIGH (ref 70–99)
Potassium: 4.4 mmol/L (ref 3.5–5.1)
Sodium: 138 mmol/L (ref 135–145)
Total Bilirubin: 0.4 mg/dL (ref 0.0–1.2)
Total Protein: 5.8 g/dL — ABNORMAL LOW (ref 6.5–8.1)

## 2024-12-10 LAB — GLUCOSE, CAPILLARY
Glucose-Capillary: 125 mg/dL — ABNORMAL HIGH (ref 70–99)
Glucose-Capillary: 173 mg/dL — ABNORMAL HIGH (ref 70–99)
Glucose-Capillary: 183 mg/dL — ABNORMAL HIGH (ref 70–99)
Glucose-Capillary: 212 mg/dL — ABNORMAL HIGH (ref 70–99)
Glucose-Capillary: 255 mg/dL — ABNORMAL HIGH (ref 70–99)
Glucose-Capillary: 82 mg/dL (ref 70–99)

## 2024-12-10 LAB — PHOSPHORUS: Phosphorus: 2.2 mg/dL — ABNORMAL LOW (ref 2.5–4.6)

## 2024-12-10 MED ORDER — TRAVASOL 10 % IV SOLN
INTRAVENOUS | Status: AC
  Filled 2024-12-10: qty 1080

## 2024-12-10 MED ORDER — SODIUM PHOSPHATES 45 MMOLE/15ML IV SOLN
15.0000 mmol | Freq: Once | INTRAVENOUS | Status: AC
  Administered 2024-12-10: 15 mmol via INTRAVENOUS
  Filled 2024-12-10: qty 5

## 2024-12-10 MED ORDER — SODIUM CHLORIDE 0.9 % IV SOLN
50.0000 ug/h | INTRAVENOUS | Status: DC
  Administered 2024-12-10 – 2024-12-23 (×26): 50 ug/h via INTRAVENOUS
  Filled 2024-12-10 (×20): qty 1

## 2024-12-10 MED ORDER — LACTATED RINGERS IV SOLN: INTRAVENOUS | Status: AC

## 2024-12-10 MED ORDER — INSULIN ASPART 100 UNIT/ML IJ SOLN
0.0000 [IU] | INTRAMUSCULAR | Status: DC
  Administered 2024-12-10: 3 [IU] via SUBCUTANEOUS
  Administered 2024-12-10: 2 [IU] via SUBCUTANEOUS
  Administered 2024-12-11: 5 [IU] via SUBCUTANEOUS
  Administered 2024-12-11 (×2): 3 [IU] via SUBCUTANEOUS
  Administered 2024-12-11: 5 [IU] via SUBCUTANEOUS
  Administered 2024-12-11: 2 [IU] via SUBCUTANEOUS
  Administered 2024-12-12: 3 [IU] via SUBCUTANEOUS
  Administered 2024-12-12: 2 [IU] via SUBCUTANEOUS
  Administered 2024-12-12: 5 [IU] via SUBCUTANEOUS
  Administered 2024-12-13 (×2): 3 [IU] via SUBCUTANEOUS
  Administered 2024-12-13 (×3): 2 [IU] via SUBCUTANEOUS
  Administered 2024-12-14: 3 [IU] via SUBCUTANEOUS
  Filled 2024-12-10: qty 3
  Filled 2024-12-10: qty 5
  Filled 2024-12-10 (×2): qty 2
  Filled 2024-12-10: qty 5
  Filled 2024-12-10: qty 2
  Filled 2024-12-10: qty 3
  Filled 2024-12-10 (×2): qty 5
  Filled 2024-12-10 (×2): qty 3

## 2024-12-10 MED ORDER — OCTREOTIDE ACETATE 50 MCG/ML IJ SOLN
50.0000 ug | Freq: Three times a day (TID) | INTRAMUSCULAR | Status: DC
  Filled 2024-12-10: qty 1

## 2024-12-10 MED ORDER — LORAZEPAM 2 MG/ML IJ SOLN
0.5000 mg | Freq: Four times a day (QID) | INTRAMUSCULAR | Status: DC | PRN
  Administered 2024-12-12 – 2024-12-30 (×16): 0.5 mg via INTRAVENOUS
  Filled 2024-12-10 (×12): qty 1

## 2024-12-10 NOTE — TOC Progression Note (Signed)
 Transition of Care [facility]) - Progression Note    Patient Details  Name: Henry May MRN: [id number] Date of Birth: 04-28-1966  Transition of [facility]) CM/SW Contact  Lorraine LILLETTE [place], [place] Phone Number: 12/10/2024, 3:07 PM  Clinical Narrative:     Holley Herring called, waiting for insurance auth. ICM following.     Barriers to Discharge: Continued Medical Work up               Expected Discharge Plan and Services                                               Social Drivers of Health (SDOH) Interventions SDOH Screenings   Food Insecurity: Patient Declined (11/28/2024)  Housing: Unknown (11/28/2024)  Transportation Needs: Patient Declined (11/28/2024)  Utilities: Patient Declined (11/28/2024)  Depression (PHQ2-9): Medium Risk (11/26/2024)  Social Connections: Unknown (04/30/2022)   Received from [facility]  Tobacco Use: Medium Risk (11/30/2024)    Readmission Risk Interventions    12/06/2024   10:13 AM 02/07/2024    8:40 AM  Readmission Risk Prevention Plan  Transportation Screening Complete Complete  HRI or Home Care Consult Complete Complete  Social Work Consult for Recovery Care Planning/Counseling Complete Complete  Palliative Care Screening Not Applicable Not Applicable  Medication Review Oceanographer) Complete Complete

## 2024-12-10 NOTE — Plan of Care (Signed)

## 2024-12-10 NOTE — Progress Notes (Addendum)
 "  Daily Progress Note   Patient Name: Henry May       Date: 12/10/2024 DOB: [birth year]  Age: 58 y.o. MRN#: [id number] Attending Physician: Kathrin Mignon DASEN, MD Primary Care Physician: [name], DO Admit Date: 11/27/2024  Reason for Consultation/Follow-up: Establishing goals of care  Subjective: Chart Reviewed. Updates Received. Patient Assessed. Patient resting in bed. Appears uncomfortable. States stomach is sore. Wife is present. Shares patient recently had a vomiting episode 30 min prior. RN to bedside and made aware. Will further assess. Patient received Cisplatin  on 12/23.   Henry May to be weak and fatigued. Reports coughing up small amounts of blood at times. He is saving sputum in the cup as wife reports he has been advised to allow for evaluation by medical team.   I attempted to approach ongoing goals of care discussions. Patient and wife shares discussions with Oncologist (Dr. Cloretta) this morning. Daughter and patient was present during his visit. Octreotide  was restarted on yesterday. Goals of care discussions have been ongoing.   Patient and wife are hesitant to proceed with further discussions as expressed goals remains clear to continue to treat the treatable aggressively. Respectfully acknowledge their wishes at this time. Encouraged ongoing goals of care discussions out of concern of overall condition, incurable cancer, and high risk of sudden decline with no meaningful recovery despite all efforts.   We will continue to support and follow in collaboration with the Oncologist as needed.   ADDENDUM 1545: received a call from daughter who would like to schedule a time for serious discussions.. She shares her mother is withdrawn and does not want to address her father's overall condition. Daughter reports she is emotional thinking and fearing the unknown however she knows it has to be discussed sooner rather than later. She does not want her mother to  be burden down with having to make a decision on her own even with her support and is hopeful that although this will be a difficult discussion to have that her father will allow them to understand his wishes in the event they are required to make decisions on his behalf. She is also concerned that they may not fully understand the severity of his condition and request our meeting includes an interpretor to explain to them in Spanish. Emotional support provided. They would like to plan for meeting Monday however request follow-up tomorrow in case of changes. Acknowledge daughter's request.   Length of Stay: 12 days  Vital Signs: BP 106/66   Pulse 98   Temp 97.9 F (36.6 C)   Resp 16   Ht 6' (1.829 m)   Wt 73.6 kg   SpO2 97%   BMI 22.01 kg/m  SpO2: SpO2: 97 % O2 Device: O2 Device: Nasal Cannula O2 Flow Rate: O2 Flow Rate (L/min): 3 L/min Last Weight  Most recent update: 12/10/2024  6:42 AM    Weight  73.6 kg (162 lb 4.1 oz)             Intake/Output Summary (Last 24 hours) at 12/10/2024 1302 Last data filed at 12/10/2024 9668 Gross per 24 hour  Intake 3411.64 ml  Output --  Net 3411.64 ml    Physical Exam: Gen:  Thin, Weak appearing  HEENT: moist mucous membranes CV: Tachycardic PULM: Diminished bilaterally ABD: soft/tender/non distended EXT: No edema Neuro: Alert and oriented x3  Palliative Care Assessment & Plan  HPI:58 y.o. male  with past medical history of gastric cancer (new chemotherapy 12/12), DVT with  IVC filter admitted on 11/27/2024 with nausea and vomiting with coffee ground emesis.    EGD 12/4 showed grade D esophagitis without bleeding and malignant infiltrative gastric tumor in the gastric body and in the gastric antrum with antral narrowing.   CT abdomen/pelvis with contrast 12/13 showed dilated proximal small bowel suggesting SBO but without discrete transition point, thick walled gastric body/antrum without definite focal mass, mild nodularity along  the pylorus.    12/16 Patient had upper GI endoscopy and stenting of gastric narrowing.  12/23: Received Cisplatin  chemo.  Code Status: Full code  Recommendations/Plan: Full Code/Full Scope-Oncology leading discussions Continue with current plan of care per medical team  Will plan GOC discussion with use of interpretor as requested by daughter for Monday. Follow-Up on Sunday in case they wish to start discussions sooner. See discussion above.  PMT will continue to support and follow on as needed basis. Please secure chat for urgent unmet palliative needs.  Symptom Management: Nausea  Octreotide  Continuous Protonix  40 mg BID Promethazine  25mg  IV every 6 hours as needed   Thank you for allowing the Palliative Medicine Team to assist in the care of this patient.  Palliative Medicine Team providers are available by phone from 7am to 7pm daily and can be reached through the team cell phone. Should this patient require assistance outside of these hours, please call the patient's attending physician.   I personally spent a total of 45 minutes in the care of the patient today including preparing to see the patient, getting/reviewing separately obtained history, performing a medically appropriate exam/evaluation, counseling and educating, documenting clinical information in the EHR, independently interpreting results, and coordinating care. Visit consisted of counseling and education dealing with the complex and emotionally intense issues of symptom management and palliative care in the setting of serious and potentially life-threatening illness.  Levon Borer, AGPCNP-BC  Palliative Medicine TeamWL [facility] Center  [phone]  *Please note that this is a verbal dictation therefore any spelling or grammatical errors are due to the Dragon Medical One system interpretation.   "

## 2024-12-10 NOTE — Progress Notes (Addendum)
 PHARMACY - TOTAL PARENTERAL NUTRITION CONSULT NOTE   Indication: intolerance to enteral feeds  Patient Measurements: Height: 6' (182.9 cm) Weight: 73.6 kg (162 lb 4.1 oz) IBW/kg (Calculated) : 77.6 TPN AdjBW (KG): 70 Body mass index is 22.01 kg/m. Usual Weight:   Assessment: 58 yoM admitted on 12/13 with intractable nausea and vomiting, dilated small bowel loops, and coffee-ground emesis. 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  He underwent EGD 12/16 showing esophagitis,gastric stensosis,  stent placed, impaired peristalsis.  Trial NG tube clamping and CLD. Pharmacy is consulted to dose TPN on 12/20 for intolerance to enteral feeding.    Glucose / Insulin : No hx DM, no PTA meds. - Dexamethasone  2mg  IV q12h- dc'd 12/24 am  - Dexamethasone  10 mg IV given 12/23 as premed for chemo - Octreotide  infusion resumed yesterday @50  mcg/hr (noted that Octreotide , especially at higher doses/frequencies, can cause glucose dysregulation). Discussed with MD about transitioning back to Octreotide  50mcg q8h, however, patient now with hematemesis so continuing with Octreotide  infusion for now.  - On sSSI, has used 10 units/24 hours, CBGs 89 - 255 (goal <150) Electrolytes: Phos 2.2 (low but improved), all other lytes WNL including CoCa 9.3 Renal: SCr < 1 (stable), BUN 37 (elevated) Hepatic: AST- down to WNL, ALT elevated but down trending - Tbili WNL and alk phos slightly elevated - Albumin  2.8 - Trig 164 12/23 Intake / Output; MIVF: LR @100ml /hr thru 12/26 @1759  - I/O: +3,411 mL/24 hrs - Emesis: 2x occurrence in last 24hrs - Urine output: 3x occurrence in last 24hrs - LBM: 12/25 GI Imaging: - 12/18 Abd xray: No bowel dilatation or evidence of obstruction. Retained gastric fluid. 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 Prosthesis placed. Duodenal dilation deformity  GI Surgeries / Procedures:  - 12/16 EGD: esophagitis  GI meds:   - Protonix  40mg  IV   q12h >> - Octreotide  50 sq q8hrs >> DC'd 12/24; resumed 12/25 @50mcg /hr  - Decadron  2 gm IV q12hrs >> DC'd 12/24  Central access: Implanted Port (01/22/24) TPN start date: 12/20   Nutritional Goals: Goal TPN rate is 90 mL/hr and provides 108 g of protein and 2182 kcals per day)  RD Assessment: Estimated Needs Total Energy Estimated Needs: 2100-2450 kcals Total Protein Estimated Needs: 105-120 grams Total Fluid Estimated Needs: >/= 2.1L  Current Nutrition:  Diet: NPO, Cyclic TPN  Plan:  Now - NaPhos 15 mmol IV x 1 - TPN pending insurance approval for outpatient use  At 1800: - Continue TPN to cycle over 12 hrs (glucose infusion rate 3.33-6.66 mg/kg/min) - Electrolytes in TPN:  Na 100 mEq/L - slightly increased (continuous Octreotide  infusion, LR, and IV NaPhos x1 all ordered, so will monitor Na closely) K 40 mEq/L Ca 5 mEq/L Mg 3 mEq/L Phos 25 mmol/L - increased Cl:Ac to 1:2 - Add standard MVI and trace elements to TPN - Will adjust to moderate intensity SSI q4h given lack of CBG control 12/25 PM. If patient remains on Octreotide  infusion 12/27 and CBGs remain elevated this evening on TPN, will consider adding insulin  to TPN 12/27.  - Stop LR this evening at 1759 prior to starting TPN. Can resume LR @100ml /hr once TPN stops 12/27 at 0600. - Monitor LFTs closely - Monitor TPN labs on Mon/Thurs and PRN - Cmet, phos, mag on 12/27   Lacinda Moats, PharmD Clinical Pharmacist  12/26/20257:03 AM

## 2024-12-10 NOTE — Progress Notes (Signed)
 " PROGRESS NOTE  Henry May FMW:[id number] DOB: 12-10-1966   PCP: [name], DO  Patient is from: Home.  Lives with wife and daughter.  DOA: 11/27/2024 LOS: 12  Chief complaints No chief complaint on file.    Brief Narrative / Interim history: 58 year old M with PMH of DVT with IVC filter, gastric cancer on chemo and esophagitis presented to [facility] due to nausea, vomiting and coffee-ground emesis 2 days after he started new chemotherapy, and admitted for intractable nausea and vomiting, dilated small bowel loops and coffee-ground emesis.  Recent EGD on 12/4 with LA grade D esophagitis without bleeding, and large infiltrative, sessile and ulcerated circumferential mass with contact of bleeding in gastric body and antrum with antral narrowing and luminal diameter of approximately 1.5 cm  In ED, stable vitals.  CBC and CMP without significant finding.  Lipase 171.  CT abdomen and pelvis showed dilated proximal small bowel suggesting SBO but without discrete transition point, thick walled gastric body/antrum without definite focal mass, mild nodularity along the pylorus, scarring with superimposed patchy peribronchovascular nodularity in the bilateral lower lobes right more than left suggesting mild aspiration or pneumonia.  Patient was started on antiemetics, IV fluid, PPI and admitted for further care.  Oncology, surgery, GI and palliative medicine consulted.  Patient underwent EGD and gastric stent placement on 12/16.  Was unable to tolerate diet.  Started on TPN.  Plan was for discharge with TPN once logistic in place but nausea and vomiting gotten worse after stopping octreotide  and Decadron .  Also having hemoptysis.  Restarted on octreotide  infusion.    Subjective: Seen and examined earlier this morning.  Daughter at bedside.  She noted that he is coughing up dark blood.  Had 1 episode of emesis last night.  He had dark hematemesis this morning during my visit.  Assessment  and plan: Gastric cancer, gastric luminal narrowing and esophagitis: Intractable nausea and vomiting: Likely due to the above. Hematemesis-likely due to the above.  CT with dilated small bowel without transition point.  -S/p EGD and gastric stenting in 12/16. -Remains n.p.o. except ice chips and sips with meds. -Continue TPN per pharmacy.  -Continue IV Phenergan , Zofran  and Ativan . -Resumed octreotide  infusion on 12/25.  Will continue. -LR at 75 cc an hour during the day.  TPN at night. -Continue IV Protonix  40 mg twice daily -Reengaging GI given  -Continue monitoring CBC. -Palliative following.  Remains full code with full scope of care.  Gastric cancer: Recently restarted on chemo.  Followed by Dr. Zorita.  -Oncology and palliative medicine on board. -Supportive care as above.  Possible aspiration pneumonitis/pneumonia: Initial CXR showed patchy airspace opacities in RML.  CT abdomen and pelvis with bibasilar infiltrate. Completed 7 days of appropriate antibiotics.  Repeat CXR on 12/25 with persistent RML and RLL opacities likely from ongoing aspiration pneumonitis.  Monitor off antibiotics   Diet controlled diabetes: A1c 5.7%.  Not on medication at home. -Continue SSI sensitive.   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  Improved.  Insomnia mainly due to intractable nausea and vomiting - IV Ativan  and Phenergan  as above.  Lightheadedness/presyncope: Likely due to intractable nausea, vomiting and hematemesis. - IV fluid as above  Severe malnutrition Body mass index is 22.01 kg/m. Nutrition Problem: Severe Malnutrition Etiology: chronic illness (gastric cancer) Signs/Symptoms: severe fat depletion, severe muscle depletion, percent weight loss (24% weight loss in less than 3 months) Percent weight loss: 24 % (  in < 3 months) Interventions: Refer to RD note for recommendations   DVT prophylaxis:  SCDs Start:  11/28/24 1042  Code Status: Full code Family Communication: Updated patient's daughter at bedside. Level of care: Progressive Status is: Inpatient Remains inpatient appropriate because: Intractable nausea and vomiting, hematemesis and gastric cancer   Final disposition: Likely home    55 minutes with more than 50% spent in reviewing records, counseling patient/family and coordinating care.  Consultants:  Gastroenterology General Surgery Oncology Palliative medicine  Procedures: 12/16-EGD and gastric stenting  Microbiology summarized: None  Objective: Vitals:   12/09/24 1501 12/09/24 1814 12/10/24 0628 12/10/24 1035  BP: 105/60 114/74 100/64 106/66  Pulse: 79 78 (!) 122 98  Resp: 17 14 (!) 24 16  Temp: 98.3 F (36.8 C) 98.1 F (36.7 C) 98 F (36.7 C) 97.9 F (36.6 C)  TempSrc: Oral Oral Oral   SpO2: 99% 98% 93% 97%  Weight:   73.6 kg   Height:        Examination:  GENERAL: No apparent distress.  Appears frail. HEENT: MMM.  Vision and hearing grossly intact.  NECK: Supple.  No apparent JVD.  RESP:  No IWOB.  Fair aeration bilaterally. CVS:  RRR. Heart sounds normal.  ABD/GI/GU: BS+. Abd soft, NTND.  MSK/EXT:  Moves extremities. No apparent deformity. No edema.  SKIN: no apparent skin lesion or wound NEURO: AA.  Oriented appropriately.  No apparent focal neuro deficit. PSYCH: Calm. Normal affect.   Sch Meds:  Scheduled Meds:  Chlorhexidine  Gluconate Cloth  6 each Topical Daily   insulin  aspart  0-15 Units Subcutaneous Q4H   mouth rinse  15 mL Mouth Rinse 4 times per day   pantoprazole  (PROTONIX ) IV  40 mg Intravenous Q12H   Continuous Infusions:  lactated ringers  100 mL/hr at 12/10/24 1007   [START ON 12/11/2024] lactated ringers      octreotide  (SANDOSTATIN ) 500 mcg in sodium chloride  0.9 % 250 mL (2 mcg/mL) infusion 50 mcg/hr (12/10/24 1011)   promethazine  (PHENERGAN ) injection (IM or IVPB) 25 mg (12/10/24 0507)   sodium phosphate  15 mmol in sodium  chloride 0.9 % 250 mL infusion 15 mmol (12/10/24 1007)   TPN CYCLIC-ADULT (ION)     PRN Meds:.acetaminophen  **OR** acetaminophen , alum & mag hydroxide-simeth, labetalol , LORazepam , menthol , mouth rinse, phenol, promethazine  (PHENERGAN ) injection (IM or IVPB), sodium chloride  flush  Antimicrobials: Anti-infectives (From admission, onward)    Start     Dose/Rate Route Frequency Ordered Stop   11/28/24 1200  Ampicillin -Sulbactam (UNASYN ) 3 g in sodium chloride  0.9 % 100 mL IVPB  Status:  Discontinued        3 g 200 mL/hr over 30 Minutes Intravenous Every 6 hours 11/28/24 1048 12/05/24 0909   11/28/24 0045  Ampicillin -Sulbactam (UNASYN ) 3 g in sodium chloride  0.9 % 100 mL IVPB        3 g 200 mL/hr over 30 Minutes Intravenous  Once 11/28/24 0042 11/28/24 0134        I have personally reviewed the following labs and images: CBC: Recent Labs  Lab 12/07/24 0739 12/10/24 0908  WBC 3.8* 5.3  NEUTROABS 2.6 4.3  HGB 12.0* 10.4*  HCT 35.6* 31.3*  MCV 89.7 91.3  PLT 292 292   BMP &GFR Recent Labs  Lab 12/06/24 0748 12/07/24 0739 12/08/24 0412 12/09/24 0742 12/10/24 0342  NA 131* 135 134* 140 138  K 4.1 4.4 5.0 4.6 4.4  CL 97* 102 103 107 104  CO2 24 23 21* 21* 24  GLUCOSE 150* 144* 190* 113* 250*  BUN 21* 23* 24* 38* 37*  CREATININE 0.65 0.67 0.61 0.76 0.73  CALCIUM  8.6* 8.6* 8.6* 8.9 8.6*  MG 2.3 2.2 2.4 2.2 2.1  PHOS 2.7 3.1 2.3* 1.9* 2.2*   Estimated Creatinine Clearance: 102.2 mL/min (by C-G formula based on SCr of 0.73 mg/dL). Liver & Pancreas: Recent Labs  Lab 12/04/24 1102 12/06/24 0748 12/08/24 0412 12/09/24 0742 12/10/24 0342  AST 111* 48* 26 31 24   ALT 275* 189* 116* 99* 83*  ALKPHOS 172* 169* 171* 191* 154*  BILITOT 0.9 0.6 0.5 0.7 0.4  PROT 6.3* 6.3* 6.0* 6.5 5.8*  ALBUMIN  3.5 3.2* 3.0* 3.2* 2.8*   No results for input(s): LIPASE, AMYLASE in the last 168 hours.  No results for input(s): AMMONIA in the last 168 hours. Diabetic: No results for  input(s): HGBA1C in the last 72 hours.  Recent Labs  Lab 12/09/24 1124 12/09/24 2018 12/10/24 0004 12/10/24 0624 12/10/24 1221  GLUCAP 89 215* 255* 173* 125*   Cardiac Enzymes: No results for input(s): CKTOTAL, CKMB, CKMBINDEX, TROPONINI in the last 168 hours. No results for input(s): PROBNP in the last 8760 hours. Coagulation Profile: No results for input(s): INR, PROTIME in the last 168 hours.  Thyroid  Function Tests: No results for input(s): TSH, T4TOTAL, FREET4, T3FREE, THYROIDAB in the last 72 hours. Lipid Profile: No results for input(s): CHOL, HDL, LDLCALC, TRIG, CHOLHDL, LDLDIRECT in the last 72 hours.  Anemia Panel: No results for input(s): VITAMINB12, FOLATE, FERRITIN, TIBC, IRON, RETICCTPCT in the last 72 hours. Urine analysis:    Component Value Date/Time   COLORURINE YELLOW 11/04/2024 1812   APPEARANCEUR CLEAR 11/04/2024 1812   LABSPEC 1.025 11/04/2024 1812   PHURINE 5.5 11/04/2024 1812   GLUCOSEU NEGATIVE 11/04/2024 1812   HGBUR NEGATIVE 11/04/2024 1812   BILIRUBINUR NEGATIVE 11/04/2024 1812   KETONESUR 15 (A) 11/04/2024 1812   PROTEINUR TRACE (A) 11/04/2024 1812   NITRITE NEGATIVE 11/04/2024 1812   LEUKOCYTESUR NEGATIVE 11/04/2024 1812   Sepsis Labs: Invalid input(s): PROCALCITONIN, LACTICIDVEN  Microbiology: No results found for this or any previous visit (from the past 240 hours).  Radiology Studies: No results found.     Lakota Markgraf T. Caili Escalera [facility]  If 7PM-7AM, please contact night-coverage [email or web address] 12/10/2024, 12:43 PM   "

## 2024-12-10 NOTE — Progress Notes (Signed)
 IP PROGRESS NOTE  Subjective:   Henry May developed frequent nausea and vomiting during the night on 12/08/2024.  He had hematemesis last night.  He had presyncope when getting up to the restroom last night.  No bowel movement or flatus.  His daughter was at the bedside when I saw him at approximately 8:30 AM.  She reports some relief from nausea when he is on octreotide .  Octreotide  was resumed yesterday.     Objective: Vital signs in last 24 hours: Blood pressure 100/64, pulse (!) 122, temperature 98 F (36.7 C), temperature source Oral, resp. rate (!) 24, height 6' (1.829 m), weight 162 lb 4.1 oz (73.6 kg), SpO2 93%.  Intake/Output from previous day: 12/25 0701 - 12/26 0700 In: 3411.6 [I.V.:3022.5; IV Piggyback:389.1] Out: -   Physical Exam:  HEENT: No thrush, dried blood over the lips Lungs: Clear bilaterally Cardiac: Regular rate and rhythm, tachycardia  Abdomen: Soft, nontender, no hepatomegaly Extremities: No leg edema   Portacath/PICC-without erythema  Lab Results: No results for input(s): WBC, HGB, HCT, PLT in the last 72 hours.    BMET Recent Labs    12/09/24 0742 12/10/24 0342  NA 140 138  K 4.6 4.4  CL 107 104  CO2 21* 24  GLUCOSE 113* 250*  BUN 38* 37*  CREATININE 0.76 0.73  CALCIUM  8.9 8.6*    Lab Results  Component Value Date   CEA 3.57 01/15/2024      Medications: I have reviewed the patient's current medications.  Assessment/Plan: Gastric cancer 11/21/2023 EGD-gastric body with infiltrative looking lesion; biopsy shows involvement of a hypercellular lesion that suggests diffuse carcinoma by morphology CTs abdomen/pelvis 11/28/2023-diffuse fatty infiltration of the liver; gastric wall thickening; lymph nodes up to 6 mm in diameter near the stomach wall. 12/31/2023 upper endoscopy-large diffuse friable infiltrative polypoid and ulcerated circumferential mass found in the gastric body.  Scope was passed beyond the mass with normal  antrum/pylorus.  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  Negative for HER2 (1+); mismatch repair protein IHC normal; PD-L1 CPS 0%; CLDN18 positive: 90% of tumor cells with 2+/3+ membrane staining PET scan 01/07/2024-circumferential hypermetabolic gastric mucosal thickening.  Ill-defined hypermetabolic lymph nodes in the gastrohepatic ligament.  Intense hypermetabolic activity associated with the right lobe of the thyroid  gland.  Small hypermetabolic right axillary node favored reactive. 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  Gastric wall thickening slightly improved. Cycle 7 oxaliplatin , zolbetuximab 05/05/2024, Fulphila  Cycle 8 zolbetuximab 05/20/2024, oxaliplatin  held due to neuropathy, no Fulphila  Cycle 9 oxaliplatin /zolbetuximab 06/03/2024, Fulphila  Cycle 10 oxaliplatin /zolbetuximab 06/17/2024, Fulphila  Cycle 11 oxaliplatin /zolbetuximab 06/30/2024, Fulphila , oxaliplatin  dose reduced Cycle 12 zolbetuximab 07/15/2024, oxaliplatin  held secondary to neuropathy Cycle 13 zolbetuximab 07/29/2024, oxaliplatin  held secondary to neuropathy Cycle 14 zolbetuximab 08/26/2024, oxaliplatin  held secondary to neuropathy 09/01/2024 CTs-mild distal gastric wall thickening without discrete mass.  No metastatic disease. Cycle 15 zolbetuximab 09/09/2024,  oxaliplatin  held due to neuropathy Cycle 16 zolbetuximab 09/30/2024, oxaliplatin  held due to neuropathy Cycle 17 zolbetuximab 10/21/2024, oxaliplatin  held due to neuropathy 11/03/2024 CT abdomen/pelvis: Diffuse mild wall thickening  of the distal gastric body and antrum-stable, mild increase in a right gastric lymph node small volume ascites without omental nodularity, proximal jejunal mural thickening 11/04/2024 upper GI: Narrowing of the gastric antrum, normal gastric emptying, dilated duodenum and proximal jejunum without evidence of a bowel obstruction 11/18/2024 upper endoscopy: Tumor in the gastric body and antrum with antral narrowing, dilated duodenum without evidence of mass or obstruction, esophagitis: Biopsy of the gastric mass-poorly differentiated adenocarcinoma with focal signet ring morphology, esophagus biopsy: Squamous mucosa with focal ulceration Cycle 1 weekly cisplatin  11/26/2024 Cycle 2 weekly cisplatin  12/07/2024 Early satiety, postprandial abdominal pain, weight loss secondary to #1 History of bilateral lower extremity DVT 2021-anticoagulation discontinued due to massive retroperitoneal bleed, IVC filter placed 08/24/2020 Hospitalized with acute respiratory failure due to COVID-19 08/06/2020 - 09/04/2020 History of massive retroperitoneal bleed secondary to anticoagulation September 2021 Thyroid  ultrasound 01/13/2024-no abnormal nodule identified in the right lobe.  1.1 cm thyroid  isthmus nodule meets criteria for 1 year follow-up ultrasound. Admission 02/01/2024 with increased abdominal pain/ascites Hospitalization with altered mental status 02/05/2024 through 02/13/2024; question rare case of 5-FU psychosis.  Improved 02/16/2024.  Mental status at baseline 02/23/2024. Neutropenia 02/16/2024 Mucositis 02/16/2024.  Resolved 02/23/2024 Right knee pain/edema/erythema 03/01/2024-Doppler study negative for DVT History of gout Oxaliplatin  neuropathy-prolonged cold sensitivity, diminished vibratory  sense following cycle 5 chemotherapy.  Persistent cold sensitivity 05/20/2024, oxaliplatin  held.  Cold sensitivity resolved 06/03/2024, oxaliplatin  resumed.  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  There is evidence of disease progression with persistent/progressive tumor in the stomach and intractable nausea/vomiting.  The omental disease on presentation last year remains significantly improved, by CT criteria.  He responded well to initial treatment with oxaliplatin  based chemotherapy.  He has neuropathy symptoms.  He began a trial of salvage therapy with cisplatin  on 11/26/2024.  He completed cycle 2 on 12/07/2024.  He is now admitted with intractable nausea/vomiting, potentially related to a small bowel obstruction, ileus from the carcinomatosis, in addition to persistent tumor in the stomach.   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  There was retained fluid in the stomach and duodenum.  A gastric antral stent was placed.  He continues to have nausea and vomiting of liquids despite placement of the gastric antral stent.  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  He developed recurrent severe nausea and vomiting 12/08/2024.  It is possible the cisplatin  given 12/07/2024 contributing. He now has hematemesis, likely secondary to the known gastric mass.  Henry May appears weak this morning.  I discussed goals of care with him.  He indicated he would like to continue treatment and remain on a full CODE  STATUS.  These discussions should continue if his clinical status does not improve over the next few days.   Recommendations: Continue TPN, IV fluids when off of TPN Continue octreotide , Phenergan , and Ativan  Check CBC Continue goals of care discussion Please call oncology as needed, I will check on him 12/13/2024   LOS: 12 days   Arley Hof, MD   12/10/2024, 8:26 AM

## 2024-12-10 NOTE — Progress Notes (Signed)
" °   12/10/24 9371  Assess: MEWS Score  Temp 98 F (36.7 C)  BP 100/64  MAP (mmHg) 75  Pulse Rate (!) 122  Resp (!) 24  SpO2 93 %  O2 Device Room Air  Assess: MEWS Score  MEWS Temp 0  MEWS Systolic 1  MEWS Pulse 2  MEWS RR 1  MEWS LOC 0  MEWS Score 4  MEWS Score Color Red  Assess: if the MEWS score is Yellow or Red  Were vital signs accurate and taken at a resting state? Yes  Does the patient meet 2 or more of the SIRS criteria? Yes  Does the patient have a confirmed or suspected source of infection? No  MEWS guidelines implemented  No, previously red, continue vital signs every 4 hours  Notify: Charge Nurse/RN  Name of Charge Nurse/RN Notified Lauren, RN  Provider Notification  Provider Name/Title Lynwood Kipper, NP  Date Provider Notified 12/10/24  Time Provider Notified [phone]  Method of Notification Page  Notification Reason Other (Comment) (Red MEWs)  Date of Provider Response 12/10/24  Time of Provider Response [phone]  Notify: Rapid Response  Name of Rapid Response RN Notified Timber, RN  Date Rapid Response Notified 12/10/24  Time Rapid Response Notified 0650  Assess: SIRS CRITERIA  SIRS Temperature  0  SIRS Respirations  1  SIRS Pulse 1  SIRS WBC 0  SIRS Score Sum  2    "

## 2024-12-10 NOTE — Progress Notes (Addendum)
 "    [facility] Re-Condult Note  Referring Provider: Dr. Mignon Bump Primary Care Physician:  Pcp, No Primary Gastroenterologist: Dr. 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 Admitted 11/27/2024 with intractable nausea/vomiting/coffee-ground emesis with stable hemoglobin (Hb 14.5 - baseline) and unable to keep anything down. Repeat CTAP 12/13 showed thick walled gastric body and antrum without a definitive mass with nodularity along the pylorus and proximal dilated small bowel loops, ? mild obstruction without a discrete transition point.  Refer to initial GI consult per Dr. Charlanne 11/28/2024. Worsening nausea and vomiting thought likely due to cisplatin  based chemotherapy with antral stenosis, gastric stasis without mechanical outlet obstruction on recent upper GI series and EGD.  Dilated small bowel loops possibly functional obstruction due to known peritoneal carcinomatosis versus malignant ileus.  Supportive treatment with IV fluids, IV Protonix , IV Reglan  and Phenergan  were initiated.  He was evaluated by general surgery, no indication for emergent surgical intervention. He underwent an EGD by Dr. 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  NG tube for decompression was also placed.  IV Protonix  was continued. Octreotide  was initiated by palliative care and subsequently discontinued. Patient was last seen by our GI service 12/03/2024.  A GI reconsult was requested today by Dr. Gonfa for further management of hematemesis which started 11/2024, Hg 12 dropped down to 10.4. Octreotide  was restarted.  His spouse, daughter and multiple family members and friend at the bedside.  He described vomiting dark red blood x 1  yesterday and since then has coughed up watery dark red blood numerous times.  He describes vomiting up blood like a volcano without associated nausea but also intermittently has had nausea throughout this hospitalization.  No BM since he received an enema last week as reported per his family.  No oral food intake, on TPN.  He endorses having ongoing acid reflux.  No abdominal pain.  Chest x-ray 12/25 showed persistent right middle and lower lobe opacities and subcutaneous emphysema over the lower neck/shoulder.  Abdominal x-ray 12/02/2024 was negative for bowel dilatation or obstruction.   Objective:  Vital signs in last 24 hours: Temp:  [97.9 F (36.6 C)-98.3 F (36.8 C)] 97.9 F (36.6 C) (12/26 1035) Pulse Rate:  [78-122] 98 (12/26 1035) Resp:  [14-24] 16 (12/26 1035) BP: (100-114)/(60-74) 106/66 (12/26 1035) SpO2:  [93 %-99 %] 97 % (12/26 1035) Weight:  [73.6 kg] 73.6 kg (12/26 0628) Last BM Date : 12/04/24 General: Alert chronically ill-appearing 58 year old male in no acute distress. Eyes: No scleral icterus. Heart: Regular rate and rhythm, no murmurs. Pulm: Few crackles to the right lower base otherwise clear. Abdomen: Soft, nondistended.  Nontender.  Passive bowel sounds to all 4 quadrants. Extremities: No lower extremity edema. Neurologic:  Alert and oriented x 4.  Speech is clear.  Moves all extremities equally. Psych:  Alert and cooperative. Normal mood and affect.  Intake/Output from previous day: 12/25 0701 - 12/26 0700 In: 3411.6 [I.V.:3022.5; IV Piggyback:389.1] Out: -  Intake/Output this shift: No intake/output data recorded.  Lab Results: Recent Labs    12/10/24 0908  WBC 5.3  HGB 10.4*  HCT 31.3*  PLT 292   BMET Recent Labs    12/08/24 0412 12/09/24 0742 12/10/24 0342  NA  134* 140 138  K 5.0 4.6 4.4  CL 103 107 104  CO2 21* 21* 24  GLUCOSE 190* 113* 250*  BUN 24* 38* 37*  CREATININE 0.61 0.76 0.73  CALCIUM  8.6* 8.9 8.6*   LFT Recent Labs     12/10/24 0342  PROT 5.8*  ALBUMIN  2.8*  AST 24  ALT 83*  ALKPHOS 154*  BILITOT 0.4    DG Chest Port 1 View Result Date: 12/09/2024 EXAM: 1 VIEW(S) XRAY OF THE CHEST 12/09/2024 09:35:00 AM COMPARISON: 11/27/2024 cxr , ct abd/pelvis 11/27/24 CLINICAL HISTORY: Shortness of breath FINDINGS: LINES, TUBES AND DEVICES: Accessed right Port-A-Cath in place with tip just distal to the cavoatrial junction. Right upper quadrant stent noted. IVC filter noted. LUNGS AND PLEURA: Persistent right middle and lower lobe opacities. No pleural effusion. No pneumothorax. HEART AND MEDIASTINUM: No acute abnormality of the cardiac and mediastinal silhouettes. BONES AND SOFT TISSUES: Subcutaneous emphysema over the lower neck. No acute osseous abnormality. IMPRESSION: 1. Persistent right middle and lower lobe opacities. 2. Subcutaneous emphysema over the lower neck/shoulder. Electronically signed by: Morgane Naveau MD 12/09/2024 01:46 PM EST RP Workstation: [id number]    PRIOR IMAGE STUDIES:  CT Abdo/pelvis with contrast 11/27/2024 1. Dilated proximal small bowel, suggesting small bowel obstruction, although without a discrete transition point. 2. Thick-walled gastric body/antrum in this patient with history of gastric cancer, without definite focal mass, with mild nodularity along the pylorus. 3. Scarring with superimposed patchy peribronchovascular nodularity in the bilateral lower lobes, right greater than left, suggesting mild aspiration/pneumonia. 4. Additional ancillary findings, as above.   Upper GI series with small bowel follow-through 11/12/2024 1. Persistent narrowing at the gastric antrum and corresponds with the wall thickening on the recent CT. There are filling defects at this area of narrowing that could represent gastric contents but indeterminate. This could be further evaluated with direct visualization. 2. Normal gastric emptying. 3. Dilated duodenum and proximal jejunum. Contrast  moved into the right colon after 3 hours. No evidence for a bowel obstruction  MOST RECENT GI PROCEDURES:  EGD 11/30/2024 per Dr. Wilhelmenia: - LA Grade D esophagitis throughout.  - Z-line irregular.  - Granular, inflamed and texture changed mucosa in the stomach  - linnitus pattern.  - Retained gastric fluid - suctioned via endoscope.  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 Prosthesis placed.  - Duodenal dilation deformity throughout.  - Retained food in the duodenum.  - NGT decompression tube placement was successfully performed.  - If issues persist, then SB protocol to evaluate for other areas more distal in the jejunum that could be kinked/obstructed will need to be considered.   EGD 11/18/2024 - LA Grade D reflux esophagitis with no bleeding. Bx-squamous mucosa with focal ulceration. - Small hiatal hernia.  - Malignant infiltrative gastric tumor in the gastric body and in the gastric antrum with antral narrowing. The tumor has definitely progressed as compared to previous EGD. 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    Assessment / Plan:  58 year old male with stage IV gastric adenocarcinoma admitted with intractable N/V with gastric outlet/antral obstruction. S/P EGD with gastric stent placement 12/16. On TPN. Patient developed hematemesis 12/25 x 1 episode and several episodes of hemoptysis. Octreotide  restarted. Hg 12 -> 10.4.  Hemodynamically stable. - NPO - IV fluids per the hospitalist  - Continue PPI IV bid - Continue Promethazine  25 mg IV every 6 hours  as needed - Continue Octreotide  infusion at 50 mcg/hr - Transfuse for Hg level < 7 or as needed if symptomatic  - ? Chest CT, defer to Dr. Kathrin - ? Carafate  slurry 1 gm po tid - Defer further recommendations to Dr. Avram   Dilated SB loops concerning for an ileus,  likely d/t known peritoneal carcinomatosis, impaired peristalsis. Neg previous small bowel series and Gastrografin  study this admission.   History of a DVT s/p IVC filter, not on AC  Possible aspiration pneumonitis/pneumonia. CXR showed patchy airspace opacities in RML. CTAP showed bibasilar infiltrate.  Treated with antibiotic x 7 days.   Elevated LFTs. ALT 99 -> 83. Alk phos 191 -> 154. Normal T. Bili and AST levels.  CTAP 11/27/2024 showed a normal liver and gallbladder without biliary ductal dilatation.    LOS: 12 days   Elida CHRISTELLA Shawl  12/10/2024, 12:54 PM    [facility] Attending   I have taken an interval history, reviewed the chart and examined the patient. I agree with the Advanced Practitioner's note, impression and recommendations as written.  The majority of the medical decision making was performed by me.  Here are my additions to the note:  Unfortunate man with stage IV adenocarcinoma of the stomach status post endoluminal stenting of who was having hematemesis and what sounds like cough induced regurgitation or emesis that is also bloody.  Hemoglobin has declined slightly since December 23 going from 12-10.4.  The patient is facing long odds it would seem.  I am going to check a KUB to see if there is evidence of a small bowel obstruction and to check the stent position.  The stent could have migrated.  I have looked at the chest x-ray from yesterday and it may not be in the field but I do not think I see the stent.  He is currently continuing with treatment of his cancer.  Dr. Cloretta of oncology has had some discussions with him about goals of care and palliative care is working with him and I think that makes sense.  My sense is there will be little we can do to change the overall trajectory, and if he is bleeding from tumor as suspected there are no good long-term solutions for that though if he continues to pursue treatment EGD could be needed.  Also could need CT  scanning.  Reviewed this with the wife the patient was sleepy during my interview though he did awaken for examination.  Lupita CHARLENA Avram, MD, [facility] See TRACEY on call - gastroenterology for best contact person 12/10/2024 4:44 PM     "

## 2024-12-11 ENCOUNTER — Inpatient Hospital Stay (HOSPITAL_COMMUNITY)

## 2024-12-11 DIAGNOSIS — K311 Adult hypertrophic pyloric stenosis: Secondary | ICD-10-CM | POA: Diagnosis not present

## 2024-12-11 DIAGNOSIS — R710 Precipitous drop in hematocrit: Secondary | ICD-10-CM | POA: Diagnosis not present

## 2024-12-11 DIAGNOSIS — R509 Fever, unspecified: Secondary | ICD-10-CM | POA: Diagnosis not present

## 2024-12-11 DIAGNOSIS — C163 Malignant neoplasm of pyloric antrum: Secondary | ICD-10-CM | POA: Diagnosis not present

## 2024-12-11 DIAGNOSIS — K920 Hematemesis: Secondary | ICD-10-CM | POA: Diagnosis not present

## 2024-12-11 DIAGNOSIS — T797XXA Traumatic subcutaneous emphysema, initial encounter: Secondary | ICD-10-CM

## 2024-12-11 LAB — COMPREHENSIVE METABOLIC PANEL WITH GFR
ALT: 44 U/L (ref 0–44)
AST: 17 U/L (ref 15–41)
Albumin: 2.5 g/dL — ABNORMAL LOW (ref 3.5–5.0)
Alkaline Phosphatase: 110 U/L (ref 38–126)
Anion gap: 10 (ref 5–15)
BUN: 35 mg/dL — ABNORMAL HIGH (ref 6–20)
CO2: 26 mmol/L (ref 22–32)
Calcium: 8 mg/dL — ABNORMAL LOW (ref 8.9–10.3)
Chloride: 106 mmol/L (ref 98–111)
Creatinine, Ser: 0.75 mg/dL (ref 0.61–1.24)
GFR, Estimated: >60 mL/min
Glucose, Bld: 270 mg/dL — ABNORMAL HIGH (ref 70–99)
Potassium: 3.8 mmol/L (ref 3.5–5.1)
Sodium: 141 mmol/L (ref 135–145)
Total Bilirubin: 0.5 mg/dL (ref 0.0–1.2)
Total Protein: 5.1 g/dL — ABNORMAL LOW (ref 6.5–8.1)

## 2024-12-11 LAB — GLUCOSE, CAPILLARY
Glucose-Capillary: 112 mg/dL — ABNORMAL HIGH (ref 70–99)
Glucose-Capillary: 131 mg/dL — ABNORMAL HIGH (ref 70–99)
Glucose-Capillary: 163 mg/dL — ABNORMAL HIGH (ref 70–99)
Glucose-Capillary: 165 mg/dL — ABNORMAL HIGH (ref 70–99)
Glucose-Capillary: 232 mg/dL — ABNORMAL HIGH (ref 70–99)
Glucose-Capillary: 71 mg/dL (ref 70–99)

## 2024-12-11 LAB — CBC WITH DIFFERENTIAL/PLATELET
Abs Immature Granulocytes: 0.05 K/uL (ref 0.00–0.07)
Basophils Absolute: 0 K/uL (ref 0.0–0.1)
Basophils Relative: 1 %
Eosinophils Absolute: 0 K/uL (ref 0.0–0.5)
Eosinophils Relative: 1 %
HCT: 25.8 % — ABNORMAL LOW (ref 39.0–52.0)
Hemoglobin: 8.3 g/dL — ABNORMAL LOW (ref 13.0–17.0)
Immature Granulocytes: 1 %
Lymphocytes Relative: 7 %
Lymphs Abs: 0.4 K/uL — ABNORMAL LOW (ref 0.7–4.0)
MCH: 30.5 pg (ref 26.0–34.0)
MCHC: 32.2 g/dL (ref 30.0–36.0)
MCV: 94.9 fL (ref 80.0–100.0)
Monocytes Absolute: 0.1 K/uL (ref 0.1–1.0)
Monocytes Relative: 3 %
Neutro Abs: 4.6 K/uL (ref 1.7–7.7)
Neutrophils Relative %: 87 %
Platelets: 251 K/uL (ref 150–400)
RBC: 2.72 MIL/uL — ABNORMAL LOW (ref 4.22–5.81)
RDW: 14.9 % (ref 11.5–15.5)
WBC: 5.2 K/uL (ref 4.0–10.5)
nRBC: 0.8 % — ABNORMAL HIGH (ref 0.0–0.2)

## 2024-12-11 LAB — PHOSPHORUS: Phosphorus: 1.9 mg/dL — ABNORMAL LOW (ref 2.5–4.6)

## 2024-12-11 LAB — MAGNESIUM: Magnesium: 1.7 mg/dL (ref 1.7–2.4)

## 2024-12-11 LAB — PROTIME-INR
INR: 1.1 (ref 0.8–1.2)
Prothrombin Time: 15.3 s — ABNORMAL HIGH (ref 11.4–15.2)

## 2024-12-11 LAB — PRO BRAIN NATRIURETIC PEPTIDE: Pro Brain Natriuretic Peptide: 244 pg/mL

## 2024-12-11 MED ORDER — POTASSIUM PHOSPHATES 15 MMOLE/5ML IV SOLN
30.0000 mmol | Freq: Once | INTRAVENOUS | Status: AC
  Administered 2024-12-11: 30 mmol via INTRAVENOUS
  Filled 2024-12-11: qty 10

## 2024-12-11 MED ORDER — SODIUM CHLORIDE 0.9 % IV SOLN
3.0000 g | Freq: Four times a day (QID) | INTRAVENOUS | Status: AC
  Administered 2024-12-11 – 2024-12-17 (×27): 3 g via INTRAVENOUS
  Filled 2024-12-11 (×7): qty 8

## 2024-12-11 MED ORDER — TRAVASOL 10 % IV SOLN
INTRAVENOUS | Status: AC
  Filled 2024-12-11: qty 1080

## 2024-12-11 NOTE — Progress Notes (Addendum)
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  This is concerning for more deterioration of his situation especially given the fever and the bleeding.  Will order CT of the chest abdomen and pelvis with IV contrast.  [name], MD, [facility] See TRACEY on call - gastroenterology for best contact person 12/11/2024 3:01 PM    Subjective  Several episodes of hematemesis again since yesterday.  Daughter in room wife not there.  Patient more alert.  Denies significant abdominal pain. Fever overnight   Objective  BP 109/64 (BP Location: Right Arm)   Pulse 78   Temp 98.6 F (37 C) (Oral)   Resp 19   Ht 6' (1.829 m)   Wt 74.7 kg   SpO2 100%   BMI 22.34 kg/m  Chronically ill Hispanic man Abdomen soft nontender no mass  Recent Labs  Lab 12/07/24 0739 12/10/24 0908 12/11/24 0337  HGB 12.0* 10.4* 8.3*  HCT 35.6* 31.3* 25.8*  WBC 3.8* 5.3 5.2  PLT 292 292 251   Recent Labs  Lab 12/06/24 0748 12/08/24 0412 12/09/24 0742 12/10/24 0342 12/11/24 0337  AST 48* 26 31 24 17   ALT 189* 116* 99* 83* 44  ALKPHOS 169* 171* 191* 154* 110  BILITOT 0.6 0.5 0.7 0.4 0.5  PROT 6.3* 6.0* 6.5 5.8* 5.1*  ALBUMIN  3.2* 3.0* 3.2* 2.8* 2.5*  INR  --   --   --   --  1.1    Recent Labs  Lab 12/07/24 0739 12/08/24 0412 12/09/24 0742 12/10/24 0342 12/11/24 0337  NA 135 134* 140 138 141  K 4.4 5.0 4.6 4.4 3.8  CL 102 103 107 104  106  CO2 23 21* 21* 24 26  GLUCOSE 144* 190* 113* 250* 270*  BUN 23* 24* 38* 37* 35*  CREATININE 0.67 0.61 0.76 0.73 0.75  CALCIUM  8.6* 8.6* 8.9 8.6* 8.0*  MG 2.2 2.4 2.2 2.1 1.7  PHOS 3.1 2.3* 1.9* 2.2* 1.9*        [name], MD, [facility] See AMION on call - gastroenterology for best contact person 12/11/2024 2:49 PM   "

## 2024-12-11 NOTE — Progress Notes (Signed)
 " PROGRESS NOTE  Henry May FMW:[id number] DOB: 04-09-1966   PCP: [name], DO  Patient is from: Home.  Lives with wife and daughter.  DOA: 11/27/2024 LOS: 13  Chief complaints No chief complaint on file.    Brief Narrative / Interim history: 58 year old M with PMH of DVT with IVC filter, gastric cancer on chemo and esophagitis presented to [facility] due to nausea, vomiting and coffee-ground emesis 2 days after he started new chemotherapy, and admitted for intractable nausea and vomiting, dilated small bowel loops and coffee-ground emesis.  Recent EGD on 12/4 with LA grade D esophagitis without bleeding, and large infiltrative, sessile and ulcerated circumferential mass with contact of bleeding in gastric body and antrum with antral narrowing and luminal diameter of approximately 1.5 cm  In ED, stable vitals.  CBC and CMP without significant finding.  Lipase 171.  CT abdomen and pelvis showed dilated proximal small bowel suggesting SBO but without discrete transition point, thick walled gastric body/antrum without definite focal mass, mild nodularity along the pylorus, scarring with superimposed patchy peribronchovascular nodularity in the bilateral lower lobes right more than left suggesting mild aspiration or pneumonia.  Patient was started on antiemetics, IV fluid, PPI and admitted for further care.  Oncology, surgery, GI and palliative medicine consulted.  Patient underwent EGD and gastric stent placement on 12/16.  Was unable to tolerate diet.  Started on TPN.  Plan was for discharge with TPN once logistic in place but nausea and vomiting gotten worse after stopping octreotide  and Decadron .  Also having hemoptysis.  Restarted on octreotide  infusion.    Subjective: Seen and examined earlier this morning.  Spiked fever to 101.1 earlier this morning.  Also reports shortness of breath and hematemesis.  No desaturation below 91% but he is oxygen  was titrated to 10 L by HFNC this  morning.  No acute change in chest x-ray.  Assessment and plan: Gastric cancer, gastric luminal narrowing and esophagitis: Intractable nausea and vomiting: Likely due to the above. Acute blood loss anemia due to hematemesis-likely due to bleeding from gastric cancer.  CT with dilated small bowel without transition point.  -S/p EGD and gastric stenting in 12/16. -Remains n.p.o. except ice chips and sips with meds. -Continue TPN per pharmacy.  -Continue IV Phenergan , Zofran  and Ativan . -Resumed octreotide  infusion on 12/25.  Will continue. -Continue IV Protonix  40 mg twice daily -Appreciate input by GI. -Continue monitoring CBC. -Palliative following.  Gastric cancer: Recently restarted on chemo.  Followed by Dr. Zorita.  -S/p EGD and gastric stenting on 12/16. -Received cisplatin  chemo on 12/23. -Oncology and palliative medicine on board. -Supportive care as above.  Aspiration pneumonitis/pneumonia: Initial CXR showed patchy airspace opacities in RML.  CT abdomen and pelvis with bibasilar infiltrate. Completed 7 days of appropriate antibiotics.  Repeat CXR on 12/25 with persistent RML and RLL opacities likely from ongoing aspiration pneumonitis.  Now spiking fever.  Repeat chest x-ray with stable right lung opacities/infiltrate. -Restart IV Unasyn  -Wean oxygen  as able   Diet controlled diabetes: A1c 5.7%.  Not on medication at home. -Continue SSI sensitive.   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  Improved.  Insomnia mainly due to intractable nausea and vomiting - IV Ativan  and Phenergan  as above.  Lightheadedness/presyncope: Likely due to intractable nausea, vomiting and hematemesis. - IV fluid as above  Severe malnutrition Body mass index is 22.34 kg/m. Nutrition Problem: Severe Malnutrition Etiology: chronic illness (gastric cancer) Signs/Symptoms:  severe fat depletion, severe muscle depletion, percent  weight loss (24% weight loss in less than 3 months) Percent weight loss: 24 % (in < 3 months) Interventions: Refer to RD note for recommendations   DVT prophylaxis:  SCDs Start: 11/28/24 1042  Code Status: Full code Family Communication: Updated patient's wife at bedside. Level of care: Progressive Status is: Inpatient Remains inpatient appropriate because: Intractable nausea and vomiting, hematemesis and gastric cancer   Final disposition: To be determined.   55 minutes with more than 50% spent in reviewing records, counseling patient/family and coordinating care.  Consultants:  Gastroenterology General Surgery Oncology Palliative medicine  Procedures: 12/16-EGD and gastric stenting  Microbiology summarized: None  Objective: Vitals:   12/11/24 0457 12/11/24 0500 12/11/24 0925 12/11/24 1327  BP: (!) 102/55  (!) 95/58 109/64  Pulse: (!) 115  87 78  Resp: 17  16 19   Temp: (!) 101.1 F (38.4 C)  97.8 F (36.6 C) 98.6 F (37 C)  TempSrc: Oral   Oral  SpO2: 91%  93% 100%  Weight:  74.7 kg    Height:        Examination:  GENERAL: No apparent distress.  Appears frail. HEENT: MMM.  Vision and hearing grossly intact.  NECK: Supple.  No apparent JVD.  RESP:  No IWOB.  Fair aeration bilaterally. CVS:  RRR. Heart sounds normal.  ABD/GI/GU: BS+. Abd soft, NTND.  MSK/EXT:  Moves extremities. No apparent deformity. No edema.  SKIN: no apparent skin lesion or wound NEURO: AA.  Oriented appropriately.  No apparent focal neuro deficit. PSYCH: Calm. Normal affect.   Sch Meds:  Scheduled Meds:  Chlorhexidine  Gluconate Cloth  6 each Topical Daily   insulin  aspart  0-15 Units Subcutaneous Q4H   mouth rinse  15 mL Mouth Rinse 4 times per day   pantoprazole  (PROTONIX ) IV  40 mg Intravenous Q12H   Continuous Infusions:  ampicillin -sulbactam (UNASYN ) IV 3 g (12/11/24 0938)   lactated ringers  100 mL/hr at 12/11/24 0552   octreotide  (SANDOSTATIN ) 500 mcg in sodium chloride   0.9 % 250 mL (2 mcg/mL) infusion 50 mcg/hr (12/11/24 1122)   potassium PHOSPHATE  IVPB (in mmol) 30 mmol (12/11/24 1123)   promethazine  (PHENERGAN ) injection (IM or IVPB) Stopped (12/11/24 0047)   TPN CYCLIC-ADULT (ION)     PRN Meds:.acetaminophen  **OR** acetaminophen , alum & mag hydroxide-simeth, labetalol , LORazepam , menthol , mouth rinse, phenol, promethazine  (PHENERGAN ) injection (IM or IVPB), sodium chloride  flush  Antimicrobials: Anti-infectives (From admission, onward)    Start     Dose/Rate Route Frequency Ordered Stop   12/11/24 0900  Ampicillin -Sulbactam (UNASYN ) 3 g in sodium chloride  0.9 % 100 mL IVPB        3 g 200 mL/hr over 30 Minutes Intravenous Every 6 hours 12/11/24 0817     11/28/24 1200  Ampicillin -Sulbactam (UNASYN ) 3 g in sodium chloride  0.9 % 100 mL IVPB  Status:  Discontinued        3 g 200 mL/hr over 30 Minutes Intravenous Every 6 hours 11/28/24 1048 12/05/24 0909   11/28/24 0045  Ampicillin -Sulbactam (UNASYN ) 3 g in sodium chloride  0.9 % 100 mL IVPB        3 g 200 mL/hr over 30 Minutes Intravenous  Once 11/28/24 0042 11/28/24 0134        I have personally reviewed the following labs and images: CBC: Recent Labs  Lab 12/07/24 0739 12/10/24 0908 12/11/24 0337  WBC 3.8* 5.3 5.2  NEUTROABS 2.6 4.3 4.6  HGB 12.0* 10.4* 8.3*  HCT  35.6* 31.3* 25.8*  MCV 89.7 91.3 94.9  PLT 292 292 251   BMP &GFR Recent Labs  Lab 12/07/24 0739 12/08/24 0412 12/09/24 0742 12/10/24 0342 12/11/24 0337  NA 135 134* 140 138 141  K 4.4 5.0 4.6 4.4 3.8  CL 102 103 107 104 106  CO2 23 21* 21* 24 26  GLUCOSE 144* 190* 113* 250* 270*  BUN 23* 24* 38* 37* 35*  CREATININE 0.67 0.61 0.76 0.73 0.75  CALCIUM  8.6* 8.6* 8.9 8.6* 8.0*  MG 2.2 2.4 2.2 2.1 1.7  PHOS 3.1 2.3* 1.9* 2.2* 1.9*   Estimated Creatinine Clearance: 103.8 mL/min (by C-G formula based on SCr of 0.75 mg/dL). Liver & Pancreas: Recent Labs  Lab 12/06/24 0748 12/08/24 0412 12/09/24 0742 12/10/24 0342  12/11/24 0337  AST 48* 26 31 24 17   ALT 189* 116* 99* 83* 44  ALKPHOS 169* 171* 191* 154* 110  BILITOT 0.6 0.5 0.7 0.4 0.5  PROT 6.3* 6.0* 6.5 5.8* 5.1*  ALBUMIN  3.2* 3.0* 3.2* 2.8* 2.5*   No results for input(s): LIPASE, AMYLASE in the last 168 hours.  No results for input(s): AMMONIA in the last 168 hours. Diabetic: No results for input(s): HGBA1C in the last 72 hours.  Recent Labs  Lab 12/10/24 2011 12/10/24 2348 12/11/24 0458 12/11/24 0746 12/11/24 1148  GLUCAP 183* 212* 232* 131* 71   Cardiac Enzymes: No results for input(s): CKTOTAL, CKMB, CKMBINDEX, TROPONINI in the last 168 hours. Recent Labs    12/11/24 1208  PROBNP 244.0   Coagulation Profile: Recent Labs  Lab 12/11/24 0337  INR 1.1    Thyroid  Function Tests: No results for input(s): TSH, T4TOTAL, FREET4, T3FREE, THYROIDAB in the last 72 hours. Lipid Profile: No results for input(s): CHOL, HDL, LDLCALC, TRIG, CHOLHDL, LDLDIRECT in the last 72 hours.  Anemia Panel: No results for input(s): VITAMINB12, FOLATE, FERRITIN, TIBC, IRON, RETICCTPCT in the last 72 hours. Urine analysis:    Component Value Date/Time   COLORURINE YELLOW 11/04/2024 1812   APPEARANCEUR CLEAR 11/04/2024 1812   LABSPEC 1.025 11/04/2024 1812   PHURINE 5.5 11/04/2024 1812   GLUCOSEU NEGATIVE 11/04/2024 1812   HGBUR NEGATIVE 11/04/2024 1812   BILIRUBINUR NEGATIVE 11/04/2024 1812   KETONESUR 15 (A) 11/04/2024 1812   PROTEINUR TRACE (A) 11/04/2024 1812   NITRITE NEGATIVE 11/04/2024 1812   LEUKOCYTESUR NEGATIVE 11/04/2024 1812   Sepsis Labs: Invalid input(s): PROCALCITONIN, LACTICIDVEN  Microbiology: No results found for this or any previous visit (from the past 240 hours).  Radiology Studies: DG Chest Port 1 View Result Date: 12/11/2024 CLINICAL DATA:  Hypoxemia. EXAM: PORTABLE CHEST 1 VIEW COMPARISON:  December 09, 2024 FINDINGS: There is stable right-sided venous  Port-A-Cath positioning. The heart size and mediastinal contours are within normal limits. Mild infiltrate is seen within the left lung base which represents a new finding when compared to the prior study. Mild, stable areas of airspace disease are seen within the right infrahilar region and lateral aspect of the right lung base. No pleural effusion or pneumothorax is identified. A mild amount of supraclavicular soft tissue air is noted on the left. This is stable in appearance when compared to the prior study. The visualized skeletal structures are unremarkable. IMPRESSION: 1. Mild left basilar infiltrate. 2. Stable right infrahilar and right basilar airspace disease. 3. Stable left supraclavicular soft tissue air. Electronically Signed   By: Suzen Dials M.D.   On: 12/11/2024 11:34   DG Abd Portable 1V Result Date: 12/11/2024 CLINICAL DATA:  Hematemesis.  EXAM: PORTABLE ABDOMEN - 1 VIEW COMPARISON:  12/02/2024 FINDINGS: No gaseous small bowel dilatation to suggest obstruction. Colon is nondilated. Contrast material is seen scattered along the length of the colon from the cecum to the distal descending segment. Gastric stent device is overlies the T12 vertebral body compatible with placement in the region of the gastric antrum and stable since chest x-ray of 12/09/2024. IMPRESSION: 1. Nonobstructive bowel gas pattern. 2. Gastric stent device overlies the T12 vertebral body consistent with placement in the gastric antrum and stable since chest x-ray of 12/09/2024. Electronically Signed   By: Camellia Candle M.D.   On: 12/11/2024 07:10       Taira Knabe T. Laurice Kimmons [facility]  If 7PM-7AM, please contact night-coverage [email or web address] 12/11/2024, 2:14 PM   "

## 2024-12-11 NOTE — Progress Notes (Signed)
 At 1618: Patient had another bowel movement. The bowel movement was Type 6, brown in color, and a large amount.   At 1624: RN was able to wean patient back down to 2 liters of oxygen  with spO2 being 98%.

## 2024-12-11 NOTE — Progress Notes (Signed)
 RN made MD aware that patient had a bowel movement that looked white in color with small pieces and is passing gas.

## 2024-12-11 NOTE — Plan of Care (Signed)
" °  Problem: Clinical Measurements: Goal: Respiratory complications will improve Outcome: Progressing Goal: Cardiovascular complication will be avoided Outcome: Progressing   Problem: Coping: Goal: Level of anxiety will decrease Outcome: Progressing   Problem: Elimination: Goal: Will not experience complications related to bowel motility Outcome: Progressing Goal: Will not experience complications related to urinary retention Outcome: Progressing   Problem: Pain Managment: Goal: General experience of comfort will improve and/or be controlled Outcome: Progressing   Problem: Safety: Goal: Ability to remain free from injury will improve Outcome: Progressing   Problem: Skin Integrity: Goal: Risk for impaired skin integrity will decrease Outcome: Progressing   Problem: Education: Goal: Ability to describe self-care measures that may prevent or decrease complications (Diabetes Survival Skills Education) will improve Outcome: Progressing Goal: Individualized Educational Video(s) Outcome: Progressing   Problem: Coping: Goal: Ability to adjust to condition or change in health will improve Outcome: Progressing   Problem: Fluid Volume: Goal: Ability to maintain a balanced intake and output will improve Outcome: Progressing   Problem: Health Behavior/Discharge Planning: Goal: Ability to identify and utilize available resources and services will improve Outcome: Progressing Goal: Ability to manage health-related needs will improve Outcome: Progressing   Problem: Skin Integrity: Goal: Risk for impaired skin integrity will decrease Outcome: Progressing   Problem: Tissue Perfusion: Goal: Adequacy of tissue perfusion will improve Outcome: Progressing   Problem: Education: Goal: Knowledge of General Education information will improve Description: Including pain rating scale, medication(s)/side effects and non-pharmacologic comfort measures Outcome: Not Progressing   Problem:  Health Behavior/Discharge Planning: Goal: Ability to manage health-related needs will improve Outcome: Not Progressing   Problem: Clinical Measurements: Goal: Ability to maintain clinical measurements within normal limits will improve Outcome: Not Progressing Goal: Will remain free from infection Outcome: Not Progressing   Problem: Activity: Goal: Risk for activity intolerance will decrease Outcome: Not Progressing   Problem: Nutrition: Goal: Adequate nutrition will be maintained Outcome: Not Progressing   Problem: Metabolic: Goal: Ability to maintain appropriate glucose levels will improve Outcome: Not Progressing   Problem: Nutritional: Goal: Maintenance of adequate nutrition will improve Outcome: Not Progressing Goal: Progress toward achieving an optimal weight will improve Outcome: Not Progressing   "

## 2024-12-11 NOTE — Progress Notes (Signed)
 PHARMACY - TOTAL PARENTERAL NUTRITION CONSULT NOTE   Indication: intolerance to enteral feeds  Patient Measurements: Height: 6' (182.9 cm) Weight: 74.7 kg (164 lb 10.9 oz) IBW/kg (Calculated) : 77.6 TPN AdjBW (KG): 70 Body mass index is 22.34 kg/m. Usual Weight:   Assessment: 58 yoM admitted on 12/13 with intractable nausea and vomiting, dilated small bowel loops, and coffee-ground emesis. 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  He underwent EGD 12/16 showing esophagitis,gastric stensosis,  stent placed, impaired peristalsis.  Trial NG tube clamping and CLD. Pharmacy is consulted to dose TPN on 12/20 for intolerance to enteral feeding.    Glucose / Insulin : No hx DM, no PTA meds. - Dexamethasone  2mg  IV q12h- dc'd 12/24 am  - Dexamethasone  10 mg IV given 12/23 as premed for chemo - Octreotide  infusion resumed 12/25 @50  mcg/hr (noted that Octreotide , especially at higher doses/frequencies, can cause glucose dysregulation). Discussed with MD about transitioning back to Octreotide  50mcg q8h, however, patient with hematemesis 12/25 PM so continuing with Octreotide  infusion for now.  - On sSSI, has used 15 units/24 hours, CBGs 82 - 270 with all high CBGs being during TPN administration (goal <150) Electrolytes: Phos 1.9 (low), Mg 1.7 (lower end of normal range), all other lytes WNL including CoCa 9.0 Renal: SCr < 1 (stable), BUN 35 (elevated but slightly improved) Hepatic: Albumin  2.5 (low), all other hepatic labs WNL - Trig 164 12/23 Intake / Output; MIVF: LR @100ml /hr thru 12/27 @1759  - I/O: +2,607 mL/24 hrs - Emesis: x1 occurrence - Urine output: x1 occurrence  - LBM: 12/26 GI Imaging: - 12/18 Abd xray: No bowel dilatation or evidence of obstruction. Retained gastric fluid. 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 Prosthesis placed. Duodenal dilation deformity  - 12/25 DG abd: nonobstructive bowel gas pattern GI Surgeries / Procedures:   - 12/16 EGD: esophagitis  GI meds:   - Protonix  40mg  IV  q12h >> - Octreotide  50 sq q8hrs >> DC'd 12/24; resumed 12/25 @50mcg /hr  - Decadron  2 gm IV q12hrs >> DC'd 12/24  Central access: Implanted Port (01/22/24) TPN start date: 12/20   Nutritional Goals: Goal TPN rate is 90 mL/hr and provides 108 g of protein and 2182 kcals per day)  RD Assessment: Estimated Needs Total Energy Estimated Needs: 2100-2450 kcals Total Protein Estimated Needs: 105-120 grams Total Fluid Estimated Needs: >/= 2.1L  Current Nutrition:  Diet: NPO, Cyclic TPN  Plan:  Now - KPhos 30 mmol IV x 1 - TPN pending insurance approval for outpatient use - GOC discussions ongoing with family  At 1800: - Change TPN to cycle over 18 hrs (glucose infusion rate 2.14 - 4.25 mg/kg/min) - Electrolytes in TPN:  Na 75 mEq/L - slightly decreased (continuous Octreotide  infusion, LR, Unasyn , and IV NaPhos x1 all ordered, so will monitor Na closely) K 50 mEq/L - increased Ca 5 mEq/L Mg 5 mEq/L - increased Phos 25 mmol/L - maxed out Cl:Ac to 1:2 - Add standard MVI and trace elements to TPN - Continue moderate intensity SSI q4h given lack of CBG control 12/26 PM. Reducing the TPN infusion duration to 18hrs will hopefully improve CBGs as it will decrease the glucose infusion rate. Can likely transition back to 12hr cyclic TPN once the octreotide  infusion is discontinued. Considered adding insulin  to the TPN today, however, hesitant to do so given CBG 82 right before starting last night's TPN. - Stop LR this evening at 1759 prior to starting TPN - Monitor  LFTs closely - Monitor TPN labs on Mon/Thurs and PRN - Cmet, phos, mag on 12/28   Lacinda Moats, PharmD Clinical Pharmacist  12/27/20258:06 AM

## 2024-12-11 NOTE — Progress Notes (Signed)
 MD made aware that patient was on 2 liters this morning, but patient had to be put on 10 liters HFNC due to spO2 dropping down to 80%. Once patient was on 10 liters HFNC, patient's spO2 came up to 93/94%. One of the family members at the bedside had told RN that patient had just thrown up.

## 2024-12-12 ENCOUNTER — Encounter: Payer: Self-pay | Admitting: Oncology

## 2024-12-12 ENCOUNTER — Inpatient Hospital Stay (HOSPITAL_COMMUNITY)

## 2024-12-12 DIAGNOSIS — Z711 Person with feared health complaint in whom no diagnosis is made: Secondary | ICD-10-CM | POA: Diagnosis not present

## 2024-12-12 DIAGNOSIS — C169 Malignant neoplasm of stomach, unspecified: Secondary | ICD-10-CM | POA: Diagnosis not present

## 2024-12-12 DIAGNOSIS — Z515 Encounter for palliative care: Secondary | ICD-10-CM | POA: Diagnosis not present

## 2024-12-12 DIAGNOSIS — R509 Fever, unspecified: Secondary | ICD-10-CM | POA: Diagnosis not present

## 2024-12-12 DIAGNOSIS — Z7189 Other specified counseling: Secondary | ICD-10-CM | POA: Diagnosis not present

## 2024-12-12 DIAGNOSIS — K56609 Unspecified intestinal obstruction, unspecified as to partial versus complete obstruction: Secondary | ICD-10-CM | POA: Diagnosis not present

## 2024-12-12 DIAGNOSIS — T797XXA Traumatic subcutaneous emphysema, initial encounter: Secondary | ICD-10-CM

## 2024-12-12 DIAGNOSIS — K920 Hematemesis: Secondary | ICD-10-CM | POA: Diagnosis not present

## 2024-12-12 DIAGNOSIS — C163 Malignant neoplasm of pyloric antrum: Secondary | ICD-10-CM | POA: Diagnosis not present

## 2024-12-12 LAB — CBC
HCT: 20.7 % — ABNORMAL LOW (ref 39.0–52.0)
Hemoglobin: 6.7 g/dL — CL (ref 13.0–17.0)
MCH: 30.7 pg (ref 26.0–34.0)
MCHC: 32.4 g/dL (ref 30.0–36.0)
MCV: 95 fL (ref 80.0–100.0)
Platelets: 227 K/uL (ref 150–400)
RBC: 2.18 MIL/uL — ABNORMAL LOW (ref 4.22–5.81)
RDW: 15.2 % (ref 11.5–15.5)
WBC: 6.6 K/uL (ref 4.0–10.5)
nRBC: 0 % (ref 0.0–0.2)

## 2024-12-12 LAB — COMPREHENSIVE METABOLIC PANEL WITH GFR
ALT: 32 U/L (ref 0–44)
AST: 14 U/L — ABNORMAL LOW (ref 15–41)
Albumin: 2.3 g/dL — ABNORMAL LOW (ref 3.5–5.0)
Alkaline Phosphatase: 94 U/L (ref 38–126)
Anion gap: 6 (ref 5–15)
BUN: 33 mg/dL — ABNORMAL HIGH (ref 6–20)
CO2: 28 mmol/L (ref 22–32)
Calcium: 8.1 mg/dL — ABNORMAL LOW (ref 8.9–10.3)
Chloride: 105 mmol/L (ref 98–111)
Creatinine, Ser: 0.69 mg/dL (ref 0.61–1.24)
GFR, Estimated: >60 mL/min
Glucose, Bld: 259 mg/dL — ABNORMAL HIGH (ref 70–99)
Potassium: 4 mmol/L (ref 3.5–5.1)
Sodium: 140 mmol/L (ref 135–145)
Total Bilirubin: 0.3 mg/dL (ref 0.0–1.2)
Total Protein: 5 g/dL — ABNORMAL LOW (ref 6.5–8.1)

## 2024-12-12 LAB — GLUCOSE, CAPILLARY
Glucose-Capillary: 143 mg/dL — ABNORMAL HIGH (ref 70–99)
Glucose-Capillary: 186 mg/dL — ABNORMAL HIGH (ref 70–99)
Glucose-Capillary: 198 mg/dL — ABNORMAL HIGH (ref 70–99)
Glucose-Capillary: 245 mg/dL — ABNORMAL HIGH (ref 70–99)
Glucose-Capillary: 70 mg/dL (ref 70–99)
Glucose-Capillary: 71 mg/dL (ref 70–99)

## 2024-12-12 LAB — MAGNESIUM: Magnesium: 2 mg/dL (ref 1.7–2.4)

## 2024-12-12 LAB — PHOSPHORUS: Phosphorus: 3.1 mg/dL (ref 2.5–4.6)

## 2024-12-12 LAB — PREPARE RBC (CROSSMATCH)

## 2024-12-12 MED ORDER — IOHEXOL 300 MG/ML  SOLN
100.0000 mL | Freq: Once | INTRAMUSCULAR | Status: AC | PRN
  Administered 2024-12-12: 100 mL via INTRAVENOUS

## 2024-12-12 MED ORDER — SODIUM CHLORIDE 0.9% IV SOLUTION: Freq: Once | INTRAVENOUS | Status: AC

## 2024-12-12 MED ORDER — ONDANSETRON HCL 4 MG/2ML IJ SOLN
4.0000 mg | Freq: Once | INTRAMUSCULAR | Status: AC
  Administered 2024-12-12: 4 mg via INTRAVENOUS
  Filled 2024-12-12: qty 2

## 2024-12-12 MED ORDER — TRAVASOL 10 % IV SOLN
INTRAVENOUS | Status: AC
  Filled 2024-12-12: qty 1080

## 2024-12-12 NOTE — Progress Notes (Signed)
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  No melena or hematochezia  Subcutaneous air seen on chest x-rays December 25 and December 27  Fever yesterday  -----------------------------------------------------------------------------------------------------------------------------------  Retroperitoneal perforation can cause subcutaneous air in the neck and chest area.  This is concerning for more deterioration of his situation especially given the fever and the bleeding.  Await CT of the chest abdomen and pelvis with IV contrast  Await CT results, further palliative care discussions and Dr. Andriette return prior to invasive procedures.  The patient's prognosis appears poor even though he has had an radiographic response to his cancer clinically he seems worse.    Subjective  Less vomiting had 1 episode of hematemesis overnight.  Hemoglobin is lower and transfusion has been ordered.  Stools are brown, wife showed me a picture.  He does not have abdominal pain.  Afebrile overnight.  Objective  BP 107/65 (BP Location: Left Arm)   Pulse 73   Temp 97.6 F (36.4 C) (Oral)   Resp 18   Ht 6' (1.829 m)   Wt 74.7 kg   SpO2 100%   BMI 22.34 kg/m  Chronically ill Hispanic man who is in no acute distress I cannot detect any crepitus, subcutaneous emphysema Abdomen soft nontender no mass  Recent Labs  Lab 12/10/24 0908 12/11/24 0337 12/12/24 0752  HGB 10.4* 8.3* 6.7*  HCT 31.3* 25.8* 20.7*  WBC 5.3 5.2 6.6  PLT 292 251 227   Recent Labs  Lab 12/08/24 0412 12/09/24 0742 12/10/24 0342 12/11/24 0337 12/12/24 0446  AST 26 31 24 17  14*  ALT 116* 99* 83* 44 32  ALKPHOS 171* 191* 154*  110 94  BILITOT 0.5 0.7 0.4 0.5 0.3  PROT 6.0* 6.5 5.8* 5.1* 5.0*  ALBUMIN  3.0* 3.2* 2.8* 2.5* 2.3*  INR  --   --   --  1.1  --     Recent Labs  Lab 12/08/24 0412 12/09/24 0742 12/10/24 0342 12/11/24 0337 12/12/24 0446  NA 134* 140 138 141 140  K 5.0 4.6 4.4 3.8 4.0  CL 103 107 104 106 105  CO2 21* 21* 24 26 28   GLUCOSE 190* 113* 250* 270* 259*  BUN 24* 38* 37* 35* 33*  CREATININE 0.61 0.76 0.73 0.75 0.69  CALCIUM  8.6* 8.9 8.6* 8.0* 8.1*  MG 2.4 2.2 2.1 1.7 2.0  PHOS 2.3* 1.9* 2.2* 1.9* 3.1        Henry CHARLENA Commander, MD, [facility] See Henry May on call - gastroenterology for best contact person 12/12/2024 11:04 AM   "

## 2024-12-12 NOTE — Progress Notes (Signed)
 PHARMACY - TOTAL PARENTERAL NUTRITION CONSULT NOTE   Indication: intolerance to enteral feeds  Patient Measurements: Height: 6' (182.9 cm) Weight: 74.7 kg (164 lb 10.9 oz) IBW/kg (Calculated) : 77.6 TPN AdjBW (KG): 70 Body mass index is 22.34 kg/m. Usual Weight:   Assessment: 58 yoM admitted on 12/13 with intractable nausea and vomiting, dilated small bowel loops, and coffee-ground emesis. 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  He underwent EGD 12/16 showing esophagitis,gastric stensosis,  stent placed, impaired peristalsis.  Trial NG tube clamping and CLD. Pharmacy is consulted to dose TPN on 12/20 for intolerance to enteral feeding.    Glucose / Insulin : No hx DM, no PTA meds. - Dexamethasone  2mg  IV q12h- dc'd 12/24 am  - Dexamethasone  10 mg IV given 12/23 as premed for chemo - Octreotide  infusion resumed 12/25 @50  mcg/hr (noted that Octreotide , especially at higher doses/frequencies, can cause glucose dysregulation). Discussed with MD about transitioning back to Octreotide  50mcg q8h, however, patient with hematemesis so continuing with Octreotide  infusion for now.  - On sSSI, has used 16 units/24 hours, CBGs 71 - 259 with all high CBGs being during TPN administration (goal <150) Electrolytes: All lytes WNL including CoCa 9.3 Renal: SCr < 1 (stable), BUN 33 (elevated but slightly improved) Hepatic: Albumin  2.3 (low), all other hepatic labs WNL - Trig 164 12/23 Intake / Output - MIVF: none - I/O: +1,694 mL/24 hrs - Emesis: x2 occurrence - Urine output: x2 occurrence  - LBM: 12/27 GI Imaging: - 12/18 Abd xray: No bowel dilatation or evidence of obstruction. Retained gastric fluid. 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 Prosthesis placed. Duodenal dilation deformity  - 12/25 DG abd: nonobstructive bowel gas pattern - 12/27 DG abd: nonobstructive bowel gas pattern GI Surgeries / Procedures:  - 12/16 EGD: esophagitis  GI meds:    - Protonix  40mg  IV  q12h >> - Octreotide  50 sq q8hrs >> DC'd 12/24; resumed 12/25 @50mcg /hr  - Decadron  2 gm IV q12hrs >> DC'd 12/24  Central access: Implanted Port (01/22/24) TPN start date: 12/20   Nutritional Goals: Goal TPN rate is 90 mL/hr and provides 108 g of protein and 2182 kcals per day)  RD Assessment: Estimated Needs Total Energy Estimated Needs: 2100-2450 kcals Total Protein Estimated Needs: 105-120 grams Total Fluid Estimated Needs: >/= 2.1L  Current Nutrition:  Diet: NPO, Cyclic TPN  Plan:  Now - TPN pending insurance approval for outpatient use - GOC discussions ongoing with family  At 1800: - Continue TPN to cycle over 18 hrs (glucose infusion rate 2.14 - 4.25 mg/kg/min) - Electrolytes in TPN:  Na 75 mEq/L K 50 mEq/L Ca 5 mEq/L Mg 5 mEq/L Phos 25 mmol/L - maxed out Cl:Ac to 1:2 - Add standard MVI and trace elements to TPN - Continue moderate intensity SSI q4h given lack of CBG control overnight. Reducing the TPN infusion duration to 18hrs did slightly improve CBGs given the decrease in glucose infusion rate. Can likely transition back to 12hr cyclic TPN once the octreotide  infusion is discontinued. If patient continues to be hospitalized and on Octreotide  infusion, may need to consider reverting back to continuous TPN given continued lack of CBG control. Considered adding insulin  to the TPN today, however, hesitant to do so given CBGs 71, 112 prior to starting TPN yesterday. - Monitor TPN labs on Mon/Thurs and PRN   Lacinda Moats, PharmD Clinical Pharmacist  12/28/202510:11 AM

## 2024-12-12 NOTE — Plan of Care (Signed)

## 2024-12-12 NOTE — Progress Notes (Signed)
 " PROGRESS NOTE  Henry May FMW:[id number] DOB: 11-26-1966   PCP: [name], DO  Patient is from: Home.  Lives with wife and daughter.  DOA: 11/27/2024 LOS: 14  Chief complaints No chief complaint on file.    Brief Narrative / Interim history: 58 year old M with PMH of DVT with IVC filter, gastric cancer on chemo and esophagitis presented to [facility] due to nausea, vomiting and coffee-ground emesis 2 days after he started new chemotherapy, and admitted for intractable nausea and vomiting, dilated small bowel loops and coffee-ground emesis.  Recent EGD on 12/4 with LA grade D esophagitis without bleeding, and large infiltrative, sessile and ulcerated circumferential mass with contact of bleeding in gastric body and antrum with antral narrowing and luminal diameter of approximately 1.5 cm  In ED, stable vitals.  CBC and CMP without significant finding.  Lipase 171.  CT abdomen and pelvis showed dilated proximal small bowel suggesting SBO but without discrete transition point, thick walled gastric body/antrum without definite focal mass, mild nodularity along the pylorus, scarring with superimposed patchy peribronchovascular nodularity in the bilateral lower lobes right more than left suggesting mild aspiration or pneumonia.  Patient was started on antiemetics, IV fluid, PPI and admitted for further care.  Oncology, surgery, GI and palliative medicine consulted.  Patient underwent EGD and gastric stent placement on 12/16.  Was unable to tolerate diet.  Started on TPN.  Plan was for discharge with TPN once logistic in place but nausea and vomiting gotten worse after stopping octreotide  and Decadron .  Also having hemoptysis with drop in hemoglobin.  Restarted on octreotide  infusion.  GI reengaged.    Subjective: Seen and examined earlier this morning.  No major events overnight or this morning.  Patient's wife at bedside.  She reports that he is feeling better today.  She says he had  one episode of emesis last night reports feeling better today.  She describes the emesis as liquid and dark red but bright red blood.  She also reports bowel movement that she describes are clear liquid with white clumps.  No blood in the stool.  Hemoglobin dropped to 6.7 this morning  Assessment and plan: Gastric cancer, gastric luminal narrowing and esophagitis: Intractable nausea and vomiting: Likely due to the above. Acute blood loss anemia due to hematemesis-likely due to bleeding from gastric cancer. -Initial CT with dilated small bowel without transition point.  -S/p EGD and gastric stenting in 12/16. -Remains n.p.o. except ice chips and sips with meds. -Continue TPN per pharmacy.  -Continue IV Phenergan , Zofran  and Ativan . -Resumed octreotide  infusion on 12/25.  Will continue. -Continue IV Protonix  40 mg twice daily -Transfused 2 units of blood. -Appreciate input by GI-who ordered CT chest, abdomen and pelvis -Continue monitoring CBC. -Palliative following.  Gastric cancer: Recently restarted on chemo.  Followed by Dr. Zorita.  -S/p EGD and gastric stenting on 12/16. -Received cisplatin  chemo on 12/23. -Follow repeat CT chest, abdomen and pelvis. -Oncology and palliative medicine on board. -Supportive care as above.  Aspiration pneumonitis/pneumonia: Initial CXR showed patchy airspace opacities in RML.  CT abdomen and pelvis with bibasilar infiltrate. Completed 7 days of appropriate antibiotics.  Repeat CXR on 12/25 with persistent RML and RLL opacities likely from ongoing aspiration pneumonitis.  Now spiking fever.  Repeat chest x-ray with stable right lung opacities/infiltrate. -Restart IV Unasyn  -Wean oxygen  as able   Diet controlled diabetes with hyperglycemia: A1c 5.7%.  Not on medication at home.  Hyperglycemia likely due to octreotide  -TPN per pharmacy.  Can add insulin  to TPN if needed -Continue SSI sensitive.   History of DVT (deep vein thrombosis) -S/p IVC  filter  Elevated lipase: Likely due to the above.  Improved.  Insomnia mainly due to intractable nausea and vomiting - IV Ativan  and Phenergan  as above.  Lightheadedness/presyncope: Likely due to intractable nausea, vomiting and hematemesis. - IV fluid as above  Severe malnutrition Body mass index is 22.34 kg/m. Nutrition Problem: Severe Malnutrition Etiology: chronic illness (gastric cancer) Signs/Symptoms: severe fat depletion, severe muscle depletion, percent weight loss (24% weight loss in less than 3 months) Percent weight loss: 24 % (in < 3 months) Interventions: Refer to RD note for recommendations   DVT prophylaxis:  SCDs Start: 11/28/24 1042  Code Status: Full code Family Communication: Updated patient's wife at bedside. Level of care: Progressive Status is: Inpatient Remains inpatient appropriate because: Intractable nausea and vomiting, hematemesis and gastric cancer   Final disposition: To be determined.   55 minutes with more than 50% spent in reviewing records, counseling patient/family and coordinating care.  Consultants:  Gastroenterology General Surgery Oncology Palliative medicine  Procedures: 12/16-EGD and gastric stenting  Microbiology summarized: None  Objective: Vitals:   12/12/24 0000 12/12/24 0417 12/12/24 1232 12/12/24 1252  BP: (!) 100/52 107/65 105/67 110/64  Pulse: 73 73 78 76  Resp: 16 18 18 19   Temp: 98.3 F (36.8 C) 97.6 F (36.4 C) 98 F (36.7 C) 98.2 F (36.8 C)  TempSrc: Oral Oral Oral Oral  SpO2: 93% 100% 94% 96%  Weight:      Height:        Examination:  GENERAL: No apparent distress.  Appears frail. HEENT: MMM.  Vision and hearing grossly intact.  NECK: Supple.  No apparent JVD.  RESP:  No IWOB.  Fair aeration bilaterally. CVS:  RRR. Heart sounds normal.  ABD/GI/GU: BS+. Abd soft, NTND.  MSK/EXT:  Moves extremities. No apparent deformity. No edema.  SKIN: no apparent skin lesion or wound NEURO: AA.  Oriented  appropriately.  No apparent focal neuro deficit. PSYCH: Calm. Normal affect.   Sch Meds:  Scheduled Meds:  Chlorhexidine  Gluconate Cloth  6 each Topical Daily   insulin  aspart  0-15 Units Subcutaneous Q4H   mouth rinse  15 mL Mouth Rinse 4 times per day   pantoprazole  (PROTONIX ) IV  40 mg Intravenous Q12H   Continuous Infusions:  ampicillin -sulbactam (UNASYN ) IV 3 g (12/12/24 1525)   octreotide  (SANDOSTATIN ) 500 mcg in sodium chloride  0.9 % 250 mL (2 mcg/mL) infusion 50 mcg/hr (12/12/24 0813)   promethazine  (PHENERGAN ) injection (IM or IVPB) 25 mg (12/11/24 1943)   TPN CYCLIC-ADULT (ION)     PRN Meds:.acetaminophen  **OR** acetaminophen , alum & mag hydroxide-simeth, labetalol , LORazepam , menthol , mouth rinse, phenol, promethazine  (PHENERGAN ) injection (IM or IVPB), sodium chloride  flush  Antimicrobials: Anti-infectives (From admission, onward)    Start     Dose/Rate Route Frequency Ordered Stop   12/11/24 0900  Ampicillin -Sulbactam (UNASYN ) 3 g in sodium chloride  0.9 % 100 mL IVPB        3 g 200 mL/hr over 30 Minutes Intravenous Every 6 hours 12/11/24 0817     11/28/24 1200  Ampicillin -Sulbactam (UNASYN ) 3 g in sodium chloride  0.9 % 100 mL IVPB  Status:  Discontinued        3 g 200 mL/hr over 30 Minutes Intravenous Every 6 hours 11/28/24 1048 12/05/24 0909   11/28/24 0045  Ampicillin -Sulbactam (UNASYN ) 3 g in sodium chloride  0.9 % 100 mL IVPB  3 g 200 mL/hr over 30 Minutes Intravenous  Once 11/28/24 0042 11/28/24 0134        I have personally reviewed the following labs and images: CBC: Recent Labs  Lab 12/07/24 0739 12/10/24 0908 12/11/24 0337 12/12/24 0752  WBC 3.8* 5.3 5.2 6.6  NEUTROABS 2.6 4.3 4.6  --   HGB 12.0* 10.4* 8.3* 6.7*  HCT 35.6* 31.3* 25.8* 20.7*  MCV 89.7 91.3 94.9 95.0  PLT 292 292 251 227   BMP &GFR Recent Labs  Lab 12/08/24 0412 12/09/24 0742 12/10/24 0342 12/11/24 0337 12/12/24 0446  NA 134* 140 138 141 140  K 5.0 4.6 4.4 3.8 4.0   CL 103 107 104 106 105  CO2 21* 21* 24 26 28   GLUCOSE 190* 113* 250* 270* 259*  BUN 24* 38* 37* 35* 33*  CREATININE 0.61 0.76 0.73 0.75 0.69  CALCIUM  8.6* 8.9 8.6* 8.0* 8.1*  MG 2.4 2.2 2.1 1.7 2.0  PHOS 2.3* 1.9* 2.2* 1.9* 3.1   Estimated Creatinine Clearance: 103.8 mL/min (by C-G formula based on SCr of 0.69 mg/dL). Liver & Pancreas: Recent Labs  Lab 12/08/24 0412 12/09/24 0742 12/10/24 0342 12/11/24 0337 12/12/24 0446  AST 26 31 24 17  14*  ALT 116* 99* 83* 44 32  ALKPHOS 171* 191* 154* 110 94  BILITOT 0.5 0.7 0.4 0.5 0.3  PROT 6.0* 6.5 5.8* 5.1* 5.0*  ALBUMIN  3.0* 3.2* 2.8* 2.5* 2.3*   No results for input(s): LIPASE, AMYLASE in the last 168 hours.  No results for input(s): AMMONIA in the last 168 hours. Diabetic: No results for input(s): HGBA1C in the last 72 hours.  Recent Labs  Lab 12/11/24 2053 12/11/24 2351 12/12/24 0418 12/12/24 0751 12/12/24 1218  GLUCAP 163* 165* 245* 198* 143*   Cardiac Enzymes: No results for input(s): CKTOTAL, CKMB, CKMBINDEX, TROPONINI in the last 168 hours. Recent Labs    12/11/24 1208  PROBNP 244.0   Coagulation Profile: Recent Labs  Lab 12/11/24 0337  INR 1.1    Thyroid  Function Tests: No results for input(s): TSH, T4TOTAL, FREET4, T3FREE, THYROIDAB in the last 72 hours. Lipid Profile: No results for input(s): CHOL, HDL, LDLCALC, TRIG, CHOLHDL, LDLDIRECT in the last 72 hours.  Anemia Panel: No results for input(s): VITAMINB12, FOLATE, FERRITIN, TIBC, IRON, RETICCTPCT in the last 72 hours. Urine analysis:    Component Value Date/Time   COLORURINE YELLOW 11/04/2024 1812   APPEARANCEUR CLEAR 11/04/2024 1812   LABSPEC 1.025 11/04/2024 1812   PHURINE 5.5 11/04/2024 1812   GLUCOSEU NEGATIVE 11/04/2024 1812   HGBUR NEGATIVE 11/04/2024 1812   BILIRUBINUR NEGATIVE 11/04/2024 1812   KETONESUR 15 (A) 11/04/2024 1812   PROTEINUR TRACE (A) 11/04/2024 1812   NITRITE  NEGATIVE 11/04/2024 1812   LEUKOCYTESUR NEGATIVE 11/04/2024 1812   Sepsis Labs: Invalid input(s): PROCALCITONIN, LACTICIDVEN  Microbiology: No results found for this or any previous visit (from the past 240 hours).  Radiology Studies: No results found.      Lindwood Mogel T. Parker Sawatzky [facility]  If 7PM-7AM, please contact night-coverage [email or web address] 12/12/2024, 4:01 PM   "

## 2024-12-12 NOTE — Progress Notes (Signed)
" ° °  Palliative Medicine Inpatient Follow Up Note HPI: 58 y.o. male with past medical history of gastric cancer (new chemotherapy 12/12), DVT with IVC filter admitted on 11/27/2024 with nausea and vomiting with coffee ground emesis. Has had a prolonged hospitalization with complications - SQ air on chest xrays. Palliative care is supporting additional goals of care conversations.   Today's Discussion 12/12/2024  *Please note that this is a verbal dictation therefore any spelling or grammatical errors are due to the Dragon [employer] One system interpretation.  I reviewed the chart notes including nursing notes from today, progress notes from today. I also reviewed vital signs, nursing flowsheets, medication administrations record, labs, and imaging.    Oral Intake %:  0% I/O:  Question accuracy (+) 4,251 Bowel Movements:  Last 12/27 Mobility: Limited   I met with Henry May this morning. He shares that his two sisters just got here from [place]. They are joining him in the room at the time of meeting.  He shares joy that they have traveled here to be with him. His daughter is also present.   We discussed Henry May's current state - he shares that he today feels overall fairly well. Denies nausea, shortness of breath, pain. He and I reviewed that per the GI doctor he may get another EGD tomorrow.  Created space and opportunity for patient to explore thoughts feelings and fears regarding current medical situation. He and I reviewed the importance of a family meeting. Plan to meet Tuesday pending additional results to discuss patients overall health and long term goals.   Questions and concerns addressed/Palliative Support Provided.   Objective Assessment: Vital Signs Vitals:   12/12/24 1232 12/12/24 1252  BP: 105/67 110/64  Pulse: 78 76  Resp: 18 19  Temp: 98 F (36.7 C) 98.2 F (36.8 C)  SpO2: 94% 96%    Intake/Output Summary (Last 24 hours) at 12/12/2024 1406 Last data filed at  12/11/2024 1900 Gross per 24 hour  Intake 1694.73 ml  Output --  Net 1694.73 ml   Last Weight  Most recent update: 12/11/2024  5:14 AM    Weight  74.7 kg (164 lb 10.9 oz)            Gen:  Middle aged Hispanic M chronically ill appearing HEENT: Dry mucous membranes CV: Regular rate and rhythm  PULM: On RA, breathing is even and nonlabored  EXT: No edema  Neuro: Alert and oriented x3   SUMMARY OF RECOMMENDATIONS   Full Code / Full scope of care  Plan for meeting on Tuesday at noon with in person interpretor  CT scan pending to evaluate for retroperitoneal perforation  Ongoing PMT support ______________________________________________________________________________________ Rosaline Becton [place] Palliative Medicine Team Team Cell Phone: [phone] Please utilize secure chat with additional questions, if there is no response within 30 minutes please call the above phone number  Time Spent: 31  Palliative Medicine Team providers are available by phone from 7am to 7pm daily and can be reached through the team cell phone.  Should this patient require assistance outside of these hours, please call the patient's attending physician.     "

## 2024-12-13 ENCOUNTER — Other Ambulatory Visit: Payer: Self-pay | Admitting: Oncology

## 2024-12-13 DIAGNOSIS — J690 Pneumonitis due to inhalation of food and vomit: Secondary | ICD-10-CM | POA: Diagnosis not present

## 2024-12-13 DIAGNOSIS — R7401 Elevation of levels of liver transaminase levels: Secondary | ICD-10-CM

## 2024-12-13 DIAGNOSIS — K920 Hematemesis: Secondary | ICD-10-CM | POA: Diagnosis not present

## 2024-12-13 DIAGNOSIS — Z515 Encounter for palliative care: Secondary | ICD-10-CM | POA: Diagnosis not present

## 2024-12-13 DIAGNOSIS — C169 Malignant neoplasm of stomach, unspecified: Secondary | ICD-10-CM | POA: Diagnosis not present

## 2024-12-13 DIAGNOSIS — R509 Fever, unspecified: Secondary | ICD-10-CM | POA: Diagnosis not present

## 2024-12-13 DIAGNOSIS — D509 Iron deficiency anemia, unspecified: Secondary | ICD-10-CM

## 2024-12-13 DIAGNOSIS — Z711 Person with feared health complaint in whom no diagnosis is made: Secondary | ICD-10-CM | POA: Diagnosis not present

## 2024-12-13 DIAGNOSIS — Z7189 Other specified counseling: Secondary | ICD-10-CM | POA: Diagnosis not present

## 2024-12-13 DIAGNOSIS — T797XXA Traumatic subcutaneous emphysema, initial encounter: Secondary | ICD-10-CM | POA: Diagnosis not present

## 2024-12-13 DIAGNOSIS — C163 Malignant neoplasm of pyloric antrum: Secondary | ICD-10-CM | POA: Diagnosis not present

## 2024-12-13 LAB — TYPE AND SCREEN
ABO/RH(D): O POS
Antibody Screen: NEGATIVE
Unit division: 0
Unit division: 0

## 2024-12-13 LAB — MAGNESIUM: Magnesium: 2 mg/dL (ref 1.7–2.4)

## 2024-12-13 LAB — BPAM RBC
Blood Product Expiration Date: 202601242359
Blood Product Expiration Date: 202601242359
ISSUE DATE / TIME: 202512281231
ISSUE DATE / TIME: 202512281703
Unit Type and Rh: 5100
Unit Type and Rh: 5100

## 2024-12-13 LAB — CBC
HCT: 25.5 % — ABNORMAL LOW (ref 39.0–52.0)
Hemoglobin: 8.3 g/dL — ABNORMAL LOW (ref 13.0–17.0)
MCH: 30.2 pg (ref 26.0–34.0)
MCHC: 32.5 g/dL (ref 30.0–36.0)
MCV: 92.7 fL (ref 80.0–100.0)
Platelets: 196 K/uL (ref 150–400)
RBC: 2.75 MIL/uL — ABNORMAL LOW (ref 4.22–5.81)
RDW: 15.4 % (ref 11.5–15.5)
WBC: 4.7 K/uL (ref 4.0–10.5)
nRBC: 0.4 % — ABNORMAL HIGH (ref 0.0–0.2)

## 2024-12-13 LAB — GLUCOSE, CAPILLARY
Glucose-Capillary: 130 mg/dL — ABNORMAL HIGH (ref 70–99)
Glucose-Capillary: 139 mg/dL — ABNORMAL HIGH (ref 70–99)
Glucose-Capillary: 146 mg/dL — ABNORMAL HIGH (ref 70–99)
Glucose-Capillary: 147 mg/dL — ABNORMAL HIGH (ref 70–99)
Glucose-Capillary: 166 mg/dL — ABNORMAL HIGH (ref 70–99)
Glucose-Capillary: 80 mg/dL (ref 70–99)

## 2024-12-13 LAB — COMPREHENSIVE METABOLIC PANEL WITH GFR
ALT: 51 U/L — ABNORMAL HIGH (ref 0–44)
AST: 20 U/L (ref 15–41)
Albumin: 2.4 g/dL — ABNORMAL LOW (ref 3.5–5.0)
Alkaline Phosphatase: 103 U/L (ref 38–126)
Anion gap: 6 (ref 5–15)
BUN: 29 mg/dL — ABNORMAL HIGH (ref 6–20)
CO2: 29 mmol/L (ref 22–32)
Calcium: 8 mg/dL — ABNORMAL LOW (ref 8.9–10.3)
Chloride: 109 mmol/L (ref 98–111)
Creatinine, Ser: 0.6 mg/dL — ABNORMAL LOW (ref 0.61–1.24)
GFR, Estimated: >60 mL/min
Glucose, Bld: 154 mg/dL — ABNORMAL HIGH (ref 70–99)
Potassium: 4 mmol/L (ref 3.5–5.1)
Sodium: 144 mmol/L (ref 135–145)
Total Bilirubin: 0.3 mg/dL (ref 0.0–1.2)
Total Protein: 5.1 g/dL — ABNORMAL LOW (ref 6.5–8.1)

## 2024-12-13 LAB — DIFFERENTIAL
Abs Immature Granulocytes: 0.11 K/uL — ABNORMAL HIGH (ref 0.00–0.07)
Basophils Absolute: 0 K/uL (ref 0.0–0.1)
Basophils Relative: 0 %
Eosinophils Absolute: 0.1 K/uL (ref 0.0–0.5)
Eosinophils Relative: 2 %
Immature Granulocytes: 2 %
Lymphocytes Relative: 14 %
Lymphs Abs: 0.6 K/uL — ABNORMAL LOW (ref 0.7–4.0)
Monocytes Absolute: 0.3 K/uL (ref 0.1–1.0)
Monocytes Relative: 6 %
Neutro Abs: 3.5 K/uL (ref 1.7–7.7)
Neutrophils Relative %: 76 %

## 2024-12-13 LAB — PHOSPHORUS: Phosphorus: 3.7 mg/dL (ref 2.5–4.6)

## 2024-12-13 LAB — TRIGLYCERIDES: Triglycerides: 132 mg/dL

## 2024-12-13 MED ORDER — TRAVASOL 10 % IV SOLN
INTRAVENOUS | Status: AC
  Filled 2024-12-13: qty 1080

## 2024-12-13 MED ORDER — GUAIFENESIN-DM 100-10 MG/5ML PO SYRP
5.0000 mL | ORAL_SOLUTION | ORAL | Status: AC | PRN
  Administered 2024-12-13 – 2024-12-31 (×10): 5 mL via ORAL
  Filled 2024-12-13 (×11): qty 10

## 2024-12-13 NOTE — Progress Notes (Signed)
 IP PROGRESS NOTE  Subjective:   Henry May reports improvement in nausea.  He had 2 bowel movements on Saturday.  He continues to have intermittent hematemesis.  He was transfused 2 units of packed red blood cells yesterday.  No pain.  He has a cough.  He was seen by gastroenterology yesterday.  A CT evaluation was ordered.    Objective: Vital signs in last 24 hours: Blood pressure 132/74, pulse 72, temperature 98.1 F (36.7 C), temperature source Oral, resp. rate 16, height 6' (1.829 m), weight 164 lb 10.9 oz (74.7 kg), SpO2 96%.  Intake/Output from previous day: 12/28 0701 - 12/29 0700 In: 5401.5 [I.V.:4169.5; Blood:682; IV Piggyback:550] Out: -   Physical Exam:  HEENT: No thrush, dried blood over the lips Lungs: Clear bilaterally, no respiratory distress Cardiac: Regular rate and rhythm Abdomen: Soft, nontender, no hepatomegaly Extremities: No leg edema   Portacath/PICC-without erythema  Lab Results: Recent Labs    12/12/24 0752 12/13/24 0304  WBC 6.6 4.7  HGB 6.7* 8.3*  HCT 20.7* 25.5*  PLT 227 196      BMET Recent Labs    12/12/24 0446 12/13/24 0304  NA 140 144  K 4.0 4.0  CL 105 109  CO2 28 29  GLUCOSE 259* 154*  BUN 33* 29*  CREATININE 0.69 0.60*  CALCIUM  8.1* 8.0*    Lab Results  Component Value Date   CEA 3.57 01/15/2024      Medications: I have reviewed the patient's current medications.  Assessment/Plan: Gastric cancer 11/21/2023 EGD-gastric body with infiltrative looking lesion; biopsy shows involvement of a hypercellular lesion that suggests diffuse carcinoma by morphology CTs abdomen/pelvis 11/28/2023-diffuse fatty infiltration of the liver; gastric wall thickening; lymph nodes up to 6 mm in diameter near the stomach wall. 12/31/2023 upper endoscopy-large diffuse friable infiltrative polypoid and ulcerated circumferential mass found in the gastric body.  Scope was passed beyond the mass with normal antrum/pylorus.  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  Negative for HER2 (1+); mismatch repair protein IHC normal; PD-L1 CPS 0%; CLDN18 positive: 90% of tumor cells with 2+/3+ membrane staining PET scan 01/07/2024-circumferential hypermetabolic gastric mucosal thickening.  Ill-defined hypermetabolic lymph nodes in the gastrohepatic ligament.  Intense hypermetabolic activity associated with the right lobe of the thyroid  gland.  Small hypermetabolic right axillary node favored reactive. 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  Gastric wall thickening slightly improved. Cycle 7 oxaliplatin , zolbetuximab 05/05/2024, Fulphila  Cycle 8 zolbetuximab 05/20/2024, oxaliplatin  held due to neuropathy, no Fulphila  Cycle 9 oxaliplatin /zolbetuximab 06/03/2024, Fulphila  Cycle 10 oxaliplatin /zolbetuximab 06/17/2024, Fulphila  Cycle 11 oxaliplatin /zolbetuximab 06/30/2024, Fulphila , oxaliplatin  dose reduced Cycle 12 zolbetuximab 07/15/2024, oxaliplatin  held secondary to neuropathy Cycle 13 zolbetuximab 07/29/2024, oxaliplatin  held secondary to neuropathy Cycle 14 zolbetuximab 08/26/2024, oxaliplatin  held secondary to neuropathy 09/01/2024 CTs-mild distal gastric wall thickening without discrete mass.  No metastatic disease. Cycle 15 zolbetuximab 09/09/2024, oxaliplatin  held due to  neuropathy Cycle 16 zolbetuximab 09/30/2024, oxaliplatin  held due to neuropathy Cycle 17 zolbetuximab 10/21/2024, oxaliplatin  held due to neuropathy 11/03/2024 CT abdomen/pelvis: Diffuse mild wall thickening of  the distal gastric body and antrum-stable, mild increase in a right gastric lymph node small volume ascites without omental nodularity, proximal jejunal mural thickening 11/04/2024 upper GI: Narrowing of the gastric antrum, normal gastric emptying, dilated duodenum and proximal jejunum without evidence of a bowel obstruction 11/18/2024 upper endoscopy: Tumor in the gastric body and antrum with antral narrowing, dilated duodenum without evidence of mass or obstruction, esophagitis: Biopsy of the gastric mass-poorly differentiated adenocarcinoma with focal signet ring morphology, esophagus biopsy: Squamous mucosa with focal ulceration Cycle 1 weekly cisplatin  11/26/2024 Cycle 2 weekly cisplatin  12/07/2024 12/12/2024 CTs: Pneumomediastinum extending into the neck, lower lobe consolidation, esophagus distention, air in the bladder, new mildly enlarged right hilar node, new small volume ascites Early satiety, postprandial abdominal pain, weight loss secondary to #1 History of bilateral lower extremity DVT 2021-anticoagulation discontinued due to massive retroperitoneal bleed, IVC filter placed 08/24/2020 Hospitalized with acute respiratory failure due to COVID-19 08/06/2020 - 09/04/2020 History of massive retroperitoneal bleed secondary to anticoagulation September 2021 Thyroid  ultrasound 01/13/2024-no abnormal nodule identified in the right lobe.  1.1 cm thyroid  isthmus nodule meets criteria for 1 year follow-up ultrasound. Admission 02/01/2024 with increased abdominal pain/ascites Hospitalization with altered mental status 02/05/2024 through 02/13/2024; question rare case of 5-FU psychosis.  Improved 02/16/2024.  Mental status at baseline 02/23/2024. Neutropenia 02/16/2024 Mucositis 02/16/2024.  Resolved  02/23/2024 Right knee pain/edema/erythema 03/01/2024-Doppler study negative for DVT History of gout Oxaliplatin  neuropathy-prolonged cold sensitivity, diminished vibratory sense following cycle 5 chemotherapy.  Persistent cold sensitivity 05/20/2024, oxaliplatin  held.  Cold sensitivity resolved 06/03/2024, oxaliplatin  resumed.  Mild loss of vibratory sense, oxaliplatin  dose reduced 06/29/2024 Admission 11/28/2024 with intractable nausea and vomiting 11/27/2024 CT Abdo/pelvis: Bilateral lower lobe aspiration/pneumonia, thick-walled gastric body/antrum, dilated proximal small bowel, small volume pelvic ascites 11/30/2024 EGD: Malignant appearing severe stenosis at the gastric antrum-stent placed, dilated duodenum with retained fluid 15.  Anemia secondary to GI bleeding 12/04/2024 2 units RBCs   Mr Loewen has metastatic gastric cancer.  There is evidence of disease progression with persistent/progressive tumor in the stomach and intractable nausea/vomiting.  The omental disease on presentation last year remains significantly improved, by CT criteria.  He responded well to initial treatment with oxaliplatin  based chemotherapy.  He has neuropathy symptoms.  He began a trial of salvage therapy with cisplatin  on 11/26/2024.  He completed cycle 2 on 12/07/2024.  He is now admitted with intractable nausea/vomiting, potentially related to a small bowel obstruction, ileus from the carcinomatosis, in addition to persistent tumor in the stomach.   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  There was retained fluid in the stomach and duodenum.  A gastric antral stent was placed.  He continues to have nausea and vomiting of liquids despite placement of the gastric antral stent.  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  He developed recurrent severe nausea and vomiting 12/08/2024.  It is possible the cisplatin  given 12/07/2024  contributed.  He reports significant improvement in nausea with resumption of octreotide .  He now has hematemesis, likely secondary to the known gastric mass.  He had severe anemia yesterday.  The hemoglobin is higher after red cell transfusions.  Mr Roark has developed CT evidence of progressive aspiration pneumonia.  He also has pneumomediastinum with subcutaneous emphysema extending to the neck.  The significance of the emphysematous findings is unclear.  I reviewed the CT images.  Mr Littlejohn has been evaluated by the palliative care service.  He  indicates he would like to continue treatment of the cancer.  The prognosis is poor.  Treatment options are limited.  We can consider salvage therapy with paclitaxel/ramucirumab, but ramucirumab can be associated with bleeding risk.  We can also consider palliative radiation to the stomach.  Recommendations: Continue TPN, IV fluids when off of TPN Continue octreotide , Phenergan , and Ativan  Evaluation of the pneumomediastinum/subcutaneous emphysema per gastroenterology Continue goals of care discussion Plan 4-week #3 paclitaxel 12/14/2020 or 12/15/2020 depending on the GI evaluation   LOS: 15 days   Arley Hof, MD   12/13/2024, 7:04 AM

## 2024-12-13 NOTE — Progress Notes (Addendum)
 "    [facility] Progress Note  CC: Gastric cancer, hematemesis  Subjective: He endorsed vomiting a small amount of red blood x 2 episodes yesterday and x 1 today at 11:30 AM.  No abdominal pain.  He remains NPO.  No chest pain or shortness of breath.  Friend at the bedside.   Objective:  Vital signs in last 24 hours: Temp:  [97.5 F (36.4 C)-98.3 F (36.8 C)] 98 F (36.7 C) (12/29 1204) Pulse Rate:  [70-81] 79 (12/29 1204) Resp:  [15-20] 18 (12/29 1204) BP: (115-137)/(70-83) 125/78 (12/29 1204) SpO2:  [94 %-99 %] 94 % (12/29 1204) Last BM Date : 12/11/24 General: Chronically ill and fatigued appearing 58 year old male. Heart: Regular rate and rhythm, no murmurs. Pulm: Few scattered wheezes throughout the right lung field, otherwise clear throughout.  On oxygen  2 L nasal cannula. Abdomen: Soft, nondistended.  Nontender.  Positive bowel sounds to all 4 quadrants.  No palpable mass. Extremities: No lower extremity edema. Neurologic:  Alert and oriented x 4.  Speech is clear.  Moves all extremities equally. Psych:  Alert and cooperative. Normal mood and affect.  Intake/Output from previous day: 12/28 0701 - 12/29 0700 In: 5401.5 [I.V.:4169.5; Blood:682; IV Piggyback:550] Out: -  Intake/Output this shift: No intake/output data recorded.  Lab Results: Recent Labs    12/11/24 0337 12/12/24 0752 12/13/24 0304  WBC 5.2 6.6 4.7  HGB 8.3* 6.7* 8.3*  HCT 25.8* 20.7* 25.5*  PLT 251 227 196   BMET Recent Labs    12/11/24 0337 12/12/24 0446 12/13/24 0304  NA 141 140 144  K 3.8 4.0 4.0  CL 106 105 109  CO2 26 28 29   GLUCOSE 270* 259* 154*  BUN 35* 33* 29*  CREATININE 0.75 0.69 0.60*  CALCIUM  8.0* 8.1* 8.0*   LFT Recent Labs    12/13/24 0304  PROT 5.1*  ALBUMIN  2.4*  AST 20  ALT 51*  ALKPHOS 103  BILITOT 0.3   PT/INR Recent Labs    12/11/24 0337  LABPROT 15.3*  INR 1.1   Hepatitis Panel No results for input(s): HEPBSAG, HCVAB,  HEPAIGM, HEPBIGM in the last 72 hours.  CT CHEST ABDOMEN PELVIS W CONTRAST Result Date: 12/13/2024 EXAM: CT CHEST, ABDOMEN AND PELVIS WITH CONTRAST 12/12/2024 02:52:35 PM TECHNIQUE: CT of the chest, abdomen and pelvis was performed with the administration of 100 mL of iohexol  (OMNIPAQUE ) 300 MG/ML solution. Multiplanar reformatted images are provided for review. Automated exposure control, iterative reconstruction, and/or weight based adjustment of the mA/kV was utilized to reduce the radiation dose to as low as reasonably achievable. COMPARISON: Portable chest from 12/11/2024, portable abdomen 12/10/2024, portable chest 12/09/2024, most recent abdomen and pelvis CT with contrast 11/27/2024, 11/02/2024, and 09/01/2024; and PET CT 01/07/2024. CLINICAL HISTORY: Gastric cancer, monitor; Patient with fever, subcutaneous emphysema and carcinomatosis, rule out retroperitoneal perforation. History of EGD 11/30/2024 with insertion of a gastroduodenal stent. FINDINGS: CHEST: MEDIASTINUM AND LYMPH NODES: Right IJ port catheter terminating at the superior cavoatrial junction. Heart and pericardium are unremarkable. The central airways are clear. There are a few slightly prominent bilateral hilar lymph nodes up to 1 cm in short axis, probably reactive, new from the PET CT. There is new enlargement of a right hilar lymph node just inferior to the right main bronchus measuring 1.2 x 1.8 cm. No mediastinal adenopathy is seen. Heterogeneous 1.6 cm nodule of the posterior right lobe of the thyroid  was evaluated with thyroid  ultrasound 01/13/2024. This is unchanged in size by CT,  but could not be located on ultrasound. Consider repeat ultrasound if clinically warranted. Once again, the esophagus is distended with refluxed or retained fluid nearly to the thoracic inlet. There is retained mucoid debris in the trachea to the right. The main bronchi are patent. There is a significant aspiration risk due to the esophageal fluid  distention. Aspiration precautions are recommended unless already being done. LUNGS AND PLEURA: There is scarring in the lungs without a specific zonal predilection, most likely post-COVID type scarring, with patchy areas of predominantly subpleural coarse interstitial change and ground glass and areas of architectural distortion. There is patchy consolidation in the lower lobes, and scattered opacities in the posterior left upper lobe consistent with pneumonia or aspiration. This finding has increased especially in the left lower lobe since 11/27/2024. No other focal infiltrate is seen. No pleural effusion or pneumothorax. ABDOMEN AND PELVIS: LIVER: The liver is mildly steatotic without evidence of metastasis. GALLBLADDER AND BILE DUCTS: Gallbladder is unremarkable. No biliary ductal dilatation. SPLEEN: No acute abnormality. PANCREAS: No acute abnormality. ADRENAL GLANDS: No adrenal mass. KIDNEYS, URETERS AND BLADDER: There are stones measuring 6 mm and 4 mm in the right kidney collecting system. No left nephrolithiasis or hydronephrosis. No perinephric or periureteral stranding. There is air in the anterior bladder, which could be due to catheterization, but there is air in the bladder base in the wall consistent with emphysematous cystitis. This was not seen previously. Urinalysis and appropriate treatment is recommended. GI AND BOWEL: Circumferential gastric thickening continues to be seen, with adjacent stranding change. Antropyloric stenting has been performed. There is continued dilatation in the duodenum, maximum 6 cm caliber without an appreciable transition. There are thickened jejunal folds consistent with nonspecific enteritis. The small bowel is otherwise normal caliber. The appendix is normal. No evidence of focal colitis or diverticulitis. There is a small volume of ascites. REPRODUCTIVE ORGANS: No acute abnormality. PERITONEUM AND RETROPERITONEUM: No free air. VASCULATURE: Aorta is normal in caliber.  There is an IVC filter. No other significant vascular findings. ABDOMINAL AND PELVIS LYMPH NODES: No lymphadenopathy. BONES AND SOFT TISSUES: There is multilevel bridging enthesopathy of the thoracic spine. No thoracic bone metastasis is seen. There are mild degenerative changes of the lumbar spine. Since 11/27/2024, there is patchy substernal pneumomediastinum, source indeterminate. This extends cephalad to the supraclavicular fossae and perivascular regions of the neck. IMPRESSION: 1. New patchy substernal pneumomediastinum of indeterminate source, extending into the neck. 2. Patchy consolidation in the lower lobes and scattered opacities in the posterior left upper lobe consistent with pneumonia or aspiration, increased since 11/27/2024. 3. Esophageal distention with retained/refluxed fluid to the thoracic inlet, with significant aspiration risk; aspiration precautions are recommended. 4. 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 5. Circumferential gastric thickening with adjacent stranding change and antropyloric stenting, with continued duodenal dilatation (maximum 6 cm) without transition and thickened jejunal folds consistent with nonspecific enteritis. 6. New mildly enlarged right hilar lymph node and a few slightly prominent bilateral hilar lymph nodes; no evidence of metastatic disease. 7. Right-sided nephrolithiasis. 8. Small volume of ascites, new. 9. These results will be telephoned to the referring provider or the referring providers representative by professional radiology assistant [facility]) personnel, with communication documented in the [facility] dashboard. 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 Admitted 11/27/2024 with intractable nausea/vomiting/coffee-ground emesis with stable hemoglobin (Hb 14.5 - baseline) and unable to keep anything down.   Assessment / Plan:  58 year old male with stage IV gastric adenocarcinoma admitted with intractable N/V with gastric outlet/antral obstruction. CTAP 12/13 showed thick walled gastric body and antrum without a definitive mass with nodularity along the pylorus and proximal dilated small bowel loops, ? mild obstruction without a discrete transition point. S/P EGD with gastric stent placement 12/16. On TPN. Patient developed hematemesis 12/25 +/- hemoptysis. Octreotide  restarted. Repeat CTAP 12/28 showed circumferential gastric thickening with antropyloric stent with continued dilatation in the duodenum 6 cm caliber without an appreciable transition, thickened jejunal folds consistent with nonspecific enteritis and small volume of ascites.  Palliative care consulted, patient wishes to continue treatment for cancer.  Patient had 1 episode of hematemesis today at 11:30 AM.  Afebrile.  Hemodynamically stable. - NPO - Continue TPN - IV fluids per the hospitalist  - Continue PPI IV bid - ? Carafate  slurry  - Continue Promethazine  25 mg IV every 6 hours as needed - Continue Octreotide  infusion at 50 mcg/hr - Transfuse for Hg level < 7 or as needed if symptomatic  - Oncology following along - Await further recommendations per Dr. San  Normocytic anemia. Secondary to gastric adenocarcinoma and hematemesis. Hg 12 -> 10.4 -> 8.3 -> 6.7 on 12/28 -> transfused 2 units of PRBCs -> today Hg 8.3.  - Transfuse for Hg level < 7 or as needed if symptomatic    Possible aspiration pneumonitis/pneumonia. CXR showed patchy airspace opacities in RML. CTAP showed bibasilar infiltrate. Treated with antibiotic x 7 days. Subcutaneous air seen on chest x-rays 12/25. Chest CT 12/28 identified a few new prominent bilateral hilar  lymph nodes, new enlargement of the right hilar lymph node inferior to the right main bronchus measuring 1.2 x 1.8 cm (likely reactive per Dr. Cloretta) and distended esophagus with reflux or retained fluid nearly to the thoracic inlet with retained mucoid debris in the trachea. Patient at high risk for aspiration secondary to esophageal fluid distention.  -NPO  History of a DVT s/p IVC filter, not on AC   Elevated LFTs, improving. T. Bili 0.3. Alk phos 103. AST 20. ALT 51. CTAP 11/27/2024 showed a normal liver and gallbladder without biliary ductal dilatation.  Repeat CTAP 12/28 showed mild hepatic steatosis.     Principal Problem:   Hematemesis with nausea Active Problems:   History of DVT (deep vein thrombosis)   Diabetes mellitus without complication (HCC)   Gastric cancer (HCC)   Intractable nausea and vomiting   Partial small bowel obstruction (HCC)   Protein-calorie malnutrition, severe   Gastric outlet obstruction   Decreased hemoglobin   Subcutaneous emphysema   Fever, unspecified     LOS: 15 days   Elida HERO Kennedy-Smith  12/13/2024, 2:03 PM   Attending physician's note   I have reviewed the chart, examined the patient, and discussed his care on rounds today. I performed a substantive portion of this encounter, including complete performance of at least one of the key components, in conjunction with the APP. I agree with the APP's note, impression, and recommendations with my edits.   Good response to RBC transfusion with hemoglobin 8.3 today.  CT C/A/P with new patchy substernal pneumomediastinum extending into the neck, patchy consolidation in the lower lobes and posterior LUL consistent with pneumonia or aspiration, esophageal distention with retained fluid posing aspiration  risk, emphysematous cystitis, circumferential gastric thickening with adjacent stranding and continued duodenal dilatation and thickened jejunal folds with antropyloric stent in place.    Difficult  clinical situation.  Hematemesis can certainly be from malignancy which would not be readily amenable to endoscopic intervention.  Recent EGD also with LA Grade D erosive esophagitis.  He remains in aspiration risk from poor gastric clearance despite stent in place.  Possible that he has secondary downstream obstruction, and perhaps a repeat UGI series would help identify.  The presence of pneumomediastinum in a patient with gastric cancer and carcinomatosis is concerning for malignant perforation.  However, he otherwise has not changed his hemodynamics and no clinical features of perforation and no obvious free air.  Possibly microperforation with retroperitoneal tracking?  Possibly a small self-limiting microperforation from retching (Mallory-Weiss tear in the setting of severe erosive esophagitis)?  Additionally, could have malignant necrosis with retroperitoneal tracking.  The emphysematous cystitis in a patient with carcinomatosis is also concerning.  Recommend UA and urine culture to evaluate for gas-forming infection with retroperitoneal tracking.  I had a long conversation with the patient, friend at bedside, and daughter by phone today. We discussed the CT findings at length.  His abdominal exam is completely benign.  No crepitus.  Hemodynamically stable and afebrile.  He otherwise has no outward evidence of active perforation.  Discussed options and all in agreement for continued conservative monitoring.  - Continue TPN - Continue PPI - Palliative care service consulted - GI service will continue to follow   Zyaira Vejar, DO, FACG [phone] office   A total of 50 minutes of time was spent on this encounter, including in depth chart review, independent review of results as outlined above, communicating results with the patient directly, face-to-face time with the patient, coordinating care, and ordering studies and medications as appropriate, and documentation.          "

## 2024-12-13 NOTE — Progress Notes (Signed)
 PHARMACY - TOTAL PARENTERAL NUTRITION CONSULT NOTE   Indication: intolerance to enteral feeds  Patient Measurements: Height: 6' (182.9 cm) Weight: 74.7 kg (164 lb 10.9 oz) IBW/kg (Calculated) : 77.6 TPN AdjBW (KG): 70 Body mass index is 22.34 kg/m. Usual Weight:   Assessment: 58 yoM admitted on 12/13 with intractable nausea and vomiting, dilated small bowel loops, and coffee-ground emesis. 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  He underwent EGD 12/16 showing esophagitis,gastric stensosis,  stent placed, impaired peristalsis.  Trial NG tube clamping and CLD. Pharmacy is consulted to dose TPN on 12/20 for intolerance to enteral feeding.    Glucose / Insulin : No hx DM, no PTA meds. - Dexamethasone  2mg  IV q12h- dc'd 12/24 am  - Dexamethasone  10 mg IV given 12/23 as premed for chemo - Octreotide  infusion resumed 12/25 @50  mcg/hr (noted that Octreotide , especially at higher doses/frequencies, can cause glucose dysregulation). Discussed with MD about transitioning back to Octreotide  50mcg q8h, however, patient with hematemesis so continuing with Octreotide  infusion for now.  - On sSSI, has used 16 units/24 hours -  CBGs  (goal <150): 71 - 259  (only one value >200, most are around goal range) Electrolytes: All lytes WNL including CoCa; Na and phos are wnl but trending up Renal: SCr < 1 (stable), BUN 29 (elevated but slightly improved) Hepatic: Albumin  2.3 (low), ALT slightly elevated at 51 - Trig 164 (12/23), 132 (12/29) - albumin  low at 2.4 Intake / Output - MIVF: none - I/O: +5401 mL/24 hrs - Emesis: x2 occurrence - Urine output: x2 occurrence  - LBM: 12/27 GI Imaging: - 12/18 Abd xray: No bowel dilatation or evidence of obstruction. Retained gastric fluid. 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 Prosthesis placed. Duodenal dilation deformity  - 12/25 DG abd: nonobstructive bowel gas pattern - 12/27 DG abd: nonobstructive bowel gas  pattern GI Surgeries / Procedures:  - 12/16 EGD: esophagitis  GI meds:   - Protonix  40mg  IV  q12h >> - Octreotide  50 sq q8hrs >> DC'd 12/24; resumed 12/25 @50mcg /hr  - Decadron  2 gm IV q12hrs >> DC'd 12/24  Central access: Implanted Port (01/22/24) TPN start date: 12/20   Nutritional Goals: Goal TPN rate is 90 mL/hr and provides 108 g of protein and 2182 kcals per day)  RD Assessment: Estimated Needs Total Energy Estimated Needs: 2100-2450 kcals Total Protein Estimated Needs: 105-120 grams Total Fluid Estimated Needs: >/= 2.1L  Current Nutrition:  Diet: NPO, Cyclic TPN  Plan:  Now - TPN pending insurance approval for outpatient use - GOC discussions ongoing with family  At 1800: - Continue TPN to cycle over 18 hrs (glucose infusion rate 2.14 - 4.25 mg/kg/min) - Electrolytes in TPN:  Reduce Na to 50 mEq/L K 50 mEq/L Ca 5 mEq/L Mg 5 mEq/L Decrease to Phos 20 mmol/L Change Cl:Ac  to 1:1 - Add standard MVI and trace elements to TPN - Continue moderate intensity SSI q4h given lack of CBG control overnight. Reducing the TPN infusion duration to 18hrs did slightly improve CBGs given the decrease in glucose infusion rate. Can likely transition back to 12hr cyclic TPN once the octreotide  infusion is discontinued. If patient continues to be hospitalized and on Octreotide  infusion, may need to consider reverting back to continuous TPN given continued lack of CBG control.  - Monitor TPN labs on Mon/Thurs and PRN

## 2024-12-13 NOTE — Progress Notes (Signed)
 " PROGRESS NOTE  Henry May FMW:[id number] DOB: 09/17/1966   PCP: [name], DO  Patient is from: Home.  Lives with wife and daughter.  DOA: 11/27/2024 LOS: 15  Chief complaints No chief complaint on file.    Brief Narrative / Interim history: 58 year old M with PMH of DVT with IVC filter, gastric cancer on chemo and esophagitis presented to [facility] due to nausea, vomiting and coffee-ground emesis 2 days after he started new chemotherapy, and admitted for intractable nausea and vomiting, dilated small bowel loops and coffee-ground emesis.  Recent EGD on 12/4 with LA grade D esophagitis without bleeding, and large infiltrative, sessile and ulcerated circumferential mass with contact of bleeding in gastric body and antrum with antral narrowing and luminal diameter of approximately 1.5 cm  In ED, stable vitals.  CBC and CMP without significant finding.  Lipase 171.  CT abdomen and pelvis showed dilated proximal small bowel suggesting SBO but without discrete transition point, thick walled gastric body/antrum without definite focal mass, mild nodularity along the pylorus, scarring with superimposed patchy peribronchovascular nodularity in the bilateral lower lobes right more than left suggesting mild aspiration or pneumonia.  Patient was started on antiemetics, IV fluid, PPI and admitted for further care.  Oncology, surgery, GI and palliative medicine consulted.  Patient underwent EGD and gastric stent placement on 12/16.  Was unable to tolerate diet.  Started on TPN.  Plan was for discharge with TPN once logistic in place but nausea and vomiting gotten worse after stopping octreotide  and Decadron .  Also having hemoptysis with drop in hemoglobin.  Restarted on octreotide  infusion.    GI reengaged and order CT chest, abdomen and pelvis that showed new patchy substernal pneumomediastinum of indeterminate source, patchy consolidation in lower lobes and posterior LUL, esophageal distention  with retained/reflux fluid in thoracic inlet, emphysematous cystitis and circumferential gastric thickening with adjacent stranding change, antropyloric stenting and continued duodenal dilation, thickening jejunal folds.     Subjective: Seen and examined earlier this morning.  No major events overnight or this morning.  Patient's wife at bedside.  Overall, he feels better.  Only 1 episode of small emesis last night.  Still with some blood in it.  Hemoglobin improved to 8.3 after 2 units.  Assessment and plan: Gastric cancer, gastric luminal narrowing and esophagitis: Intractable nausea and vomiting: Likely due to the above. Acute blood loss anemia due to hematemesis-likely due to bleeding from gastric cancer. -Initial CT with dilated small bowel without transition point.  -S/p EGD and gastric stenting in 12/16. -Repeat CTA chest, abdomen and pelvis as above. -Resumed IV Unasyn  on 12/28. -Remains n.p.o. except ice chips and sips with meds. -Continue TPN per pharmacy.  -Continue IV Phenergan , Zofran  and Ativan . -Resumed octreotide  infusion on 12/25.  Will continue. -Continue IV Protonix  40 mg twice daily -Follow further recommendation by GI -Aspiration precaution -Continue monitoring CBC. -Oncology and palliative following.  Gastric cancer: Recently restarted on chemo.  Followed by Dr. Zorita.  -S/p EGD and gastric stenting on 12/16. -Received cisplatin  chemo on 12/23. -Repeat CT chest, abdomen and pelvis as above. -Oncology and palliative medicine on board. -Supportive care as above.  Aspiration pneumonitis/pneumonia: Initial CXR showed patchy airspace opacities in RML.  Initial CT CT abdomen and pelvis with bibasilar infiltrate. Completed 7 days of appropriate antibiotics.  Spiked fever on 12/27. Repeat chest x-ray with stable right lung opacities/infiltrate.  Repeat CT chest, abdomen and pelvis as above. -IV Unasyn  resumed on 12/27. -Aspiration precaution -Wean oxygen  as  able  Pneumomediastinum: Noted on CT chest.  Unclear source.  Could be from retching and vomiting. -Monitor   Diet controlled diabetes with hyperglycemia: A1c 5.7%.  Not on medication at home.  Hyperglycemia likely due to octreotide  -TPN per pharmacy.  Can add insulin  to TPN if needed -Continue SSI sensitive.   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  Improved.  Insomnia mainly due to intractable nausea and vomiting - IV Ativan  and Phenergan  as above.  Lightheadedness/presyncope: Likely due to intractable nausea, vomiting and hematemesis. - IV fluid as above  Severe malnutrition Body mass index is 22.34 kg/m. Nutrition Problem: Severe Malnutrition Etiology: chronic illness (gastric cancer) Signs/Symptoms: severe fat depletion, severe muscle depletion, percent weight loss (24% weight loss in less than 3 months) Percent weight loss: 24 % (in < 3 months) Interventions: Refer to RD note for recommendations   DVT prophylaxis:  SCDs Start: 11/28/24 1042  Code Status: Full code Family Communication: Updated patient's wife at bedside. Level of care: Progressive Status is: Inpatient Remains inpatient appropriate because: Intractable nausea and vomiting, hematemesis and gastric cancer   Final disposition: To be determined.   55 minutes with more than 50% spent in reviewing records, counseling patient/family and coordinating care.  Consultants:  Gastroenterology General Surgery Oncology Palliative medicine  Procedures: 12/16-EGD and gastric stenting  Microbiology summarized: None  Objective: Vitals:   12/12/24 1951 12/13/24 0418 12/13/24 0921 12/13/24 1204  BP: 137/83 132/74 119/83 125/78  Pulse: 70 72 80 79  Resp: 15 16  18   Temp: 98.3 F (36.8 C) 98.1 F (36.7 C) 97.9 F (36.6 C) 98 F (36.7 C)  TempSrc: Oral Oral Oral Oral  SpO2: 99% 96% 94% 94%  Weight:      Height:         Examination:  GENERAL: No apparent distress.  Appears frail. HEENT: MMM.  Vision and hearing grossly intact.  NECK: Supple.  No apparent JVD.  RESP:  No IWOB.  Fair aeration bilaterally. CVS:  RRR. Heart sounds normal.  ABD/GI/GU: BS+. Abd soft, NTND.  MSK/EXT:  Moves extremities. No apparent deformity. No edema.  SKIN: no apparent skin lesion or wound NEURO: AA.  Oriented appropriately.  No apparent focal neuro deficit. PSYCH: Calm. Normal affect.   Sch Meds:  Scheduled Meds:  Chlorhexidine  Gluconate Cloth  6 each Topical Daily   insulin  aspart  0-15 Units Subcutaneous Q4H   mouth rinse  15 mL Mouth Rinse 4 times per day   pantoprazole  (PROTONIX ) IV  40 mg Intravenous Q12H   Continuous Infusions:  ampicillin -sulbactam (UNASYN ) IV 3 g (12/13/24 0932)   octreotide  (SANDOSTATIN ) 500 mcg in sodium chloride  0.9 % 250 mL (2 mcg/mL) infusion 50 mcg/hr (12/13/24 0631)   promethazine  (PHENERGAN ) injection (IM or IVPB) 25 mg (12/13/24 1226)   TPN CYCLIC-ADULT (ION)     PRN Meds:.acetaminophen  **OR** acetaminophen , alum & mag hydroxide-simeth, guaiFENesin -dextromethorphan , labetalol , LORazepam , menthol , mouth rinse, phenol, promethazine  (PHENERGAN ) injection (IM or IVPB), sodium chloride  flush  Antimicrobials: Anti-infectives (From admission, onward)    Start     Dose/Rate Route Frequency Ordered Stop   12/11/24 0900  Ampicillin -Sulbactam (UNASYN ) 3 g in sodium chloride  0.9 % 100 mL IVPB        3 g 200 mL/hr over 30 Minutes Intravenous Every 6 hours 12/11/24 0817     11/28/24 1200  Ampicillin -Sulbactam (UNASYN ) 3 g in sodium chloride  0.9 % 100 mL IVPB  Status:  Discontinued  3 g 200 mL/hr over 30 Minutes Intravenous Every 6 hours 11/28/24 1048 12/05/24 0909   11/28/24 0045  Ampicillin -Sulbactam (UNASYN ) 3 g in sodium chloride  0.9 % 100 mL IVPB        3 g 200 mL/hr over 30 Minutes Intravenous  Once 11/28/24 0042 11/28/24 0134        I have personally reviewed the  following labs and images: CBC: Recent Labs  Lab 12/07/24 0739 12/10/24 0908 12/11/24 0337 12/12/24 0752 12/13/24 0304 12/13/24 0819  WBC 3.8* 5.3 5.2 6.6 4.7  --   NEUTROABS 2.6 4.3 4.6  --   --  3.5  HGB 12.0* 10.4* 8.3* 6.7* 8.3*  --   HCT 35.6* 31.3* 25.8* 20.7* 25.5*  --   MCV 89.7 91.3 94.9 95.0 92.7  --   PLT 292 292 251 227 196  --    BMP &GFR Recent Labs  Lab 12/09/24 0742 12/10/24 0342 12/11/24 0337 12/12/24 0446 12/13/24 0304  NA 140 138 141 140 144  K 4.6 4.4 3.8 4.0 4.0  CL 107 104 106 105 109  CO2 21* 24 26 28 29   GLUCOSE 113* 250* 270* 259* 154*  BUN 38* 37* 35* 33* 29*  CREATININE 0.76 0.73 0.75 0.69 0.60*  CALCIUM  8.9 8.6* 8.0* 8.1* 8.0*  MG 2.2 2.1 1.7 2.0 2.0  PHOS 1.9* 2.2* 1.9* 3.1 3.7   Estimated Creatinine Clearance: 103.8 mL/min (A) (by C-G formula based on SCr of 0.6 mg/dL (L)). Liver & Pancreas: Recent Labs  Lab 12/09/24 0742 12/10/24 0342 12/11/24 0337 12/12/24 0446 12/13/24 0304  AST 31 24 17  14* 20  ALT 99* 83* 44 32 51*  ALKPHOS 191* 154* 110 94 103  BILITOT 0.7 0.4 0.5 0.3 0.3  PROT 6.5 5.8* 5.1* 5.0* 5.1*  ALBUMIN  3.2* 2.8* 2.5* 2.3* 2.4*   No results for input(s): LIPASE, AMYLASE in the last 168 hours.  No results for input(s): AMMONIA in the last 168 hours. Diabetic: No results for input(s): HGBA1C in the last 72 hours.  Recent Labs  Lab 12/12/24 1950 12/12/24 2338 12/13/24 0416 12/13/24 0726 12/13/24 1201  GLUCAP 71 186* 166* 146* 147*   Cardiac Enzymes: No results for input(s): CKTOTAL, CKMB, CKMBINDEX, TROPONINI in the last 168 hours. Recent Labs    12/11/24 1208  PROBNP 244.0   Coagulation Profile: Recent Labs  Lab 12/11/24 0337  INR 1.1    Thyroid  Function Tests: No results for input(s): TSH, T4TOTAL, FREET4, T3FREE, THYROIDAB in the last 72 hours. Lipid Profile: Recent Labs    12/13/24 0304  TRIG 132    Anemia Panel: No results for input(s): VITAMINB12,  FOLATE, FERRITIN, TIBC, IRON, RETICCTPCT in the last 72 hours. Urine analysis:    Component Value Date/Time   COLORURINE YELLOW 11/04/2024 1812   APPEARANCEUR CLEAR 11/04/2024 1812   LABSPEC 1.025 11/04/2024 1812   PHURINE 5.5 11/04/2024 1812   GLUCOSEU NEGATIVE 11/04/2024 1812   HGBUR NEGATIVE 11/04/2024 1812   BILIRUBINUR NEGATIVE 11/04/2024 1812   KETONESUR 15 (A) 11/04/2024 1812   PROTEINUR TRACE (A) 11/04/2024 1812   NITRITE NEGATIVE 11/04/2024 1812   LEUKOCYTESUR NEGATIVE 11/04/2024 1812   Sepsis Labs: Invalid input(s): PROCALCITONIN, LACTICIDVEN  Microbiology: No results found for this or any previous visit (from the past 240 hours).  Radiology Studies: CT CHEST ABDOMEN PELVIS W CONTRAST Result Date: 12/13/2024 EXAM: CT CHEST, ABDOMEN AND PELVIS WITH CONTRAST 12/12/2024 02:52:35 PM TECHNIQUE: CT of the chest, abdomen and pelvis was performed with the administration  of 100 mL of iohexol  (OMNIPAQUE ) 300 MG/ML solution. Multiplanar reformatted images are provided for review. Automated exposure control, iterative reconstruction, and/or weight based adjustment of the mA/kV was utilized to reduce the radiation dose to as low as reasonably achievable. COMPARISON: Portable chest from 12/11/2024, portable abdomen 12/10/2024, portable chest 12/09/2024, most recent abdomen and pelvis CT with contrast 11/27/2024, 11/02/2024, and 09/01/2024; and PET CT 01/07/2024. CLINICAL HISTORY: Gastric cancer, monitor; Patient with fever, subcutaneous emphysema and carcinomatosis, rule out retroperitoneal perforation. History of EGD 11/30/2024 with insertion of a gastroduodenal stent. FINDINGS: CHEST: MEDIASTINUM AND LYMPH NODES: Right IJ port catheter terminating at the superior cavoatrial junction. Heart and pericardium are unremarkable. The central airways are clear. There are a few slightly prominent bilateral hilar lymph nodes up to 1 cm in short axis, probably reactive, new from the PET CT.  There is new enlargement of a right hilar lymph node just inferior to the right main bronchus measuring 1.2 x 1.8 cm. No mediastinal adenopathy is seen. Heterogeneous 1.6 cm nodule of the posterior right lobe of the thyroid  was evaluated with thyroid  ultrasound 01/13/2024. This is unchanged in size by CT, but could not be located on ultrasound. Consider repeat ultrasound if clinically warranted. Once again, the esophagus is distended with refluxed or retained fluid nearly to the thoracic inlet. There is retained mucoid debris in the trachea to the right. The main bronchi are patent. There is a significant aspiration risk due to the esophageal fluid distention. Aspiration precautions are recommended unless already being done. LUNGS AND PLEURA: There is scarring in the lungs without a specific zonal predilection, most likely post-COVID type scarring, with patchy areas of predominantly subpleural coarse interstitial change and ground glass and areas of architectural distortion. There is patchy consolidation in the lower lobes, and scattered opacities in the posterior left upper lobe consistent with pneumonia or aspiration. This finding has increased especially in the left lower lobe since 11/27/2024. No other focal infiltrate is seen. No pleural effusion or pneumothorax. ABDOMEN AND PELVIS: LIVER: The liver is mildly steatotic without evidence of metastasis. GALLBLADDER AND BILE DUCTS: Gallbladder is unremarkable. No biliary ductal dilatation. SPLEEN: No acute abnormality. PANCREAS: No acute abnormality. ADRENAL GLANDS: No adrenal mass. KIDNEYS, URETERS AND BLADDER: There are stones measuring 6 mm and 4 mm in the right kidney collecting system. No left nephrolithiasis or hydronephrosis. No perinephric or periureteral stranding. There is air in the anterior bladder, which could be due to catheterization, but there is air in the bladder base in the wall consistent with emphysematous cystitis. This was not seen  previously. Urinalysis and appropriate treatment is recommended. GI AND BOWEL: Circumferential gastric thickening continues to be seen, with adjacent stranding change. Antropyloric stenting has been performed. There is continued dilatation in the duodenum, maximum 6 cm caliber without an appreciable transition. There are thickened jejunal folds consistent with nonspecific enteritis. The small bowel is otherwise normal caliber. The appendix is normal. No evidence of focal colitis or diverticulitis. There is a small volume of ascites. REPRODUCTIVE ORGANS: No acute abnormality. PERITONEUM AND RETROPERITONEUM: No free air. VASCULATURE: Aorta is normal in caliber. There is an IVC filter. No other significant vascular findings. ABDOMINAL AND PELVIS LYMPH NODES: No lymphadenopathy. BONES AND SOFT TISSUES: There is multilevel bridging enthesopathy of the thoracic spine. No thoracic bone metastasis is seen. There are mild degenerative changes of the lumbar spine. Since 11/27/2024, there is patchy substernal pneumomediastinum, source indeterminate. This extends cephalad to the supraclavicular fossae and perivascular regions of the  neck. IMPRESSION: 1. New patchy substernal pneumomediastinum of indeterminate source, extending into the neck. 2. Patchy consolidation in the lower lobes and scattered opacities in the posterior left upper lobe consistent with pneumonia or aspiration, increased since 11/27/2024. 3. Esophageal distention with retained/refluxed fluid to the thoracic inlet, with significant aspiration risk; aspiration precautions are recommended. 4. 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 5. Circumferential gastric thickening with adjacent stranding change and antropyloric stenting, with continued duodenal dilatation (maximum 6 cm) without transition and thickened jejunal folds consistent with nonspecific enteritis. 6. New mildly enlarged right hilar lymph node and  a few slightly prominent bilateral hilar lymph nodes; no evidence of metastatic disease. 7. Right-sided nephrolithiasis. 8. Small volume of ascites, new. 9. These results will be telephoned to the referring provider or the referring providers representative by professional radiology assistant [facility]) personnel, with communication documented in the [facility] dashboard. Electronically signed by: Francis Quam MD 12/13/2024 02:46 AM EST RP Workstation: [id number]        Ujbz T. Donatello Kleve [facility]  If 7PM-7AM, please contact night-coverage [email or web address] 12/13/2024, 12:39 PM   "

## 2024-12-13 NOTE — Progress Notes (Signed)
 "  Palliative Medicine Inpatient Follow Up Note HPI: 58 y.o. male with past medical history of gastric cancer (new chemotherapy 12/12), DVT with IVC filter admitted on 11/27/2024 with nausea and vomiting with coffee ground emesis. Has had a prolonged hospitalization with complications - SQ air on chest xrays. Palliative care is supporting additional goals of care conversations.   Today's Discussion 12/13/2024  *Please note that this is a verbal dictation therefore any spelling or grammatical errors are due to the Dragon [employer] One system interpretation.  I reviewed the chart notes including nursing notes from today, progress notes from today. I also reviewed vital signs, nursing flowsheets, medication administrations record, labs, and imaging.    I met with Jenny this morning in the company of his wife, Orvil. I shared with her my role in helping with Elijha from a Palliative perspective. I shared with her that under difficult circumstances Muad is in that we have to talk about the best case, worst case scenarios and what path is best to travel moving forward.  Patients wife shares with me the frustrations she has had with the medical system not just with [facility] but also with her father previous to him passing. I allowed her time and space to express herself.   Orvil shares the inability to meet tomorrow in the setting of an planned medical study (EGD). She shares she would be able to be present on the following days. I shared that I would reach out to her daughter to see what date might be best.  I spoke with Theodoro who does want to talk about his prognosis and overall condition. He shares that he would prefer a straight forward approach so that he can plan for the future. I shared I will speak to Dr. Cloretta and Dr. Gonfa to determine their consensus on his overall medical state.  We discussed that Alyxander has been through various storms in relation to his health inclusive of  covid in 2021 requiring intubation. He was told he would not survive and he did. He also had another significant health set back which he was able to get through. Orvil states that they have strong faith and are leaning on this for them.  We discussed Juwuan's three children the youngest who is 34 years old. We reviewed how difficult all of this is to conceptualize. I shared that I would help support additional conversation(s) in the oncoming days to better help Ayron and his wife plan for the next steps.   Questions and concerns addressed/Palliative Support Provided.   Objective Assessment: Vital Signs Vitals:   12/13/24 0921 12/13/24 1204  BP: 119/83 125/78  Pulse: 80 79  Resp:  18  Temp: 97.9 F (36.6 C) 98 F (36.7 C)  SpO2: 94% 94%    Intake/Output Summary (Last 24 hours) at 12/13/2024 1345 Last data filed at 12/13/2024 0631 Gross per 24 hour  Intake 5401.45 ml  Output --  Net 5401.45 ml   Last Weight  Most recent update: 12/11/2024  5:14 AM    Weight  74.7 kg (164 lb 10.9 oz)            Gen:  Middle aged Hispanic M chronically ill appearing HEENT: Dry mucous membranes CV: Regular rate and rhythm  PULM: On RA, breathing is even and nonlabored  EXT: No edema  Neuro: Alert and oriented x3   SUMMARY OF RECOMMENDATIONS   Full Code / Full scope of care  Plan for family meeting on Wednesday  Discussed concerns associated with patients disease - patient and his wife share a strong faith and desire to fight  Ongoing PMT support ______________________________________________________________________________________ Rosaline Becton [facility] Palliative Medicine Team Team Cell Phone: [phone] Please utilize secure chat with additional questions, if there is no response within 30 minutes please call the above phone number  Time Spent: 68  Palliative Medicine Team providers are available by phone from 7am to 7pm daily and can be reached through the team  cell phone.  Should this patient require assistance outside of these hours, please call the patient's attending physician.     "

## 2024-12-13 NOTE — Plan of Care (Signed)

## 2024-12-13 NOTE — Progress Notes (Signed)
 Nutrition Follow-up  DOCUMENTATION CODES:   Severe malnutrition in context of chronic illness  INTERVENTION:   -Cyclic TPN management per Pharmacy - Daily weights while on TPN.   NUTRITION DIAGNOSIS:   Severe Malnutrition related to chronic illness (gastric cancer) as evidenced by severe fat depletion, severe muscle depletion, percent weight loss (24% weight loss in less than 3 months).  Ongoing.  GOAL:   Patient will meet greater than or equal to 90% of their needs  TPN  MONITOR:   Diet advancement, Labs, Weight trends  ASSESSMENT:   58 y.o. male with PMH of gastric cancer on chemo and recent esophagitis who presented due to nausea, vomiting and coffee-ground emesis 2 days after he started new chemotherapy. Admitted for intractable nausea and vomiting, dilated small bowel loops with concern for partial SBO, and coffee-ground emesis.  12/13 Admit; NPO  12/16 s/p upper GI endoscopy with gastric stent placement and NGT placement; CLD 12/17 NPO 12/18 CLD  12/19 FLD 12/20 CLD; TPN initiated 12/21 NGT came out  Continues to have some episodes of hematemesis but improving somewhat. Patient now on cyclic TPN, providing 2181 kcals and 108g protein. Plan continues to be TPN at home once able to be discharged. [facility] involved for home infusion.   Admission weight: 154 lbs Current weight: 164 lbs  Medications: Octreotide , Phenergan   Labs reviewed: CBGs: 146-147  Diet Order:   Diet Order             Diet NPO time specified Except for: Ice Chips, Sips with Meds, Other (See Comments)  Diet effective now                   EDUCATION NEEDS:   Education needs have been addressed  Skin:  Skin Assessment: Reviewed RN Assessment  Last BM:  12/27  Height:   Ht Readings from Last 1 Encounters:  11/30/24 6' (1.829 m)    Weight:   Wt Readings from Last 1 Encounters:  12/11/24 74.7 kg    Ideal Body Weight:  80.91 kg  BMI:  Body mass index is 22.34  kg/m.  Estimated Nutritional Needs:   Kcal:  2100-2450 kcals  Protein:  105-120 grams  Fluid:  >/= 2.1L   Morna Lee, MS, RD, LDN Inpatient Clinical Dietitian Contact via Secure chat

## 2024-12-14 DIAGNOSIS — J690 Pneumonitis due to inhalation of food and vomit: Secondary | ICD-10-CM | POA: Diagnosis not present

## 2024-12-14 DIAGNOSIS — K920 Hematemesis: Secondary | ICD-10-CM | POA: Diagnosis not present

## 2024-12-14 DIAGNOSIS — C169 Malignant neoplasm of stomach, unspecified: Secondary | ICD-10-CM | POA: Diagnosis not present

## 2024-12-14 DIAGNOSIS — C163 Malignant neoplasm of pyloric antrum: Secondary | ICD-10-CM | POA: Diagnosis not present

## 2024-12-14 DIAGNOSIS — Z515 Encounter for palliative care: Secondary | ICD-10-CM | POA: Diagnosis not present

## 2024-12-14 DIAGNOSIS — D509 Iron deficiency anemia, unspecified: Secondary | ICD-10-CM | POA: Diagnosis not present

## 2024-12-14 DIAGNOSIS — Z7189 Other specified counseling: Secondary | ICD-10-CM | POA: Diagnosis not present

## 2024-12-14 LAB — COMPREHENSIVE METABOLIC PANEL WITH GFR
ALT: 81 U/L — ABNORMAL HIGH (ref 0–44)
AST: 62 U/L — ABNORMAL HIGH (ref 15–41)
Albumin: 2.5 g/dL — ABNORMAL LOW (ref 3.5–5.0)
Alkaline Phosphatase: 161 U/L — ABNORMAL HIGH (ref 38–126)
Anion gap: 11 (ref 5–15)
BUN: 26 mg/dL — ABNORMAL HIGH (ref 6–20)
CO2: 24 mmol/L (ref 22–32)
Calcium: 8.1 mg/dL — ABNORMAL LOW (ref 8.9–10.3)
Chloride: 103 mmol/L (ref 98–111)
Creatinine, Ser: 0.7 mg/dL (ref 0.61–1.24)
GFR, Estimated: >60 mL/min
Glucose, Bld: 155 mg/dL — ABNORMAL HIGH (ref 70–99)
Potassium: 4.3 mmol/L (ref 3.5–5.1)
Sodium: 138 mmol/L (ref 135–145)
Total Bilirubin: 0.4 mg/dL (ref 0.0–1.2)
Total Protein: 5.3 g/dL — ABNORMAL LOW (ref 6.5–8.1)

## 2024-12-14 LAB — GLUCOSE, CAPILLARY
Glucose-Capillary: 151 mg/dL — ABNORMAL HIGH (ref 70–99)
Glucose-Capillary: 142 mg/dL — ABNORMAL HIGH (ref 70–99)
Glucose-Capillary: 104 mg/dL — ABNORMAL HIGH (ref 70–99)
Glucose-Capillary: 87 mg/dL (ref 70–99)
Glucose-Capillary: 160 mg/dL — ABNORMAL HIGH (ref 70–99)

## 2024-12-14 LAB — MAGNESIUM: Magnesium: 2.1 mg/dL (ref 1.7–2.4)

## 2024-12-14 LAB — CBC WITH DIFFERENTIAL/PLATELET
Abs Immature Granulocytes: 0.22 K/uL — ABNORMAL HIGH (ref 0.00–0.07)
Basophils Absolute: 0 K/uL (ref 0.0–0.1)
Basophils Relative: 1 %
Eosinophils Absolute: 0.1 K/uL (ref 0.0–0.5)
Eosinophils Relative: 1 %
HCT: 26.9 % — ABNORMAL LOW (ref 39.0–52.0)
Hemoglobin: 8.8 g/dL — ABNORMAL LOW (ref 13.0–17.0)
Immature Granulocytes: 4 %
Lymphocytes Relative: 14 %
Lymphs Abs: 0.7 K/uL (ref 0.7–4.0)
MCH: 30.7 pg (ref 26.0–34.0)
MCHC: 32.7 g/dL (ref 30.0–36.0)
MCV: 93.7 fL (ref 80.0–100.0)
Monocytes Absolute: 0.2 K/uL (ref 0.1–1.0)
Monocytes Relative: 4 %
Neutro Abs: 3.9 K/uL (ref 1.7–7.7)
Neutrophils Relative %: 76 %
Platelets: 210 K/uL (ref 150–400)
RBC: 2.87 MIL/uL — ABNORMAL LOW (ref 4.22–5.81)
RDW: 14.8 % (ref 11.5–15.5)
Smear Review: NORMAL
WBC: 5.1 K/uL (ref 4.0–10.5)
nRBC: 0.8 % — ABNORMAL HIGH (ref 0.0–0.2)

## 2024-12-14 LAB — PHOSPHORUS: Phosphorus: 3.1 mg/dL (ref 2.5–4.6)

## 2024-12-14 MED ORDER — SUCRALFATE 1 GM/10ML PO SUSP
1.0000 g | Freq: Three times a day (TID) | ORAL | Status: DC
  Administered 2024-12-14 – 2025-01-05 (×62): 1 g via ORAL
  Filled 2024-12-14 (×63): qty 10

## 2024-12-14 MED ORDER — INSULIN ASPART 100 UNIT/ML IJ SOLN
0.0000 [IU] | INTRAMUSCULAR | Status: AC
  Administered 2024-12-14: 3 [IU] via SUBCUTANEOUS
  Administered 2024-12-15: 2 [IU] via SUBCUTANEOUS
  Administered 2024-12-15 – 2024-12-16 (×2): 5 [IU] via SUBCUTANEOUS
  Filled 2024-12-14: qty 5
  Filled 2024-12-14: qty 3
  Filled 2024-12-14: qty 5

## 2024-12-14 MED ORDER — TRAVASOL 10 % IV SOLN
INTRAVENOUS | Status: AC
  Filled 2024-12-14: qty 1080

## 2024-12-14 MED ORDER — PHENYLEPHRINE HCL-NACL 20-0.9 MG/250ML-% IV SOLN
INTRAVENOUS | Status: AC
  Filled 2024-12-14: qty 500

## 2024-12-14 NOTE — Progress Notes (Signed)
 PHARMACY - TOTAL PARENTERAL NUTRITION CONSULT NOTE   Indication: intolerance to enteral feeds  Patient Measurements: Height: 6' (182.9 cm) Weight: 73.9 kg (162 lb 14.7 oz) IBW/kg (Calculated) : 77.6 TPN AdjBW (KG): 70 Body mass index is 22.1 kg/m. Usual Weight:   Assessment: 58 yoM admitted on 12/13 with intractable nausea and vomiting, dilated small bowel loops, and coffee-ground emesis. 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  He underwent EGD 12/16 showing esophagitis,gastric stensosis,  stent placed, impaired peristalsis.  Trial NG tube clamping and CLD. Pharmacy is consulted to dose TPN on 12/20 for intolerance to enteral feeding.    Glucose / Insulin : No hx DM, no PTA meds. - Dexamethasone  2mg  IV q12h- dc'd 12/24 am  - Dexamethasone  10 mg IV given 12/23 as premed for chemo - Octreotide  infusion resumed 12/25 @50  mcg/hr (noted that Octreotide , especially at higher doses/frequencies, can cause glucose dysregulation). Discussed with MD about transitioning back to Octreotide  50mcg q8h, however, patient with hematemesis so continuing with Octreotide  infusion for now.  - On sSSI, has used 12units/24 hours -  CBGs  (goal <150): 130-155  (most within goal range) Electrolytes: All lytes WNL including CoCa Renal: SCr < 1 (stable), BUN 29 (elevated but slightly improved) Hepatic:  AST/ALT trending up with 62/81 --> monitor closely; alk phos up 161 - Trig 164 (12/23), 132 (12/29) - albumin  low at 2.5 Intake / Output - MIVF: none - I/O: +1237mL/24 hrs - Emesis/NG: 300 mL - Urine output: 0.1 mL/kg/hr - LBM: 12/27 GI Imaging: - 12/18 Abd xray: No bowel dilatation or evidence of obstruction. Retained gastric fluid. 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 Prosthesis placed. Duodenal dilation deformity  - 12/25 DG abd: nonobstructive bowel gas pattern - 12/27 DG abd: nonobstructive bowel gas pattern - 12/28 chest/abd CT: Esophageal  distention. Nonspecific Enteritis. New mildly enlarged right hilar lymph node and a few slightly prominent bilateral hilar lymph nodes. New small volume of ascites  GI Surgeries / Procedures:  - 12/16 EGD: esophagitis  GI meds:   - Protonix  40mg  IV  q12h >> - Octreotide  50 sq q8hrs >> DC'd 12/24; resumed 12/25 @50mcg /hr  - Decadron  2 gm IV q12hrs >> DC'd 12/24  Central access: Implanted Port (01/22/24) TPN start date: 12/20   Nutritional Goals: Goal TPN rate is 90 mL/hr and provides 108 g of protein and 2182 kcals per day)  RD Assessment: Estimated Needs Total Energy Estimated Needs: 2100-2450 kcals Total Protein Estimated Needs: 105-120 grams Total Fluid Estimated Needs: >/= 2.1L  Current Nutrition:  Diet: NPO, Cyclic TPN  Plan:  Now - TPN pending insurance approval for outpatient use - GOC discussions ongoing with family  At 1800: - Continue TPN to cycle over 18 hrs (glucose infusion rate 2.14 - 4.25 mg/kg/min) - Electrolytes in TPN:  Increase Na to 60 mEq/L K 50 mEq/L Ca 5 mEq/L Mg 5 mEq/L Continue Phos 20 mmol/L Cl:Ac  to 1:1 - Add standard MVI and trace elements to TPN - Reducing the TPN infusion duration to 18hrs did improve CBGs given the decrease in glucose infusion rate. Can likely transition back to 12hr cyclic TPN once the octreotide  infusion is discontinued. If patient continues to be hospitalized and on Octreotide  infusion, may need to consider reverting back to continuous TPN given continued lack of CBG control.  Change sensitive SSI from q4h to four times a day at 0300, 1300, 1600, 2000--> CBGs: 2 hours past cyclic TPN start +  1 hour past cyclic TPN d/c + middle of TPN  infusion + while off TPN (total 4 CBGs/24 hour period) - Monitor TPN labs on Mon/Thurs and PRN - CMET and phos level on 12/31

## 2024-12-14 NOTE — Progress Notes (Signed)
 IP PROGRESS NOTE  Subjective:   Henry May continues to have nausea and hematemesis.  No bowel movement or flatus yesterday.  No pain.  No dyspnea.  His daughter was at the bedside when I saw him at approximately 6:30 AM.   Objective: Vital signs in last 24 hours: Blood pressure 127/76, pulse 92, temperature 99.6 F (37.6 C), temperature source Oral, resp. rate 20, height 6' (1.829 m), weight 162 lb 14.7 oz (73.9 kg), SpO2 97%.  Intake/Output from previous day: 12/29 0701 - 12/30 0700 In: 1691.1 [I.V.:1283.1; IV Piggyback:408.1] Out: 400 [Urine:100; Emesis/NG output:300]  Physical Exam:  HEENT: No thrush, dried blood over the lips, no palpable subcutaneous emphysema Lungs: Creased breath sounds with inspiratory rhonchi at the lower posterior chest bilaterally, no respiratory distress Cardiac: Regular rate and rhythm Abdomen: Soft, nontender, no hepatomegaly Extremities: No leg edema   Portacath/PICC-without erythema  Lab Results: Recent Labs    12/13/24 0304 12/14/24 0408  WBC 4.7 5.1  HGB 8.3* 8.8*  HCT 25.5* 26.9*  PLT 196 210      BMET Recent Labs    12/13/24 0304 12/14/24 0408  NA 144 138  K 4.0 4.3  CL 109 103  CO2 29 24  GLUCOSE 154* 155*  BUN 29* 26*  CREATININE 0.60* 0.70  CALCIUM  8.0* 8.1*    Lab Results  Component Value Date   CEA 3.57 01/15/2024      Medications: I have reviewed the patient's current medications.  Assessment/Plan: Gastric cancer 11/21/2023 EGD-gastric body with infiltrative looking lesion; biopsy shows involvement of a hypercellular lesion that suggests diffuse carcinoma by morphology CTs abdomen/pelvis 11/28/2023-diffuse fatty infiltration of the liver; gastric wall thickening; lymph nodes up to 6 mm in diameter near the stomach wall. 12/31/2023 upper endoscopy-large diffuse friable infiltrative polypoid and ulcerated circumferential mass found in the gastric body.  Scope was passed beyond the mass with normal  antrum/pylorus.  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  Negative for HER2 (1+); mismatch repair protein IHC normal; PD-L1 CPS 0%; CLDN18 positive: 90% of tumor cells with 2+/3+ membrane staining PET scan 01/07/2024-circumferential hypermetabolic gastric mucosal thickening.  Ill-defined hypermetabolic lymph nodes in the gastrohepatic ligament.  Intense hypermetabolic activity associated with the right lobe of the thyroid  gland.  Small hypermetabolic right axillary node favored reactive. 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  Gastric wall thickening slightly improved. Cycle 7 oxaliplatin , zolbetuximab 05/05/2024, Fulphila  Cycle 8 zolbetuximab 05/20/2024, oxaliplatin  held due to neuropathy, no Fulphila  Cycle 9 oxaliplatin /zolbetuximab 06/03/2024, Fulphila  Cycle 10 oxaliplatin /zolbetuximab 06/17/2024, Fulphila  Cycle 11 oxaliplatin /zolbetuximab 06/30/2024, Fulphila , oxaliplatin  dose reduced Cycle 12 zolbetuximab 07/15/2024, oxaliplatin  held secondary to neuropathy Cycle 13 zolbetuximab 07/29/2024, oxaliplatin  held secondary to neuropathy Cycle 14 zolbetuximab 08/26/2024, oxaliplatin  held secondary to neuropathy 09/01/2024 CTs-mild distal gastric wall thickening without discrete mass.  No metastatic disease. Cycle 15 zolbetuximab 09/09/2024,  oxaliplatin  held due to neuropathy Cycle 16 zolbetuximab 09/30/2024, oxaliplatin  held due to neuropathy Cycle 17 zolbetuximab 10/21/2024, oxaliplatin  held due to neuropathy 11/03/2024 CT abdomen/pelvis: Diffuse mild wall thickening of the distal gastric body  and antrum-stable, mild increase in a right gastric lymph node small volume ascites without omental nodularity, proximal jejunal mural thickening 11/04/2024 upper GI: Narrowing of the gastric antrum, normal gastric emptying, dilated duodenum and proximal jejunum without evidence of a bowel obstruction 11/18/2024 upper endoscopy: Tumor in the gastric body and antrum with antral narrowing, dilated duodenum without evidence of mass or obstruction, esophagitis: Biopsy of the gastric mass-poorly differentiated adenocarcinoma with focal signet ring morphology, esophagus biopsy: Squamous mucosa with focal ulceration Cycle 1 weekly cisplatin  11/26/2024 Cycle 2 weekly cisplatin  12/07/2024 12/12/2024 CTs: Pneumomediastinum extending into the neck, lower lobe consolidation, esophagus distention, air in the bladder, new mildly enlarged right hilar node, new small volume ascites Early satiety, postprandial abdominal pain, weight loss secondary to #1 History of bilateral lower extremity DVT 2021-anticoagulation discontinued due to massive retroperitoneal bleed, IVC filter placed 08/24/2020 Hospitalized with acute respiratory failure due to COVID-19 08/06/2020 - 09/04/2020 History of massive retroperitoneal bleed secondary to anticoagulation September 2021 Thyroid  ultrasound 01/13/2024-no abnormal nodule identified in the right lobe.  1.1 cm thyroid  isthmus nodule meets criteria for 1 year follow-up ultrasound. Admission 02/01/2024 with increased abdominal pain/ascites Hospitalization with altered mental status 02/05/2024 through 02/13/2024; question rare case of 5-FU psychosis.  Improved 02/16/2024.  Mental status at baseline 02/23/2024. Neutropenia 02/16/2024 Mucositis  02/16/2024.  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  Persistent cold sensitivity 05/20/2024, oxaliplatin  held.  Cold sensitivity resolved 06/03/2024, oxaliplatin  resumed.  Mild loss of vibratory sense, oxaliplatin  dose reduced 06/29/2024 Admission 11/28/2024 with intractable nausea and vomiting 11/27/2024 CT Abdo/pelvis: Bilateral lower lobe aspiration/pneumonia, thick-walled gastric body/antrum, dilated proximal small bowel, small volume pelvic ascites 11/30/2024 EGD: Malignant appearing severe stenosis at the gastric antrum-stent placed, dilated duodenum with retained fluid 15.  Anemia secondary to GI bleeding 12/12/2024 2 units RBCs   Henry May has metastatic gastric cancer.  There is evidence of disease progression with persistent/progressive tumor in the stomach and intractable nausea/vomiting.  The omental disease on presentation last year remains significantly improved, by CT criteria.  He responded well to initial treatment with oxaliplatin  based chemotherapy.  He has neuropathy symptoms.  He had psychosis following 5-fluorouracil .  He began a trial of salvage therapy with cisplatin  on 11/26/2024.  He completed cycle 2 on 12/07/2024.  He is now admitted with intractable nausea/vomiting, potentially related to a small bowel obstruction, ileus from the carcinomatosis, in addition to persistent tumor in the stomach.   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  There was retained fluid in the stomach and duodenum.  A gastric antral stent was placed.  He continues to have nausea and vomiting of liquids despite placement of the gastric antral stent.  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  He developed recurrent severe nausea and  vomiting 12/08/2024.  It is possible the cisplatin  given 12/07/2024 contributed.  The nausea has improved with octreotide .  He now has hematemesis, likely secondary to the known gastric mass.  The hemoglobin is stable today.  Henry May has developed CT evidence of progressive aspiration pneumonia.  He also has pneumomediastinum with subcutaneous emphysema extending to the neck.  The significance of the emphysematous findings is unclear.  I reviewed the CT images.  He may have a tumor related microperforation or perforation related to emesis.  Henry May has been evaluated by the palliative care service.  A family meeting is scheduled for 12/15/2024.  He stated again this morning that he would like to continue with treatment of the cancer.  He understands the chance of clinical improvement with further systemic therapy is small.  He would like to consider repeat treatment with 5-fluorouracil .  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  He has completed 2 treatments with cisplatin .  Week #3 is planned for tomorrow.  We can consider salvage therapy with paclitaxel or docetaxel as an outpatient.  Ramucirumab may increase the risk of bleeding.   Recommendations: Continue TPN, IV fluids when off of TPN Continue octreotide , Phenergan , and Ativan  Antibiotics for aspiration pneumonia Continue goals of care discussion Plan for-week #3 cisplatin  12/15/2020   LOS: 16 days   Arley Hof, MD   12/14/2024, 6:49 AM

## 2024-12-14 NOTE — Plan of Care (Signed)
  Problem: Education: Goal: Knowledge of General Education information will improve Description: Including pain rating scale, medication(s)/side effects and non-pharmacologic comfort measures Outcome: Progressing   Problem: Health Behavior/Discharge Planning: Goal: Ability to manage health-related needs will improve Outcome: Progressing   Problem: Clinical Measurements: Goal: Will remain free from infection Outcome: Progressing Goal: Respiratory complications will improve Outcome: Progressing Goal: Cardiovascular complication will be avoided Outcome: Progressing   Problem: Activity: Goal: Risk for activity intolerance will decrease Outcome: Progressing   Problem: Nutrition: Goal: Adequate nutrition will be maintained Outcome: Progressing   Problem: Coping: Goal: Level of anxiety will decrease Outcome: Progressing   Problem: Elimination: Goal: Will not experience complications related to urinary retention Outcome: Progressing   Problem: Pain Managment: Goal: General experience of comfort will improve and/or be controlled Outcome: Progressing   Problem: Safety: Goal: Ability to remain free from injury will improve Outcome: Progressing   Problem: Skin Integrity: Goal: Risk for impaired skin integrity will decrease Outcome: Progressing

## 2024-12-14 NOTE — Progress Notes (Addendum)
 "    [facility] Progress Note  CC: Gastric cancer, hematemesis    Subjective: Daughter reported her father vomited blood at 1:30 AM and 7:30 AM, volume of hematemesis was large at 7:30 AM.  He denies having any nausea at this time.  No abdominal pain.  No BM thus far today.  He is tolerating popsicles, daughter brings from home.   Objective:  Vital signs in last 24 hours: Temp:  [98 F (36.7 C)-99.6 F (37.6 C)] 99.6 F (37.6 C) (12/30 0508) Pulse Rate:  [73-92] 92 (12/30 0508) Resp:  [18-20] 20 (12/29 1953) BP: (120-127)/(76-78) 127/76 (12/30 0508) SpO2:  [94 %-98 %] 97 % (12/30 0508) Weight:  [73.9 kg] 73.9 kg (12/30 0500) Last BM Date : 12/11/24 General: Alert 58 year old male chronically ill and fatigued appearing in no acute distress. Heart: Regular rate and rhythm, no murmurs. Pulm: Breath sounds clear.  On oxygen  2 L nasal cannula. Abdomen: Abdomen nondistended.  Nontender.  Positive bowel sounds to all 4 quadrants.  No palpable mass. Extremities: No lower extremity edema. Neurologic:  Alert and oriented x 4.  Speech is clear.  Moves all extremities equally. Psych:  Alert and cooperative. Normal mood and affect.  Intake/Output from previous day: 12/29 0701 - 12/30 0700 In: 1691.1 [I.V.:1283.1; IV Piggyback:408.1] Out: 400 [Urine:100; Emesis/NG output:300] Intake/Output this shift: No intake/output data recorded.  Lab Results: Recent Labs    12/12/24 0752 12/13/24 0304 12/14/24 0408  WBC 6.6 4.7 5.1  HGB 6.7* 8.3* 8.8*  HCT 20.7* 25.5* 26.9*  PLT 227 196 210   BMET Recent Labs    12/12/24 0446 12/13/24 0304 12/14/24 0408  NA 140 144 138  K 4.0 4.0 4.3  CL 105 109 103  CO2 28 29 24   GLUCOSE 259* 154* 155*  BUN 33* 29* 26*  CREATININE 0.69 0.60* 0.70  CALCIUM  8.1* 8.0* 8.1*   LFT Recent Labs    12/14/24 0408  PROT 5.3*  ALBUMIN  2.5*  AST 62*  ALT 81*  ALKPHOS 161*  BILITOT 0.4   PT/INR No results for input(s): LABPROT,  INR in the last 72 hours. Hepatitis Panel No results for input(s): HEPBSAG, HCVAB, HEPAIGM, HEPBIGM in the last 72 hours.  CT CHEST ABDOMEN PELVIS W CONTRAST Result Date: 12/13/2024 EXAM: CT CHEST, ABDOMEN AND PELVIS WITH CONTRAST 12/12/2024 02:52:35 PM TECHNIQUE: CT of the chest, abdomen and pelvis was performed with the administration of 100 mL of iohexol  (OMNIPAQUE ) 300 MG/ML solution. Multiplanar reformatted images are provided for review. Automated exposure control, iterative reconstruction, and/or weight based adjustment of the mA/kV was utilized to reduce the radiation dose to as low as reasonably achievable. COMPARISON: Portable chest from 12/11/2024, portable abdomen 12/10/2024, portable chest 12/09/2024, most recent abdomen and pelvis CT with contrast 11/27/2024, 11/02/2024, and 09/01/2024; and PET CT 01/07/2024. CLINICAL HISTORY: Gastric cancer, monitor; Patient with fever, subcutaneous emphysema and carcinomatosis, rule out retroperitoneal perforation. History of EGD 11/30/2024 with insertion of a gastroduodenal stent. FINDINGS: CHEST: MEDIASTINUM AND LYMPH NODES: Right IJ port catheter terminating at the superior cavoatrial junction. Heart and pericardium are unremarkable. The central airways are clear. There are a few slightly prominent bilateral hilar lymph nodes up to 1 cm in short axis, probably reactive, new from the PET CT. There is new enlargement of a right hilar lymph node just inferior to the right main bronchus measuring 1.2 x 1.8 cm. No mediastinal adenopathy is seen. Heterogeneous 1.6 cm nodule of the posterior right lobe of the thyroid  was evaluated  with thyroid  ultrasound 01/13/2024. This is unchanged in size by CT, but could not be located on ultrasound. Consider repeat ultrasound if clinically warranted. Once again, the esophagus is distended with refluxed or retained fluid nearly to the thoracic inlet. There is retained mucoid debris in the trachea to the right. The main  bronchi are patent. There is a significant aspiration risk due to the esophageal fluid distention. Aspiration precautions are recommended unless already being done. LUNGS AND PLEURA: There is scarring in the lungs without a specific zonal predilection, most likely post-COVID type scarring, with patchy areas of predominantly subpleural coarse interstitial change and ground glass and areas of architectural distortion. There is patchy consolidation in the lower lobes, and scattered opacities in the posterior left upper lobe consistent with pneumonia or aspiration. This finding has increased especially in the left lower lobe since 11/27/2024. No other focal infiltrate is seen. No pleural effusion or pneumothorax. ABDOMEN AND PELVIS: LIVER: The liver is mildly steatotic without evidence of metastasis. GALLBLADDER AND BILE DUCTS: Gallbladder is unremarkable. No biliary ductal dilatation. SPLEEN: No acute abnormality. PANCREAS: No acute abnormality. ADRENAL GLANDS: No adrenal mass. KIDNEYS, URETERS AND BLADDER: There are stones measuring 6 mm and 4 mm in the right kidney collecting system. No left nephrolithiasis or hydronephrosis. No perinephric or periureteral stranding. There is air in the anterior bladder, which could be due to catheterization, but there is air in the bladder base in the wall consistent with emphysematous cystitis. This was not seen previously. Urinalysis and appropriate treatment is recommended. GI AND BOWEL: Circumferential gastric thickening continues to be seen, with adjacent stranding change. Antropyloric stenting has been performed. There is continued dilatation in the duodenum, maximum 6 cm caliber without an appreciable transition. There are thickened jejunal folds consistent with nonspecific enteritis. The small bowel is otherwise normal caliber. The appendix is normal. No evidence of focal colitis or diverticulitis. There is a small volume of ascites. REPRODUCTIVE ORGANS: No acute  abnormality. PERITONEUM AND RETROPERITONEUM: No free air. VASCULATURE: Aorta is normal in caliber. There is an IVC filter. No other significant vascular findings. ABDOMINAL AND PELVIS LYMPH NODES: No lymphadenopathy. BONES AND SOFT TISSUES: There is multilevel bridging enthesopathy of the thoracic spine. No thoracic bone metastasis is seen. There are mild degenerative changes of the lumbar spine. Since 11/27/2024, there is patchy substernal pneumomediastinum, source indeterminate. This extends cephalad to the supraclavicular fossae and perivascular regions of the neck. IMPRESSION: 1. New patchy substernal pneumomediastinum of indeterminate source, extending into the neck. 2. Patchy consolidation in the lower lobes and scattered opacities in the posterior left upper lobe consistent with pneumonia or aspiration, increased since 11/27/2024. 3. Esophageal distention with retained/refluxed fluid to the thoracic inlet, with significant aspiration risk; aspiration precautions are recommended. 4. 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 5. Circumferential gastric thickening with adjacent stranding change and antropyloric stenting, with continued duodenal dilatation (maximum 6 cm) without transition and thickened jejunal folds consistent with nonspecific enteritis. 6. New mildly enlarged right hilar lymph node and a few slightly prominent bilateral hilar lymph nodes; no evidence of metastatic disease. 7. Right-sided nephrolithiasis. 8. Small volume of ascites, new. 9. These results will be telephoned to the referring provider or the referring providers representative by professional radiology assistant [facility]) personnel, with communication documented in the [facility] dashboard. 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 Admitted 11/27/2024 with intractable nausea/vomiting/coffee-ground emesis with stable hemoglobin (Hb 14.5 - baseline).  Assessment / Plan:  58 year old male with stage IV gastric adenocarcinoma admitted with intractable N/V with gastric outlet/antral obstruction. CTAP 12/13 showed thick walled gastric body and antrum without a definitive mass with nodularity along the pylorus and proximal dilated small bowel loops, questionable mild obstruction without a discrete transition point. S/P EGD with gastric stent placement 12/16. On TPN. Patient developed hematemesis 12/25 +/- hemoptysis. Octreotide  restarted. Repeat CTAP 12/28 showed circumferential gastric thickening with antropyloric stent with continued dilatation in the duodenum 6 cm caliber without an appreciable transition, thickened jejunal folds consistent with nonspecific enteritis and small volume of ascites.  Palliative care consulted, patient wishes to continue treatment for cancer.  Patient continues to have hematemesis, last occurred at 1:30 AM and 7:30 AM.  Afebrile.  Hemodynamically stable. - NPO except ice, popsicles and meds with sips of water  - Continue TPN - IV fluids per the hospitalist  - Carafate  slurry 1 g p.o. 3 times daily - Continue PPI IV bid - Continue Promethazine  25 mg IV every 6 hours as needed - Continue Octreotide  infusion at 50 mcg/hr - Transfuse for Hg level < 7 or as needed if symptomatic - No plans for endoscopic evaluation at this juncture as benefits do not outweigh the risks - Oncology following along - Palliative care planning on family meeting tomorrow to further establish goals of care   - Await further recommendations per Dr. San   Normocytic anemia. Secondary to gastric adenocarcinoma and hematemesis. Hg 12 -> 10.4 -> 8.3 -> 6.7 on 12/28 -> transfused 2 units of PRBCs  -> Hg 8.3 -> 8.8.  - Transfuse for Hg level < 7 or as needed if symptomatic    Possible aspiration pneumonitis/pneumonia. CXR showed patchy airspace opacities in RML. CTAP showed bibasilar infiltrate. Treated with antibiotic x 7 days. Subcutaneous air seen on chest x-rays 12/25. Chest CT 12/28 identified a few new prominent bilateral hilar lymph nodes, new enlargement of the right hilar lymph node inferior to the right main bronchus measuring 1.2 x 1.8 cm (likely reactive per Dr. Cloretta) and distended esophagus with reflux or retained fluid nearly to the thoracic inlet with retained mucoid debris in the trachea. Patient at high risk for aspiration secondary to esophageal fluid distention.  -NPO   History of a DVT s/p IVC filter, not on AC   Elevated LFTs. Bili 0.3 -> 0.4. Alk phos 103 -> 161. AST 20 -> 62. ALT 51 -> 81. CTAP 11/27/2024 showed a normal liver and gallbladder without biliary ductal dilatation.  Repeat CTAP 12/28 showed mild hepatic steatosis.      Principal Problem:   Hematemesis with nausea Active Problems:   History of DVT (deep vein thrombosis)   Diabetes mellitus without complication (HCC)   Gastric cancer (HCC)   Intractable nausea and vomiting   Partial small bowel obstruction (HCC)   Protein-calorie malnutrition, severe   Gastric outlet obstruction   Decreased hemoglobin   Subcutaneous emphysema   Fever, unspecified   Aspiration pneumonia due to gastric secretions (HCC)   Coffee ground emesis     LOS: 16 days   Colleen M Kennedy-Smith  12/14/2024, 1:08 PM   Attending physician's note   I have taken a history, reviewed the chart, and examined the patient. I performed a substantive portion of this encounter, including complete  performance of at least one of the key components, in conjunction with the APP. I agree with the APP's note, impression, and recommendations with my edits.   Still with episodic nausea and 2 episodes of hematemesis overnight, but H/H  remains stable at 8.8/27.  He did have 2 popsicles and reports he felt good to have just some oral intake.  Otherwise, remains hemodynamically stable, afebrile.  Discussed his difficult clinical situation with patient along with daughter at bedside today.  We discussed potential sources for his hematemesis to include tumor bleeding, erosive esophagitis, or even a Mallory-Weiss tear.  While the typical approach would be endoscopy for diagnostic and potentially therapeutic intent, the CT findings of pneumomediastinum are certainly concerning.  Discussed that if that retroperitoneal air tracking is from malignant microperforation, endoscopy could provoke the perforation and could lead to his demise.  Additionally, discussed the limited role of endoscopy with malignant bleeding.  This is mainly limited to temporizing measures such as Hemospray.  After careful consideration and shared decision making, feel that the risks would outweigh any potential benefits of endoscopy at this juncture.  With regards to the emphysematous cystitis, likely related to his immunocompromised state, possibly gas producing infection.  Currently being covered with broad-spectrum antibiotics.  Curb sided on-call Urology service and agrees with antibiotics and no role for catheterization or other instrumentation.  - Continue TPN - Continue PPI - Will give sucralfate  - Continue antiemetics - Could consider UGI series to evaluate for downstream obstruction from carcinomatosis, but not sure how well he would tolerate contrast from a volume and aspiration standpoint - Popsicles, ice chips, etc. as tolerated - Family meeting scheduled for tomorrow - Plan for cisplatin  again tomorrow - GI service will continue checking in on him intermittently, but please do not hesitate to reach out if any acute changes  Journi Moffa, DO, FACG [phone] office   A total of 50 minutes of time was spent on this encounter, including in depth  chart review, independent review of results as outlined above, communicating results with the patient directly, face-to-face time with the patient, coordinating care, and ordering studies and medications as appropriate, and documentation.          "

## 2024-12-14 NOTE — Progress Notes (Signed)
" °   12/14/24 1200  Spiritual Encounters  Type of Visit Initial  Care provided to: Pt and family  Conversation partners present during encounter Nurse  Referral source Nurse (RN/NT/LPN);Physician  Reason for visit Urgent spiritual support  OnCall Visit No  Spiritual Framework  Presenting Themes Meaning/purpose/sources of inspiration;Goals in life/care;Values and beliefs;Significant life change;Impactful experiences and emotions;Courage hope and growth;Rituals and practive;Community and relationships  Community/Connection Family  Needs/Challenges/Barriers  (Anticipatory Grief from family.  Children aged 68, 20 ad 41)  Patient Stress Factors Exhausted;Loss of control;Major life changes  Family Stress Factors Lack of knowledge;Loss;Loss of control;Major life changes  Interventions  Spiritual Care Interventions Made Established relationship of care and support;Compassionate presence;Reflective listening;Normalization of emotions;Explored values/beliefs/practices/strengths;Meaning making;Mindfulness intervention;Meditation;Prayer;Encouragement;Supported grief process  Spiritual Care Plan  Spiritual Care Issues Still Outstanding Chaplain will continue to follow   Chaplain met with patient this monring for eoitonal support of adult child who is anxious about telling younger sibling about father's disagnosis.  Met with family at bedside and supported them in their feelings.  Brought the patient's early years as a [place] who immigrated here 29 years ago, encouraging family to support patient for his spiritual and cultural background.  Provided care and emotional supoprt to patient and his daughter and sister who were present.  Spoke of the scheduled Family Meeting for 12/31 @ 12 Noon .  Helped relieve anxiety of daughter for her concerns.  Prayed with family at their request.  Followed up with Palliative Team member.   "

## 2024-12-14 NOTE — Plan of Care (Signed)
  Problem: Clinical Measurements: Goal: Ability to maintain clinical measurements within normal limits will improve Outcome: Progressing   Problem: Clinical Measurements: Goal: Diagnostic test results will improve Outcome: Progressing   Problem: Clinical Measurements: Goal: Cardiovascular complication will be avoided Outcome: Progressing   

## 2024-12-14 NOTE — Progress Notes (Signed)
 " PROGRESS NOTE  Henry May FMW:[id number] DOB: May 05, 1966   PCP: [name], DO  Patient is from: Home.  Lives with wife and daughter.  DOA: 11/27/2024 LOS: 16  Chief complaints No chief complaint on file.    Brief Narrative / Interim history: 58 year old M with PMH of DVT with IVC filter, gastric cancer on chemo and esophagitis presented to [facility] due to nausea, vomiting and coffee-ground emesis 2 days after he started new chemotherapy, and admitted for intractable nausea and vomiting, dilated small bowel loops and coffee-ground emesis.  Recent EGD on 12/4 with LA grade D esophagitis without bleeding, and large infiltrative, sessile and ulcerated circumferential mass with contact of bleeding in gastric body and antrum with antral narrowing and luminal diameter of approximately 1.5 cm  In ED, stable vitals.  CBC and CMP without significant finding.  Lipase 171.  CT abdomen and pelvis showed dilated proximal small bowel suggesting SBO but without discrete transition point, thick walled gastric body/antrum without definite focal mass, mild nodularity along the pylorus, scarring with superimposed patchy peribronchovascular nodularity in the bilateral lower lobes right more than left suggesting mild aspiration or pneumonia.  Patient was started on antiemetics, IV fluid, PPI and admitted for further care.  Oncology, surgery, GI and palliative medicine consulted.  Patient underwent EGD and gastric stent placement on 12/16.  Was unable to tolerate diet.  Started on TPN.  Plan was for discharge with TPN once logistic in place but nausea and vomiting gotten worse after stopping octreotide  and Decadron .  Also having hemoptysis with drop in hemoglobin.  Restarted on octreotide  infusion.    GI reengaged and order CT chest, abdomen and pelvis that showed new patchy substernal pneumomediastinum of indeterminate source, patchy consolidation in lower lobes and posterior LUL, esophageal distention  with retained/reflux fluid in thoracic inlet, emphysematous cystitis and circumferential gastric thickening with adjacent stranding change, antropyloric stenting and continued duodenal dilation, thickening jejunal folds.     Subjective: Seen and examined earlier this morning.  No major events overnight or this morning.  Reports small emesis this morning.  No other complaints.  Patient's daughter at bedside talking to palliative.  Assessment and plan: Gastric cancer, gastric luminal narrowing and esophagitis: Intractable nausea and vomiting: Likely due to the above. Acute blood loss anemia due to hematemesis-likely due to bleeding from gastric cancer. -Initial CT with dilated small bowel without transition point.  -S/p EGD and gastric stenting in 12/16. -Repeat CTA chest, abdomen and pelvis as above. -Hemoglobin stable after 2 units on 12/28. -Resumed IV Unasyn  on 12/27>>>. -Remains n.p.o. except ice chips and sips with meds. -Continue TPN per pharmacy.  -Continue IV Phenergan , Zofran  and Ativan . -Resumed octreotide  infusion on 12/25.  Will continue. -Continue IV Protonix  40 mg twice daily -Aspiration precaution -Continue monitoring CBC. -GI, oncology and palliative following.  Gastric cancer: Recently restarted on chemo.  Followed by Dr. Zorita.  -S/p EGD and gastric stenting on 12/16. -Received cisplatin  chemo on 12/23. -Repeat CT chest, abdomen and pelvis as above. -Oncology planning week #3 cisplatin  on 12/31. -GI, oncology and palliative medicine on board. -Plan for family meeting on 12/31.  Aspiration pneumonitis/pneumonia: Initial CXR showed patchy airspace opacities in RML.  Initial CT CT abdomen and pelvis with bibasilar infiltrate. Completed 7 days of appropriate antibiotics.  Spiked fever on 12/27. Repeat chest x-ray with stable right lung opacities/infiltrate.  Repeat CT chest, abdomen and pelvis as above. -IV Unasyn  resumed on 12/27>> -Aspiration precaution -Wean  oxygen  as able  Pneumomediastinum:  Noted on CT chest.  Unclear source.  Could be from retching and vomiting. -Monitor   Diet controlled diabetes with hyperglycemia: A1c 5.7%.  Not on medication at home.  Hyperglycemia likely due to octreotide  -TPN per pharmacy.  Can add insulin  to TPN if needed -Continue SSI sensitive.   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  Improved.  Insomnia mainly due to intractable nausea and vomiting - IV Ativan  and Phenergan  as above.  Lightheadedness/presyncope: Likely due to intractable nausea, vomiting and hematemesis. - IV fluid as above  Severe malnutrition Body mass index is 22.1 kg/m. Nutrition Problem: Severe Malnutrition Etiology: chronic illness (gastric cancer) Signs/Symptoms: severe fat depletion, severe muscle depletion, percent weight loss (24% weight loss in less than 3 months) Percent weight loss: 24 % (in < 3 months) Interventions: Refer to RD note for recommendations   DVT prophylaxis:  SCDs Start: 11/28/24 1042  Code Status: Full code Family Communication: Updated patient's daughter at bedside. Level of care: Progressive Status is: Inpatient Remains inpatient appropriate because: Intractable nausea and vomiting, hematemesis and gastric cancer   Final disposition: To be determined.   55 minutes with more than 50% spent in reviewing records, counseling patient/family and coordinating care.  Consultants:  Gastroenterology General Surgery Oncology Palliative medicine  Procedures: 12/16-EGD and gastric stenting  Microbiology summarized: None  Objective: Vitals:   12/13/24 1204 12/13/24 1953 12/14/24 0500 12/14/24 0508  BP: 125/78 120/78  127/76  Pulse: 79 73  92  Resp: 18 20    Temp: 98 F (36.7 C) 98 F (36.7 C)  99.6 F (37.6 C)  TempSrc: Oral Oral  Oral  SpO2: 94% 98%  97%  Weight:   73.9 kg   Height:        Examination:  GENERAL: No apparent  distress.  Appears frail. HEENT: MMM.  Vision and hearing grossly intact.  NECK: Supple.  No apparent JVD.  RESP:  No IWOB.  Fair aeration bilaterally. CVS:  RRR. Heart sounds normal.  ABD/GI/GU: BS+. Abd soft, NTND.  MSK/EXT:  Moves extremities. No apparent deformity. No edema.  SKIN: no apparent skin lesion or wound NEURO: AA.  Oriented appropriately.  No apparent focal neuro deficit. PSYCH: Calm. Normal affect.   Sch Meds:  Scheduled Meds:  Chlorhexidine  Gluconate Cloth  6 each Topical Daily   insulin  aspart  0-15 Units Subcutaneous 4 times per day   mouth rinse  15 mL Mouth Rinse 4 times per day   pantoprazole  (PROTONIX ) IV  40 mg Intravenous Q12H   phenylephrine       Continuous Infusions:  ampicillin -sulbactam (UNASYN ) IV 3 g (12/14/24 1018)   octreotide  (SANDOSTATIN ) 500 mcg in sodium chloride  0.9 % 250 mL (2 mcg/mL) infusion 50 mcg/hr (12/13/24 2216)   promethazine  (PHENERGAN ) injection (IM or IVPB) 25 mg (12/14/24 0850)   TPN CYCLIC-ADULT (ION) 64 mL/hr at 12/14/24 1019   TPN CYCLIC-ADULT (ION)     PRN Meds:.acetaminophen  **OR** acetaminophen , alum & mag hydroxide-simeth, guaiFENesin -dextromethorphan , labetalol , LORazepam , menthol , mouth rinse, phenol, phenylephrine, promethazine  (PHENERGAN ) injection (IM or IVPB), sodium chloride  flush  Antimicrobials: Anti-infectives (From admission, onward)    Start     Dose/Rate Route Frequency Ordered Stop   12/11/24 0900  Ampicillin -Sulbactam (UNASYN ) 3 g in sodium chloride  0.9 % 100 mL IVPB        3 g 200 mL/hr over 30 Minutes Intravenous Every 6 hours 12/11/24 0817     11/28/24 1200  Ampicillin -Sulbactam (  UNASYN ) 3 g in sodium chloride  0.9 % 100 mL IVPB  Status:  Discontinued        3 g 200 mL/hr over 30 Minutes Intravenous Every 6 hours 11/28/24 1048 12/05/24 0909   11/28/24 0045  Ampicillin -Sulbactam (UNASYN ) 3 g in sodium chloride  0.9 % 100 mL IVPB        3 g 200 mL/hr over 30 Minutes Intravenous  Once 11/28/24 0042  11/28/24 0134        I have personally reviewed the following labs and images: CBC: Recent Labs  Lab 12/10/24 0908 12/11/24 0337 12/12/24 0752 12/13/24 0304 12/13/24 0819 12/14/24 0408  WBC 5.3 5.2 6.6 4.7  --  5.1  NEUTROABS 4.3 4.6  --   --  3.5 3.9  HGB 10.4* 8.3* 6.7* 8.3*  --  8.8*  HCT 31.3* 25.8* 20.7* 25.5*  --  26.9*  MCV 91.3 94.9 95.0 92.7  --  93.7  PLT 292 251 227 196  --  210   BMP &GFR Recent Labs  Lab 12/10/24 0342 12/11/24 0337 12/12/24 0446 12/13/24 0304 12/14/24 0408  NA 138 141 140 144 138  K 4.4 3.8 4.0 4.0 4.3  CL 104 106 105 109 103  CO2 24 26 28 29 24   GLUCOSE 250* 270* 259* 154* 155*  BUN 37* 35* 33* 29* 26*  CREATININE 0.73 0.75 0.69 0.60* 0.70  CALCIUM  8.6* 8.0* 8.1* 8.0* 8.1*  MG 2.1 1.7 2.0 2.0 2.1  PHOS 2.2* 1.9* 3.1 3.7 3.1   Estimated Creatinine Clearance: 102.6 mL/min (by C-G formula based on SCr of 0.7 mg/dL). Liver & Pancreas: Recent Labs  Lab 12/10/24 0342 12/11/24 0337 12/12/24 0446 12/13/24 0304 12/14/24 0408  AST 24 17 14* 20 62*  ALT 83* 44 32 51* 81*  ALKPHOS 154* 110 94 103 161*  BILITOT 0.4 0.5 0.3 0.3 0.4  PROT 5.8* 5.1* 5.0* 5.1* 5.3*  ALBUMIN  2.8* 2.5* 2.3* 2.4* 2.5*   No results for input(s): LIPASE, AMYLASE in the last 168 hours.  No results for input(s): AMMONIA in the last 168 hours. Diabetic: No results for input(s): HGBA1C in the last 72 hours.  Recent Labs  Lab 12/13/24 1954 12/13/24 2348 12/14/24 0350 12/14/24 0811 12/14/24 1148  GLUCAP 139* 130* 151* 142* 104*   Cardiac Enzymes: No results for input(s): CKTOTAL, CKMB, CKMBINDEX, TROPONINI in the last 168 hours. Recent Labs    12/11/24 1208  PROBNP 244.0   Coagulation Profile: Recent Labs  Lab 12/11/24 0337  INR 1.1    Thyroid  Function Tests: No results for input(s): TSH, T4TOTAL, FREET4, T3FREE, THYROIDAB in the last 72 hours. Lipid Profile: Recent Labs    12/13/24 0304  TRIG 132    Anemia  Panel: No results for input(s): VITAMINB12, FOLATE, FERRITIN, TIBC, IRON, RETICCTPCT in the last 72 hours. Urine analysis:    Component Value Date/Time   COLORURINE YELLOW 11/04/2024 1812   APPEARANCEUR CLEAR 11/04/2024 1812   LABSPEC 1.025 11/04/2024 1812   PHURINE 5.5 11/04/2024 1812   GLUCOSEU NEGATIVE 11/04/2024 1812   HGBUR NEGATIVE 11/04/2024 1812   BILIRUBINUR NEGATIVE 11/04/2024 1812   KETONESUR 15 (A) 11/04/2024 1812   PROTEINUR TRACE (A) 11/04/2024 1812   NITRITE NEGATIVE 11/04/2024 1812   LEUKOCYTESUR NEGATIVE 11/04/2024 1812   Sepsis Labs: Invalid input(s): PROCALCITONIN, LACTICIDVEN  Microbiology: No results found for this or any previous visit (from the past 240 hours).  Radiology Studies: No results found.       Versie Fleener T. Raysean Graumann [facility]  If 7PM-7AM, please contact night-coverage [email or web address] 12/14/2024, 11:51 AM   "

## 2024-12-14 NOTE — Progress Notes (Signed)
 "  Palliative Medicine Inpatient Follow Up Note HPI: 58 y.o. male with past medical history of gastric cancer (new chemotherapy 12/12), DVT with IVC filter admitted on 11/27/2024 with nausea and vomiting with coffee ground emesis. Has had a prolonged hospitalization with complications - SQ air on chest xrays. Palliative care is supporting additional goals of care conversations.   Today's Discussion 12/14/2024  *Please note that this is a verbal dictation therefore any spelling or grammatical errors are due to the [employer] One system interpretation.  I reviewed the chart notes including nursing notes from today, progress notes from today. I also reviewed vital signs, nursing flowsheets, medication administrations record, labs, and imaging.    I met with Bralyn at bedside this morning. He was joined by his daughter, Cherene. We reviewed that he remains to feel pretty nauseated overall. He generally feels poorly. He and I reviewed the plan for a meeting tomorrow. His hope is that during this meeting information can be relayed openly and honestly.   I met with patients daughter, Cherene. She and I discussed the difficulty of the current situation at hand. We reviewed that Sully is very ill at this time. The decision by Winn has been to pursue further treatments. We discussed the Dr. Cloretta has offered that chemotherapy will be provided likely tomorrow if patient is stable.   We discussed the difficulty from the perspective of Nyzaiah having a young child. We reviewed the kids path program as a recourse as well as speaking to the [employer] guidance counselor. I shared in addition I will reach out to MSW team at the cancer center to see if they have additional recommendations.  At this point in time the plan will be to pursue continued treatment. Patient and his daughter do share awareness regarding severity of illness.   We plan to have a family meeting tomorrow for further conversations  regarding goals moving forward.   Questions and concerns addressed/Palliative Support Provided.   Objective Assessment: Vital Signs Vitals:   12/13/24 1953 12/14/24 0508  BP: 120/78 127/76  Pulse: 73 92  Resp: 20   Temp: 98 F (36.7 C) 99.6 F (37.6 C)  SpO2: 98% 97%    Intake/Output Summary (Last 24 hours) at 12/14/2024 1124 Last data filed at 12/14/2024 0300 Gross per 24 hour  Intake 1691.13 ml  Output 400 ml  Net 1291.13 ml   Last Weight  Most recent update: 12/14/2024  5:02 AM    Weight  73.9 kg (162 lb 14.7 oz)            Gen:  Middle aged Hispanic M chronically ill appearing HEENT: Dry mucous membranes CV: Regular rate and rhythm  PULM: On RA, breathing is even and nonlabored  EXT: No edema  Neuro: Alert and oriented x3   SUMMARY OF RECOMMENDATIONS   Full Code / Full scope of care  Plan for family meeting on Wednesday at noon --> Have coordinated with interpretor, Mliss  Discussed concerns associated with patients disease --> patient and his wife share a strong faith and desire to fight  Appreciate support of Chaplain team  Support family getting additional resources for  patients 58 year old --> Have reached out to patients MSW [facility]; Provided information on Kids Path; Discussed speaking to childs guidance counselor  Ongoing PMT support ______________________________________________________________________________________ Rosaline Becton [facility] Palliative Medicine Team Team Cell Phone: [phone] Please utilize secure chat with additional questions, if there is no response within 30 minutes please call  the above phone number  Time Spent: 66  Palliative Medicine Team providers are available by phone from 7am to 7pm daily and can be reached through the team cell phone.  Should this patient require assistance outside of these hours, please call the patient's attending physician.     "

## 2024-12-15 ENCOUNTER — Encounter: Payer: Self-pay | Admitting: *Deleted

## 2024-12-15 ENCOUNTER — Inpatient Hospital Stay

## 2024-12-15 DIAGNOSIS — R112 Nausea with vomiting, unspecified: Secondary | ICD-10-CM | POA: Diagnosis not present

## 2024-12-15 DIAGNOSIS — K920 Hematemesis: Secondary | ICD-10-CM | POA: Diagnosis not present

## 2024-12-15 DIAGNOSIS — Z86718 Personal history of other venous thrombosis and embolism: Secondary | ICD-10-CM | POA: Diagnosis not present

## 2024-12-15 DIAGNOSIS — E119 Type 2 diabetes mellitus without complications: Secondary | ICD-10-CM | POA: Diagnosis not present

## 2024-12-15 DIAGNOSIS — Z7189 Other specified counseling: Secondary | ICD-10-CM | POA: Diagnosis not present

## 2024-12-15 DIAGNOSIS — K56609 Unspecified intestinal obstruction, unspecified as to partial versus complete obstruction: Secondary | ICD-10-CM | POA: Diagnosis not present

## 2024-12-15 DIAGNOSIS — J690 Pneumonitis due to inhalation of food and vomit: Secondary | ICD-10-CM | POA: Diagnosis not present

## 2024-12-15 DIAGNOSIS — Z515 Encounter for palliative care: Secondary | ICD-10-CM | POA: Diagnosis not present

## 2024-12-15 DIAGNOSIS — C169 Malignant neoplasm of stomach, unspecified: Secondary | ICD-10-CM | POA: Diagnosis not present

## 2024-12-15 DIAGNOSIS — E43 Unspecified severe protein-calorie malnutrition: Secondary | ICD-10-CM | POA: Diagnosis not present

## 2024-12-15 DIAGNOSIS — Z711 Person with feared health complaint in whom no diagnosis is made: Secondary | ICD-10-CM | POA: Diagnosis not present

## 2024-12-15 DIAGNOSIS — C163 Malignant neoplasm of pyloric antrum: Secondary | ICD-10-CM | POA: Diagnosis not present

## 2024-12-15 LAB — COMPREHENSIVE METABOLIC PANEL WITH GFR
ALT: 65 U/L — ABNORMAL HIGH (ref 0–44)
AST: 34 U/L (ref 15–41)
Albumin: 2.4 g/dL — ABNORMAL LOW (ref 3.5–5.0)
Alkaline Phosphatase: 147 U/L — ABNORMAL HIGH (ref 38–126)
Anion gap: 8 (ref 5–15)
BUN: 28 mg/dL — ABNORMAL HIGH (ref 6–20)
CO2: 26 mmol/L (ref 22–32)
Calcium: 8 mg/dL — ABNORMAL LOW (ref 8.9–10.3)
Chloride: 102 mmol/L (ref 98–111)
Creatinine, Ser: 0.66 mg/dL (ref 0.61–1.24)
GFR, Estimated: >60 mL/min
Glucose, Bld: 161 mg/dL — ABNORMAL HIGH (ref 70–99)
Potassium: 4.3 mmol/L (ref 3.5–5.1)
Sodium: 137 mmol/L (ref 135–145)
Total Bilirubin: 0.6 mg/dL (ref 0.0–1.2)
Total Protein: 5.4 g/dL — ABNORMAL LOW (ref 6.5–8.1)

## 2024-12-15 LAB — CBC
HCT: 23.2 % — ABNORMAL LOW (ref 39.0–52.0)
Hemoglobin: 7.5 g/dL — ABNORMAL LOW (ref 13.0–17.0)
MCH: 30.5 pg (ref 26.0–34.0)
MCHC: 32.3 g/dL (ref 30.0–36.0)
MCV: 94.3 fL (ref 80.0–100.0)
Platelets: 209 K/uL (ref 150–400)
RBC: 2.46 MIL/uL — ABNORMAL LOW (ref 4.22–5.81)
RDW: 14.6 % (ref 11.5–15.5)
WBC: 4.5 K/uL (ref 4.0–10.5)
nRBC: 0.4 % — ABNORMAL HIGH (ref 0.0–0.2)

## 2024-12-15 LAB — DIFFERENTIAL
Abs Immature Granulocytes: 0.22 K/uL — ABNORMAL HIGH (ref 0.00–0.07)
Basophils Absolute: 0 K/uL (ref 0.0–0.1)
Basophils Relative: 1 %
Eosinophils Absolute: 0.1 K/uL (ref 0.0–0.5)
Eosinophils Relative: 1 %
Immature Granulocytes: 5 %
Lymphocytes Relative: 17 %
Lymphs Abs: 0.8 K/uL (ref 0.7–4.0)
Monocytes Absolute: 0.2 K/uL (ref 0.1–1.0)
Monocytes Relative: 3 %
Neutro Abs: 3.3 K/uL (ref 1.7–7.7)
Neutrophils Relative %: 73 %
Smear Review: NORMAL

## 2024-12-15 LAB — PHOSPHORUS: Phosphorus: 3 mg/dL (ref 2.5–4.6)

## 2024-12-15 LAB — GLUCOSE, CAPILLARY
Glucose-Capillary: 145 mg/dL — ABNORMAL HIGH (ref 70–99)
Glucose-Capillary: 110 mg/dL — ABNORMAL HIGH (ref 70–99)
Glucose-Capillary: 113 mg/dL — ABNORMAL HIGH (ref 70–99)
Glucose-Capillary: 224 mg/dL — ABNORMAL HIGH (ref 70–99)

## 2024-12-15 MED ORDER — PROCHLORPERAZINE EDISYLATE 10 MG/2ML IJ SOLN
10.0000 mg | Freq: Four times a day (QID) | INTRAMUSCULAR | Status: DC
  Administered 2024-12-15 – 2024-12-16 (×3): 10 mg via INTRAVENOUS
  Filled 2024-12-15 (×3): qty 2

## 2024-12-15 MED ORDER — SODIUM CHLORIDE 0.9 % IV SOLN
150.0000 mg | Freq: Once | INTRAVENOUS | Status: AC
  Administered 2024-12-15: 150 mg via INTRAVENOUS
  Filled 2024-12-15: qty 5

## 2024-12-15 MED ORDER — MAGNESIUM SULFATE 2 GM/50ML IV SOLN
2.0000 g | Freq: Once | INTRAVENOUS | Status: AC
  Administered 2024-12-15: 2 g via INTRAVENOUS
  Filled 2024-12-15: qty 50

## 2024-12-15 MED ORDER — TRAVASOL 10 % IV SOLN
INTRAVENOUS | Status: AC
  Filled 2024-12-15: qty 1080

## 2024-12-15 MED ORDER — DEXAMETHASONE SOD PHOSPHATE PF 10 MG/ML IJ SOLN
10.0000 mg | Freq: Once | INTRAMUSCULAR | Status: AC
  Administered 2024-12-15: 10 mg via INTRAVENOUS

## 2024-12-15 MED ORDER — PALONOSETRON HCL INJECTION 0.25 MG/5ML
0.2500 mg | Freq: Once | INTRAVENOUS | Status: AC
  Administered 2024-12-15: 0.25 mg via INTRAVENOUS
  Filled 2024-12-15: qty 5

## 2024-12-15 MED ORDER — POTASSIUM CHLORIDE IN NACL 20-0.9 MEQ/L-% IV SOLN
Freq: Once | INTRAVENOUS | Status: AC
  Filled 2024-12-15: qty 1000

## 2024-12-15 MED ORDER — ONDANSETRON HCL 4 MG/2ML IJ SOLN: 4.0000 mg | Freq: Four times a day (QID) | INTRAMUSCULAR | Status: DC

## 2024-12-15 MED ORDER — SODIUM CHLORIDE 0.9 % IV SOLN
35.0000 mg/m2 | Freq: Once | INTRAVENOUS | Status: AC
  Administered 2024-12-15: 66 mg via INTRAVENOUS
  Filled 2024-12-15: qty 66

## 2024-12-15 NOTE — Progress Notes (Signed)
 Chemo RN presented to patient room. Patient verified using two separate identifiers. Role of Chemo RN explained to patient and family. Consent for administration of cisplatin  already obtained this admission. Port in use for TPN, so 24G PIV started in right arm. Pre-hydration and pre-medications administered. Cisplatin  infusion delayed due to family meeting with palliative care team, but completed without issue. Post-hydration started, Primary RN to discontinue when finished.

## 2024-12-15 NOTE — Progress Notes (Signed)
 Faxed notification from [facility] that order for testing received and are working on obtaining specimen.

## 2024-12-15 NOTE — Plan of Care (Signed)
   Problem: Health Behavior/Discharge Planning: Goal: Ability to manage health-related needs will improve Outcome: Progressing   Problem: Clinical Measurements: Goal: Will remain free from infection Outcome: Progressing   Problem: Clinical Measurements: Goal: Respiratory complications will improve Outcome: Progressing

## 2024-12-15 NOTE — Progress Notes (Signed)
 [facility] CSW Progress Note  Clinical Child Psychotherapist present for in patient palliative meeting.     Lizbeth Sprague, LCSW Clinical Social Worker [facility]

## 2024-12-15 NOTE — Progress Notes (Signed)
 [facility] testing ordered on [phone]

## 2024-12-15 NOTE — Progress Notes (Addendum)
 "   Progress Note   LOS: 17 days   Chief Complaint: Gastric cancer/hematemesis   Subjective   Family and nurse at bedside.  Patient had 2 bowel movements this morning that were soft, brown, without blood.  No hematemesis since yesterday.  Not having any nausea, abdominal pain, vomiting.  Tolerating popsicles without difficulty.  Family meeting planned for later today    Objective   Vital signs in last 24 hours: Temp:  [97.7 F (36.5 C)-98.6 F (37 C)] 98.3 F (36.8 C) (12/31 0609) Pulse Rate:  [66-80] 78 (12/31 0609) Resp:  [18-24] 20 (12/31 0609) BP: (96-120)/(56-64) 120/64 (12/31 0609) SpO2:  [96 %-98 %] 98 % (12/31 0609) Weight:  [76.9 kg] 76.9 kg (12/31 0500) Last BM Date : 12/11/24 Last BM recorded by nurses in past 5 days Stool Type: Type 6 (Mushy consistency with ragged edges) (12/11/2024  4:18 PM)  General:   male in no acute distress, ill-appearing Heart:  Regular rate and rhythm; no murmurs Pulm: Clear anteriorly; no wheezing Abdomen: soft, nondistended, normal bowel sounds in all quadrants. Nontender without guarding. No organomegaly appreciated. Extremities:  No edema Neurologic:  Alert and  oriented x4;  No focal deficits.  Psych:  Cooperative. Normal mood and affect.  Intake/Output from previous day: 12/30 0701 - 12/31 0700 In: 2260.4 [I.V.:1619; IV Piggyback:641.4] Out: 800 [Emesis/NG output:800] Intake/Output this shift: No intake/output data recorded.  Studies/Results: No results found.  Lab Results: Recent Labs    12/13/24 0304 12/14/24 0408 12/15/24 0420  WBC 4.7 5.1 4.5  HGB 8.3* 8.8* 7.5*  HCT 25.5* 26.9* 23.2*  PLT 196 210 209   BMET Recent Labs    12/13/24 0304 12/14/24 0408 12/15/24 0420  NA 144 138 137  K 4.0 4.3 4.3  CL 109 103 102  CO2 29 24 26   GLUCOSE 154* 155* 161*  BUN 29* 26* 28*  CREATININE 0.60* 0.70 0.66  CALCIUM  8.0* 8.1* 8.0*   LFT Recent Labs    12/15/24 0420  PROT 5.4*  ALBUMIN  2.4*  AST 34  ALT 65*   ALKPHOS 147*  BILITOT 0.6   PT/INR No results for input(s): LABPROT, INR in the last 72 hours.   Scheduled Meds:  Chlorhexidine  Gluconate Cloth  6 each Topical Daily   CISplatin   35 mg/m2 (Treatment Plan Recorded) Intravenous Once   dexamethasone  (DECADRON ) IVPB ([facility])  10 mg Intravenous Once   insulin  aspart  0-15 Units Subcutaneous 4 times per day   mouth rinse  15 mL Mouth Rinse 4 times per day   palonosetron   0.25 mg Intravenous Once   pantoprazole  (PROTONIX ) IV  40 mg Intravenous Q12H   sucralfate   1 g Oral TID   Continuous Infusions:  ampicillin -sulbactam (UNASYN ) IV 3 g (12/15/24 0928)   fosaprepitant  (EMEND) 150 mg in sodium chloride  0.9 % 145 mL IVPB     magnesium  sulfate IV Infusion Orderable Oncology 2 g (12/15/24 0925)   octreotide  (SANDOSTATIN ) 500 mcg in sodium chloride  0.9 % 250 mL (2 mcg/mL) infusion 50 mcg/hr (12/15/24 0909)   promethazine  (PHENERGAN ) injection (IM or IVPB) 25 mg (12/15/24 0413)   TPN CYCLIC-ADULT (ION) 64 mL/hr at 12/15/24 0744   TPN CYCLIC-ADULT (ION)        Patient profile:   Henry May is a 58 year old male with a past medical history of 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 Admitted 11/27/2024 with intractable nausea/vomiting/coffee-ground  emesis with stable hemoglobin (Hb 14.5 - baseline).    CTAP 12/13 showed thick walled gastric body and antrum without a definitive mass with nodularity along the pylorus and proximal dilated small bowel loops, questionable mild obstruction without a discrete transition point. S/P EGD with gastric stent placement 12/16. On TPN. Patient developed hematemesis 12/25 +/- hemoptysis. Octreotide  restarted. Repeat CTAP 12/28 showed circumferential gastric thickening with antropyloric stent with continued dilatation in the duodenum 6 cm caliber without an appreciable transition, thickened jejunal  folds consistent with nonspecific enteritis and small volume of ascites.  Palliative care consulted, patient wishes to continue treatment for cancer.    Impression/plan:   Stage IV gastric adenocarcinoma Hematemesis Potential sources include tumor bleeding, erosive esophagitis, Mallory-Weiss tear.  Endoscopy very high risk with CT findings of pneumomediastinum and extensive discussion with Dr. San that if retroperitoneal air tracking is from malignant microperforation endoscopy risks outweigh benefits. - Limited role of endoscopy with possible malignant bleeding, risks outweigh benefits at this time - Continue TPN - Continue PPI - Continue Carafate  - Continue antiemetics - Consider UGI series to evaluate for downstream obstruction from carcinomatosis - Continue popsicles and ice chips as tolerated - Family meeting scheduled today - Receiving cisplatin  today  Normocytic anemia. Secondary to gastric adenocarcinoma and hematemesis.  S/p transfusion 2 units PRBCs 12/28 Hgb 7.5 (down from 8.8 yesterday) - Transfuse for Hg level < 7 or as needed if symptomatic    Possible aspiration pneumonitis/pneumonia. CXR showed patchy airspace opacities in RML. CTAP showed bibasilar infiltrate. Treated with antibiotic x 7 days. Subcutaneous air seen on chest x-rays 12/25. Chest CT 12/28 identified a few new prominent bilateral hilar lymph nodes, new enlargement of the right hilar lymph node inferior to the right main bronchus measuring 1.2 x 1.8 cm (likely reactive per Dr. Cloretta) and distended esophagus with reflux or retained fluid nearly to the thoracic inlet with retained mucoid debris in the trachea. Patient at high risk for aspiration secondary to esophageal fluid distention.  -NPO   History of a DVT s/p IVC filter, not on AC   Elevated LFTs, likely secondary to hepatic steatosis AST 30/ALT 65/alk phos 147 CTAP 12/28 with mild hepatic steatosis   Bayley CHRISTELLA Blower  12/15/2024, 10:10  AM    Attending physician's note   I have taken a history, reviewed the chart, and examined the patient. I performed a substantive portion of this encounter, including complete performance of at least one of the key components, in conjunction with the APP. I agree with the APP's note, impression, and recommendations with my edits.   Tolerating popsicles.  Had 2 large-volume, nonbloody stools earlier today.  Slight downtrend in hemoglobin to 7.5 today, but no nausea/vomiting today and reports minimal blood in emesis yesterday.  Hopefully the blood-streaked emesis represents either Mallory-Weiss tear or 2/2 erosive esophagitis and will continue to heal with time and medical therapy.  Otherwise, again discussed the elevated risks of endoscopy given recent CT findings. - Continue high-dose PPI - Continue sucralfate  - Try sips of water , along with ice chips and popsicles.  Depending on how he is doing, can possibly have some more clear liquids in the coming days (however aspiration risks discussed with patient again today) - Receiving cisplatin  - Continue daily CBC checks with blood products as needed per protocol - Continue TPN, IV fluids - ABX for aspiration pneumonia - Inpatient GI service will follow intermittently through the holiday and weekend  [facility], DO, FACG [phone] office           "

## 2024-12-15 NOTE — Progress Notes (Signed)
 "  Palliative Medicine Inpatient Follow Up Note HPI: 58 y.o. male with past medical history of gastric cancer (new chemotherapy 12/12), DVT with IVC filter admitted on 11/27/2024 with nausea and vomiting with coffee ground emesis. Has had a prolonged hospitalization with complications - SQ air on chest xrays. Palliative care is supporting additional goals of care conversations.   Today's Discussion 12/15/2024  *Please note that this is a verbal dictation therefore any spelling or grammatical errors are due to the Dragon Medical One system interpretation.  I reviewed the chart notes including nursing notes from today, progress notes from today. I also reviewed vital signs, nursing flowsheets, medication administrations record, labs, and imaging.    A family meeting was held this afternoon in the presence of patients spouse, daughter, and siblings on the phone. Providers present were myself, Dr. Sebastian, Mliss spanish translator, Chaplain, Avelina Birmingham, and MSW, [facility]. We discussed Kaison's current metastatic gastric cancer.  Dr. Sebastian gave a clinical update in terms of Kaelum's hematemesis. He shares that patient appears to have worsening of cancer given evidence of severe ileus vs. unrecognized mechanical obstruction from carcinomatosis. Symptoms such as nausea and vomiting & concern for microperforation causing subcutaneous emphysema discussed. Reviewed that overall based upon review of patients clinical information prognosis is poor. Offered support to family and answered all questions prior to exiting.   I was able to speak with Emmette and his family. I shared per my prior conversation with Dr. Cloretta and his expert opinion that prognosis I quite limited (months). We discussed how if this is the reality Shawnta who elect to choose this time. We reviewed the option of continued medical care and interventions, the potential burdens of treatment, and the possible effects these  may have on quality of life.  We reviewed should Oswell elect at any point to stop treatments that there is the options of comfort focus and hospice care. I described hospice as a service for patients who have a life expectancy of 6 months or less. The goal of hospice is the preservation of dignity and quality at the end phases of life.  Under hospice care, the focus changes from curative to symptom relief.  Arlen and his family shared understanding.  We reviewed cardiopulmonary resuscitation status and expectations should Emmauel go through such measures. I shared openly and honestly that the likelihood of recovery after such an event - if he survived would be poor.   We discussed Johanna's frustration with the medical system and the potential preventable nature of Kiano's disease had they been listened to when he started having symptoms of reflux. She also shared the difficulty of her father being misdiagnoses and dying. She shares the need for hope under the given circumstances.   We reviewed the pain that Biran's children are having. Patients family is wanting the medical team to speak more to his 58 year old son about Drako's disease and what to anticipate.   Plan for another meeting on Friday for further conversations related to goals of care, code status, and to help family navigate speaking with son.  Questions and concerns addressed/Palliative Support Provided.   Objective Assessment: Vital Signs Vitals:   12/14/24 2004 12/15/24 0609  BP: 110/62 120/64  Pulse: 75 78  Resp: 18 20  Temp: 98.6 F (37 C) 98.3 F (36.8 C)  SpO2: 96% 98%    Intake/Output Summary (Last 24 hours) at 12/15/2024 1345 Last data filed at 12/15/2024 1216 Gross per 24 hour  Intake 2978.78 ml  Output 1050 ml  Net 1928.78 ml   Last Weight  Most recent update: 12/15/2024  6:57 AM    Weight  76.9 kg (169 lb 8.5 oz)            Gen:  Middle aged Hispanic M chronically ill  appearing HEENT: Dry mucous membranes CV: Regular rate and rhythm  PULM: On RA, breathing is even and nonlabored  EXT: No edema  Neuro: Alert and oriented x3   SUMMARY OF RECOMMENDATIONS   Full Code / Full scope of care --> Have discussed DNAR/DNI given patients disease burden, he and his family plan to discuss this further  Family meeting held today (12/31) --> Family would like to convene again on Friday with an in person Engineer, Structural for further discussion of goals  Discussed concerns associated with patients disease and very poor prognosis --> patient and his wife share a strong faith and desire to fight  Appreciate support of Chaplain team  Support family getting additional resources for  patients 58 year old --> Have reached out to patients MSW [employer]; Provided information on [facility]; Discussed speaking to childs guidance counselor  Ongoing PMT support ________________________________  Symptoms: Nausea and Vomiting: - Received Aloxi  this morning (12/31 @ 1125) - Zofran  4mg  IV Q6H (Start on 1/3) - procholperazine 10mg  IVP Q6H PRN (Stop 1/3) - Phenergan  25mg  IV Q6H PRN - Repeat EKG --> last Qtc was 436 ______________________________________________________________________________________ Rosaline Becton [place] Palliative Medicine Team Team Cell Phone: [phone] Please utilize secure chat with additional questions, if there is no response within 30 minutes please call the above phone number  Time In: 11:47AM Time Out: 1303PM Total Time: 116 minutes  Palliative Medicine Team providers are available by phone from 7am to 7pm daily and can be reached through the team cell phone.  Should this patient require assistance outside of these hours, please call the patient's attending physician.     "

## 2024-12-15 NOTE — Progress Notes (Signed)
 PHARMACY - TOTAL PARENTERAL NUTRITION CONSULT NOTE   Indication: intolerance to enteral feeds  Patient Measurements: Height: 6' (182.9 cm) Weight: 76.9 kg (169 lb 8.5 oz) IBW/kg (Calculated) : 77.6 TPN AdjBW (KG): 70 Body mass index is 22.99 kg/m. Usual Weight:   Assessment: 58 yoM admitted on 12/13 with intractable nausea and vomiting, dilated small bowel loops, and coffee-ground emesis. 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  He underwent EGD 12/16 showing esophagitis,gastric stensosis,  stent placed, impaired peristalsis.  Trial NG tube clamping and CLD. Pharmacy is consulted to dose TPN on 12/20 for intolerance to enteral feeding.    Glucose / Insulin : No hx DM, no PTA meds. - Dexamethasone  2mg  IV q12h- dc'd 12/24 am  - Dexamethasone  10 mg IV given 12/23 as premed for chemo - Octreotide  infusion resumed 12/25 @50  mcg/hr (noted that Octreotide , especially at higher doses/frequencies, can cause glucose dysregulation). Discussed with MD about transitioning back to Octreotide  50mcg q8h, however, patient with hematemesis so continuing with Octreotide  infusion for now.  - On sSSI, has used 12units/24 hours -  CBGs  (goal <150): 87-161  (most within goal range) Electrolytes: All lytes WNL including CoCa 9.1 Renal: SCr < 1 (stable), BUN 28 (elevated but slightly improved) Hepatic:  AST/ALT trending down 34/65; alk phos trending down 147 - Trig 164 (12/23), 132 (12/29) - albumin  low at 2.4 Intake / Output - MIVF: none - I/O: +1460 mL/24 hrs - Emesis/NG: -800 mL - Urine output: not recorded - LBM: 12/27 GI Imaging: - 12/18 Abd xray: No bowel dilatation or evidence of obstruction. Retained gastric fluid. 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 Prosthesis placed. Duodenal dilation deformity  - 12/25 DG abd: nonobstructive bowel gas pattern - 12/27 DG abd: nonobstructive bowel gas pattern - 12/28 chest/abd CT: Esophageal distention.  Nonspecific Enteritis. New mildly enlarged right hilar lymph node and a few slightly prominent bilateral hilar lymph nodes. New small volume of ascites  GI Surgeries / Procedures:  - 12/16 EGD: esophagitis  GI meds:   - Protonix  40mg  IV  q12h >> - Octreotide  50 sq q8hrs >> DC'd 12/24; resumed 12/25 @50mcg /hr  - Decadron  2 gm IV q12hrs >> DC'd 12/24  Central access: Implanted Port (01/22/24) TPN start date: 12/20   Nutritional Goals: Goal TPN rate is 90 mL/hr and provides 108 g of protein and 2182 kcals per day)  RD Assessment: Estimated Needs Total Energy Estimated Needs: 2100-2450 kcals Total Protein Estimated Needs: 105-120 grams Total Fluid Estimated Needs: >/= 2.1L  Current Nutrition:  Diet: NPO, Cyclic TPN  Plan:  Now - TPN pending insurance approval for outpatient use - GOC discussions ongoing with family  At 1800: - Continue TPN to cycle over 18 hrs (glucose infusion rate 2.14 - 4.25 mg/kg/min) - Electrolytes in TPN:  Na 75 mEq/L - increased K 50 mEq/L Ca 5 mEq/L Mg 5 mEq/L Phos 20 mmol/L Cl:Ac  to 1:1 - Add standard MVI and trace elements to TPN - Reducing the TPN infusion duration to 18hrs did improve CBGs given the decrease in glucose infusion rate. Can likely transition back to 12hr cyclic TPN once the octreotide  infusion is discontinued. If patient continues to be hospitalized, on an Octreotide  infusion, and CBGs elevated, may need to consider reverting back to continuous TPN. CBGs improved yesterday and today >> continue sensitive SSI four times a day at 0300, 1300, 1600, 2000--> CBGs: 2 hours past cyclic TPN start + 1 hour past  cyclic TPN d/c + middle of TPN  infusion + while off TPN (total 4 CBGs/24 hour period) - Monitor TPN labs on Mon/Thurs and PRN   Lacinda Moats, PharmD Clinical Pharmacist  12/31/20257:31 AM

## 2024-12-15 NOTE — Progress Notes (Signed)
" °   12/15/24 1115  Spiritual Encounters  Type of Visit Initial  Care provided to: Family  Referral source Chaplain team  Reason for visit Urgent spiritual support  OnCall Visit No   Chaplain met with family members ahead of family meeting that was to take place with several members of the patient's medical team. I felt that it was important to establish a link with them prior to the meeting.  The family asked that the meeting be delayed because a key family member with not be able to be present at the meeting time. I reached out via secure chat but the meeting time could not be changed. Instead an additional meeting was scheduled.   I attended the family meeting. I met with the individual family members at the conclusion of the meeting for listening and support.   Carley Birmingham [facility]  [phone] "

## 2024-12-15 NOTE — Progress Notes (Signed)
 IP PROGRESS NOTE  Subjective:   Mr. Loux denies pain.  He had flatus yesterday.  He continues to have intermittent vomiting.  The emesis is mixed with blood.  His wife reports the volume of hematemesis has diminished.    Objective: Vital signs in last 24 hours: Blood pressure 120/64, pulse 78, temperature 98.3 F (36.8 C), temperature source Oral, resp. rate 20, height 6' (1.829 m), weight 169 lb 8.5 oz (76.9 kg), SpO2 98%.  Intake/Output from previous day: 12/30 0701 - 12/31 0700 In: 2260.4 [I.V.:1619; IV Piggyback:641.4] Out: 800 [Emesis/NG output:800]  Physical Exam:  HEENT: No thrush Lungs: Decreased breath sounds with inspiratory rhonchi at the lower posterior chest bilaterally, no respiratory distress Cardiac: Regular rate and rhythm Abdomen: Soft, nontender, no hepatomegaly Extremities: No leg edema   Portacath/PICC-without erythema  Lab Results: Recent Labs    12/14/24 0408 12/15/24 0420  WBC 5.1 4.5  HGB 8.8* 7.5*  HCT 26.9* 23.2*  PLT 210 209      BMET Recent Labs    12/14/24 0408 12/15/24 0420  NA 138 137  K 4.3 4.3  CL 103 102  CO2 24 26  GLUCOSE 155* 161*  BUN 26* 28*  CREATININE 0.70 0.66  CALCIUM  8.1* 8.0*    Lab Results  Component Value Date   CEA 3.57 01/15/2024      Medications: I have reviewed the patient's current medications.  Assessment/Plan: Gastric cancer 11/21/2023 EGD-gastric body with infiltrative looking lesion; biopsy shows involvement of a hypercellular lesion that suggests diffuse carcinoma by morphology CTs abdomen/pelvis 11/28/2023-diffuse fatty infiltration of the liver; gastric wall thickening; lymph nodes up to 6 mm in diameter near the stomach wall. 12/31/2023 upper endoscopy-large diffuse friable infiltrative polypoid and ulcerated circumferential mass found in the gastric body.  Scope was passed beyond the mass with normal antrum/pylorus.  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  Negative for HER2 (1+); mismatch repair protein IHC normal; PD-L1 CPS 0%; CLDN18 positive: 90% of tumor cells with 2+/3+ membrane staining PET scan 01/07/2024-circumferential hypermetabolic gastric mucosal thickening.  Ill-defined hypermetabolic lymph nodes in the gastrohepatic ligament.  Intense hypermetabolic activity associated with the right lobe of the thyroid  gland.  Small hypermetabolic right axillary node favored reactive. 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  Gastric wall thickening slightly improved. Cycle 7 oxaliplatin , zolbetuximab 05/05/2024, Fulphila  Cycle 8 zolbetuximab 05/20/2024, oxaliplatin  held due to neuropathy, no Fulphila  Cycle 9 oxaliplatin /zolbetuximab 06/03/2024, Fulphila  Cycle 10 oxaliplatin /zolbetuximab 06/17/2024, Fulphila  Cycle 11 oxaliplatin /zolbetuximab 06/30/2024, Fulphila , oxaliplatin  dose reduced Cycle 12 zolbetuximab 07/15/2024, oxaliplatin  held secondary to neuropathy Cycle 13 zolbetuximab 07/29/2024, oxaliplatin  held secondary to neuropathy Cycle 14 zolbetuximab 08/26/2024, oxaliplatin  held secondary to neuropathy 09/01/2024 CTs-mild distal gastric wall thickening without discrete mass.  No metastatic disease. Cycle 15 zolbetuximab 09/09/2024, oxaliplatin  held due to neuropathy Cycle 16 zolbetuximab 09/30/2024, oxaliplatin  held  due to neuropathy Cycle 17 zolbetuximab 10/21/2024, oxaliplatin  held due to neuropathy 11/03/2024 CT abdomen/pelvis: Diffuse mild wall thickening of the distal gastric body and antrum-stable, mild increase in a right gastric lymph node small volume  ascites without omental nodularity, proximal jejunal mural thickening 11/04/2024 upper GI: Narrowing of the gastric antrum, normal gastric emptying, dilated duodenum and proximal jejunum without evidence of a bowel obstruction 11/18/2024 upper endoscopy: Tumor in the gastric body and antrum with antral narrowing, dilated duodenum without evidence of mass or obstruction, esophagitis: Biopsy of the gastric mass-poorly differentiated adenocarcinoma with focal signet ring morphology, esophagus biopsy: Squamous mucosa with focal ulceration Cycle 1 weekly cisplatin  11/26/2024 Cycle 2 weekly cisplatin  12/07/2024 Cycle 3 weekly cisplatin  12/15/2024 12/12/2024 CTs: Pneumomediastinum extending into the neck, lower lobe consolidation, esophagus distention, air in the bladder, new mildly enlarged right hilar node, new small volume ascites Early satiety, postprandial abdominal pain, weight loss secondary to #1 History of bilateral lower extremity DVT 2021-anticoagulation discontinued due to massive retroperitoneal bleed, IVC filter placed 08/24/2020 Hospitalized with acute respiratory failure due to COVID-19 08/06/2020 - 09/04/2020 History of massive retroperitoneal bleed secondary to anticoagulation September 2021 Thyroid  ultrasound 01/13/2024-no abnormal nodule identified in the right lobe.  1.1 cm thyroid  isthmus nodule meets criteria for 1 year follow-up ultrasound. Admission 02/01/2024 with increased abdominal pain/ascites Hospitalization with altered mental status 02/05/2024 through 02/13/2024; question rare case of 5-FU psychosis.  Improved 02/16/2024.  Mental status at baseline 02/23/2024. Neutropenia 02/16/2024 Mucositis 02/16/2024.  Resolved 02/23/2024 Right knee  pain/edema/erythema 03/01/2024-Doppler study negative for DVT History of gout Oxaliplatin  neuropathy-prolonged cold sensitivity, diminished vibratory sense following cycle 5 chemotherapy.  Persistent cold sensitivity 05/20/2024, oxaliplatin  held.  Cold sensitivity resolved 06/03/2024, oxaliplatin  resumed.  Mild loss of vibratory sense, oxaliplatin  dose reduced 06/29/2024 Admission 11/28/2024 with intractable nausea and vomiting 11/27/2024 CT Abdo/pelvis: Bilateral lower lobe aspiration/pneumonia, thick-walled gastric body/antrum, dilated proximal small bowel, small volume pelvic ascites 11/30/2024 EGD: Malignant appearing severe stenosis at the gastric antrum-stent placed, dilated duodenum with retained fluid 15.  Anemia secondary to GI bleeding 12/12/2024 2 units RBCs   Mr Fandino has metastatic gastric cancer.  There is evidence of disease progression with persistent/progressive tumor in the stomach and intractable nausea/vomiting.  The omental disease on presentation last year remains significantly improved, by CT criteria.  He responded well to initial treatment with oxaliplatin  based chemotherapy.  He has neuropathy symptoms.  He had psychosis following 5-fluorouracil .  He began a trial of salvage therapy with cisplatin  on 11/26/2024.  He completed cycle 2 on 12/07/2024.  He is now admitted with intractable nausea/vomiting, potentially related to a small bowel obstruction, ileus from the carcinomatosis, in addition to persistent tumor in the stomach.   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  There was retained fluid in the stomach and duodenum.  A gastric antral stent was placed.  He continues to have nausea and vomiting of liquids despite placement of the gastric antral stent.  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  He developed recurrent severe nausea and vomiting 12/08/2024.  It is possible the  cisplatin  given 12/07/2024 contributed.  The nausea has improved with octreotide .  He has intermittent hematemesis, likely secondary to the gastric mass.  The hemoglobin is lower today.  Mr Grider has developed CT evidence of progressive aspiration pneumonia.  He also has pneumomediastinum with subcutaneous emphysema extending to the neck.  The significance of the emphysematous findings is unclear.  I reviewed the CT images.  He may have a tumor related microperforation or perforation related to emesis.  Mr Perfect has been evaluated by the palliative care service.  A family meeting is scheduled for 12/15/2024.  He stated again this morning that he would like to continue with treatment of the cancer.  He understands the chance of clinical improvement with further systemic therapy is small.  He would like to consider repeat treatment with 5-fluorouracil .  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  He has completed 2 treatments with cisplatin .  Week #3 is planned for today.  We can consider salvage therapy with paclitaxel or docetaxel as an outpatient.  Ramucirumab may increase the risk of bleeding.   Recommendations: Continue TPN, IV fluids when off of TPN Continue octreotide , Phenergan , and Ativan  Antibiotics for aspiration pneumonia Palliative care to meet with the family today Plan for-week #3 cisplatin  today   LOS: 17 days   Arley Hof, MD   12/15/2024, 8:13 AM

## 2024-12-15 NOTE — Plan of Care (Signed)
" °  Problem: Education: Goal: Knowledge of General Education information will improve Description: Including pain rating scale, medication(s)/side effects and non-pharmacologic comfort measures Outcome: Progressing   Problem: Health Behavior/Discharge Planning: Goal: Ability to manage health-related needs will improve Outcome: Progressing   Problem: Clinical Measurements: Goal: Will remain free from infection Outcome: Progressing   Problem: Activity: Goal: Risk for activity intolerance will decrease Outcome: Progressing   Problem: Nutrition: Goal: Adequate nutrition will be maintained Outcome: Progressing   Problem: Elimination: Goal: Will not experience complications related to bowel motility Outcome: Progressing Goal: Will not experience complications related to urinary retention Outcome: Progressing   Problem: Pain Managment: Goal: General experience of comfort will improve and/or be controlled Outcome: Progressing   Problem: Skin Integrity: Goal: Risk for impaired skin integrity will decrease Outcome: Progressing   Problem: Coping: Goal: Ability to adjust to condition or change in health will improve Outcome: Progressing   Problem: Health Behavior/Discharge Planning: Goal: Ability to identify and utilize available resources and services will improve Outcome: Progressing   Problem: Skin Integrity: Goal: Risk for impaired skin integrity will decrease Outcome: Progressing   "

## 2024-12-15 NOTE — Progress Notes (Signed)
 OK to treat with HGB 7.5 per Dr. Cloretta

## 2024-12-15 NOTE — Progress Notes (Signed)
 " PROGRESS NOTE    Henry May  FMW:[id number] DOB: [birth year] DOA: 11/27/2024 PCP: [name], DO    No chief complaint on file.   Brief Narrative:  58 year old M with PMH of DVT with IVC filter, gastric cancer on chemo and esophagitis presented to [facility] due to nausea, vomiting and coffee-ground emesis 2 days after he started new chemotherapy, and admitted for intractable nausea and vomiting, dilated small bowel loops and coffee-ground emesis.  Recent EGD on 12/4 with LA grade D esophagitis without bleeding, and large infiltrative, sessile and ulcerated circumferential mass with contact of bleeding in gastric body and antrum with antral narrowing and luminal diameter of approximately 1.5 cm   In ED, stable vitals.  CBC and CMP without significant finding.  Lipase 171.  CT abdomen and pelvis showed dilated proximal small bowel suggesting SBO but without discrete transition point, thick walled gastric body/antrum without definite focal mass, mild nodularity along the pylorus, scarring with superimposed patchy peribronchovascular nodularity in the bilateral lower lobes right more than left suggesting mild aspiration or pneumonia.  Patient was started on antiemetics, IV fluid, PPI and admitted for further care.  Oncology, surgery, GI and palliative medicine consulted.   Patient underwent EGD and gastric stent placement on 12/16.  Was unable to tolerate diet.  Started on TPN.  Plan was for discharge with TPN once logistic in place but nausea and vomiting gotten worse after stopping octreotide  and Decadron .  Also having hemoptysis with drop in hemoglobin.  Restarted on octreotide  infusion.     GI reengaged and order CT chest, abdomen and pelvis that showed new patchy substernal pneumomediastinum of indeterminate source, patchy consolidation in lower lobes and posterior LUL, esophageal distention with retained/reflux fluid in thoracic inlet, emphysematous cystitis and circumferential  gastric thickening with adjacent stranding change, antropyloric stenting and continued duodenal dilation, thickening jejunal folds.    Assessment & Plan:   Principal Problem:   Hematemesis with nausea Active Problems:   Intractable nausea and vomiting   Partial small bowel obstruction (HCC)   Diabetes mellitus without complication (HCC)   Gastric cancer (HCC)   History of DVT (deep vein thrombosis)   Protein-calorie malnutrition, severe   Gastric outlet obstruction   Decreased hemoglobin   Subcutaneous emphysema   Fever, unspecified   Aspiration pneumonia due to gastric secretions (HCC)   Coffee ground emesis  #1 gastric cancer, gastric luminal narrowing and esophagitis/intractable nausea and vomiting/acute blood loss anemia due to hematemesis likely secondary to bleeding from gastric cancer - CT done initially with dilated small bowel without transition point. - Patient seen by GI underwent EGD and gastric stenting 11/30/2024. - Repeat CT angiogram chest abdomen and pelvis as noted above. - Status post transfusion total of 2 units PRBCs on 12/12/2024 with hemoglobin currently stable at 7.5 from as low as 6.7. - Patient denies any hematemesis. - Patient noted to have had 2 bowel movements this morning that were brown. - IV Unasyn  resumed on 12/12/2023-patient currently on day 5/7. - Patient currently n.p.o. except ice chips and sips with meds. - Continue TPN per pharmacy. - Continue IV antiemetics of Phenergan , Zofran  and Ativan . - Octreotide  resumed 12/09/2024. - Continue IV PPI twice daily - Aspiration precautions. -GI, oncology, palliative care following.  2.  Gastric cancer  - Patient noted to have recently restarted on chemotherapy and being followed by Dr. Cloretta. - Status post EGD with gastric stenting 11/30/2024. - Patient noted to have received cisplatin  chemo on 12/07/2024. - Repeat CT chest  abdomen and pelvis was done as noted above. - Oncology planning with #3  cisplatin  today. - Oncology, palliative care following. - Palliative care met with family for family meeting today 12/15/2024 and will meet again on 12/18/2023.  3.  Aspiration pneumonitis/pneumonia -Initial chest x-ray done showed patchy airspace opacity in the right middle lobe. - Initial CT abdomen and pelvis with bibasilar infiltrate. - Patient noted to have completed 7 days of appropriate antibiotics. - Patient noted to have spiked a fever on 12/11/2024 with repeat chest x-ray showing stable right lung opacity/infiltrate. - Repeat CT chest abdomen and pelvis was done as noted above. - IV Unasyn  resumed on 12/11/2024. - Aspiration precautions - Wean oxygen  as able to.  4.  Pneumomediastinum -Noted on CT chest. - Felt could likely be secondary to retching and vomiting. - Follow-up.  5.  Diet-controlled diabetes with hyperglycemia -Hemoglobin A1c 5.7% (11/28/2024) - CBG noted at 145 this morning. - Patient on TPN per pharmacy. - SSI.  6.  History of DVT -Status post IVC filter.  7.  Elevated lipase -Improved.  8.  Insomnia mainly due to intractable nausea and vomiting -IV Ativan  and Phenergan  as above.  9.  Lightheadedness/presyncope -Likely secondary to intractable nausea vomiting hematemesis and volume depletion. - On TPN and IV fluids.  10.  Severe malnutrition - BMI of 22.1 kg/m. - Likely secondary to chronic illness of gastric cancer. - Patient with severe fat depletion, severe muscle depletion. - Patient on TPN. - RD following.   DVT prophylaxis: SCDs Code Status: Full Family Communication: Updated patient, wife, sisters at bedside via interpreter. Disposition: TBD  Status is: Inpatient Remains inpatient appropriate because: Severity of illness   Consultants:  Gastroenterology: Dr. Charlanne 11/28/2024 Palliative care: Dr. Antonette 11/28/2024 General Surgery: Dr. Curvin III 11/30/2024 Oncology: Dr. Cloretta  Procedures:  CT abdomen and pelvis 11/27/2024 CT  chest abdomen and pelvis 12/12/2024 Upper endoscopy per GI: Dr. Wilhelmenia 11/30/2024   Antimicrobials:  Anti-infectives (From admission, onward)    Start     Dose/Rate Route Frequency Ordered Stop   12/11/24 0900  Ampicillin -Sulbactam (UNASYN ) 3 g in sodium chloride  0.9 % 100 mL IVPB        3 g 200 mL/hr over 30 Minutes Intravenous Every 6 hours 12/11/24 0817     11/28/24 1200  Ampicillin -Sulbactam (UNASYN ) 3 g in sodium chloride  0.9 % 100 mL IVPB  Status:  Discontinued        3 g 200 mL/hr over 30 Minutes Intravenous Every 6 hours 11/28/24 1048 12/05/24 0909   11/28/24 0045  Ampicillin -Sulbactam (UNASYN ) 3 g in sodium chloride  0.9 % 100 mL IVPB        3 g 200 mL/hr over 30 Minutes Intravenous  Once 11/28/24 0042 11/28/24 0134         Subjective: Patient sitting up in bed, wife and sisters and family at bedside.  Patient denies any chest pain or shortness of breath.  Patient denies any nausea, no significant emesis.  Tolerating popsicles and sips.  Per wife patient had flatus and had 2 bowel movements this morning.  Patient denies any abdominal pain.  Palliative care, TOC, interpreter at bedside.  Objective: Vitals:   12/14/24 1742 12/14/24 2004 12/15/24 0500 12/15/24 0609  BP: (!) 100/56 110/62  120/64  Pulse: 66 75  78  Resp: (!) 24 18  20   Temp: 97.7 F (36.5 C) 98.6 F (37 C)  98.3 F (36.8 C)  TempSrc: Oral Oral  Oral  SpO2: 97% 96%  98%  Weight:   76.9 kg   Height:        Intake/Output Summary (Last 24 hours) at 12/15/2024 1225 Last data filed at 12/15/2024 1216 Gross per 24 hour  Intake 2978.78 ml  Output 1050 ml  Net 1928.78 ml   Filed Weights   12/11/24 0500 12/14/24 0500 12/15/24 0500  Weight: 74.7 kg 73.9 kg 76.9 kg    Examination:  General exam: Appears calm and comfortable  Respiratory system: Clear to auscultation.  No wheezes, no crackles, no rhonchi.  Fair air movement.  No use of accessory muscles of respiration.  Respiratory effort  normal. Cardiovascular system: S1 & S2 heard, RRR. No JVD, murmurs, rubs, gallops or clicks. No pedal edema. Gastrointestinal system: Abdomen is nondistended, soft and nontender. No organomegaly or masses felt. Normal bowel sounds heard. Central nervous system: Alert and oriented. No focal neurological deficits. Extremities: Symmetric 5 x 5 power. Skin: No rashes, lesions or ulcers Psychiatry: Judgement and insight appear normal. Mood & affect appropriate.     Data Reviewed: I have personally reviewed following labs and imaging studies  CBC: Recent Labs  Lab 12/10/24 0908 12/11/24 0337 12/12/24 0752 12/13/24 0304 12/13/24 0819 12/14/24 0408 12/15/24 0420  WBC 5.3 5.2 6.6 4.7  --  5.1 4.5  NEUTROABS 4.3 4.6  --   --  3.5 3.9 3.3  HGB 10.4* 8.3* 6.7* 8.3*  --  8.8* 7.5*  HCT 31.3* 25.8* 20.7* 25.5*  --  26.9* 23.2*  MCV 91.3 94.9 95.0 92.7  --  93.7 94.3  PLT 292 251 227 196  --  210 209    Basic Metabolic Panel: Recent Labs  Lab 12/10/24 0342 12/11/24 0337 12/12/24 0446 12/13/24 0304 12/14/24 0408 12/15/24 0420  NA 138 141 140 144 138 137  K 4.4 3.8 4.0 4.0 4.3 4.3  CL 104 106 105 109 103 102  CO2 24 26 28 29 24 26   GLUCOSE 250* 270* 259* 154* 155* 161*  BUN 37* 35* 33* 29* 26* 28*  CREATININE 0.73 0.75 0.69 0.60* 0.70 0.66  CALCIUM  8.6* 8.0* 8.1* 8.0* 8.1* 8.0*  MG 2.1 1.7 2.0 2.0 2.1  --   PHOS 2.2* 1.9* 3.1 3.7 3.1 3.0    GFR: Estimated Creatinine Clearance: 106.8 mL/min (by C-G formula based on SCr of 0.66 mg/dL).  Liver Function Tests: Recent Labs  Lab 12/11/24 0337 12/12/24 0446 12/13/24 0304 12/14/24 0408 12/15/24 0420  AST 17 14* 20 62* 34  ALT 44 32 51* 81* 65*  ALKPHOS 110 94 103 161* 147*  BILITOT 0.5 0.3 0.3 0.4 0.6  PROT 5.1* 5.0* 5.1* 5.3* 5.4*  ALBUMIN  2.5* 2.3* 2.4* 2.5* 2.4*    CBG: Recent Labs  Lab 12/14/24 0811 12/14/24 1148 12/14/24 1620 12/14/24 2005 12/15/24 0340  GLUCAP 142* 104* 87 160* 145*     No results found  for this or any previous visit (from the past 240 hours).       Radiology Studies: No results found.      Scheduled Meds:  Chlorhexidine  Gluconate Cloth  6 each Topical Daily   CISplatin   35 mg/m2 (Treatment Plan Recorded) Intravenous Once   insulin  aspart  0-15 Units Subcutaneous 4 times per day   mouth rinse  15 mL Mouth Rinse 4 times per day   pantoprazole  (PROTONIX ) IV  40 mg Intravenous Q12H   sucralfate   1 g Oral TID   Continuous Infusions:  ampicillin -sulbactam (UNASYN ) IV 3 g (12/15/24 0928)   octreotide  (SANDOSTATIN )  500 mcg in sodium chloride  0.9 % 250 mL (2 mcg/mL) infusion 50 mcg/hr (12/15/24 0909)   promethazine  (PHENERGAN ) injection (IM or IVPB) 25 mg (12/15/24 1026)   TPN CYCLIC-ADULT (ION)       LOS: 17 days    Time spent: 45 minutes    Toribio Hummer, MD [facility]   To contact the attending provider between 7A-7P or the covering provider during after hours 7P-7A, please log into the web site [email or web address] and access using universal [place] password for that web site. If you do not have the password, please call the hospital operator.  12/15/2024, 12:25 PM    "

## 2024-12-16 ENCOUNTER — Other Ambulatory Visit: Payer: Self-pay | Admitting: Oncology

## 2024-12-16 DIAGNOSIS — C163 Malignant neoplasm of pyloric antrum: Secondary | ICD-10-CM | POA: Diagnosis not present

## 2024-12-16 DIAGNOSIS — Z711 Person with feared health complaint in whom no diagnosis is made: Secondary | ICD-10-CM | POA: Diagnosis not present

## 2024-12-16 DIAGNOSIS — C169 Malignant neoplasm of stomach, unspecified: Secondary | ICD-10-CM | POA: Diagnosis not present

## 2024-12-16 DIAGNOSIS — Z515 Encounter for palliative care: Secondary | ICD-10-CM | POA: Diagnosis not present

## 2024-12-16 DIAGNOSIS — K920 Hematemesis: Secondary | ICD-10-CM | POA: Diagnosis not present

## 2024-12-16 DIAGNOSIS — Z7189 Other specified counseling: Secondary | ICD-10-CM | POA: Diagnosis not present

## 2024-12-16 DIAGNOSIS — K56609 Unspecified intestinal obstruction, unspecified as to partial versus complete obstruction: Secondary | ICD-10-CM | POA: Diagnosis not present

## 2024-12-16 LAB — PREPARE RBC (CROSSMATCH)

## 2024-12-16 LAB — CBC WITH DIFFERENTIAL/PLATELET
Abs Immature Granulocytes: 0.15 K/uL — ABNORMAL HIGH (ref 0.00–0.07)
Basophils Absolute: 0 K/uL (ref 0.0–0.1)
Basophils Relative: 0 %
Eosinophils Absolute: 0 K/uL (ref 0.0–0.5)
Eosinophils Relative: 0 %
HCT: 19.1 % — ABNORMAL LOW (ref 39.0–52.0)
Hemoglobin: 6.3 g/dL — CL (ref 13.0–17.0)
Immature Granulocytes: 4 %
Lymphocytes Relative: 12 %
Lymphs Abs: 0.4 K/uL — ABNORMAL LOW (ref 0.7–4.0)
MCH: 30.7 pg (ref 26.0–34.0)
MCHC: 33 g/dL (ref 30.0–36.0)
MCV: 93.2 fL (ref 80.0–100.0)
Monocytes Absolute: 0.2 K/uL (ref 0.1–1.0)
Monocytes Relative: 5 %
Neutro Abs: 3 K/uL (ref 1.7–7.7)
Neutrophils Relative %: 79 %
Platelets: 210 K/uL (ref 150–400)
RBC: 2.05 MIL/uL — ABNORMAL LOW (ref 4.22–5.81)
RDW: 14.4 % (ref 11.5–15.5)
Smear Review: NORMAL
WBC: 3.7 K/uL — ABNORMAL LOW (ref 4.0–10.5)
nRBC: 0 % (ref 0.0–0.2)

## 2024-12-16 LAB — COMPREHENSIVE METABOLIC PANEL WITH GFR
ALT: 56 U/L — ABNORMAL HIGH (ref 0–44)
AST: 29 U/L (ref 15–41)
Albumin: 2.3 g/dL — ABNORMAL LOW (ref 3.5–5.0)
Alkaline Phosphatase: 133 U/L — ABNORMAL HIGH (ref 38–126)
Anion gap: 8 (ref 5–15)
BUN: 28 mg/dL — ABNORMAL HIGH (ref 6–20)
CO2: 23 mmol/L (ref 22–32)
Calcium: 7.8 mg/dL — ABNORMAL LOW (ref 8.9–10.3)
Chloride: 104 mmol/L (ref 98–111)
Creatinine, Ser: 0.57 mg/dL — ABNORMAL LOW (ref 0.61–1.24)
GFR, Estimated: >60 mL/min
Glucose, Bld: 223 mg/dL — ABNORMAL HIGH (ref 70–99)
Potassium: 4.8 mmol/L (ref 3.5–5.1)
Sodium: 135 mmol/L (ref 135–145)
Total Bilirubin: 0.4 mg/dL (ref 0.0–1.2)
Total Protein: 5.1 g/dL — ABNORMAL LOW (ref 6.5–8.1)

## 2024-12-16 LAB — GLUCOSE, CAPILLARY
Glucose-Capillary: 101 mg/dL — ABNORMAL HIGH (ref 70–99)
Glucose-Capillary: 132 mg/dL — ABNORMAL HIGH (ref 70–99)
Glucose-Capillary: 208 mg/dL — ABNORMAL HIGH (ref 70–99)
Glucose-Capillary: 250 mg/dL — ABNORMAL HIGH (ref 70–99)
Glucose-Capillary: 87 mg/dL (ref 70–99)

## 2024-12-16 LAB — PHOSPHORUS: Phosphorus: 2.6 mg/dL (ref 2.5–4.6)

## 2024-12-16 LAB — MAGNESIUM: Magnesium: 2.4 mg/dL (ref 1.7–2.4)

## 2024-12-16 LAB — HEMOGLOBIN AND HEMATOCRIT, BLOOD
HCT: 27.9 % — ABNORMAL LOW (ref 39.0–52.0)
Hemoglobin: 9.2 g/dL — ABNORMAL LOW (ref 13.0–17.0)

## 2024-12-16 MED ORDER — ONDANSETRON HCL 4 MG/2ML IJ SOLN: 4.0000 mg | Freq: Once | INTRAMUSCULAR | Status: DC

## 2024-12-16 MED ORDER — FUROSEMIDE 10 MG/ML IJ SOLN
20.0000 mg | Freq: Once | INTRAMUSCULAR | Status: AC
  Administered 2024-12-16: 20 mg via INTRAVENOUS
  Filled 2024-12-16: qty 2

## 2024-12-16 MED ORDER — PROCHLORPERAZINE EDISYLATE 10 MG/2ML IJ SOLN
10.0000 mg | Freq: Four times a day (QID) | INTRAMUSCULAR | Status: AC | PRN
  Administered 2024-12-16 – 2024-12-17 (×2): 10 mg via INTRAVENOUS
  Filled 2024-12-16 (×2): qty 2

## 2024-12-16 MED ORDER — INSULIN ASPART 100 UNIT/ML IJ SOLN
0.0000 [IU] | Freq: Four times a day (QID) | INTRAMUSCULAR | Status: DC
  Administered 2024-12-17 (×3): 2 [IU] via SUBCUTANEOUS
  Administered 2024-12-18: 3 [IU] via SUBCUTANEOUS
  Administered 2024-12-18 (×3): 2 [IU] via SUBCUTANEOUS
  Administered 2024-12-19: 3 [IU] via SUBCUTANEOUS
  Administered 2024-12-19 – 2024-12-22 (×9): 2 [IU] via SUBCUTANEOUS
  Administered 2024-12-22: 3 [IU] via SUBCUTANEOUS
  Administered 2024-12-22: 2 [IU] via SUBCUTANEOUS
  Administered 2024-12-22: 3 [IU] via SUBCUTANEOUS
  Administered 2024-12-23: 2 [IU] via SUBCUTANEOUS
  Administered 2024-12-23: 3 [IU] via SUBCUTANEOUS
  Administered 2024-12-23 – 2024-12-24 (×4): 2 [IU] via SUBCUTANEOUS
  Filled 2024-12-16: qty 2
  Filled 2024-12-16 (×2): qty 3
  Filled 2024-12-16 (×17): qty 2
  Filled 2024-12-16: qty 3
  Filled 2024-12-16 (×2): qty 2
  Filled 2024-12-16 (×2): qty 3
  Filled 2024-12-16: qty 2

## 2024-12-16 MED ORDER — SODIUM CHLORIDE 0.9% IV SOLUTION: Freq: Once | INTRAVENOUS | Status: AC

## 2024-12-16 MED ORDER — TRAVASOL 10 % IV SOLN
INTRAVENOUS | Status: AC
  Filled 2024-12-16: qty 1122

## 2024-12-16 MED ORDER — ACETAMINOPHEN 325 MG PO TABS
650.0000 mg | ORAL_TABLET | Freq: Once | ORAL | Status: AC
  Administered 2024-12-16: 650 mg via ORAL
  Filled 2024-12-16: qty 2

## 2024-12-16 NOTE — Progress Notes (Signed)
 PHARMACY - TOTAL PARENTERAL NUTRITION CONSULT NOTE   Indication: intolerance to enteral feeds  Patient Measurements: Height: 6' (182.9 cm) Weight: 81.4 kg (179 lb 7.3 oz) IBW/kg (Calculated) : 77.6 TPN AdjBW (KG): 70 Body mass index is 24.34 kg/m.  Assessment: 28 yoM admitted on 12/13 with intractable nausea and vomiting, dilated small bowel loops, and coffee-ground emesis. 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  He underwent EGD 12/16 showing esophagitis, gastric stensosis, stent placed, impaired peristalsis. Pharmacy was consulted to dose TPN on 12/20 for intolerance to enteral feeding.    Glucose / Insulin : No hx DM, no PTA meds. A1c 5.7% on 12/14 -BG goal <180. Range: 110-250 (10 units SSI/24 hrs) -12/31 Dexamethasone  10 mg IV given as pre-med prior to chemo - Octreotide  infusion resumed 12/25 @ 50 mcg/hr (noted that Octreotide , especially at higher doses/frequencies, can cause glucose dysregulation) Electrolytes: All WNL, including CorrCa (9.2) -K (4.8) on upper end of normal, Phos (2.6) on lower end of normal Renal: SCr < 1, BUN 28 (elevated but stable) Hepatic: AST/ALT trending down 29/56; alk phos trending down 133 - Trig 164 (12/23), 132 (12/29) WNL - albumin  low at 2.3, T.bili WNL Intake / Output mIVF: none - Urine output: 250 mL - LBM: 12/31 GI Imaging: - 12/18 Abd xray: No bowel dilatation or evidence of obstruction. Retained gastric fluid. 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 Prosthesis placed. Duodenal dilation deformity  - 12/25 DG abd: nonobstructive bowel gas pattern - 12/27 DG abd: nonobstructive bowel gas pattern - 12/28 chest/abd CT: Esophageal distention. Nonspecific Enteritis. New mildly enlarged right hilar lymph node and a few slightly prominent bilateral hilar lymph nodes. New small volume of ascites  GI Surgeries / Procedures:  - 12/16 EGD: esophagitis  GI meds:   - Protonix  40mg  IV  q12h >> -  Octreotide  50 sq q8hrs >> DC'd 12/24; resumed 12/25 @50mcg /hr   Central access: Implanted Port (01/22/24) TPN start date: 12/20   Nutritional Goals: Goal TPN rate is 85 mL/hr and provides 112 g of protein and 2171 kcals per day)  RD Assessment: Estimated Needs Total Energy Estimated Needs: 2100-2450 kcals Total Protein Estimated Needs: 105-120 grams Total Fluid Estimated Needs: >/= 2.1L  Current Nutrition:  NPO except for ice chips, sips with meds or for comfort TPN  Plan:  Now - TPN pending insurance approval for outpatient use - GOC discussions ongoing with family - 1/1: Transitioning TPN back to continuous. Once pt is otherwise medically stable and no longer requiring octreotide  infusion, can transition back to cyclic TPN. This can be done in the outpatient setting if necessary. D/w MD.  At 1800: Transition TPN back to continuous at goal rate of 85 mL/hr Electrolytes in TPN: Reduce K, Ca. Increase Na Na 100 mEq/L K 40 mEq/L Ca 2 mEq/L Mg 5 mEq/L Phos 20 mmol/L Cl:Ac  to 1:1 Add standard MVI and trace elements to TPN Change CBG to mSSI q6h Monitor TPN labs on Mon/Thurs. Recheck electrolytes with AM labs tomorrow.   Ronal CHRISTELLA Rav, PharmD 12/16/2024 9:39 AM

## 2024-12-16 NOTE — Progress Notes (Signed)
 "                                                                                                                                                                                                          Daily Progress Note   Patient Name: Henry May       Date: 12/16/2024 DOB: Jan 08, 1966  Age: 59 y.o. MRN#: [id number] Attending Physician: Sebastian Toribio GAILS, MD Primary Care Physician: [name], DO Admit Date: 11/27/2024  Reason for Consultation/Follow-up: Establishing goals of care  Patient Profile/HPI: 59 yo M with PMH of  DVT with IVC, gastric cancer with mets to liver, peritoneum, omentum- currently on chemotherapy admitted with nausea, vomiting, hematemesis. Workup revealed likely SBO/illeus, possible aspiration pneumonia. Underwent gastric stent placement on 12/16 and was started on TPN.  N/V worsened during hospitalization, but has improved with octreotide .  Oncology consulted and he received chemotherapy while inpatient yesterday.  Admission has been complicated by ongoing hematemesis likely related to tumor, requiring blood transfusion. Oncology recommended considering radiation therapy.  Palliative consulted for goals of care.    Subjective: Chart reviewed including labs, progress notes, imaging from this and previous encounters.  Hgb this morning is 6.3 and patient is receiving a transfusion.  On evaluation he was awake and alert with his spouse at the bedside. He reports feeling well with no complaints. No nausea, no vomiting.  His spouse requests a meeting tomorrow at 1:30 for followup of meeting that occurred yesterday.    Review of Systems  Constitutional:  Positive for malaise/fatigue.     Physical Exam Vitals and nursing note reviewed.  Constitutional:      General: He is not in acute distress.    Appearance: He is ill-appearing.  Cardiovascular:     Rate and Rhythm: Normal rate.  Pulmonary:     Effort: Pulmonary effort is normal.  Neurological:      Mental Status: He is alert.             Vital Signs: BP 113/80   Pulse 68   Temp 98 F (36.7 C) (Oral)   Resp (!) 21   Ht 6' (1.829 m)   Wt 81.4 kg   SpO2 96%   BMI 24.34 kg/m  SpO2: SpO2: 96 % O2 Device: O2 Device: Room Air O2 Flow Rate: O2 Flow Rate (L/min): 2 L/min  Intake/output summary:  Intake/Output Summary (Last 24 hours) at 12/16/2024 1516 Last data filed at 12/16/2024 1243 Gross per 24 hour  Intake 4039.22 ml  Output --  Net 4039.22 ml   LBM:  Last BM Date : 12/11/25 Baseline Weight: Weight: 70 kg Most recent weight: Weight: 81.4 kg   Patient Active Problem List   Diagnosis Date Noted   Aspiration pneumonia due to gastric secretions (HCC) 12/13/2024   Coffee ground emesis 12/13/2024   Subcutaneous emphysema 12/11/2024   Fever, unspecified 12/11/2024   Decreased hemoglobin 12/10/2024   Protein-calorie malnutrition, severe 11/30/2024   Gastric outlet obstruction 11/30/2024   Hematemesis with nausea 11/28/2024   Intractable nausea and vomiting 11/28/2024   SBO (small bowel obstruction) (HCC) 11/28/2024   Acute kidney injury (AKI) with acute tubular necrosis (ATN) 02/05/2024   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   AKI (acute kidney injury) 02/01/2024   Gastric cancer (HCC) 01/26/2024   Knee pain 10/14/2022   Healthcare maintenance 10/14/2022   Diabetes mellitus without complication (HCC) 10/31/2020   Cardiac arrest (HCC) 10/20/2020   History of DVT (deep vein thrombosis) 08/19/2020   Chest pain 08/19/2020   Acute hypoxemic respiratory failure due to COVID-19 [facility] 08/08/2020   ARF (acute renal failure) 08/07/2020   Elevated LFTs 08/07/2020   Acute respiratory disease due to COVID-19 virus 08/07/2020   Pneumonia due to COVID-19 virus 08/07/2020   Acute respiratory failure due to COVID-19 [facility]) 08/06/2020    Palliative Care Assessment & Plan    Assessment/Recommendations/Plan  Stage IV gastric cancer- admitted with obstruction/illeus - continue current  interventions Plan for followup meeting tomorrow at 1330- I have left a message for Spanish interpreter   Code Status:   Code Status: Full Code   Prognosis:  Unable to determine  Discharge Planning: To Be Determined  Care plan was discussed with patient and family  Thank you for allowing the Palliative Medicine Team to assist in the care of this patient.  Total time:  Prolonged billing:  Time includes:   Preparing to see the patient (e.g., review of tests) Obtaining and/or reviewing separately obtained history Performing a medically necessary appropriate examination and/or evaluation Counseling and educating the patient/family/caregiver Ordering medications, tests, or procedures Referring and communicating with other health care professionals (when not reported separately) Documenting clinical information in the electronic or other health record Independently interpreting results (not reported separately) and communicating results to the patient/family/caregiver Care coordination (not reported separately) Clinical documentation  Cassondra Stain, AGNP-C Palliative Medicine   Please contact Palliative Medicine Team phone at [phone] for questions and concerns.        "

## 2024-12-16 NOTE — Progress Notes (Signed)
 Patient complaining of nausea. PRN phenergan  given to patient. Patient called this RN back into room later and had vomited. Patient stated that he felt that the phenergan  worked well, but that he woke up vomiting. Emesis was bloody, bright red, and a moderate amount. MD notified.

## 2024-12-16 NOTE — Progress Notes (Signed)
 " PROGRESS NOTE    Henry May  FMW:[id number] DOB: 20-Feb-1966 DOA: 11/27/2024 PCP: [name], DO    No chief complaint on file.   Brief Narrative:  59 year old M with PMH of DVT with IVC filter, gastric cancer on chemo and esophagitis presented to [facility] due to nausea, vomiting and coffee-ground emesis 2 days after he started new chemotherapy, and admitted for intractable nausea and vomiting, dilated small bowel loops and coffee-ground emesis.  Recent EGD on 12/4 with LA grade D esophagitis without bleeding, and large infiltrative, sessile and ulcerated circumferential mass with contact of bleeding in gastric body and antrum with antral narrowing and luminal diameter of approximately 1.5 cm   In ED, stable vitals.  CBC and CMP without significant finding.  Lipase 171.  CT abdomen and pelvis showed dilated proximal small bowel suggesting SBO but without discrete transition point, thick walled gastric body/antrum without definite focal mass, mild nodularity along the pylorus, scarring with superimposed patchy peribronchovascular nodularity in the bilateral lower lobes right more than left suggesting mild aspiration or pneumonia.  Patient was started on antiemetics, IV fluid, PPI and admitted for further care.  Oncology, surgery, GI and palliative medicine consulted.   Patient underwent EGD and gastric stent placement on 12/16.  Was unable to tolerate diet.  Started on TPN.  Plan was for discharge with TPN once logistic in place but nausea and vomiting gotten worse after stopping octreotide  and Decadron .  Also having hemoptysis with drop in hemoglobin.  Restarted on octreotide  infusion.     GI reengaged and order CT chest, abdomen and pelvis that showed new patchy substernal pneumomediastinum of indeterminate source, patchy consolidation in lower lobes and posterior LUL, esophageal distention with retained/reflux fluid in thoracic inlet, emphysematous cystitis and circumferential  gastric thickening with adjacent stranding change, antropyloric stenting and continued duodenal dilation, thickening jejunal folds.    Assessment & Plan:   Principal Problem:   Hematemesis with nausea Active Problems:   Intractable nausea and vomiting   SBO (small bowel obstruction) (HCC)   Diabetes mellitus without complication (HCC)   Gastric cancer (HCC)   History of DVT (deep vein thrombosis)   Protein-calorie malnutrition, severe   Gastric outlet obstruction   Decreased hemoglobin   Subcutaneous emphysema   Fever, unspecified   Aspiration pneumonia due to gastric secretions (HCC)   Coffee ground emesis  #1 gastric cancer, gastric luminal narrowing and esophagitis/intractable nausea and vomiting/acute blood loss anemia due to hematemesis likely secondary to bleeding from gastric cancer - CT done initially with dilated small bowel without transition point. - Patient seen by GI underwent EGD and gastric stenting 11/30/2024. - Repeat CT angiogram chest abdomen and pelvis as noted above. - Status post transfusion total of 2 units PRBCs on 12/12/2024 with hemoglobin currently stable at 6.3.  - Patient denies any hematemesis. - Patient noted to have had 2 bowel movements the morning of 12/15/2024.  - IV Unasyn  resumed on 12/12/2023-patient currently on day 5/7. - Patient currently n.p.o. except ice chips and sips with meds. - Continue TPN per pharmacy. - Continue IV antiemetics of Phenergan , Zofran  and Ativan . - Octreotide  resumed 12/09/2024. - Continue IV PPI twice daily - Aspiration precautions. -Transfuse 2 units PRBCs. -Oncology recommending radiation oncology evaluation for palliative radiation to gastric mass. -GI, oncology, palliative care following.  2.  Gastric cancer  - Patient noted to have recently restarted on chemotherapy and being followed by Dr. Cloretta. - Status post EGD with gastric stenting 11/30/2024. - Patient noted  to have received cisplatin  chemo on  12/07/2024. - Repeat CT chest abdomen and pelvis was done as noted above. - Oncology planning with #3 cisplatin  today. - Oncology, palliative care following. - Palliative care met with family and underwent family meeting on 12/15/2024 and will meet again on 12/18/2023.   3.  Aspiration pneumonitis/pneumonia -Initial chest x-ray done showed patchy airspace opacity in the right middle lobe. - Initial CT abdomen and pelvis with bibasilar infiltrate. - Patient noted to have completed 7 days of appropriate antibiotics. - Patient noted to have spiked a fever on 12/11/2024 with repeat chest x-ray showing stable right lung opacity/infiltrate. - Repeat CT chest abdomen and pelvis was done as noted above. - IV Unasyn  resumed on 12/11/2024. - Aspiration precautions - Wean oxygen  as able to.  4.  Pneumomediastinum -Noted on CT chest. - Felt could likely be secondary to retching and vomiting. - Follow-up.  5.  Diet-controlled diabetes with hyperglycemia -Hemoglobin A1c 5.7% (11/28/2024) - CBG noted at 250 this morning. - Continue TPN per pharmacy. - SSI.  6.  History of DVT -Status post IVC filter.  7.  Elevated lipase -Improved.  8.  Insomnia mainly due to intractable nausea and vomiting -IV Ativan  and Phenergan  as above. - Patient also on octreotide  per oncology.  9.  Lightheadedness/presyncope -Likely secondary to intractable nausea vomiting hematemesis and volume depletion. -Improved. - On TPN and IV fluids.  10.  Severe malnutrition - BMI of 22.1 kg/m. - Likely secondary to chronic illness of gastric cancer. - Patient with severe fat depletion, severe muscle depletion. - Patient on TPN. - RD following.   DVT prophylaxis: SCDs Code Status: Full Family Communication: Updated patient, wife at bedside. Disposition: TBD  Status is: Inpatient Remains inpatient appropriate because: Severity of illness   Consultants:  Gastroenterology: Dr. Charlanne 11/28/2024 Palliative care:  Dr. Antonette 11/28/2024 General Surgery: Dr. Curvin III 11/30/2024 Oncology: Dr. Cloretta  Procedures:  CT abdomen and pelvis 11/27/2024 CT chest abdomen and pelvis 12/12/2024 Upper endoscopy per GI: Dr. Wilhelmenia 11/30/2024   Antimicrobials:  Anti-infectives (From admission, onward)    Start     Dose/Rate Route Frequency Ordered Stop   12/11/24 0900  Ampicillin -Sulbactam (UNASYN ) 3 g in sodium chloride  0.9 % 100 mL IVPB        3 g 200 mL/hr over 30 Minutes Intravenous Every 6 hours 12/11/24 0817     11/28/24 1200  Ampicillin -Sulbactam (UNASYN ) 3 g in sodium chloride  0.9 % 100 mL IVPB  Status:  Discontinued        3 g 200 mL/hr over 30 Minutes Intravenous Every 6 hours 11/28/24 1048 12/05/24 0909   11/28/24 0045  Ampicillin -Sulbactam (UNASYN ) 3 g in sodium chloride  0.9 % 100 mL IVPB        3 g 200 mL/hr over 30 Minutes Intravenous  Once 11/28/24 0042 11/28/24 0134         Subjective: Patient sitting up in bed.  Denies any nausea.  Had a small bout of emesis this morning while oncology was in the room per patient.  Or emesis.  No bowel movement today.  Denies any bleeding.  Denies any chest pain or shortness of breath.  Wife at bedside.    Objective: Vitals:   12/16/24 1025 12/16/24 1243 12/16/24 1305 12/16/24 1310  BP: 112/65 113/76  113/80  Pulse: 61 (!) 57  68  Resp: 20 18  (!) 21  Temp: 98 F (36.7 C) 97.9 F (36.6 C)  98 F (36.7 C)  TempSrc: Oral Oral  Oral  SpO2:  95% 94% 96%  Weight:      Height:        Intake/Output Summary (Last 24 hours) at 12/16/2024 1330 Last data filed at 12/16/2024 1243 Gross per 24 hour  Intake 4403.28 ml  Output --  Net 4403.28 ml   Filed Weights   12/14/24 0500 12/15/24 0500 12/16/24 0500  Weight: 73.9 kg 76.9 kg 81.4 kg    Examination:  General exam: NAD. Respiratory system: CTAB.  No wheezes, no crackles, no rhonchi.  Fair air movement.  Speaking full sentences.  No use of accessory muscles of respiration.  Cardiovascular  system: Regular rate rhythm no murmurs rubs or gallops.  No JVD.  No lower extremity edema.  Gastrointestinal system: Abdomen is soft, nontender, nondistended, positive bowel sounds.  No rebound.  No guarding.  Central nervous system: Alert and oriented. No focal neurological deficits. Extremities: Symmetric 5 x 5 power. Skin: No rashes, lesions or ulcers Psychiatry: Judgement and insight appear normal. Mood & affect appropriate.     Data Reviewed: I have personally reviewed following labs and imaging studies  CBC: Recent Labs  Lab 12/11/24 0337 12/12/24 0752 12/13/24 0304 12/13/24 0819 12/14/24 0408 12/15/24 0420 12/16/24 0423  WBC 5.2 6.6 4.7  --  5.1 4.5 3.7*  NEUTROABS 4.6  --   --  3.5 3.9 3.3 3.0  HGB 8.3* 6.7* 8.3*  --  8.8* 7.5* 6.3*  HCT 25.8* 20.7* 25.5*  --  26.9* 23.2* 19.1*  MCV 94.9 95.0 92.7  --  93.7 94.3 93.2  PLT 251 227 196  --  210 209 210    Basic Metabolic Panel: Recent Labs  Lab 12/11/24 0337 12/12/24 0446 12/13/24 0304 12/14/24 0408 12/15/24 0420 12/16/24 0423  NA 141 140 144 138 137 135  K 3.8 4.0 4.0 4.3 4.3 4.8  CL 106 105 109 103 102 104  CO2 26 28 29 24 26 23   GLUCOSE 270* 259* 154* 155* 161* 223*  BUN 35* 33* 29* 26* 28* 28*  CREATININE 0.75 0.69 0.60* 0.70 0.66 0.57*  CALCIUM  8.0* 8.1* 8.0* 8.1* 8.0* 7.8*  MG 1.7 2.0 2.0 2.1  --  2.4  PHOS 1.9* 3.1 3.7 3.1 3.0 2.6    GFR: Estimated Creatinine Clearance: 107.8 mL/min (A) (by C-G formula based on SCr of 0.57 mg/dL (L)).  Liver Function Tests: Recent Labs  Lab 12/12/24 0446 12/13/24 0304 12/14/24 0408 12/15/24 0420 12/16/24 0423  AST 14* 20 62* 34 29  ALT 32 51* 81* 65* 56*  ALKPHOS 94 103 161* 147* 133*  BILITOT 0.3 0.3 0.4 0.6 0.4  PROT 5.0* 5.1* 5.3* 5.4* 5.1*  ALBUMIN  2.3* 2.4* 2.5* 2.4* 2.3*    CBG: Recent Labs  Lab 12/15/24 1554 12/15/24 2022 12/16/24 0314 12/16/24 0504 12/16/24 1205  GLUCAP 113* 224* 208* 250* 101*     No results found for this or any  previous visit (from the past 240 hours).       Radiology Studies: No results found.      Scheduled Meds:  Chlorhexidine  Gluconate Cloth  6 each Topical Daily   insulin  aspart  0-15 Units Subcutaneous 4 times per day   insulin  aspart  0-15 Units Subcutaneous Q6H   mouth rinse  15 mL Mouth Rinse 4 times per day   pantoprazole  (PROTONIX ) IV  40 mg Intravenous Q12H   sucralfate   1 g Oral TID   Continuous Infusions:  ampicillin -sulbactam (UNASYN ) IV Stopped (12/16/24 0908)  octreotide  (SANDOSTATIN ) 500 mcg in sodium chloride  0.9 % 250 mL (2 mcg/mL) infusion 50 mcg/hr (12/16/24 1124)   promethazine  (PHENERGAN ) injection (IM or IVPB) Stopped (12/15/24 1046)   TPN ADULT (ION)       LOS: 18 days    Time spent: 45 minutes    Henry Hummer, MD [facility]   To contact the attending provider between 7A-7P or the covering provider during after hours 7P-7A, please log into the web site [email or web address] and access using universal [place] password for that web site. If you do not have the password, please call the hospital operator.  12/16/2024, 1:30 PM    "

## 2024-12-16 NOTE — Progress Notes (Addendum)
 IP PROGRESS NOTE  Subjective:   Henry May appears unchanged.  His wife is at the bedside when I saw him at approximately 6:30 AM.  He did not vomit yesterday.  He had a small amount of dark/bloody emesis this morning.  No pain.  No bowel movement. He did not have nausea following chemotherapy yesterday.  He is tolerating liquids.  Objective: Vital signs in last 24 hours: Blood pressure 125/65, pulse 75, temperature 98.5 F (36.9 C), temperature source Oral, resp. rate 20, height 6' (1.829 m), weight 179 lb 7.3 oz (81.4 kg), SpO2 97%.  Intake/Output from previous day: 12/31 0701 - 01/01 0700 In: 4018.5 [P.O.:100; I.V.:2586.1; IV Piggyback:1332.4] Out: 250 [Urine:250]  Physical Exam:  HEENT: No thrush Lungs: Clear bilaterally, no respiratory distress Cardiac: Regular rate and rhythm Abdomen: Soft, nontender, no hepatomegaly Extremities: No leg edema   Portacath/PICC-without erythema  Lab Results: Recent Labs    12/15/24 0420 12/16/24 0423  WBC 4.5 3.7*  HGB 7.5* 6.3*  HCT 23.2* 19.1*  PLT 209 210      BMET Recent Labs    12/15/24 0420 12/16/24 0423  NA 137 135  K 4.3 4.8  CL 102 104  CO2 26 23  GLUCOSE 161* 223*  BUN 28* 28*  CREATININE 0.66 0.57*  CALCIUM  8.0* 7.8*    Lab Results  Component Value Date   CEA 3.57 01/15/2024      Medications: I have reviewed the patient's current medications.  Assessment/Plan: Gastric cancer 11/21/2023 EGD-gastric body with infiltrative looking lesion; biopsy shows involvement of a hypercellular lesion that suggests diffuse carcinoma by morphology CTs abdomen/pelvis 11/28/2023-diffuse fatty infiltration of the liver; gastric wall thickening; lymph nodes up to 6 mm in diameter near the stomach wall. 12/31/2023 upper endoscopy-large diffuse friable infiltrative polypoid and ulcerated circumferential mass found in the gastric body.  Scope was passed beyond the mass with normal antrum/pylorus.  The mass came within 2 cm  of the GE junction; biopsy shows poorly differentiated adenocarcinoma with signet ring cells.  Negative for HER2 (1+); mismatch repair protein IHC normal; PD-L1 CPS 0%; CLDN18 positive: 90% of tumor cells with 2+/3+ membrane staining PET scan 01/07/2024-circumferential hypermetabolic gastric mucosal thickening.  Ill-defined hypermetabolic lymph nodes in the gastrohepatic ligament.  Intense hypermetabolic activity associated with the right lobe of the thyroid  gland.  Small hypermetabolic right axillary node favored reactive. 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  Gastric wall thickening slightly improved. Cycle 7 oxaliplatin , zolbetuximab 05/05/2024, Fulphila  Cycle 8 zolbetuximab 05/20/2024, oxaliplatin  held due to neuropathy, no Fulphila  Cycle 9 oxaliplatin /zolbetuximab 06/03/2024, Fulphila  Cycle 10 oxaliplatin /zolbetuximab 06/17/2024, Fulphila  Cycle 11 oxaliplatin /zolbetuximab 06/30/2024, Fulphila , oxaliplatin  dose reduced Cycle 12 zolbetuximab 07/15/2024, oxaliplatin  held secondary to neuropathy Cycle 13 zolbetuximab 07/29/2024, oxaliplatin  held secondary to neuropathy Cycle 14 zolbetuximab 08/26/2024, oxaliplatin  held secondary to neuropathy 09/01/2024 CTs-mild distal gastric wall thickening without discrete mass.  No metastatic disease. Cycle 15 zolbetuximab 09/09/2024, oxaliplatin  held due to neuropathy Cycle 16  zolbetuximab 09/30/2024, oxaliplatin  held due to neuropathy Cycle 17 zolbetuximab 10/21/2024, oxaliplatin  held due to neuropathy 11/03/2024 CT abdomen/pelvis: Diffuse mild wall thickening of the distal gastric body and  antrum-stable, mild increase in a right gastric lymph node small volume ascites without omental nodularity, proximal jejunal mural thickening 11/04/2024 upper GI: Narrowing of the gastric antrum, normal gastric emptying, dilated duodenum and proximal jejunum without evidence of a bowel obstruction 11/18/2024 upper endoscopy: Tumor in the gastric body and antrum with antral narrowing, dilated duodenum without evidence of mass or obstruction, esophagitis: Biopsy of the gastric mass-poorly differentiated adenocarcinoma with focal signet ring morphology, esophagus biopsy: Squamous mucosa with focal ulceration Cycle 1 weekly cisplatin  11/26/2024 Cycle 2 weekly cisplatin  12/07/2024 Cycle 3 weekly cisplatin  12/15/2024 12/12/2024 CTs: Pneumomediastinum extending into the neck, lower lobe consolidation, esophagus distention, air in the bladder, new mildly enlarged right hilar node, new small volume ascites Early satiety, postprandial abdominal pain, weight loss secondary to #1 History of bilateral lower extremity DVT 2021-anticoagulation discontinued due to massive retroperitoneal bleed, IVC filter placed 08/24/2020 Hospitalized with acute respiratory failure due to COVID-19 08/06/2020 - 09/04/2020 History of massive retroperitoneal bleed secondary to anticoagulation September 2021 Thyroid  ultrasound 01/13/2024-no abnormal nodule identified in the right lobe.  1.1 cm thyroid  isthmus nodule meets criteria for 1 year follow-up ultrasound. Admission 02/01/2024 with increased abdominal pain/ascites Hospitalization with altered mental status 02/05/2024 through 02/13/2024; question rare case of 5-FU psychosis.  Improved 02/16/2024.  Mental status at baseline 02/23/2024. Neutropenia 02/16/2024 Mucositis 02/16/2024.   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  Persistent cold sensitivity 05/20/2024, oxaliplatin  held.  Cold sensitivity resolved 06/03/2024, oxaliplatin  resumed.  Mild loss of vibratory sense, oxaliplatin  dose reduced 06/29/2024 Admission 11/28/2024 with intractable nausea and vomiting 11/27/2024 CT Abdo/pelvis: Bilateral lower lobe aspiration/pneumonia, thick-walled gastric body/antrum, dilated proximal small bowel, small volume pelvic ascites 11/30/2024 EGD: Malignant appearing severe stenosis at the gastric antrum-stent placed, dilated duodenum with retained fluid 15.  Anemia secondary to GI bleeding 12/12/2024 2 units RBCs   Henry May has metastatic gastric cancer.  There is evidence of disease progression with persistent/progressive tumor in the stomach and intractable nausea/vomiting.  The omental disease on presentation last year remains significantly improved, by CT criteria.  He responded well to initial treatment with oxaliplatin  based chemotherapy.  He has neuropathy symptoms.  He had psychosis following 5-fluorouracil .  He began a trial of salvage therapy with cisplatin  on 11/26/2024.  He completed cycle 3 on 12/15/2024.  He is now admitted with intractable nausea/vomiting, potentially related to a small bowel obstruction, ileus from the carcinomatosis, in addition to persistent tumor in the stomach.   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  There was retained fluid in the stomach and duodenum.  A gastric antral stent was placed.  He continues to have nausea and vomiting of liquids despite placement of the gastric antral stent.  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  He developed recurrent severe nausea and vomiting  12/08/2024.  The nausea has improved with octreotide .  He has intermittent hematemesis, likely secondary to the gastric mass.  GI does not recommend an endoscopy.  The hemoglobin is lower today.  I will ask radiation oncology to consider palliative radiation to the gastric mass in an attempt to alleviate hematemesis.  Henry May has developed CT evidence of progressive aspiration pneumonia.  He also has pneumomediastinum with subcutaneous emphysema extending to the neck.  The significance of the emphysematous findings is unclear.  I reviewed the CT images.  He may  have a tumor related microperforation or perforation related to emesis.  Henry May has been evaluated by the palliative care service.  The family met with palliative care yesterday and is scheduled to meet again tomorrow.  They understand the poor prognosis.  He wants to continue treatment.  We submitted tissue for molecular testing to look for options for targeted treatment and assess the tumor mutation burden.  Recommendations: Continue TPN, IV fluids when off of TPN Continue octreotide , Phenergan , and Ativan  Antibiotics for aspiration pneumonia Palliative care to meet with the family tomorrow Transfuse packed red blood cells Consult radiation oncology to consider palliative radiation to the gastric mass-I will contact them   LOS: 18 days   Arley Hof, MD   12/16/2024, 7:01 AM

## 2024-12-17 ENCOUNTER — Other Ambulatory Visit: Payer: Self-pay

## 2024-12-17 ENCOUNTER — Ambulatory Visit: Payer: Self-pay | Attending: Radiation Oncology | Admitting: Radiation Oncology

## 2024-12-17 ENCOUNTER — Encounter: Payer: Self-pay | Admitting: Oncology

## 2024-12-17 ENCOUNTER — Ambulatory Visit
Admit: 2024-12-17 | Discharge: 2024-12-17 | Disposition: A | Discharge status: Home / Self Care | Payer: Self-pay | Attending: Radiation Oncology | Admitting: Radiation Oncology

## 2024-12-17 ENCOUNTER — Ambulatory Visit: Payer: Self-pay

## 2024-12-17 ENCOUNTER — Inpatient Hospital Stay: Admitting: Oncology

## 2024-12-17 DIAGNOSIS — E43 Unspecified severe protein-calorie malnutrition: Secondary | ICD-10-CM | POA: Diagnosis not present

## 2024-12-17 DIAGNOSIS — E119 Type 2 diabetes mellitus without complications: Secondary | ICD-10-CM | POA: Diagnosis not present

## 2024-12-17 DIAGNOSIS — C163 Malignant neoplasm of pyloric antrum: Secondary | ICD-10-CM | POA: Diagnosis not present

## 2024-12-17 DIAGNOSIS — C169 Malignant neoplasm of stomach, unspecified: Secondary | ICD-10-CM

## 2024-12-17 DIAGNOSIS — R748 Abnormal levels of other serum enzymes: Secondary | ICD-10-CM

## 2024-12-17 DIAGNOSIS — R112 Nausea with vomiting, unspecified: Secondary | ICD-10-CM | POA: Diagnosis not present

## 2024-12-17 DIAGNOSIS — K920 Hematemesis: Secondary | ICD-10-CM

## 2024-12-17 DIAGNOSIS — J690 Pneumonitis due to inhalation of food and vomit: Secondary | ICD-10-CM | POA: Diagnosis not present

## 2024-12-17 DIAGNOSIS — Z7189 Other specified counseling: Secondary | ICD-10-CM

## 2024-12-17 DIAGNOSIS — R710 Precipitous drop in hematocrit: Secondary | ICD-10-CM | POA: Diagnosis not present

## 2024-12-17 DIAGNOSIS — D649 Anemia, unspecified: Secondary | ICD-10-CM | POA: Diagnosis not present

## 2024-12-17 DIAGNOSIS — Z86718 Personal history of other venous thrombosis and embolism: Secondary | ICD-10-CM

## 2024-12-17 DIAGNOSIS — K56609 Unspecified intestinal obstruction, unspecified as to partial versus complete obstruction: Secondary | ICD-10-CM

## 2024-12-17 DIAGNOSIS — Z515 Encounter for palliative care: Secondary | ICD-10-CM | POA: Diagnosis not present

## 2024-12-17 LAB — GLUCOSE, CAPILLARY
Glucose-Capillary: 115 mg/dL — ABNORMAL HIGH (ref 70–99)
Glucose-Capillary: 135 mg/dL — ABNORMAL HIGH (ref 70–99)
Glucose-Capillary: 140 mg/dL — ABNORMAL HIGH (ref 70–99)
Glucose-Capillary: 163 mg/dL — ABNORMAL HIGH (ref 70–99)

## 2024-12-17 LAB — RAD ONC ARIA SESSION SUMMARY
Course Elapsed Days: 0
Plan Fractions Treated to Date: 1
Plan Prescribed Dose Per Fraction: 4 Gy
Plan Total Fractions Prescribed: 1
Plan Total Prescribed Dose: 4 Gy
Reference Point Dosage Given to Date: 4 Gy
Reference Point Session Dosage Given: 4 Gy
Session Number: 1

## 2024-12-17 LAB — PHOSPHORUS: Phosphorus: 2.9 mg/dL (ref 2.5–4.6)

## 2024-12-17 LAB — CBC
HCT: 27.8 % — ABNORMAL LOW (ref 39.0–52.0)
Hemoglobin: 9.2 g/dL — ABNORMAL LOW (ref 13.0–17.0)
MCH: 30 pg (ref 26.0–34.0)
MCHC: 33.1 g/dL (ref 30.0–36.0)
MCV: 90.6 fL (ref 80.0–100.0)
Platelets: 272 K/uL (ref 150–400)
RBC: 3.07 MIL/uL — ABNORMAL LOW (ref 4.22–5.81)
RDW: 15.4 % (ref 11.5–15.5)
WBC: 4.5 K/uL (ref 4.0–10.5)
nRBC: 0 % (ref 0.0–0.2)

## 2024-12-17 LAB — BASIC METABOLIC PANEL WITH GFR
Anion gap: 10 (ref 5–15)
BUN: 31 mg/dL — ABNORMAL HIGH (ref 6–20)
CO2: 25 mmol/L (ref 22–32)
Calcium: 7.9 mg/dL — ABNORMAL LOW (ref 8.9–10.3)
Chloride: 103 mmol/L (ref 98–111)
Creatinine, Ser: 0.66 mg/dL (ref 0.61–1.24)
GFR, Estimated: >60 mL/min
Glucose, Bld: 131 mg/dL — ABNORMAL HIGH (ref 70–99)
Potassium: 4 mmol/L (ref 3.5–5.1)
Sodium: 137 mmol/L (ref 135–145)

## 2024-12-17 LAB — MAGNESIUM: Magnesium: 2.1 mg/dL (ref 1.7–2.4)

## 2024-12-17 MED ORDER — TRAVASOL 10 % IV SOLN
INTRAVENOUS | Status: AC
  Filled 2024-12-17: qty 1188

## 2024-12-17 NOTE — Progress Notes (Signed)
 "                                                                                                                                                                                                          Daily Progress Note   Patient Name: Henry May       Date: 12/17/2024 DOB: 11-03-1966  Age: 59 y.o. 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 Workup revealed likely SBO/illeus, possible aspiration pneumonia. Underwent gastric stent placement on 12/16 and was started on TPN.  N/V worsened during hospitalization, but has improved with octreotide .  Oncology consulted and he received chemotherapy while inpatient yesterday.  Admission has been complicated by ongoing hematemesis likely related to tumor, requiring blood transfusion. Oncology recommended considering radiation therapy.  Palliative consulted for goals of care.    Subjective: Chart reviewed including labs, progress notes, imaging from this and previous encounters.  Hgb this morning is 9.2. He reports vomiting x 1 bloody emesis this morning. Feels nausea is best relieved with phenergan .  Per radiation note he underwent simulation today and is to start radiation therapy this afternoon.  I met at the bedside with patient, his spouse, three children, two sisters and niece. Spanish interpreter Henry May was also present.  We discussed the role of Palliative medicine in patient's care.  Patient is hopeful that radiation will help stop his tumor from bleeding and he can stabilize to a point where he can discharge home. He would want to continue on TPN at home.  Long discussion was held.  We discussed the unfortunate  terminal nature of his cancer. Answered family member's questions regarding the purpose of radiation therapy, and the purpose of chemotherapy. Patient and his family understand that these treatments are with the purpose to hopefully help with his symptoms but are not going to cure his cancer.  Henry May shares that his overall goals are to continue life prolonging interventions for now, however, if his treatments or his cancer begin to cause him a great deal of distress or pain, then he would want to stop aggressive interventions and focus on his comfort.  Hospice philosophy of care and services were discussed.  Henry May was very clear that if he would not want CPR if he declined to point of end of life.  His  family had questions regarding CPR, but ultimately they expressed full support for [name]'s decision regarding his care and all present agreed and understood DNR code status.  Henry May's family expressed being supportive of his ability to make his own decisions. He shares that he will let them know when and if his treatments become too much to bear.    Review of Systems  Constitutional:  Positive for malaise/fatigue.  Gastrointestinal:  Positive for nausea.     Physical Exam Vitals and nursing note reviewed.  Constitutional:      General: He is not in acute distress.    Appearance: He is ill-appearing.  Cardiovascular:     Rate and Rhythm: Normal rate.  Pulmonary:     Effort: Pulmonary effort is normal.  Neurological:     Mental Status: He is alert.             Vital Signs: BP 113/67 (BP Location: Right Arm)   Pulse 74   Temp 98.1 F (36.7 C) (Oral)   Resp 18   Ht 6' (1.829 m)   Wt 79.3 kg   SpO2 100%   BMI 23.71 kg/m  SpO2: SpO2: 100 % O2 Device: O2 Device: Nasal Cannula O2 Flow Rate: O2 Flow Rate (L/min): 2 L/min  Intake/output summary:  Intake/Output Summary (Last 24 hours) at 12/17/2024 1456 Last data filed at 12/17/2024 9547 Gross per 24 hour  Intake 1182.91 ml   Output --  Net 1182.91 ml   LBM: Last BM Date : 12/11/25 Baseline Weight: Weight: 70 kg Most recent weight: Weight: 79.3 kg   Patient Active Problem List   Diagnosis Date Noted   Aspiration pneumonia due to gastric secretions (HCC) 12/13/2024   Coffee ground emesis 12/13/2024   Subcutaneous emphysema 12/11/2024   Fever, unspecified 12/11/2024   Decreased hemoglobin 12/10/2024   Protein-calorie malnutrition, severe 11/30/2024   Gastric outlet obstruction 11/30/2024   Hematemesis with nausea 11/28/2024   Intractable nausea and vomiting 11/28/2024   SBO (small bowel obstruction) (HCC) 11/28/2024   Acute kidney injury (AKI) with acute tubular necrosis (ATN) 02/05/2024   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   AKI (acute kidney injury) 02/01/2024   Gastric cancer (HCC) 01/26/2024   Knee pain 10/14/2022   Healthcare maintenance 10/14/2022   Diabetes mellitus without complication (HCC) 10/31/2020   Cardiac arrest (HCC) 10/20/2020   History of DVT (deep vein thrombosis) 08/19/2020   Chest pain 08/19/2020   Acute hypoxemic respiratory failure due to COVID-19 [facility]) 08/08/2020   ARF (acute renal failure) 08/07/2020   Elevated LFTs 08/07/2020   Acute respiratory disease due to COVID-19 virus 08/07/2020   Pneumonia due to COVID-19 virus 08/07/2020   Acute respiratory failure due to COVID-19 [facility]) 08/06/2020    Palliative Care Assessment & Plan    Assessment/Recommendations/Plan  Stage IV gastric cancer- admitted with obstruction/illeus - continue current interventions- plan to start radiation therapy today for bleeding tumor Code status changed to DNR   Code Status:   Code Status: Limited: Do not attempt resuscitation (DNR) -DNR-LIMITED -Do Not Intubate/DNI    Prognosis:  Unable to determine  Discharge Planning: To Be Determined  Care plan was discussed with patient and family  Thank you for allowing the Palliative Medicine Team to assist in the care of this  patient.  Total time:  100 minutes Prolonged billing:  Time includes:   Preparing to see the patient (e.g., review of tests) Obtaining and/or reviewing separately obtained history Performing a medically necessary appropriate examination and/or evaluation Counseling and  educating the patient/family/caregiver Ordering medications, tests, or procedures Referring and communicating with other health care professionals (when not reported separately) Documenting clinical information in the electronic or other health record Independently interpreting results (not reported separately) and communicating results to the patient/family/caregiver Care coordination (not reported separately) Clinical documentation  Cassondra Stain, AGNP-C Palliative Medicine   Please contact Palliative Medicine Team phone at [phone] for questions and concerns.        "

## 2024-12-17 NOTE — Progress Notes (Signed)
 Nutrition Follow-up  DOCUMENTATION CODES:   Severe malnutrition in context of chronic illness  INTERVENTION:  - TPN back to continuous at this time for CBG control.  - Plan is to eventually transition back to cyclic TPN as able.             - TPN management per pharmacy.    - Daily weights while on TPN.   NUTRITION DIAGNOSIS:   Severe Malnutrition related to chronic illness (gastric cancer) as evidenced by severe fat depletion, severe muscle depletion, percent weight loss (24% weight loss in less than 3 months). *ongoing  GOAL:   Patient will meet greater than or equal to 90% of their needs *met with TPN  MONITOR:   Diet advancement, Labs, Weight trends  REASON FOR ASSESSMENT:   Malnutrition Screening Tool, NPO/Clear Liquid Diet (NPO x3 days)    ASSESSMENT:   59 y.o. male with PMH of gastric cancer on chemo and recent esophagitis who presented due to nausea, vomiting and coffee-ground emesis 2 days after he started new chemotherapy. Admitted for intractable nausea and vomiting, dilated small bowel loops with concern for partial SBO, and coffee-ground emesis.  12/13 Admit; NPO  12/16 s/p upper GI endoscopy with gastric stent placement and NGT placement; CLD 12/17 NPO 12/18 CLD  12/19 FLD 12/20 CLD; TPN initiated 12/21 NGT came out 12/22 NPO; TPN increased to goal 12/23 TPN changed to cycle x18 hours 12/24 TPN changed to cycle x12 hours 12/27 TPN changed to cycle x18 hours due to lack of CBG control (pharmacy suspects from Octreotide  infusion) 1/1 TPN changed back to 24 hour continuous infusion  TPN was able to be cycled down to 12 hours as of 12/24 however it was noted patient's CBG's were not controlled. Pharmacy suspecting this was due to Octreotide  infusion. Since that time, he was transitioned to 18 hour cyclic TPN and then as of yesterday, back to continuous infusion. Pharmacy's note today started that once he is not longer requiring Octreotide  infusion can likely  transition back to cyclic TPN.   Plan for patient to continue TPN at home once able to be discharged. [employer] involved for home infusion. Last TOC update was from 12/26, at which time insurance authorization was still pending.     Admit weight: 154# Current weight: 174# I&O's: +29.8L since 12/19   Medications reviewed and include: Protonix , Carafate , Phenergan  Q6H, Octreotide     Labs reviewed:  Triglycerides 132 (as of 12/29)  Diet Order:   Diet Order             Diet NPO time specified Except for: Ice Chips, Sips with Meds, Other (See Comments)  Diet effective now                   EDUCATION NEEDS:  Education needs have been addressed  Skin:  Skin Assessment: Reviewed RN Assessment  Last BM:  12/27  Height:  Ht Readings from Last 1 Encounters:  11/30/24 6' (1.829 m)   Weight:  Wt Readings from Last 1 Encounters:  12/17/24 79.3 kg   Ideal Body Weight:  80.91 kg  BMI:  Body mass index is 23.71 kg/m.  Estimated Nutritional Needs:  Kcal:  2100-2450 kcals Protein:  105-120 grams Fluid:  >/= 2.1L    Trude Ned RD, LDN Contact via Secure Chat.

## 2024-12-17 NOTE — Progress Notes (Signed)
 IP PROGRESS NOTE  Subjective:   Mr. Ohms is lethargic this morning after receiving Ativan .  His wife was at the bedside when I saw him at approximately 7:15 AM.  She reports he had a large volume of emesis with blood this morning.  He is coughing. He denies pain.  No bowel movement.  Objective: Vital signs in last 24 hours: Blood pressure 121/75, pulse 85, temperature 98.3 F (36.8 C), temperature source Oral, resp. rate 18, height 6' (1.829 m), weight 174 lb 13.2 oz (79.3 kg), SpO2 95%.  Intake/Output from previous day: 01/01 0701 - 01/02 0700 In: 2286.1 [P.O.:50; I.V.:1024.1; Blood:712; IV Piggyback:500] Out: -   Physical Exam:  HEENT: No thrush Lungs: Decreased breath sounds at the right greater than left lower posterior chest, no respiratory distress Cardiac: Regular rate and rhythm Abdomen: Soft, nontender, no hepatomegaly Extremities: Trace pitting edema at the foot bilaterally   Portacath/PICC-without erythema  Lab Results: Recent Labs    12/16/24 0423 12/16/24 1750 12/17/24 0341  WBC 3.7*  --  4.5  HGB 6.3* 9.2* 9.2*  HCT 19.1* 27.9* 27.8*  PLT 210  --  272      BMET Recent Labs    12/16/24 0423 12/17/24 0341  NA 135 137  K 4.8 4.0  CL 104 103  CO2 23 25  GLUCOSE 223* 131*  BUN 28* 31*  CREATININE 0.57* 0.66  CALCIUM  7.8* 7.9*    Lab Results  Component Value Date   CEA 3.57 01/15/2024      Medications: I have reviewed the patient's current medications.  Assessment/Plan: Gastric cancer 11/21/2023 EGD-gastric body with infiltrative looking lesion; biopsy shows involvement of a hypercellular lesion that suggests diffuse carcinoma by morphology CTs abdomen/pelvis 11/28/2023-diffuse fatty infiltration of the liver; gastric wall thickening; lymph nodes up to 6 mm in diameter near the stomach wall. 12/31/2023 upper endoscopy-large diffuse friable infiltrative polypoid and ulcerated circumferential mass found in the gastric body.  Scope was  passed beyond the mass with normal antrum/pylorus.  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  Negative for HER2 (1+); mismatch repair protein IHC normal; PD-L1 CPS 0%; CLDN18 positive: 90% of tumor cells with 2+/3+ membrane staining PET scan 01/07/2024-circumferential hypermetabolic gastric mucosal thickening.  Ill-defined hypermetabolic lymph nodes in the gastrohepatic ligament.  Intense hypermetabolic activity associated with the right lobe of the thyroid  gland.  Small hypermetabolic right axillary node favored reactive. 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  Gastric wall thickening slightly improved. Cycle 7 oxaliplatin , zolbetuximab 05/05/2024, Fulphila  Cycle 8 zolbetuximab 05/20/2024, oxaliplatin  held due to neuropathy, no Fulphila  Cycle 9 oxaliplatin /zolbetuximab 06/03/2024, Fulphila  Cycle 10 oxaliplatin /zolbetuximab 06/17/2024, Fulphila  Cycle 11 oxaliplatin /zolbetuximab 06/30/2024, Fulphila , oxaliplatin  dose reduced Cycle 12 zolbetuximab 07/15/2024, oxaliplatin  held secondary to neuropathy Cycle 13 zolbetuximab 07/29/2024, oxaliplatin  held secondary to neuropathy Cycle 14 zolbetuximab 08/26/2024, oxaliplatin  held secondary to neuropathy 09/01/2024 CTs-mild distal gastric wall thickening without discrete mass.  No metastatic  disease. Cycle 15 zolbetuximab 09/09/2024, oxaliplatin  held due to neuropathy Cycle 16 zolbetuximab 09/30/2024, oxaliplatin  held due to neuropathy Cycle 17 zolbetuximab 10/21/2024,  oxaliplatin  held due to neuropathy 11/03/2024 CT abdomen/pelvis: Diffuse mild wall thickening of the distal gastric body and antrum-stable, mild increase in a right gastric lymph node small volume ascites without omental nodularity, proximal jejunal mural thickening 11/04/2024 upper GI: Narrowing of the gastric antrum, normal gastric emptying, dilated duodenum and proximal jejunum without evidence of a bowel obstruction 11/18/2024 upper endoscopy: Tumor in the gastric body and antrum with antral narrowing, dilated duodenum without evidence of mass or obstruction, esophagitis: Biopsy of the gastric mass-poorly differentiated adenocarcinoma with focal signet ring morphology, esophagus biopsy: Squamous mucosa with focal ulceration Cycle 1 weekly cisplatin  11/26/2024 Cycle 2 weekly cisplatin  12/07/2024 Cycle 3 weekly cisplatin  12/15/2024 12/12/2024 CTs: Pneumomediastinum extending into the neck, lower lobe consolidation, esophagus distention, air in the bladder, new mildly enlarged right hilar node, new small volume ascites Early satiety, postprandial abdominal pain, weight loss secondary to #1 History of bilateral lower extremity DVT 2021-anticoagulation discontinued due to massive retroperitoneal bleed, IVC filter placed 08/24/2020 Hospitalized with acute respiratory failure due to COVID-19 08/06/2020 - 09/04/2020 History of massive retroperitoneal bleed secondary to anticoagulation September 2021 Thyroid  ultrasound 01/13/2024-no abnormal nodule identified in the right lobe.  1.1 cm thyroid  isthmus nodule meets criteria for 1 year follow-up ultrasound. Admission 02/01/2024 with increased abdominal pain/ascites Hospitalization with altered mental status 02/05/2024 through 02/13/2024; question rare case of 5-FU psychosis.  Improved  02/16/2024.  Mental status at baseline 02/23/2024. Neutropenia 02/16/2024 Mucositis 02/16/2024.  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  Persistent cold sensitivity 05/20/2024, oxaliplatin  held.  Cold sensitivity resolved 06/03/2024, oxaliplatin  resumed.  Mild loss of vibratory sense, oxaliplatin  dose reduced 06/29/2024 Admission 11/28/2024 with intractable nausea and vomiting 11/27/2024 CT Abdo/pelvis: Bilateral lower lobe aspiration/pneumonia, thick-walled gastric body/antrum, dilated proximal small bowel, small volume pelvic ascites 11/30/2024 EGD: Malignant appearing severe stenosis at the gastric antrum-stent placed, dilated duodenum with retained fluid 15.  Anemia secondary to GI bleeding 12/12/2024 2 units RBCs, 12/16/2024   Mr Rolfe has metastatic gastric cancer.  There is evidence of disease progression with persistent/progressive tumor in the stomach and intractable nausea/vomiting.  The omental disease on presentation last year remains significantly improved, by CT criteria.  He responded well to initial treatment with oxaliplatin  based chemotherapy.  He has neuropathy symptoms.  He had psychosis following 5-fluorouracil .  He began a trial of salvage therapy with cisplatin  on 11/26/2024.  He completed cycle 3 on 12/15/2024.  He is now admitted with intractable nausea/vomiting, potentially related to a small bowel obstruction, ileus from the carcinomatosis, in addition to persistent tumor in the stomach.   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  There was retained fluid in the stomach and duodenum.  A gastric antral stent was placed.  He continues to have nausea and vomiting of liquids despite placement of the gastric antral stent.  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  He developed recurrent severe nausea and vomiting 12/08/2024.  The nausea has improved with octreotide , but remains significant.  He has intermittent hematemesis, likely secondary to the gastric mass.  GI does not recommend an endoscopy.  The hemoglobin is lower yesterday.  He received 2 units of packed red blood cells yesterday.  I will ask radiation oncology to consider palliative radiation to the gastric mass in an attempt to alleviate hematemesis.  Mr Trela has developed CT evidence of  progressive aspiration pneumonia.  He also has pneumomediastinum with subcutaneous emphysema extending to the neck.  The significance of the emphysematous findings is unclear.  I reviewed the CT images.  He may have a tumor related microperforation or perforation related to emesis.  Mr Rouillard has been evaluated by the palliative care service.  The family met with palliative care yesterday and is scheduled to meet again today.  They understand the poor prognosis.  He wants to continue treatment.  We submitted tissue for molecular testing to look for options for targeted treatment and assess the tumor mutation burden.  Recommendations: Continue TPN, IV fluids when off of TPN Continue octreotide , Phenergan , and Ativan  Antibiotics for aspiration pneumonia Palliative care to meet with the family today Transfuse packed red blood cells as needed for severe or symptomatic anemia Radiation oncology consult to consider palliative radiation to the stomach-I contacted Dr. Patrcia Please call oncology as needed.  I will be out until 12/24/2024.  Dr. Timmy will follow him beginning 12/20/2024.   LOS: 19 days   Arley Hof, MD   12/17/2024, 8:18 AM

## 2024-12-17 NOTE — Consult Note (Signed)
 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    ICD-10-CM   1. SBO (small bowel obstruction) (HCC)  K56.609     2. Malignant neoplasm of stomach, unspecified location (HCC)  C16.9 CISplatin  (PLATINOL ) 66 mg in sodium chloride  0.9 % 250 mL chemo infusion    0.9 % NaCl with KCl 20 mEq/ L  infusion    magnesium  sulfate IVPB 2 g 50 mL    palonosetron  (ALOXI ) injection 0.25 mg    fosaprepitant  (EMEND) 150 mg in sodium chloride  0.9 % 145 mL IVPB    dexamethasone  (DECADRON ) injection 10 mg    Infusion Appointment Request    Clinic Appointment Request    Lab Appointment Request    CBC with Differential (Cancer Center Only)    Basic Metabolic Panel - Cancer Center Only    Magnesium     0.9 % NaCl with KCl 20 mEq/ L  infusion    magnesium  sulfate IVPB 2 g 50 mL    palonosetron  (ALOXI ) injection 0.25 mg    fosaprepitant  (EMEND) 150 mg in sodium chloride  0.9 % 145 mL IVPB    dexamethasone  (DECADRON ) injection 10 mg    CISplatin  (PLATINOL ) 66 mg in sodium chloride  0.9 % 250 mL chemo infusion    DISCONTINUED: LORazepam  (ATIVAN ) tablet 0.5 mg    DISCONTINUED: promethazine  (PHENERGAN ) tablet 6.25 mg    3. Coffee ground emesis  K92.0     4. Aspiration pneumonia due to gastric secretions, unspecified laterality, unspecified part of lung (HCC)  J69.0     5. Small bowel obstruction (HCC)  K56.609 DG Abd Portable 1V-Small Bowel Obstruction Protocol-initial, 8 hr delay    DG Abd Portable 1V-Small Bowel Obstruction Protocol-initial, 8 hr delay    6. 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 male seen at the request of Dr. Cloretta. He has a history of gastric cancer dating back to 11/2023 when he was found to have a lesion involving the gastric body on EGD on 11/21/23 performed in [place]. Staging CT scans performed on 11/28/23 showed lymph nodes up to 6 mm near the stomach wall. When he returned to [facility], he underwent upper endoscopy on 12/31/23 under the care of Dr. Charlanne and confirmed a mass in the gastric body located within 2 cm of the GE junction. Biopsies were taken from the mass and final pathology revealed poorly differentiated adenocarcinoma with signet ring cells. A staging PET scan on 01/07/24 confirmed hypermetabolic lymph nodes in the gastrohepatic ligament, as well as hypermetabolic gastric mucosal thickening. Additionally noted was hypermetabolic activity associated with the right lobe of the thyroid  gland. A biopsy of the omental/peritoneal thickening was performed on 01/22/24, as well as paracentesis the same day, both of which confirmed malignancy related to the patient's gastric cancer. He was started on systemic chemotherapy with FOLFOX under the care of Dr. Cloretta on 02/02/24. 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 Of note, the oxaliplatin  was held from cycle 12 due to neuropathy. A repeat upper endoscopy on 11/18/24 showed persistent tumor in the gastric body and antrum, thus his treatment was changed to cisplatin  on 11/26/24.   Most recently, he presented to the ED on 11/27/24 with intractable nausea and vomiting with hematemesis. He was found to have a possible small bowel obstruction and underwent repeat EGD with gastric stent placement and placement of NGT on 11/30/24, under the care of Dr. Charlanne. He has continued cisplatin  during hospitalization, with cycle 2  given on 12/07/24 and cycle 3 on 12/15/24.Unfortunately, he has continued with intractable N/V and hematemesis so we have been asked to consult the patient regarding potential palliative radiation to alleviate the tumor bleeding.  PREVIOUS RADIATION THERAPY: No  PAST MEDICAL HISTORY:  Past Medical History:  Diagnosis Date   Acute respiratory failure (HCC) 08/06/2020   Chronic cough    SINCE 08-06-2020 COVID PNEUMONIA   COVID 08/06/2020   Dvt femoral (deep venous thrombosis) (HCC) 08/2020   LEFT LEG    GERD (gastroesophageal reflux disease)    History of blood transfusion 08/2020   AFTER MI 5 UNITS GIVEN   History of kidney stones    Hypertension    Numbness    LEFT LEG AT TIMES   Pneumonia 08/06/2020   COVID PNEUMONIA      PAST SURGICAL HISTORY: Past Surgical History:  Procedure Laterality Date   CYSTOSCOPY WITH STENT PLACEMENT Left 10/06/2020   Procedure: CYSTOSCOPY WITH STENT PLACEMENT;  Surgeon: Devere Lonni Righter, MD;  Location: [facility];  Service: Urology;  Laterality: Left;   CYSTOSCOPY/URETEROSCOPY/HOLMIUM LASER/STENT PLACEMENT Left 11/14/2020   Procedure: CYSTOSCOPY/URETEROSCOPY/HOLMIUM LASER/STENT PLACEMENT;  Surgeon: Devere Lonni Righter, MD;  Location: [facility];  Service: Urology;  Laterality: Left;  ONLY NEEDS 45 MIN   DUODENAL STENT PLACEMENT N/A 11/30/2024   Procedure: INSERTION, STENT, DUODENUM;  Surgeon: Wilhelmenia Aloha Raddle., MD;  Location: [facility];  Service: Gastroenterology;  Laterality: N/A;   ESOPHAGOGASTRODUODENOSCOPY N/A 11/30/2024   Procedure: EGD (ESOPHAGOGASTRODUODENOSCOPY);  Surgeon: Wilhelmenia Aloha Raddle., MD;  Location: [facility];  Service: Gastroenterology;  Laterality: N/A;  with possible enteral stent   IR IMAGING GUIDED PORT INSERTION  01/22/2024   IR IVC FILTER PLMT / S&I /IMG GUID/MOD SED  08/24/2020   IR PARACENTESIS  01/22/2024    FAMILY HISTORY:  Family History  Problem Relation Age of Onset   Cancer Mother        type  unknown   Diabetes Mellitus II Neg Hx    Colon cancer Neg Hx    Esophageal cancer Neg Hx    Rectal cancer Neg Hx    Stomach cancer Neg Hx     SOCIAL HISTORY:  Social History   Socioeconomic History   Marital status: Married    Spouse name: Not on file   Number of children: 3   Years of education: Not on file   Highest education level: Not on file  Occupational History   Not on file  Tobacco Use   Smoking status: Former    Current packs/day: 0.00    Types: Cigarettes    Quit date: 1986    Years since quitting: 40.0    Passive exposure: Never   Smokeless tobacco: Never  Vaping Use   Vaping status: Never Used  Substance and Sexual Activity   Alcohol use: Yes    Comment: occasional   Drug use: Never   Sexual  activity: Yes  Other Topics Concern   Not on file  Social History Narrative   Not on file   Social Drivers of Health   Tobacco Use: Medium Risk (11/30/2024)   Patient History    Smoking Tobacco Use: Former    Smokeless Tobacco Use: Never    Passive Exposure: Never  Physicist, Medical Strain: Not on file  Food Insecurity: Patient Declined (11/28/2024)   Epic    Worried About Programme Researcher, Broadcasting/film/video in the Last Year: Patient declined    Barista in the Last Year: Patient declined  Transportation Needs: Patient Declined (11/28/2024)   Epic    Lack of Transportation (Medical): Patient declined    Lack of Transportation (Non-Medical): Patient declined  Physical Activity: Not on file  Stress: Not on file  Social Connections: Unknown (04/30/2022)   Received from [facility]   Social Network    Social Network: Not on file  Intimate Partner Violence: Patient Declined (11/28/2024)   Epic    Fear of Current or Ex-Partner: Patient declined    Emotionally Abused: Patient declined    Physically Abused: Patient declined    Sexually Abused: Patient declined  Depression (PHQ2-9): Medium Risk (11/26/2024)   Depression (PHQ2-9)    PHQ-2 Score: 5  Alcohol Screen:  Not on file  Housing: Unknown (11/28/2024)   Epic    Unable to Pay for Housing in the Last Year: Patient declined    Number of Times Moved in the Last Year: 0    Homeless in the Last Year: Patient declined  Utilities: Patient Declined (11/28/2024)   Epic    Threatened with loss of utilities: Patient declined  Health Literacy: Not on file    ALLERGIES: Fluorouracil   MEDICATIONS:  Current Facility-Administered Medications  Medication Dose Route Frequency Provider Last Rate Last Admin   acetaminophen  (TYLENOL ) tablet 650 mg  650 mg Oral Q6H PRN Fausto Sor A, DO   650 mg at 12/11/24 9478   Or   acetaminophen  (TYLENOL ) suppository 650 mg  650 mg Rectal Q6H PRN Fausto Sor A, DO       alum & mag hydroxide-simeth (MAALOX/MYLANTA) 200-200-20 MG/5ML suspension 30 mL  30 mL Oral Q4H PRN Raenelle Coria, MD   30 mL at 12/08/24 1627   Ampicillin -Sulbactam (UNASYN ) 3 g in sodium chloride  0.9 % 100 mL IVPB  3 g Intravenous Q6H Gonfa, Taye T, MD 200 mL/hr at 12/17/24 0826 3 g at 12/17/24 0826   Chlorhexidine  Gluconate Cloth 2 % PADS 6 each  6 each Topical Daily Fausto Sor A, DO   6 each at 12/17/24 1002   guaiFENesin -dextromethorphan  (ROBITUSSIN DM) 100-10 MG/5ML syrup 5 mL  5 mL Oral Q4H PRN Gonfa, Taye T, MD   5 mL at 12/13/24 0214   insulin  aspart (novoLOG ) injection 0-15 Units  0-15 Units Subcutaneous Q6H Swayne, Mary M, [facility]   2 Units at 12/17/24 9392   labetalol  (NORMODYNE ) injection 10 mg  10 mg Intravenous Q2H PRN Gonfa, Taye T, MD       LORazepam  (ATIVAN ) injection 0.5 mg  0.5 mg Intravenous Q6H PRN Cloretta Arley NOVAK, MD   0.5 mg at 12/17/24 9392   menthol  (CEPACOL) lozenge 3 mg  1 lozenge Oral PRN Tammy Sor, PA-C       octreotide  (SANDOSTATIN ) 500 mcg in sodium chloride  0.9 % 250 mL (2 mcg/mL) infusion  50 mcg/hr Intravenous Continuous Gonfa, Taye T, MD 25 mL/hr at 12/17/24 0821 50 mcg/hr at 12/17/24 [phone]  Oral care mouth rinse  15 mL Mouth Rinse 4 times per day Raenelle Coria, MD   15 mL at 12/17/24 9171   Oral care mouth rinse  15 mL Mouth Rinse PRN Raenelle Coria, MD       pantoprazole  (PROTONIX ) injection 40 mg  40 mg Intravenous Q12H Jerrol Agent, MD   40 mg at 12/17/24 1002   phenol (CHLORASEPTIC) mouth spray 1 spray  1 spray Mouth/Throat PRN Tammy Sor, PA-C   1 spray at 12/07/24 2132   promethazine  (PHENERGAN ) 25 mg in sodium chloride  0.9 % 50 mL IVPB  25 mg Intravenous Q6H PRN Gonfa, Taye T, MD 150 mL/hr at 12/17/24 0514 25 mg at 12/17/24 0514   sodium chloride  flush (NS) 0.9 % injection 10-40 mL  10-40 mL Intracatheter PRN Fausto Sor A, DO       sucralfate  (CARAFATE ) 1 GM/10ML suspension 1 g  1 g Oral TID Kennedy-Smith, Colleen M, NP   1 g at 12/17/24 1001   TPN ADULT (ION)   Intravenous Continuous TPN Swayne, Mary M, [facility] 85 mL/hr at 12/16/24 1754 Infusion Verify at 12/16/24 1754   TPN ADULT (ION)   Intravenous Continuous TPN Nicholaus Quarry, RPH       Facility-Administered Medications Ordered in Other Encounters  Medication Dose Route Frequency Provider Last Rate Last Admin   sodium chloride  flush (NS) 0.9 % injection 10 mL  10 mL Intravenous PRN Cloretta Arley NOVAK, MD   10 mL at 02/16/24 1129    REVIEW OF SYSTEMS:  On review of systems, the patient reports that he is doing fair in general. He denies any chest pain, increased shortness of breath, fevers, chills, or night sweats.  He has had generalized fatigue/malaise and consistent weight loss over the last few months.  He does have a new cough and evidence of progressive aspiration pneumonia, and pneumomediastinum with subcutaneous emphysema extending to the neck on recent CT chest.  He denies any bowel or bladder disturbances, and denies abdominal pain.  He has continued with nausea, vomiting and hematemesis but this seems to be better controlled since resuming the octreotide  along with Phenergan  and Ativan .  He denies any new musculoskeletal or joint aches or pains. A complete review of systems  is obtained and is otherwise negative.     PHYSICAL EXAM:  Wt Readings from Last 3 Encounters:  12/17/24 174 lb 13.2 oz (79.3 kg)  11/26/24 158 lb 1.6 oz (71.7 kg)  11/22/24 163 lb 12.8 oz (74.3 kg)   Temp Readings from Last 3 Encounters:  12/17/24 98.3 F (36.8 C) (Oral)  11/26/24 98.2 F (36.8 C) (Temporal)  11/26/24 97.8 F (36.6 C)   BP Readings from Last 3 Encounters:  12/17/24 121/75  11/26/24 (!) 150/88  11/26/24 (!) 129/92   Pulse Readings from Last 3 Encounters:  12/17/24 85  11/26/24 69  11/26/24 92   Pain Assessment Pain Score: 0-No pain/10  IIn general this is a chronically ill appearing Caucasian man in no acute distress. He's alert and oriented x4 and appropriate throughout the examination. Cardiopulmonary assessment is negative for acute distress and he exhibits normal effort.     KPS = 30  100 - Normal; no complaints; no evidence of disease. 90   - Able to carry on normal activity; minor signs or symptoms of disease. 80   - Normal activity with effort; some signs or symptoms of disease. 87   - Cares for self; unable to carry on normal activity or  to do active work. 60   - Requires occasional assistance, but is able to care for most of his personal needs. 50   - Requires considerable assistance and frequent medical care. 40   - Disabled; requires special care and assistance. 30   - Severely disabled; hospital admission is indicated although death not imminent. 20   - Very sick; hospital admission necessary; active supportive treatment necessary. 10   - Moribund; fatal processes progressing rapidly. 0     - Dead  Karnofsky DA, Abelmann WH, Craver LS and Burchenal [facility] [phone]) The use of the nitrogen mustards in the palliative treatment of carcinoma: with particular reference to bronchogenic carcinoma Cancer 1 634-56  LABORATORY DATA:  Lab Results  Component Value Date   WBC 4.5 12/17/2024   HGB 9.2 (L) 12/17/2024   HCT 27.8 (L) 12/17/2024   MCV 90.6  12/17/2024   PLT 272 12/17/2024   Lab Results  Component Value Date   NA 137 12/17/2024   K 4.0 12/17/2024   CL 103 12/17/2024   CO2 25 12/17/2024   Lab Results  Component Value Date   ALT 56 (H) 12/16/2024   AST 29 12/16/2024   ALKPHOS 133 (H) 12/16/2024   BILITOT 0.4 12/16/2024     RADIOGRAPHY: CT CHEST ABDOMEN PELVIS W CONTRAST Result Date: 12/13/2024 EXAM: CT CHEST, ABDOMEN AND PELVIS WITH CONTRAST 12/12/2024 02:52:35 PM TECHNIQUE: CT of the chest, abdomen and pelvis was performed with the administration of 100 mL of iohexol  (OMNIPAQUE ) 300 MG/ML solution. Multiplanar reformatted images are provided for review. Automated exposure control, iterative reconstruction, and/or weight based adjustment of the mA/kV was utilized to reduce the radiation dose to as low as reasonably achievable. COMPARISON: Portable chest from 12/11/2024, portable abdomen 12/10/2024, portable chest 12/09/2024, most recent abdomen and pelvis CT with contrast 11/27/2024, 11/02/2024, and 09/01/2024; and PET CT 01/07/2024. CLINICAL HISTORY: Gastric cancer, monitor; Patient with fever, subcutaneous emphysema and carcinomatosis, rule out retroperitoneal perforation. History of EGD 11/30/2024 with insertion of a gastroduodenal stent. FINDINGS: CHEST: MEDIASTINUM AND LYMPH NODES: Right IJ port catheter terminating at the superior cavoatrial junction. Heart and pericardium are unremarkable. The central airways are clear. There are a few slightly prominent bilateral hilar lymph nodes up to 1 cm in short axis, probably reactive, new from the PET CT. There is new enlargement of a right hilar lymph node just inferior to the right main bronchus measuring 1.2 x 1.8 cm. No mediastinal adenopathy is seen. Heterogeneous 1.6 cm nodule of the posterior right lobe of the thyroid  was evaluated with thyroid  ultrasound 01/13/2024. This is unchanged in size by CT, but could not be located on ultrasound. Consider repeat ultrasound if clinically  warranted. Once again, the esophagus is distended with refluxed or retained fluid nearly to the thoracic inlet. There is retained mucoid debris in the trachea to the right. The main bronchi are patent. There is a significant aspiration risk due to the esophageal fluid distention. Aspiration precautions are recommended unless already being done. LUNGS AND PLEURA: There is scarring in the lungs without a specific zonal predilection, most likely post-COVID type scarring, with patchy areas of predominantly subpleural coarse interstitial change and ground glass and areas of architectural distortion. There is patchy consolidation in the lower lobes, and scattered opacities in the posterior left upper lobe consistent with pneumonia or aspiration. This finding has increased especially in the left lower lobe since 11/27/2024. No other focal infiltrate is seen. No pleural effusion or pneumothorax. ABDOMEN AND PELVIS: LIVER: The  liver is mildly steatotic without evidence of metastasis. GALLBLADDER AND BILE DUCTS: Gallbladder is unremarkable. No biliary ductal dilatation. SPLEEN: No acute abnormality. PANCREAS: No acute abnormality. ADRENAL GLANDS: No adrenal mass. KIDNEYS, URETERS AND BLADDER: There are stones measuring 6 mm and 4 mm in the right kidney collecting system. No left nephrolithiasis or hydronephrosis. No perinephric or periureteral stranding. There is air in the anterior bladder, which could be due to catheterization, but there is air in the bladder base in the wall consistent with emphysematous cystitis. This was not seen previously. Urinalysis and appropriate treatment is recommended. GI AND BOWEL: Circumferential gastric thickening continues to be seen, with adjacent stranding change. Antropyloric stenting has been performed. There is continued dilatation in the duodenum, maximum 6 cm caliber without an appreciable transition. There are thickened jejunal folds consistent with nonspecific enteritis. The small  bowel is otherwise normal caliber. The appendix is normal. No evidence of focal colitis or diverticulitis. There is a small volume of ascites. REPRODUCTIVE ORGANS: No acute abnormality. PERITONEUM AND RETROPERITONEUM: No free air. VASCULATURE: Aorta is normal in caliber. There is an IVC filter. No other significant vascular findings. ABDOMINAL AND PELVIS LYMPH NODES: No lymphadenopathy. BONES AND SOFT TISSUES: There is multilevel bridging enthesopathy of the thoracic spine. No thoracic bone metastasis is seen. There are mild degenerative changes of the lumbar spine. Since 11/27/2024, there is patchy substernal pneumomediastinum, source indeterminate. This extends cephalad to the supraclavicular fossae and perivascular regions of the neck. IMPRESSION: 1. New patchy substernal pneumomediastinum of indeterminate source, extending into the neck. 2. Patchy consolidation in the lower lobes and scattered opacities in the posterior left upper lobe consistent with pneumonia or aspiration, increased since 11/27/2024. 3. Esophageal distention with retained/refluxed fluid to the thoracic inlet, with significant aspiration risk; aspiration precautions are recommended. 4. 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 5. Circumferential gastric thickening with adjacent stranding change and antropyloric stenting, with continued duodenal dilatation (maximum 6 cm) without transition and thickened jejunal folds consistent with nonspecific enteritis. 6. New mildly enlarged right hilar lymph node and a few slightly prominent bilateral hilar lymph nodes; no evidence of metastatic disease. 7. Right-sided nephrolithiasis. 8. Small volume of ascites, new. 9. These results will be telephoned to the referring provider or the referring providers representative by professional radiology assistant [facility]) personnel, with communication documented in the [facility] dashboard. Electronically signed by:  Francis Quam MD 12/13/2024 02:46 AM EST RP Workstation: [id number]   DG Chest Port 1 View Result Date: 12/11/2024 CLINICAL DATA:  Hypoxemia. EXAM: PORTABLE CHEST 1 VIEW COMPARISON:  December 09, 2024 FINDINGS: There is stable right-sided venous Port-A-Cath positioning. The heart size and mediastinal contours are within normal limits. Mild infiltrate is seen within the left lung base which represents a new finding when compared to the prior study. Mild, stable areas of airspace disease are seen within the right infrahilar region and lateral aspect of the right lung base. No pleural effusion or pneumothorax is identified. A mild amount of supraclavicular soft tissue air is noted on the left. This is stable in appearance when compared to the prior study. The visualized skeletal structures are unremarkable. IMPRESSION: 1. Mild left basilar infiltrate. 2. Stable right infrahilar and right basilar airspace disease. 3. Stable left supraclavicular soft tissue air. Electronically Signed   By: Suzen Dials M.D.   On: 12/11/2024 11:34   DG Abd Portable 1V Result Date: 12/11/2024 CLINICAL DATA:  Hematemesis. EXAM: PORTABLE ABDOMEN - 1 VIEW COMPARISON:  12/02/2024 FINDINGS: No  gaseous small bowel dilatation to suggest obstruction. Colon is nondilated. Contrast material is seen scattered along the length of the colon from the cecum to the distal descending segment. Gastric stent device is overlies the T12 vertebral body compatible with placement in the region of the gastric antrum and stable since chest x-ray of 12/09/2024. IMPRESSION: 1. Nonobstructive bowel gas pattern. 2. Gastric stent device overlies the T12 vertebral body consistent with placement in the gastric antrum and stable since chest x-ray of 12/09/2024. Electronically Signed   By: Camellia Candle M.D.   On: 12/11/2024 07:10   DG Chest Port 1 View Result Date: 12/09/2024 EXAM: 1 VIEW(S) XRAY OF THE CHEST 12/09/2024 09:35:00 AM COMPARISON: 11/27/2024  cxr , ct abd/pelvis 11/27/24 CLINICAL HISTORY: Shortness of breath FINDINGS: LINES, TUBES AND DEVICES: Accessed right Port-A-Cath in place with tip just distal to the cavoatrial junction. Right upper quadrant stent noted. IVC filter noted. LUNGS AND PLEURA: Persistent right middle and lower lobe opacities. No pleural effusion. No pneumothorax. HEART AND MEDIASTINUM: No acute abnormality of the cardiac and mediastinal silhouettes. BONES AND SOFT TISSUES: Subcutaneous emphysema over the lower neck. No acute osseous abnormality. IMPRESSION: 1. Persistent right middle and lower lobe opacities. 2. Subcutaneous emphysema over the lower neck/shoulder. Electronically signed by: Morgane Naveau MD 12/09/2024 01:46 PM EST RP Workstation: [id number]   DG Abd Portable 1V Result Date: 12/02/2024 CLINICAL DATA:  Small bowel obstruction. EXAM: PORTABLE ABDOMEN - 1 VIEW COMPARISON:  Abdominal radiograph dated 12/01/2024. FINDINGS: Partially visualized enteric tube with tip in the proximal stomach and side port at the GE junction. Recommend further advancing by additional 7 cm. No bowel dilatation or evidence of obstruction. Retained stool mixed with contrast noted throughout the colon. No free air.An IVC filter is noted. No acute osseous pathology. IMPRESSION: 1. No bowel dilatation or evidence of obstruction. 2. Enteric tube with tip in the proximal stomach and side port at the GE junction. Recommend further advancing by additional 7 cm. Electronically Signed   By: Vanetta Chou M.D.   On: 12/02/2024 19:37   DG Abd Portable 1V-Small Bowel Obstruction Protocol-initial, 8 hr delay Result Date: 12/01/2024 EXAM: 1 VIEW XRAY OF THE ABDOMEN 12/01/2024 10:19:00 PM COMPARISON: 11/29/2024 CLINICAL HISTORY: FINDINGS: LINES, TUBES AND DEVICES: Enteric tube in place, not well visualized distally. IVC filter in place. Stent in right upper quadrant. BOWEL: Nonobstructive bowel gas pattern. Stable contrast material is noted throughout  the colon, unchanged from the prior exam. Recently administered contrast lies within mildly dilated loops of small bowel. 24-hour film is recommended. SOFT TISSUES: No abnormal calcifications. BONES: No acute fracture. IMPRESSION: 1. Mildly dilated loops of small bowel with recently administered contrast; 24-hour film is recommended. 2. Stable contrast material throughout the colon, unchanged from the prior exam. Electronically signed by: Oneil Devonshire MD 12/01/2024 11:47 PM EST RP Workstation: [id number]   DG C-Arm 1-60 Min-No Report Result Date: 11/30/2024 Fluoroscopy was utilized by the requesting physician.  No radiographic interpretation.   DG Abd 2 Views Result Date: 11/29/2024 EXAM: 2 VIEW XRAY OF THE ABDOMEN 11/29/2024 07:10:00 AM COMPARISON: None available. CLINICAL HISTORY: SBO (small bowel obstruction) (HCC) FINDINGS: LINES, TUBES AND DEVICES: IVC filter present. Partially visualized central venous catheter with tip overlying the superior cavoatrial junction. BOWEL: Enteric contrast material is again noted within the colon up to the level of the rectum. The appearance is unchanged compared with the previous exam without significant antegrade progression. Air-filled loops of redundant sigmoid colon noted within the left upper quadrant of  the abdomen, unchanged. Paucity of small bowel gas limits evaluation for obstruction. SOFT TISSUES: No abnormal calcifications. BONES: No acute fracture. IMPRESSION: 1. Paucity of small bowel gas limits evaluation for obstruction. 2. No significant change in the appearance of contrast material within the colon up to the rectum with air-filled loops of sigmoid colon in the left upper quadrant of the abdomen. Electronically signed by: Waddell Calk MD 11/29/2024 07:26 AM EST RP Workstation: [id number]   CT ABDOMEN PELVIS W CONTRAST Result Date: 11/27/2024 EXAM: CT ABDOMEN AND PELVIS WITH CONTRAST 11/27/2024 09:19:48 PM TECHNIQUE: CT of the abdomen and pelvis was  performed with the administration of 100 mL of iohexol  (OMNIPAQUE ) 300 MG/ML solution. Multiplanar reformatted images are provided for review. Automated exposure control, iterative reconstruction, and/or weight-based adjustment of the mA/kV was utilized to reduce the radiation dose to as low as reasonably achievable. COMPARISON: 11/02/2024 CLINICAL HISTORY: Abd pain, coffee ground emesis, elevated lipase, hx of gastric cancer FINDINGS: LOWER CHEST: Scarring with superimposed patchy peribronchovascular nodularity in the bilateral lower lobes, right greater than left (image 9), suggesting mild aspiration/pneumonia. LIVER: The liver is unremarkable. GALLBLADDER AND BILE DUCTS: Gallbladder is unremarkable. No biliary ductal dilatation. SPLEEN: No acute abnormality. PANCREAS: No acute abnormality. ADRENAL GLANDS: No acute abnormality. KIDNEYS, URETERS AND BLADDER: 5 mm nonobstructing right lower pole renal calculus. No stones in the ureters. No hydronephrosis. No perinephric or periureteral stranding. Urinary bladder is unremarkable. GI AND BOWEL: Fluid within the distal esophagus. Thick walled gastric body/antrum (image 17), chronic, without definite focal mass, although there is mild nodularity along the pylorus (image 21). Dilated proximal small bowel (image 32), suggesting small bowel obstruction, although without focal transition point. PERITONEUM AND RETROPERITONEUM: Small volume pelvic ascites. No free air. VASCULATURE: Aorta is normal in caliber. IVC filter. LYMPH NODES: No lymphadenopathy. REPRODUCTIVE ORGANS: The prostate is unremarkable. BONES AND SOFT TISSUES: Mild degenerative changes of the visualized thoracolumbar spine. No acute osseous abnormality. No focal soft tissue abnormality. IMPRESSION: 1. Dilated proximal small bowel, suggesting small bowel obstruction, although without a discrete transition point. 2. Thick-walled gastric body/antrum in this patient with history of gastric cancer, without  definite focal mass, with mild nodularity along the pylorus. 3. Scarring with superimposed patchy peribronchovascular nodularity in the bilateral lower lobes, right greater than left, suggesting mild aspiration/pneumonia. 4. Additional ancillary findings, as above. Electronically signed by: Pinkie Pebbles MD 11/27/2024 09:26 PM EST RP Workstation: [id number]   DG Chest Portable 1 View Result Date: 11/27/2024 EXAM: 1 VIEW(S) XRAY OF THE CHEST 11/27/2024 07:47:51 PM COMPARISON: 02/05/2024 CLINICAL HISTORY: coffee ground emesis, weakness FINDINGS: LINES, TUBES AND DEVICES: Right IJ port catheter in place. LUNGS AND PLEURA: Patchy airspace opacities in right mid lung. No pleural effusion. No pneumothorax. HEART AND MEDIASTINUM: No acute abnormality of the cardiac and mediastinal silhouettes. BONES AND SOFT TISSUES: Thoracic spondylosis. IMPRESSION: 1. Patchy airspace opacities in the right mid lung, which may reflect aspiration or infection. Electronically signed by: Greig Pique MD 11/27/2024 07:51 PM EST RP Workstation: [id number]      IMPRESSION/PLAN: 1. 59 y.o. man with hematemesis secondary to metastatic gastric cancer that has resulted in blood loss anemia.  Today, we talked to the patient and family about the findings and workup thus far. We discussed the natural history of gastric adenocarcinoma and general treatment, highlighting the role of palliative radiotherapy in the management of hematemesis. We discussed the available radiation techniques, and focused on the details and logistics of delivery.  The recommendation is for a 10 fraction  course of daily palliative radiation to the bleeding gastric mass.  We reviewed the anticipated acute and late sequelae associated with radiation in this setting. The patient was encouraged to ask questions that were answered to his stated satisfaction.   At the end of our discussion, the patient is in agreement to proceed with the recommended 10 fraction course  of daily palliative radiation to the bleeding gastric mass.  He has freely signed written consent to proceed today and a copy of this document will be placed in his medical record.  We will proceed with CT simulation later this morning, in anticipation of delivering his first fraction of treatment this afternoon and then resuming treatment on Monday, 12/20/2024.  We discussed that the daily radiation can be continued on outpatient basis should he have significant improvement and be deemed stable for discharge prior to completion of the 10 fractions.  They appear to have a good understanding of his disease and our treatment recommendations which are of palliative intent so we will proceed with treatment planning accordingly.  We personally spent 70 minutes in this encounter including chart review, reviewing radiological studies, meeting face-to-face with the patient, entering orders and completing documentation.    Sabra MICAEL Rusk, PA-C    Donnice Barge, MD  [facility]  Radiation Oncology Direct Dial: [phone]  Fax: [phone] [email or web address]  Skype  [email or web address]   This document serves as a record of services personally performed by Donnice Barge, MD and Sabra Rusk, PA-C. It was created on their behalf by Izetta Neither, a trained medical scribe. The creation of this record is based on the scribe's personal observations and the provider's statements to them. This document has been checked and approved by the attending provider.         "

## 2024-12-17 NOTE — Progress Notes (Signed)
 PHARMACY - TOTAL PARENTERAL NUTRITION CONSULT NOTE   Indication: intolerance to enteral feeds  Patient Measurements: Height: 6' (182.9 cm) Weight: 79.3 kg (174 lb 13.2 oz) IBW/kg (Calculated) : 77.6 TPN AdjBW (KG): 70 Body mass index is 23.71 kg/m.  Assessment: 26 yoM admitted on 12/13 with intractable nausea and vomiting, dilated small bowel loops, and coffee-ground emesis. 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  He underwent EGD 12/16 showing esophagitis, gastric stensosis, stent placed, impaired peristalsis. Pharmacy was consulted to dose TPN on 12/20 for intolerance to enteral feeding.    Glucose / Insulin : No hx DM, no PTA meds. A1c 5.7% on 12/14 -BG goal <180. Range: 87-140 (4 units SSI/24 hrs) - Octreotide  infusion resumed 12/25 @ 50 mcg/hr (noted that Octreotide , especially at higher doses/frequencies, can cause glucose dysregulation) Electrolytes: All WNL, including CorrCa (9.1) Renal: SCr < 1, BUN 31 (elevated) Hepatic: AST/ALT trending down 29/56; alk phos trending down 133 - Trig 164 (12/23), 132 (12/29) WNL - albumin  low at 2.3, T.bili WNL Intake / Output mIVF: none - Urine output: 4x occurrence  - Emesis: 3x occurrence  - LBM: 12/31 GI Imaging: - 12/18 Abd xray: No bowel dilatation or evidence of obstruction. Retained gastric fluid. 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 Prosthesis placed. Duodenal dilation deformity  - 12/25 DG abd: nonobstructive bowel gas pattern - 12/27 DG abd: nonobstructive bowel gas pattern - 12/28 chest/abd CT: Esophageal distention. Nonspecific Enteritis. New mildly enlarged right hilar lymph node and a few slightly prominent bilateral hilar lymph nodes. New small volume of ascites  GI Surgeries / Procedures:  - 12/16 EGD: esophagitis  GI meds:   - Protonix  40mg  IV  q12h >> - Octreotide  50 sq q8hrs >> DC'd 12/24; resumed 12/25 @50mcg /hr   Central access: Implanted Port (01/22/24) TPN  start date: 12/20   Nutritional Goals: Goal TPN rate is 90 mL/hr and provides 118 g of protein and 2298 kcals per day)  RD Assessment: Estimated Needs Total Energy Estimated Needs: 2100-2450 kcals Total Protein Estimated Needs: 105-120 grams Total Fluid Estimated Needs: >/= 2.1L  Current Nutrition:  NPO except for ice chips, sips with meds or for comfort TPN  Plan:  Now TPN pending insurance approval for outpatient use GOC discussions ongoing with family 1/1: TPN transitioned back to continuous. Once pt is otherwise medically stable and no longer requiring octreotide  infusion, can transition back to cyclic TPN. This can be done in the outpatient setting if necessary. MD aware.  At 1800: TPN continuous infusion at goal rate of 90 mL/hr Electrolytes in TPN:  Na 100 mEq/L K 50 mEq/L - increased Ca 2 mEq/L Mg 5 mEq/L Phos 20 mmol/L Cl:Ac  to 1:1 Add standard MVI and trace elements to TPN Change CBG to mSSI q6h Monitor TPN labs on Mon/Thurs. Recheck electrolytes with AM labs tomorrow.    Lacinda Moats, PharmD Clinical Pharmacist  1/2/20267:42 AM

## 2024-12-17 NOTE — Progress Notes (Signed)
 " PROGRESS NOTE    Henry May  FMW:[id number] DOB: [birth year] DOA: 11/27/2024 PCP: [name], DO    No chief complaint on file.   Brief Narrative:  59 year old M with PMH of DVT with IVC filter, gastric cancer on chemo and esophagitis presented to [facility] due to nausea, vomiting and coffee-ground emesis 2 days after he started new chemotherapy, and admitted for intractable nausea and vomiting, dilated small bowel loops and coffee-ground emesis.  Recent EGD on 12/4 with LA grade D esophagitis without bleeding, and large infiltrative, sessile and ulcerated circumferential mass with contact of bleeding in gastric body and antrum with antral narrowing and luminal diameter of approximately 1.5 cm   In ED, stable vitals.  CBC and CMP without significant finding.  Lipase 171.  CT abdomen and pelvis showed dilated proximal small bowel suggesting SBO but without discrete transition point, thick walled gastric body/antrum without definite focal mass, mild nodularity along the pylorus, scarring with superimposed patchy peribronchovascular nodularity in the bilateral lower lobes right more than left suggesting mild aspiration or pneumonia.  Patient was started on antiemetics, IV fluid, PPI and admitted for further care.  Oncology, surgery, GI and palliative medicine consulted.   Patient underwent EGD and gastric stent placement on 12/16.  Was unable to tolerate diet.  Started on TPN.  Plan was for discharge with TPN once logistic in place but nausea and vomiting gotten worse after stopping octreotide  and Decadron .  Also having hemoptysis with drop in hemoglobin.  Restarted on octreotide  infusion.     GI reengaged and order CT chest, abdomen and pelvis that showed new patchy substernal pneumomediastinum of indeterminate source, patchy consolidation in lower lobes and posterior LUL, esophageal distention with retained/reflux fluid in thoracic inlet, emphysematous cystitis and circumferential  gastric thickening with adjacent stranding change, antropyloric stenting and continued duodenal dilation, thickening jejunal folds.    Assessment & Plan:   Principal Problem:   Hematemesis with nausea Active Problems:   Intractable nausea and vomiting   SBO (small bowel obstruction) (HCC)   Diabetes mellitus without complication (HCC)   Gastric cancer (HCC)   History of DVT (deep vein thrombosis)   Protein-calorie malnutrition, severe   Gastric outlet obstruction   Decreased hemoglobin   Subcutaneous emphysema   Fever, unspecified   Aspiration pneumonia due to gastric secretions (HCC)   Coffee ground emesis  #1 gastric cancer, gastric luminal narrowing and esophagitis/intractable nausea and vomiting/acute blood loss anemia due to hematemesis likely secondary to bleeding from gastric cancer - CT done initially with dilated small bowel without transition point. - Patient seen by GI underwent EGD and gastric stenting 11/30/2024. - Repeat CT angiogram chest abdomen and pelvis as noted above. - Status post transfusion total of 2 units PRBCs on 12/12/2024 with hemoglobin noted at 6.3 on 12/16/2024, patient status posttransfusion 2 units PRBCs (12/16/2024) with hemoglobin currently at 9.2.   - Patient denies any hematemesis. - Patient noted to have had 2 bowel movements the morning of 12/15/2024.  - IV Unasyn  resumed on 12/12/2023-patient currently on day 7/7. - Patient currently n.p.o. except ice chips and sips with meds. - Continue TPN per pharmacy. - Continue IV antiemetics of Phenergan , Zofran  and Ativan . - Octreotide  resumed 12/09/2024. - Continue IV PPI twice daily - Aspiration precautions. -Oncology recommending radiation oncology evaluation for palliative radiation to gastric mass. -GI, oncology, palliative care following.  2.  Gastric cancer  - Patient noted to have recently restarted on chemotherapy and being followed by Dr. Cloretta. -  Status post EGD with gastric stenting  11/30/2024. - Patient noted to have received cisplatin  chemo on 12/07/2024. - Repeat CT chest abdomen and pelvis was done as noted above. - Oncology/status post #3 cisplatin . - Oncology, palliative care following. - Palliative care met with family and underwent family meeting on 12/15/2024 and will meet again today, 12/18/2023.  - Patient noted with bout of emesis with blood in it early this morning, oncology consulted with radiation oncology for palliative radiation today.  3.  Aspiration pneumonitis/pneumonia -Initial chest x-ray done showed patchy airspace opacity in the right middle lobe. - Initial CT abdomen and pelvis with bibasilar infiltrate. - Patient noted to have completed 7 days of appropriate antibiotics. - Patient noted to have spiked a fever on 12/11/2024 with repeat chest x-ray showing stable right lung opacity/infiltrate. - Repeat CT chest abdomen and pelvis was done as noted above. - IV Unasyn  resumed on 12/11/2024. -Discontinue IV Unasyn  after today's dose. - Aspiration precautions - Wean oxygen  as able to.  4.  Pneumomediastinum -Noted on CT chest. - Felt could likely be secondary to retching and vomiting. - Follow-up.  5.  Diet-controlled diabetes with hyperglycemia -Hemoglobin A1c 5.7% (11/28/2024) - CBG noted at 140 this morning. - Continue TPN per pharmacy. - SSI.  6.  History of DVT -Status post IVC filter.  7.  Elevated lipase -Improved.  8.  Insomnia mainly due to intractable nausea and vomiting -IV Ativan  and Phenergan  as above. - Patient also on octreotide  per oncology.  9.  Lightheadedness/presyncope -Likely secondary to intractable nausea vomiting hematemesis and volume depletion. -Improved. - On TPN and IV fluids.  10.  Severe malnutrition - BMI of 22.1 kg/m. - Likely secondary to chronic illness of gastric cancer. - Patient with severe fat depletion, severe muscle depletion. - Patient on TPN. - RD following.   DVT prophylaxis:  SCDs Code Status: Full Family Communication: Updated patient, wife at bedside. Disposition: TBD  Status is: Inpatient Remains inpatient appropriate because: Severity of illness   Consultants:  Gastroenterology: Dr. Charlanne 11/28/2024 Palliative care: Dr. Antonette 11/28/2024 General Surgery: Dr. Curvin III 11/30/2024 Oncology: Dr. Cloretta Radiation oncology  Procedures:  CT abdomen and pelvis 11/27/2024 CT chest abdomen and pelvis 12/12/2024 Upper endoscopy per GI: Dr. Wilhelmenia 11/30/2024   Antimicrobials:  Anti-infectives (From admission, onward)    Start     Dose/Rate Route Frequency Ordered Stop   12/11/24 0900  Ampicillin -Sulbactam (UNASYN ) 3 g in sodium chloride  0.9 % 100 mL IVPB        3 g 200 mL/hr over 30 Minutes Intravenous Every 6 hours 12/11/24 0817     11/28/24 1200  Ampicillin -Sulbactam (UNASYN ) 3 g in sodium chloride  0.9 % 100 mL IVPB  Status:  Discontinued        3 g 200 mL/hr over 30 Minutes Intravenous Every 6 hours 11/28/24 1048 12/05/24 0909   11/28/24 0045  Ampicillin -Sulbactam (UNASYN ) 3 g in sodium chloride  0.9 % 100 mL IVPB        3 g 200 mL/hr over 30 Minutes Intravenous  Once 11/28/24 0042 11/28/24 0134         Subjective: Patient noted earlier this morning to have a large volume of emesis with blood in it.  Patient also noted to have a small volume of emesis with blood in it later on this morning.  Patient denies any chest pain, no shortness of breath, no abdominal pain.  Daughter at bedside.   Objective: Vitals:   12/16/24 1626 12/16/24 2051 12/17/24  0454 12/17/24 0500  BP: 115/79 116/73 121/75   Pulse: 63 72 85   Resp: 19 18    Temp: 97.6 F (36.4 C) 98.1 F (36.7 C) 98.3 F (36.8 C)   TempSrc: Oral Oral Oral   SpO2: 96% 95% 95%   Weight:    79.3 kg  Height:        Intake/Output Summary (Last 24 hours) at 12/17/2024 1254 Last data filed at 12/17/2024 0452 Gross per 24 hour  Intake 1182.91 ml  Output --  Net 1182.91 ml   Filed  Weights   12/15/24 0500 12/16/24 0500 12/17/24 0500  Weight: 76.9 kg 81.4 kg 79.3 kg    Examination:  General exam: NAD. Respiratory system: Lungs clear to auscultation bilaterally.  No wheezes, no crackles, no rhonchi.  Fair air movement.  Speaking in full sentences.  No use of accessory muscles of respiration.    Cardiovascular system: RRR no murmurs rubs or gallops.  No JVD.  No lower extremity edema. Gastrointestinal system: Abdomen soft, nontender, nondistended, positive bowel sounds.  No rebound.  No guarding.   Central nervous system: Alert and oriented. No focal neurological deficits. Extremities: Symmetric 5 x 5 power. Skin: No rashes, lesions or ulcers Psychiatry: Judgement and insight appear normal. Mood & affect appropriate.     Data Reviewed: I have personally reviewed following labs and imaging studies  CBC: Recent Labs  Lab 12/11/24 0337 12/12/24 0752 12/13/24 0304 12/13/24 0819 12/14/24 0408 12/15/24 0420 12/16/24 0423 12/16/24 1750 12/17/24 0341  WBC 5.2   < > 4.7  --  5.1 4.5 3.7*  --  4.5  NEUTROABS 4.6  --   --  3.5 3.9 3.3 3.0  --   --   HGB 8.3*   < > 8.3*  --  8.8* 7.5* 6.3* 9.2* 9.2*  HCT 25.8*   < > 25.5*  --  26.9* 23.2* 19.1* 27.9* 27.8*  MCV 94.9   < > 92.7  --  93.7 94.3 93.2  --  90.6  PLT 251   < > 196  --  210 209 210  --  272   < > = values in this interval not displayed.    Basic Metabolic Panel: Recent Labs  Lab 12/12/24 0446 12/13/24 0304 12/14/24 0408 12/15/24 0420 12/16/24 0423 12/17/24 0341  NA 140 144 138 137 135 137  K 4.0 4.0 4.3 4.3 4.8 4.0  CL 105 109 103 102 104 103  CO2 28 29 24 26 23 25   GLUCOSE 259* 154* 155* 161* 223* 131*  BUN 33* 29* 26* 28* 28* 31*  CREATININE 0.69 0.60* 0.70 0.66 0.57* 0.66  CALCIUM  8.1* 8.0* 8.1* 8.0* 7.8* 7.9*  MG 2.0 2.0 2.1  --  2.4 2.1  PHOS 3.1 3.7 3.1 3.0 2.6 2.9    GFR: Estimated Creatinine Clearance: 107.8 mL/min (by C-G formula based on SCr of 0.66 mg/dL).  Liver Function  Tests: Recent Labs  Lab 12/12/24 0446 12/13/24 0304 12/14/24 0408 12/15/24 0420 12/16/24 0423  AST 14* 20 62* 34 29  ALT 32 51* 81* 65* 56*  ALKPHOS 94 103 161* 147* 133*  BILITOT 0.3 0.3 0.4 0.6 0.4  PROT 5.0* 5.1* 5.3* 5.4* 5.1*  ALBUMIN  2.3* 2.4* 2.5* 2.4* 2.3*    CBG: Recent Labs  Lab 12/16/24 1205 12/16/24 1622 12/16/24 2357 12/17/24 0510 12/17/24 1156  GLUCAP 101* 87 132* 140* 135*     No results found for this or any previous visit (from the past  240 hours).       Radiology Studies: No results found.      Scheduled Meds:  Chlorhexidine  Gluconate Cloth  6 each Topical Daily   insulin  aspart  0-15 Units Subcutaneous Q6H   mouth rinse  15 mL Mouth Rinse 4 times per day   pantoprazole  (PROTONIX ) IV  40 mg Intravenous Q12H   sucralfate   1 g Oral TID   Continuous Infusions:  ampicillin -sulbactam (UNASYN ) IV 3 g (12/17/24 0826)   octreotide  (SANDOSTATIN ) 500 mcg in sodium chloride  0.9 % 250 mL (2 mcg/mL) infusion 50 mcg/hr (12/17/24 0821)   promethazine  (PHENERGAN ) injection (IM or IVPB) 25 mg (12/17/24 1228)   TPN ADULT (ION) 85 mL/hr at 12/16/24 1754   TPN ADULT (ION)       LOS: 19 days    Time spent: 40 minutes    Toribio Hummer, MD [facility]   To contact the attending provider between 7A-7P or the covering provider during after hours 7P-7A, please log into the web site [email or web address] and access using universal [place] password for that web site. If you do not have the password, please call the hospital operator.  12/17/2024, 12:54 PM    "

## 2024-12-17 NOTE — Progress Notes (Addendum)
 "   Progress Note   LOS: 19 days   Chief Complaint:Gastric cancer/hematemesis    Subjective   Wife at bedside.  Patient sleeping.  Last bowel movement was 12/31.  Patient's wife reports 2 episodes of coughing with small amount of bleeding.  She also reports around 5:30 AM patient had epigastric pain and nausea and vomited a large amount of blood and felt better after that and has been sleeping since.  Tolerating popsicles without difficulty.  Plan to meet with palliative care again today and currently planning for radiation later today    Objective   Vital signs in last 24 hours: Temp:  [97.6 F (36.4 C)-98.3 F (36.8 C)] 98.3 F (36.8 C) (01/02 0454) Pulse Rate:  [57-85] 85 (01/02 0454) Resp:  [18-25] 18 (01/01 2051) BP: (112-130)/(65-90) 121/75 (01/02 0454) SpO2:  [94 %-97 %] 95 % (01/02 0454) Weight:  [79.3 kg] 79.3 kg (01/02 0500) Last BM Date : 12/11/25 Last BM recorded by nurses in past 5 days No data recorded  General:   male ill-appearing, sleeping Heart:  Regular rate and rhythm; no murmurs Pulm: Clear anteriorly; no wheezing Abdomen: soft, nondistended, normal bowel sounds in all quadrants. Nontender without guarding. No organomegaly appreciated. Extremities:  No edema Neurologic:  Alert and  oriented x4;  No focal deficits.  Psych:  Cooperative. Normal mood and affect.  Intake/Output from previous day: 01/01 0701 - 01/02 0700 In: 2286.1 [P.O.:50; I.V.:1024.1; Blood:712; IV Piggyback:500] Out: -  Intake/Output this shift: No intake/output data recorded.  Studies/Results: No results found.  Lab Results: Recent Labs    12/15/24 0420 12/16/24 0423 12/16/24 1750 12/17/24 0341  WBC 4.5 3.7*  --  4.5  HGB 7.5* 6.3* 9.2* 9.2*  HCT 23.2* 19.1* 27.9* 27.8*  PLT 209 210  --  272   BMET Recent Labs    12/15/24 0420 12/16/24 0423 12/17/24 0341  NA 137 135 137  K 4.3 4.8 4.0  CL 102 104 103  CO2 26 23 25   GLUCOSE 161* 223* 131*  BUN 28* 28* 31*   CREATININE 0.66 0.57* 0.66  CALCIUM  8.0* 7.8* 7.9*   LFT Recent Labs    12/16/24 0423  PROT 5.1*  ALBUMIN  2.3*  AST 29  ALT 56*  ALKPHOS 133*  BILITOT 0.4   PT/INR No results for input(s): LABPROT, INR in the last 72 hours.   Scheduled Meds:  Chlorhexidine  Gluconate Cloth  6 each Topical Daily   insulin  aspart  0-15 Units Subcutaneous Q6H   mouth rinse  15 mL Mouth Rinse 4 times per day   pantoprazole  (PROTONIX ) IV  40 mg Intravenous Q12H   sucralfate   1 g Oral TID   Continuous Infusions:  ampicillin -sulbactam (UNASYN ) IV 3 g (12/17/24 0826)   octreotide  (SANDOSTATIN ) 500 mcg in sodium chloride  0.9 % 250 mL (2 mcg/mL) infusion 50 mcg/hr (12/17/24 0821)   promethazine  (PHENERGAN ) injection (IM or IVPB) 25 mg (12/17/24 0514)   TPN ADULT (ION) 85 mL/hr at 12/16/24 1754   TPN ADULT (ION)        Patient profile:   Henry May is a 59 year old male with a past medical history of 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 Admitted 11/27/2024 with intractable nausea/vomiting/coffee-ground emesis with stable hemoglobin (Hb 14.5 - baseline).     CTAP 12/13 showed thick walled gastric body and antrum without a definitive mass with nodularity along the pylorus and  proximal dilated small bowel loops, questionable mild obstruction without a discrete transition point. S/P EGD with gastric stent placement 12/16. On TPN. Patient developed hematemesis 12/25 +/- hemoptysis. Octreotide  restarted. Repeat CTAP 12/28 showed circumferential gastric thickening with antropyloric stent with continued dilatation in the duodenum 6 cm caliber without an appreciable transition, thickened jejunal folds consistent with nonspecific enteritis and small volume of ascites.  Palliative care consulted, patient wishes to continue treatment for cancer.      Impression:   Stage IV gastric  adenocarcinoma Hematemesis Potential sources include tumor bleeding, erosive esophagitis, Mallory-Weiss tear.  Endoscopy very high risk with CT findings of pneumomediastinum and extensive discussion with Dr. San that if retroperitoneal air tracking is from malignant microperforation endoscopy risks outweigh benefits. Large amount of hematemesis 5:30 AM, planning for radiation oncology today per Dr. Cloretta - Limited role of endoscopy with possible malignant bleeding, risks outweigh benefits at this time - Continue TPN - Continue PPI - Continue Carafate  - Continue antiemetics - Continue popsicles and ice chips as tolerated (patient is high aspiration risk) - Receiving cisplatin  - Receiving radiation today per oncology   Normocytic anemia. Secondary to gastric adenocarcinoma and hematemesis.  S/p transfusion 2 units PRBCs 12/28 S/p transfusion 2 units PRBCs 1/1 Hgb 9.2 - Transfuse for Hg level < 7 or as needed if symptomatic    Possible aspiration pneumonitis/pneumonia.  CXR showed patchy airspace opacities in RML. CTAP showed bibasilar infiltrate. Treated with antibiotic x 7 days. Subcutaneous air seen on chest x-rays 12/25.  Chest CT 12/28 identified a few new prominent bilateral hilar lymph nodes, new enlargement of the right hilar lymph node inferior to the right main bronchus measuring 1.2 x 1.8 cm (likely reactive per Dr. Cloretta) and distended esophagus with reflux or retained fluid nearly to the thoracic inlet with retained mucoid debris in the trachea.  Patient at high risk for aspiration secondary to esophageal fluid distention.  -NPO   History of a DVT s/p IVC filter, not on AC   Elevated LFTs, likely secondary to hepatic steatosis CTAP 12/28 with mild hepatic steatosis   Bayley CHRISTELLA Blower  12/17/2024, 10:14 AM   Attending physician's note   I have reviewed the chart and discussed his care on rounds. I performed a substantive portion of this encounter, including  complete performance of at least one of the key components, in conjunction with the APP. I agree with the APP's note, impression, and recommendations with my edits.  Inpatient GI service will sign off at this time.  Please do not hesitate to contact us  with additional questions or concerns.  [address] DO, FACG [phone] office           "

## 2024-12-17 NOTE — Progress Notes (Signed)
 Pt complaining of nausea.PRN phenergan  and compazine  given. At 05:30,pt vomited a large amount of dark,bloody emesis.NP notified.  IV site in left arm noted to be infiltrated with bruising;octreotide  and ampicillin  were infusing at the time.IV discontinued.pharmacy notified-no further intervention required. Left arm elevated;alternating cold and warm compresses applied intermittently.

## 2024-12-18 DIAGNOSIS — J690 Pneumonitis due to inhalation of food and vomit: Secondary | ICD-10-CM | POA: Diagnosis not present

## 2024-12-18 DIAGNOSIS — R112 Nausea with vomiting, unspecified: Secondary | ICD-10-CM | POA: Diagnosis not present

## 2024-12-18 DIAGNOSIS — R710 Precipitous drop in hematocrit: Secondary | ICD-10-CM

## 2024-12-18 DIAGNOSIS — K56609 Unspecified intestinal obstruction, unspecified as to partial versus complete obstruction: Secondary | ICD-10-CM | POA: Diagnosis not present

## 2024-12-18 DIAGNOSIS — Z515 Encounter for palliative care: Secondary | ICD-10-CM | POA: Diagnosis not present

## 2024-12-18 DIAGNOSIS — Z86718 Personal history of other venous thrombosis and embolism: Secondary | ICD-10-CM | POA: Diagnosis not present

## 2024-12-18 DIAGNOSIS — E43 Unspecified severe protein-calorie malnutrition: Secondary | ICD-10-CM | POA: Diagnosis not present

## 2024-12-18 DIAGNOSIS — K920 Hematemesis: Secondary | ICD-10-CM | POA: Diagnosis not present

## 2024-12-18 LAB — CBC
HCT: 21.9 % — ABNORMAL LOW (ref 39.0–52.0)
Hemoglobin: 7.1 g/dL — ABNORMAL LOW (ref 13.0–17.0)
MCH: 29.6 pg (ref 26.0–34.0)
MCHC: 32.4 g/dL (ref 30.0–36.0)
MCV: 91.3 fL (ref 80.0–100.0)
Platelets: 239 K/uL (ref 150–400)
RBC: 2.4 MIL/uL — ABNORMAL LOW (ref 4.22–5.81)
RDW: 14.8 % (ref 11.5–15.5)
WBC: 3.7 K/uL — ABNORMAL LOW (ref 4.0–10.5)
nRBC: 0.8 % — ABNORMAL HIGH (ref 0.0–0.2)

## 2024-12-18 LAB — BASIC METABOLIC PANEL WITH GFR
Anion gap: 10 (ref 5–15)
BUN: 29 mg/dL — ABNORMAL HIGH (ref 6–20)
CO2: 24 mmol/L (ref 22–32)
Calcium: 7.4 mg/dL — ABNORMAL LOW (ref 8.9–10.3)
Chloride: 101 mmol/L (ref 98–111)
Creatinine, Ser: 0.59 mg/dL — ABNORMAL LOW (ref 0.61–1.24)
GFR, Estimated: >60 mL/min
Glucose, Bld: 144 mg/dL — ABNORMAL HIGH (ref 70–99)
Potassium: 4.2 mmol/L (ref 3.5–5.1)
Sodium: 135 mmol/L (ref 135–145)

## 2024-12-18 LAB — GLUCOSE, CAPILLARY
Glucose-Capillary: 129 mg/dL — ABNORMAL HIGH (ref 70–99)
Glucose-Capillary: 135 mg/dL — ABNORMAL HIGH (ref 70–99)
Glucose-Capillary: 148 mg/dL — ABNORMAL HIGH (ref 70–99)
Glucose-Capillary: 152 mg/dL — ABNORMAL HIGH (ref 70–99)

## 2024-12-18 LAB — PREPARE RBC (CROSSMATCH)

## 2024-12-18 LAB — PHOSPHORUS: Phosphorus: 2.6 mg/dL (ref 2.5–4.6)

## 2024-12-18 LAB — MAGNESIUM: Magnesium: 1.9 mg/dL (ref 1.7–2.4)

## 2024-12-18 LAB — HEMOGLOBIN AND HEMATOCRIT, BLOOD
HCT: 26.8 % — ABNORMAL LOW (ref 39.0–52.0)
Hemoglobin: 9.2 g/dL — ABNORMAL LOW (ref 13.0–17.0)

## 2024-12-18 MED ORDER — TRAVASOL 10 % IV SOLN
INTRAVENOUS | Status: AC
  Filled 2024-12-18: qty 1188

## 2024-12-18 MED ORDER — SODIUM CHLORIDE 0.9% IV SOLUTION: Freq: Once | INTRAVENOUS | Status: AC

## 2024-12-18 MED ORDER — SODIUM CHLORIDE 0.9 % IV SOLN
25.0000 mg | INTRAVENOUS | Status: DC | PRN
  Administered 2024-12-18 – 2024-12-28 (×42): 25 mg via INTRAVENOUS
  Filled 2024-12-18 (×5): qty 25
  Filled 2024-12-18: qty 1
  Filled 2024-12-18 (×5): qty 25
  Filled 2024-12-18: qty 1
  Filled 2024-12-18 (×7): qty 25
  Filled 2024-12-18: qty 1
  Filled 2024-12-18: qty 25
  Filled 2024-12-18: qty 1
  Filled 2024-12-18 (×4): qty 25
  Filled 2024-12-18: qty 1
  Filled 2024-12-18 (×2): qty 25
  Filled 2024-12-18: qty 1
  Filled 2024-12-18 (×13): qty 25

## 2024-12-18 MED ORDER — FUROSEMIDE 10 MG/ML IJ SOLN
20.0000 mg | Freq: Once | INTRAMUSCULAR | Status: AC
  Administered 2024-12-18: 20 mg via INTRAVENOUS
  Filled 2024-12-18: qty 2

## 2024-12-18 NOTE — Progress Notes (Signed)
 PHARMACY - TOTAL PARENTERAL NUTRITION CONSULT NOTE   Indication: intolerance to enteral feeds  Patient Measurements: Height: 6' (182.9 cm) Weight: 78.4 kg (172 lb 13.5 oz) IBW/kg (Calculated) : 77.6 TPN AdjBW (KG): 70 Body mass index is 23.44 kg/m.  Assessment: 62 yoM admitted on 12/13 with intractable nausea and vomiting, dilated small bowel loops, and coffee-ground emesis. 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  He underwent EGD 12/16 showing esophagitis, gastric stensosis, stent placed, impaired peristalsis. Pharmacy was consulted to dose TPN on 12/20 for intolerance to enteral feeding.    Glucose / Insulin : No hx DM, no PTA meds. A1c 5.7% on 12/14 -BG goal <180. Range: 115-163 (7 units SSI/24 hrs) - Octreotide  infusion resumed 12/25 @ 50 mcg/hr (noted that Octreotide , especially at higher doses/frequencies, can cause glucose dysregulation) Electrolytes: All WNL, including CorrCa (8.8) Renal: SCr < 1, BUN 29 (elevated but stable) Hepatic: AST/ALT trending down 29/56; alk phos trending down 133 - Trig 164 (12/23), 132 (12/29) WNL - albumin  low at 2.3, T.bili WNL Intake / Output mIVF: none. Strict I/O not measured.  - Urine output x6, Emesis 200 mL + unmeasured - LBM: 12/31 GI Imaging: - 12/18 Abd xray: No bowel dilatation or evidence of obstruction. Retained gastric fluid. 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 Prosthesis placed. Duodenal dilation deformity  - 12/25 DG abd: nonobstructive bowel gas pattern - 12/27 DG abd: nonobstructive bowel gas pattern - 12/28 chest/abd CT: Esophageal distention. Nonspecific Enteritis. New mildly enlarged right hilar lymph node and a few slightly prominent bilateral hilar lymph nodes. New small volume of ascites  GI Surgeries / Procedures:  - 12/16 EGD: esophagitis  GI meds:   - Protonix  40mg  IV  q12h >> - Octreotide  infusion @50mcg /hr   Central access: Implanted Port (01/22/24) TPN start  date: 12/20   Nutritional Goals: Goal TPN rate is 90 mL/hr and provides 118 g of protein and 2298 kcals per day)  RD Assessment: Estimated Needs Total Energy Estimated Needs: 2100-2450 kcals Total Protein Estimated Needs: 105-120 grams Total Fluid Estimated Needs: >/= 2.1L  Current Nutrition:  NPO except for ice chips, sips with meds or for comfort TPN  TPN transitioned back to continuous infusion on 1/1. Once pt is otherwise medically stable and no longer requiring octreotide  infusion, can transition back to cyclic TPN. This can be done in the outpatient setting if necessary. MD aware.  Plan:   At 1800: Continue TPN continuous infusion at goal rate of 90 mL/hr Electrolytes in TPN: No changes Na 100 mEq/L K 50 mEq/L Ca 2 mEq/L Mg 5 mEq/L Phos 20 mmol/L Cl:Ac  to 1:1 Add standard MVI and trace elements to TPN Continue CBG to mSSI q6h Monitor TPN labs on Mon/Thurs.   Ronal CHRISTELLA Rav, PharmD 12/18/2024 8:57 AM

## 2024-12-18 NOTE — Progress Notes (Signed)
 OT Cancellation Note  Patient Details Name: Henry May MRN: [id number] DOB: [birth year]   Cancelled Treatment:    Reason Eval/Treat Not Completed: Medical issues which prohibited therapy. Planning on getting blood, threw up blood earlier and RN requests we hold evals.   Leita PARAS Lue Sykora 12/18/2024, 9:54 AM  Leita DEL OTR/L Acute Rehabilitation Services Office: [phone]

## 2024-12-18 NOTE — Progress Notes (Addendum)
 " PROGRESS NOTE    Henry May  FMW:[id number] DOB: 01/22/1966 DOA: 11/27/2024 PCP: [name], DO    No chief complaint on file.   Brief Narrative:  59 year old M with PMH of DVT with IVC filter, gastric cancer on chemo and esophagitis presented to [facility] due to nausea, vomiting and coffee-ground emesis 2 days after he started new chemotherapy, and admitted for intractable nausea and vomiting, dilated small bowel loops and coffee-ground emesis.  Recent EGD on 12/4 with LA grade D esophagitis without bleeding, and large infiltrative, sessile and ulcerated circumferential mass with contact of bleeding in gastric body and antrum with antral narrowing and luminal diameter of approximately 1.5 cm   In ED, stable vitals.  CBC and CMP without significant finding.  Lipase 171.  CT abdomen and pelvis showed dilated proximal small bowel suggesting SBO but without discrete transition point, thick walled gastric body/antrum without definite focal mass, mild nodularity along the pylorus, scarring with superimposed patchy peribronchovascular nodularity in the bilateral lower lobes right more than left suggesting mild aspiration or pneumonia.  Patient was started on antiemetics, IV fluid, PPI and admitted for further care.  Oncology, surgery, GI and palliative medicine consulted.   Patient underwent EGD and gastric stent placement on 12/16.  Was unable to tolerate diet.  Started on TPN.  Plan was for discharge with TPN once logistic in place but nausea and vomiting gotten worse after stopping octreotide  and Decadron .  Also having hemoptysis with drop in hemoglobin.  Restarted on octreotide  infusion.     GI reengaged and order CT chest, abdomen and pelvis that showed new patchy substernal pneumomediastinum of indeterminate source, patchy consolidation in lower lobes and posterior LUL, esophageal distention with retained/reflux fluid in thoracic inlet, emphysematous cystitis and circumferential  gastric thickening with adjacent stranding change, antropyloric stenting and continued duodenal dilation, thickening jejunal folds.    Assessment & Plan:   Principal Problem:   Hematemesis with nausea Active Problems:   Intractable nausea and vomiting   SBO (small bowel obstruction) (HCC)   Diabetes mellitus without complication (HCC)   Gastric cancer (HCC)   History of DVT (deep vein thrombosis)   Protein-calorie malnutrition, severe   Gastric outlet obstruction   Decreased hemoglobin   Subcutaneous emphysema   Fever, unspecified   Aspiration pneumonia due to gastric secretions (HCC)   Coffee ground emesis  #1 gastric cancer, gastric luminal narrowing and esophagitis/intractable nausea and vomiting/acute blood loss anemia due to hematemesis likely secondary to bleeding from gastric cancer - CT done initially with dilated small bowel without transition point. - Patient seen by GI underwent EGD and gastric stenting 11/30/2024. - Repeat CT angiogram chest abdomen and pelvis as noted above. - Status post transfusion total of 2 units PRBCs on 12/12/2024 with hemoglobin noted at 6.3 on 12/16/2024, patient status posttransfusion 2 units PRBCs (12/16/2024) with hemoglobin currently at 7.1.   - Patient with ongoing emesis with blood in it over the past 1 to 2 days.  - Patient noted to have had 2 bowel movements the morning of 12/15/2024.  - IV Unasyn  resumed on 12/11/2024-12/17/2024. - Patient currently n.p.o. except ice chips and sips with meds. - Continue TPN per pharmacy. - Continue IV antiemetics of Phenergan , Zofran  and Ativan . - Octreotide  resumed 12/09/2024. - Continue IV PPI twice daily - Aspiration precautions. -Hemoglobin at 7.1 this morning, will transfuse 2 units PRBCs as patient with ongoing emesis with blood in it. -Oncology recommending radiation oncology evaluation for palliative radiation to gastric mass. -  GI, oncology, palliative care following.  2.  Gastric cancer  -  Patient noted to have recently restarted on chemotherapy and being followed by Dr. Cloretta. - Status post EGD with gastric stenting 11/30/2024. - Patient noted to have received cisplatin  chemo on 12/07/2024. - Repeat CT chest abdomen and pelvis was done as noted above. - Oncology/status post #3 cisplatin . - Oncology, palliative care following. - Palliative care met with family and underwent family meeting on 12/15/2024 and will meet again today, 12/18/2023.  - Patient noted with bout of emesis with blood in it early over the past 2 days.  - Hemoglobin currently at 7.1.   - Transfused 2 units PRBCs.   -Oncology consulted with radiation oncology for palliative radiation and underwent first radiation treatment 12/17/2024 and next treatment will be on Monday, 12/20/2024.. -  3.  Aspiration pneumonitis/pneumonia -Initial chest x-ray done showed patchy airspace opacity in the right middle lobe. - Initial CT abdomen and pelvis with bibasilar infiltrate. - Patient noted to have completed 7 days of appropriate antibiotics. - Patient noted to have spiked a fever on 12/11/2024 with repeat chest x-ray showing stable right lung opacity/infiltrate. - Repeat CT chest abdomen and pelvis was done as noted above. - IV Unasyn  resumed on 12/11/2024 and discontinued on 12/17/2024.. - Aspiration precautions - Wean oxygen  as able to.  4.  Pneumomediastinum -Noted on CT chest. - Felt could likely be secondary to retching and vomiting. - Follow-up.  5.  Diet-controlled diabetes with hyperglycemia -Hemoglobin A1c 5.7% (11/28/2024) - CBG noted at 140 this morning. - Continue TPN per pharmacy. - SSI.  6.  History of DVT - Status post IVC filter.    7.  Elevated lipase -Improved.  8.  Insomnia mainly due to intractable nausea and vomiting -IV Ativan  and Phenergan  as above. - Patient also on octreotide  per oncology.  9.  Lightheadedness/presyncope -Likely secondary to intractable nausea vomiting hematemesis  and volume depletion. -Improved. - On TPN and IV fluids.  10.  Severe malnutrition - BMI of 22.1 kg/m. - Likely secondary to chronic illness of gastric cancer. - Patient with severe fat depletion, severe muscle depletion. - Patient on TPN. - RD following.   DVT prophylaxis: SCDs Code Status: DNR Family Communication: Updated patient, wife at bedside. Disposition: TBD  Status is: Inpatient Remains inpatient appropriate because: Severity of illness   Consultants:  Gastroenterology: Dr. Charlanne 11/28/2024 Palliative care: Dr. Antonette 11/28/2024 General Surgery: Dr. Curvin III 11/30/2024 Oncology: Dr. Cloretta Radiation oncology  Procedures:  CT abdomen and pelvis 11/27/2024 CT chest abdomen and pelvis 12/12/2024 Upper endoscopy per GI: Dr. Wilhelmenia 11/30/2024 Transfuse 2 units PRBCs 12/18/2024. Status post 4 units PRBCs  Antimicrobials:  Anti-infectives (From admission, onward)    Start     Dose/Rate Route Frequency Ordered Stop   12/11/24 0900  Ampicillin -Sulbactam (UNASYN ) 3 g in sodium chloride  0.9 % 100 mL IVPB        3 g 200 mL/hr over 30 Minutes Intravenous Every 6 hours 12/11/24 0817 12/18/24 0950   11/28/24 1200  Ampicillin -Sulbactam (UNASYN ) 3 g in sodium chloride  0.9 % 100 mL IVPB  Status:  Discontinued        3 g 200 mL/hr over 30 Minutes Intravenous Every 6 hours 11/28/24 1048 12/05/24 0909   11/28/24 0045  Ampicillin -Sulbactam (UNASYN ) 3 g in sodium chloride  0.9 % 100 mL IVPB        3 g 200 mL/hr over 30 Minutes Intravenous  Once 11/28/24 0042 11/28/24 0134  Subjective: Patient lying in bed.  Noted to have a bout of emesis with blood in it this morning per wife about 500 cc.  Patient states Phenergan  helped his nausea.  Feeling a little bit better.  Denies any shortness of breath.  No chest pain.  No abdominal pain.    Objective: Vitals:   12/18/24 0839 12/18/24 0910 12/18/24 0927 12/18/24 1000  BP: (!) 98/57 (!) 97/56 (!) 101/58 (!) 100/58   Pulse: 77 70 67   Resp: 18 18 18    Temp: 98.1 F (36.7 C) 98.1 F (36.7 C) 97.8 F (36.6 C)   TempSrc: Oral Oral Oral   SpO2: 97% 96% 97%   Weight:      Height:        Intake/Output Summary (Last 24 hours) at 12/18/2024 1231 Last data filed at 12/18/2024 9390 Gross per 24 hour  Intake 4100.54 ml  Output 200 ml  Net 3900.54 ml   Filed Weights   12/16/24 0500 12/17/24 0500 12/18/24 0500  Weight: 81.4 kg 79.3 kg 78.4 kg    Examination:  General exam: NAD. Respiratory system: Bibasilar crackles.  No wheezes, no crackles, no rhonchi.  Fair air movement.  Speaking in full sentences.  No use of accessory muscles of respiration.   Cardiovascular system: RRR no murmurs rubs or gallops.  No JVD.  No lower extremity edema. Gastrointestinal system: Abdomen is soft, nontender, nondistended, positive bowel sounds.  No rebound.  No guarding.  Central nervous system: Alert and oriented. No focal neurological deficits. Extremities: Symmetric 5 x 5 power. Skin: No rashes, lesions or ulcers Psychiatry: Judgement and insight appear normal. Mood & affect appropriate.     Data Reviewed: I have personally reviewed following labs and imaging studies  CBC: Recent Labs  Lab 12/13/24 0819 12/14/24 0408 12/15/24 0420 12/16/24 0423 12/16/24 1750 12/17/24 0341 12/18/24 0513  WBC  --  5.1 4.5 3.7*  --  4.5 3.7*  NEUTROABS 3.5 3.9 3.3 3.0  --   --   --   HGB  --  8.8* 7.5* 6.3* 9.2* 9.2* 7.1*  HCT  --  26.9* 23.2* 19.1* 27.9* 27.8* 21.9*  MCV  --  93.7 94.3 93.2  --  90.6 91.3  PLT  --  210 209 210  --  272 239    Basic Metabolic Panel: Recent Labs  Lab 12/13/24 0304 12/14/24 0408 12/15/24 0420 12/16/24 0423 12/17/24 0341 12/18/24 0513  NA 144 138 137 135 137 135  K 4.0 4.3 4.3 4.8 4.0 4.2  CL 109 103 102 104 103 101  CO2 29 24 26 23 25 24   GLUCOSE 154* 155* 161* 223* 131* 144*  BUN 29* 26* 28* 28* 31* 29*  CREATININE 0.60* 0.70 0.66 0.57* 0.66 0.59*  CALCIUM  8.0* 8.1* 8.0*  7.8* 7.9* 7.4*  MG 2.0 2.1  --  2.4 2.1 1.9  PHOS 3.7 3.1 3.0 2.6 2.9 2.6    GFR: Estimated Creatinine Clearance: 107.8 mL/min (A) (by C-G formula based on SCr of 0.59 mg/dL (L)).  Liver Function Tests: Recent Labs  Lab 12/12/24 0446 12/13/24 0304 12/14/24 0408 12/15/24 0420 12/16/24 0423  AST 14* 20 62* 34 29  ALT 32 51* 81* 65* 56*  ALKPHOS 94 103 161* 147* 133*  BILITOT 0.3 0.3 0.4 0.6 0.4  PROT 5.0* 5.1* 5.3* 5.4* 5.1*  ALBUMIN  2.3* 2.4* 2.5* 2.4* 2.3*    CBG: Recent Labs  Lab 12/17/24 1156 12/17/24 1755 12/17/24 2356 12/18/24 0559 12/18/24 1151  GLUCAP  135* 115* 163* 148* 135*     No results found for this or any previous visit (from the past 240 hours).       Radiology Studies: No results found.      Scheduled Meds:  Chlorhexidine  Gluconate Cloth  6 each Topical Daily   insulin  aspart  0-15 Units Subcutaneous Q6H   mouth rinse  15 mL Mouth Rinse 4 times per day   pantoprazole  (PROTONIX ) IV  40 mg Intravenous Q12H   sucralfate   1 g Oral TID   Continuous Infusions:  octreotide  (SANDOSTATIN ) 500 mcg in sodium chloride  0.9 % 250 mL (2 mcg/mL) infusion Stopped (12/18/24 0951)   promethazine  (PHENERGAN ) injection (IM or IVPB) 25 mg (12/18/24 1048)   TPN ADULT (ION) 90 mL/hr at 12/17/24 1740   TPN ADULT (ION)       LOS: 20 days    Time spent: 40 minutes    Toribio Hummer, MD [facility]   To contact the attending provider between 7A-7P or the covering provider during after hours 7P-7A, please log into the web site [email or web address] and access using universal  password for that web site. If you do not have the password, please call the hospital operator.  12/18/2024, 12:31 PM    "

## 2024-12-18 NOTE — Progress Notes (Signed)
 PT Cancellation Note  Patient Details Name: Henry May MRN: [id number] DOB: 14-Apr-1966   Cancelled Treatment:    Reason Eval/Treat Not Completed: Other (comment);Medical issues which prohibited therapy. Per RN, pt with emesis with blood in it this morning and 2 units PRBC ordered, requesting we hold evals until blood transfusion complete. Will check back for eval as schedule permits.   Metta Ave PT, DPT 12/18/2024, 9:51 AM

## 2024-12-18 NOTE — Progress Notes (Signed)
 "                                                                                                                                                                                                          Daily Progress Note   Patient Name: Henry May       Date: 12/18/2024 DOB: 1966-07-01  Age: 59 y.o. MRN#: [id number] Attending Physician: Sebastian Toribio GAILS, MD Primary Care Physician: [name], DO Admit Date: 11/27/2024  Reason for Consultation/Follow-up: Establishing goals of care  Patient Profile/HPI: 59 yo M with PMH of  DVT with IVC, gastric cancer with mets to liver, peritoneum, omentum- currently on chemotherapy admitted with nausea, vomiting, hematemesis. Workup revealed likely SBO/illeus, possible aspiration pneumonia. Underwent gastric stent placement on 12/16 and was started on TPN.  N/V worsened during hospitalization, but has improved with octreotide .  Oncology consulted and he received chemotherapy while inpatient yesterday.  Admission has been complicated by ongoing hematemesis likely related to tumor, requiring blood transfusion. Oncology recommended considering radiation therapy.  Palliative consulted for goals of care.    Subjective: Chart reviewed including labs, progress notes, imaging from this and previous encounters.  Hgb this morning was 7.1. He received 2 units transfusion.  RN reports he completed 2 laps of ambulation around the unit today.  His nausea is being controlled with phenergan  and lorazepam .  He is awake, alert. Many family members at bedside. Further radiation is scheduled for Monday.  No questions or needs today.    Review of Systems  Constitutional:  Positive for malaise/fatigue.  Gastrointestinal:  Positive for nausea.     Physical Exam Vitals and nursing note reviewed.  Constitutional:      General: He is not in acute distress.    Appearance: He is ill-appearing.  Cardiovascular:     Rate and Rhythm: Normal rate.  Pulmonary:      Effort: Pulmonary effort is normal.  Neurological:     Mental Status: He is alert.             Vital Signs: BP 108/69 (BP Location: Right Arm)   Pulse 85   Temp 97.9 F (36.6 C) (Oral)   Resp 20   Ht 6' (1.829 m)   Wt 78.4 kg   SpO2 95%   BMI 23.44 kg/m  SpO2: SpO2: 95 % O2 Device: O2 Device: Nasal Cannula O2 Flow Rate: O2 Flow Rate (L/min): 4 L/min  Intake/output summary:  Intake/Output Summary (Last 24 hours) at 12/18/2024 1553 Last data filed at 12/18/2024 1400 Gross per 24 hour  Intake  4018.19 ml  Output 200 ml  Net 3818.19 ml   LBM: Last BM Date : 12/15/24 Baseline Weight: Weight: 70 kg Most recent weight: Weight: 78.4 kg   Patient Active Problem List   Diagnosis Date Noted   Aspiration pneumonia due to gastric secretions (HCC) 12/13/2024   Coffee ground emesis 12/13/2024   Subcutaneous emphysema 12/11/2024   Fever, unspecified 12/11/2024   Decreased hemoglobin 12/10/2024   Protein-calorie malnutrition, severe 11/30/2024   Gastric outlet obstruction 11/30/2024   Hematemesis with nausea 11/28/2024   Intractable nausea and vomiting 11/28/2024   SBO (small bowel obstruction) (HCC) 11/28/2024   Acute kidney injury (AKI) with acute tubular necrosis (ATN) 02/05/2024   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   AKI (acute kidney injury) 02/01/2024   Gastric cancer (HCC) 01/26/2024   Knee pain 10/14/2022   Healthcare maintenance 10/14/2022   Diabetes mellitus without complication (HCC) 10/31/2020   Cardiac arrest (HCC) 10/20/2020   History of DVT (deep vein thrombosis) 08/19/2020   Chest pain 08/19/2020   Acute hypoxemic respiratory failure due to COVID-19 [facility]) 08/08/2020   ARF (acute renal failure) 08/07/2020   Elevated LFTs 08/07/2020   Acute respiratory disease due to COVID-19 virus 08/07/2020   Pneumonia due to COVID-19 virus 08/07/2020   Acute respiratory failure due to COVID-19 [facility]) 08/06/2020    Palliative Care Assessment & Plan     Assessment/Recommendations/Plan  Stage IV gastric cancer- admitted with obstruction/illeus - continue current interventions- plan to continue radiation and chemotherapy for bleeding tumor Code status changed to DNR Palliative team will follow intermittently- please call if acute needs arise   Code Status:   Code Status: Limited: Do not attempt resuscitation (DNR) -DNR-LIMITED -Do Not Intubate/DNI    Prognosis:  Unable to determine  Discharge Planning: To Be Determined  Care plan was discussed with patient and family  Thank you for allowing the Palliative Medicine Team to assist in the care of this patient.   Prolonged billing:  Time includes:   Preparing to see the patient (e.g., review of tests) Obtaining and/or reviewing separately obtained history Performing a medically necessary appropriate examination and/or evaluation Counseling and educating the patient/family/caregiver Ordering medications, tests, or procedures Referring and communicating with other health care professionals (when not reported separately) Documenting clinical information in the electronic or other health record Independently interpreting results (not reported separately) and communicating results to the patient/family/caregiver Care coordination (not reported separately) Clinical documentation  Cassondra Stain, AGNP-C Palliative Medicine   Please contact Palliative Medicine Team phone at [phone] for questions and concerns.        "

## 2024-12-19 ENCOUNTER — Other Ambulatory Visit: Payer: Self-pay

## 2024-12-19 DIAGNOSIS — R112 Nausea with vomiting, unspecified: Secondary | ICD-10-CM | POA: Diagnosis not present

## 2024-12-19 DIAGNOSIS — E43 Unspecified severe protein-calorie malnutrition: Secondary | ICD-10-CM | POA: Diagnosis not present

## 2024-12-19 DIAGNOSIS — J690 Pneumonitis due to inhalation of food and vomit: Secondary | ICD-10-CM | POA: Diagnosis not present

## 2024-12-19 DIAGNOSIS — Z86718 Personal history of other venous thrombosis and embolism: Secondary | ICD-10-CM | POA: Diagnosis not present

## 2024-12-19 LAB — BASIC METABOLIC PANEL WITH GFR
Anion gap: 9 (ref 5–15)
BUN: 29 mg/dL — ABNORMAL HIGH (ref 6–20)
CO2: 25 mmol/L (ref 22–32)
Calcium: 7.8 mg/dL — ABNORMAL LOW (ref 8.9–10.3)
Chloride: 101 mmol/L (ref 98–111)
Creatinine, Ser: 0.64 mg/dL (ref 0.61–1.24)
GFR, Estimated: >60 mL/min
Glucose, Bld: 122 mg/dL — ABNORMAL HIGH (ref 70–99)
Potassium: 3.8 mmol/L (ref 3.5–5.1)
Sodium: 135 mmol/L (ref 135–145)

## 2024-12-19 LAB — GLUCOSE, CAPILLARY
Glucose-Capillary: 121 mg/dL — ABNORMAL HIGH (ref 70–99)
Glucose-Capillary: 138 mg/dL — ABNORMAL HIGH (ref 70–99)
Glucose-Capillary: 139 mg/dL — ABNORMAL HIGH (ref 70–99)
Glucose-Capillary: 142 mg/dL — ABNORMAL HIGH (ref 70–99)

## 2024-12-19 LAB — CBC
HCT: 28.8 % — ABNORMAL LOW (ref 39.0–52.0)
Hemoglobin: 9.7 g/dL — ABNORMAL LOW (ref 13.0–17.0)
MCH: 29.4 pg (ref 26.0–34.0)
MCHC: 33.7 g/dL (ref 30.0–36.0)
MCV: 87.3 fL (ref 80.0–100.0)
Platelets: 256 K/uL (ref 150–400)
RBC: 3.3 MIL/uL — ABNORMAL LOW (ref 4.22–5.81)
RDW: 16.1 % — ABNORMAL HIGH (ref 11.5–15.5)
WBC: 2.5 K/uL — ABNORMAL LOW (ref 4.0–10.5)
nRBC: 1.6 % — ABNORMAL HIGH (ref 0.0–0.2)

## 2024-12-19 LAB — HEMOGLOBIN AND HEMATOCRIT, BLOOD
HCT: 25.6 % — ABNORMAL LOW (ref 39.0–52.0)
Hemoglobin: 8.7 g/dL — ABNORMAL LOW (ref 13.0–17.0)

## 2024-12-19 MED ORDER — TRAVASOL 10 % IV SOLN
INTRAVENOUS | Status: AC
  Filled 2024-12-19: qty 1188

## 2024-12-19 NOTE — Progress Notes (Signed)
 PHARMACY - TOTAL PARENTERAL NUTRITION CONSULT NOTE   Indication: intolerance to enteral feeds  Patient Measurements: Height: 6' (182.9 cm) Weight: 74 kg (163 lb 2.3 oz) IBW/kg (Calculated) : 77.6 TPN AdjBW (KG): 70 Body mass index is 22.13 kg/m.  Assessment: 78 yoM admitted on 12/13 with intractable nausea and vomiting, dilated small bowel loops, and coffee-ground emesis. 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  He underwent EGD 12/16 showing esophagitis, gastric stensosis, stent placed, impaired peristalsis. Pharmacy was consulted to dose TPN on 12/20 for intolerance to enteral feeding.    Glucose / Insulin : No hx DM, no PTA meds. A1c 5.7% on 12/14 -BG goal <180. Range: 122-152 (7 units SSI/24 hrs) - Octreotide  infusion resumed 12/25 @ 50 mcg/hr (noted that Octreotide , especially at higher doses/frequencies, can cause glucose dysregulation) Electrolytes: All WNL, including CorrCa (9.2) Renal: SCr < 1, BUN 29 (elevated but stable) Hepatic: AST/ALT trending down 29/56; alk phos trending down 133 - Trig 164 (12/23), 132 (12/29) WNL - albumin  low at 2.3, T.bili WNL Intake / Output mIVF: none. Strict I/O not measured.  - Urine output x7, Emesis 700 mL + unmeasured - LBM: 12/31 GI Imaging: - 12/18 Abd xray: No bowel dilatation or evidence of obstruction. Retained gastric fluid. 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 Prosthesis placed. Duodenal dilation deformity  - 12/25 DG abd: nonobstructive bowel gas pattern - 12/27 DG abd: nonobstructive bowel gas pattern - 12/28 chest/abd CT: Esophageal distention. Nonspecific Enteritis. New mildly enlarged right hilar lymph node and a few slightly prominent bilateral hilar lymph nodes. New small volume of ascites  GI Surgeries / Procedures:  - 12/16 EGD: esophagitis  GI meds:   - Protonix  40mg  IV  q12h >> - Octreotide  infusion @50mcg /hr   Central access: Implanted Port (01/22/24) TPN start  date: 12/20   Nutritional Goals: Goal TPN rate is 90 mL/hr and provides 118 g of protein and 2298 kcals per day)  RD Assessment: Estimated Needs Total Energy Estimated Needs: 2100-2450 kcals Total Protein Estimated Needs: 105-120 grams Total Fluid Estimated Needs: >/= 2.1L  Current Nutrition:  NPO except for ice chips, sips with meds or for comfort TPN  TPN transitioned back to continuous infusion on 1/1. Once pt is otherwise medically stable and no longer requiring octreotide  infusion, can transition back to cyclic TPN. This can be done in the outpatient setting if necessary. MD aware.  Plan:   At 1800: Continue TPN continuous infusion at goal rate of 90 mL/hr Electrolytes in TPN: Increase K Na 100 mEq/L K 60 mEq/L Ca 2 mEq/L Mg 5 mEq/L Phos 20 mmol/L Cl:Ac  to 1:1 Add standard MVI and trace elements to TPN Continue CBG to mSSI q6h Monitor TPN labs on Mon/Thurs.   Ronal CHRISTELLA Rav, PharmD 12/19/2024 10:14 AM

## 2024-12-19 NOTE — Progress Notes (Signed)
 " PROGRESS NOTE    Henry May  FMW:[id number] DOB: Oct 30, 1966 DOA: 11/27/2024 PCP: [name], DO    No chief complaint on file.   Brief Narrative:  59 year old M with PMH of DVT with IVC filter, gastric cancer on chemo and esophagitis presented to [facility] due to nausea, vomiting and coffee-ground emesis 2 days after he started new chemotherapy, and admitted for intractable nausea and vomiting, dilated small bowel loops and coffee-ground emesis.  Recent EGD on 12/4 with LA grade D esophagitis without bleeding, and large infiltrative, sessile and ulcerated circumferential mass with contact of bleeding in gastric body and antrum with antral narrowing and luminal diameter of approximately 1.5 cm   In ED, stable vitals.  CBC and CMP without significant finding.  Lipase 171.  CT abdomen and pelvis showed dilated proximal small bowel suggesting SBO but without discrete transition point, thick walled gastric body/antrum without definite focal mass, mild nodularity along the pylorus, scarring with superimposed patchy peribronchovascular nodularity in the bilateral lower lobes right more than left suggesting mild aspiration or pneumonia.  Patient was started on antiemetics, IV fluid, PPI and admitted for further care.  Oncology, surgery, GI and palliative medicine consulted.   Patient underwent EGD and gastric stent placement on 12/16.  Was unable to tolerate diet.  Started on TPN.  Plan was for discharge with TPN once logistic in place but nausea and vomiting gotten worse after stopping octreotide  and Decadron .  Also having hemoptysis with drop in hemoglobin.  Restarted on octreotide  infusion.     GI reengaged and order CT chest, abdomen and pelvis that showed new patchy substernal pneumomediastinum of indeterminate source, patchy consolidation in lower lobes and posterior LUL, esophageal distention with retained/reflux fluid in thoracic inlet, emphysematous cystitis and circumferential  gastric thickening with adjacent stranding change, antropyloric stenting and continued duodenal dilation, thickening jejunal folds.    Assessment & Plan:   Principal Problem:   Hematemesis with nausea Active Problems:   Intractable nausea and vomiting   SBO (small bowel obstruction) (HCC)   Diabetes mellitus without complication (HCC)   Gastric cancer (HCC)   History of DVT (deep vein thrombosis)   Protein-calorie malnutrition, severe   Gastric outlet obstruction   Decreased hemoglobin   Subcutaneous emphysema   Fever, unspecified   Aspiration pneumonia due to gastric secretions (HCC)   Coffee ground emesis  #1 gastric cancer, gastric luminal narrowing and esophagitis/intractable nausea and vomiting/acute blood loss anemia due to hematemesis likely secondary to bleeding from gastric cancer - CT done initially with dilated small bowel without transition point. - Patient seen by GI underwent EGD and gastric stenting 11/30/2024. - Repeat CT angiogram chest abdomen and pelvis as noted above. - Status post transfusion total of 2 units PRBCs on 12/12/2024 with hemoglobin noted at 6.3 on 12/16/2024, patient status posttransfusion 2 units PRBCs (12/16/2024) with hemoglobin noted at 7.1 (12/18/2024 ) status post transfusion 2 units PRBCs with hemoglobin currently at 9.7.   - Patient with ongoing emesis with blood in it over the past 2-3 days.  - Patient noted to have had 2 bowel movements the morning of 12/15/2024 and also noted to have a bowel movement today per wife..  - IV Unasyn  resumed on 12/11/2024-12/17/2024. - Patient currently n.p.o. except ice chips and sips with meds. - Continue TPN per pharmacy. - Continue IV antiemetics of Phenergan , Zofran  and Ativan . - Octreotide  resumed 12/09/2024. - Continue IV PPI twice daily - Aspiration precautions. -Hemoglobin at 9.7 today.   - Patient  status posttransfusion 6 times PRBCs during the hospitalization.   - Patient with emesis with blood in it  this morning/will repeat H&H this afternoon.  -Oncology recommended radiation oncology evaluation for palliative radiation to gastric mass. -Patient underwent first palliative radiation on 12/17/2024 with next treatment planned for 12/20/2024.  -GI, oncology, palliative care following.  2.  Gastric cancer  - Patient noted to have recently restarted on chemotherapy and being followed by Dr. Cloretta. - Status post EGD with gastric stenting 11/30/2024. -Patient still with ongoing emesis with blood in it. - Patient noted to have received cisplatin  chemo on 12/07/2024. - Repeat CT chest abdomen and pelvis was done as noted above. - Oncology/status post #3 cisplatin . - Oncology, palliative care following. - Palliative care met with family and underwent family meeting on 12/15/2024 and will meet again today, 12/18/2023.  - Patient noted with bout of emesis with blood in it over the past 3 days.  -Status post transfusion 6 units PRBCs during this hospitalization. - Hemoglobin currently at 9.7. -Repeat H&H this afternoon. -Oncology consulted with radiation oncology for palliative radiation and underwent first radiation treatment 12/17/2024 and next treatment will be on Monday, 12/20/2024.. -  3.  Aspiration pneumonitis/pneumonia -Initial chest x-ray done showed patchy airspace opacity in the right middle lobe. - Initial CT abdomen and pelvis with bibasilar infiltrate. - Patient noted to have completed 7 days of appropriate antibiotics. - Patient noted to have spiked a fever on 12/11/2024 with repeat chest x-ray showing stable right lung opacity/infiltrate. - Repeat CT chest abdomen and pelvis was done as noted above. - IV Unasyn  resumed on 12/11/2024 and discontinued on 12/17/2024.. - Aspiration precautions - Wean oxygen  as able to.  4.  Pneumomediastinum -Noted on CT chest. - Felt could likely be secondary to retching and vomiting. - Follow-up.  5.  Diet-controlled diabetes with  hyperglycemia -Hemoglobin A1c 5.7% (11/28/2024) - CBG noted at 142 this morning. - Continue TPN per pharmacy. - SSI.  6.  History of DVT - Status post IVC filter.    7.  Elevated lipase -Improved.  8.  Insomnia mainly due to intractable nausea and vomiting -IV Ativan  and Phenergan  as above. - Patient also on octreotide  per oncology.  9.  Lightheadedness/presyncope -Likely secondary to intractable nausea vomiting hematemesis and volume depletion. -Improved. - On TPN and IV fluids.  10.  Severe malnutrition - BMI of 22.1 kg/m. - Likely secondary to chronic illness of gastric cancer. - Patient with severe fat depletion, severe muscle depletion. - Patient on TPN. - RD following.   DVT prophylaxis: SCDs Code Status: DNR Family Communication: Updated patient, wife at bedside. Disposition: TBD  Status is: Inpatient Remains inpatient appropriate because: Severity of illness   Consultants:  Gastroenterology: Dr. Charlanne 11/28/2024 Palliative care: Dr. Antonette 11/28/2024 General Surgery: Dr. Curvin III 11/30/2024 Oncology: Dr. Cloretta Radiation oncology  Procedures:  CT abdomen and pelvis 11/27/2024 CT chest abdomen and pelvis 12/12/2024 Upper endoscopy per GI: Dr. Wilhelmenia 11/30/2024 Transfuse 2 units PRBCs 12/18/2024. Status post 4 units PRBCs  Antimicrobials:  Anti-infectives (From admission, onward)    Start     Dose/Rate Route Frequency Ordered Stop   12/11/24 0900  Ampicillin -Sulbactam (UNASYN ) 3 g in sodium chloride  0.9 % 100 mL IVPB        3 g 200 mL/hr over 30 Minutes Intravenous Every 6 hours 12/11/24 0817 12/18/24 0950   11/28/24 1200  Ampicillin -Sulbactam (UNASYN ) 3 g in sodium chloride  0.9 % 100 mL IVPB  Status:  Discontinued  3 g 200 mL/hr over 30 Minutes Intravenous Every 6 hours 11/28/24 1048 12/05/24 0909   11/28/24 0045  Ampicillin -Sulbactam (UNASYN ) 3 g in sodium chloride  0.9 % 100 mL IVPB        3 g 200 mL/hr over 30 Minutes Intravenous   Once 11/28/24 0042 11/28/24 0134         Subjective: Patient lying in bed.  Wife at bedside.  Wife states patient had emesis with blood in it about 350 cc at 10:50 AM.  Patient also noted to have a brown bowel movement at 11:40 AM per wife.  Patient denies any chest pain or significant shortness of breath.  Per wife patient ambulated twice in the hallway yesterday.   Objective: Vitals:   12/18/24 1623 12/18/24 1845 12/18/24 2004 12/19/24 0623  BP: 114/69 111/68 114/68 (!) 106/59  Pulse: 79 74 78 93  Resp: 18 18 20 20   Temp: 98.4 F (36.9 C) 99.4 F (37.4 C) 97.9 F (36.6 C) 98.2 F (36.8 C)  TempSrc:  Oral Oral Oral  SpO2: 97% 97% 98% 98%  Weight:    74 kg  Height:        Intake/Output Summary (Last 24 hours) at 12/19/2024 1244 Last data filed at 12/19/2024 1238 Gross per 24 hour  Intake 3814.14 ml  Output 825 ml  Net 2989.14 ml   Filed Weights   12/17/24 0500 12/18/24 0500 12/19/24 0623  Weight: 79.3 kg 78.4 kg 74 kg    Examination:  General exam: NAD. Respiratory system: CTAB.  No wheezes, no crackles, no rhonchi.  Movement.  Speaking full sentences.  No use of accessory muscles respiration.   Cardiovascular system: Regular rate rhythm no murmurs rubs or gallops.  No JVD.  No lower extremity edema.  Gastrointestinal system: Abdomen is soft, nontender, nondistended, positive bowel sounds.  No rebound.  No guarding.  Central nervous system: Alert and oriented. No focal neurological deficits. Extremities: Symmetric 5 x 5 power. Skin: No rashes, lesions or ulcers Psychiatry: Judgement and insight appear normal. Mood & affect appropriate.     Data Reviewed: I have personally reviewed following labs and imaging studies  CBC: Recent Labs  Lab 12/13/24 0819 12/14/24 0408 12/15/24 0420 12/16/24 0423 12/16/24 1750 12/17/24 0341 12/18/24 0513 12/18/24 2034 12/19/24 0446  WBC  --  5.1 4.5 3.7*  --  4.5 3.7*  --  2.5*  NEUTROABS 3.5 3.9 3.3 3.0  --   --   --   --    --   HGB  --  8.8* 7.5* 6.3* 9.2* 9.2* 7.1* 9.2* 9.7*  HCT  --  26.9* 23.2* 19.1* 27.9* 27.8* 21.9* 26.8* 28.8*  MCV  --  93.7 94.3 93.2  --  90.6 91.3  --  87.3  PLT  --  210 209 210  --  272 239  --  256    Basic Metabolic Panel: Recent Labs  Lab 12/13/24 0304 12/14/24 0408 12/15/24 0420 12/16/24 0423 12/17/24 0341 12/18/24 0513 12/19/24 0446  NA 144 138 137 135 137 135 135  K 4.0 4.3 4.3 4.8 4.0 4.2 3.8  CL 109 103 102 104 103 101 101  CO2 29 24 26 23 25 24 25   GLUCOSE 154* 155* 161* 223* 131* 144* 122*  BUN 29* 26* 28* 28* 31* 29* 29*  CREATININE 0.60* 0.70 0.66 0.57* 0.66 0.59* 0.64  CALCIUM  8.0* 8.1* 8.0* 7.8* 7.9* 7.4* 7.8*  MG 2.0 2.1  --  2.4 2.1 1.9  --  PHOS 3.7 3.1 3.0 2.6 2.9 2.6  --     GFR: Estimated Creatinine Clearance: 102.8 mL/min (by C-G formula based on SCr of 0.64 mg/dL).  Liver Function Tests: Recent Labs  Lab 12/13/24 0304 12/14/24 0408 12/15/24 0420 12/16/24 0423  AST 20 62* 34 29  ALT 51* 81* 65* 56*  ALKPHOS 103 161* 147* 133*  BILITOT 0.3 0.4 0.6 0.4  PROT 5.1* 5.3* 5.4* 5.1*  ALBUMIN  2.4* 2.5* 2.4* 2.3*    CBG: Recent Labs  Lab 12/18/24 1151 12/18/24 1739 12/18/24 2357 12/19/24 0623 12/19/24 1143  GLUCAP 135* 129* 152* 142* 138*     No results found for this or any previous visit (from the past 240 hours).       Radiology Studies: No results found.      Scheduled Meds:  Chlorhexidine  Gluconate Cloth  6 each Topical Daily   insulin  aspart  0-15 Units Subcutaneous Q6H   mouth rinse  15 mL Mouth Rinse 4 times per day   pantoprazole  (PROTONIX ) IV  40 mg Intravenous Q12H   sucralfate   1 g Oral TID   Continuous Infusions:  octreotide  (SANDOSTATIN ) 500 mcg in sodium chloride  0.9 % 250 mL (2 mcg/mL) infusion 50 mcg/hr (12/19/24 0406)   promethazine  (PHENERGAN ) injection (IM or IVPB) 25 mg (12/19/24 1125)   TPN ADULT (ION) 90 mL/hr at 12/18/24 1832   TPN ADULT (ION)       LOS: 21 days    Time spent: 40  minutes    Toribio Hummer, MD [facility]   To contact the attending provider between 7A-7P or the covering provider during after hours 7P-7A, please log into the web site [email or web address] and access using universal [place] password for that web site. If you do not have the password, please call the hospital operator.  12/19/2024, 12:44 PM    "

## 2024-12-19 NOTE — Progress Notes (Signed)
 PT Cancellation Note  Patient Details Name: Henry May MRN: [id number] DOB: 1966/06/27   Cancelled Treatment:    Reason Eval/Treat Not Completed: Other (comment). Pt just back from radiation/CT simulation; continue efforts to complete PT eval   [facility] 12/19/2024, 2:45 PM

## 2024-12-19 NOTE — Progress Notes (Signed)
 OT Cancellation Note  Patient Details Name: Henry May MRN: [id number] DOB: 1966/10/06   Cancelled Treatment:    Reason Eval/Treat Not Completed: Other (comment). Pt recently back from radiation simulation; will perform OT eval as appropriate at later date.   Aime Carreras L. Kayley Zeiders, OTR/L  12/19/2024, 4:40 PM

## 2024-12-19 NOTE — Progress Notes (Signed)
" °  Radiation Oncology         [phone] ________________________________  Name: Athan Casalino MRN: [id number]  Date: 11/27/2024  DOB: [birth year]  SIMULATION AND TREATMENT PLANNING NOTE    ICD-10-CM   1. SBO (small bowel obstruction) (HCC)  K56.609     2. Malignant neoplasm of stomach, unspecified location (HCC)  C16.9 CISplatin  (PLATINOL ) 66 mg in sodium chloride  0.9 % 250 mL chemo infusion    0.9 % NaCl with KCl 20 mEq/ L  infusion    magnesium  sulfate IVPB 2 g 50 mL    palonosetron  (ALOXI ) injection 0.25 mg    fosaprepitant  (EMEND) 150 mg in sodium chloride  0.9 % 145 mL IVPB    dexamethasone  (DECADRON ) injection 10 mg    Infusion Appointment Request    Clinic Appointment Request    Lab Appointment Request    CBC with Differential (Cancer Center Only)    Basic Metabolic Panel - Cancer Center Only    Magnesium     0.9 % NaCl with KCl 20 mEq/ L  infusion    magnesium  sulfate IVPB 2 g 50 mL    palonosetron  (ALOXI ) injection 0.25 mg    fosaprepitant  (EMEND) 150 mg in sodium chloride  0.9 % 145 mL IVPB    dexamethasone  (DECADRON ) injection 10 mg    CISplatin  (PLATINOL ) 66 mg in sodium chloride  0.9 % 250 mL chemo infusion    DISCONTINUED: LORazepam  (ATIVAN ) tablet 0.5 mg    DISCONTINUED: promethazine  (PHENERGAN ) tablet 6.25 mg    3. Coffee ground emesis  K92.0     4. Aspiration pneumonia due to gastric secretions, unspecified laterality, unspecified part of lung (HCC)  J69.0     5. Small bowel obstruction (HCC)  K56.609 DG Abd Portable 1V-Small Bowel Obstruction Protocol-initial, 8 hr delay    DG Abd Portable 1V-Small Bowel Obstruction Protocol-initial, 8 hr delay    6. 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   NARRATIVE:  The patient was brought to the CT Simulation  planning suite.  Identity was confirmed.  All relevant records and images related to the planned course of therapy were reviewed.  The patient freely provided informed written consent to proceed with treatment after reviewing the details related to the planned course of therapy. The consent form was witnessed and verified by the simulation staff.  Then, the patient was set-up in a stable reproducible  supine position for radiation therapy.  CT images were obtained.  Surface markings were placed.  The CT images were loaded into the planning software.  Then the target and avoidance structures were contoured.  Treatment planning then occurred.  The radiation prescription was entered and confirmed.  Then, I designed and supervised the construction of multiple medically necessary complex treatment devices.  I have requested : 3D Simulation  I have requested a DVH of the following structures: spinal cord, kidneys and target.  PLAN:  The patient will receive 4 Gy in 1 fraction then 27 Gy in 9 additional fractions to total of 31 Gy.  ________________________________  Donnice LABOR. Patrcia, M.D.  "

## 2024-12-20 ENCOUNTER — Inpatient Hospital Stay: Payer: Self-pay

## 2024-12-20 ENCOUNTER — Other Ambulatory Visit: Payer: Self-pay

## 2024-12-20 ENCOUNTER — Encounter: Payer: Self-pay | Admitting: Oncology

## 2024-12-20 ENCOUNTER — Inpatient Hospital Stay: Payer: Self-pay | Admitting: Nutrition

## 2024-12-20 ENCOUNTER — Ambulatory Visit: Payer: Self-pay

## 2024-12-20 DIAGNOSIS — J690 Pneumonitis due to inhalation of food and vomit: Secondary | ICD-10-CM | POA: Diagnosis not present

## 2024-12-20 DIAGNOSIS — Z86718 Personal history of other venous thrombosis and embolism: Secondary | ICD-10-CM | POA: Diagnosis not present

## 2024-12-20 DIAGNOSIS — K920 Hematemesis: Secondary | ICD-10-CM

## 2024-12-20 DIAGNOSIS — R112 Nausea with vomiting, unspecified: Secondary | ICD-10-CM | POA: Diagnosis not present

## 2024-12-20 DIAGNOSIS — E43 Unspecified severe protein-calorie malnutrition: Secondary | ICD-10-CM | POA: Diagnosis not present

## 2024-12-20 LAB — BPAM RBC
Blood Product Expiration Date: 202601232359
Blood Product Expiration Date: 202601252359
Blood Product Expiration Date: 202601252359
Blood Product Expiration Date: 202601252359
ISSUE DATE / TIME: 202601011000
ISSUE DATE / TIME: 202601011251
ISSUE DATE / TIME: 202601030859
ISSUE DATE / TIME: 202601031603
Unit Type and Rh: 5100
Unit Type and Rh: 5100
Unit Type and Rh: 5100
Unit Type and Rh: 5100

## 2024-12-20 LAB — TYPE AND SCREEN
ABO/RH(D): O POS
Antibody Screen: NEGATIVE
Unit division: 0
Unit division: 0
Unit division: 0
Unit division: 0

## 2024-12-20 LAB — CBC
HCT: 24 % — ABNORMAL LOW (ref 39.0–52.0)
Hemoglobin: 7.9 g/dL — ABNORMAL LOW (ref 13.0–17.0)
MCH: 29.3 pg (ref 26.0–34.0)
MCHC: 32.9 g/dL (ref 30.0–36.0)
MCV: 88.9 fL (ref 80.0–100.0)
Platelets: 229 K/uL (ref 150–400)
RBC: 2.7 MIL/uL — ABNORMAL LOW (ref 4.22–5.81)
RDW: 15.9 % — ABNORMAL HIGH (ref 11.5–15.5)
WBC: 3.6 K/uL — ABNORMAL LOW (ref 4.0–10.5)
nRBC: 0 % (ref 0.0–0.2)

## 2024-12-20 LAB — GLUCOSE, CAPILLARY
Glucose-Capillary: 130 mg/dL — ABNORMAL HIGH (ref 70–99)
Glucose-Capillary: 125 mg/dL — ABNORMAL HIGH (ref 70–99)
Glucose-Capillary: 119 mg/dL — ABNORMAL HIGH (ref 70–99)

## 2024-12-20 LAB — COMPREHENSIVE METABOLIC PANEL WITH GFR
ALT: 34 U/L (ref 0–44)
AST: 17 U/L (ref 15–41)
Albumin: 2.3 g/dL — ABNORMAL LOW (ref 3.5–5.0)
Alkaline Phosphatase: 134 U/L — ABNORMAL HIGH (ref 38–126)
Anion gap: 7 (ref 5–15)
BUN: 29 mg/dL — ABNORMAL HIGH (ref 6–20)
CO2: 26 mmol/L (ref 22–32)
Calcium: 7.8 mg/dL — ABNORMAL LOW (ref 8.9–10.3)
Chloride: 104 mmol/L (ref 98–111)
Creatinine, Ser: 0.59 mg/dL — ABNORMAL LOW (ref 0.61–1.24)
GFR, Estimated: >60 mL/min
Glucose, Bld: 125 mg/dL — ABNORMAL HIGH (ref 70–99)
Potassium: 3.8 mmol/L (ref 3.5–5.1)
Sodium: 138 mmol/L (ref 135–145)
Total Bilirubin: 0.7 mg/dL (ref 0.0–1.2)
Total Protein: 5.2 g/dL — ABNORMAL LOW (ref 6.5–8.1)

## 2024-12-20 LAB — RAD ONC ARIA SESSION SUMMARY
Course Elapsed Days: 3
Plan Fractions Treated to Date: 1
Plan Prescribed Dose Per Fraction: 3 Gy
Plan Total Fractions Prescribed: 9
Plan Total Prescribed Dose: 27 Gy
Reference Point Dosage Given to Date: 3 Gy
Reference Point Session Dosage Given: 3 Gy
Session Number: 2

## 2024-12-20 LAB — IRON AND TIBC
Iron: 11 ug/dL — ABNORMAL LOW (ref 45–182)
Saturation Ratios: 10 % — ABNORMAL LOW (ref 17.9–39.5)
TIBC: 116 ug/dL — ABNORMAL LOW (ref 250–450)
UIBC: 105 ug/dL

## 2024-12-20 LAB — TRIGLYCERIDES: Triglycerides: 99 mg/dL

## 2024-12-20 LAB — PHOSPHORUS: Phosphorus: 2.5 mg/dL (ref 2.5–4.6)

## 2024-12-20 LAB — MAGNESIUM: Magnesium: 2.1 mg/dL (ref 1.7–2.4)

## 2024-12-20 MED ORDER — SODIUM CHLORIDE 0.9 % IV SOLN
2.0000 mg | Freq: Once | INTRAVENOUS | Status: AC
  Administered 2024-12-20: 2 mg via INTRAVENOUS
  Filled 2024-12-20: qty 0.4

## 2024-12-20 MED ORDER — IRON SUCROSE 200 MG IVPB - SIMPLE MED
200.0000 mg | Freq: Once | Status: AC
  Administered 2024-12-20: 200 mg via INTRAVENOUS
  Filled 2024-12-20: qty 200

## 2024-12-20 MED ORDER — PHENYLEPHRINE HCL-NACL 20-0.9 MG/250ML-% IV SOLN
INTRAVENOUS | Status: AC
  Filled 2024-12-20: qty 500

## 2024-12-20 MED ORDER — TRAVASOL 10 % IV SOLN
INTRAVENOUS | Status: AC
  Filled 2024-12-20: qty 1188

## 2024-12-20 NOTE — Progress Notes (Signed)
 Nutrition follow up cancelled due to inpatient hospital admission.

## 2024-12-20 NOTE — Progress Notes (Signed)
 Henry May is doing okay this morning.  He had some emesis yesterday afternoon.  I think he started radiation therapy.  He will have 10 treatments.  He will have some cisplatin  with the radiation.  He has TPN going for nutrition.  Hopefully, he will be able to take an nutrition by mouth at some point.  He is having small bowel movements.  He has had no fever.  He has had no cough or shortness of breath.  He has had no problems getting out of bed.  There has been no problems with pain.  His labs show a white cell count of 3.6.  Hemoglobin 7.9.  Platelet count 229,000.  Sodium 138.  Potassium 3.8.  BUN 29 creatinine 0.59.  Calcium  7.8 with an albumin  of 2.3.  Vital signs show temperature of 98.2.  Pulse 71.  Blood pressure 99/64.  His head and neck exam shows no scleral icterus.  There is no oral lesions.  He has no adenopathy in the neck.  Lungs are clear bilaterally.  He has good air movement bilaterally.  Cardiac exam regular rate and rhythm.  He has no murmurs, rubs or bruits.  Abdomen is soft.  Bowel sounds are present but slightly decreased.  There is no guarding or rebound tenderness.  He has no fluid wave.  There is no palpable abdominal mass.  There is no palpable liver or spleen tip.  Extremity shows no clubbing, cyanosis or edema.  Neurological exam is nonfocal.  Henry May has metastatic gastric cancer.  This is causing him to have the hematemesis.  He is getting radiotherapy.  He is getting some low-dose cisplatin .  He is on TPN we did give him some nutrition.  This will definitely help him.  We will have to watch his red cell count closely.  We will see what his hemoglobin does.  I will send off iron  studies on him.  He concerning benefit from some folic acid .  I do not know if there is any in the TPN.  I know he is going to get great care from everybody up on 4 W.  I do appreciate everybody's help.  Jeralyn Crease, MD  1 Maude 4:10

## 2024-12-20 NOTE — Progress Notes (Signed)
 PHARMACY - TOTAL PARENTERAL NUTRITION CONSULT NOTE   Indication: intolerance to enteral feeds  Patient Measurements: Height: 6' (182.9 cm) Weight: 74 kg (163 lb 2.3 oz) IBW/kg (Calculated) : 77.6 TPN AdjBW (KG): 70 Body mass index is 22.13 kg/m.  Assessment: 36 yoM admitted on 12/13 with intractable nausea and vomiting, dilated small bowel loops, and coffee-ground emesis. 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  He underwent EGD 12/16 showing esophagitis, gastric stensosis, stent placed, impaired peristalsis. Pharmacy was consulted to dose TPN on 12/20 for intolerance to enteral feeding.    Glucose / Insulin : No hx DM, no PTA meds. A1c 5.7% on 12/14 -BG goal <180. Range: 121-142 (6 units SSI/24 hrs) -Octreotide  infusion resumed 12/25 at 50 mcg/hr (noted that octreotide , especially at higher doses/frequencies, can cause glucose dysregulation) Electrolytes: WNL, including CorrCa (9.1) Renal: SCr < 1, BUN 29 (elevated but stable) Hepatic: WNL, alk phos minimally elevated (trending down) -Trig trending down, WNL at 99 -Alb low at 2.3, but overall stable Intake / Output:  -UOP not documented -Emesis ~ 1200 ml documented last 24 hrs -LBM 1/4 GI Imaging: -12/18 Abd xray: No bowel dilatation or evidence of obstruction. Retained gastric fluid. 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 Prosthesis placed. Duodenal dilation deformity  -12/25 DG abd: nonobstructive bowel gas pattern -12/27 DG abd: nonobstructive bowel gas pattern -12/28 chest/abd CT: Esophageal distention. Nonspecific Enteritis. New mildly enlarged right hilar lymph node and a few slightly prominent bilateral hilar lymph nodes. New small volume of ascites  GI Surgeries / Procedures:  -12/16 EGD: esophagitis  GI meds:   -Protonix  40 mg IV q12h  -Octreotide  infusion at 50 mcg/hr   Central access: Implanted Port (01/22/24) TPN start date: 12/20   Nutritional Goals: Goal TPN  rate is 90 mL/hr and provides 118 g of protein and 2298 kcals per day  RD Assessment: Estimated Needs Total Energy Estimated Needs: 2100-2450 kcals Total Protein Estimated Needs: 105-120 grams Total Fluid Estimated Needs: >/= 2.1L  Current Nutrition:  NPO except for ice chips, sips with meds or for comfort TPN  TPN transitioned back to continuous infusion on 1/1. Once pt is otherwise medically stable and no longer requiring octreotide  infusion, can transition back to cyclic TPN. This can be done in the outpatient setting if necessary. MD aware.  Plan:  Continue TPN at goal rate of 90 mL/hr Electrolytes in TPN: Na 100 mEq/L K 60 mEq/L Ca 2 mEq/L Mg 5 mEq/L Phos 20 mmol/L Cl:Ac  to 1:1 Add standard MVI and trace elements to TPN Continue mSSI q6h Monitor TPN labs on Mon/Thurs   Stefano MARLA Bologna, PharmD, BCPS Clinical Pharmacist 12/20/2024 10:21 AM

## 2024-12-20 NOTE — Progress Notes (Signed)
 SATURATION QUALIFICATIONS: (This note is used to comply with regulatory documentation for home oxygen )  Patient Saturations on Room Air at Rest = 95-97%  Patient Saturations on Room Air while Ambulating = 70-80%  Patient Saturations on 2 Liters of oxygen  while Ambulating = 95-97%  Warrick POUR OTR/L  Acute Rehab Services  [phone] office number

## 2024-12-20 NOTE — Evaluation (Signed)
 Occupational Therapy Evaluation Patient Details Name: Henry May MRN: [id number] DOB: December 31, 1965 Today's Date: 12/20/2024   History of Present Illness   Pt is a 59 y.o. male who presented 11/27/24 from [facility] due to n/v and coffee-ground emesis for 2 days. CT suggested SBO. PMH:  DVT with IVC filter, gastric cancer on chemo and esophagitis     Clinical Impressions Pt presented with wife throughout the session. She reported they have 3STE and one stp into the living room area. Pt at PLOF was very active and going to the gym daily. At this time pt required increase in time but was able to complete ADLS with set up to supervision but his wife will be able to assist as needed with the return to home. He was able to ambulate with RW with supervision but cues on pacing self. At this time Acute Occupational Therapy signing off but recommendation for mobility team to follow.        If plan is discharge home, recommend the following:   Help with stairs or ramp for entrance;Assist for transportation;A little help with bathing/dressing/bathroom;A little help with walking and/or transfers     Functional Status Assessment   Patient has had a recent decline in their functional status and demonstrates the ability to make significant improvements in function in a reasonable and predictable amount of time.     Equipment Recommendations   None recommended by OT     Recommendations for Other Services         Precautions/Restrictions   Precautions Precautions: None Restrictions Weight Bearing Restrictions Per Provider Order: No     Mobility Bed Mobility Overal bed mobility: Modified Independent             General bed mobility comments: HOB elevated but no physical assist    Transfers Overall transfer level: Needs assistance Equipment used: Rolling walker (2 wheels) Transfers: Sit to/from Stand Sit to Stand: Supervision, Contact guard assist                   Balance Overall balance assessment: Needs assistance Sitting-balance support: Feet supported Sitting balance-Leahy Scale: Good     Standing balance support: Bilateral upper extremity supported, No upper extremity supported Standing balance-Leahy Scale: Fair Standing balance comment: pt when ambulating halls used RW but within the room used no DME with supervision to CGA                           ADL either performed or assessed with clinical judgement   ADL Overall ADL's : Needs assistance/impaired Eating/Feeding: Independent;Sitting   Grooming: Wash/dry hands;Modified independent;Supervision/safety;Standing   Upper Body Bathing: Set up;Sitting   Lower Body Bathing: Set up;Sit to/from stand   Upper Body Dressing : Set up;Sitting   Lower Body Dressing: Set up;Sit to/from stand   Toilet Transfer: Set up;Cueing for safety;Cueing for sequencing   Toileting- Clothing Manipulation and Hygiene: Set up;Supervision/safety;Sit to/from stand       Functional mobility during ADLs: Set up;Supervision/safety;Cueing for safety;Cueing for sequencing;Rolling walker (2 wheels)       Vision Baseline Vision/History: 0 No visual deficits Ability to See in Adequate Light: 0 Adequate Vision Assessment?: No apparent visual deficits     Perception Perception: Within Functional Limits       Praxis Praxis: WFL       Pertinent Vitals/Pain Pain Assessment Pain Assessment: No/denies pain     Extremity/Trunk Assessment Upper Extremity Assessment Upper Extremity Assessment: Overall  WFL for tasks assessed   Lower Extremity Assessment Lower Extremity Assessment: Defer to PT evaluation   Cervical / Trunk Assessment Cervical / Trunk Assessment: Normal   Communication Communication Communication: No apparent difficulties   Cognition Arousal: Alert Behavior During Therapy: WFL for tasks assessed/performed Cognition: No apparent impairments             OT -  Cognition Comments: however, educated family about monitoring changes due to tx                 Following commands: Intact       Cueing  General Comments   Cueing Techniques: Verbal cues      Exercises     Shoulder Instructions      Home Living Family/patient expects to be discharged to:: Private residence Living Arrangements: Spouse/significant other;Children Available Help at Discharge: Family Type of Home: House Home Access: Stairs to enter Secretary/administrator of Steps: 3 Entrance Stairs-Rails: None Home Layout: One level (just one step into living room)     Bathroom Shower/Tub: Tub/shower unit;Door   [employer] Toilet: Standard Bathroom Accessibility: Yes How Accessible: Accessible via walker Home Equipment: Agricultural Consultant (2 wheels);Shower seat          Prior Functioning/Environment Prior Level of Function : Independent/Modified Independent             Mobility Comments: very active without DME/AE ADLs Comments: indep    OT Problem List: Decreased activity tolerance;Decreased knowledge of use of DME or AE   OT Treatment/Interventions: Self-care/ADL training;Therapeutic exercise;Therapeutic activities;Patient/family education;Balance training      OT Goals(Current goals can be found in the care plan section)   Acute Rehab OT Goals Patient Stated Goal: to be able to what I can OT Goal Formulation: With patient Time For Goal Achievement: 01/03/25 Potential to Achieve Goals: Fair   OT Frequency:  Min 2X/week    Co-evaluation              AM-PAC OT 6 Clicks Daily Activity     Outcome Measure Help from another person eating meals?: None Help from another person taking care of personal grooming?: None Help from another person toileting, which includes using toliet, bedpan, or urinal?: None Help from another person bathing (including washing, rinsing, drying)?: None Help from another person to put on and taking off regular upper  body clothing?: None Help from another person to put on and taking off regular lower body clothing?: None 6 Click Score: 24   End of Session Equipment Utilized During Treatment: Gait belt;Rolling walker (2 wheels) Nurse Communication: Mobility status  Activity Tolerance: Patient tolerated treatment well Patient left: in bed;with call bell/phone within reach  OT Visit Diagnosis: Muscle weakness (generalized) (M62.81)                Time: 9054-8968 OT Time Calculation (min): 46 min Charges:  OT General Charges $OT Visit: 1 Visit OT Evaluation $OT Eval Low Complexity: 1 Low OT Treatments $Self Care/Home Management : 23-37 mins  Warrick POUR OTR/L  Acute Rehab Services  [phone] office number   Warrick Berber 12/20/2024, 10:45 AM

## 2024-12-20 NOTE — Progress Notes (Signed)
 PT Cancellation Note  Patient Details Name: Henry May MRN: [id number] DOB: Sep 05, 1966   Cancelled Treatment:    Reason Eval/Treat Not Completed: Fatigue/lethargy limiting ability to participate Resting, will check back in AM, try prior to radiation .  [name] PT Acute Rehabilitation Services Office [phone]   [name] Norris 12/20/2024, 2:17 PM

## 2024-12-20 NOTE — Progress Notes (Signed)
 " PROGRESS NOTE    Henry May  FMW:[id number] DOB: September 27, 1966 DOA: 11/27/2024 PCP: [name], DO    No chief complaint on file.   Brief Narrative:  59 year old M with PMH of DVT with IVC filter, gastric cancer on chemo and esophagitis presented to [facility] due to nausea, vomiting and coffee-ground emesis 2 days after he started new chemotherapy, and admitted for intractable nausea and vomiting, dilated small bowel loops and coffee-ground emesis.  Recent EGD on 12/4 with LA grade D esophagitis without bleeding, and large infiltrative, sessile and ulcerated circumferential mass with contact of bleeding in gastric body and antrum with antral narrowing and luminal diameter of approximately 1.5 cm   In ED, stable vitals.  CBC and CMP without significant finding.  Lipase 171.  CT abdomen and pelvis showed dilated proximal small bowel suggesting SBO but without discrete transition point, thick walled gastric body/antrum without definite focal mass, mild nodularity along the pylorus, scarring with superimposed patchy peribronchovascular nodularity in the bilateral lower lobes right more than left suggesting mild aspiration or pneumonia.  Patient was started on antiemetics, IV fluid, PPI and admitted for further care.  Oncology, surgery, GI and palliative medicine consulted.   Patient underwent EGD and gastric stent placement on 12/16.  Was unable to tolerate diet.  Started on TPN.  Plan was for discharge with TPN once logistic in place but nausea and vomiting gotten worse after stopping octreotide  and Decadron .  Also having hemoptysis with drop in hemoglobin.  Restarted on octreotide  infusion.     GI reengaged and order CT chest, abdomen and pelvis that showed new patchy substernal pneumomediastinum of indeterminate source, patchy consolidation in lower lobes and posterior LUL, esophageal distention with retained/reflux fluid in thoracic inlet, emphysematous cystitis and circumferential  gastric thickening with adjacent stranding change, antropyloric stenting and continued duodenal dilation, thickening jejunal folds.   Patient with ongoing bleeding felt secondary to metastatic gastric carcinoma, patient receiving cisplatin  in-house and radiation oncology consulted and patient started palliative radiation.   Assessment & Plan:   Principal Problem:   Hematemesis with nausea Active Problems:   Intractable nausea and vomiting   SBO (small bowel obstruction) (HCC)   Diabetes mellitus without complication (HCC)   Gastric cancer (HCC)   History of DVT (deep vein thrombosis)   Protein-calorie malnutrition, severe   Gastric outlet obstruction   Decreased hemoglobin   Subcutaneous emphysema   Fever, unspecified   Aspiration pneumonia due to gastric secretions (HCC)   Coffee ground emesis  #1 gastric cancer, gastric luminal narrowing and esophagitis/intractable nausea and vomiting/acute blood loss anemia due to hematemesis likely secondary to bleeding from gastric cancer - CT done initially with dilated small bowel without transition point. - Patient seen by GI underwent EGD and gastric stenting 11/30/2024. - Repeat CT angiogram chest abdomen and pelvis as noted above. - Status post transfusion total of 2 units PRBCs on 12/12/2024 with hemoglobin noted at 6.3 on 12/16/2024, patient status posttransfusion 2 units PRBCs (12/16/2024) with hemoglobin noted at 7.1 (12/18/2024 ) status post transfusion 2 units PRBCs with hemoglobin currently at 7.9. - Patient with ongoing emesis with blood in it over the past 3-4 days.  - Patient noted to have had 2 bowel movements the morning of 12/15/2024 and also noted to have a bowel movement daily per wife. - IV Unasyn  resumed on 12/11/2024-12/17/2024. - Patient currently n.p.o. except ice chips and sips with meds. - Continue TPN per pharmacy. - Continue IV antiemetics of Phenergan , Zofran  and Ativan . -  Octreotide  resumed 12/09/2024 per oncology  recommendation. - Continue IV PPI twice daily - Aspiration precautions. -Hemoglobin at 1.9 today. - Patient status posttransfusion 6 times PRBCs during the hospitalization.   - Patient with emesis with blood over the past 3 to 4 days.  -Anemia panel obtained with iron  deficiency anemia. -Oncology recommended radiation oncology evaluation for palliative radiation to gastric mass. -Patient underwent first palliative radiation on 12/17/2024, underwent palliative radiation again today 12/20/2024. -Patient placed on folic acid  per oncology. -Place on IV iron . -GI, oncology, palliative care following.  2.  Gastric cancer  - Patient noted to have recently restarted on chemotherapy and being followed by Dr. Cloretta. - Status post EGD with gastric stenting 11/30/2024. -Patient still with ongoing emesis with blood in it. - Patient noted to have received cisplatin  chemo on 12/07/2024. - Repeat CT chest abdomen and pelvis was done as noted above. - Oncology/status post #3 cisplatin . - Oncology, palliative care following. - Palliative care met with family and underwent family meeting on 12/15/2024 and will meet again today, 12/18/2023.  - Patient noted with bout of emesis with blood in it over the past 4 days.  -Status post transfusion 6 units PRBCs during this hospitalization. - Hemoglobin currently at 7.9. -Oncology consulted with radiation oncology for palliative radiation and underwent first radiation treatment 12/17/2024 and underwent second treatment today, 12/20/2024.   - Per hematology/oncology.   -  3.  Aspiration pneumonitis/pneumonia -Initial chest x-ray done showed patchy airspace opacity in the right middle lobe. - Initial CT abdomen and pelvis with bibasilar infiltrate. - Patient noted to have completed 7 days of appropriate antibiotics. - Patient noted to have spiked a fever on 12/11/2024 with repeat chest x-ray showing stable right lung opacity/infiltrate. - Repeat CT chest abdomen and  pelvis was done as noted above. - IV Unasyn  resumed on 12/11/2024 and discontinued on 12/17/2024.. - Aspiration precautions - Wean oxygen  as able to.  4.  Pneumomediastinum -Noted on CT chest. - Felt could likely be secondary to retching and vomiting. - Follow-up.  5.  Diet-controlled diabetes with hyperglycemia -Hemoglobin A1c 5.7% (11/28/2024) - CBG noted at 130 this morning. - Continue TPN per pharmacy. - SSI.  6.  History of DVT - Status post IVC filter.    7.  Elevated lipase -Improved.  8.  Insomnia mainly due to intractable nausea and vomiting -IV Ativan  and Phenergan  as above. - Patient also on octreotide  per oncology.  9.  Lightheadedness/presyncope -Likely secondary to intractable nausea vomiting hematemesis and volume depletion. -Improved. - On TPN and IV fluids.  10.  Severe malnutrition - BMI of 22.1 kg/m. - Likely secondary to chronic illness of gastric cancer. - Patient with severe fat depletion, severe muscle depletion. - Patient on TPN. - RD following.   DVT prophylaxis: SCDs Code Status: DNR Family Communication: Updated patient, wife at bedside. Disposition: TBD  Status is: Inpatient Remains inpatient appropriate because: Severity of illness   Consultants:  Gastroenterology: Dr. Charlanne 11/28/2024 Palliative care: Dr. Antonette 11/28/2024 General Surgery: Dr. Curvin III 11/30/2024 Oncology: Dr. Cloretta Radiation oncology  Procedures:  CT abdomen and pelvis 11/27/2024 CT chest abdomen and pelvis 12/12/2024 Upper endoscopy per GI: Dr. Wilhelmenia 11/30/2024 Status post transfusion 6 units PRBCs.   Antimicrobials:  Anti-infectives (From admission, onward)    Start     Dose/Rate Route Frequency Ordered Stop   12/11/24 0900  Ampicillin -Sulbactam (UNASYN ) 3 g in sodium chloride  0.9 % 100 mL IVPB        3 g  200 mL/hr over 30 Minutes Intravenous Every 6 hours 12/11/24 0817 12/18/24 0950   11/28/24 1200  Ampicillin -Sulbactam (UNASYN ) 3 g in sodium  chloride 0.9 % 100 mL IVPB  Status:  Discontinued        3 g 200 mL/hr over 30 Minutes Intravenous Every 6 hours 11/28/24 1048 12/05/24 0909   11/28/24 0045  Ampicillin -Sulbactam (UNASYN ) 3 g in sodium chloride  0.9 % 100 mL IVPB        3 g 200 mL/hr over 30 Minutes Intravenous  Once 11/28/24 0042 11/28/24 0134         Subjective: Patient laying in bed, just returned from radiation treatment.  Denies any chest pain or shortness of breath.  Per wife patient with 600 cc of emesis with blood in it yesterday and subsequently another 300 cc.  Patient noted with a brownish productive cough per wife.    Objective: Vitals:   12/19/24 1356 12/19/24 2357 12/20/24 0609 12/20/24 1100  BP: 106/63 104/68 99/64 109/65  Pulse: 73 79 71   Resp: 17 20 20    Temp: 98.2 F (36.8 C) 98.5 F (36.9 C) 98.2 F (36.8 C)   TempSrc: Oral Oral Oral   SpO2: 99% 100% 100%   Weight:      Height:        Intake/Output Summary (Last 24 hours) at 12/20/2024 1201 Last data filed at 12/20/2024 0524 Gross per 24 hour  Intake 2653.45 ml  Output 1225 ml  Net 1428.45 ml   Filed Weights   12/17/24 0500 12/18/24 0500 12/19/24 0623  Weight: 79.3 kg 78.4 kg 74 kg    Examination:  General exam: NAD. Respiratory system: Bibasilar crackles.  No wheezing, no rhonchi.  Fair air movement.  Speaking in full sentences.  No use of accessory muscles of respiration.  Cardiovascular system: RRR no murmurs rubs or gallops.  No JVD.  No lower extremity edema.  Gastrointestinal system: Abdomen soft, nontender, nondistended, positive bowel sounds.  No rebound.  No guarding.  Central nervous system: Alert and oriented. No focal neurological deficits. Extremities: Symmetric 5 x 5 power. Skin: No rashes, lesions or ulcers Psychiatry: Judgement and insight appear normal. Mood & affect appropriate.     Data Reviewed: I have personally reviewed following labs and imaging studies  CBC: Recent Labs  Lab 12/14/24 0408  12/15/24 0420 12/16/24 0423 12/16/24 1750 12/17/24 0341 12/18/24 0513 12/18/24 2034 12/19/24 0446 12/19/24 1624 12/20/24 0351  WBC 5.1 4.5 3.7*  --  4.5 3.7*  --  2.5*  --  3.6*  NEUTROABS 3.9 3.3 3.0  --   --   --   --   --   --   --   HGB 8.8* 7.5* 6.3*   < > 9.2* 7.1* 9.2* 9.7* 8.7* 7.9*  HCT 26.9* 23.2* 19.1*   < > 27.8* 21.9* 26.8* 28.8* 25.6* 24.0*  MCV 93.7 94.3 93.2  --  90.6 91.3  --  87.3  --  88.9  PLT 210 209 210  --  272 239  --  256  --  229   < > = values in this interval not displayed.    Basic Metabolic Panel: Recent Labs  Lab 12/14/24 0408 12/15/24 0420 12/16/24 0423 12/17/24 0341 12/18/24 0513 12/19/24 0446 12/20/24 0351  NA 138 137 135 137 135 135 138  K 4.3 4.3 4.8 4.0 4.2 3.8 3.8  CL 103 102 104 103 101 101 104  CO2 24 26 23 25 24 25  26  GLUCOSE 155* 161* 223* 131* 144* 122* 125*  BUN 26* 28* 28* 31* 29* 29* 29*  CREATININE 0.70 0.66 0.57* 0.66 0.59* 0.64 0.59*  CALCIUM  8.1* 8.0* 7.8* 7.9* 7.4* 7.8* 7.8*  MG 2.1  --  2.4 2.1 1.9  --  2.1  PHOS 3.1 3.0 2.6 2.9 2.6  --  2.5    GFR: Estimated Creatinine Clearance: 102.8 mL/min (A) (by C-G formula based on SCr of 0.59 mg/dL (L)).  Liver Function Tests: Recent Labs  Lab 12/14/24 0408 12/15/24 0420 12/16/24 0423 12/20/24 0351  AST 62* 34 29 17  ALT 81* 65* 56* 34  ALKPHOS 161* 147* 133* 134*  BILITOT 0.4 0.6 0.4 0.7  PROT 5.3* 5.4* 5.1* 5.2*  ALBUMIN  2.5* 2.4* 2.3* 2.3*    CBG: Recent Labs  Lab 12/19/24 0623 12/19/24 1143 12/19/24 1750 12/19/24 2354 12/20/24 0604  GLUCAP 142* 138* 121* 139* 130*     No results found for this or any previous visit (from the past 240 hours).       Radiology Studies: No results found.      Scheduled Meds:  Chlorhexidine  Gluconate Cloth  6 each Topical Daily   insulin  aspart  0-15 Units Subcutaneous Q6H   mouth rinse  15 mL Mouth Rinse 4 times per day   pantoprazole  (PROTONIX ) IV  40 mg Intravenous Q12H   phenylephrine         sucralfate   1 g Oral TID   Continuous Infusions:  octreotide  (SANDOSTATIN ) 500 mcg in sodium chloride  0.9 % 250 mL (2 mcg/mL) infusion 50 mcg/hr (12/20/24 1156)   promethazine  (PHENERGAN ) injection (IM or IVPB) 25 mg (12/20/24 0958)   TPN ADULT (ION) 90 mL/hr at 12/19/24 1759   TPN ADULT (ION)       LOS: 22 days    Time spent: 40 minutes    Toribio Hummer, MD [facility]   To contact the attending provider between 7A-7P or the covering provider during after hours 7P-7A, please log into the web site [email or web address] and access using universal [place] password for that web site. If you do not have the password, please call the hospital operator.  12/20/2024, 12:01 PM    "

## 2024-12-20 NOTE — Plan of Care (Signed)

## 2024-12-21 ENCOUNTER — Encounter: Payer: Self-pay | Admitting: Oncology

## 2024-12-21 ENCOUNTER — Other Ambulatory Visit: Payer: Self-pay

## 2024-12-21 ENCOUNTER — Ambulatory Visit: Payer: Self-pay

## 2024-12-21 DIAGNOSIS — E43 Unspecified severe protein-calorie malnutrition: Secondary | ICD-10-CM | POA: Diagnosis not present

## 2024-12-21 DIAGNOSIS — J690 Pneumonitis due to inhalation of food and vomit: Secondary | ICD-10-CM | POA: Diagnosis not present

## 2024-12-21 DIAGNOSIS — R112 Nausea with vomiting, unspecified: Secondary | ICD-10-CM | POA: Diagnosis not present

## 2024-12-21 DIAGNOSIS — E119 Type 2 diabetes mellitus without complications: Secondary | ICD-10-CM | POA: Diagnosis not present

## 2024-12-21 DIAGNOSIS — Z86718 Personal history of other venous thrombosis and embolism: Secondary | ICD-10-CM | POA: Diagnosis not present

## 2024-12-21 DIAGNOSIS — K920 Hematemesis: Secondary | ICD-10-CM | POA: Diagnosis not present

## 2024-12-21 DIAGNOSIS — C163 Malignant neoplasm of pyloric antrum: Secondary | ICD-10-CM | POA: Diagnosis not present

## 2024-12-21 LAB — CBC WITH DIFFERENTIAL/PLATELET
Abs Immature Granulocytes: 0.06 K/uL (ref 0.00–0.07)
Basophils Absolute: 0 K/uL (ref 0.0–0.1)
Basophils Relative: 1 %
Eosinophils Absolute: 0 K/uL (ref 0.0–0.5)
Eosinophils Relative: 2 %
HCT: 24.3 % — ABNORMAL LOW (ref 39.0–52.0)
Hemoglobin: 8.1 g/dL — ABNORMAL LOW (ref 13.0–17.0)
Immature Granulocytes: 2 %
Lymphocytes Relative: 16 %
Lymphs Abs: 0.4 K/uL — ABNORMAL LOW (ref 0.7–4.0)
MCH: 29.9 pg (ref 26.0–34.0)
MCHC: 33.3 g/dL (ref 30.0–36.0)
MCV: 89.7 fL (ref 80.0–100.0)
Monocytes Absolute: 0.2 K/uL (ref 0.1–1.0)
Monocytes Relative: 6 %
Neutro Abs: 1.9 K/uL (ref 1.7–7.7)
Neutrophils Relative %: 73 %
Platelets: 245 K/uL (ref 150–400)
RBC: 2.71 MIL/uL — ABNORMAL LOW (ref 4.22–5.81)
RDW: 15.8 % — ABNORMAL HIGH (ref 11.5–15.5)
WBC: 2.6 K/uL — ABNORMAL LOW (ref 4.0–10.5)
nRBC: 0 % (ref 0.0–0.2)

## 2024-12-21 LAB — RAD ONC ARIA SESSION SUMMARY
Course Elapsed Days: 4
Plan Fractions Treated to Date: 2
Plan Prescribed Dose Per Fraction: 3 Gy
Plan Total Fractions Prescribed: 9
Plan Total Prescribed Dose: 27 Gy
Reference Point Dosage Given to Date: 6 Gy
Reference Point Session Dosage Given: 3 Gy
Session Number: 3

## 2024-12-21 LAB — GLUCOSE, CAPILLARY
Glucose-Capillary: 133 mg/dL — ABNORMAL HIGH (ref 70–99)
Glucose-Capillary: 123 mg/dL — ABNORMAL HIGH (ref 70–99)
Glucose-Capillary: 132 mg/dL — ABNORMAL HIGH (ref 70–99)
Glucose-Capillary: 122 mg/dL — ABNORMAL HIGH (ref 70–99)

## 2024-12-21 LAB — BASIC METABOLIC PANEL WITH GFR
Anion gap: 6 (ref 5–15)
BUN: 26 mg/dL — ABNORMAL HIGH (ref 6–20)
CO2: 27 mmol/L (ref 22–32)
Calcium: 7.7 mg/dL — ABNORMAL LOW (ref 8.9–10.3)
Chloride: 105 mmol/L (ref 98–111)
Creatinine, Ser: 0.52 mg/dL — ABNORMAL LOW (ref 0.61–1.24)
GFR, Estimated: >60 mL/min
Glucose, Bld: 134 mg/dL — ABNORMAL HIGH (ref 70–99)
Potassium: 4.1 mmol/L (ref 3.5–5.1)
Sodium: 138 mmol/L (ref 135–145)

## 2024-12-21 MED ORDER — TRAVASOL 10 % IV SOLN
INTRAVENOUS | Status: AC
  Filled 2024-12-21: qty 1188

## 2024-12-21 MED ORDER — SODIUM CHLORIDE 0.9 % IV SOLN
250.0000 mg | Freq: Every day | INTRAVENOUS | Status: AC
  Administered 2024-12-21 – 2024-12-24 (×4): 250 mg via INTRAVENOUS
  Filled 2024-12-21 (×4): qty 20

## 2024-12-21 NOTE — Progress Notes (Signed)
 " PROGRESS NOTE    Henry May  FMW:[id number] DOB: 06-03-1966 DOA: 11/27/2024 PCP: [name], DO    No chief complaint on file.   Brief Narrative:  59 year old M with PMH of DVT with IVC filter, gastric cancer on chemo and esophagitis presented to [facility] ED due to nausea, vomiting and coffee-ground emesis 2 days after he started new chemotherapy, and admitted for intractable nausea and vomiting, dilated small bowel loops and coffee-ground emesis.  Recent EGD on 12/4 with LA grade D esophagitis without bleeding, and large infiltrative, sessile and ulcerated circumferential mass with contact of bleeding in gastric body and antrum with antral narrowing and luminal diameter of approximately 1.5 cm   In ED, stable vitals.  CBC and CMP without significant finding.  Lipase 171.  CT abdomen and pelvis showed dilated proximal small bowel suggesting SBO but without discrete transition point, thick walled gastric body/antrum without definite focal mass, mild nodularity along the pylorus, scarring with superimposed patchy peribronchovascular nodularity in the bilateral lower lobes right more than left suggesting mild aspiration or pneumonia.  Patient was started on antiemetics, IV fluid, PPI and admitted for further care.  Oncology, surgery, GI and palliative medicine consulted.   Patient underwent EGD and gastric stent placement on 12/16.  Was unable to tolerate diet.  Started on TPN.  Plan was for discharge with TPN once logistic in place but nausea and vomiting gotten worse after stopping octreotide  and Decadron .  Also having hemoptysis with drop in hemoglobin.  Restarted on octreotide  infusion.     GI reengaged and order CT chest, abdomen and pelvis that showed new patchy substernal pneumomediastinum of indeterminate source, patchy consolidation in lower lobes and posterior LUL, esophageal distention with retained/reflux fluid in thoracic inlet, emphysematous cystitis and circumferential  gastric thickening with adjacent stranding change, antropyloric stenting and continued duodenal dilation, thickening jejunal folds.   Patient with ongoing bleeding felt secondary to metastatic gastric carcinoma, patient receiving cisplatin  in-house and radiation oncology consulted and patient started palliative radiation.   Assessment & Plan:   Principal Problem:   Hematemesis with nausea Active Problems:   Intractable nausea and vomiting   SBO (small bowel obstruction) (HCC)   Diabetes mellitus without complication (HCC)   Gastric cancer (HCC)   History of DVT (deep vein thrombosis)   Protein-calorie malnutrition, severe   Gastric outlet obstruction   Decreased hemoglobin   Subcutaneous emphysema   Fever, unspecified   Aspiration pneumonia due to gastric secretions (HCC)   Coffee ground emesis  #1 gastric cancer, gastric luminal narrowing and esophagitis/intractable nausea and vomiting/acute blood loss anemia due to hematemesis likely secondary to bleeding from gastric cancer - CT done initially with dilated small bowel without transition point. - Patient seen by GI underwent EGD and gastric stenting 11/30/2024. - Repeat CT angiogram chest abdomen and pelvis as noted above. - Status post transfusion total of 2 units PRBCs on 12/12/2024 with hemoglobin noted at 6.3 on 12/16/2024, patient status posttransfusion 2 units PRBCs (12/16/2024) with hemoglobin noted at 7.1 (12/18/2024 ) status post transfusion 2 units PRBCs with hemoglobin currently at 8.1. - Patient with ongoing emesis with blood in it over the past 4-5 days.  - Patient noted to have had 2 bowel movements the morning of 12/15/2024 and also noted to have a bowel movement daily per wife. - IV Unasyn  resumed on 12/11/2024-12/17/2024. - Patient currently n.p.o. except ice chips and sips with meds. - Continue TPN per pharmacy. - Continue IV antiemetics of Phenergan , Zofran  and Ativan . -  Octreotide  resumed 12/09/2024 per oncology  recommendation. - Continue IV PPI twice daily - Aspiration precautions. - Patient status posttransfusion 6 times PRBCs during the hospitalization.   - Patient with emesis with blood over the past 4-5 days.  -Anemia panel obtained with iron  deficiency anemia. -Oncology recommended radiation oncology evaluation for palliative radiation to gastric mass. -Patient underwent first palliative radiation on 12/17/2024, underwent palliative radiation again today 12/20/2024. -Patient placed on folic acid  per oncology. - Patient started on IV iron  on 12/20/2024 which we will continue.. -GI, oncology, palliative care following.  2.  Gastric cancer  - Patient noted to have recently restarted on chemotherapy and being followed by Dr. Cloretta. - Status post EGD with gastric stenting 11/30/2024. -Patient still with ongoing emesis with blood in it. - Patient noted to have received cisplatin  chemo on 12/07/2024. - Repeat CT chest abdomen and pelvis was done as noted above. - Oncology/status post #3 cisplatin . - Oncology, palliative care following. - Palliative care met with family and underwent family meeting on 12/15/2024 and will meet again today, 12/18/2023.  - Patient noted with bout of emesis with blood in it over the past 4 days.  -Status post transfusion 6 units PRBCs during this hospitalization. - Hemoglobin currently at 8.1. -Oncology consulted with radiation oncology for palliative radiation and underwent first radiation treatment 12/17/2024 and radiation treatments on 12/20/2024, 12/21/2024.     - Per hematology/oncology.   -  3.  Aspiration pneumonitis/pneumonia -Initial chest x-ray done showed patchy airspace opacity in the right middle lobe. - Initial CT abdomen and pelvis with bibasilar infiltrate. - Patient noted to have completed 7 days of appropriate antibiotics. - Patient noted to have spiked a fever on 12/11/2024 with repeat chest x-ray showing stable right lung opacity/infiltrate. - Repeat CT  chest abdomen and pelvis was done as noted above. - IV Unasyn  resumed on 12/11/2024 and discontinued on 12/17/2024.. - Aspiration precautions - Wean oxygen  as able to.  4.  Pneumomediastinum -Noted on CT chest. - Felt could likely be secondary to retching and vomiting. - Follow-up.  5.  Diet-controlled diabetes with hyperglycemia -Hemoglobin A1c 5.7% (11/28/2024) - CBG noted at 123 this morning. - Continue TPN per pharmacy. - SSI.  6.  History of DVT - Status post IVC filter.    7.  Elevated lipase -Improved.  8.  Insomnia mainly due to intractable nausea and vomiting -IV Ativan  and Phenergan  as above. - Patient also on octreotide  per oncology.  9.  Lightheadedness/presyncope -Likely secondary to intractable nausea vomiting hematemesis and volume depletion. -Improved. - On TPN and IV fluids.  10.  Severe malnutrition - BMI of 22.1 kg/m. - Likely secondary to chronic illness of gastric cancer. - Patient with severe fat depletion, severe muscle depletion. - Patient on TPN. - RD following.   DVT prophylaxis: SCDs Code Status: DNR Family Communication: Updated patient, daughters at bedside.  Disposition: TBD  Status is: Inpatient Remains inpatient appropriate because: Severity of illness   Consultants:  Gastroenterology: Dr. Charlanne 11/28/2024 Palliative care: Dr. Antonette 11/28/2024 General Surgery: Dr. Curvin III 11/30/2024 Oncology: Dr. Cloretta Radiation oncology  Procedures:  CT abdomen and pelvis 11/27/2024 CT chest abdomen and pelvis 12/12/2024 Upper endoscopy per GI: Dr. Wilhelmenia 11/30/2024 Status post transfusion 6 units PRBCs.   Antimicrobials:  Anti-infectives (From admission, onward)    Start     Dose/Rate Route Frequency Ordered Stop   12/11/24 0900  Ampicillin -Sulbactam (UNASYN ) 3 g in sodium chloride  0.9 % 100 mL IVPB  3 g 200 mL/hr over 30 Minutes Intravenous Every 6 hours 12/11/24 0817 12/18/24 0950   11/28/24 1200  Ampicillin -Sulbactam  (UNASYN ) 3 g in sodium chloride  0.9 % 100 mL IVPB  Status:  Discontinued        3 g 200 mL/hr over 30 Minutes Intravenous Every 6 hours 11/28/24 1048 12/05/24 0909   11/28/24 0045  Ampicillin -Sulbactam (UNASYN ) 3 g in sodium chloride  0.9 % 100 mL IVPB        3 g 200 mL/hr over 30 Minutes Intravenous  Once 11/28/24 0042 11/28/24 0134         Subjective: Patient sitting up in bed.  Daughters at bedside.  Patient denies any chest pain.  Per daughter patient noted with shortness of breath overnight.  Per daughter patient noted 1 bout of emesis which was brownish in color last night and this morning.  Currently not significantly short of breath.  Denies any chest pain.  No abdominal pain.  Noted to have undergone radiation treatment again today.  Patient and family asking whether patient will undergo chemotherapy today.     Objective: Vitals:   12/20/24 1332 12/20/24 2010 12/21/24 0532 12/21/24 0536  BP: 106/60 114/69 117/68   Pulse: 73 73 73   Resp: 19 18 19    Temp: 97.6 F (36.4 C) 98.7 F (37.1 C) 98.6 F (37 C)   TempSrc: Oral  Oral   SpO2: 92% 100% 98%   Weight:    74.4 kg  Height:        Intake/Output Summary (Last 24 hours) at 12/21/2024 1222 Last data filed at 12/21/2024 0845 Gross per 24 hour  Intake 2669.12 ml  Output 1850 ml  Net 819.12 ml   Filed Weights   12/18/24 0500 12/19/24 0623 12/21/24 0536  Weight: 78.4 kg 74 kg 74.4 kg    Examination:  General exam: NAD. Respiratory system: Bibasilar crackles.  No wheezing, no rhonchi, fair air movement.  Speaking in full sentences.  No use of accessory muscles of respiration.  Cardiovascular system: Regular rate and rhythm no murmurs rubs or gallops.  No JVD.  No pitting lower extremity edema.  Gastrointestinal system: Abdomen is soft, nontender, nondistended, positive bowel sounds.  No rebound.  No guarding. Central nervous system: Alert and oriented. No focal neurological deficits. Extremities: Symmetric 5 x 5  power. Skin: No rashes, lesions or ulcers Psychiatry: Judgement and insight appear normal. Mood & affect appropriate.     Data Reviewed: I have personally reviewed following labs and imaging studies  CBC: Recent Labs  Lab 12/15/24 0420 12/16/24 0423 12/16/24 1750 12/17/24 0341 12/18/24 0513 12/18/24 2034 12/19/24 0446 12/19/24 1624 12/20/24 0351 12/21/24 0416  WBC 4.5 3.7*  --  4.5 3.7*  --  2.5*  --  3.6* 2.6*  NEUTROABS 3.3 3.0  --   --   --   --   --   --   --  1.9  HGB 7.5* 6.3*   < > 9.2* 7.1* 9.2* 9.7* 8.7* 7.9* 8.1*  HCT 23.2* 19.1*   < > 27.8* 21.9* 26.8* 28.8* 25.6* 24.0* 24.3*  MCV 94.3 93.2  --  90.6 91.3  --  87.3  --  88.9 89.7  PLT 209 210  --  272 239  --  256  --  229 245   < > = values in this interval not displayed.    Basic Metabolic Panel: Recent Labs  Lab 12/15/24 0420 12/16/24 0423 12/17/24 0341 12/18/24 0513 12/19/24 0446 12/20/24  0351 12/21/24 0416  NA 137 135 137 135 135 138 138  K 4.3 4.8 4.0 4.2 3.8 3.8 4.1  CL 102 104 103 101 101 104 105  CO2 26 23 25 24 25 26 27   GLUCOSE 161* 223* 131* 144* 122* 125* 134*  BUN 28* 28* 31* 29* 29* 29* 26*  CREATININE 0.66 0.57* 0.66 0.59* 0.64 0.59* 0.52*  CALCIUM  8.0* 7.8* 7.9* 7.4* 7.8* 7.8* 7.7*  MG  --  2.4 2.1 1.9  --  2.1  --   PHOS 3.0 2.6 2.9 2.6  --  2.5  --     GFR: Estimated Creatinine Clearance: 103.3 mL/min (A) (by C-G formula based on SCr of 0.52 mg/dL (L)).  Liver Function Tests: Recent Labs  Lab 12/15/24 0420 12/16/24 0423 12/20/24 0351  AST 34 29 17  ALT 65* 56* 34  ALKPHOS 147* 133* 134*  BILITOT 0.6 0.4 0.7  PROT 5.4* 5.1* 5.2*  ALBUMIN  2.4* 2.3* 2.3*    CBG: Recent Labs  Lab 12/20/24 0604 12/20/24 1220 12/20/24 1735 12/21/24 0059 12/21/24 0530  GLUCAP 130* 125* 119* 133* 123*     No results found for this or any previous visit (from the past 240 hours).       Radiology Studies: No results found.      Scheduled Meds:  Chlorhexidine  Gluconate  Cloth  6 each Topical Daily   insulin  aspart  0-15 Units Subcutaneous Q6H   mouth rinse  15 mL Mouth Rinse 4 times per day   pantoprazole  (PROTONIX ) IV  40 mg Intravenous Q12H   sucralfate   1 g Oral TID   Continuous Infusions:  ferric gluconate (FERRLECIT) IVPB 250 mg (12/21/24 0903)   octreotide  (SANDOSTATIN ) 500 mcg in sodium chloride  0.9 % 250 mL (2 mcg/mL) infusion 50 mcg/hr (12/21/24 0900)   promethazine  (PHENERGAN ) injection (IM or IVPB) 25 mg (12/21/24 1012)   TPN ADULT (ION) 90 mL/hr at 12/20/24 1812   TPN ADULT (ION)       LOS: 23 days    Time spent: 40 minutes    Toribio Hummer, MD [facility]   To contact the attending provider between 7A-7P or the covering provider during after hours 7P-7A, please log into the web site [email or web address] and access using universal [place] password for that web site. If you do not have the password, please call the hospital operator.  12/21/2024, 12:22 PM    "

## 2024-12-21 NOTE — Progress Notes (Signed)
 Mr. Ternes is doing okay this morning.  He has more emesis last night.  It is hard to say how much blood was in there.  However, his hemoglobin is holding pretty steady right now.  His CBC shows white count 2.6.  Hemoglobin 8.1.  Platelet count 245,000.  His sodium is 138.  Potassium 4.1.  BUN 26 creatinine 0.52.  Calcium  7.7.  I did find out that his iron  is on the low side.  His iron  saturation only 10%.  He really needs some IV iron .  I will set him up for IV iron .  We will give this to him daily for 4 days.  He did have radiation.  He is due for his next dose of chemotherapy today.  I will have to check to see if he will be able to receive this.  He is on TPN right now.  He really wants to be able to eat.  Hopefully, he will be able to eat once we get some tumor regression.  Currently, he does have a stent in place.   His vital signs all look pretty stable.  Temperature 98.6.  Pulse 73.  Blood pressure 117/68.  His head and exam shows no ocular or oral lesions.  He has no palpable cervical or supraclavicular lymph nodes.  Lungs are relatively clear bilaterally.  He has good air movement bilaterally.  Cardiac exam regular rate and rhythm.  He has no murmurs.  Abdomen soft.  Bowel sounds are somewhat decreased.  There is no guarding or rebound tenderness.  There is no fluid wave.  There is no obvious abdominal mass.  Extremity shows no clubbing, cyanosis or edema.  We will continue him on the radiation and weekly platinum.  Hopefully, we can get some tumor regression so that he will be able to eat again.  We will give him IV iron .  I think this will help with his anemia.  I do appreciate the great care he is getting from everybody up on 4 W.   Jeralyn Crease, MD  Ila 60:1

## 2024-12-21 NOTE — Plan of Care (Signed)
" °  Problem: Clinical Measurements: Goal: Ability to maintain clinical measurements within normal limits will improve Outcome: Progressing Goal: Diagnostic test results will improve Outcome: Progressing Goal: Respiratory complications will improve Outcome: Progressing Goal: Cardiovascular complication will be avoided Outcome: Progressing   Problem: Nutrition: Goal: Adequate nutrition will be maintained Outcome: Progressing   Problem: Metabolic: Goal: Ability to maintain appropriate glucose levels will improve Outcome: Progressing   Problem: Nutritional: Goal: Maintenance of adequate nutrition will improve Outcome: Progressing   "

## 2024-12-21 NOTE — Evaluation (Addendum)
 Physical Therapy Evaluation Only Patient Details Name: Henry May MRN: [id number] DOB: 16-Nov-1966 Today's Date: 12/21/2024  History of Present Illness  Pt is a 59 y.o. male who presented 11/27/24 from [facility] due to n/v and coffee-ground emesis for 2 days. CT suggested SBO. S/p EGD with gastric stenting 11/30/2024, received cisplatin  chemo on 12/07/2024, s/p 6 units PRBCs during hospitalization, palliative radiation treatment 12/17/2024 and second treatment 12/20/2024.  PMH:  DVT with IVC filter, gastric cancer on chemo and esophagitis  Clinical Impression  PTA, pt reports ind without AD, active, lives in single level home with 3 steps to enter, lives with spouse and son and daughter is present PRN. On eval, pt modified ind to supv with all mobility, therapist managing lines for safety. Pt able to amb 350 ft x3 reps with seated rest break between, HR 80s-90s and SpO2 96% on 2L O2. Pt pleasant, motivated, able to make needs known, daughter at bedside interpreting as needed per pt request. Educated pt on mobilizing with nursing and mobility team as able to maintain strength and endurance. Educated pt an daughter on home pulse ox device for monitoring with pacing as needed and both verbalized understanding.  No further acute PT needs identified; will sign off.      If plan is discharge home, recommend the following:     Can travel by private vehicle        Equipment Recommendations None recommended by PT  Recommendations for Other Services       Functional Status Assessment Patient has had a recent decline in their functional status and demonstrates the ability to make significant improvements in function in a reasonable and predictable amount of time.     Precautions / Restrictions Precautions Recall of Precautions/Restrictions: Intact Precaution/Restrictions Comments: lines Restrictions Weight Bearing Restrictions Per Provider Order: No      Mobility  Bed Mobility Overal bed  mobility: Modified Independent             General bed mobility comments: HOB elevated, supine/sit without assist using bedrails as needed    Transfers Overall transfer level: Needs assistance Equipment used: Rolling walker (2 wheels) Transfers: Sit to/from Stand Sit to Stand: Supervision           General transfer comment: BUE assisting to power up, therapist managing lines for safety    Ambulation/Gait Ambulation/Gait assistance: Supervision, Modified independent (Device/Increase time) Gait Distance (Feet): 350 Feet (x3) Assistive device: Rolling walker (2 wheels) Gait Pattern/deviations: WFL(Within Functional Limits) Gait velocity: WFL     General Gait Details: step through gait pattern, able to navigate around obstacles in hallway and complete turns with good safety awareness, on 2L with SpO2 96% and HR noted  Stairs            Wheelchair Mobility     Tilt Bed    Modified Rankin (Stroke Patients Only)       Balance Overall balance assessment: No apparent balance deficits (not formally assessed)                                           Pertinent Vitals/Pain Pain Assessment Pain Assessment: No/denies pain    Home Living Family/patient expects to be discharged to:: Private residence Living Arrangements: Spouse/significant other;Children Available Help at Discharge: Family Type of Home: House Home Access: Stairs to enter Entrance Stairs-Rails: None Entrance Stairs-Number of Steps: 3  Home Layout: One level (just one step into living room) Home Equipment: Rolling Walker (2 wheels);Shower seat      Prior Function Prior Level of Function : Independent/Modified Independent             Mobility Comments: very active without DME/AE ADLs Comments: indep     Extremity/Trunk Assessment   Upper Extremity Assessment Upper Extremity Assessment: Defer to OT evaluation    Lower Extremity Assessment Lower Extremity  Assessment: Overall WFL for tasks assessed    Cervical / Trunk Assessment Cervical / Trunk Assessment: Normal  Communication   Communication Communication: No apparent difficulties    Cognition Arousal: Alert Behavior During Therapy: WFL for tasks assessed/performed   PT - Cognitive impairments: No apparent impairments                         Following commands: Intact       Cueing Cueing Techniques: Verbal cues     General Comments      Exercises     Assessment/Plan    PT Assessment Patient does not need any further PT services  PT Problem List         PT Treatment Interventions      PT Goals (Current goals can be found in the Care Plan section)  Acute Rehab PT Goals Patient Stated Goal: regain strength PT Goal Formulation: All assessment and education complete, DC therapy    Frequency       Co-evaluation               AM-PAC PT 6 Clicks Mobility  Outcome Measure Help needed turning from your back to your side while in a flat bed without using bedrails?: None Help needed moving from lying on your back to sitting on the side of a flat bed without using bedrails?: None Help needed moving to and from a bed to a chair (including a wheelchair)?: None Help needed standing up from a chair using your arms (e.g., wheelchair or bedside chair)?: None Help needed to walk in hospital room?: None Help needed climbing 3-5 steps with a railing? : A Little 6 Click Score: 23    End of Session   Activity Tolerance: Patient tolerated treatment well Patient left: in bed;with call bell/phone within reach;with bed alarm set;with family/visitor present Nurse Communication: Mobility status;Other (comment) (tele stickers detached and RN going into room to reattach)      Time: 1345-1421 PT Time Calculation (min) (ACUTE ONLY): 36 min   Charges:   PT Evaluation $PT Eval Low Complexity: 1 Low PT Treatments $Gait Training: 8-22 mins PT General Charges $$  ACUTE PT VISIT: 1 Visit         Tori Ivon Oelkers PT, DPT 12/21/2024, 2:38 PM

## 2024-12-21 NOTE — Progress Notes (Signed)
 PHARMACY - TOTAL PARENTERAL NUTRITION CONSULT NOTE   Indication: intolerance to enteral feeds  Patient Measurements: Height: 6' (182.9 cm) Weight: 74.4 kg (164 lb 0.4 oz) IBW/kg (Calculated) : 77.6 TPN AdjBW (KG): 70 Body mass index is 22.25 kg/m.  Assessment: 41 yoM admitted on 12/13 with intractable nausea and vomiting, dilated small bowel loops, and coffee-ground emesis. 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  He underwent EGD 12/16 showing esophagitis, gastric stensosis, stent placed, impaired peristalsis. Pharmacy was consulted to dose TPN on 12/20 for intolerance to enteral feeding.    Glucose / Insulin : No hx DM, no PTA meds. A1c 5.7% on 12/14 -BG goal <180. Range: 119-133 (8 units SSI/24 hrs) -Octreotide  infusion resumed 12/25 at 50 mcg/hr (noted that octreotide , especially at higher doses/frequencies, can cause glucose dysregulation) Electrolytes: WNL, including CorrCa (9) Renal: SCr < 1, BUN 26 (elevated but stable) Hepatic: WNL, alk phos minimally elevated (trending down) -Trig trending down, WNL at 99 -Alb low at 2.3, but overall stable Intake / Output:  -UOP mostly undocumented, 400 ml documented this am -Continues to have emesis ~ 1000 ml/24 hrs -LBM 1/4 GI Imaging: -12/18 Abd xray: No bowel dilatation or evidence of obstruction. Retained gastric fluid. 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 Prosthesis placed. Duodenal dilation deformity  -12/25 DG abd: nonobstructive bowel gas pattern -12/27 DG abd: nonobstructive bowel gas pattern -12/28 chest/abd CT: Esophageal distention. Nonspecific Enteritis. New mildly enlarged right hilar lymph node and a few slightly prominent bilateral hilar lymph nodes. New small volume of ascites  GI Surgeries / Procedures:  -12/16 EGD: esophagitis  GI meds:   -Protonix  40 mg IV q12h  -Octreotide  infusion at 50 mcg/hr   Central access: Implanted Port (01/22/24) TPN start date: 12/20    Nutritional Goals: Goal TPN rate is 90 mL/hr and provides 118 g of protein and 2298 kcals per day  RD Assessment: Estimated Needs Total Energy Estimated Needs: 2100-2450 kcals Total Protein Estimated Needs: 105-120 grams Total Fluid Estimated Needs: >/= 2.1L  Current Nutrition:  NPO except for ice chips, sips with meds or for comfort TPN  TPN transitioned back to continuous infusion on 1/1. Once pt is otherwise medically stable and no longer requiring octreotide  infusion, can transition back to cyclic TPN. This can be done in the outpatient setting if necessary. MD aware.  Plan:  Continue TPN at goal rate of 90 mL/hr Electrolytes in TPN: Na 100 mEq/L K 60 mEq/L Ca 2 mEq/L Mg 5 mEq/L Phos 20 mmol/L Cl:Ac  to 1:1 Add standard MVI and trace elements to TPN Continue mSSI q6h Monitor TPN labs on Mon/Thurs   Stefano MARLA Bologna, PharmD, BCPS Clinical Pharmacist 12/21/2024 9:36 AM

## 2024-12-22 ENCOUNTER — Other Ambulatory Visit: Payer: Self-pay

## 2024-12-22 ENCOUNTER — Encounter: Payer: Self-pay | Admitting: *Deleted

## 2024-12-22 ENCOUNTER — Ambulatory Visit: Payer: Self-pay

## 2024-12-22 DIAGNOSIS — K920 Hematemesis: Secondary | ICD-10-CM | POA: Diagnosis not present

## 2024-12-22 LAB — RAD ONC ARIA SESSION SUMMARY
Course Elapsed Days: 5
Plan Fractions Treated to Date: 3
Plan Prescribed Dose Per Fraction: 3 Gy
Plan Total Fractions Prescribed: 9
Plan Total Prescribed Dose: 27 Gy
Reference Point Dosage Given to Date: 9 Gy
Reference Point Session Dosage Given: 3 Gy
Session Number: 4

## 2024-12-22 LAB — CBC WITH DIFFERENTIAL/PLATELET
Abs Immature Granulocytes: 0.17 K/uL — ABNORMAL HIGH (ref 0.00–0.07)
Basophils Absolute: 0 K/uL (ref 0.0–0.1)
Basophils Relative: 1 %
Eosinophils Absolute: 0 K/uL (ref 0.0–0.5)
Eosinophils Relative: 1 %
HCT: 25.5 % — ABNORMAL LOW (ref 39.0–52.0)
Hemoglobin: 8.3 g/dL — ABNORMAL LOW (ref 13.0–17.0)
Immature Granulocytes: 5 %
Lymphocytes Relative: 15 %
Lymphs Abs: 0.5 K/uL — ABNORMAL LOW (ref 0.7–4.0)
MCH: 29.7 pg (ref 26.0–34.0)
MCHC: 32.5 g/dL (ref 30.0–36.0)
MCV: 91.4 fL (ref 80.0–100.0)
Monocytes Absolute: 0.3 K/uL (ref 0.1–1.0)
Monocytes Relative: 7 %
Neutro Abs: 2.4 K/uL (ref 1.7–7.7)
Neutrophils Relative %: 71 %
Platelets: 291 K/uL (ref 150–400)
RBC: 2.79 MIL/uL — ABNORMAL LOW (ref 4.22–5.81)
RDW: 15.6 % — ABNORMAL HIGH (ref 11.5–15.5)
WBC: 3.4 K/uL — ABNORMAL LOW (ref 4.0–10.5)
nRBC: 0.6 % — ABNORMAL HIGH (ref 0.0–0.2)

## 2024-12-22 LAB — RENAL FUNCTION PANEL
Albumin: 2.5 g/dL — ABNORMAL LOW (ref 3.5–5.0)
Anion gap: 8 (ref 5–15)
BUN: 24 mg/dL — ABNORMAL HIGH (ref 6–20)
CO2: 26 mmol/L (ref 22–32)
Calcium: 8.1 mg/dL — ABNORMAL LOW (ref 8.9–10.3)
Chloride: 103 mmol/L (ref 98–111)
Creatinine, Ser: 0.56 mg/dL — ABNORMAL LOW (ref 0.61–1.24)
GFR, Estimated: >60 mL/min
Glucose, Bld: 121 mg/dL — ABNORMAL HIGH (ref 70–99)
Phosphorus: 3.1 mg/dL (ref 2.5–4.6)
Potassium: 4.4 mmol/L (ref 3.5–5.1)
Sodium: 137 mmol/L (ref 135–145)

## 2024-12-22 LAB — GLUCOSE, CAPILLARY
Glucose-Capillary: 121 mg/dL — ABNORMAL HIGH (ref 70–99)
Glucose-Capillary: 140 mg/dL — ABNORMAL HIGH (ref 70–99)
Glucose-Capillary: 151 mg/dL — ABNORMAL HIGH (ref 70–99)
Glucose-Capillary: 153 mg/dL — ABNORMAL HIGH (ref 70–99)
Glucose-Capillary: 180 mg/dL — ABNORMAL HIGH (ref 70–99)

## 2024-12-22 MED ORDER — PHENYLEPHRINE HCL-NACL 20-0.9 MG/250ML-% IV SOLN
INTRAVENOUS | Status: AC
  Filled 2024-12-22: qty 250

## 2024-12-22 MED ORDER — SODIUM CHLORIDE 0.9 % IV SOLN
35.0000 mg/m2 | Freq: Once | INTRAVENOUS | Status: AC
  Administered 2024-12-22: 66 mg via INTRAVENOUS
  Filled 2024-12-22: qty 66

## 2024-12-22 MED ORDER — SODIUM CHLORIDE 0.9 % IV SOLN
150.0000 mg | Freq: Once | INTRAVENOUS | Status: AC
  Administered 2024-12-22: 150 mg via INTRAVENOUS
  Filled 2024-12-22: qty 5

## 2024-12-22 MED ORDER — PALONOSETRON HCL INJECTION 0.25 MG/5ML
0.2500 mg | Freq: Once | INTRAVENOUS | Status: AC
  Administered 2024-12-22: 0.25 mg via INTRAVENOUS
  Filled 2024-12-22: qty 5

## 2024-12-22 MED ORDER — POTASSIUM CHLORIDE IN NACL 20-0.9 MEQ/L-% IV SOLN
Freq: Once | INTRAVENOUS | Status: AC
  Filled 2024-12-22: qty 1000

## 2024-12-22 MED ORDER — DEXAMETHASONE SOD PHOSPHATE PF 10 MG/ML IJ SOLN
10.0000 mg | Freq: Once | INTRAMUSCULAR | Status: AC
  Administered 2024-12-22: 10 mg via INTRAVENOUS
  Filled 2024-12-22: qty 1

## 2024-12-22 MED ORDER — MAGNESIUM SULFATE 2 GM/50ML IV SOLN
2.0000 g | Freq: Once | INTRAVENOUS | Status: AC
  Administered 2024-12-22: 2 g via INTRAVENOUS
  Filled 2024-12-22: qty 50

## 2024-12-22 MED ORDER — TRAVASOL 10 % IV SOLN
INTRAVENOUS | Status: AC
  Filled 2024-12-22: qty 1188

## 2024-12-22 NOTE — Progress Notes (Signed)
 Faxed notification from [facility] that specimen has been received for testing.

## 2024-12-22 NOTE — Progress Notes (Signed)
 He is still having some bouts of hematemesis.  He had they wanted to episode yesterday.  He is getting radiotherapy.  Today, he will get low-dose cisplatin  him.  He has been getting this weekly.  His hemoglobin at disease trending upward a little bit.  His white cell count is 3.4.  Hemoglobin 8.3.  Platelet count is 291,000.  His BUN is 24 creatinine 0.56.  He did walk 3 times around the unit yesterday.  This is certainly encouraging.  He does not complain of any pain.  He has TPN going.  This is probably on him some nutrients.  It would be nice if he could eat at some point.  He has a little bit of a headache.  He gets this on occasion.  He has had no fever.  He has had no mouth sores.  He has a little bit of a cough.  He has not noted any abdominal spasms.  On his vital signs, temperature is 97.9.  Pulse 78.  Blood pressure 113/71.  Head and neck exam shows no ocular or oral lesions.  There is no scleral icterus.  His oral mucosa is relatively moist.  There is no adenopathy in the neck.  Lungs are clear bilaterally.  He has good air movement bilaterally.  I hear no wheezing.  Cardiac exam is regular rate and rhythm.  There may be an occasional extra beat.  He has no murmurs, rubs or bruits.  Abdomen is somewhat soft.  Bowel sounds are present.  He has no guarding or rebound tenderness.  He has no fluid wave.  There is no palpable liver or spleen tip.  Extremity shows no clubbing, cyanosis or edema.  Neurological exam shows no focal neurological deficits.  Again, he is getting radiotherapy.  He will get chemotherapy with cisplatin  today.  Hopefully, we will see*to see the hematemesis resolved.  We had a very nice prayer this morning.  He wanted me to pray with him.  I definitely did this.  He has a strong faith.  I know that his faith will pay off.  I do appreciate the great care he is getting from everybody up on 4 W.  Jeralyn Crease, MD  Inge 17:14

## 2024-12-22 NOTE — Hospital Course (Addendum)
 Henry May is a 59 y.o. male with PMH of presented [facility] due to nausea, vomiting and coffee-ground emesis 2 days after he started new chemotherapy, and admitted for intractable nausea and vomiting, dilated small bowel loops and coffee-ground emesis.Recent EGD on 12/4 with LA grade D esophagitis without bleeding, and large infiltrative, sessile and ulcerated circumferential mass with contact of bleeding in gastric body and antrum with antral narrowing and luminal diameter of approximately 1.5 cm   In ED,  vss stable.CBC and CMP without significant finding.  Lipase 171. CT abdomen and pelvis>>dilated proximal small bowel suggesting SBO but without discrete transition point, thick walled gastric body/antrum without definite focal mass, mild nodularity along the pylorus, scarring with superimposed patchy peribronchovascular nodularity in the bilateral lower lobes right more than left suggesting mild aspiration or pneumonia.   GI, surgery oncology consulted EGD>> s/p gastric stent placement on 12/16.Was unable to tolerate diet.  Started on TPN.Plan was for discharge with TPN once logistic in place but nausea and vomiting gotten worse after stopping octreotide  and Decadron  and having hemoptysis with drop in hemoglobin and restarted on octreotide  infusion.   CT chest, abdomen and pelvis : showed new patchy substernal pneumomediastinum of indeterminate source, patchy consolidation in lower lobes and posterior LUL, esophageal distention with retained/reflux fluid in thoracic inlet, emphysematous cystitis and circumferential gastric thickening with adjacent stranding change, antropyloric stenting and continued duodenal dilation, thickening jejunal folds.  Overall with ongoing bleeding felt secondary to metastatic gastric carcinoma Oncology radiation following and on chemotherapy along with XRT inhouse-to decrease tumor burden and provide symptomatic relief.  Subjective: Seen and examined Tried some  broth about vomiting of biliary emesis this morning but  no hematemesis it seems Overnight afebrile BP stable, labs this morning showed fairly stable hemoglobin 8.8 mild leukopenia  Assessment and plan:  Gastric cancer-f/b Dr. 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 Now on weekly Cisplatin  ( last dose 1/7), on  daily XRT for  x 10 XRT (1/2, 1/5>>) to decrease tumor burden, obstruction and bleeding-family informs-oncology will be holding chemo this week-Patient having ongoing intermittent hematemesis. Octreotide  infusion restarted 12/26>> now on octreotide  twice daily per oncology. Cont TPN Patient completed Unasyn  1/2, s/p multiple PRBC  again had 2 more units PRBC 1/8-repeat H&H improved- trend. Ferric gluconate 250 mg x 4 doses  ordered 1/6>> Continue SSI, PPI twice daily, Carafate  1 g tid, Antiemetics, folic acid .Palliative following As per oncology--follow-up results of NGS testing on the gastric mass to look for additional treatment options   x-ray abd 1/8-nonobstructive and no more nausea-advance diet as tolerated   Oncology continues to follow along. Recent Labs  Lab 12/23/24 2232 12/24/24 0424 12/25/24 0419 12/27/24 0341 12/28/24 0409  HGB 10.0* 10.3* 9.1* 9.0* 8.8*  HCT 30.0* 30.1* 28.2* 28.4* 27.9*    Aspiration pneumonitis/pneumonia Pneumomediastinum noted on the CT chest: Pneumomediastinum likely from retching and vomiting.Patient completed Unasyn  1/2 cont aspiration precaution.  Leukopenia: Stable monitor closely while on chemo.   Diabetes-diet controlled Hemoglobin A1c 5.7% (11/28/2024). Cbg ok on ssi while on TPN.   History of DVT: Status post IVC filter.     Elevated lipase: Improved.   Insomnia mainly due to intractable  nausea and vomiting: Cont IV Ativan  and Phenergan /antiemetics.  Having biliary vomiting today.  Lightheadedness/presyncope Deconditioning: Likely secondary to intractable nausea  vomiting hematemesis and volume depletion.resolved. ambulating.  Severe malnutrition On TPN as above. Body mass index is 21.56 kg/m.   Mobility: PT Orders:  PT Follow up Rec: No Pt Follow Up1/05/2025 1434   DVT prophylaxis: Place and maintain sequential compression device Start: 12/15/24 1907 SCDs Start: 11/28/24 1042 no chemical prophylaxis due to hematemesis/GI bleeding Code Status:   Code Status: Limited: Do not attempt resuscitation (DNR) -DNR-LIMITED -Do Not Intubate/DNI  Family Communication: plan of care discussed with patient and family at bedside. Patient status is: Remains hospitalized because of severity of illness Level of care: Progressive   Dispo: The patient is from: home            Anticipated disposition: Disposition as per oncology after completion of chemo and radiation Objective: Vitals last 24 hrs: Vitals:   12/27/24 1440 12/27/24 2020 12/28/24 0500 12/28/24 0518  BP: 130/86 128/79  (!) 130/90  Pulse: 90 90  95  Resp: 20 17  14   Temp: 98 F (36.7 C) 97.7 F (36.5 C)  98.1 F (36.7 C)  TempSrc: Oral Oral  Oral  SpO2: 100% 100%  99%  Weight:   72.1 kg   Height:       Physical Examination: General exam: AAOX3 HEENT:Oral mucosa moist, Ear/Nose WNL grossly Respiratory system: Bilaterally clear  Cardiovascular system: S1 & S2 +, No JVD. Gastrointestinal system: Abdomen soft no significant tenderness BS+ Nervous System: Alert, awake, non focal Extremities: extremities warm, leg edema mild Skin: Warm, no rashes MSK: thin muscle bulk,tone, power.   Medications reviewed:  Scheduled Meds:  Chlorhexidine  Gluconate Cloth  6 each Topical Daily   insulin  aspart  0-15 Units Subcutaneous Q6H   octreotide   100 mcg Subcutaneous BID   mouth rinse  15 mL Mouth Rinse 4 times per day    pantoprazole  (PROTONIX ) IV  80 mg Intravenous Q12H   sucralfate   1 g Oral TID   Continuous Infusions:  promethazine  (PHENERGAN ) injection (IM or IVPB) 25 mg (12/28/24 1047)   TPN ADULT (ION)     Diet: Diet Order             Diet full liquid Room service appropriate? Yes; Fluid consistency: Thin  Diet effective now

## 2024-12-22 NOTE — Progress Notes (Signed)
 " PROGRESS NOTE Henry May  FMW:[id number] DOB: 05/11/1966 DOA: 11/27/2024 PCP: [name], DO  Brief Narrative/Hospital Course: Henry May is a 59 y.o. male with PMH of presented to [facility] due to nausea, vomiting and coffee-ground emesis 2 days after he started new chemotherapy, and admitted for intractable nausea and vomiting, dilated small bowel loops and coffee-ground emesis.Recent EGD on 12/4 with LA grade D esophagitis without bleeding, and large infiltrative, sessile and ulcerated circumferential mass with contact of bleeding in gastric body and antrum with antral narrowing and luminal diameter of approximately 1.5 cm   In ED, stable vitals.  CBC and CMP without significant finding.  Lipase 171. CT abdomen and pelvis>>dilated proximal small bowel suggesting SBO but without discrete transition point, thick walled gastric body/antrum without definite focal mass, mild nodularity along the pylorus, scarring with superimposed patchy peribronchovascular nodularity in the bilateral lower lobes right more than left suggesting mild aspiration or pneumonia.   GI, surgery oncology consulted EGD>> s/p gastric stent placement on 12/16.Was unable to tolerate diet.  Started on TPN.Plan was for discharge with TPN once logistic in place but nausea and vomiting gotten worse after stopping octreotide  and Decadron  and having hemoptysis with drop in hemoglobin and restarted on octreotide  infusion.   GI reengaged >CT chest, abdomen and pelvis : showed new patchy substernal pneumomediastinum of indeterminate source, patchy consolidation in lower lobes and posterior LUL, esophageal distention with retained/reflux fluid in thoracic inlet, emphysematous cystitis and circumferential gastric thickening with adjacent stranding change, antropyloric stenting and continued duodenal dilation, thickening jejunal folds.  Overall with ongoing bleeding felt secondary to metastatic gastric carcinoma Oncology  radiation following and on chemotherapy along with XRT inhouse-to decrease tumor burden and provide symptomatic relief.  Subjective: Seen and examined today Wife at the bedside.  Patient appears ill frail no specific complaints Yesterday had vomiting but none today. Continues to have small bouts of vomiting with blood, remains on TPN-along with radiotherapy chemotherapy and oncology following Overnight afebrile, VSS, Labs-stable BMP CBC with leukopenia anemia   Assessment and plan:  Gastric cancer-f/b Dr. 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 Currently undergoing chemotherapy with cisplatin  and radiotherapy x 10 doses (1/2, 1/5>>) to decrease tumor burden  Back on octreotide  infusion from 12/26, continue TPN-pharmacy managing, continue SSI, PPI twice daily, Carafate  1 g tid, Antiemetics, folic acid . Patient completed Unasyn  1/2, s/p multiple PRBC transfusion. Continue aspiration precaution, supportive care, transfusion to keep hemoglobin above 8.  Getting ferric gluconate 250 mg x 4 doses with Decadron  per hemonc 1/6>> Palliative following and underwent family meeting.  Aspiration pneumonitis/pneumonia Pneumomediastinum noted on the CT chest: Patient completed Unasyn  1/2, patient precaution.  Keep on the aspiration precaution.  Pneumomediastinum likely from retching and vomiting.  Monitor.  Leukopenia: Monitor while on chemo.    Diabetes-diet control Hemoglobin A1c 5.7% (11/28/2024). Cbg ok on ssi while on TPN   History of DVT Status post IVC filter.     Elevated lipase Improved.   Insomnia mainly due to intractable nausea and vomiting Cont IV Ativan  and Phenergan  as  above.  Lightheadedness/presyncope Deconditioning: Likely secondary to intractable nausea vomiting hematemesis and volume depletion.  Severe malnutrition On TPN as above.  Mobility: PT Orders:  PT Follow up Rec: No Pt Follow Up1/05/2025 1434   DVT prophylaxis: Place and maintain sequential compression  device Start: 12/15/24 1907 SCDs Start: 11/28/24 1042 no chemical prophylaxis due to hematemesis/GI bleeding Code Status:   Code Status: Limited: Do not attempt resuscitation (DNR) -DNR-LIMITED -Do Not Intubate/DNI  Family Communication: plan of care discussed with patient at bedside. Patient status is: Remains hospitalized because of severity of illness Level of care: Progressive   Dispo: The patient is from: home            Anticipated disposition: TBD Objective: Vitals last 24 hrs: Vitals:   12/21/24 0536 12/21/24 1326 12/21/24 2129 12/22/24 0510  BP:  111/75 116/66 113/71  Pulse:  72 71 78  Resp:  20  18  Temp:  98.9 F (37.2 C) 98.9 F (37.2 C) 97.9 F (36.6 C)  TempSrc:  Oral Oral Oral  SpO2:  97% 97% 99%  Weight: 74.4 kg   74.6 kg  Height:        Physical Examination: General exam: alert awake, oriented, older than stated age HEENT:Oral mucosa moist, Ear/Nose WNL grossly Respiratory system: Bilaterally clear BS,no use of accessory muscle Cardiovascular system: S1 & S2 +, No JVD. Gastrointestinal system: Abdomen soft,NT,ND, BS+ Nervous System: Alert, awake, moving all extremities,and following commands. Extremities: extremities warm, leg edema mild Skin: Warm, no rashes MSK: Normal muscle bulk,tone, power   Medications reviewed:  Scheduled Meds:  Chlorhexidine  Gluconate Cloth  6 each Topical Daily   insulin  aspart  0-15 Units Subcutaneous Q6H   mouth rinse  15 mL Mouth Rinse 4 times per day   pantoprazole  (PROTONIX ) IV  40 mg Intravenous Q12H   phenylephrine        sucralfate   1 g Oral TID   Continuous Infusions:  ferric gluconate (FERRLECIT) IVPB 250 mg  (12/21/24 0903)   octreotide  (SANDOSTATIN ) 500 mcg in sodium chloride  0.9 % 250 mL (2 mcg/mL) infusion 50 mcg/hr (12/22/24 0547)   promethazine  (PHENERGAN ) injection (IM or IVPB) Stopped (12/22/24 0554)   TPN ADULT (ION) 90 mL/hr at 12/21/24 1756   TPN ADULT (ION)     Diet: Diet Order             Diet NPO time specified Except for: Ice Chips, Sips with Meds, Other (See Comments)  Diet effective now                  Data Reviewed: I have personally reviewed following labs and imaging studies ( see epic result tab) CBC: Recent Labs  Lab 12/16/24 0423 12/16/24 1750 12/18/24 0513 12/18/24 2034 12/19/24 0446 12/19/24 1624 12/20/24 0351 12/21/24 0416 12/22/24 0409  WBC 3.7*   < > 3.7*  --  2.5*  --  3.6* 2.6* 3.4*  NEUTROABS 3.0  --   --   --   --   --   --  1.9 2.4  HGB 6.3*   < > 7.1*   < > 9.7* 8.7* 7.9* 8.1* 8.3*  HCT 19.1*   < > 21.9*   < > 28.8* 25.6* 24.0* 24.3* 25.5*  MCV 93.2   < > 91.3  --  87.3  --  88.9 89.7 91.4  PLT 210   < > 239  --  256  --  229 245 291   < > = values in this interval not displayed.   CMP: Recent Labs  Lab 12/16/24 0423 12/17/24 0341 12/18/24 0513 12/19/24 0446 12/20/24 0351 12/21/24 0416 12/22/24 0409  NA 135 137 135 135 138 138 137  K 4.8 4.0 4.2 3.8 3.8 4.1 4.4  CL 104 103  101 101 104 105 103  CO2 23 25 24 25 26 27 26   GLUCOSE 223* 131* 144* 122* 125* 134* 121*  BUN 28* 31* 29* 29* 29* 26* 24*  CREATININE 0.57* 0.66 0.59* 0.64 0.59* 0.52* 0.56*  CALCIUM  7.8* 7.9* 7.4* 7.8* 7.8* 7.7* 8.1*  MG 2.4 2.1 1.9  --  2.1  --   --   PHOS 2.6 2.9 2.6  --  2.5  --  3.1   GFR: Estimated Creatinine Clearance: 103.6 mL/min (A) (by C-G formula based on SCr of 0.56 mg/dL (L)). Recent Labs  Lab 12/16/24 0423 12/20/24 0351 12/22/24 0409  AST 29 17  --   ALT 56* 34  --   ALKPHOS 133* 134*  --   BILITOT 0.4 0.7  --   PROT 5.1* 5.2*  --   ALBUMIN  2.3* 2.3* 2.5*   No results for input(s): LIPASE, AMYLASE in the last 168 hours. No  results for input(s): AMMONIA in the last 168 hours. Coagulation Profile: No results for input(s): INR, PROTIME in the last 168 hours. Unresulted Labs (From admission, onward)     Start     Ordered   12/21/24 0500  CBC with Differential/Platelet  Daily,   R     Question:  Specimen collection method  Answer:  Lab=Lab collect   12/20/24 0651   12/16/24 0500  Phosphorus  (Standard TPN Labs (all labs to be drawn at 0500))  Every Mon,Thu (0500),   R     Question:  Specimen collection method  Answer:  IV Team=IV Team collect   12/15/24 0741   12/06/24 0500  Comprehensive metabolic panel  (TPN Lab Panel)  Every Mon,Thu (0500),   R     Question:  Specimen collection method  Answer:  IV Team=IV Team collect   12/04/24 1026   12/06/24 0500  Magnesium   (TPN Lab Panel)  Every Mon,Thu (0500),   R     Question:  Specimen collection method  Answer:  IV Team=IV Team collect   12/04/24 1026   12/06/24 0500  Triglycerides  (TPN Lab Panel)  Every Monday (0500),   R     Question:  Specimen collection method  Answer:  IV Team=IV Team collect   12/04/24 1026           Antimicrobials/Microbiology: Anti-infectives (From admission, onward)    Start     Dose/Rate Route Frequency Ordered Stop   12/11/24 0900  Ampicillin -Sulbactam (UNASYN ) 3 g in sodium chloride  0.9 % 100 mL IVPB        3 g 200 mL/hr over 30 Minutes Intravenous Every 6 hours 12/11/24 0817 12/18/24 0950   11/28/24 1200  Ampicillin -Sulbactam (UNASYN ) 3 g in sodium chloride  0.9 % 100 mL IVPB  Status:  Discontinued        3 g 200 mL/hr over 30 Minutes Intravenous Every 6 hours 11/28/24 1048 12/05/24 0909   11/28/24 0045  Ampicillin -Sulbactam (UNASYN ) 3 g in sodium chloride  0.9 % 100 mL IVPB        3 g 200 mL/hr over 30 Minutes Intravenous  Once 11/28/24 0042 11/28/24 0134         Component Value Date/Time   SDES  02/05/2024 1904    BLOOD BLOOD RIGHT ARM Performed at [facility], [address]  [place], [place] [address]    [facility]  02/05/2024 1904    Blood Culture adequate volume BOTTLES DRAWN AEROBIC AND ANAEROBIC Performed at [facility], [address]  [place], [place] [address]    CULT  02/05/2024 1904    NO GROWTH 5 DAYS Performed at [facility], [address] [place], [place] [address]    REPTSTATUS 02/10/2024 FINAL 02/05/2024 1904    Procedures: Procedures (LRB): EGD (ESOPHAGOGASTRODUODENOSCOPY) (N/A) INSERTION, STENT, DUODENUM (N/A)   Mennie LAMY, MD [facility] 12/22/2024, 1:01 PM   "

## 2024-12-22 NOTE — Progress Notes (Signed)
 Chemo RN presented to patient room. Patient verified using two separate identifiers. Consent for administration of cisplatin  previously obtained this admission. Pre-hydration and pre-medications administered. Patient tolerated cisplatin  infusion well, without any complications. Post-hydration started, will be taken down by primary RN.

## 2024-12-22 NOTE — Progress Notes (Signed)
 PHARMACY - TOTAL PARENTERAL NUTRITION CONSULT NOTE   Indication: intolerance to enteral feeds  Patient Measurements: Height: 6' (182.9 cm) Weight: 74.6 kg (164 lb 7.4 oz) IBW/kg (Calculated) : 77.6 TPN AdjBW (KG): 70 Body mass index is 22.31 kg/m.  Assessment: 67 yoM admitted on 12/13 with intractable nausea and vomiting, dilated small bowel loops, and coffee-ground emesis. 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  He underwent EGD 12/16 showing esophagitis, gastric stensosis, stent placed, impaired peristalsis. Pharmacy was consulted to dose TPN on 12/20 for intolerance to enteral feeding.    Glucose / Insulin : No hx DM, no PTA meds. A1c 5.7% on 12/14 -BG goal <180. Range: 122-140 (10 units SSI/24 hrs) -Dexamethasone  10 mg 1/7 with chemo -Octreotide  infusion resumed 12/25 at 50 mcg/hr (noted that octreotide , especially at higher doses/frequencies, can cause glucose dysregulation) Electrolytes: WNL, including CorrCa (9.3) Renal: SCr < 1, BUN 24 (elevated but stable) Hepatic: WNL, alk phos minimally elevated (trending down) -Trig trending down, WNL at 99 -Alb low at 2.5, but overall stable Intake / Output:  -UOP mostly undocumented, 275 ml documented this am -Continues to have emesis ~ 1000 ml/24 hrs (700 ml yesterday) -LBM 1/4 GI Imaging: -12/18 Abd xray: No bowel dilatation or evidence of obstruction. Retained gastric fluid. 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 Prosthesis placed. Duodenal dilation deformity  -12/25 DG abd: nonobstructive bowel gas pattern -12/27 DG abd: nonobstructive bowel gas pattern -12/28 chest/abd CT: Esophageal distention. Nonspecific Enteritis. New mildly enlarged right hilar lymph node and a few slightly prominent bilateral hilar lymph nodes. New small volume of ascites  GI Surgeries / Procedures:  -12/16 EGD: esophagitis  GI meds:   -Protonix  40 mg IV q12h  -Octreotide  infusion at 50 mcg/hr    Central access: Implanted Port (01/22/24) TPN start date: 12/20   Nutritional Goals: Goal TPN rate is 90 mL/hr and provides 118 g of protein and 2298 kcals per day  RD Assessment: Estimated Needs Total Energy Estimated Needs: 2100-2450 kcals Total Protein Estimated Needs: 105-120 grams Total Fluid Estimated Needs: >/= 2.1L  Current Nutrition:  NPO except for ice chips, sips with meds or for comfort TPN  TPN transitioned back to continuous infusion on 1/1. Once pt is otherwise medically stable and no longer requiring octreotide  infusion, can transition back to cyclic TPN. This can be done in the outpatient setting if necessary. MD aware.  Plan:  Continue TPN at goal rate of 90 mL/hr Patient getting chemo today - will receive an additional 20 mEq K and 2 g Mg Electrolytes in TPN: Na 100 mEq/L K 45 mEq/L Ca 2 mEq/L Mg 5 mEq/L Phos 20 mmol/L Cl:Ac  to 1:1 Add standard MVI and trace elements to TPN Continue mSSI q6h Monitor TPN labs on Mon/Thurs   Stefano MARLA Bologna, PharmD, BCPS Clinical Pharmacist 12/22/2024 11:35 AM

## 2024-12-22 NOTE — TOC Progression Note (Addendum)
 Transition of Care [facility]) - Progression Note   Patient Details  Name: Henry May MRN: [id number] Date of Birth: 01/07/1966  [facility]) CM/SW Contact  Duwaine GORMAN Aran, LCSW Phone Number: 12/22/2024, 10:57 AM  Clinical Narrative: Patient remains on TPN and is NPO. Patient continues to receive chemotherapy and radiation. Care management to follow for possible discharge needs.  Addendum: CSW notified by Holley with [employer] that patient's insurance changed on 12/16/24, so an authorization is not needed for TPN but patient will have a high copay.  Barriers to Discharge: Continued Medical Work up  Social Drivers of Health (SDOH) Interventions SDOH Screenings   Food Insecurity: Patient Declined (11/28/2024)  Housing: Unknown (11/28/2024)  Transportation Needs: Patient Declined (11/28/2024)  Utilities: Patient Declined (11/28/2024)  Depression (PHQ2-9): Medium Risk (11/26/2024)  Social Connections: Unknown (04/30/2022)   Received from [facility]  Tobacco Use: Medium Risk (11/30/2024)   Readmission Risk Interventions    12/06/2024   10:13 AM 02/07/2024    8:40 AM  Readmission Risk Prevention Plan  Transportation Screening Complete Complete  HRI or Home Care Consult Complete Complete  Social Work Consult for Recovery Care Planning/Counseling Complete Complete  Palliative Care Screening Not Applicable Not Applicable  Medication Review Oceanographer) Complete Complete

## 2024-12-23 ENCOUNTER — Inpatient Hospital Stay (HOSPITAL_COMMUNITY)

## 2024-12-23 ENCOUNTER — Ambulatory Visit: Payer: Self-pay

## 2024-12-23 ENCOUNTER — Other Ambulatory Visit: Payer: Self-pay

## 2024-12-23 DIAGNOSIS — K920 Hematemesis: Secondary | ICD-10-CM | POA: Diagnosis not present

## 2024-12-23 LAB — CBC WITH DIFFERENTIAL/PLATELET
Abs Immature Granulocytes: 0.2 K/uL — ABNORMAL HIGH (ref 0.00–0.07)
Basophils Absolute: 0 K/uL (ref 0.0–0.1)
Basophils Relative: 1 %
Eosinophils Absolute: 0 K/uL (ref 0.0–0.5)
Eosinophils Relative: 0 %
HCT: 23 % — ABNORMAL LOW (ref 39.0–52.0)
Hemoglobin: 7.3 g/dL — ABNORMAL LOW (ref 13.0–17.0)
Immature Granulocytes: 6 %
Lymphocytes Relative: 15 %
Lymphs Abs: 0.5 K/uL — ABNORMAL LOW (ref 0.7–4.0)
MCH: 29.2 pg (ref 26.0–34.0)
MCHC: 31.7 g/dL (ref 30.0–36.0)
MCV: 92 fL (ref 80.0–100.0)
Monocytes Absolute: 0.2 K/uL (ref 0.1–1.0)
Monocytes Relative: 7 %
Neutro Abs: 2.2 K/uL (ref 1.7–7.7)
Neutrophils Relative %: 71 %
Platelets: 284 K/uL (ref 150–400)
RBC: 2.5 MIL/uL — ABNORMAL LOW (ref 4.22–5.81)
RDW: 15.3 % (ref 11.5–15.5)
Smear Review: NORMAL
WBC: 3.1 K/uL — ABNORMAL LOW (ref 4.0–10.5)
nRBC: 0.6 % — ABNORMAL HIGH (ref 0.0–0.2)

## 2024-12-23 LAB — RAD ONC ARIA SESSION SUMMARY
Course Elapsed Days: 6
Plan Fractions Treated to Date: 4
Plan Prescribed Dose Per Fraction: 3 Gy
Plan Total Fractions Prescribed: 9
Plan Total Prescribed Dose: 27 Gy
Reference Point Dosage Given to Date: 12 Gy
Reference Point Session Dosage Given: 3 Gy
Session Number: 5

## 2024-12-23 LAB — COMPREHENSIVE METABOLIC PANEL WITH GFR
ALT: 32 U/L (ref 0–44)
AST: 17 U/L (ref 15–41)
Albumin: 2.6 g/dL — ABNORMAL LOW (ref 3.5–5.0)
Alkaline Phosphatase: 145 U/L — ABNORMAL HIGH (ref 38–126)
Anion gap: 8 (ref 5–15)
BUN: 21 mg/dL — ABNORMAL HIGH (ref 6–20)
CO2: 24 mmol/L (ref 22–32)
Calcium: 8.1 mg/dL — ABNORMAL LOW (ref 8.9–10.3)
Chloride: 104 mmol/L (ref 98–111)
Creatinine, Ser: 0.5 mg/dL — ABNORMAL LOW (ref 0.61–1.24)
GFR, Estimated: >60 mL/min
Glucose, Bld: 139 mg/dL — ABNORMAL HIGH (ref 70–99)
Potassium: 4.5 mmol/L (ref 3.5–5.1)
Sodium: 136 mmol/L (ref 135–145)
Total Bilirubin: 0.4 mg/dL (ref 0.0–1.2)
Total Protein: 5.7 g/dL — ABNORMAL LOW (ref 6.5–8.1)

## 2024-12-23 LAB — GLUCOSE, CAPILLARY
Glucose-Capillary: 135 mg/dL — ABNORMAL HIGH (ref 70–99)
Glucose-Capillary: 124 mg/dL — ABNORMAL HIGH (ref 70–99)
Glucose-Capillary: 124 mg/dL — ABNORMAL HIGH (ref 70–99)
Glucose-Capillary: 127 mg/dL — ABNORMAL HIGH (ref 70–99)

## 2024-12-23 LAB — PHOSPHORUS: Phosphorus: 2.9 mg/dL (ref 2.5–4.6)

## 2024-12-23 LAB — HEMOGLOBIN AND HEMATOCRIT, BLOOD
HCT: 30 % — ABNORMAL LOW (ref 39.0–52.0)
Hemoglobin: 10 g/dL — ABNORMAL LOW (ref 13.0–17.0)

## 2024-12-23 LAB — MAGNESIUM: Magnesium: 2.5 mg/dL — ABNORMAL HIGH (ref 1.7–2.4)

## 2024-12-23 LAB — PREPARE RBC (CROSSMATCH)

## 2024-12-23 MED ORDER — SODIUM CHLORIDE 0.9% IV SOLUTION: Freq: Once | INTRAVENOUS | Status: AC

## 2024-12-23 MED ORDER — METOCLOPRAMIDE HCL 5 MG/ML IJ SOLN: 10.0000 mg | Freq: Once | INTRAMUSCULAR | Status: DC

## 2024-12-23 MED ORDER — PANTOPRAZOLE SODIUM 40 MG IV SOLR
80.0000 mg | Freq: Two times a day (BID) | INTRAVENOUS | Status: DC
  Administered 2024-12-23 – 2024-12-31 (×17): 80 mg via INTRAVENOUS
  Filled 2024-12-23 (×18): qty 20

## 2024-12-23 MED ORDER — FUROSEMIDE 10 MG/ML IJ SOLN
20.0000 mg | Freq: Once | INTRAMUSCULAR | Status: AC
  Administered 2024-12-23: 20 mg via INTRAVENOUS
  Filled 2024-12-23: qty 2

## 2024-12-23 MED ORDER — SODIUM CHLORIDE 0.9 % IV SOLN
8.0000 mg | Freq: Three times a day (TID) | INTRAVENOUS | Status: DC
  Administered 2024-12-23: 8 mg via INTRAVENOUS
  Filled 2024-12-23 (×3): qty 4

## 2024-12-23 MED ORDER — ONDANSETRON 8 MG/NS 50 ML IVPB
8.0000 mg | Freq: Three times a day (TID) | INTRAVENOUS | Status: AC
  Administered 2024-12-24: 8 mg via INTRAVENOUS
  Filled 2024-12-23 (×3): qty 54
  Filled 2024-12-23: qty 8

## 2024-12-23 MED ORDER — TRAVASOL 10 % IV SOLN
INTRAVENOUS | Status: AC
  Filled 2024-12-23: qty 1188

## 2024-12-23 NOTE — Progress Notes (Signed)
 " PROGRESS NOTE Henry May  FMW:[id number] DOB: 1966-01-08 DOA: 11/27/2024 PCP: [name], DO  Brief Narrative/Hospital Course: Henry May is a 59 y.o. male with PMH of presented to [facility] due to nausea, vomiting and coffee-ground emesis 2 days after he started new chemotherapy, and admitted for intractable nausea and vomiting, dilated small bowel loops and coffee-ground emesis.Recent EGD on 12/4 with LA grade D esophagitis without bleeding, and large infiltrative, sessile and ulcerated circumferential mass with contact of bleeding in gastric body and antrum with antral narrowing and luminal diameter of approximately 1.5 cm   In ED,  vss stable.CBC and CMP without significant finding.  Lipase 171. CT abdomen and pelvis>>dilated proximal small bowel suggesting SBO but without discrete transition point, thick walled gastric body/antrum without definite focal mass, mild nodularity along the pylorus, scarring with superimposed patchy peribronchovascular nodularity in the bilateral lower lobes right more than left suggesting mild aspiration or pneumonia.   GI, surgery oncology consulted EGD>> s/p gastric stent placement on 12/16.Was unable to tolerate diet.  Started on TPN.Plan was for discharge with TPN once logistic in place but nausea and vomiting gotten worse after stopping octreotide  and Decadron  and having hemoptysis with drop in hemoglobin and restarted on octreotide  infusion.   CT chest, abdomen and pelvis : showed new patchy substernal pneumomediastinum of indeterminate source, patchy consolidation in lower lobes and posterior LUL, esophageal distention with retained/reflux fluid in thoracic inlet, emphysematous cystitis and circumferential gastric thickening with adjacent stranding change, antropyloric stenting and continued duodenal dilation, thickening jejunal folds.  Overall with ongoing bleeding felt secondary to metastatic gastric carcinoma Oncology radiation following and on  chemotherapy along with XRT inhouse-to decrease tumor burden and provide symptomatic relief.  Subjective: Seen and examined Patient resting comfortably family at the bedside Reported 2 episode of hemaemesis this am, no BM yet, about to pass gas  Vitals remained stable overnight, labs with leukopenia, hemoglobin downtrending 7.3  remains on TPN No new complaints  Assessment and plan:  Gastric cancer-f/b Dr. 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 Currentlyon  weekly Cisplatin  ( got on 1/7), on XRT x 10 doses ( started 1/2, 1/5>>) to decrease tumor burden, obstruction and bleeding.  Back on octreotide  infusion from 12/26, remains on TPN-pharmacy managing Continue SSI, PPI twice daily, Carafate  1 g tid, Antiemetics, folic acid -and above plan as per oncology. Patient completed Unasyn  1/2, s/p multiple PRBC transfusion-transfusing 2 more units today, x-ray abd 1/8-nonobstructive  Continue aspiration precaution, supportive care, transfusion to keep hemoglobin above 8.  Getting ferric gluconate 250 mg x 4 doses with Decadron  per hemonc 1/6>> Palliative following and underwent family meeting. Recent Labs  Lab 12/19/24 1624 12/20/24 0351 12/21/24 0416 12/22/24 0409 12/23/24 0440  HGB 8.7* 7.9* 8.1* 8.3* 7.3*  HCT 25.6* 24.0* 24.3* 25.5* 23.0*    Aspiration pneumonitis/pneumonia Pneumomediastinum noted on the CT chest: Patient completed Unasyn  1/2, patient precaution.  Keep on the aspiration precaution. Pneumomediastinum likely from retching and vomiting.  Monitor.  Leukopenia: Monitor while on chemo.    Diabetes-diet controlled Hemoglobin A1c 5.7% (11/28/2024). Cbg ok on ssi while on TPN   History of DVT Status post IVC filter.     Elevated  lipase Improved.   Insomnia mainly due to intractable nausea and vomiting Cont IV Ativan  and Phenergan  as above.  Lightheadedness/presyncope Deconditioning: Likely secondary  to intractable nausea vomiting hematemesis and volume depletion.  Severe malnutrition On TPN as above. Body mass index is 22.22 kg/m.   Mobility: PT Orders:  PT Follow up Rec: No Pt Follow Up1/05/2025 1434   DVT prophylaxis: Place and maintain sequential compression device Start: 12/15/24 1907 SCDs Start: 11/28/24 1042 no chemical prophylaxis due to hematemesis/GI bleeding Code Status:   Code Status: Limited: Do not attempt resuscitation (DNR) -DNR-LIMITED -Do Not Intubate/DNI  Family Communication: plan of care discussed with patient and family at bedside. Patient status is: Remains hospitalized because of severity of illness Level of care: Progressive   Dispo: The patient is from: home            Anticipated disposition: TBD Objective: Vitals last 24 hrs: Vitals:   12/22/24 1329 12/22/24 2107 12/23/24 0608 12/23/24 0611  BP: 107/64 110/66 116/73   Pulse: 64 70 71   Resp: 20 18 17    Temp: 97.8 F (36.6 C) 98.7 F (37.1 C) 98.5 F (36.9 C)   TempSrc: Oral     SpO2: 99% 96% 98%   Weight:    74.3 kg  Height:        Physical Examination: General exam: AAOX3, thin frail, cachectic and older than the stated age HEENT:Oral mucosa moist, Ear/Nose WNL grossly Respiratory system: Bilaterally clear BS Cardiovascular system: S1 & S2 +, No JVD. Gastrointestinal system: Abdomen soft with mild tenderness,ND, BS+ Nervous System: Alert, awake, moving all extremities,and following commands. Extremities: extremities warm, leg edema mild Skin: Warm, no rashes MSK: thin muscle bulk,tone, power   Medications reviewed:  Scheduled Meds:  Chlorhexidine  Gluconate Cloth  6 each Topical Daily   furosemide   20 mg Intravenous Once   furosemide   20 mg Intravenous Once   insulin  aspart  0-15 Units Subcutaneous Q6H    mouth rinse  15 mL Mouth Rinse 4 times per day   pantoprazole  (PROTONIX ) IV  40 mg Intravenous Q12H   sucralfate   1 g Oral TID   Continuous Infusions:  ferric gluconate (FERRLECIT) IVPB 250 mg (12/22/24 1629)   octreotide  (SANDOSTATIN ) 500 mcg in sodium chloride  0.9 % 250 mL (2 mcg/mL) infusion 50 mcg/hr (12/23/24 0402)   promethazine  (PHENERGAN ) injection (IM or IVPB) 25 mg (12/23/24 0621)   TPN ADULT (ION) 90 mL/hr at 12/22/24 1712   TPN ADULT (ION)     Diet: Diet Order             Diet NPO time specified Except for: Ice Chips, Sips with Meds, Other (See Comments)  Diet effective now                  Data Reviewed: I have personally reviewed following labs and imaging studies ( see epic result tab) CBC: Recent Labs  Lab 12/19/24 0446 12/19/24 1624 12/20/24 0351 12/21/24 0416 12/22/24 0409 12/23/24 0440  WBC 2.5*  --  3.6* 2.6* 3.4* 3.1*  NEUTROABS  --   --   --  1.9 2.4 2.2  HGB 9.7* 8.7* 7.9* 8.1* 8.3* 7.3*  HCT 28.8* 25.6* 24.0* 24.3* 25.5* 23.0*  MCV 87.3  --  88.9 89.7 91.4 92.0  PLT 256  --  229 245 291 284   CMP: Recent Labs  Lab 12/17/24 0341 12/18/24 0513 12/19/24 0446 12/20/24 0351 12/21/24 0416 12/22/24 0409 12/23/24 0440  NA 137 135 135 138 138 137 136  K 4.0 4.2 3.8 3.8 4.1 4.4 4.5  CL 103 101 101 104 105 103 104  CO2 25 24 25  26 27 26 24   GLUCOSE 131* 144* 122* 125* 134* 121* 139*  BUN 31* 29* 29* 29* 26* 24* 21*  CREATININE 0.66 0.59* 0.64 0.59* 0.52* 0.56* 0.50*  CALCIUM  7.9* 7.4* 7.8* 7.8* 7.7* 8.1* 8.1*  MG 2.1 1.9  --  2.1  --   --  2.5*  PHOS 2.9 2.6  --  2.5  --  3.1 2.9   GFR: Estimated Creatinine Clearance: 103.2 mL/min (A) (by C-G formula based on SCr of 0.5 mg/dL (L)). Recent Labs  Lab 12/20/24 0351 12/22/24 0409 12/23/24 0440  AST 17  --  17  ALT 34  --  32  ALKPHOS 134*  --  145*  BILITOT 0.7  --  0.4  PROT 5.2*  --  5.7*  ALBUMIN  2.3* 2.5* 2.6*   No results for input(s): LIPASE, AMYLASE in the last 168 hours. No  results for input(s): AMMONIA in the last 168 hours. Coagulation Profile: No results for input(s): INR, PROTIME in the last 168 hours. Unresulted Labs (From admission, onward)     Start     Ordered   12/23/24 0657  Type and screen [facility]  (Blood Administration Adult)  Once,   R       Comments: [facility]    12/23/24 0657   12/23/24 0657  Prepare RBC (crossmatch)  (Blood Administration Adult)  Once,   R       Question Answer Comment  # of Units 2 units   Transfusion Indications Hemoglobin 8 gm/dL or less and orthopedic or cardiac surgery or pre-existing cardiac condition   Number of Units to Keep Ahead NO units ahead   Instructions: Transfuse     Placed in And Linked Group   12/23/24 0657   12/21/24 0500  CBC with Differential/Platelet  Daily,   R     Question:  Specimen collection method  Answer:  Lab=Lab collect   12/20/24 0651   12/16/24 0500  Phosphorus  (Standard TPN Labs (all labs to be drawn at 0500))  Every Mon,Thu (0500),   R     Question:  Specimen collection method  Answer:  IV Team=IV Team collect   12/15/24 0741   12/06/24 0500  Comprehensive metabolic panel  (TPN Lab Panel)  Every Mon,Thu (0500),   R     Question:  Specimen collection method  Answer:  IV Team=IV Team collect   12/04/24 1026   12/06/24 0500  Magnesium   (TPN Lab Panel)  Every Mon,Thu (0500),   R     Question:  Specimen collection method  Answer:  IV Team=IV Team collect   12/04/24 1026   12/06/24 0500  Triglycerides  (TPN Lab Panel)  Every Monday (0500),   R     Question:  Specimen collection method  Answer:  IV Team=IV Team collect   12/04/24 1026           Antimicrobials/Microbiology: Anti-infectives (From admission, onward)    Start     Dose/Rate Route Frequency Ordered Stop   12/11/24 0900  Ampicillin -Sulbactam (UNASYN ) 3 g in sodium chloride  0.9 % 100 mL IVPB        3 g 200 mL/hr over 30 Minutes Intravenous Every 6 hours 12/11/24 0817  12/18/24 0950   11/28/24 1200  Ampicillin -Sulbactam (UNASYN ) 3 g in sodium chloride  0.9 % 100 mL IVPB  Status:  Discontinued        3 g 200 mL/hr over 30 Minutes Intravenous Every 6 hours 11/28/24  1048 12/05/24 0909   11/28/24 0045  Ampicillin -Sulbactam (UNASYN ) 3 g in sodium chloride  0.9 % 100 mL IVPB        3 g 200 mL/hr over 30 Minutes Intravenous  Once 11/28/24 0042 11/28/24 0134         Component Value Date/Time   SDES  02/05/2024 1904    BLOOD BLOOD RIGHT ARM Performed at [facility], [address], [place], [place] [address]    [facility]  02/05/2024 1904    Blood Culture adequate volume BOTTLES DRAWN AEROBIC AND ANAEROBIC Performed at [facility], [address], [place], [place] [address]    CULT  02/05/2024 1904    NO GROWTH 5 DAYS Performed at [facility], [address], [place], [place] [address]    REPTSTATUS 02/10/2024 FINAL 02/05/2024 1904    Procedures: Procedures (LRB): EGD (ESOPHAGOGASTRODUODENOSCOPY) (N/A) INSERTION, STENT, DUODENUM (N/A)   Mennie LAMY, MD [facility] 12/23/2024, 10:04 AM   "

## 2024-12-23 NOTE — Progress Notes (Signed)
 PHARMACY - TOTAL PARENTERAL NUTRITION CONSULT NOTE   Indication: intolerance to enteral feeds  Patient Measurements: Height: 6' (182.9 cm) Weight: 74.3 kg (163 lb 12.8 oz) IBW/kg (Calculated) : 77.6 TPN AdjBW (KG): 70 Body mass index is 22.22 kg/m.  Assessment: 57 yoM admitted on 12/13 with intractable nausea and vomiting, dilated small bowel loops, and coffee-ground emesis. 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  He underwent EGD 12/16 showing esophagitis, gastric stensosis, stent placed, impaired peristalsis. Pharmacy was consulted to dose TPN on 12/20 for intolerance to enteral feeding.    Glucose / Insulin : No hx DM, no PTA meds. A1c 5.7% on 12/14 -BG goal <180. Range: 121-180 (13 units SSI/24 hrs) -Dexamethasone  10 mg 1/7 with chemo -Octreotide  infusion resumed 12/25 at 50 mcg/hr (noted that octreotide , especially at higher doses/frequencies, can cause glucose dysregulation) Electrolytes: WNL, including CorrCa (9.2); Mg 2.5 minimally elevated likely s/t additional supplementation with chemo yesterday Renal: SCr < 1, BUN trending down Hepatic: WNL, alk phos minimally elevated -Trig trending down, WNL at 99 -Alb low at 2.6, but overall stable Intake / Output:  -UOP mostly undocumented -Continues to have emesis, appears to be less than prior -- approximately 350 ml previous 24 hrs -LBM 1/4 GI Imaging: -12/18 Abd xray: No bowel dilatation or evidence of obstruction. Retained gastric fluid. 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 Prosthesis placed. Duodenal dilation deformity  -12/25 DG abd: nonobstructive bowel gas pattern -12/27 DG abd: nonobstructive bowel gas pattern -12/28 chest/abd CT: Esophageal distention. Nonspecific Enteritis. New mildly enlarged right hilar lymph node and a few slightly prominent bilateral hilar lymph nodes. New small volume of ascites  GI Surgeries / Procedures:  -12/16 EGD: esophagitis  GI meds:    -Protonix  40 mg IV q12h  -Octreotide  infusion at 50 mcg/hr   Central access: Implanted Port (01/22/24) TPN start date: 12/20   Nutritional Goals: Goal TPN rate is 90 mL/hr and provides 118 g of protein and 2298 kcals per day  RD Assessment: Estimated Needs Total Energy Estimated Needs: 2100-2450 kcals Total Protein Estimated Needs: 105-120 grams Total Fluid Estimated Needs: >/= 2.1L  Current Nutrition:  NPO except for ice chips, sips with meds or for comfort TPN  TPN transitioned back to continuous infusion on 1/1. Once pt is otherwise medically stable and no longer requiring octreotide  infusion, can transition back to cyclic TPN. This can be done in the outpatient setting if necessary. MD aware.  Plan:  Continue TPN at goal rate of 90 mL/hr Electrolytes in TPN: Na 100 mEq/L K 40 mEq/L Ca 2 mEq/L Mg 5 mEq/L Phos 20 mmol/L Cl:Ac  to 1:1 Add standard MVI and trace elements to TPN Continue mSSI q6h Monitor TPN labs on Mon/Thurs   Stefano MARLA Bologna, PharmD, BCPS Clinical Pharmacist 12/23/2024 9:50 AM

## 2024-12-23 NOTE — Progress Notes (Signed)
 He is still having some nausea and vomiting.  He still having some hematemesis.  We have got chemotherapy yesterday with the cisplatin .  He continues on radiotherapy.  He is getting the TPN.  He is tolerating this fairly well.  He has had no pain.  He is out of bed.  He is having very little in the way of bowel movements.  I do not think it would be a bad idea to check another abdominal x-ray to make sure there is no obstruction.  His labs show sodium 136.  Potassium 4.5.  BUN 21 creatinine 0.5.  Calcium  8.1 with an albumin  of 2.6.  His white cell count is 3.1.  Hemoglobin 7.3.  His platelet count 284,000.  I do think he will need 2 units of blood.  I think this will help.  Maybe, it will provide a little bit of coagulation affect.  He has had no cough.  He has been no fever.  He has had no rashes.  Has been no leg swelling.  His vital signs show a temperature of 98.5.  Pulse 71.  Blood pressure 116/73.  Head and neck exam shows no ocular or oral lesions.  He has some slight flushing of the face.  His lungs are clear bilaterally.  Cardiac exam regular rate and rhythm.  No murmurs.  Abdomen is soft.  Bowel sounds are decreased.  There is no fluid wave.  There is no guarding or rebound tenderness.  Extremity shows no clubbing, cyanosis or edema.  Neurological exam is nonfocal.   Again, Mr. Delmont has metastatic gastric cancer.  He is getting radiation therapy with weekly cisplatin  to try to help with the obstruction and with bleeding.  Hopefully, this will help.  He does have a stent in to try to help keep his gastric outlet open.  I will check an abdominal x-ray on him today to make sure he still has no blockage.  As always, we had a very good prayer.  He has been probably strong faith.  He is thankful for the great care that he is getting by everybody up on 4 W.   Jeralyn Crease, MD  [employer]:9

## 2024-12-23 NOTE — Progress Notes (Signed)
 Nutrition Follow-up  DOCUMENTATION CODES:   Severe malnutrition in context of chronic illness  INTERVENTION:  - TPN remains at goal, meeting 100% of estimated needs.             - TPN management per pharmacy.    - Daily weights while on TPN.   NUTRITION DIAGNOSIS:   Severe Malnutrition related to chronic illness (gastric cancer) as evidenced by severe fat depletion, severe muscle depletion, percent weight loss (24% weight loss in less than 3 months). *ongoing  GOAL:   Patient will meet greater than or equal to 90% of their needs *met with TPN  MONITOR:   Diet advancement, Labs, Weight trends  REASON FOR ASSESSMENT:   Malnutrition Screening Tool, NPO/Clear Liquid Diet (NPO x3 days)    ASSESSMENT:   59 y.o. male with PMH of gastric cancer on chemo and recent esophagitis who presented due to nausea, vomiting and coffee-ground emesis 2 days after he started new chemotherapy. Admitted for intractable nausea and vomiting, dilated small bowel loops with concern for partial SBO, and coffee-ground emesis.  12/13 Admit; NPO  12/16 s/p upper GI endoscopy with gastric stent placement and NGT placement; CLD 12/17 NPO 12/18 CLD  12/19 FLD 12/20 CLD; TPN initiated 12/21 NGT came out 12/22 NPO; TPN increased to goal 12/23 TPN changed to cycle x18 hours 12/24 TPN changed to cycle x12 hours 12/27 TPN changed to cycle x18 hours due to lack of CBG control (pharmacy suspects from Octreotide  infusion) 1/1 TPN changed back to 24 hour continuous infusion   Patient/family report no issues. TPN remains at goal of 27mL/hr.   Patient continues to have ongoing nausea and vomiting with inability to tolerate oral intake, remains NPO.  Of note, weight up from initial admission weight. Patient is noted to be 35L+. Will monitor weight trends.    Admit weight: 154# Current weight: 163# I&O's: +35.8L since 12/25   Medications reviewed and include: Protonix , Carafate , Phenergan  Q4H, Octreotide      Labs reviewed:  Triglycerides 99 (as of 1/5) Blood Glucose: 121-180 x24 hours  Diet Order:   Diet Order             Diet NPO time specified Except for: Ice Chips, Sips with Meds, Other (See Comments)  Diet effective now                   EDUCATION NEEDS:  Education needs have been addressed  Skin:  Skin Assessment: Reviewed RN Assessment  Last BM:  1/4  Height:  Ht Readings from Last 1 Encounters:  11/30/24 6' (1.829 m)   Weight:  Wt Readings from Last 1 Encounters:  12/23/24 74.3 kg   Ideal Body Weight:  80.91 kg  BMI:  Body mass index is 22.22 kg/m.  Estimated Nutritional Needs:  Kcal:  2100-2450 kcals Protein:  105-120 grams Fluid:  >/= 2.1L     Trude Ned RD, LDN Contact via Secure Chat.

## 2024-12-23 NOTE — Plan of Care (Incomplete)

## 2024-12-23 NOTE — TOC Progression Note (Signed)
 Transition of Care [facility]) - Progression Note    Patient Details  Name: Trent Gabler MRN: [id number] Date of Birth: 1966/02/22  Transition of Care [facility]) CM/SW Contact  Sonda Manuella Quill, RN Phone Number: 12/23/2024, 10:54 AM  Clinical Narrative:    Pt not ready for d/c; IP CM is following.     Barriers to Discharge: Continued Medical Work up               Expected Discharge Plan and Services                                               Social Drivers of Health (SDOH) Interventions SDOH Screenings   Food Insecurity: Patient Declined (11/28/2024)  Housing: Unknown (11/28/2024)  Transportation Needs: Patient Declined (11/28/2024)  Utilities: Patient Declined (11/28/2024)  Depression (PHQ2-9): Medium Risk (11/26/2024)  Social Connections: Unknown (04/30/2022)   Received from [facility]  Tobacco Use: Medium Risk (11/30/2024)    Readmission Risk Interventions    12/06/2024   10:13 AM 02/07/2024    8:40 AM  Readmission Risk Prevention Plan  Transportation Screening Complete Complete  HRI or Home Care Consult Complete Complete  Social Work Consult for Recovery Care Planning/Counseling Complete Complete  Palliative Care Screening Not Applicable Not Applicable  Medication Review Oceanographer) Complete Complete

## 2024-12-24 ENCOUNTER — Ambulatory Visit: Payer: Self-pay

## 2024-12-24 ENCOUNTER — Other Ambulatory Visit: Payer: Self-pay

## 2024-12-24 ENCOUNTER — Ambulatory Visit

## 2024-12-24 DIAGNOSIS — K920 Hematemesis: Secondary | ICD-10-CM | POA: Diagnosis not present

## 2024-12-24 LAB — BPAM RBC
Blood Product Expiration Date: 202602092359
Blood Product Expiration Date: 202602092359
ISSUE DATE / TIME: 202601081307
ISSUE DATE / TIME: 202601081605
Unit Type and Rh: 5100
Unit Type and Rh: 5100

## 2024-12-24 LAB — TYPE AND SCREEN
ABO/RH(D): O POS
Antibody Screen: NEGATIVE
Unit division: 0
Unit division: 0

## 2024-12-24 LAB — CBC WITH DIFFERENTIAL/PLATELET
Abs Immature Granulocytes: 0.17 K/uL — ABNORMAL HIGH (ref 0.00–0.07)
Basophils Absolute: 0 K/uL (ref 0.0–0.1)
Basophils Relative: 1 %
Eosinophils Absolute: 0 K/uL (ref 0.0–0.5)
Eosinophils Relative: 1 %
HCT: 30.1 % — ABNORMAL LOW (ref 39.0–52.0)
Hemoglobin: 10.3 g/dL — ABNORMAL LOW (ref 13.0–17.0)
Immature Granulocytes: 5 %
Lymphocytes Relative: 8 %
Lymphs Abs: 0.3 K/uL — ABNORMAL LOW (ref 0.7–4.0)
MCH: 30.7 pg (ref 26.0–34.0)
MCHC: 34.2 g/dL (ref 30.0–36.0)
MCV: 89.6 fL (ref 80.0–100.0)
Monocytes Absolute: 0.1 K/uL (ref 0.1–1.0)
Monocytes Relative: 4 %
Neutro Abs: 2.6 K/uL (ref 1.7–7.7)
Neutrophils Relative %: 81 %
Platelets: 270 K/uL (ref 150–400)
RBC: 3.36 MIL/uL — ABNORMAL LOW (ref 4.22–5.81)
RDW: 15.5 % (ref 11.5–15.5)
Smear Review: NORMAL
WBC: 3.2 K/uL — ABNORMAL LOW (ref 4.0–10.5)
nRBC: 1.2 % — ABNORMAL HIGH (ref 0.0–0.2)

## 2024-12-24 LAB — RAD ONC ARIA SESSION SUMMARY
Course Elapsed Days: 7
Plan Fractions Treated to Date: 5
Plan Prescribed Dose Per Fraction: 3 Gy
Plan Total Fractions Prescribed: 9
Plan Total Prescribed Dose: 27 Gy
Reference Point Dosage Given to Date: 15 Gy
Reference Point Session Dosage Given: 3 Gy
Session Number: 6

## 2024-12-24 LAB — GLUCOSE, CAPILLARY
Glucose-Capillary: 127 mg/dL — ABNORMAL HIGH (ref 70–99)
Glucose-Capillary: 129 mg/dL — ABNORMAL HIGH (ref 70–99)
Glucose-Capillary: 130 mg/dL — ABNORMAL HIGH (ref 70–99)
Glucose-Capillary: 119 mg/dL — ABNORMAL HIGH (ref 70–99)

## 2024-12-24 MED ORDER — CARMEX CLASSIC LIP BALM EX OINT
TOPICAL_OINTMENT | CUTANEOUS | Status: DC | PRN
  Filled 2024-12-24: qty 10

## 2024-12-24 MED ORDER — INSULIN ASPART 100 UNIT/ML IJ SOLN
0.0000 [IU] | INTRAMUSCULAR | Status: DC
  Administered 2024-12-24 – 2024-12-27 (×7): 2 [IU] via SUBCUTANEOUS
  Filled 2024-12-24: qty 2
  Filled 2024-12-24: qty 1
  Filled 2024-12-24 (×5): qty 2

## 2024-12-24 MED ORDER — TRAVASOL 10 % IV SOLN
INTRAVENOUS | Status: AC
  Filled 2024-12-24: qty 1188

## 2024-12-24 NOTE — TOC Progression Note (Signed)
 Transition of Care [facility]) - Progression Note    Patient Details  Name: Henry May MRN: [id number] Date of Birth: 1966-02-02  Transition of Care [facility]) CM/SW Contact  Sonda Manuella Quill, RN Phone Number: 12/24/2024, 5:40 PM  Clinical Narrative:    Pt started clear liquids; still has TPN; IP CM is following.     Barriers to Discharge: Continued Medical Work up               Expected Discharge Plan and Services                                               Social Drivers of Health (SDOH) Interventions SDOH Screenings   Food Insecurity: Patient Declined (11/28/2024)  Housing: Unknown (11/28/2024)  Transportation Needs: Patient Declined (11/28/2024)  Utilities: Patient Declined (11/28/2024)  Depression (PHQ2-9): Medium Risk (11/26/2024)  Social Connections: Unknown (04/30/2022)   Received from [facility]  Tobacco Use: Medium Risk (11/30/2024)    Readmission Risk Interventions    12/06/2024   10:13 AM 02/07/2024    8:40 AM  Readmission Risk Prevention Plan  Transportation Screening Complete Complete  HRI or Home Care Consult Complete Complete  Social Work Consult for Recovery Care Planning/Counseling Complete Complete  Palliative Care Screening Not Applicable Not Applicable  Medication Review Oceanographer) Complete Complete

## 2024-12-24 NOTE — Progress Notes (Addendum)
 PHARMACY - TOTAL PARENTERAL NUTRITION CONSULT NOTE   Indication: intolerance to enteral feeds  Patient Measurements: Height: 6' (182.9 cm) Weight: 67.7 kg (149 lb 4.8 oz) IBW/kg (Calculated) : 77.6 TPN AdjBW (KG): 70 Body mass index is 20.25 kg/m.  Assessment: 23 yoM admitted on 12/13 with intractable nausea and vomiting, dilated small bowel loops, and coffee-ground emesis. 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  He underwent EGD 12/16 showing esophagitis, gastric stensosis, stent placed, impaired peristalsis. Pharmacy was consulted to dose TPN on 12/20 for intolerance to enteral feeding.    Glucose / Insulin : No hx DM, no PTA meds. A1c 5.7% on 12/14 -BG goal <180. Range: 124-127 (10 units SSI/24 hrs) -Dexamethasone  10 mg 1/7 with chemo -Octreotide  infusion resumed 12/25 at 50 mcg/hr (noted that octreotide , especially at higher doses/frequencies, can cause glucose dysregulation), discontinued 1/8 pm Electrolytes: WNL, including CorrCa (9.2); Mg 2.5 minimally elevated likely s/t additional supplementation with chemo 1/7 Renal: SCr < 1, BUN trending down Hepatic: WNL, alk phos minimally elevated -Trig trending down, WNL at 99 -Alb low at 2.6, but overall stable Intake / Output:  -UOP mostly undocumented -Continues to have emesis, appeared to be decreasing however ~ 2 L documented 1/8 -LBM 1/8 GI Imaging: -12/18 Abd xray: No bowel dilatation or evidence of obstruction. Retained gastric fluid. 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 Prosthesis placed. Duodenal dilation deformity  -12/25 DG abd: nonobstructive bowel gas pattern -12/27 DG abd: nonobstructive bowel gas pattern -12/28 chest/abd CT: Esophageal distention. Nonspecific Enteritis. New mildly enlarged right hilar lymph node and a few slightly prominent bilateral hilar lymph nodes. New small volume of ascites  GI Surgeries / Procedures:  -12/16 EGD: esophagitis  GI meds:    -Protonix  80 mg IV q12h (increased 1/8) -Octreotide  infusion discontinued 1/8  Central access: Implanted Port (01/22/24) TPN start date: 12/20   Nutritional Goals: Goal TPN rate is 90 mL/hr and provides 118 g of protein and 2298 kcals per day  RD Assessment: Estimated Needs Total Energy Estimated Needs: 2100-2450 kcals Total Protein Estimated Needs: 105-120 grams Total Fluid Estimated Needs: >/= 2.1L  Current Nutrition:  NPO except for ice chips, sips with meds or for comfort TPN   Plan:  Continue TPN - we will plan to cycle over 18 hours starting tonight as patient is now off octreotide  infusion and glucose trend is stable Electrolytes in TPN: Na 100 mEq/L K 40 mEq/L Ca 2 mEq/L Mg 5 mEq/L Phos 20 mmol/L Cl:Ac  to 1:1 Add standard MVI and trace elements to TPN Continue mSSI - will adjust schedule to 2 hrs post TPN start, halfway through cycle, 1 hr post TPN stop and about halfway between TPN stop/start Monitor TPN labs on Mon/Thurs, will recheck lytes 1/10 but overall have been stable   Stefano MARLA Bologna, PharmD, BCPS Clinical Pharmacist 12/24/2024 9:44 AM

## 2024-12-24 NOTE — Plan of Care (Incomplete)
" °  Problem: Education: Goal: Knowledge of General Education information will improve Description: Including pain rating scale, medication(s)/side effects and non-pharmacologic comfort measures Outcome: Progressing   Problem: Health Behavior/Discharge Planning: Goal: Ability to manage health-related needs will improve Outcome: Progressing   Problem: Clinical Measurements: Goal: Ability to maintain clinical measurements within normal limits will improve Outcome: Progressing Goal: Will remain free from infection Outcome: Progressing Goal: Diagnostic test results will improve Outcome: Progressing Goal: Respiratory complications will improve Outcome: Progressing   Problem: Activity: Goal: Risk for activity intolerance will decrease Outcome: Progressing   Problem: Nutrition: Goal: Adequate nutrition will be maintained Outcome: Progressing   Problem: Coping: Goal: Level of anxiety will decrease Outcome: Progressing   Problem: Clinical Measurements: Goal: Cardiovascular complication will be avoided Outcome: Adequate for Discharge   "

## 2024-12-24 NOTE — Progress Notes (Signed)
 " PROGRESS NOTE Henry May  FMW:[id number] DOB: 1966-07-21 DOA: 11/27/2024 PCP: [name], DO  Brief Narrative/Hospital Course: Henry May is a 59 y.o. male with PMH of presented to [facility] due to nausea, vomiting and coffee-ground emesis 2 days after he started new chemotherapy, and admitted for intractable nausea and vomiting, dilated small bowel loops and coffee-ground emesis.Recent EGD on 12/4 with LA grade D esophagitis without bleeding, and large infiltrative, sessile and ulcerated circumferential mass with contact of bleeding in gastric body and antrum with antral narrowing and luminal diameter of approximately 1.5 cm   In ED,  vss stable.CBC and CMP without significant finding.  Lipase 171. CT abdomen and pelvis>>dilated proximal small bowel suggesting SBO but without discrete transition point, thick walled gastric body/antrum without definite focal mass, mild nodularity along the pylorus, scarring with superimposed patchy peribronchovascular nodularity in the bilateral lower lobes right more than left suggesting mild aspiration or pneumonia.   GI, surgery oncology consulted EGD>> s/p gastric stent placement on 12/16.Was unable to tolerate diet.  Started on TPN.Plan was for discharge with TPN once logistic in place but nausea and vomiting gotten worse after stopping octreotide  and Decadron  and having hemoptysis with drop in hemoglobin and restarted on octreotide  infusion.   CT chest, abdomen and pelvis : showed new patchy substernal pneumomediastinum of indeterminate source, patchy consolidation in lower lobes and posterior LUL, esophageal distention with retained/reflux fluid in thoracic inlet, emphysematous cystitis and circumferential gastric thickening with adjacent stranding change, antropyloric stenting and continued duodenal dilation, thickening jejunal folds.  Overall with ongoing bleeding felt secondary to metastatic gastric carcinoma Oncology radiation following and on  chemotherapy along with XRT inhouse-to decrease tumor burden and provide symptomatic relief.  Subjective: Seen and examined No more hematemesis today had about 4-5 episodes yesterday, had small brown bowel movement yesterday, no nausea.  Wants to try some liquid Overnight mild low-grade fever intermittent tachycardia, on 2 L nasal cannula.  Recheck labs shows improving hemoglobin at 10, still leukopenic plt ok  Assessment and plan:  Gastric cancer-f/b Dr. 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 Currentlyon  weekly Cisplatin  ( got on 1/7), on XRT x 10 doses ( started 1/2, 1/5>>) to decrease tumor burden, obstruction and bleeding.  Back on octreotide  infusion from 12/26, remains on TPN-pharmacy managing Continues to have hematemesis-hoping ongoing chemotherapy and radiation will improve the symptoms-scheduled Zofran  has been added 1/8. S/P ferric gluconate 250 mg x 4 doses with Decadron  per hemonc 1/6>> Continue SSI, PPI twice daily, Carafate  1 g tid, Antiemetics, folic acid -and above plan as per oncology. Patient completed Unasyn  1/2, s/p multiple PRBC  again had 2 more units PRBC 1/8-repeat H&H improved  Continue aspiration precaution, supportive care, transfusion to keep hemoglobin above 8. Palliative following As per oncology--follow-up results of NGS testing on the gastric mass to look for additional treatment options   x-ray abd 1/8-nonobstructive and no more nausea- will start clear liquid diet Recent Labs  Lab 12/21/24 0416 12/22/24 0409 12/23/24 0440 12/23/24 2232 12/24/24 0424  HGB 8.1* 8.3* 7.3* 10.0* 10.3*  HCT 24.3* 25.5* 23.0* 30.0* 30.1*    Aspiration pneumonitis/pneumonia Pneumomediastinum noted on the CT chest: Pneumomediastinum  likely from retching and vomiting. Patient completed Unasyn  1/2 cont aspiration precaution.  Leukopenia: Monitor while on chemo.    Diabetes-diet controlled Hemoglobin A1c 5.7% (11/28/2024).  Cbg ok on ssi while on TPN   History of DVT Status post IVC filter.     Elevated lipase Improved.   Insomnia mainly due to intractable nausea and vomiting Cont IV Ativan  and Phenergan . zofran  as above.  Lightheadedness/presyncope Deconditioning: Likely secondary to intractable nausea vomiting hematemesis and volume depletion.  Severe malnutrition On TPN as above. Body mass index is 20.25 kg/m.   Mobility: PT Orders:  PT Follow up Rec: No Pt Follow Up1/05/2025 1434   DVT prophylaxis: Place and maintain sequential compression device Start: 12/15/24 1907 SCDs Start: 11/28/24 1042 no chemical prophylaxis due to hematemesis/GI bleeding Code Status:   Code Status: Limited: Do not attempt resuscitation (DNR) -DNR-LIMITED -Do Not Intubate/DNI  Family Communication: plan of care discussed with patient and family at bedside. Patient status is: Remains hospitalized because of severity of illness Level of care: Progressive   Dispo: The patient is from: home            Anticipated disposition: TBD Objective: Vitals last 24 hrs: Vitals:   12/23/24 2045 12/24/24 0245 12/24/24 0449 12/24/24 0608  BP: 108/69 135/85 116/84 112/67  Pulse: 76 (!) 102 (!) 114 (!) 102  Resp: 20 (!) 22 20 (!) 25  Temp: 98.1 F (36.7 C) 97.7 F (36.5 C) (!) 100.4 F (38 C) 98.4 F (36.9 C)  TempSrc: Oral Oral Oral Oral  SpO2: 95% 98% 93% 95%  Weight:   67.7 kg   Height:        Physical Examination: General exam: AAOX3, cachectic HEENT:Oral mucosa moist, Ear/Nose WNL grossly Respiratory system: Bilaterally clear BS Cardiovascular system: S1 & S2 +, No JVD. Gastrointestinal system: Abdomen soft  mildly tender,ND, BS+ Nervous System: Alert, awake, moving all extremities,and following commands. Extremities:  extremities warm, leg edema mild Skin: Warm, no rashes MSK: thin muscle bulk,tone, power   Medications reviewed:  Scheduled Meds:  Chlorhexidine  Gluconate Cloth  6 each Topical Daily   insulin  aspart  0-15 Units Subcutaneous Q6H   mouth rinse  15 mL Mouth Rinse 4 times per day   pantoprazole  (PROTONIX ) IV  80 mg Intravenous Q12H   sucralfate   1 g Oral TID   Continuous Infusions:  ferric gluconate (FERRLECIT) IVPB 250 mg (12/24/24 0905)   ondansetron  Stopped (12/24/24 0529)   promethazine  (PHENERGAN ) injection (IM or IVPB) 25 mg (12/24/24 0852)   TPN ADULT (ION) 90 mL/hr at 12/24/24 0615   TPN CYCLIC-ADULT (ION)     Diet: Diet Order             Diet clear liquid Room service appropriate? Yes; Fluid consistency: Thin  Diet effective now                  Data Reviewed: I have personally reviewed following labs and imaging studies ( see epic result tab) CBC: Recent Labs  Lab 12/20/24 0351 12/21/24 0416 12/22/24 0409 12/23/24 0440 12/23/24 2232 12/24/24 0424  WBC 3.6* 2.6* 3.4* 3.1*  --  3.2*  NEUTROABS  --  1.9 2.4 2.2  --  2.6  HGB 7.9* 8.1* 8.3* 7.3* 10.0* 10.3*  HCT 24.0* 24.3* 25.5* 23.0* 30.0* 30.1*  MCV 88.9 89.7 91.4 92.0  --  89.6  PLT 229 245 291 284  --  270   CMP: Recent Labs  Lab 12/18/24 0513 12/19/24 0446 12/20/24 0351 12/21/24 0416 12/22/24 0409 12/23/24 0440  NA 135 135 138 138 137 136  K 4.2 3.8 3.8 4.1 4.4 4.5  CL 101 101 104 105 103  104  CO2 24 25 26 27 26 24   GLUCOSE 144* 122* 125* 134* 121* 139*  BUN 29* 29* 29* 26* 24* 21*  CREATININE 0.59* 0.64 0.59* 0.52* 0.56* 0.50*  CALCIUM  7.4* 7.8* 7.8* 7.7* 8.1* 8.1*  MG 1.9  --  2.1  --   --  2.5*  PHOS 2.6  --  2.5  --  3.1 2.9   GFR: Estimated Creatinine Clearance: 94 mL/min (A) (by C-G formula based on SCr of 0.5 mg/dL (L)). Recent Labs  Lab 12/20/24 0351 12/22/24 0409 12/23/24 0440  AST 17  --  17  ALT 34  --  32  ALKPHOS 134*  --  145*  BILITOT 0.7  --  0.4  PROT 5.2*  --  5.7*   ALBUMIN  2.3* 2.5* 2.6*   No results for input(s): LIPASE, AMYLASE in the last 168 hours. No results for input(s): AMMONIA in the last 168 hours. Coagulation Profile: No results for input(s): INR, PROTIME in the last 168 hours. Unresulted Labs (From admission, onward)     Start     Ordered   12/25/24 0500  Basic metabolic panel with GFR  Tomorrow morning,   R       Question:  Specimen collection method  Answer:  Lab=Lab collect   12/24/24 0944   12/25/24 0500  Phosphorus  Tomorrow morning,   R       Question:  Specimen collection method  Answer:  Lab=Lab collect   12/24/24 0944   12/25/24 0500  Magnesium   Tomorrow morning,   R       Question:  Specimen collection method  Answer:  Lab=Lab collect   12/24/24 0944   12/21/24 0500  CBC with Differential/Platelet  Daily,   R     Question:  Specimen collection method  Answer:  Lab=Lab collect   12/20/24 0651   12/16/24 0500  Phosphorus  (Standard TPN Labs (all labs to be drawn at 0500))  Every Mon,Thu (0500),   R     Question:  Specimen collection method  Answer:  IV Team=IV Team collect   12/15/24 0741   12/06/24 0500  Comprehensive metabolic panel  (TPN Lab Panel)  Every Mon,Thu (0500),   R     Question:  Specimen collection method  Answer:  IV Team=IV Team collect   12/04/24 1026   12/06/24 0500  Magnesium   (TPN Lab Panel)  Every Mon,Thu (0500),   R     Question:  Specimen collection method  Answer:  IV Team=IV Team collect   12/04/24 1026   12/06/24 0500  Triglycerides  (TPN Lab Panel)  Every Monday (0500),   R     Question:  Specimen collection method  Answer:  IV Team=IV Team collect   12/04/24 1026           Antimicrobials/Microbiology: Anti-infectives (From admission, onward)    Start     Dose/Rate Route Frequency Ordered Stop   12/11/24 0900  Ampicillin -Sulbactam (UNASYN ) 3 g in sodium chloride  0.9 % 100 mL IVPB        3 g 200 mL/hr over 30 Minutes Intravenous Every 6 hours 12/11/24 0817 12/18/24 0950    11/28/24 1200  Ampicillin -Sulbactam (UNASYN ) 3 g in sodium chloride  0.9 % 100 mL IVPB  Status:  Discontinued        3 g 200 mL/hr over 30 Minutes Intravenous Every 6 hours 11/28/24 1048 12/05/24 0909   11/28/24 0045  Ampicillin -Sulbactam (UNASYN ) 3 g in sodium chloride  0.9 %  100 mL IVPB        3 g 200 mL/hr over 30 Minutes Intravenous  Once 11/28/24 0042 11/28/24 0134         Component Value Date/Time   SDES  02/05/2024 1904    BLOOD BLOOD RIGHT ARM Performed at [facility], [address] [place], [place] [address]    [facility]  02/05/2024 1904    Blood Culture adequate volume BOTTLES DRAWN AEROBIC AND ANAEROBIC Performed at [facility], [address], [place], [place] [address]    CULT  02/05/2024 1904    NO GROWTH 5 DAYS Performed at [facility], [address] [place], [place] [address]    REPTSTATUS 02/10/2024 FINAL 02/05/2024 1904    Procedures: Procedures (LRB): EGD (ESOPHAGOGASTRODUODENOSCOPY) (N/A) INSERTION, STENT, DUODENUM (N/A)   Mennie LAMY, MD [facility] 12/24/2024, 9:58 AM   "

## 2024-12-24 NOTE — Progress Notes (Signed)
 IP PROGRESS NOTE  Subjective:   Henry May continues to have intermittent nausea/vomiting and hematemesis.  He reports vomiting 5 times yesterday.  His wife was at the bedside when I saw him at approximately 7 AM.  He denies pain.  He is having small bowel movements.  No flatus.  He has an intermittent productive cough. He completed treatment with cisplatin  on 12/22/2024.  He reports tolerating the chemotherapy well.  He continues palliative radiation to the stomach.  Objective: Vital signs in last 24 hours: Blood pressure 112/67, pulse (!) 102, temperature 98.4 F (36.9 C), temperature source Oral, resp. rate (!) 25, height 6' (1.829 m), weight 149 lb 4.8 oz (67.7 kg), SpO2 95%.  Intake/Output from previous day: 01/08 0701 - 01/09 0700 In: 3754.8 [P.O.:50; I.V.:2243.7; Blood:625; IV Piggyback:836.1] Out: 2725 [Urine:425; Emesis/NG output:2300]  Physical Exam:  HEENT: No thrush Lungs: Inspiratory rhonchi at the right posterior base, no respiratory distress Cardiac: Regular rate and rhythm Abdomen: Soft, nontender, no hepatomegaly Extremities: No leg edema   Portacath/PICC-without erythema  Lab Results: Recent Labs    12/23/24 0440 12/23/24 2232 12/24/24 0424  WBC 3.1*  --  3.2*  HGB 7.3* 10.0* 10.3*  HCT 23.0* 30.0* 30.1*  PLT 284  --  270      BMET Recent Labs    12/22/24 0409 12/23/24 0440  NA 137 136  K 4.4 4.5  CL 103 104  CO2 26 24  GLUCOSE 121* 139*  BUN 24* 21*  CREATININE 0.56* 0.50*  CALCIUM  8.1* 8.1*    Lab Results  Component Value Date   CEA 3.57 01/15/2024      Medications: I have reviewed the patient's current medications.  Assessment/Plan: Gastric cancer 11/21/2023 EGD-gastric body with infiltrative looking lesion; biopsy shows involvement of a hypercellular lesion that suggests diffuse carcinoma by morphology CTs abdomen/pelvis 11/28/2023-diffuse fatty infiltration of the liver; gastric wall thickening; lymph nodes up to 6 mm in  diameter near the stomach wall. 12/31/2023 upper endoscopy-large diffuse friable infiltrative polypoid and ulcerated circumferential mass found in the gastric body.  Scope was passed beyond the mass with normal antrum/pylorus.  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  Negative for HER2 (1+); mismatch repair protein IHC normal; PD-L1 CPS 0%; CLDN18 positive: 90% of tumor cells with 2+/3+ membrane staining PET scan 01/07/2024-circumferential hypermetabolic gastric mucosal thickening.  Ill-defined hypermetabolic lymph nodes in the gastrohepatic ligament.  Intense hypermetabolic activity associated with the right lobe of the thyroid  gland.  Small hypermetabolic right axillary node favored reactive. 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  Gastric wall thickening slightly improved. Cycle 7 oxaliplatin , zolbetuximab 05/05/2024, Fulphila  Cycle 8 zolbetuximab 05/20/2024, oxaliplatin  held due to neuropathy, no Fulphila  Cycle 9 oxaliplatin /zolbetuximab 06/03/2024, Fulphila  Cycle 10 oxaliplatin /zolbetuximab 06/17/2024, Fulphila  Cycle 11 oxaliplatin /zolbetuximab 06/30/2024, Fulphila , oxaliplatin  dose reduced Cycle 12 zolbetuximab 07/15/2024, oxaliplatin  held secondary to neuropathy Cycle 13 zolbetuximab 07/29/2024, oxaliplatin  held secondary to  neuropathy Cycle 14 zolbetuximab 08/26/2024, oxaliplatin  held secondary to neuropathy 09/01/2024 CTs-mild distal gastric wall thickening without discrete mass.  No metastatic disease. Cycle 15  zolbetuximab 09/09/2024, oxaliplatin  held due to neuropathy Cycle 16 zolbetuximab 09/30/2024, oxaliplatin  held due to neuropathy Cycle 17 zolbetuximab 10/21/2024, oxaliplatin  held due to neuropathy 11/03/2024 CT abdomen/pelvis: Diffuse mild wall thickening of the distal gastric body and antrum-stable, mild increase in a right gastric lymph node small volume ascites without omental nodularity, proximal jejunal mural thickening 11/04/2024 upper GI: Narrowing of the gastric antrum, normal gastric emptying, dilated duodenum and proximal jejunum without evidence of a bowel obstruction 11/18/2024 upper endoscopy: Tumor in the gastric body and antrum with antral narrowing, dilated duodenum without evidence of mass or obstruction, esophagitis: Biopsy of the gastric mass-poorly differentiated adenocarcinoma with focal signet ring morphology, esophagus biopsy: Squamous mucosa with focal ulceration Cycle 1 weekly cisplatin  11/26/2024 Cycle 2 weekly cisplatin  12/07/2024 Cycle 3 weekly cisplatin  12/15/2024 Radiation to the gastric mass 12/17/2024 Cycle 4 weekly cisplatin  12/22/2024 12/12/2024 CTs: Pneumomediastinum extending into the neck, lower lobe consolidation, esophagus distention, air in the bladder, new mildly enlarged right hilar node, new small volume ascites Early satiety, postprandial abdominal pain, weight loss secondary to #1 History of bilateral lower extremity DVT 2021-anticoagulation discontinued due to massive retroperitoneal bleed, IVC filter placed 08/24/2020 Hospitalized with acute respiratory failure due to COVID-19 08/06/2020 - 09/04/2020 History of massive retroperitoneal bleed secondary to anticoagulation September 2021 Thyroid  ultrasound 01/13/2024-no abnormal nodule identified in the right lobe.  1.1 cm  thyroid  isthmus nodule meets criteria for 1 year follow-up ultrasound. Admission 02/01/2024 with increased abdominal pain/ascites Hospitalization with altered mental status 02/05/2024 through 02/13/2024; question rare case of 5-FU psychosis.  Improved 02/16/2024.  Mental status at baseline 02/23/2024. Neutropenia 02/16/2024 Mucositis 02/16/2024.  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  Persistent cold sensitivity 05/20/2024, oxaliplatin  held.  Cold sensitivity resolved 06/03/2024, oxaliplatin  resumed.  Mild loss of vibratory sense, oxaliplatin  dose reduced 06/29/2024 Admission 11/28/2024 with intractable nausea and vomiting 11/27/2024 CT Abdo/pelvis: Bilateral lower lobe aspiration/pneumonia, thick-walled gastric body/antrum, dilated proximal small bowel, small volume pelvic ascites 11/30/2024 EGD: Malignant appearing severe stenosis at the gastric antrum-stent placed, dilated duodenum with retained fluid 15.  Anemia secondary to GI bleeding 12/12/2024 2 units RBCs, 2 units RBCs 12/16/2024, 2 units RBCs 12/18/2024, 2 units RBCs 12/23/2024 IV iron  12/21/2024   Mr Cham has metastatic gastric cancer.  There is evidence of disease progression with persistent/progressive tumor in the stomach and intractable nausea/vomiting.  The omental disease on presentation last year remains significantly improved, by CT criteria.  He responded well to initial treatment with oxaliplatin  based chemotherapy.  He had neuropathy symptoms from oxaliplatin .  He had psychosis following 5-fluorouracil .  He began a trial of salvage therapy with cisplatin  on 11/26/2024.  He completed cycle 4 on 12/22/2024.  He began palliative radiation to the gastric mass on 12/17/2024.  He is now admitted with intractable nausea/vomiting, potentially related to a small bowel obstruction,  ileus from the carcinomatosis, in addition to persistent tumor in the stomach.   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  There was retained fluid in the stomach and duodenum.  A gastric antral stent was placed.  He continues to have nausea and vomiting of liquids despite placement of the gastric antral stent.  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  The nausea partially improved with octreotide .  His wife reports Phenergan  helps the nausea.    He has intermittent hematemesis, likely secondary  to the gastric mass.  GI does not recommend an endoscopy.  The hemoglobin was lower yesterday.  He received 2 units of packed red blood cells yesterday.  He began palliative radiation to the gastric mass on 12/17/2024 and has completed 5 treatments to date.  Mr Supinski  developed CT evidence of progressive aspiration pneumonia.  He also had pneumomediastinum with subcutaneous emphysema extending to the neck on a CT 12/12/2024.  The significance of the emphysematous findings is unclear.  I reviewed the CT images.  He may have a tumor related microperforation or perforation related to emesis.  Mr Mantz has been evaluated by the palliative care service.  The family has met with the palliative care team. They understand the poor prognosis.  We discussed the prognosis and treatment options this morning.  He confirmed his desire to continue treatment.  We submitted tissue for molecular testing to look for options for targeted treatment and assess the tumor mutation burden.  Results are pending.  Recommendations: Continue TPN Continue Phenergan  and Ativan  as needed for nausea, continue antiacid therapy Continue palliative radiation to the gastric mass Packed red blood cells as needed for severe anemia Follow-up results of NGS testing on the gastric mass to look for additional treatment options Out of bed, ambulate as  tolerated Please call oncology as needed.  I will see him 12/27/2024.   LOS: 26 days   Arley Hof, MD   12/24/2024, 7:45 AM

## 2024-12-25 DIAGNOSIS — K920 Hematemesis: Secondary | ICD-10-CM | POA: Diagnosis not present

## 2024-12-25 LAB — CBC WITH DIFFERENTIAL/PLATELET
Abs Immature Granulocytes: 0.1 K/uL — ABNORMAL HIGH (ref 0.00–0.07)
Basophils Absolute: 0 K/uL (ref 0.0–0.1)
Basophils Relative: 1 %
Eosinophils Absolute: 0 K/uL (ref 0.0–0.5)
Eosinophils Relative: 1 %
HCT: 28.2 % — ABNORMAL LOW (ref 39.0–52.0)
Hemoglobin: 9.1 g/dL — ABNORMAL LOW (ref 13.0–17.0)
Immature Granulocytes: 3 %
Lymphocytes Relative: 8 %
Lymphs Abs: 0.3 K/uL — ABNORMAL LOW (ref 0.7–4.0)
MCH: 29.5 pg (ref 26.0–34.0)
MCHC: 32.3 g/dL (ref 30.0–36.0)
MCV: 91.6 fL (ref 80.0–100.0)
Monocytes Absolute: 0.1 K/uL (ref 0.1–1.0)
Monocytes Relative: 3 %
Neutro Abs: 2.8 K/uL (ref 1.7–7.7)
Neutrophils Relative %: 84 %
Platelets: 249 K/uL (ref 150–400)
RBC: 3.08 MIL/uL — ABNORMAL LOW (ref 4.22–5.81)
RDW: 15.9 % — ABNORMAL HIGH (ref 11.5–15.5)
WBC: 3.3 K/uL — ABNORMAL LOW (ref 4.0–10.5)
nRBC: 1.8 % — ABNORMAL HIGH (ref 0.0–0.2)

## 2024-12-25 LAB — GLUCOSE, CAPILLARY
Glucose-Capillary: 124 mg/dL — ABNORMAL HIGH (ref 70–99)
Glucose-Capillary: 134 mg/dL — ABNORMAL HIGH (ref 70–99)
Glucose-Capillary: 138 mg/dL — ABNORMAL HIGH (ref 70–99)
Glucose-Capillary: 138 mg/dL — ABNORMAL HIGH (ref 70–99)
Glucose-Capillary: 141 mg/dL — ABNORMAL HIGH (ref 70–99)
Glucose-Capillary: 150 mg/dL — ABNORMAL HIGH (ref 70–99)
Glucose-Capillary: 97 mg/dL (ref 70–99)

## 2024-12-25 LAB — BASIC METABOLIC PANEL WITH GFR
Anion gap: 11 (ref 5–15)
BUN: 41 mg/dL — ABNORMAL HIGH (ref 6–20)
CO2: 22 mmol/L (ref 22–32)
Calcium: 8 mg/dL — ABNORMAL LOW (ref 8.9–10.3)
Chloride: 110 mmol/L (ref 98–111)
Creatinine, Ser: 0.69 mg/dL (ref 0.61–1.24)
GFR, Estimated: >60 mL/min
Glucose, Bld: 140 mg/dL — ABNORMAL HIGH (ref 70–99)
Potassium: 3.8 mmol/L (ref 3.5–5.1)
Sodium: 143 mmol/L (ref 135–145)

## 2024-12-25 LAB — PHOSPHORUS: Phosphorus: 2.8 mg/dL (ref 2.5–4.6)

## 2024-12-25 LAB — MAGNESIUM: Magnesium: 2.4 mg/dL (ref 1.7–2.4)

## 2024-12-25 MED ORDER — TRAVASOL 10 % IV SOLN
INTRAVENOUS | Status: AC
  Filled 2024-12-25: qty 1188

## 2024-12-25 NOTE — Progress Notes (Signed)
 PHARMACY - TOTAL PARENTERAL NUTRITION CONSULT NOTE   Indication: intolerance to enteral feeds  Patient Measurements: Height: 6' (182.9 cm) Weight:  (pt refused standing weight, the bed weight would not be accurate) IBW/kg (Calculated) : 77.6 TPN AdjBW (KG): 70 Body mass index is 20.25 kg/m.  Assessment: 37 yoM admitted on 12/13 with intractable nausea and vomiting, dilated small bowel loops, and coffee-ground emesis. 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  He underwent EGD 12/16 showing esophagitis, gastric stensosis, stent placed, impaired peristalsis. Pharmacy was consulted to dose TPN on 12/20 for intolerance to enteral feeding.    Glucose / Insulin : No hx DM, no PTA meds. A1c 5.7% on 12/14 -Dexamethasone  10 mg 1/7 with chemo -Octreotide  infusion resumed 12/25 at 50 mcg/hr (noted that octreotide , especially at higher doses/frequencies, can cause glucose dysregulation), discontinued 1/8 pm - BG Range: 119-141 (Goal 140-180), continue to monitor as TPN is cycled.  Max GIR 5.67 mg/kg/min - Used 4 units SSI/24 hrs Electrolytes: WNL, including CorrCa (9.12), Na up to 143 - Cl up to 110, CO2 down to 22.   Renal: SCr < 1, BUN elevated to 41 Hepatic: WNL, alk phos minimally elevated (12/23/24) -Trig trending down, WNL at 99 -Alb low at 2.6, but overall stable Intake / Output:  -UOP undocumented -Continues to have emesis, appeared to be decreasing however ~ 2.3 L documented 1/9 -LBM 1/9 GI Imaging: -12/18 Abd xray: No bowel dilatation or evidence of obstruction. Retained gastric fluid. 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 Prosthesis placed. Duodenal dilation deformity  -12/25 DG abd: nonobstructive bowel gas pattern -12/27 DG abd: nonobstructive bowel gas pattern -12/28 chest/abd CT: Esophageal distention. Nonspecific Enteritis. New mildly enlarged right hilar lymph node and a few slightly prominent bilateral hilar lymph nodes. New  small volume of ascites  GI Surgeries / Procedures:  -12/16 EGD: esophagitis  GI meds:   - Protonix  80 mg IV q12h (increased 1/8) - Ondansetron  8mg  IV q8h - Sucralfate  1g PO TID  Central access: Implanted Port (01/22/24) TPN start date: 12/20   Nutritional Goals: Cyclic TPN, total volume 2160 mL, provides 119 g of protein and 2298 kcals per day  RD Assessment: Estimated Needs Total Energy Estimated Needs: 2100-2450 kcals Total Protein Estimated Needs: 105-120 grams Total Fluid Estimated Needs: >/= 2.1L  Current Nutrition:  Clear liquids (1/9) TPN  Plan:  Continue cyclic TPN, decrease infusion time to 16 hours  Electrolytes in TPN: Na 75 mEq/L K 40 mEq/L Ca 2 mEq/L Mg 5 mEq/L Phos 20 mmol/L Cl:Ac  to 1:1 Add standard MVI and trace elements to TPN Continue mSSI - will adjust schedule to 2 hrs post TPN start, halfway through cycle, 1 hr post TPN stop and about halfway between TPN stop/start Monitor TPN labs on Mon/Thurs and PRN.     Wanda Hasting PharmD, BCPS [facility] main pharmacy [phone] 12/25/2024 8:39 AM

## 2024-12-25 NOTE — Plan of Care (Signed)

## 2024-12-25 NOTE — Progress Notes (Signed)
 " PROGRESS NOTE Henry May  FMW:[id number] DOB: 29-Jun-1966 DOA: 11/27/2024 PCP: [name], DO  Brief Narrative/Hospital Course: Henry May is a 59 y.o. male with PMH of presented to [facility] due to nausea, vomiting and coffee-ground emesis 2 days after he started new chemotherapy, and admitted for intractable nausea and vomiting, dilated small bowel loops and coffee-ground emesis.Recent EGD on 12/4 with LA grade D esophagitis without bleeding, and large infiltrative, sessile and ulcerated circumferential mass with contact of bleeding in gastric body and antrum with antral narrowing and luminal diameter of approximately 1.5 cm   In ED,  vss stable.CBC and CMP without significant finding.  Lipase 171. CT abdomen and pelvis>>dilated proximal small bowel suggesting SBO but without discrete transition point, thick walled gastric body/antrum without definite focal mass, mild nodularity along the pylorus, scarring with superimposed patchy peribronchovascular nodularity in the bilateral lower lobes right more than left suggesting mild aspiration or pneumonia.   GI, surgery oncology consulted EGD>> s/p gastric stent placement on 12/16.Was unable to tolerate diet.  Started on TPN.Plan was for discharge with TPN once logistic in place but nausea and vomiting gotten worse after stopping octreotide  and Decadron  and having hemoptysis with drop in hemoglobin and restarted on octreotide  infusion.   CT chest, abdomen and pelvis : showed new patchy substernal pneumomediastinum of indeterminate source, patchy consolidation in lower lobes and posterior LUL, esophageal distention with retained/reflux fluid in thoracic inlet, emphysematous cystitis and circumferential gastric thickening with adjacent stranding change, antropyloric stenting and continued duodenal dilation, thickening jejunal folds.  Overall with ongoing bleeding felt secondary to metastatic gastric carcinoma Oncology radiation following and on  chemotherapy along with XRT inhouse-to decrease tumor burden and provide symptomatic relief.  Subjective: Seen and examined He still having vomiting with coffee-ground emesis and blood alternatingly. Pain is stable. No new complaints wife at the bedside Overnight remains afebrile although low-grade temp 100.5 yesterday 4 AM Blood sugar stable CBC with fairly stable hemoglobin 9.1, mild leukopenia stable platelet count stable renal function  Assessment and plan:  Gastric cancer-f/b Dr. 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 On  weekly Cisplatin  (got on 1/7), on  daily XRT planning for 10 XRT (1/2, 1/5>>) to decrease tumor burden, obstruction and bleeding.  Octreotide  infusion restarted 12/26>>, Cont  PN-pharmacy managing Patient completed Unasyn  1/2, s/p multiple PRBC  again had 2 more units PRBC 1/8-repeat H&H improved Patient having ongoing intermittent hematemesis, needing PRBC support-Hb stable at this time. Now on scheduled Zofran  1/8> ( but not taking). Ferric gluconate 250 mg x 4 doses  ordered 1/6>> Continue SSI, PPI twice daily, Carafate  1 g tid, Antiemetics, folic acid   Continue aspiration precaution, supportive care, transfusion to keep hemoglobin above 8. Palliative following As per oncology--follow-up results of NGS testing on the gastric mass to look for additional treatment options   x-ray abd 1/8-nonobstructive and no more nausea-advance diet as tolerated -wants to continue clear liquid diet for now Recent Labs  Lab 12/22/24 0409 12/23/24 0440 12/23/24 2232 12/24/24 0424 12/25/24 0419  HGB 8.3* 7.3* 10.0* 10.3* 9.1*  HCT 25.5* 23.0* 30.0* 30.1* 28.2*    Aspiration pneumonitis/pneumonia Pneumomediastinum noted on the CT  chest: Pneumomediastinum likely from retching and vomiting.Patient completed Unasyn  1/2 cont aspiration precaution.  Leukopenia: Stable monitor closely while on chemo   Diabetes-diet controlled Hemoglobin A1c 5.7% (  11/28/2024). Cbg ok on ssi while on TPN   History of DVT Status post IVC filter.     Elevated lipase Improved.   Insomnia mainly due to intractable nausea and vomiting Cont IV Ativan  and Phenergan . zofran  as above.  Lightheadedness/presyncope Deconditioning: Likely secondary to intractable nausea vomiting hematemesis and volume depletion.  Severe malnutrition On TPN as above. Body mass index is 20.25 kg/m.   Mobility: PT Orders:  PT Follow up Rec: No Pt Follow Up1/05/2025 1434   DVT prophylaxis: Place and maintain sequential compression device Start: 12/15/24 1907 SCDs Start: 11/28/24 1042 no chemical prophylaxis due to hematemesis/GI bleeding Code Status:   Code Status: Limited: Do not attempt resuscitation (DNR) -DNR-LIMITED -Do Not Intubate/DNI  Family Communication: plan of care discussed with patient and family at bedside. Patient status is: Remains hospitalized because of severity of illness Level of care: Progressive   Dispo: The patient is from: home            Anticipated disposition: TBD Objective: Vitals last 24 hrs: Vitals:   12/24/24 2355 12/25/24 0421 12/25/24 0722 12/25/24 1111  BP: (!) 95/54 102/62 106/60 110/75  Pulse: 88 (!) 105 (!) 103 (!) 102  Resp: (!) 24 (!) 32 (!) 26 (!) 28  Temp: 97.7 F (36.5 C) 98.3 F (36.8 C) 98 F (36.7 C) 97.6 F (36.4 C)  TempSrc: Oral Oral Oral Oral  SpO2: 94% 95% 98% 100%  Weight:      Height:       Physical Examination: General exam: AAOX3, thin, cachectic.  Pleasant HEENT:Oral mucosa moist, Ear/Nose WNL grossly Respiratory system: Bilaterally clear NAD Cardiovascular system: S1 & S2 +, No JVD. Gastrointestinal system: Abdomen soft  mildly tender diffusely,ND, BS+ Nervous System: Alert, awake,  moving all extremities,and following commands. Extremities: extremities warm, leg edema mild Skin: Warm, no rashes MSK: thin muscle bulk,tone, power.   Medications reviewed:  Scheduled Meds:  Chlorhexidine  Gluconate Cloth  6 each Topical Daily   insulin  aspart  0-15 Units Subcutaneous 4 times per day   mouth rinse  15 mL Mouth Rinse 4 times per day   pantoprazole  (PROTONIX ) IV  80 mg Intravenous Q12H   sucralfate   1 g Oral TID   Continuous Infusions:  ondansetron  Stopped (12/24/24 0529)   promethazine  (PHENERGAN ) injection (IM or IVPB) 25 mg (12/25/24 1133)   TPN CYCLIC-ADULT (ION) 64 mL/hr at 12/25/24 1103   TPN CYCLIC-ADULT (ION)     Diet: Diet Order             Diet clear liquid Room service appropriate? Yes; Fluid consistency: Thin  Diet effective now                  Data Reviewed: I have personally reviewed following labs and imaging studies ( see epic result tab) CBC: Recent Labs  Lab 12/21/24 0416 12/22/24 0409 12/23/24 0440 12/23/24 2232 12/24/24 0424 12/25/24 0419  WBC 2.6* 3.4* 3.1*  --  3.2* 3.3*  NEUTROABS 1.9 2.4 2.2  --  2.6 2.8  HGB 8.1* 8.3* 7.3* 10.0* 10.3* 9.1*  HCT 24.3* 25.5* 23.0* 30.0* 30.1* 28.2*  MCV 89.7 91.4 92.0  --  89.6 91.6  PLT 245 291 284  --  270 249   CMP: Recent Labs  Lab 12/20/24 0351 12/21/24 0416 12/22/24 0409 12/23/24 0440 12/25/24 0419  NA 138 138 137 136 143  K 3.8 4.1 4.4 4.5 3.8  CL 104 105 103 104 110  CO2 26 27 26 24  22  GLUCOSE 125* 134* 121* 139* 140*  BUN 29* 26* 24* 21* 41*  CREATININE 0.59* 0.52* 0.56* 0.50* 0.69  CALCIUM  7.8* 7.7* 8.1* 8.1* 8.0*  MG 2.1  --   --  2.5* 2.4  PHOS 2.5  --  3.1 2.9 2.8   GFR: Estimated Creatinine Clearance: 94 mL/min (by C-G formula based on SCr of 0.69 mg/dL). Recent Labs  Lab 12/20/24 0351 12/22/24 0409 12/23/24 0440  AST 17  --  17  ALT 34  --  32  ALKPHOS 134*  --  145*  BILITOT 0.7  --  0.4  PROT 5.2*  --  5.7*  ALBUMIN  2.3* 2.5* 2.6*   No results for  input(s): LIPASE, AMYLASE in the last 168 hours. No results for input(s): AMMONIA in the last 168 hours. Coagulation Profile: No results for input(s): INR, PROTIME in the last 168 hours. Unresulted Labs (From admission, onward)     Start     Ordered   12/26/24 0500  Basic metabolic panel with GFR  Tomorrow morning,   R       Question:  Specimen collection method  Answer:  Lab=Lab collect   12/25/24 0959   12/16/24 0500  Phosphorus  (Standard TPN Labs (all labs to be drawn at 0500))  Every Mon,Thu (0500),   R     Question:  Specimen collection method  Answer:  IV Team=IV Team collect   12/15/24 0741   12/06/24 0500  Comprehensive metabolic panel  (TPN Lab Panel)  Every Mon,Thu (0500),   R     Question:  Specimen collection method  Answer:  IV Team=IV Team collect   12/04/24 1026   12/06/24 0500  Magnesium   (TPN Lab Panel)  Every Mon,Thu (0500),   R     Question:  Specimen collection method  Answer:  IV Team=IV Team collect   12/04/24 1026   12/06/24 0500  Triglycerides  (TPN Lab Panel)  Every Monday (0500),   R     Question:  Specimen collection method  Answer:  IV Team=IV Team collect   12/04/24 1026           Antimicrobials/Microbiology: Anti-infectives (From admission, onward)    Start     Dose/Rate Route Frequency Ordered Stop   12/11/24 0900  Ampicillin -Sulbactam (UNASYN ) 3 g in sodium chloride  0.9 % 100 mL IVPB        3 g 200 mL/hr over 30 Minutes Intravenous Every 6 hours 12/11/24 0817 12/18/24 0950   11/28/24 1200  Ampicillin -Sulbactam (UNASYN ) 3 g in sodium chloride  0.9 % 100 mL IVPB  Status:  Discontinued        3 g 200 mL/hr over 30 Minutes Intravenous Every 6 hours 11/28/24 1048 12/05/24 0909   11/28/24 0045  Ampicillin -Sulbactam (UNASYN ) 3 g in sodium chloride  0.9 % 100 mL IVPB        3 g 200 mL/hr over 30 Minutes Intravenous  Once 11/28/24 0042 11/28/24 0134         Component Value Date/Time   SDES  02/05/2024 1904    BLOOD BLOOD RIGHT  ARM Performed at [facility], [address] [place], [place] [address]    [facility]  02/05/2024 1904    Blood Culture adequate volume BOTTLES DRAWN AEROBIC AND ANAEROBIC Performed at [facility], [address] [place], [place] [address]    CULT  02/05/2024 1904    NO GROWTH 5 DAYS Performed at [facility], [address], [place],  [place] [address]    REPTSTATUS 02/10/2024 FINAL 02/05/2024 1904    Procedures: Procedures (LRB): EGD (ESOPHAGOGASTRODUODENOSCOPY) (N/A) INSERTION, STENT, DUODENUM (N/A)   Mennie LAMY, MD [facility] 12/25/2024, 11:48 AM   "

## 2024-12-26 DIAGNOSIS — K920 Hematemesis: Secondary | ICD-10-CM | POA: Diagnosis not present

## 2024-12-26 LAB — BASIC METABOLIC PANEL WITH GFR
Anion gap: 10 (ref 5–15)
BUN: 41 mg/dL — ABNORMAL HIGH (ref 6–20)
CO2: 23 mmol/L (ref 22–32)
Calcium: 8.3 mg/dL — ABNORMAL LOW (ref 8.9–10.3)
Chloride: 110 mmol/L (ref 98–111)
Creatinine, Ser: 0.69 mg/dL (ref 0.61–1.24)
GFR, Estimated: >60 mL/min
Glucose, Bld: 125 mg/dL — ABNORMAL HIGH (ref 70–99)
Potassium: 3.8 mmol/L (ref 3.5–5.1)
Sodium: 143 mmol/L (ref 135–145)

## 2024-12-26 LAB — GLUCOSE, CAPILLARY
Glucose-Capillary: 138 mg/dL — ABNORMAL HIGH (ref 70–99)
Glucose-Capillary: 156 mg/dL — ABNORMAL HIGH (ref 70–99)
Glucose-Capillary: 100 mg/dL — ABNORMAL HIGH (ref 70–99)
Glucose-Capillary: 100 mg/dL — ABNORMAL HIGH (ref 70–99)
Glucose-Capillary: 144 mg/dL — ABNORMAL HIGH (ref 70–99)
Glucose-Capillary: 116 mg/dL — ABNORMAL HIGH (ref 70–99)

## 2024-12-26 MED ORDER — TRAVASOL 10 % IV SOLN
INTRAVENOUS | Status: AC
  Filled 2024-12-26: qty 1188

## 2024-12-26 NOTE — Progress Notes (Signed)
 " PROGRESS NOTE Henry May  FMW:[id number] DOB: 1966-09-30 DOA: 11/27/2024 PCP: [name], DO  Brief Narrative/Hospital Course: Henry May is a 59 y.o. male with PMH of presented to [facility] due to nausea, vomiting and coffee-ground emesis 2 days after he started new chemotherapy, and admitted for intractable nausea and vomiting, dilated small bowel loops and coffee-ground emesis.Recent EGD on 12/4 with LA grade D esophagitis without bleeding, and large infiltrative, sessile and ulcerated circumferential mass with contact of bleeding in gastric body and antrum with antral narrowing and luminal diameter of approximately 1.5 cm   In ED,  vss stable.CBC and CMP without significant finding.  Lipase 171. CT abdomen and pelvis>>dilated proximal small bowel suggesting SBO but without discrete transition point, thick walled gastric body/antrum without definite focal mass, mild nodularity along the pylorus, scarring with superimposed patchy peribronchovascular nodularity in the bilateral lower lobes right more than left suggesting mild aspiration or pneumonia.   GI, surgery oncology consulted EGD>> s/p gastric stent placement on 12/16.Was unable to tolerate diet.  Started on TPN.Plan was for discharge with TPN once logistic in place but nausea and vomiting gotten worse after stopping octreotide  and Decadron  and having hemoptysis with drop in hemoglobin and restarted on octreotide  infusion.   CT chest, abdomen and pelvis : showed new patchy substernal pneumomediastinum of indeterminate source, patchy consolidation in lower lobes and posterior LUL, esophageal distention with retained/reflux fluid in thoracic inlet, emphysematous cystitis and circumferential gastric thickening with adjacent stranding change, antropyloric stenting and continued duodenal dilation, thickening jejunal folds.  Overall with ongoing bleeding felt secondary to metastatic gastric carcinoma Oncology radiation following and on  chemotherapy along with XRT inhouse-to decrease tumor burden and provide symptomatic relief.  Subjective: Seen and examined Family at the bedside.  No new complaints Eager to try full liquid diet today Vital stable afebrile on nasal cannula.  Electrolytes stable this morning   Assessment and plan:  Gastric cancer-f/b Dr. 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 On  weekly Cisplatin  ( from 1/7), on  daily XRT planning for 10 XRT (1/2, 1/5>>) to decrease tumor burden, obstruction and bleeding.  Octreotide  infusion restarted 12/26>> per Oncology, Cont TPN-pharmacy managing Patient completed Unasyn  1/2, s/p multiple PRBC  again had 2 more units PRBC 1/8-repeat H&H improved- trend. Patient having ongoing intermittent hematemesis Ferric gluconate 250 mg x 4 doses  ordered 1/6>> Continue SSI, PPI twice daily, Carafate  1 g tid, Antiemetics, folic acid   Continue aspiration precaution, supportive care, transfusion to keep hemoglobin above 8. Palliative following As per oncology--follow-up results of NGS testing on the gastric mass to look for additional treatment options   x-ray abd 1/8-nonobstructive and no more nausea-advance diet as tolerated   Recent Labs  Lab 12/22/24 0409 12/23/24 0440 12/23/24 2232 12/24/24 0424 12/25/24 0419  HGB 8.3* 7.3* 10.0* 10.3* 9.1*  HCT 25.5* 23.0* 30.0* 30.1* 28.2*    Aspiration pneumonitis/pneumonia Pneumomediastinum noted on the CT chest: Pneumomediastinum likely from retching and vomiting.Patient completed Unasyn  1/2 cont aspiration precaution.  Leukopenia: Stable monitor closely while on chemo   Diabetes-diet controlled Hemoglobin A1c 5.7% (11/28/2024). Cbg ok on ssi while on TPN   History of DVT Status post  IVC filter.     Elevated lipase Improved.   Insomnia mainly due to intractable nausea and vomiting Cont IV Ativan  and Phenergan .  Lightheadedness/presyncope Deconditioning: Likely secondary to intractable nausea vomiting hematemesis and volume depletion.resolved. ambulating.  Severe malnutrition On TPN as above. Body mass index is 20.25 kg/m.   Mobility: PT Orders:  PT Follow up Rec: No Pt Follow Up1/05/2025 1434   DVT prophylaxis: Place and maintain sequential compression device Start: 12/15/24 1907 SCDs Start: 11/28/24 1042 no chemical prophylaxis due to hematemesis/GI bleeding Code Status:   Code Status: Limited: Do not attempt resuscitation (DNR) -DNR-LIMITED -Do Not Intubate/DNI  Family Communication: plan of care discussed with patient and family at bedside. Patient status is: Remains hospitalized because of severity of illness Level of care: Progressive   Dispo: The patient is from: home            Anticipated disposition: Disposition as per oncology after completion of chemo and radiation Objective: Vitals last 24 hrs: Vitals:   12/25/24 1111 12/25/24 1636 12/25/24 2108 12/26/24 0403  BP: 110/75 117/62 103/65 117/66  Pulse: (!) 102 83 84 100  Resp: (!) 28 (!) 25 20 (!) 25  Temp: 97.6 F (36.4 C) 98.3 F (36.8 C) 97.7 F (36.5 C) 98 F (36.7 C)  TempSrc: Oral Oral Oral Oral  SpO2: 100% 95% 99% 96%  Weight:      Height:       Physical Examination: General exam: AAOX3 thin and frail HEENT:Oral mucosa moist, Ear/Nose WNL grossly Respiratory system: Bilaterally clear NAD Cardiovascular system: S1 & S2 +, No JVD. Gastrointestinal system: Abdomen soft  mildly tender diffusely,ND, BS+ Nervous System: Alert, awake, moving all extremities,and following commands. Extremities: extremities warm, leg edema mild Skin: Warm, no rashes MSK: thin muscle bulk,tone, power.   Medications reviewed:  Scheduled Meds:  Chlorhexidine  Gluconate Cloth  6 each Topical Daily    insulin  aspart  0-15 Units Subcutaneous 4 times per day   mouth rinse  15 mL Mouth Rinse 4 times per day   pantoprazole  (PROTONIX ) IV  80 mg Intravenous Q12H   sucralfate   1 g Oral TID   Continuous Infusions:  promethazine  (PHENERGAN ) injection (IM or IVPB) 25 mg (12/26/24 1152)   TPN CYCLIC-ADULT (ION)     Diet: Diet Order             Diet full liquid Room service appropriate? Yes; Fluid consistency: Thin  Diet effective now                  Data Reviewed: I have personally reviewed following labs and imaging studies ( see epic result tab) CBC: Recent Labs  Lab 12/21/24 0416 12/22/24 0409 12/23/24 0440 12/23/24 2232 12/24/24 0424 12/25/24 0419  WBC 2.6* 3.4* 3.1*  --  3.2* 3.3*  NEUTROABS 1.9 2.4 2.2  --  2.6 2.8  HGB 8.1* 8.3* 7.3* 10.0* 10.3* 9.1*  HCT 24.3* 25.5* 23.0* 30.0* 30.1* 28.2*  MCV 89.7 91.4 92.0  --  89.6 91.6  PLT 245 291 284  --  270 249   CMP: Recent Labs  Lab 12/20/24 0351 12/21/24 0416 12/22/24 0409 12/23/24 0440 12/25/24 0419 12/26/24 0352  NA 138 138 137 136 143 143  K 3.8 4.1 4.4 4.5 3.8 3.8  CL 104 105 103 104 110 110  CO2 26 27 26 24 22 23   GLUCOSE 125* 134* 121* 139* 140* 125*  BUN 29* 26* 24* 21* 41* 41*  CREATININE 0.59* 0.52* 0.56* 0.50* 0.69 0.69  CALCIUM  7.8* 7.7* 8.1* 8.1* 8.0* 8.3*  MG 2.1  --   --  2.5* 2.4  --   PHOS 2.5  --  3.1 2.9 2.8  --    GFR: Estimated Creatinine Clearance: 94 mL/min (by C-G formula based on SCr of 0.69 mg/dL). Recent Labs  Lab 12/20/24 0351 12/22/24 0409 12/23/24 0440  AST 17  --  17  ALT 34  --  32  ALKPHOS 134*  --  145*  BILITOT 0.7  --  0.4  PROT 5.2*  --  5.7*  ALBUMIN  2.3* 2.5* 2.6*   No results for input(s): LIPASE, AMYLASE in the last 168 hours. No results for input(s): AMMONIA in the last 168 hours. Coagulation Profile: No results for input(s): INR, PROTIME in the last 168 hours. Unresulted Labs (From admission, onward)     Start     Ordered   12/27/24 0500  Basic  metabolic panel with GFR  Daily,   R     Question:  Specimen collection method  Answer:  Lab=Lab collect   12/26/24 1007   12/27/24 0500  CBC  Daily,   R     Question:  Specimen collection method  Answer:  Lab=Lab collect   12/26/24 1007   12/16/24 0500  Phosphorus  (Standard TPN Labs (all labs to be drawn at 0500))  Every Mon,Thu (0500),   R     Question:  Specimen collection method  Answer:  IV Team=IV Team collect   12/15/24 0741   12/06/24 0500  Comprehensive metabolic panel  (TPN Lab Panel)  Every Mon,Thu (0500),   R     Question:  Specimen collection method  Answer:  IV Team=IV Team collect   12/04/24 1026   12/06/24 0500  Magnesium   (TPN Lab Panel)  Every Mon,Thu (0500),   R     Question:  Specimen collection method  Answer:  IV Team=IV Team collect   12/04/24 1026   12/06/24 0500  Triglycerides  (TPN Lab Panel)  Every Monday (0500),   R     Question:  Specimen collection method  Answer:  IV Team=IV Team collect   12/04/24 1026           Antimicrobials/Microbiology: Anti-infectives (From admission, onward)    Start     Dose/Rate Route Frequency Ordered Stop   12/11/24 0900  Ampicillin -Sulbactam (UNASYN ) 3 g in sodium chloride  0.9 % 100 mL IVPB        3 g 200 mL/hr over 30 Minutes Intravenous Every 6 hours 12/11/24 0817 12/18/24 0950   11/28/24 1200  Ampicillin -Sulbactam (UNASYN ) 3 g in sodium chloride  0.9 % 100 mL IVPB  Status:  Discontinued        3 g 200 mL/hr over 30 Minutes Intravenous Every 6 hours 11/28/24 1048 12/05/24 0909   11/28/24 0045  Ampicillin -Sulbactam (UNASYN ) 3 g in sodium chloride  0.9 % 100 mL IVPB        3 g 200 mL/hr over 30 Minutes Intravenous  Once 11/28/24 0042 11/28/24 0134         Component Value Date/Time   SDES  02/05/2024 1904    BLOOD BLOOD RIGHT ARM Performed at [facility], [address], [place], [place] [address]    [facility]  02/05/2024 1904    Blood Culture adequate volume BOTTLES DRAWN AEROBIC AND  ANAEROBIC Performed at [facility], [address], [place], [place] [address]    CULT  02/05/2024 1904    NO GROWTH 5 DAYS Performed at [facility], [address], [place], [place] [address]    REPTSTATUS 02/10/2024 FINAL 02/05/2024 1904    Procedures: Procedures (  LRB): EGD (ESOPHAGOGASTRODUODENOSCOPY) (N/A) INSERTION, STENT, DUODENUM (N/A)   Mennie LAMY, MD [facility] 12/26/2024, 11:52 AM   "

## 2024-12-26 NOTE — Plan of Care (Signed)

## 2024-12-26 NOTE — Plan of Care (Signed)
   Problem: Coping: Goal: Level of anxiety will decrease Outcome: Progressing   Problem: Pain Managment: Goal: General experience of comfort will improve and/or be controlled Outcome: Progressing   Problem: Safety: Goal: Ability to remain free from injury will improve Outcome: Progressing

## 2024-12-26 NOTE — Progress Notes (Signed)
 PHARMACY - TOTAL PARENTERAL NUTRITION CONSULT NOTE   Indication: intolerance to enteral feeds  Patient Measurements: Height: 6' (182.9 cm) Weight:  (unable to get bed or standing weight) IBW/kg (Calculated) : 77.6 TPN AdjBW (KG): 70 Body mass index is 20.25 kg/m.  Assessment: 89 yoM admitted on 12/13 with intractable nausea and vomiting, dilated small bowel loops, and coffee-ground emesis. 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  He underwent EGD 12/16 showing esophagitis, gastric stensosis, stent placed, impaired peristalsis. Pharmacy was consulted to dose TPN on 12/20 for intolerance to enteral feeding.    Glucose / Insulin : No hx DM, no PTA meds. A1c 5.7% on 12/14 -Dexamethasone  10 mg 1/7 with chemo -Octreotide  infusion (12/25-1/8) can cause glucose dysregulation.  - CBG range (goal 140-180):  Low at 97 while TPN off, max is 156 while TPN is at max rate.  CBGs relatively stable on cyclic TPN.   - SSI/24 hrs:  6 units Electrolytes: WNL, including CorrCa (9.42) Renal: SCr < 1, BUN elevated to 41 Hepatic: WNL, alk phos minimally elevated (12/23/24) -Trig trending down, WNL at 99 -Alb low at 2.6, but overall stable Intake / Output:  -UOP undocumented volume, x2 occurrence -Emesis undocumented 1/10 -LBM 1/9 GI Imaging: -12/18 Abd xray: No bowel dilatation or evidence of obstruction. Retained gastric fluid. 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 Prosthesis placed. Duodenal dilation deformity  -12/25 DG abd: nonobstructive bowel gas pattern -12/27 DG abd: nonobstructive bowel gas pattern -12/28 chest/abd CT: Esophageal distention. Nonspecific Enteritis. New mildly enlarged right hilar lymph node and a few slightly prominent bilateral hilar lymph nodes. New small volume of ascites  GI Surgeries / Procedures:  -12/16 EGD: esophagitis  GI meds:   - Protonix  80 mg IV q12h (increased 1/8) - Sucralfate  1g PO TID  Central access:  Implanted Port (01/22/24) TPN start date: 12/20   Nutritional Goals: Cyclic TPN, total volume 2160 mL, provides 119 g of protein and 2298 kcals per day  RD Assessment: Estimated Needs Total Energy Estimated Needs: 2100-2450 kcals Total Protein Estimated Needs: 105-120 grams Total Fluid Estimated Needs: >/= 2.1L  Current Nutrition:  Clear liquids (1/9) TPN  Plan:  Continue cyclic TPN over 16 hours  Electrolytes in TPN: Na 75 mEq/L K 40 mEq/L Ca 2 mEq/L Mg 5 mEq/L Phos 20 mmol/L Cl:Ac  to 1:1 Add standard MVI and trace elements to TPN Continue mSSI - scheduled 2 hrs post TPN start, halfway through cycle, 1 hr post TPN stop and about halfway between TPN stop/start Monitor TPN labs on Mon/Thurs and PRN.     Wanda Hasting PharmD, BCPS [facility] main pharmacy [phone] 12/26/2024 9:02 AM

## 2024-12-27 ENCOUNTER — Ambulatory Visit: Payer: Self-pay

## 2024-12-27 ENCOUNTER — Other Ambulatory Visit: Payer: Self-pay

## 2024-12-27 DIAGNOSIS — K920 Hematemesis: Secondary | ICD-10-CM | POA: Diagnosis not present

## 2024-12-27 LAB — COMPREHENSIVE METABOLIC PANEL WITH GFR
ALT: 25 U/L (ref 0–44)
AST: 21 U/L (ref 15–41)
Albumin: 2.6 g/dL — ABNORMAL LOW (ref 3.5–5.0)
Alkaline Phosphatase: 141 U/L — ABNORMAL HIGH (ref 38–126)
Anion gap: 11 (ref 5–15)
BUN: 37 mg/dL — ABNORMAL HIGH (ref 6–20)
CO2: 23 mmol/L (ref 22–32)
Calcium: 8.6 mg/dL — ABNORMAL LOW (ref 8.9–10.3)
Chloride: 110 mmol/L (ref 98–111)
Creatinine, Ser: 0.68 mg/dL (ref 0.61–1.24)
GFR, Estimated: >60 mL/min
Glucose, Bld: 144 mg/dL — ABNORMAL HIGH (ref 70–99)
Potassium: 3.9 mmol/L (ref 3.5–5.1)
Sodium: 143 mmol/L (ref 135–145)
Total Bilirubin: 0.4 mg/dL (ref 0.0–1.2)
Total Protein: 6 g/dL — ABNORMAL LOW (ref 6.5–8.1)

## 2024-12-27 LAB — RAD ONC ARIA SESSION SUMMARY
Course Elapsed Days: 10
Plan Fractions Treated to Date: 6
Plan Prescribed Dose Per Fraction: 3 Gy
Plan Total Fractions Prescribed: 9
Plan Total Prescribed Dose: 27 Gy
Reference Point Dosage Given to Date: 18 Gy
Reference Point Session Dosage Given: 3 Gy
Session Number: 7

## 2024-12-27 LAB — CBC
HCT: 28.4 % — ABNORMAL LOW (ref 39.0–52.0)
Hemoglobin: 9 g/dL — ABNORMAL LOW (ref 13.0–17.0)
MCH: 30.3 pg (ref 26.0–34.0)
MCHC: 31.7 g/dL (ref 30.0–36.0)
MCV: 95.6 fL (ref 80.0–100.0)
Platelets: 264 K/uL (ref 150–400)
RBC: 2.97 MIL/uL — ABNORMAL LOW (ref 4.22–5.81)
RDW: 15.9 % — ABNORMAL HIGH (ref 11.5–15.5)
WBC: 2.6 K/uL — ABNORMAL LOW (ref 4.0–10.5)
nRBC: 1.2 % — ABNORMAL HIGH (ref 0.0–0.2)

## 2024-12-27 LAB — PHOSPHORUS: Phosphorus: 3.1 mg/dL (ref 2.5–4.6)

## 2024-12-27 LAB — GLUCOSE, CAPILLARY
Glucose-Capillary: 146 mg/dL — ABNORMAL HIGH (ref 70–99)
Glucose-Capillary: 93 mg/dL (ref 70–99)
Glucose-Capillary: 168 mg/dL — ABNORMAL HIGH (ref 70–99)
Glucose-Capillary: 219 mg/dL — ABNORMAL HIGH (ref 70–99)

## 2024-12-27 LAB — MAGNESIUM: Magnesium: 2.4 mg/dL (ref 1.7–2.4)

## 2024-12-27 LAB — TRIGLYCERIDES: Triglycerides: 124 mg/dL

## 2024-12-27 MED ORDER — TRAVASOL 10 % IV SOLN
INTRAVENOUS | Status: AC
  Filled 2024-12-27: qty 1188

## 2024-12-27 MED ORDER — INSULIN ASPART 100 UNIT/ML IJ SOLN
0.0000 [IU] | INTRAMUSCULAR | Status: DC
  Administered 2024-12-27: 5 [IU] via SUBCUTANEOUS
  Filled 2024-12-27: qty 5

## 2024-12-27 MED ORDER — HYDROMORPHONE HCL 1 MG/ML IJ SOLN
0.5000 mg | INTRAMUSCULAR | Status: DC | PRN
  Administered 2024-12-27 – 2025-01-03 (×3): 0.5 mg via INTRAVENOUS
  Filled 2024-12-27 (×3): qty 0.5

## 2024-12-27 MED ORDER — OCTREOTIDE ACETATE 100 MCG/ML IJ SOLN
100.0000 ug | Freq: Two times a day (BID) | INTRAMUSCULAR | Status: DC
  Administered 2024-12-27 – 2025-01-04 (×17): 100 ug via SUBCUTANEOUS
  Filled 2024-12-27 (×10): qty 1
  Filled 2024-12-27: qty 2
  Filled 2024-12-27 (×7): qty 1

## 2024-12-27 NOTE — Plan of Care (Signed)
" °  Problem: Education: Goal: Knowledge of General Education information will improve Description: Including pain rating scale, medication(s)/side effects and non-pharmacologic comfort measures Outcome: Progressing   Problem: Clinical Measurements: Goal: Will remain free from infection Outcome: Progressing   Problem: Activity: Goal: Risk for activity intolerance will decrease Outcome: Progressing   Problem: Coping: Goal: Level of anxiety will decrease Outcome: Progressing   Problem: Elimination: Goal: Will not experience complications related to bowel motility Outcome: Progressing   Problem: Nutrition: Goal: Adequate nutrition will be maintained Outcome: Not Progressing   "

## 2024-12-27 NOTE — Progress Notes (Addendum)
 IP PROGRESS NOTE  Subjective:   Henry May continues to have intermittent nausea/vomiting.  He had an episode of hematemesis this morning.  He had a soft brown bowel movement yesterday.  He developed abdominal pain after drinking milk.  He has a cough.  He reports dyspnea when off of oxygen .  Phenergan  helps the nausea.  He has vomited more since the octreotide  was discontinued. Objective: Vital signs in last 24 hours: Blood pressure 124/72, pulse 94, temperature 97.9 F (36.6 C), temperature source Oral, resp. rate (!) 24, height 6' (1.829 m), weight 149 lb 4.8 oz (67.7 kg), SpO2 98%.  Intake/Output from previous day: 01/11 0701 - 01/12 0700 In: 2332.9 [P.O.:30; I.V.:2052.9; IV Piggyback:250] Out: 1500 [Urine:800; Emesis/NG output:700]  Physical Exam:  HEENT: No thrush Lungs: Fair bilaterally, no respiratory distress Cardiac: Regular rate and rhythm Abdomen: Soft, nontender, no hepatomegaly Extremities: No leg edema   Portacath/PICC-without erythema  Lab Results: Recent Labs    12/25/24 0419 12/27/24 0341  WBC 3.3* 2.6*  HGB 9.1* 9.0*  HCT 28.2* 28.4*  PLT 249 264      BMET Recent Labs    12/26/24 0352 12/27/24 0341  NA 143 143  K 3.8 3.9  CL 110 110  CO2 23 23  GLUCOSE 125* 144*  BUN 41* 37*  CREATININE 0.69 0.68  CALCIUM  8.3* 8.6*    Lab Results  Component Value Date   CEA 3.57 01/15/2024      Medications: I have reviewed the patient's current medications.  Assessment/Plan: Gastric cancer 11/21/2023 EGD-gastric body with infiltrative looking lesion; biopsy shows involvement of a hypercellular lesion that suggests diffuse carcinoma by morphology CTs abdomen/pelvis 11/28/2023-diffuse fatty infiltration of the liver; gastric wall thickening; lymph nodes up to 6 mm in diameter near the stomach wall. 12/31/2023 upper endoscopy-large diffuse friable infiltrative polypoid and ulcerated circumferential mass found in the gastric body.  Scope was passed  beyond the mass with normal antrum/pylorus.  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  Negative for HER2 (1+); mismatch repair protein IHC normal; PD-L1 CPS 0%; CLDN18 positive: 90% of tumor cells with 2+/3+ membrane staining PET scan 01/07/2024-circumferential hypermetabolic gastric mucosal thickening.  Ill-defined hypermetabolic lymph nodes in the gastrohepatic ligament.  Intense hypermetabolic activity associated with the right lobe of the thyroid  gland.  Small hypermetabolic right axillary node favored reactive. 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  Gastric wall thickening slightly improved. Cycle 7 oxaliplatin , zolbetuximab 05/05/2024, Fulphila  Cycle 8 zolbetuximab 05/20/2024, oxaliplatin  held due to neuropathy, no Fulphila  Cycle 9 oxaliplatin /zolbetuximab 06/03/2024, Fulphila  Cycle 10 oxaliplatin /zolbetuximab 06/17/2024, Fulphila  Cycle 11 oxaliplatin /zolbetuximab 06/30/2024, Fulphila , oxaliplatin  dose reduced Cycle 12 zolbetuximab 07/15/2024, oxaliplatin  held secondary to neuropathy Cycle 13 zolbetuximab 07/29/2024, oxaliplatin  held secondary to neuropathy Cycle 14 zolbetuximab 08/26/2024, oxaliplatin  held secondary to neuropathy 09/01/2024 CTs-mild distal gastric wall thickening without discrete mass.  No metastatic disease. Cycle 15  zolbetuximab 09/09/2024, oxaliplatin  held due to neuropathy Cycle 16 zolbetuximab 09/30/2024, oxaliplatin  held due to neuropathy Cycle 17 zolbetuximab 10/21/2024, oxaliplatin  held due to neuropathy 11/03/2024 CT abdomen/pelvis: Diffuse mild wall  thickening of the distal gastric body and antrum-stable, mild increase in a right gastric lymph node small volume ascites without omental nodularity, proximal jejunal mural thickening 11/04/2024 upper GI: Narrowing of the gastric antrum, normal gastric emptying, dilated duodenum and proximal jejunum without evidence of a bowel obstruction 11/18/2024 upper endoscopy: Tumor in the gastric body and antrum with antral narrowing, dilated duodenum without evidence of mass or obstruction, esophagitis: Biopsy of the gastric mass-poorly differentiated adenocarcinoma with focal signet ring morphology, esophagus biopsy: Squamous mucosa with focal ulceration Cycle 1 weekly cisplatin  11/26/2024 Cycle 2 weekly cisplatin  12/07/2024 Cycle 3 weekly cisplatin  12/15/2024 Radiation to the gastric mass 12/17/2024 Cycle 4 weekly cisplatin  12/22/2024 12/12/2024 CTs: Pneumomediastinum extending into the neck, lower lobe consolidation, esophagus distention, air in the bladder, new mildly enlarged right hilar node, new small volume ascites Early satiety, postprandial abdominal pain, weight loss secondary to #1 History of bilateral lower extremity DVT 2021-anticoagulation discontinued due to massive retroperitoneal bleed, IVC filter placed 08/24/2020 Hospitalized with acute respiratory failure due to COVID-19 08/06/2020 - 09/04/2020 History of massive retroperitoneal bleed secondary to anticoagulation September 2021 Thyroid  ultrasound 01/13/2024-no abnormal nodule identified in the right lobe.  1.1 cm thyroid  isthmus nodule meets criteria for 1 year follow-up ultrasound. Admission 02/01/2024 with increased abdominal pain/ascites Hospitalization with altered mental status 02/05/2024 through  02/13/2024; question rare case of 5-FU psychosis.  Improved 02/16/2024.  Mental status at baseline 02/23/2024. Neutropenia 02/16/2024 Mucositis 02/16/2024.  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  Persistent cold sensitivity 05/20/2024, oxaliplatin  held.  Cold sensitivity resolved 06/03/2024, oxaliplatin  resumed.  Mild loss of vibratory sense, oxaliplatin  dose reduced 06/29/2024 Admission 11/28/2024 with intractable nausea and vomiting 11/27/2024 CT Abdo/pelvis: Bilateral lower lobe aspiration/pneumonia, thick-walled gastric body/antrum, dilated proximal small bowel, small volume pelvic ascites 11/30/2024 EGD: Malignant appearing severe stenosis at the gastric antrum-stent placed, dilated duodenum with retained fluid 15.  Anemia secondary to GI bleeding 12/12/2024 2 units RBCs, 2 units RBCs 12/16/2024, 2 units RBCs 12/18/2024, 2 units RBCs 12/23/2024 IV iron  12/21/2024   Henry May has metastatic gastric cancer.  There is evidence of disease progression with persistent/progressive tumor in the stomach and intractable nausea/vomiting.  The omental disease on presentation last year remains significantly improved, by CT criteria.  He responded well to initial treatment with oxaliplatin  based chemotherapy.  He had neuropathy symptoms from oxaliplatin .  He had psychosis following 5-fluorouracil .  He began a trial of salvage therapy with cisplatin  on 11/26/2024.  He completed cycle 4 on 12/22/2024.  He began palliative radiation to the gastric mass on 12/17/2024.  He is now admitted with intractable nausea/vomiting, potentially related to a small bowel obstruction, ileus from the carcinomatosis, in addition to persistent tumor in the stomach.   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  There was retained fluid in  the stomach and duodenum.  A gastric antral stent was placed.  He continues to have nausea and vomiting of liquids despite placement of the gastric antral stent.  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  The nausea partially improved with octreotide .  His wife reports Phenergan  helps the nausea.    He has intermittent hematemesis, likely secondary to the gastric mass.  GI does not recommend an endoscopy.  The hemoglobin was lower yesterday.  He received 2 units of packed red blood cells yesterday.  He began  palliative radiation to the gastric mass on 12/17/2024 and has completed 6 treatments to date.  Henry May  developed CT evidence of progressive aspiration pneumonia.  He also had pneumomediastinum with subcutaneous emphysema extending to the neck on a CT 12/12/2024.  The significance of the emphysematous findings is unclear.  I reviewed the CT images.  He may have a tumor related microperforation or perforation related to emesis.  Henry May has been evaluated by the palliative care service.  The family has met with the palliative care team. They understand the poor prognosis.   He confirmed his desire to continue treatment.  We submitted tissue for molecular testing to look for options for targeted treatment and assess the tumor mutation burden.  Results will be back during the week of 01/03/2025  I discussed his current status with Henry May and his daughter this morning.  I will add octreotide  back to the antiemetic regimen.  Recommendations: Continue TPN Continue Phenergan  and Ativan  as needed for nausea, continue antiacid therapy Continue palliative radiation to the gastric mass Packed red blood cells as needed for severe anemia Follow-up results of NGS testing on the gastric mass to look for additional treatment options Out of bed, ambulate as tolerated Resume octreotide  Decreased frequency of CBGs 9.   Incentive spirometer   LOS: 29  days   Arley Hof, MD   12/27/2024, 1:33 PM

## 2024-12-27 NOTE — Plan of Care (Signed)
  Problem: Education: Goal: Knowledge of General Education information will improve Description: Including pain rating scale, medication(s)/side effects and non-pharmacologic comfort measures Outcome: Progressing   Problem: Coping: Goal: Level of anxiety will decrease Outcome: Progressing   Problem: Pain Managment: Goal: General experience of comfort will improve and/or be controlled Outcome: Progressing

## 2024-12-27 NOTE — Progress Notes (Signed)
 " PROGRESS NOTE Henry May  FMW:[id number] DOB: 1966-01-03 DOA: 11/27/2024 PCP: [name], DO  Brief Narrative/Hospital Course: Henry May is a 59 y.o. male with PMH of presented to [facility] due to nausea, vomiting and coffee-ground emesis 2 days after he started new chemotherapy, and admitted for intractable nausea and vomiting, dilated small bowel loops and coffee-ground emesis.Recent EGD on 12/4 with LA grade D esophagitis without bleeding, and large infiltrative, sessile and ulcerated circumferential mass with contact of bleeding in gastric body and antrum with antral narrowing and luminal diameter of approximately 1.5 cm   In ED,  vss stable.CBC and CMP without significant finding.  Lipase 171. CT abdomen and pelvis>>dilated proximal small bowel suggesting SBO but without discrete transition point, thick walled gastric body/antrum without definite focal mass, mild nodularity along the pylorus, scarring with superimposed patchy peribronchovascular nodularity in the bilateral lower lobes right more than left suggesting mild aspiration or pneumonia.   GI, surgery oncology consulted EGD>> s/p gastric stent placement on 12/16.Was unable to tolerate diet.  Started on TPN.Plan was for discharge with TPN once logistic in place but nausea and vomiting gotten worse after stopping octreotide  and Decadron  and having hemoptysis with drop in hemoglobin and restarted on octreotide  infusion.   CT chest, abdomen and pelvis : showed new patchy substernal pneumomediastinum of indeterminate source, patchy consolidation in lower lobes and posterior LUL, esophageal distention with retained/reflux fluid in thoracic inlet, emphysematous cystitis and circumferential gastric thickening with adjacent stranding change, antropyloric stenting and continued duodenal dilation, thickening jejunal folds.  Overall with ongoing bleeding felt secondary to metastatic gastric carcinoma Oncology radiation following and on  chemotherapy along with XRT inhouse-to decrease tumor burden and provide symptomatic relief.  Subjective: Seen and examined Daughter at the bedside.  Patient resting comfortably He did had episode of vomiting after eating but has been having brown stool Overnight afebrile vital stable Labs show hemoglobin 9.0 WBC 2.6 normal platelet and stable CMP  Assessment and plan:  Gastric cancer-f/b Dr. 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 Now on weekly Cisplatin  ( last dose 1/7), on  daily XRT for  x 10 XRT (1/2, 1/5>>) to decrease tumor burden, obstruction and bleeding-family informs-oncology will be holding chemo this week-Patient having ongoing intermittent hematemesis. Octreotide  infusion restarted 12/26>> and off- cont plan per oncology. Cont TPN-pharmacy managing. Patient completed Unasyn  1/2, s/p multiple PRBC  again had 2 more units PRBC 1/8-repeat H&H improved- trend. Ferric gluconate 250 mg x 4 doses  ordered 1/6>> Continue SSI, PPI twice daily, Carafate  1 g tid, Antiemetics, folic acid .Palliative following As per oncology--follow-up results of NGS testing on the gastric mass to look for additional treatment options  Shoulder this Tylenol  level so many small joints knee pain and described patient would like ER having been, x-ray abd 1/8-nonobstructive and no more nausea-advance diet as tolerated   Recent Labs  Lab 12/23/24 0440 12/23/24 2232 12/24/24 0424 12/25/24 0419 12/27/24 0341  HGB 7.3* 10.0* 10.3* 9.1* 9.0*  HCT 23.0* 30.0* 30.1* 28.2* 28.4*    Aspiration pneumonitis/pneumonia Pneumomediastinum noted on the CT chest: Pneumomediastinum likely from retching and vomiting.Patient completed Unasyn  1/2 cont aspiration  precaution.  Leukopenia: Stable monitor closely while on chemo.   Diabetes-diet controlled Hemoglobin A1c 5.7% (11/28/2024). Cbg ok on ssi while on TPN.   History of DVT: Status  post IVC filter.     Elevated lipase: Improved.   Insomnia mainly due to intractable nausea and vomiting: Cont IV Ativan  and Phenergan .  Lightheadedness/presyncope Deconditioning: Likely secondary to intractable nausea vomiting hematemesis and volume depletion.resolved. ambulating.  Severe malnutrition On TPN as above. Body mass index is 20.25 kg/m.   Mobility: PT Orders:  PT Follow up Rec: No Pt Follow Up1/05/2025 1434   DVT prophylaxis: Place and maintain sequential compression device Start: 12/15/24 1907 SCDs Start: 11/28/24 1042 no chemical prophylaxis due to hematemesis/GI bleeding Code Status:   Code Status: Limited: Do not attempt resuscitation (DNR) -DNR-LIMITED -Do Not Intubate/DNI  Family Communication: plan of care discussed with patient and family at bedside. Patient status is: Remains hospitalized because of severity of illness Level of care: Progressive   Dispo: The patient is from: home            Anticipated disposition: Disposition as per oncology after completion of chemo and radiation Objective: Vitals last 24 hrs: Vitals:   12/26/24 0403 12/26/24 1418 12/26/24 2014 12/27/24 0410  BP: 117/66 115/79 124/83 124/72  Pulse: 100 89 84 94  Resp: (!) 25 20 20  (!) 24  Temp: 98 F (36.7 C) 97.7 F (36.5 C) 97.6 F (36.4 C) 97.9 F (36.6 C)  TempSrc: Oral Oral Oral Oral  SpO2: 96% 100% 100% 98%  Weight:      Height:       Physical Examination: General exam: AAOX3 HEENT:Oral mucosa moist, Ear/Nose WNL grossly Respiratory system: Bilaterally clear NAD Cardiovascular system: S1 & S2 +, No JVD. Gastrointestinal system: Abdomen soft  mildly tender centrally, BS+ Nervous System: Alert, awake, non focal Extremities: extremities warm, leg edema mild Skin: Warm, no rashes MSK:  thin muscle bulk,tone, power.   Medications reviewed:  Scheduled Meds:  Chlorhexidine  Gluconate Cloth  6 each Topical Daily   insulin  aspart  0-15 Units Subcutaneous 2 times per day   mouth rinse  15 mL Mouth Rinse 4 times per day   pantoprazole  (PROTONIX ) IV  80 mg Intravenous Q12H   sucralfate   1 g Oral TID   Continuous Infusions:  promethazine  (PHENERGAN ) injection (IM or IVPB) 25 mg (12/27/24 0933)   TPN CYCLIC-ADULT (ION)     Diet: Diet Order             Diet full liquid Room service appropriate? Yes; Fluid consistency: Thin  Diet effective now                  Data Reviewed: I have personally reviewed following labs and imaging studies ( see epic result tab) CBC: Recent Labs  Lab 12/21/24 0416 12/22/24 0409 12/23/24 0440 12/23/24 2232 12/24/24 0424 12/25/24 0419 12/27/24 0341  WBC 2.6* 3.4* 3.1*  --  3.2* 3.3* 2.6*  NEUTROABS 1.9 2.4 2.2  --  2.6 2.8  --   HGB 8.1* 8.3* 7.3* 10.0* 10.3* 9.1* 9.0*  HCT 24.3* 25.5* 23.0* 30.0* 30.1* 28.2* 28.4*  MCV 89.7 91.4 92.0  --  89.6 91.6 95.6  PLT 245 291 284  --  270 249 264   CMP: Recent Labs  Lab 12/22/24 0409 12/23/24 0440 12/25/24 0419 12/26/24 0352 12/27/24 0341  NA 137 136 143 143 143  K 4.4 4.5 3.8 3.8 3.9  CL 103 104 110 110 110  CO2 26 24 22 23 23   GLUCOSE 121* 139* 140* 125* 144*  BUN 24* 21* 41* 41* 37*  CREATININE 0.56* 0.50* 0.69 0.69 0.68  CALCIUM  8.1* 8.1* 8.0* 8.3*  8.6*  MG  --  2.5* 2.4  --  2.4  PHOS 3.1 2.9 2.8  --  3.1   GFR: Estimated Creatinine Clearance: 94 mL/min (by C-G formula based on SCr of 0.68 mg/dL). Recent Labs  Lab 12/22/24 0409 12/23/24 0440 12/27/24 0341  AST  --  17 21  ALT  --  32 25  ALKPHOS  --  145* 141*  BILITOT  --  0.4 0.4  PROT  --  5.7* 6.0*  ALBUMIN  2.5* 2.6* 2.6*   No results for input(s): LIPASE, AMYLASE in the last 168 hours. No results for input(s): AMMONIA in the last 168 hours. Coagulation Profile: No results for input(s): INR, PROTIME  in the last 168 hours. Unresulted Labs (From admission, onward)     Start     Ordered   12/27/24 0500  Basic metabolic panel with GFR  Daily,   R     Question:  Specimen collection method  Answer:  Lab=Lab collect   12/26/24 1007   12/27/24 0500  CBC  Daily,   R     Question:  Specimen collection method  Answer:  Lab=Lab collect   12/26/24 1007   12/16/24 0500  Phosphorus  (Standard TPN Labs (all labs to be drawn at 0500))  Every Mon,Thu (0500),   R     Question:  Specimen collection method  Answer:  IV Team=IV Team collect   12/15/24 0741   12/06/24 0500  Comprehensive metabolic panel  (TPN Lab Panel)  Every Mon,Thu (0500),   R     Question:  Specimen collection method  Answer:  IV Team=IV Team collect   12/04/24 1026   12/06/24 0500  Magnesium   (TPN Lab Panel)  Every Mon,Thu (0500),   R     Question:  Specimen collection method  Answer:  IV Team=IV Team collect   12/04/24 1026   12/06/24 0500  Triglycerides  (TPN Lab Panel)  Every Monday (0500),   R     Question:  Specimen collection method  Answer:  IV Team=IV Team collect   12/04/24 1026           Antimicrobials/Microbiology: Anti-infectives (From admission, onward)    Start     Dose/Rate Route Frequency Ordered Stop   12/11/24 0900  Ampicillin -Sulbactam (UNASYN ) 3 g in sodium chloride  0.9 % 100 mL IVPB        3 g 200 mL/hr over 30 Minutes Intravenous Every 6 hours 12/11/24 0817 12/18/24 0950   11/28/24 1200  Ampicillin -Sulbactam (UNASYN ) 3 g in sodium chloride  0.9 % 100 mL IVPB  Status:  Discontinued        3 g 200 mL/hr over 30 Minutes Intravenous Every 6 hours 11/28/24 1048 12/05/24 0909   11/28/24 0045  Ampicillin -Sulbactam (UNASYN ) 3 g in sodium chloride  0.9 % 100 mL IVPB        3 g 200 mL/hr over 30 Minutes Intravenous  Once 11/28/24 0042 11/28/24 0134         Component Value Date/Time   SDES  02/05/2024 1904    BLOOD BLOOD RIGHT ARM Performed at [facility], [address]  [place], [place] [address]    [facility]  02/05/2024 1904    Blood Culture adequate volume BOTTLES DRAWN AEROBIC AND ANAEROBIC Performed at [facility], [address], [place], [place] [address]    CULT  02/05/2024 1904    NO GROWTH 5 DAYS Performed at [facility], [address] [place],  [place] [address]    REPTSTATUS 02/10/2024 FINAL 02/05/2024 1904    Procedures: Procedures (LRB): EGD (ESOPHAGOGASTRODUODENOSCOPY) (N/A) INSERTION, STENT, DUODENUM (N/A)   Mennie LAMY, MD [facility] 12/27/2024, 10:35 AM   "

## 2024-12-27 NOTE — Progress Notes (Addendum)
 PHARMACY - TOTAL PARENTERAL NUTRITION CONSULT NOTE   Indication: intolerance to enteral feeds  Patient Measurements: Height: 6' (182.9 cm) Weight:  (unable to obtain weight) IBW/kg (Calculated) : 77.6 TPN AdjBW (KG): 70 Body mass index is 20.25 kg/m.  Assessment: 4 yoM admitted on 12/13 with intractable nausea and vomiting, dilated small bowel loops, and coffee-ground emesis. 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 He underwent EGD 12/16 showing esophagitis, gastric stenosis, stent placed, impaired peristalsis. Pharmacy was consulted to dose TPN on 12/20 for intolerance to enteral feeding.    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; dilated duodenum - 12/25 AXR: nonobstructive bowel gas pattern - 12/27 AXR: unchanged - 12/28 CT: nonspecific esophageal distention, enteritis, new small volume of ascites  GI Surgeries / Procedures:  -12/16 EGD: esophagitis  GI meds:   - Protonix  80 mg IV q12h (increased 1/8) - Sucralfate  1g PO TID  Central access: Implanted Port (01/22/24) TPN start date: 12/20   Nutritional Goals: Cyclic TPN, total volume 2160 mL, provides 119 g of protein and 2298 kcals per day  RD Assessment: Estimated Needs Total Energy Estimated Needs: 2100-2450 kcals Total Protein Estimated Needs: 105-120 grams Total Fluid Estimated Needs: >/= 2.1L  Current Nutrition:  FLD starting 1/11 TPN  Plan:  Continue cyclic TPN over 16 hours Consider  decreasing to 14-hr cycle tomorrow as tolerated Electrolytes in TPN: decrease Cl:Ac ratio slightly Na 75 mEq/L K 40 mEq/L Ca 2 mEq/L Mg 5 mEq/L Phos 20 mmol/L Cl:Ac 1:1 >> 1:2 Add standard MVI and trace elements to TPN Chromium remains on hold d/t national shortage Continue mSSI - given good glucose control, will limit finger sticks to 2-hr post TPN start (1 hr after reaching max rate), and 1-hr post TPN stop TPN labs on Mon/Thurs  Bard Jeans, PharmD, BCPS [phone] 12/27/2024, 7:37 AM

## 2024-12-28 ENCOUNTER — Other Ambulatory Visit: Payer: Self-pay

## 2024-12-28 ENCOUNTER — Ambulatory Visit: Payer: Self-pay

## 2024-12-28 DIAGNOSIS — K920 Hematemesis: Secondary | ICD-10-CM | POA: Diagnosis not present

## 2024-12-28 LAB — BASIC METABOLIC PANEL WITH GFR
Anion gap: 11 (ref 5–15)
BUN: 33 mg/dL — ABNORMAL HIGH (ref 6–20)
CO2: 23 mmol/L (ref 22–32)
Calcium: 8.5 mg/dL — ABNORMAL LOW (ref 8.9–10.3)
Chloride: 109 mmol/L (ref 98–111)
Creatinine, Ser: 0.65 mg/dL (ref 0.61–1.24)
GFR, Estimated: >60 mL/min
Glucose, Bld: 187 mg/dL — ABNORMAL HIGH (ref 70–99)
Potassium: 4.1 mmol/L (ref 3.5–5.1)
Sodium: 143 mmol/L (ref 135–145)

## 2024-12-28 LAB — RAD ONC ARIA SESSION SUMMARY
Course Elapsed Days: 11
Plan Fractions Treated to Date: 7
Plan Prescribed Dose Per Fraction: 3 Gy
Plan Total Fractions Prescribed: 9
Plan Total Prescribed Dose: 27 Gy
Reference Point Dosage Given to Date: 21 Gy
Reference Point Session Dosage Given: 3 Gy
Session Number: 8

## 2024-12-28 LAB — CBC
HCT: 27.9 % — ABNORMAL LOW (ref 39.0–52.0)
Hemoglobin: 8.8 g/dL — ABNORMAL LOW (ref 13.0–17.0)
MCH: 30.3 pg (ref 26.0–34.0)
MCHC: 31.5 g/dL (ref 30.0–36.0)
MCV: 96.2 fL (ref 80.0–100.0)
Platelets: 275 K/uL (ref 150–400)
RBC: 2.9 MIL/uL — ABNORMAL LOW (ref 4.22–5.81)
RDW: 16.4 % — ABNORMAL HIGH (ref 11.5–15.5)
WBC: 2.8 K/uL — ABNORMAL LOW (ref 4.0–10.5)
nRBC: 3.6 % — ABNORMAL HIGH (ref 0.0–0.2)

## 2024-12-28 LAB — MAGNESIUM: Magnesium: 2.3 mg/dL (ref 1.7–2.4)

## 2024-12-28 LAB — GLUCOSE, CAPILLARY
Glucose-Capillary: 137 mg/dL — ABNORMAL HIGH (ref 70–99)
Glucose-Capillary: 90 mg/dL (ref 70–99)
Glucose-Capillary: 94 mg/dL (ref 70–99)

## 2024-12-28 MED ORDER — INSULIN ASPART 100 UNIT/ML IJ SOLN
0.0000 [IU] | Freq: Four times a day (QID) | INTRAMUSCULAR | Status: DC
  Administered 2024-12-29: 2 [IU] via SUBCUTANEOUS
  Administered 2024-12-29 – 2024-12-30 (×3): 3 [IU] via SUBCUTANEOUS
  Administered 2024-12-30: 2 [IU] via SUBCUTANEOUS
  Administered 2024-12-30 – 2024-12-31 (×3): 3 [IU] via SUBCUTANEOUS
  Filled 2024-12-28 (×2): qty 3
  Filled 2024-12-28 (×2): qty 2
  Filled 2024-12-28: qty 3
  Filled 2024-12-28: qty 2
  Filled 2024-12-28 (×3): qty 3

## 2024-12-28 MED ORDER — SODIUM CHLORIDE 0.9 % IV SOLN
12.5000 mg | INTRAVENOUS | Status: DC | PRN
  Administered 2024-12-28 – 2024-12-31 (×12): 12.5 mg via INTRAVENOUS
  Filled 2024-12-28 (×2): qty 0.5
  Filled 2024-12-28 (×2): qty 12.5
  Filled 2024-12-28: qty 0.5
  Filled 2024-12-28 (×3): qty 12.5
  Filled 2024-12-28: qty 0.5
  Filled 2024-12-28 (×4): qty 12.5
  Filled 2024-12-28: qty 0.5
  Filled 2024-12-28: qty 12.5
  Filled 2024-12-28 (×2): qty 0.5
  Filled 2024-12-28 (×2): qty 12.5

## 2024-12-28 MED ORDER — TRAVASOL 10 % IV SOLN
INTRAVENOUS | Status: DC
  Filled 2024-12-28: qty 1188

## 2024-12-28 MED ORDER — MAGNESIUM SULFATE 2 GM/50ML IV SOLN
2.0000 g | Freq: Once | INTRAVENOUS | Status: AC
  Administered 2024-12-29: 2 g via INTRAVENOUS
  Filled 2024-12-28: qty 50

## 2024-12-28 MED ORDER — PROMETHAZINE HCL 6.25 MG/5ML PO SOLN
25.0000 mg | Freq: Four times a day (QID) | ORAL | Status: DC | PRN
  Administered 2024-12-29: 25 mg via ORAL
  Filled 2024-12-28 (×4): qty 20

## 2024-12-28 MED ORDER — POTASSIUM CHLORIDE IN NACL 20-0.9 MEQ/L-% IV SOLN
Freq: Once | INTRAVENOUS | Status: AC
  Filled 2024-12-28: qty 1000

## 2024-12-28 NOTE — Progress Notes (Signed)
 IP PROGRESS NOTE  Subjective:   [name] appeared stable when I saw him at approximately 7:15 AM.  He had another bowel movement yesterday.  No more hematemesis.  He continues to vomit intermittently.  Phenergan  helps the nausea.  His wife and daughter were at the bedside.  No pain. Objective: Vital signs in last 24 hours: Blood pressure (!) 130/90, pulse 95, temperature 98.1 F (36.7 C), temperature source Oral, resp. rate 14, height 6' (1.829 m), weight 158 lb 15.2 oz (72.1 kg), SpO2 99%.  Intake/Output from previous day: 01/12 0701 - 01/13 0700 In: 3692.3 [P.O.:120; I.V.:3324.4; IV Piggyback:247.9] Out: 1000 [Urine:1000]  Physical Exam:  HEENT: No thrush  Abdomen: Soft, nontender, no hepatomegaly Extremities: No leg edema   Portacath/PICC-without erythema  Lab Results: Recent Labs    12/27/24 0341 12/28/24 0409  WBC 2.6* 2.8*  HGB 9.0* 8.8*  HCT 28.4* 27.9*  PLT 264 275      BMET Recent Labs    12/27/24 0341 12/28/24 0409  NA 143 143  K 3.9 4.1  CL 110 109  CO2 23 23  GLUCOSE 144* 187*  BUN 37* 33*  CREATININE 0.68 0.65  CALCIUM  8.6* 8.5*    Lab Results  Component Value Date   CEA 3.57 01/15/2024      Medications: I have reviewed the patient's current medications.  Assessment/Plan: Gastric cancer 11/21/2023 EGD-gastric body with infiltrative looking lesion; biopsy shows involvement of a hypercellular lesion that suggests diffuse carcinoma by morphology CTs abdomen/pelvis 11/28/2023-diffuse fatty infiltration of the liver; gastric wall thickening; lymph nodes up to 6 mm in diameter near the stomach wall. 12/31/2023 upper endoscopy-large diffuse friable infiltrative polypoid and ulcerated circumferential mass found in the gastric body.  Scope was passed beyond the mass with normal antrum/pylorus.  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  Negative for HER2 (1+); mismatch  repair protein IHC normal; PD-L1 CPS 0%; CLDN18 positive: 90% of tumor cells with 2+/3+ membrane staining PET scan 01/07/2024-circumferential hypermetabolic gastric mucosal thickening.  Ill-defined hypermetabolic lymph nodes in the gastrohepatic ligament.  Intense hypermetabolic activity associated with the right lobe of the thyroid  gland.  Small hypermetabolic right axillary node favored reactive. 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  Gastric wall thickening slightly improved. Cycle 7 oxaliplatin , zolbetuximab 05/05/2024, Fulphila  Cycle 8 zolbetuximab 05/20/2024, oxaliplatin  held due to neuropathy, no Fulphila  Cycle 9 oxaliplatin /zolbetuximab 06/03/2024, Fulphila  Cycle 10 oxaliplatin /zolbetuximab 06/17/2024, Fulphila  Cycle 11 oxaliplatin /zolbetuximab 06/30/2024, Fulphila , oxaliplatin  dose reduced Cycle 12 zolbetuximab 07/15/2024, oxaliplatin  held secondary to neuropathy Cycle 13 zolbetuximab 07/29/2024, oxaliplatin  held secondary to neuropathy Cycle 14 zolbetuximab 08/26/2024, oxaliplatin  held secondary to neuropathy 09/01/2024 CTs-mild distal gastric wall thickening without discrete mass.  No metastatic disease. Cycle 15 zolbetuximab 09/09/2024, oxaliplatin  held due to neuropathy Cycle 16 zolbetuximab 09/30/2024, oxaliplatin  held due to neuropathy Cycle 17 zolbetuximab 10/21/2024, oxaliplatin  held due to  neuropathy 11/03/2024 CT abdomen/pelvis: Diffuse mild wall thickening of the distal gastric body and antrum-stable, mild increase in a right gastric lymph node small volume ascites without omental nodularity, proximal jejunal mural thickening 11/04/2024  upper GI: Narrowing of the gastric antrum, normal gastric emptying, dilated duodenum and proximal jejunum without evidence of a bowel obstruction 11/18/2024 upper endoscopy: Tumor in the gastric body and antrum with antral narrowing, dilated duodenum without evidence of mass or obstruction, esophagitis: Biopsy of the gastric mass-poorly differentiated adenocarcinoma with focal signet ring morphology, esophagus biopsy: Squamous mucosa with focal ulceration Cycle 1 weekly cisplatin  11/26/2024 Cycle 2 weekly cisplatin  12/07/2024 Cycle 3 weekly cisplatin  12/15/2024 Radiation to the gastric mass 12/17/2024 Cycle 4 weekly cisplatin  12/22/2024 12/12/2024 CTs: Pneumomediastinum extending into the neck, lower lobe consolidation, esophagus distention, air in the bladder, new mildly enlarged right hilar node, new small volume ascites Early satiety, postprandial abdominal pain, weight loss secondary to #1 History of bilateral lower extremity DVT 2021-anticoagulation discontinued due to massive retroperitoneal bleed, IVC filter placed 08/24/2020 Hospitalized with acute respiratory failure due to COVID-19 08/06/2020 - 09/04/2020 History of massive retroperitoneal bleed secondary to anticoagulation September 2021 Thyroid  ultrasound 01/13/2024-no abnormal nodule identified in the right lobe.  1.1 cm thyroid  isthmus nodule meets criteria for 1 year follow-up ultrasound. Admission 02/01/2024 with increased abdominal pain/ascites Hospitalization with altered mental status 02/05/2024 through 02/13/2024; question rare case of 5-FU psychosis.  Improved 02/16/2024.  Mental status at baseline 02/23/2024. Neutropenia 02/16/2024 Mucositis 02/16/2024.  Resolved 02/23/2024 Right knee  pain/edema/erythema 03/01/2024-Doppler study negative for DVT History of gout Oxaliplatin  neuropathy-prolonged cold sensitivity, diminished vibratory sense following cycle 5 chemotherapy.  Persistent cold sensitivity 05/20/2024, oxaliplatin  held.  Cold sensitivity resolved 06/03/2024, oxaliplatin  resumed.  Mild loss of vibratory sense, oxaliplatin  dose reduced 06/29/2024 Admission 11/28/2024 with intractable nausea and vomiting 11/27/2024 CT Abdo/pelvis: Bilateral lower lobe aspiration/pneumonia, thick-walled gastric body/antrum, dilated proximal small bowel, small volume pelvic ascites 11/30/2024 EGD: Malignant appearing severe stenosis at the gastric antrum-stent placed, dilated duodenum with retained fluid 15.  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  There is evidence of disease progression with persistent/progressive tumor in the stomach and intractable nausea/vomiting.  The omental disease on presentation last year remains significantly improved, by CT criteria.  He responded well to initial treatment with oxaliplatin  based chemotherapy.  He had neuropathy symptoms from oxaliplatin .  He had psychosis following 5-fluorouracil .  He began a trial of salvage therapy with cisplatin  on 11/26/2024.  He completed cycle 4 on 12/22/2024.  He began palliative radiation to the gastric mass on 12/17/2024.  He is now admitted with intractable nausea/vomiting, potentially related to a small bowel obstruction, ileus from the carcinomatosis, in addition to persistent tumor in the stomach.   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  There was retained fluid in the stomach and duodenum.  A gastric antral stent was placed.  He continues to have nausea and vomiting of liquids despite placement of the gastric antral stent.  He appears to have  a severe ileus or unrecognized mechanical obstruction from carcinomatosis.  The nausea partially improved with octreotide .  Octreotide  was resumed yesterday.  His wife reports Phenergan  helps the nausea.    He has intermittent hematemesis, likely secondary to the gastric mass.  GI does not recommend an endoscopy.  The hemoglobin is stable.  He received 2 units of packed red blood cells on 08/04/2025.  He began palliative radiation to the gastric mass on 12/17/2024 and has completed 7 treatments to date.  Mr Paddock  developed CT evidence  of progressive aspiration pneumonia.  He also had pneumomediastinum with subcutaneous emphysema extending to the neck on a CT 12/12/2024.  The significance of the emphysematous findings is unclear.  I reviewed the CT images.  He may have a tumor related microperforation or perforation related to emesis.  Mr Sine has been evaluated by the palliative care service.  The family has met with the palliative care team. They understand the poor prognosis.   He confirmed his desire to continue treatment.  We submitted tissue for molecular testing to look for options for targeted treatment and assess the tumor mutation burden.  Results will be back during the week of 01/03/2025  I discussed his current status with Mr Pinzon and his family.  He will complete another treatment with cisplatin  tomorrow.    Recommendations: Continue TPN Continue Phenergan  and Ativan  as needed for nausea, continue antiacid therapy, and a trial of oral Phenergan  Continue palliative radiation to the gastric mass Packed red blood cells as needed for severe anemia Follow-up results of NGS testing on the gastric mass to look for additional treatment options Out of bed, ambulate as tolerated Continue octreotide  Decreased frequency of CBGs 9.   Incentive spirometer 10.  Cycle 5 weekly cisplatin  12/29/2024   LOS: 30 days   Arley Hof, MD   12/28/2024, 8:02 AM

## 2024-12-28 NOTE — Progress Notes (Signed)
 PHARMACY - TOTAL PARENTERAL NUTRITION CONSULT NOTE   Indication: intolerance to enteral feeds  Patient Measurements: Height: 6' (182.9 cm) Weight: 72.1 kg (158 lb 15.2 oz) IBW/kg (Calculated) : 77.6 TPN AdjBW (KG): 70 Body mass index is 21.56 kg/m.  Assessment: 77 yoM admitted on 12/13 with intractable nausea and vomiting, dilated small bowel loops, and coffee-ground emesis. 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 He underwent EGD 12/16 showing esophagitis, gastric stenosis, stent placed, impaired peristalsis. Pharmacy was consulted to dose TPN on 12/20 for intolerance to enteral feeding.    - Octreotide  resumed (as 100 mcg BID injections) on 1/12  Glucose / Insulin : No hx DM; A1c 5.7% in Dec - CBGs now back up after starting octreotide ; no low CBGs while off TPN   - 7 units SSI given yesterday Electrolytes: WNL & stable Renal: SCr stable at baseline (<1); BUN elevated but trending down today - UOP low, but does not appear to be fully charted Hepatic: WNL, alk phos minimally elevated; albumin  remains low but stable (12/27/24) - TG WNL1/12 Intake / Output:  - another episode of abdominal pain and hematemesis after PO intake yesterday, but is having stool - LBM 1/11 GI Imaging: - 12/18 AXR: No evidence of obstruction; 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; dilated duodenum - 12/25 AXR: nonobstructive bowel gas pattern - 12/27 AXR: unchanged - 12/28 CT: nonspecific esophageal distention, enteritis, new small volume of ascites  GI Surgeries / Procedures:  -12/16 EGD: esophagitis  GI meds:   - Protonix  80 mg IV BID (increased 1/8) - Sucralfate  1g PO TID - Octreotide  100 mg SQ BID  Central access: Implanted Port (01/22/24) TPN start date: 12/20   Nutritional Goals: Cyclic TPN, total volume 2160 mL, provides 119 g of protein and 2298 kcals per day  RD Assessment: Estimated Needs Total Energy Estimated Needs: 2100-2450 kcals Total Protein Estimated Needs:  105-120 grams Total Fluid Estimated Needs: >/= 2.1L  Current Nutrition:  FLD starting 1/11 TPN  Plan:  Given resumption of octreotide  (with expected dysregulation of blood glucose), as well as GI issues precluding meaningful advancement in diet or imminent discharge, the risks of cyclic TPN do not outweigh the benefits. Will resume continuous TPN. For discharge planning, please note that at least 2-3 days AFTER becoming stable on continuous TPN are required to fully transition patients to cyclic TPN. Resume continuous TPN at 90 ml/hr (to deliver same volume) Electrolytes in TPN: no changes today Na 75 mEq/L K 40 mEq/L Ca 2 mEq/L Mg 5 mEq/L Phos 20 mmol/L Cl:Ac 1:2 Add standard MVI and trace elements to TPN Chromium remains on hold d/t national shortage Continue mSSI; will convert back to q6 hr CBG checks given expected changes from octreotide ; would reduce frequency as soon as stable to minimize finger sticks Maintenance IV fluids per MD (none currently) TPN labs on Mon/Thurs  Bard Jeans, PharmD, BCPS [phone] 12/28/2024, 7:14 AM

## 2024-12-28 NOTE — Progress Notes (Signed)
 [facility] CSW Progress Note  Clinical Child Psychotherapist reached out to patient's spouse via email regarding insurances. CSW inquired if insurance was able to be changed and if patient is enrolled in tri-care through [place].  For history: Spouse previously informed CSW of OON challenges with insurance. CSW sent response with insurances that are in network with [facility] and number for [facility] navigator Consortium for assistance.  CSW awaiting response    Lizbeth Sprague, LCSW Clinical Social Worker [facility]

## 2024-12-28 NOTE — Progress Notes (Signed)
 " PROGRESS NOTE Henry May  FMW:[id number] DOB: 1966-01-14 DOA: 11/27/2024 PCP: Henry Lunger, DO  Brief Narrative/Hospital Course: Henry May is a 59 y.o. male with PMH of presented to [facility] due to nausea, vomiting and coffee-ground emesis 2 days after he started new chemotherapy, and admitted for intractable nausea and vomiting, dilated small bowel loops and coffee-ground emesis.Recent EGD on 12/4 with LA grade D esophagitis without bleeding, and large infiltrative, sessile and ulcerated circumferential mass with contact of bleeding in gastric body and antrum with antral narrowing and luminal diameter of approximately 1.5 cm   In ED,  vss stable.CBC and CMP without significant finding.  Lipase 171. CT abdomen and pelvis>>dilated proximal small bowel suggesting SBO but without discrete transition point, thick walled gastric body/antrum without definite focal mass, mild nodularity along the pylorus, scarring with superimposed patchy peribronchovascular nodularity in the bilateral lower lobes right more than left suggesting mild aspiration or pneumonia.   GI, surgery oncology consulted EGD>> s/p gastric stent placement on 12/16.Was unable to tolerate diet.  Started on TPN.Plan was for discharge with TPN once logistic in place but nausea and vomiting gotten worse after stopping octreotide  and Decadron  and having hemoptysis with drop in hemoglobin and restarted on octreotide  infusion.   CT chest, abdomen and pelvis : showed new patchy substernal pneumomediastinum of indeterminate source, patchy consolidation in lower lobes and posterior LUL, esophageal distention with retained/reflux fluid in thoracic inlet, emphysematous cystitis and circumferential gastric thickening with adjacent stranding change, antropyloric stenting and continued duodenal dilation, thickening jejunal folds.  Overall with ongoing bleeding felt secondary to metastatic gastric carcinoma Oncology radiation following and on  chemotherapy along with XRT inhouse-to decrease tumor burden and provide symptomatic relief.  Subjective: Seen and examined Tried some broth about vomiting of biliary emesis this morning but  no hematemesis it seems Overnight afebrile BP stable, labs this morning showed fairly stable hemoglobin 8.8 mild leukopenia  Assessment and plan:  Gastric cancer-f/b Dr. 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 Now on weekly Cisplatin  ( last dose 1/7), on  daily XRT for  x 10 XRT (1/2, 1/5>>) to decrease tumor burden, obstruction and bleeding-family informs-oncology will be holding chemo this week-Patient having ongoing intermittent hematemesis. Octreotide  infusion restarted 12/26>> now on octreotide  twice daily per oncology. Cont TPN Patient completed Unasyn  1/2, s/p multiple PRBC  again had 2 more units PRBC 1/8-repeat H&H improved- trend. Ferric gluconate 250 mg x 4 doses  ordered 1/6>> Continue SSI, PPI twice daily, Carafate  1 g tid, Antiemetics, folic acid .Palliative following As per oncology--follow-up results of NGS testing on the gastric mass to look for additional treatment options   x-ray abd 1/8-nonobstructive and no more nausea-advance diet as tolerated   Oncology continues to follow along. Recent Labs  Lab 12/23/24 2232 12/24/24 0424 12/25/24 0419 12/27/24 0341 12/28/24 0409  HGB 10.0* 10.3* 9.1* 9.0* 8.8*  HCT 30.0* 30.1* 28.2* 28.4* 27.9*    Aspiration pneumonitis/pneumonia Pneumomediastinum noted on the CT chest: Pneumomediastinum likely from retching and vomiting.Patient completed Unasyn  1/2 cont aspiration precaution.  Leukopenia: Stable monitor closely while on chemo.   Diabetes-diet controlled Hemoglobin A1c 5.7% (11/28/2024). Cbg ok  on ssi while on TPN.   History of DVT: Status post IVC filter.     Elevated lipase: Improved.   Insomnia mainly due to intractable nausea and  vomiting: Cont IV Ativan  and Phenergan /antiemetics.  Having biliary vomiting today.  Lightheadedness/presyncope Deconditioning: Likely secondary to intractable nausea vomiting hematemesis and volume depletion.resolved. ambulating.  Severe malnutrition On TPN as above. Body mass index is 21.56 kg/m.   Mobility: PT Orders:  PT Follow up Rec: No Pt Follow Up1/05/2025 1434   DVT prophylaxis: Place and maintain sequential compression device Start: 12/15/24 1907 SCDs Start: 11/28/24 1042 no chemical prophylaxis due to hematemesis/GI bleeding Code Status:   Code Status: Limited: Do not attempt resuscitation (DNR) -DNR-LIMITED -Do Not Intubate/DNI  Family Communication: plan of care discussed with patient and family at bedside. Patient status is: Remains hospitalized because of severity of illness Level of care: Progressive   Dispo: The patient is from: home            Anticipated disposition: Disposition as per oncology after completion of chemo and radiation Objective: Vitals last 24 hrs: Vitals:   12/27/24 1440 12/27/24 2020 12/28/24 0500 12/28/24 0518  BP: 130/86 128/79  (!) 130/90  Pulse: 90 90  95  Resp: 20 17  14   Temp: 98 F (36.7 C) 97.7 F (36.5 C)  98.1 F (36.7 C)  TempSrc: Oral Oral  Oral  SpO2: 100% 100%  99%  Weight:   72.1 kg   Height:       Physical Examination: General exam: AAOX3 HEENT:Oral mucosa moist, Ear/Nose WNL grossly Respiratory system: Bilaterally clear  Cardiovascular system: S1 & S2 +, No JVD. Gastrointestinal system: Abdomen soft no significant tenderness BS+ Nervous System: Alert, awake, non focal Extremities: extremities warm, leg edema mild Skin: Warm, no rashes MSK: thin muscle bulk,tone, power.   Medications reviewed:  Scheduled Meds:  Chlorhexidine  Gluconate Cloth  6 each Topical Daily    insulin  aspart  0-15 Units Subcutaneous Q6H   octreotide   100 mcg Subcutaneous BID   mouth rinse  15 mL Mouth Rinse 4 times per day   pantoprazole  (PROTONIX ) IV  80 mg Intravenous Q12H   sucralfate   1 g Oral TID   Continuous Infusions:  promethazine  (PHENERGAN ) injection (IM or IVPB) 25 mg (12/28/24 1047)   TPN ADULT (ION)     Diet: Diet Order             Diet full liquid Room service appropriate? Yes; Fluid consistency: Thin  Diet effective now                  Data Reviewed: I have personally reviewed following labs and imaging studies ( see epic result tab) CBC: Recent Labs  Lab 12/22/24 0409 12/23/24 0440 12/23/24 2232 12/24/24 0424 12/25/24 0419 12/27/24 0341 12/28/24 0409  WBC 3.4* 3.1*  --  3.2* 3.3* 2.6* 2.8*  NEUTROABS 2.4 2.2  --  2.6 2.8  --   --   HGB 8.3* 7.3* 10.0* 10.3* 9.1* 9.0* 8.8*  HCT 25.5* 23.0* 30.0* 30.1* 28.2* 28.4* 27.9*  MCV 91.4 92.0  --  89.6 91.6 95.6 96.2  PLT 291 284  --  270 249 264 275   CMP: Recent Labs  Lab 12/22/24 0409 12/23/24 0440 12/25/24 0419 12/26/24 0352 12/27/24 0341 12/28/24 0409  NA 137 136 143 143 143 143  K 4.4 4.5 3.8 3.8 3.9 4.1  CL 103 104 110 110 110 109  CO2 26 24 22 23 23 23   GLUCOSE 121* 139* 140* 125* 144* 187*  BUN 24* 21* 41* 41* 37* 33*  CREATININE 0.56* 0.50* 0.69 0.69 0.68 0.65  CALCIUM  8.1* 8.1* 8.0* 8.3* 8.6* 8.5*  MG  --  2.5* 2.4  --  2.4  --   PHOS 3.1 2.9 2.8  --  3.1  --    GFR: Estimated Creatinine Clearance: 100.1 mL/min (by C-G formula based on SCr of 0.65 mg/dL). Recent Labs  Lab 12/22/24 0409 12/23/24 0440 12/27/24 0341  AST  --  17 21  ALT  --  32 25  ALKPHOS  --  145* 141*  BILITOT  --  0.4 0.4  PROT  --  5.7* 6.0*  ALBUMIN  2.5* 2.6* 2.6*   No results for input(s): LIPASE, AMYLASE in the last 168 hours. No results for input(s): AMMONIA in the last 168 hours. Coagulation Profile: No results for input(s): INR, PROTIME in the last 168 hours. Unresulted Labs (From  admission, onward)     Start     Ordered   12/16/24 0500  Phosphorus  (Standard TPN Labs (all labs to be drawn at 0500))  Every Mon,Thu (0500),   R     Question:  Specimen collection method  Answer:  IV Team=IV Team collect   12/15/24 0741   12/06/24 0500  Comprehensive metabolic panel  (TPN Lab Panel)  Every Mon,Thu (0500),   R     Question:  Specimen collection method  Answer:  IV Team=IV Team collect   12/04/24 1026   12/06/24 0500  Magnesium   (TPN Lab Panel)  Every Mon,Thu (0500),   R     Question:  Specimen collection method  Answer:  IV Team=IV Team collect   12/04/24 1026   12/06/24 0500  Triglycerides  (TPN Lab Panel)  Every Monday (0500),   R     Question:  Specimen collection method  Answer:  IV Team=IV Team collect   12/04/24 1026           Antimicrobials/Microbiology: Anti-infectives (From admission, onward)    Start     Dose/Rate Route Frequency Ordered Stop   12/11/24 0900  Ampicillin -Sulbactam (UNASYN ) 3 g in sodium chloride  0.9 % 100 mL IVPB        3 g 200 mL/hr over 30 Minutes Intravenous Every 6 hours 12/11/24 0817 12/18/24 0950   11/28/24 1200  Ampicillin -Sulbactam (UNASYN ) 3 g in sodium chloride  0.9 % 100 mL IVPB  Status:  Discontinued        3 g 200 mL/hr over 30 Minutes Intravenous Every 6 hours 11/28/24 1048 12/05/24 0909   11/28/24 0045  Ampicillin -Sulbactam (UNASYN ) 3 g in sodium chloride  0.9 % 100 mL IVPB        3 g 200 mL/hr over 30 Minutes Intravenous  Once 11/28/24 0042 11/28/24 0134         Component Value Date/Time   SDES  02/05/2024 1904    BLOOD BLOOD RIGHT ARM Performed at [facility], [address] [place], [place] [address]    [facility]  02/05/2024 1904    Blood Culture adequate volume BOTTLES DRAWN AEROBIC AND ANAEROBIC Performed at [facility], [address] [place], [place] [address]    CULT  02/05/2024 1904    NO GROWTH 5 DAYS Performed at [facility], [address],  [place], [place] [address]    REPTSTATUS 02/10/2024 FINAL 02/05/2024 1904    Procedures: Procedures (LRB): EGD (ESOPHAGOGASTRODUODENOSCOPY) (N/A) INSERTION, STENT, DUODENUM (N/A)   Mennie LAMY, MD [facility] 12/28/2024, 11:51 AM   "

## 2024-12-29 ENCOUNTER — Ambulatory Visit: Payer: Self-pay

## 2024-12-29 ENCOUNTER — Other Ambulatory Visit: Payer: Self-pay

## 2024-12-29 DIAGNOSIS — K920 Hematemesis: Secondary | ICD-10-CM | POA: Diagnosis not present

## 2024-12-29 LAB — CBC WITH DIFFERENTIAL/PLATELET
Abs Immature Granulocytes: 0.3 K/uL — ABNORMAL HIGH (ref 0.00–0.07)
Basophils Absolute: 0.1 K/uL (ref 0.0–0.1)
Basophils Relative: 2 %
Eosinophils Absolute: 0 K/uL (ref 0.0–0.5)
Eosinophils Relative: 1 %
HCT: 30.8 % — ABNORMAL LOW (ref 39.0–52.0)
Hemoglobin: 9.5 g/dL — ABNORMAL LOW (ref 13.0–17.0)
Immature Granulocytes: 9 %
Lymphocytes Relative: 13 %
Lymphs Abs: 0.4 K/uL — ABNORMAL LOW (ref 0.7–4.0)
MCH: 30.2 pg (ref 26.0–34.0)
MCHC: 30.8 g/dL (ref 30.0–36.0)
MCV: 97.8 fL (ref 80.0–100.0)
Monocytes Absolute: 0.3 K/uL (ref 0.1–1.0)
Monocytes Relative: 8 %
Neutro Abs: 2.1 K/uL (ref 1.7–7.7)
Neutrophils Relative %: 67 %
Platelets: 292 K/uL (ref 150–400)
RBC: 3.15 MIL/uL — ABNORMAL LOW (ref 4.22–5.81)
RDW: 17 % — ABNORMAL HIGH (ref 11.5–15.5)
Smear Review: NORMAL
WBC: 3.2 K/uL — ABNORMAL LOW (ref 4.0–10.5)
nRBC: 0.9 % — ABNORMAL HIGH (ref 0.0–0.2)

## 2024-12-29 LAB — RAD ONC ARIA SESSION SUMMARY
Course Elapsed Days: 12
Plan Fractions Treated to Date: 8
Plan Prescribed Dose Per Fraction: 3 Gy
Plan Total Fractions Prescribed: 9
Plan Total Prescribed Dose: 27 Gy
Reference Point Dosage Given to Date: 24 Gy
Reference Point Session Dosage Given: 3 Gy
Session Number: 9

## 2024-12-29 LAB — GLUCOSE, CAPILLARY
Glucose-Capillary: 114 mg/dL — ABNORMAL HIGH (ref 70–99)
Glucose-Capillary: 160 mg/dL — ABNORMAL HIGH (ref 70–99)
Glucose-Capillary: 179 mg/dL — ABNORMAL HIGH (ref 70–99)
Glucose-Capillary: 188 mg/dL — ABNORMAL HIGH (ref 70–99)

## 2024-12-29 MED ORDER — DEXAMETHASONE SOD PHOSPHATE PF 10 MG/ML IJ SOLN
10.0000 mg | Freq: Once | INTRAMUSCULAR | Status: AC
  Administered 2024-12-29: 10 mg via INTRAVENOUS
  Filled 2024-12-29: qty 1

## 2024-12-29 MED ORDER — SODIUM CHLORIDE 0.9 % IV SOLN
35.0000 mg/m2 | Freq: Once | INTRAVENOUS | Status: AC
  Administered 2024-12-29: 66 mg via INTRAVENOUS
  Filled 2024-12-29: qty 66

## 2024-12-29 MED ORDER — PALONOSETRON HCL INJECTION 0.25 MG/5ML
0.2500 mg | Freq: Once | INTRAVENOUS | Status: AC
  Administered 2024-12-29: 0.25 mg via INTRAVENOUS
  Filled 2024-12-29: qty 5

## 2024-12-29 MED ORDER — ENSURE PLUS HIGH PROTEIN PO LIQD
237.0000 mL | Freq: Two times a day (BID) | ORAL | Status: DC
  Administered 2024-12-29 – 2024-12-30 (×3): 237 mL via ORAL

## 2024-12-29 MED ORDER — DEXTROSE 10 % IV SOLN: INTRAVENOUS | Status: AC

## 2024-12-29 MED ORDER — TRAVASOL 10 % IV SOLN
INTRAVENOUS | Status: AC
  Filled 2024-12-29: qty 1188

## 2024-12-29 MED ORDER — HYALURONIDASE HUMAN 150 UNIT/ML IJ SOLN
150.0000 [IU] | Freq: Once | INTRAMUSCULAR | Status: AC
  Administered 2024-12-30: 150 [IU] via SUBCUTANEOUS
  Filled 2024-12-29: qty 1

## 2024-12-29 MED ORDER — SODIUM CHLORIDE 0.9 % IV SOLN
150.0000 mg | Freq: Once | INTRAVENOUS | Status: AC
  Administered 2024-12-29: 150 mg via INTRAVENOUS
  Filled 2024-12-29: qty 5

## 2024-12-29 NOTE — Progress Notes (Signed)
 IP PROGRESS NOTE  Subjective:   Mr. Fellner continues to have intermittent vomiting.  No hematemesis.  He had a bowel movement yesterday.  No pain.  He has not tried oral Phenergan .  No new complaint.  No neuropathy symptoms. Objective: Vital signs in last 24 hours: Blood pressure 129/86, pulse 82, temperature 97.6 F (36.4 C), temperature source Oral, resp. rate 19, height 6' (1.829 m), weight 158 lb 15.2 oz (72.1 kg), SpO2 100%.  Intake/Output from previous day: 01/13 0701 - 01/14 0700 In: 1154.9 [P.O.:30; I.V.:1024.9; IV Piggyback:100] Out: 200 [Emesis/NG output:200]  Physical Exam:  HEENT: No thrush Cardiac: Regular rate and rhythm Lungs: Decreased breath sounds at the right lower posterior chest, no respiratory distress  Abdomen: Soft, nontender, no hepatomegaly Extremities: No leg edema   Portacath/PICC-without erythema  Lab Results: Recent Labs    12/28/24 0409 12/29/24 0835  WBC 2.8* 3.2*  HGB 8.8* 9.5*  HCT 27.9* 30.8*  PLT 275 292      BMET Recent Labs    12/27/24 0341 12/28/24 0409  NA 143 143  K 3.9 4.1  CL 110 109  CO2 23 23  GLUCOSE 144* 187*  BUN 37* 33*  CREATININE 0.68 0.65  CALCIUM  8.6* 8.5*    Lab Results  Component Value Date   CEA 3.57 01/15/2024      Medications: I have reviewed the patient's current medications.  Assessment/Plan: Gastric cancer 11/21/2023 EGD-gastric body with infiltrative looking lesion; biopsy shows involvement of a hypercellular lesion that suggests diffuse carcinoma by morphology CTs abdomen/pelvis 11/28/2023-diffuse fatty infiltration of the liver; gastric wall thickening; lymph nodes up to 6 mm in diameter near the stomach wall. 12/31/2023 upper endoscopy-large diffuse friable infiltrative polypoid and ulcerated circumferential mass found in the gastric body.  Scope was passed beyond the mass with normal antrum/pylorus.  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  Negative for HER2 (1+); mismatch repair protein IHC normal; PD-L1 CPS 0%; CLDN18 positive: 90% of tumor cells with 2+/3+ membrane staining PET scan 01/07/2024-circumferential hypermetabolic gastric mucosal thickening.  Ill-defined hypermetabolic lymph nodes in the gastrohepatic ligament.  Intense hypermetabolic activity associated with the right lobe of the thyroid  gland.  Small hypermetabolic right axillary node favored reactive. 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  Gastric wall thickening slightly improved. Cycle 7 oxaliplatin , zolbetuximab 05/05/2024, Fulphila  Cycle 8 zolbetuximab 05/20/2024, oxaliplatin  held due to neuropathy, no Fulphila  Cycle 9 oxaliplatin /zolbetuximab 06/03/2024, Fulphila  Cycle 10 oxaliplatin /zolbetuximab 06/17/2024, Fulphila  Cycle 11 oxaliplatin /zolbetuximab 06/30/2024, Fulphila , oxaliplatin  dose reduced Cycle 12 zolbetuximab 07/15/2024, oxaliplatin  held secondary to neuropathy Cycle 13 zolbetuximab 07/29/2024, oxaliplatin  held secondary to neuropathy Cycle 14 zolbetuximab 08/26/2024, oxaliplatin  held secondary to neuropathy 09/01/2024 CTs-mild distal gastric wall thickening without discrete mass.  No metastatic disease. Cycle 15 zolbetuximab 09/09/2024, oxaliplatin  held due to neuropathy Cycle 16 zolbetuximab 09/30/2024, oxaliplatin  held due to  neuropathy Cycle 17 zolbetuximab 10/21/2024, oxaliplatin  held due to neuropathy 11/03/2024 CT abdomen/pelvis: Diffuse mild wall thickening of the distal gastric body and antrum-stable, mild increase in a right gastric lymph node small volume ascites without  omental nodularity, proximal jejunal mural thickening 11/04/2024 upper GI: Narrowing of the gastric antrum, normal gastric emptying, dilated duodenum and proximal jejunum without evidence of a bowel obstruction 11/18/2024 upper endoscopy: Tumor in the gastric body and antrum with antral narrowing, dilated duodenum without evidence of mass or obstruction, esophagitis: Biopsy of the gastric mass-poorly differentiated adenocarcinoma with focal signet ring morphology, esophagus biopsy: Squamous mucosa with focal ulceration Cycle 1 weekly cisplatin  11/26/2024 Cycle 2 weekly cisplatin  12/07/2024 Cycle 3 weekly cisplatin  12/15/2024 Radiation to the gastric mass 12/17/2024 Cycle 4 weekly cisplatin  12/22/2024 12/12/2024 CTs: Pneumomediastinum extending into the neck, lower lobe consolidation, esophagus distention, air in the bladder, new mildly enlarged right hilar node, new small volume ascites Cycle 5 weekly cisplatin  12/29/2024 Early satiety, postprandial abdominal pain, weight loss secondary to #1 History of bilateral lower extremity DVT 2021-anticoagulation discontinued due to massive retroperitoneal bleed, IVC filter placed 08/24/2020 Hospitalized with acute respiratory failure due to COVID-19 08/06/2020 - 09/04/2020 History of massive retroperitoneal bleed secondary to anticoagulation September 2021 Thyroid  ultrasound 01/13/2024-no abnormal nodule identified in the right lobe.  1.1 cm thyroid  isthmus nodule meets criteria for 1 year follow-up ultrasound. Admission 02/01/2024 with increased abdominal pain/ascites Hospitalization with altered mental status 02/05/2024 through 02/13/2024; question rare case of 5-FU psychosis.  Improved 02/16/2024.  Mental status at  baseline 02/23/2024. Neutropenia 02/16/2024 Mucositis 02/16/2024.  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  Persistent cold sensitivity 05/20/2024, oxaliplatin  held.  Cold sensitivity resolved 06/03/2024, oxaliplatin  resumed.  Mild loss of vibratory sense, oxaliplatin  dose reduced 06/29/2024 Admission 11/28/2024 with intractable nausea and vomiting 11/27/2024 CT Abdo/pelvis: Bilateral lower lobe aspiration/pneumonia, thick-walled gastric body/antrum, dilated proximal small bowel, small volume pelvic ascites 11/30/2024 EGD: Malignant appearing severe stenosis at the gastric antrum-stent placed, dilated duodenum with retained fluid 15.  Anemia secondary to GI bleeding 12/12/2024 2 units RBCs, 2 units RBCs 12/16/2024, 2 units RBCs 12/18/2024, 2 units RBCs 12/23/2024 IV iron  12/21/2024   Mr Plessinger has metastatic gastric cancer.  There is evidence of disease progression with persistent/progressive tumor in the stomach and intractable nausea/vomiting.  The omental disease on presentation last year remains significantly improved, by CT criteria.  He responded well to initial treatment with oxaliplatin  based chemotherapy.  He had neuropathy symptoms from oxaliplatin .  He had psychosis following 5-fluorouracil .  He began a trial of salvage therapy with cisplatin  on 11/26/2024.  He completed cycle 4 on 12/22/2024.  He began palliative radiation to the gastric mass on 12/17/2024.  He is now admitted with intractable nausea/vomiting, potentially related to a small bowel obstruction, ileus from the carcinomatosis, in addition to persistent tumor in the stomach.   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  There was retained fluid in the stomach and duodenum.  A gastric antral stent was placed.  He continues to have  nausea and vomiting of liquids despite placement of the gastric antral stent.  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  The nausea partially improved with octreotide .  Octreotide  was resumed on 12/04/2025.  He feels the octreotide  has helped the nausea.   He has intermittent hematemesis, likely secondary to the gastric mass.  GI does not recommend an endoscopy.  The hemoglobin is stable in the hematemesis has improved.  He received 2 units of packed red blood cells on 12/23/2024.  He began palliative radiation to the  gastric mass on 12/17/2024 and has completed 8 treatments to date.  Mr Greenfeld  developed CT evidence of progressive aspiration pneumonia.  He also had pneumomediastinum with subcutaneous emphysema extending to the neck on a CT 12/12/2024.  The significance of the emphysematous findings is unclear.  I reviewed the CT images.  He may have a tumor related microperforation or perforation related to emesis.  Mr Lupe has been evaluated by the palliative care service.  The family has met with the palliative care team. They understand the poor prognosis.   He confirmed his desire to continue treatment.  We submitted tissue for molecular testing to look for options for targeted treatment and assess the tumor mutation burden.  Results will be back during the week of 01/03/2025  I discussed his current status with Mr Mcgroarty and his family.  He will continue radiation.  He will complete a cycle of cisplatin  today.  He may be stable for discharge at the end of this week if the hematemesis has resolved and oral Phenergan  helps.  He can continue home TPN and we will add a long-acting somatostatin analog as an outpatient.  Recommendations: Continue TPN Continue Phenergan  and Ativan  as needed for nausea, continue antiacid therapy, begin trial of oral Phenergan  Continue palliative radiation to the gastric mass Packed red blood cells as needed for severe  anemia Follow-up results of NGS testing on the gastric mass to look for additional treatment options Out of bed, ambulate as tolerated Continue octreotide , convert to Sandostatin  as an outpatient Decrease frequency of CBGs 9.   Incentive spirometer 10.  Cycle 5 weekly cisplatin  today   LOS: 31 days   Arley Hof, MD   12/29/2024, 12:14 PM

## 2024-12-29 NOTE — TOC Progression Note (Addendum)
 Transition of Care [facility]) - Progression Note   Patient Details  Name: Henry May MRN: [id number] Date of Birth: 09-12-1966  [facility]) CM/SW Contact  Duwaine GORMAN Aran, LCSW Phone Number: 12/29/2024, 10:15 AM  Clinical Narrative: CSW followed up with Pam with [employer] regarding TPN. Patient's new insurance has a $10,000 deductible. [employer] to work with family regarding setting up TPN. Patient expected to be here through weekend. Care management to follow.  Addendum: CSW notified by [facility] that patient is tentatively scheduled with nursing for 01/04/25 in the afternoon.  Barriers to Discharge: Continued Medical Work up  Social Drivers of Health (SDOH) Interventions SDOH Screenings   Food Insecurity: Patient Declined (11/28/2024)  Housing: Unknown (11/28/2024)  Transportation Needs: Patient Declined (11/28/2024)  Utilities: Patient Declined (11/28/2024)  Depression (PHQ2-9): Medium Risk (11/26/2024)  Social Connections: Unknown (04/30/2022)   Received from [facility]  Tobacco Use: Medium Risk (11/30/2024)   Readmission Risk Interventions    12/06/2024   10:13 AM 02/07/2024    8:40 AM  Readmission Risk Prevention Plan  Transportation Screening Complete Complete  HRI or Home Care Consult Complete Complete  Social Work Consult for Recovery Care Planning/Counseling Complete Complete  Palliative Care Screening Not Applicable Not Applicable  Medication Review Oceanographer) Complete Complete

## 2024-12-29 NOTE — Progress Notes (Signed)
 Chemo RN presented to patient room. Patient verified using two separate identifiers. Consent for administration of cisplatin  previously obtained this admission. Pre-hydration and pre-medications administered. Patient tolerated cisplatin  infusion well, without any complications. Post-hydration started, will be taken down by primary RN.

## 2024-12-29 NOTE — Progress Notes (Signed)
 " PROGRESS NOTE    Henry May  FMW:[id number] DOB: 1966-09-19 DOA: 11/27/2024 PCP: [name], DO    Brief Narrative:   Henry May is a 59 y.o. male with past medical history significant for metastatic gastric cancer currently on chemotherapy/radiation (oncology, Dr. Cloretta), intractable nausea/vomiting due to progressive gastric cancer/omental disease, gastric antral stenosis s/p stent, intermittent hematemesis secondary to gastric mass, DVT s/p IVC filter who presented to [facility] on 12/13 with nausea/vomiting, coffee-ground emesis, weakness.  Patient underwent recent EGD 12/4 with LA grade D esophagitis without bleeding, large infiltrative sessile and ulcerated circumferential mass with contact of bleeding in the gastric body/antrum with antral narrowing and luminal diameter approxione 0.5 cm.  In the ED, temperature 98.5 F, HR 95, RR 23, BP 133/92, SpO2 93% on room air.  WBC 7.1, hemoglobin 14.5, platelet count 274.  Sodium 135, potassium 4.0, chloride 97, CO2 25, glucose 122, BUN 929, creatinine 0.83.  Lipase 171.  AST 25, ALT 62, total bilirubin 0.6.  High sensitive troponin less than 15.  INR 1.1.  CT abdomen/pelvis with contrast with dilated proximal small bowel suggesting small bowel obstruction with a discrete transition point not seen, thick walled gastric body/antrum with history of gastric cancer without definite focal mass, mild nodularity along the pylorus, scarring with superimposed patchy peribronchial vascular nodularity bilateral lower lobes right greater than left suggestive of mild aspiration/pneumonia.  Gastroenterology, general surgery, oncology consulted.  TRH consulted for admission and patient was transferred to [facility] for further evaluation management.  Patient underwent EGD with gastric stent placement on 12/16, unable to tolerate diet and started on TPN.  Plan to discharge on TPN but nausea and vomiting progress following stopping  of octreotide  and Decadron  with hemoptysis, restarted on octreotide  infusion.  CT chest/abdomen/pelvis with new patchy substernal pneumomediastinum of indeterminate source, patchy consolidation in lower lobes, posterior LUL, esophageal distention with retained/reflux fluid in thoracic inlet, emphysematous cystitis and circumferential gastric thickening with adjacent stranding change, antropyloric stenting and continued duodenal dilation, thickening of jejunal folds.  Ongoing bleeding felt secondary to metastatic gastric carcinoma.  Patient was continued on chemotherapy and started on XRT to decrease tumor burden and provide symptomatic relief.  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  CT abdomen/pelvis 12/13 with thick walled gastric body and antrum without a definitive mass with nodularity along the pylorus and proximal dilated small bowel loops, questionable mild obstruction without discrete transition point.  Underwent EGD with gastric stent placement on 12/16.  Seen by general surgery, no indication for surgical intervention at this time and signed off 12/19.  Developed hematemesis 12/25 with or without hemoptysis and octreotide  was restarted.  Repeat CT abdomen/pelvis 12/28 with circumferential gastric thickening and antropyloric stent with continued dilation of the duodenum 6 mL centimeters caliber without any appreciable transition, thickened jejunal folds consistent with nonspecific enteritis and small-volume ascites.  Seen by GI, limited role of endoscopy with possible malignant bleeding and potential retroperitoneal air tracking, signed off 1/2. -- Medical oncology, radiation oncology following, appreciate  assistance -- Continue radiation, planned 10 fractions; completion 1/15  -- Medical oncology to dose cycle 5 of weekly cisplatin  today -- Dilaudid  0.5 mg IV every 4 hours as needed severe pain -- Octreotide  100 mcg  BID > plan to change to  Sandostatin  on discharge per oncology -- Protonix  80 mg IV q12h --Carafate  1 g p.o. 3 times daily -- Phenergan  25 mg PO q6h PRN nausea/vomiting; 12.5 mg IV PRN if unable to tolerate oral -- Continue TPN, pharmacy consult for dosing/monitoring -- TOC following for discharge planning -- Palliative care continues to follow intermittently, overall prognosis remains poor/grim  Aspiration pneumonitis/pneumonia Pneumomediastinum noted on CT chest.  Completed 7-day course of Unasyn , completed on 12/17/2024.  Leukopenia Stable, continue to monitor closely on chemotherapy  Anemia Iron  deficiency -- Hgb 14.5>>6.7>8.8>>6.3>>9.7>>7.3>>9.1>>8.8>9.5 -- Transfused 8 unit PRBCs during his hospitalization, last 12/23/2024 -- Continue monitor hemoglobin intermittently, transfuse for hemoglobin less than 7.0  Type 2 diabetes mellitus Diet controlled, hemoglobin A1c 5.7%.  History of DVT S/p IVC filter  Elevated lipase -- Lipase 171>53  Severe malnutrition Body mass index is 21.56 kg/m. Nutrition Status: Nutrition Problem: Severe Malnutrition Etiology: chronic illness (gastric cancer) Signs/Symptoms: severe fat depletion, severe muscle depletion, percent weight loss (24% weight loss in less than 3 months) Percent weight loss: 24 % (in < 3 months) Interventions: Refer to RD note for recommendations -- Continue TPN, full liquid diet as tolerates   DVT prophylaxis: Place and maintain sequential compression device Start: 12/15/24 1907 SCDs Start: 11/28/24 1042    Code Status: Limited: Do not attempt resuscitation (DNR) -DNR-LIMITED -Do Not Intubate/DNI  Family Communication:   Disposition Plan:  Level of care: Progressive Status is: Inpatient Remains  inpatient appropriate because: TPN, needs to demonstrate toleration of oral Phenergan  in order to discharge home; needs home TPN set up    Consultants:  Medical oncology, Dr. Cloretta Radiation oncology General Surgery - signed off 12/19 [place] gastroenterology -signed off 1/2 Palliative care  Procedures:  EGD  Antimicrobials:  Unasyn  12/27 - 1/2   Subjective: Patient seen examined bedside, lying in bed.  Spouse present.  No specific complaints.  Underwent radiation this morning, to undergo cycle 5 of cisplatin  this afternoon.  Continues with intermittent nausea and vomiting.  Overall prognosis poor given advanced malignancy with poor response to treatment.  Discussed with Holley Herring, still trying to set up outpatient TPN.  Despite multiple agents, continues with nausea and vomiting.  No other specific questions or concerns at this time.  Denies headache, no visual changes, no chest pain, no palpitations, no shortness of breath, no abdominal pain, no fever/chills/night sweats, no diarrhea.   Objective: Vitals:   12/28/24 0518 12/28/24 1305 12/28/24 2056 12/29/24 0617  BP: (!) 130/90 120/82 121/81 129/86  Pulse: 95 89 84 82  Resp: 14 15 19 19   Temp: 98.1 F (36.7 C) 98.3 F (36.8 C) 97.8 F (36.6 C) 97.6 F (36.4 C)  TempSrc: Oral Oral Oral Oral  SpO2: 99% 98% 100% 100%  Weight:      Height:        Intake/Output Summary (Last 24 hours) at 12/29/2024 1344 Last data filed at 12/29/2024 1328 Gross per 24 hour  Intake 2470.93 ml  Output 1325 ml  Net 1145.93 ml   Filed Weights   12/28/24 0500  Weight: 72.1 kg    Examination:  Physical Exam: GEN: NAD, alert and oriented x 3, chronically ill in appearance, appears older than stated age HEENT: NCAT, PERRL, EOMI, sclera clear PULM: CTAB w/o wheezes/crackles, normal respiratory effort, on room air with SpO2 100% at rest CV: RRR w/o M/G/R GI: abd soft, NTND, + BS MSK: no peripheral edema NEURO: No focal neurological  deficit PSYCH: normal mood/affect Integumentary: No concerning rashes/lesions/wounds noted on exposed  skin surfaces    Data Reviewed: I have personally reviewed following labs and imaging studies  CBC: Recent Labs  Lab 12/23/24 0440 12/23/24 2232 12/24/24 0424 12/25/24 0419 12/27/24 0341 12/28/24 0409 12/29/24 0835  WBC 3.1*  --  3.2* 3.3* 2.6* 2.8* 3.2*  NEUTROABS 2.2  --  2.6 2.8  --   --  2.1  HGB 7.3*   < > 10.3* 9.1* 9.0* 8.8* 9.5*  HCT 23.0*   < > 30.1* 28.2* 28.4* 27.9* 30.8*  MCV 92.0  --  89.6 91.6 95.6 96.2 97.8  PLT 284  --  270 249 264 275 292   < > = values in this interval not displayed.   Basic Metabolic Panel: Recent Labs  Lab 12/23/24 0440 12/25/24 0419 12/26/24 0352 12/27/24 0341 12/28/24 0409  NA 136 143 143 143 143  K 4.5 3.8 3.8 3.9 4.1  CL 104 110 110 110 109  CO2 24 22 23 23 23   GLUCOSE 139* 140* 125* 144* 187*  BUN 21* 41* 41* 37* 33*  CREATININE 0.50* 0.69 0.69 0.68 0.65  CALCIUM  8.1* 8.0* 8.3* 8.6* 8.5*  MG 2.5* 2.4  --  2.4 2.3  PHOS 2.9 2.8  --  3.1  --    GFR: Estimated Creatinine Clearance: 100.1 mL/min (by C-G formula based on SCr of 0.65 mg/dL). Liver Function Tests: Recent Labs  Lab 12/23/24 0440 12/27/24 0341  AST 17 21  ALT 32 25  ALKPHOS 145* 141*  BILITOT 0.4 0.4  PROT 5.7* 6.0*  ALBUMIN  2.6* 2.6*   No results for input(s): LIPASE, AMYLASE in the last 168 hours. No results for input(s): AMMONIA in the last 168 hours. Coagulation Profile: No results for input(s): INR, PROTIME in the last 168 hours. Cardiac Enzymes: No results for input(s): CKTOTAL, CKMB, CKMBINDEX, TROPONINI in the last 168 hours. BNP (last 3 results) Recent Labs    12/11/24 1208  PROBNP 244.0   HbA1C: No results for input(s): HGBA1C in the last 72 hours. CBG: Recent Labs  Lab 12/28/24 1112 12/28/24 1731 12/28/24 2354 12/29/24 0614 12/29/24 1119  GLUCAP 94 90 137* 160* 114*   Lipid Profile: Recent Labs     12/27/24 0341  TRIG 124   Thyroid  Function Tests: No results for input(s): TSH, T4TOTAL, FREET4, T3FREE, THYROIDAB in the last 72 hours. Anemia Panel: No results for input(s): VITAMINB12, FOLATE, FERRITIN, TIBC, IRON , RETICCTPCT in the last 72 hours. Sepsis Labs: No results for input(s): PROCALCITON, LATICACIDVEN in the last 168 hours.  No results found for this or any previous visit (from the past 240 hours).       Radiology Studies: No results found.      Scheduled Meds:  Chlorhexidine  Gluconate Cloth  6 each Topical Daily   feeding supplement  237 mL Oral BID BM   insulin  aspart  0-15 Units Subcutaneous Q6H   octreotide   100 mcg Subcutaneous BID   mouth rinse  15 mL Mouth Rinse 4 times per day   pantoprazole  (PROTONIX ) IV  80 mg Intravenous Q12H   sucralfate   1 g Oral TID   Continuous Infusions:  dextrose  90 mL/hr at 12/29/24 1002   promethazine  (PHENERGAN ) injection (IM or IVPB) 12.5 mg (12/29/24 0423)   TPN ADULT (ION)       LOS: 31 days    Time spent: 53 minutes spent on 12/29/2024 caring for this patient face-to-face including chart review, ordering labs/tests, documenting, discussion with nursing staff, consultants, updating family and interview/physical exam  Camellia PARAS Hollyn Stucky, DO [facility] Available via Epic secure chat 7am-7pm After these hours, please refer to coverage provider listed on [email or web address] 12/29/2024, 1:44 PM   "

## 2024-12-29 NOTE — Progress Notes (Addendum)
 PHARMACY - TOTAL PARENTERAL NUTRITION CONSULT NOTE   Indication: intolerance to enteral feeds  Patient Measurements: Height: 6' (182.9 cm) Weight: 72.1 kg (158 lb 15.2 oz) IBW/kg (Calculated) : 77.6 TPN AdjBW (KG): 70 Body mass index is 21.56 kg/m.  Assessment: 18 yoM admitted on 12/13 with intractable nausea and vomiting, dilated small bowel loops, and coffee-ground emesis. 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 He underwent EGD 12/16 showing esophagitis, gastric stenosis, stent placed, impaired peristalsis. Pharmacy was consulted to dose TPN on 12/20 for intolerance to enteral feeding.    - Octreotide  resumed (as 100 mcg BID injections) on 1/12  Glucose / Insulin : No hx DM; A1c 5.7% in Dec - CBGs back up after starting octreotide  but improved today; no low CBGs while off TPN   - 5 units SSI given yesterday - Decadron  ordered with planned chemo today, expect CBGs to further increase a result.  Electrolytes: WNL & stable Renal: SCr stable at baseline (<1); BUN elevated but trended down yesterday - UOP low, but does not appear to be fully charted Hepatic: WNL, alk phos minimally elevated; albumin  remains low but stable (12/27/24) - TG WNL1/12 Intake / Output:  - Emesis: -200 ml/24 hrs - UOP: -500 ml/24 hrs - LBM 1/12 GI Imaging: - 12/18 AXR: No evidence of obstruction; 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; dilated duodenum - 12/25 AXR: nonobstructive bowel gas pattern - 12/27 AXR: unchanged - 12/28 CT: nonspecific esophageal distention, enteritis, new small volume of ascites  GI Surgeries / Procedures:  -12/16 EGD: esophagitis  GI meds:   - Protonix  80 mg IV BID (increased 1/8) - Sucralfate  1g PO TID - Octreotide  100 mg SQ BID - PRN phenergan   Central access: Implanted Port (01/22/24) TPN start date: 12/20   Nutritional Goals: Cyclic TPN, total volume 2160 mL, provides 119 g of protein and 2298 kcals per day  RD Assessment: Estimated Needs Total  Energy Estimated Needs: 2100-2450 kcals Total Protein Estimated Needs: 105-120 grams Total Fluid Estimated Needs: >/= 2.1L  Current Nutrition:  FLD starting 1/11 TPN  Plan:  Given resumption of octreotide  (with expected dysregulation of blood glucose), as well as GI issues precluding meaningful advancement in diet or imminent discharge, the risks of cyclic TPN outweigh the benefits. Will proceed with continuous TPN. For discharge planning, please note that at least 2-3 days AFTER becoming stable on continuous TPN are required to fully transition patients to cyclic TPN. Continue TPN at 90 ml/hr Electrolytes in TPN: Patient getting chemo today - will receive an additional 20 mEq K and 2 g Mg Na 75 mEq/L K 40 mEq/L Ca 2 mEq/L Mg 5 mEq/L Phos 20 mmol/L Cl:Ac 1:2 Add standard MVI and trace elements to TPN Chromium remains on hold d/t national shortage Continue mSSI q6h given likely rise in CBGs with Decadron  administration today; would reduce CBG frequency as soon as stable to minimize finger sticks Maintenance IV fluids per MD (none currently) TPN labs on Mon/Thurs   ADDENDUM: Message sent from RN today: Transport was wheeling pt to go to radiation and the IV poll hit a door and the TPN line snapped in half. We flushed line and sent pt to radiation. When he gets back, he will need a new setup and bag of TPN. There was left in the TPN bag. IV team is aware.  Will not be able to get another TPN bag from [facility] until this evening and cannot reconnect current TPN bag due to infection concerns.  Discussed with MD and will start D10 infusion @90ml /hr (same rate as TPN) until new TPN bag is started this evening.    Lacinda Moats, PharmD Clinical Pharmacist  1/14/20268:14 AM

## 2024-12-30 ENCOUNTER — Other Ambulatory Visit: Payer: Self-pay

## 2024-12-30 ENCOUNTER — Ambulatory Visit: Payer: Self-pay

## 2024-12-30 DIAGNOSIS — K920 Hematemesis: Secondary | ICD-10-CM | POA: Diagnosis not present

## 2024-12-30 LAB — COMPREHENSIVE METABOLIC PANEL WITH GFR
ALT: 51 U/L — ABNORMAL HIGH (ref 0–44)
AST: 25 U/L (ref 15–41)
Albumin: 2.8 g/dL — ABNORMAL LOW (ref 3.5–5.0)
Alkaline Phosphatase: 192 U/L — ABNORMAL HIGH (ref 38–126)
Anion gap: 8 (ref 5–15)
BUN: 30 mg/dL — ABNORMAL HIGH (ref 6–20)
CO2: 25 mmol/L (ref 22–32)
Calcium: 8.6 mg/dL — ABNORMAL LOW (ref 8.9–10.3)
Chloride: 111 mmol/L (ref 98–111)
Creatinine, Ser: 0.59 mg/dL — ABNORMAL LOW (ref 0.61–1.24)
GFR, Estimated: >60 mL/min
Glucose, Bld: 173 mg/dL — ABNORMAL HIGH (ref 70–99)
Potassium: 4.2 mmol/L (ref 3.5–5.1)
Sodium: 144 mmol/L (ref 135–145)
Total Bilirubin: 0.3 mg/dL (ref 0.0–1.2)
Total Protein: 6.5 g/dL (ref 6.5–8.1)

## 2024-12-30 LAB — CBC
HCT: 29.2 % — ABNORMAL LOW (ref 39.0–52.0)
Hemoglobin: 8.9 g/dL — ABNORMAL LOW (ref 13.0–17.0)
MCH: 29.8 pg (ref 26.0–34.0)
MCHC: 30.5 g/dL (ref 30.0–36.0)
MCV: 97.7 fL (ref 80.0–100.0)
Platelets: 296 K/uL (ref 150–400)
RBC: 2.99 MIL/uL — ABNORMAL LOW (ref 4.22–5.81)
RDW: 16.5 % — ABNORMAL HIGH (ref 11.5–15.5)
WBC: 4.3 K/uL (ref 4.0–10.5)
nRBC: 0.5 % — ABNORMAL HIGH (ref 0.0–0.2)

## 2024-12-30 LAB — RAD ONC ARIA SESSION SUMMARY
Course Elapsed Days: 13
Plan Fractions Treated to Date: 9
Plan Prescribed Dose Per Fraction: 3 Gy
Plan Total Fractions Prescribed: 9
Plan Total Prescribed Dose: 27 Gy
Reference Point Dosage Given to Date: 27 Gy
Reference Point Session Dosage Given: 3 Gy
Session Number: 10

## 2024-12-30 LAB — GLUCOSE, CAPILLARY
Glucose-Capillary: 127 mg/dL — ABNORMAL HIGH (ref 70–99)
Glucose-Capillary: 160 mg/dL — ABNORMAL HIGH (ref 70–99)
Glucose-Capillary: 118 mg/dL — ABNORMAL HIGH (ref 70–99)
Glucose-Capillary: 171 mg/dL — ABNORMAL HIGH (ref 70–99)

## 2024-12-30 LAB — PHOSPHORUS: Phosphorus: 3.1 mg/dL (ref 2.5–4.6)

## 2024-12-30 LAB — MAGNESIUM: Magnesium: 2.4 mg/dL (ref 1.7–2.4)

## 2024-12-30 MED ORDER — TRAVASOL 10 % IV SOLN
INTRAVENOUS | Status: AC
  Filled 2024-12-30: qty 1188

## 2024-12-30 MED ORDER — KATE FARMS STANDARD 1.4 PO LIQD
325.0000 mL | Freq: Two times a day (BID) | ORAL | Status: DC
  Administered 2024-12-30 – 2025-01-05 (×8): 325 mL via ORAL
  Filled 2024-12-30 (×13): qty 325

## 2024-12-30 NOTE — Progress Notes (Signed)
 " PROGRESS NOTE    Henry May  FMW:[id number] DOB: 05/14/1966 DOA: 11/27/2024 PCP: [name], DO    Brief Narrative:   Henry May is a 59 y.o. male with past medical history significant for metastatic gastric cancer currently on chemotherapy/radiation (oncology, Dr. Cloretta), intractable nausea/vomiting due to progressive gastric cancer/omental disease, gastric antral stenosis s/p stent, intermittent hematemesis secondary to gastric mass, DVT s/p IVC filter who presented to [facility] on 12/13 with nausea/vomiting, coffee-ground emesis, weakness.  Patient underwent recent EGD 12/4 with LA grade D esophagitis without bleeding, large infiltrative sessile and ulcerated circumferential mass with contact of bleeding in the gastric body/antrum with antral narrowing and luminal diameter approxione 0.5 cm.  In the ED, temperature 98.5 F, HR 95, RR 23, BP 133/92, SpO2 93% on room air.  WBC 7.1, hemoglobin 14.5, platelet count 274.  Sodium 135, potassium 4.0, chloride 97, CO2 25, glucose 122, BUN 929, creatinine 0.83.  Lipase 171.  AST 25, ALT 62, total bilirubin 0.6.  High sensitive troponin less than 15.  INR 1.1.  CT abdomen/pelvis with contrast with dilated proximal small bowel suggesting small bowel obstruction with a discrete transition point not seen, thick walled gastric body/antrum with history of gastric cancer without definite focal mass, mild nodularity along the pylorus, scarring with superimposed patchy peribronchial vascular nodularity bilateral lower lobes right greater than left suggestive of mild aspiration/pneumonia.  Gastroenterology, general surgery, oncology consulted.  TRH consulted for admission and patient was transferred to [facility] for further evaluation management.  Patient underwent EGD with gastric stent placement on 12/16, unable to tolerate diet and started on TPN.  Plan to discharge on TPN but nausea and vomiting progress following stopping  of octreotide  and Decadron  with hemoptysis, restarted on octreotide  infusion.  CT chest/abdomen/pelvis with new patchy substernal pneumomediastinum of indeterminate source, patchy consolidation in lower lobes, posterior LUL, esophageal distention with retained/reflux fluid in thoracic inlet, emphysematous cystitis and circumferential gastric thickening with adjacent stranding change, antropyloric stenting and continued duodenal dilation, thickening of jejunal folds.  Ongoing bleeding felt secondary to metastatic gastric carcinoma.  Patient was continued on chemotherapy and started on XRT to decrease tumor burden and provide symptomatic relief.  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  CT abdomen/pelvis 12/13 with thick walled gastric body and antrum without a definitive mass with nodularity along the pylorus and proximal dilated small bowel loops, questionable mild obstruction without discrete transition point.  Underwent EGD with gastric stent placement on 12/16.  Seen by general surgery, no indication for surgical intervention at this time and signed off 12/19.  Developed hematemesis 12/25 with or without hemoptysis and octreotide  was restarted.  Repeat CT abdomen/pelvis 12/28 with circumferential gastric thickening and antropyloric stent with continued dilation of the duodenum 6 mL centimeters caliber without any appreciable transition, thickened jejunal folds consistent with nonspecific enteritis and small-volume ascites.  Seen by GI, limited role of endoscopy with possible malignant bleeding and potential retroperitoneal air tracking, signed off 1/2. -- Medical oncology, radiation oncology following, appreciate  assistance -- Completed 10 fractions of radiation 12/30/2024. -- Received cycle 5 of weekly cisplatin  12/29/2024 -- Dilaudid  0.5 mg IV every 4 hours as needed severe pain -- Octreotide  100 mcg [place] BID > plan to change to Sandostatin  on discharge per oncology --  Protonix  80 mg IV q12h -- Carafate  1 g p.o. 3 times daily -- Phenergan  12.5 mg IV PRN nausea/vomiting -- Continue TPN, pharmacy consult for dosing/monitoring -- TOC following for discharge planning -- Palliative care continues to follow intermittently, overall prognosis remains poor/grim; would recommend transitioning to hospice care  Aspiration pneumonitis/pneumonia Pneumomediastinum noted on CT chest.  Completed 7-day course of Unasyn , completed on 12/17/2024.  Leukopenia Stable, continue to monitor closely on chemotherapy  Anemia Iron  deficiency -- Hgb 14.5>>6.7>8.8>>6.3>>9.7>>7.3>>9.1>>8.8>9.5>8.9 -- Transfused 8 unit PRBCs during his hospitalization, last 12/23/2024 -- Continue monitor hemoglobin intermittently, transfuse for hemoglobin less than 7.0  Type 2 diabetes mellitus Diet controlled, hemoglobin A1c 5.7%.  History of DVT S/p IVC filter  Elevated lipase -- Lipase 171>53  Severe malnutrition Body mass index is 21.56 kg/m. Nutrition Status: Nutrition Problem: Severe Malnutrition Etiology: chronic illness (gastric cancer) Signs/Symptoms: severe fat depletion, severe muscle depletion, percent weight loss (24% weight loss in less than 3 months) Percent weight loss: 24 % (in < 3 months) Interventions: Refer to RD note for recommendations -- Continue TPN, full liquid diet as tolerates   DVT prophylaxis: Place and maintain sequential compression device Start: 12/15/24 1907 SCDs Start: 11/28/24 1042    Code Status: Limited: Do not attempt resuscitation (DNR) -DNR-LIMITED -Do Not Intubate/DNI  Family Communication:   Disposition Plan:  Level of care: Progressive Status is: Inpatient Remains inpatient  appropriate because: TPN, needs home TPN set up; overall poor/grim prognosis with recommendation of transitioning to hospice care as he continues with significant nausea and vomiting despite aggressive treatment with antiemetics    Consultants:  Medical oncology, Dr. Cloretta Radiation oncology General Surgery - signed off 12/19 [facility] [place] [facility] -signed off 1/2 Palliative care  Procedures:  EGD  Antimicrobials:  Unasyn  12/27 - 1/2   Subjective: Patient seen examined bedside, lying in bed.  Spouse and daughter present at bedside.  Completed radiation today.  Underwent cycle 5 of cisplatin  yesterday.  Attempted to try oral Phenergan  yesterday, failed with recurrence of nausea and vomiting x 5 episodes reported.  Feels flushed, redness to face.  Unclear how patient will tolerate at home and will need TPN set up.  Oncology asking pharmacy to add Phenergan  to the TPN.  No other specific questions or concerns at this time.  Denies headache, no visual changes, no chest pain, no palpitations, no shortness of breath, no abdominal pain, no fever/chills/night sweats, no diarrhea. Overall prognosis poor given advanced malignancy with poor response to treatment.   Objective: Vitals:   12/29/24 1425 12/29/24 2100 12/30/24 0157 12/30/24 1319  BP: 131/78 139/78 113/67 122/79  Pulse: 75  87 92  Resp:   (!) 22 20  Temp: 98.2 F (36.8 C) 98.9 F (37.2 C) 97.7 F (36.5 C) 98.7 F (37.1 C)  TempSrc: Oral Oral Oral Oral  SpO2: 98% 100% 97% 99%  Weight:      Height:        Intake/Output Summary (Last 24 hours) at 12/30/2024 1701 Last data filed at 12/30/2024 1525 Gross per 24 hour  Intake 1530.04 ml  Output 1075 ml  Net 455.04 ml   Filed Weights   12/28/24 0500  Weight: 72.1 kg    Examination:  Physical Exam: GEN: NAD, alert and oriented x 3, chronically ill in appearance, appears older than stated age HEENT: NCAT, PERRL, EOMI, sclera clear PULM: CTAB w/o wheezes/crackles, normal  respiratory effort, on room air with SpO2 99% at rest CV: RRR w/o M/G/R GI: abd soft, NTND, + BS  MSK: no peripheral edema NEURO: No focal neurological deficit PSYCH: normal mood/affect Integumentary: No concerning rashes/lesions/wounds noted on exposed skin surfaces    Data Reviewed: I have personally reviewed following labs and imaging studies  CBC: Recent Labs  Lab 12/24/24 0424 12/25/24 0419 12/27/24 0341 12/28/24 0409 12/29/24 0835 12/30/24 0415  WBC 3.2* 3.3* 2.6* 2.8* 3.2* 4.3  NEUTROABS 2.6 2.8  --   --  2.1  --   HGB 10.3* 9.1* 9.0* 8.8* 9.5* 8.9*  HCT 30.1* 28.2* 28.4* 27.9* 30.8* 29.2*  MCV 89.6 91.6 95.6 96.2 97.8 97.7  PLT 270 249 264 275 292 296   Basic Metabolic Panel: Recent Labs  Lab 12/25/24 0419 12/26/24 0352 12/27/24 0341 12/28/24 0409 12/30/24 0415  NA 143 143 143 143 144  K 3.8 3.8 3.9 4.1 4.2  CL 110 110 110 109 111  CO2 22 23 23 23 25   GLUCOSE 140* 125* 144* 187* 173*  BUN 41* 41* 37* 33* 30*  CREATININE 0.69 0.69 0.68 0.65 0.59*  CALCIUM  8.0* 8.3* 8.6* 8.5* 8.6*  MG 2.4  --  2.4 2.3 2.4  PHOS 2.8  --  3.1  --  3.1   GFR: Estimated Creatinine Clearance: 100.1 mL/min (A) (by C-G formula based on SCr of 0.59 mg/dL (L)). Liver Function Tests: Recent Labs  Lab 12/27/24 0341 12/30/24 0415  AST 21 25  ALT 25 51*  ALKPHOS 141* 192*  BILITOT 0.4 0.3  PROT 6.0* 6.5  ALBUMIN  2.6* 2.8*   No results for input(s): LIPASE, AMYLASE in the last 168 hours. No results for input(s): AMMONIA in the last 168 hours. Coagulation Profile: No results for input(s): INR, PROTIME in the last 168 hours. Cardiac Enzymes: No results for input(s): CKTOTAL, CKMB, CKMBINDEX, TROPONINI in the last 168 hours. BNP (last 3 results) Recent Labs    12/11/24 1208  PROBNP 244.0   HbA1C: No results for input(s): HGBA1C in the last 72 hours. CBG: Recent Labs  Lab 12/29/24 1119 12/29/24 1740 12/29/24 2341 12/30/24 0621 12/30/24 1203   GLUCAP 114* 188* 179* 127* 160*   Lipid Profile: No results for input(s): CHOL, HDL, LDLCALC, TRIG, CHOLHDL, LDLDIRECT in the last 72 hours.  Thyroid  Function Tests: No results for input(s): TSH, T4TOTAL, FREET4, T3FREE, THYROIDAB in the last 72 hours. Anemia Panel: No results for input(s): VITAMINB12, FOLATE, FERRITIN, TIBC, IRON , RETICCTPCT in the last 72 hours. Sepsis Labs: No results for input(s): PROCALCITON, LATICACIDVEN in the last 168 hours.  No results found for this or any previous visit (from the past 240 hours).       Radiology Studies: No results found.      Scheduled Meds:  Chlorhexidine  Gluconate Cloth  6 each Topical Daily   feeding supplement ([place] STANDARD 1.4) Liquid  325 mL Oral BID BM   insulin  aspart  0-15 Units Subcutaneous Q6H   octreotide   100 mcg Subcutaneous BID   mouth rinse  15 mL Mouth Rinse 4 times per day   pantoprazole  (PROTONIX ) IV  80 mg Intravenous Q12H   sucralfate   1 g Oral TID   Continuous Infusions:  promethazine  (PHENERGAN ) injection (IM or IVPB) 12.5 mg (12/30/24 1516)   TPN ADULT (ION) 90 mL/hr at 12/29/24 1733   TPN ADULT (ION)       LOS: 32 days    Time spent: 49 minutes spent on 12/30/2024 caring for this patient face-to-face including chart review, ordering labs/tests, documenting, discussion with nursing staff, consultants, updating family and interview/physical exam  Camellia PARAS Marico Buckle, DO [facility] Available via Epic secure chat 7am-7pm After these hours, please refer to coverage provider listed on [email or web address] 12/30/2024, 5:01 PM   "

## 2024-12-30 NOTE — TOC Progression Note (Signed)
 Transition of Care [facility]) - Progression Note   Patient Details  Name: Henry May MRN: [id number] Date of Birth: [birth year]  [facility] of [facility]) CM/SW Contact  Duwaine GORMAN Aran, LCSW Phone Number: 12/30/2024, 12:33 PM  Clinical Narrative: CSW spoke with spouse regarding questions about insurance.  Barriers to Discharge: Continued Medical Work up  Social Drivers of Health (SDOH) Interventions SDOH Screenings   Food Insecurity: Patient Declined (11/28/2024)  Housing: Unknown (11/28/2024)  Transportation Needs: Patient Declined (11/28/2024)  Utilities: Patient Declined (11/28/2024)  Depression (PHQ2-9): Medium Risk (11/26/2024)  Social Connections: Unknown (04/30/2022)   Received from [facility]  Tobacco Use: Medium Risk (11/30/2024)   Readmission Risk Interventions    12/06/2024   10:13 AM 02/07/2024    8:40 AM  Readmission Risk Prevention Plan  Transportation Screening Complete Complete  HRI or Home Care Consult Complete Complete  Social Work Consult for Recovery Care Planning/Counseling Complete Complete  Palliative Care Screening Not Applicable Not Applicable  Medication Review [employer]) Complete Complete

## 2024-12-30 NOTE — Progress Notes (Signed)
 Nutrition Follow-up  DOCUMENTATION CODES:   Severe malnutrition in context of chronic illness  INTERVENTION:  - TPN remains at goal, meeting 100% of estimated needs.             - TPN management per pharmacy.   - Full Liquid diet per MD.  [place] [employer] 1.4 PO BID, each supplement provides 455 kcal and 20 grams protein.   - Daily weights while on TPN.  NUTRITION DIAGNOSIS:   Severe Malnutrition related to chronic illness (gastric cancer) as evidenced by severe fat depletion, severe muscle depletion, percent weight loss (24% weight loss in less than 3 months). *ongoing  GOAL:   Patient will meet greater than or equal to 90% of their needs *met with TPN  MONITOR:   Diet advancement, Labs, Weight trends  REASON FOR ASSESSMENT:   Malnutrition Screening Tool, NPO/Clear Liquid Diet (NPO x3 days)    ASSESSMENT:   59 y.o. male with PMH of gastric cancer on chemo and recent esophagitis who presented due to nausea, vomiting and coffee-ground emesis 2 days after he started new chemotherapy. Admitted for intractable nausea and vomiting, dilated small bowel loops with concern for partial SBO, and coffee-ground emesis.  12/13 Admit; NPO  12/16 s/p upper GI endoscopy with gastric stent placement and NGT placement; CLD 12/17 NPO 12/18 CLD  12/19 FLD 12/20 CLD; TPN initiated 12/21 NGT came out 12/22 NPO; TPN increased to goal 12/23 TPN changed to cycle x18 hours 12/24 TPN changed to cycle x12 hours 12/27 TPN changed to cycle x18 hours due to lack of CBG control (pharmacy suspects from Octreotide  infusion) 1/1 TPN changed back to 24 hour continuous infusion 1/9 TPN changed to cycle x18 hours 1/9 TPN changed to cycle x16 hours; CLD 1/11 FLD 1/13 TPN changed back to 24 hour continuous infusion due to resumption of Octreotide   Patient in bed at time of visit, TPN infusing at goal of 60mL/hr. Daughter at bedside.  They report TPN continues to go well. Also report patient is now on a  full liquid diet. Has been taking in small quantities of liquids however daughter notes it still tends to come back up. He had Ensure added back once diet advanced and has been accepting them. Daughter notes he has been taking in small sips of them. Offered Gift [employer] for a plant based option as per previous discussions patient doesn't tend to do well with lactose. Patient and daughter agreeable.     Admit weight: 154# Current weight: 158# I&O's: +22.2L since 1/1   Medications reviewed and include: Protonix , Carafate , Phenergan  Q4H, Octreotide     Labs reviewed:  Triglycerides 124 (as of 1/12) Blood Glucose: 114-179 x24 hours  Diet Order:   Diet Order             Diet full liquid Room service appropriate? Yes; Fluid consistency: Thin  Diet effective now                   EDUCATION NEEDS:  Education needs have been addressed  Skin:  Skin Assessment: Reviewed RN Assessment  Last BM:  1/12  Height:  Ht Readings from Last 1 Encounters:  11/30/24 6' (1.829 m)   Weight:  Wt Readings from Last 1 Encounters:  12/28/24 72.1 kg   Ideal Body Weight:  80.91 kg  BMI:  Body mass index is 21.56 kg/m.  Estimated Nutritional Needs:  Kcal:  2100-2450 kcals Protein:  105-120 grams Fluid:  >/= 2.1L    Trude Ned RD,  LDN Contact via Secure Chat.

## 2024-12-30 NOTE — Progress Notes (Signed)
 PHARMACY - TOTAL PARENTERAL NUTRITION CONSULT NOTE   Indication: intolerance to enteral feeds  Patient Measurements: Height: 6' (182.9 cm) Weight: 72.1 kg (158 lb 15.2 oz) IBW/kg (Calculated) : 77.6 TPN AdjBW (KG): 70 Body mass index is 21.56 kg/m.  Assessment: 81 yoM admitted on 12/13 with intractable nausea and vomiting, dilated small bowel loops, and coffee-ground emesis. 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 He underwent EGD 12/16 showing esophagitis, gastric stenosis, stent placed, impaired peristalsis. Pharmacy was consulted to dose TPN on 12/20 for intolerance to enteral feeding.    - 1/12 Octreotide  resumed (as 100 mcg BID injections) - 1/14 TPN line broke during transport around 10a; D10 hung until new TPN started ~6p  Glucose / Insulin : No hx DM; A1c 5.7% in Dec - CBGs remained < 200 with no lows despite Decadron  x 1 with chemo yesterday - 8 units mSSI given yesterday Electrolytes: WNL & stable Renal: SCr stable at baseline (<1); BUN elevated but continues to trend down - UOP adequate; up from previously, likely d/t hydration orders with cisplatin  Hepatic: Alk Phos and ALT slightly elevated and trending up; albumin  remains low but stable (12/27/24); otherwise WNL - TG WNL1/12 Intake / Output: minimal PO intake d/t continued nausea, although both Ensures charted as given yesterday; continues to have intermittent vomiting; no drains or recent diuretics - Emesis: 450 ml yesterday - LBM 1/12 GI Imaging: - 12/18 AXR: No evidence of obstruction; 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; dilated duodenum - 12/25 AXR: nonobstructive bowel gas pattern - 12/27 AXR: unchanged - 12/28 CT: nonspecific esophageal distention, enteritis, new small volume of ascites - 1/8 AXR: nonobstructive bowel gas pattern, otherwise unchanged GI Surgeries / Procedures:  -12/16 EGD: esophagitis  GI meds:   - Protonix  80 mg IV BID (increased 1/8) - Sucralfate  1g PO TID - Octreotide   100 mg SQ BID - PRN phenergan   Central access: Implanted Port (01/22/24) TPN start date: 12/20   Nutritional Goals: TPN at 90 ml/hr provides 119 g of protein and 2298 kcals per day  RD Assessment: Estimated Needs Total Energy Estimated Needs: 2100-2450 kcals Total Protein Estimated Needs: 105-120 grams Total Fluid Estimated Needs: >/= 2.1L  Current Nutrition:  FLD starting 1/11 Ensure Plus HP BID TPN  Plan:  Continue TPN at 90 ml/hr For discharge planning, please note that at least 2-3 days AFTER becoming stable on continuous TPN are required to fully transition patients to cyclic TPN. Electrolytes in TPN: no changes Na 75 mEq/L K 40 mEq/L Ca 2 mEq/L Mg 5 mEq/L Phos 20 mmol/L Cl:Ac 1:2 Add standard MVI and trace elements to TPN Chromium remains on hold d/t national shortage Continue mSSI q6h given likely rise in CBGs with Decadron  administration today; would reduce CBG frequency as soon as stable to minimize finger sticks Maintenance IV fluids per MD (none currently) TPN labs on Mon/Thurs Consider rechecking BMP on 1/17 (Sat)   Bard Jeans, PharmD, BCPS [phone] 12/30/2024, 7:30 AM

## 2024-12-30 NOTE — Progress Notes (Signed)
 IP PROGRESS NOTE  Subjective:   Henry. Henry May continues to have intermittent vomiting.  He vomited a large clot yesterday.  Oral Phenergan  did not help the nausea.  IV Phenergan  continues to help.SABRA  He tolerated the chemotherapy well yesterday.  Objective: Vital signs in last 24 hours: Blood pressure 122/79, pulse 92, temperature 98.7 F (37.1 C), temperature source Oral, resp. rate 20, height 6' (1.829 m), weight 158 lb 15.2 oz (72.1 kg), SpO2 99%.  Intake/Output from previous day: 01/14 0701 - 01/15 0700 In: 2646.6 [P.O.:360; I.V.:1531.6; IV Piggyback:755] Out: 1850 [Urine:1600; Emesis/NG output:250]  Physical Exam:  HEENT: No thrush Cardiac: Regular rate and rhythm Lungs: Decreased breath sounds at the right lower posterior chest, no respiratory distress  Abdomen: Soft, nontender, no hepatomegaly Extremities: No leg edema Skin: Erythema of the face and head  Portacath/PICC-without erythema  Lab Results: Recent Labs    12/29/24 0835 12/30/24 0415  WBC 3.2* 4.3  HGB 9.5* 8.9*  HCT 30.8* 29.2*  PLT 292 296      BMET Recent Labs    12/28/24 0409 12/30/24 0415  NA 143 144  K 4.1 4.2  CL 109 111  CO2 23 25  GLUCOSE 187* 173*  BUN 33* 30*  CREATININE 0.65 0.59*  CALCIUM  8.5* 8.6*    Lab Results  Component Value Date   CEA 3.57 01/15/2024      Medications: I have reviewed the patient's current medications.  Assessment/Plan: Gastric cancer 11/21/2023 EGD-gastric body with infiltrative looking lesion; biopsy shows involvement of a hypercellular lesion that suggests diffuse carcinoma by morphology CTs abdomen/pelvis 11/28/2023-diffuse fatty infiltration of the liver; gastric wall thickening; lymph nodes up to 6 mm in diameter near the stomach wall. 12/31/2023 upper endoscopy-large diffuse friable infiltrative polypoid and ulcerated circumferential mass found in the gastric body.  Scope was passed beyond the mass with normal antrum/pylorus.  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  Negative for HER2 (1+); mismatch repair protein IHC normal; PD-L1 CPS 0%; CLDN18 positive: 90% of tumor cells with 2+/3+ membrane staining PET scan 01/07/2024-circumferential hypermetabolic gastric mucosal thickening.  Ill-defined hypermetabolic lymph nodes in the gastrohepatic ligament.  Intense hypermetabolic activity associated with the right lobe of the thyroid  gland.  Small hypermetabolic right axillary node favored reactive. 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  Gastric wall thickening slightly improved. Cycle 7 oxaliplatin , zolbetuximab 05/05/2024, Fulphila  Cycle 8 zolbetuximab 05/20/2024, oxaliplatin  held due to neuropathy, no Fulphila  Cycle 9 oxaliplatin /zolbetuximab 06/03/2024, Fulphila  Cycle 10 oxaliplatin /zolbetuximab 06/17/2024, Fulphila  Cycle 11 oxaliplatin /zolbetuximab 06/30/2024, Fulphila , oxaliplatin  dose reduced Cycle 12 zolbetuximab 07/15/2024, oxaliplatin  held secondary to neuropathy Cycle 13 zolbetuximab 07/29/2024, oxaliplatin  held secondary to neuropathy Cycle 14 zolbetuximab 08/26/2024, oxaliplatin  held secondary to neuropathy 09/01/2024 CTs-mild distal gastric wall thickening without discrete mass.  No metastatic disease. Cycle 15 zolbetuximab 09/09/2024, oxaliplatin  held due to  neuropathy Cycle 16 zolbetuximab 09/30/2024, oxaliplatin  held due to neuropathy Cycle 17 zolbetuximab 10/21/2024, oxaliplatin  held due to neuropathy 11/03/2024 CT abdomen/pelvis: Diffuse mild wall thickening of the distal gastric body and antrum-stable, mild increase in a  right gastric lymph node small volume ascites without omental nodularity, proximal jejunal mural thickening 11/04/2024 upper GI: Narrowing of the gastric antrum, normal gastric emptying, dilated duodenum and proximal jejunum without evidence of a bowel obstruction 11/18/2024 upper endoscopy: Tumor in the gastric body and antrum with antral narrowing, dilated duodenum without evidence of mass or obstruction, esophagitis: Biopsy of the gastric mass-poorly differentiated adenocarcinoma with focal signet ring morphology, esophagus biopsy: Squamous mucosa with focal ulceration, Foundation 1-BRCA2 subclonal, ATM, HRD signature positive, MSS, tumor mutation burden 6, [facility] 1 Cycle 1 weekly cisplatin  11/26/2024 Cycle 2 weekly cisplatin  12/07/2024 Cycle 3 weekly cisplatin  12/15/2024 Radiation to the gastric mass 12/17/2024 Cycle 4 weekly cisplatin  12/22/2024 12/12/2024 CTs: Pneumomediastinum extending into the neck, lower lobe consolidation, esophagus distention, air in the bladder, new mildly enlarged right hilar node, new small volume ascites Cycle 5 weekly cisplatin  12/29/2024 Early satiety, postprandial abdominal pain, weight loss secondary to #1 History of bilateral lower extremity DVT 2021-anticoagulation discontinued due to massive retroperitoneal bleed, IVC filter placed 08/24/2020 Hospitalized with acute respiratory failure due to COVID-19 08/06/2020 - 09/04/2020 History of massive retroperitoneal bleed secondary to anticoagulation September 2021 Thyroid  ultrasound 01/13/2024-no abnormal nodule identified in the right lobe.  1.1 cm thyroid  isthmus nodule meets criteria for 1 year follow-up ultrasound. Admission 02/01/2024 with increased  abdominal pain/ascites Hospitalization with altered mental status 02/05/2024 through 02/13/2024; question rare case of 5-FU psychosis.  Improved 02/16/2024.  Mental status at baseline 02/23/2024. Neutropenia 02/16/2024 Mucositis 02/16/2024.  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  Persistent cold sensitivity 05/20/2024, oxaliplatin  held.  Cold sensitivity resolved 06/03/2024, oxaliplatin  resumed.  Mild loss of vibratory sense, oxaliplatin  dose reduced 06/29/2024 Admission 11/28/2024 with intractable nausea and vomiting 11/27/2024 CT Abdo/pelvis: Bilateral lower lobe aspiration/pneumonia, thick-walled gastric body/antrum, dilated proximal small bowel, small volume pelvic ascites 11/30/2024 EGD: Malignant appearing severe stenosis at the gastric antrum-stent placed, dilated duodenum with retained fluid 15.  Anemia secondary to GI bleeding 12/12/2024 2 units RBCs, 2 units RBCs 12/16/2024, 2 units RBCs 12/18/2024, 2 units RBCs 12/23/2024 IV iron  12/21/2024   Henry May has metastatic gastric cancer.  There is evidence of disease progression with persistent/progressive tumor in the stomach and intractable nausea/vomiting.  The omental disease on presentation last year remains significantly improved, by CT criteria.  He responded well to initial treatment with oxaliplatin  based chemotherapy.  He had neuropathy symptoms from oxaliplatin .  He had psychosis following 5-fluorouracil .  He began a trial of salvage therapy with cisplatin  on 11/26/2024.  He completed cycle 4 on 12/22/2024.  He began palliative radiation to the gastric mass on 12/17/2024.  He is now admitted with intractable nausea/vomiting, potentially related to a small bowel obstruction, ileus from the carcinomatosis, in addition to persistent tumor in the stomach.   An upper endoscopy   confirmed gastric antral stenosis and dilation of the duodenum.  There was retained fluid in the stomach and duodenum.  A gastric antral stent was placed.  He continues to have nausea and vomiting of liquids despite placement of the gastric antral stent.  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  The nausea partially improved with octreotide .  Octreotide  was resumed on 12/04/2025.  He feels the octreotide  helps the nausea.  IV Phenergan  also helps.  He has intermittent hematemesis, likely secondary to the gastric mass.  GI does not recommend an endoscopy.  The hemoglobin is  stable and the hematemesis has improved.  He received 2 units of packed red blood cells on 12/23/2024.  He began palliative radiation to the gastric mass on 12/17/2024 and has completed 9 treatments to date.  Henry May  developed CT evidence of progressive aspiration pneumonia.  He also had pneumomediastinum with subcutaneous emphysema extending to the neck on a CT 12/12/2024.  The significance of the emphysematous findings is unclear.  I reviewed the CT images.  He may have a tumor related microperforation or perforation related to emesis.  Henry May has been evaluated by the palliative care service.  The family has met with the palliative care team. They understand the poor prognosis.   He confirmed his desire to continue treatment.  We submitted tissue for molecular testing to look for options for targeted treatment and assess the tumor mutation burden.  The results reveal a subclonal BRCA2 alteration, a positive HRD signal, and an ATM operation.  He may be a candidate for a PARP inhibitor if his clinical status improves.   He may be stable for discharge soon.  Oral Phenergan  has not helped.  We will ask pharmacy to consider adding Phenergan  to the TPN.  Recommendations: Continue TPN, asked pharmacy to add Phenergan  to the TPN Continue Phenergan  and Ativan  as needed for nausea,  continue antiacid therapy Continue palliative radiation to the gastric mass-scheduled for last treatment today Packed red blood cells as needed for severe anemia Out of bed, ambulate as tolerated Continue octreotide , convert to Sandostatin  as an outpatient Decrease frequency of CBGs 9.   Incentive spirometer   LOS: 32 days   Arley Hof, MD   12/30/2024, 1:59 PM

## 2024-12-31 ENCOUNTER — Other Ambulatory Visit: Payer: Self-pay

## 2024-12-31 ENCOUNTER — Other Ambulatory Visit: Payer: Self-pay | Admitting: *Deleted

## 2024-12-31 ENCOUNTER — Inpatient Hospital Stay (HOSPITAL_COMMUNITY)

## 2024-12-31 DIAGNOSIS — C169 Malignant neoplasm of stomach, unspecified: Secondary | ICD-10-CM

## 2024-12-31 DIAGNOSIS — K920 Hematemesis: Secondary | ICD-10-CM | POA: Diagnosis not present

## 2024-12-31 LAB — CBC
HCT: 25.4 % — ABNORMAL LOW (ref 39.0–52.0)
Hemoglobin: 7.9 g/dL — ABNORMAL LOW (ref 13.0–17.0)
MCH: 30.4 pg (ref 26.0–34.0)
MCHC: 31.1 g/dL (ref 30.0–36.0)
MCV: 97.7 fL (ref 80.0–100.0)
Platelets: 284 K/uL (ref 150–400)
RBC: 2.6 MIL/uL — ABNORMAL LOW (ref 4.22–5.81)
RDW: 16.5 % — ABNORMAL HIGH (ref 11.5–15.5)
WBC: 3.6 K/uL — ABNORMAL LOW (ref 4.0–10.5)
nRBC: 0.6 % — ABNORMAL HIGH (ref 0.0–0.2)

## 2024-12-31 LAB — GLUCOSE, CAPILLARY
Glucose-Capillary: 162 mg/dL — ABNORMAL HIGH (ref 70–99)
Glucose-Capillary: 151 mg/dL — ABNORMAL HIGH (ref 70–99)
Glucose-Capillary: 132 mg/dL — ABNORMAL HIGH (ref 70–99)
Glucose-Capillary: 135 mg/dL — ABNORMAL HIGH (ref 70–99)

## 2024-12-31 LAB — BASIC METABOLIC PANEL WITH GFR
Anion gap: 7 (ref 5–15)
BUN: 41 mg/dL — ABNORMAL HIGH (ref 6–20)
CO2: 28 mmol/L (ref 22–32)
Calcium: 8.1 mg/dL — ABNORMAL LOW (ref 8.9–10.3)
Chloride: 110 mmol/L (ref 98–111)
Creatinine, Ser: 0.69 mg/dL (ref 0.61–1.24)
GFR, Estimated: >60 mL/min
Glucose, Bld: 156 mg/dL — ABNORMAL HIGH (ref 70–99)
Potassium: 4.2 mmol/L (ref 3.5–5.1)
Sodium: 145 mmol/L (ref 135–145)

## 2024-12-31 MED ORDER — TRAVASOL 10 % IV SOLN
INTRAVENOUS | Status: AC
  Filled 2024-12-31: qty 1188

## 2024-12-31 MED ORDER — OLANZAPINE 5 MG PO TABS
5.0000 mg | ORAL_TABLET | Freq: Every day | ORAL | Status: AC
  Administered 2024-12-31 – 2025-01-04 (×5): 5 mg via ORAL
  Filled 2024-12-31 (×5): qty 1

## 2024-12-31 MED ORDER — SCOPOLAMINE 1 MG/3DAYS TD PT72
1.0000 | MEDICATED_PATCH | TRANSDERMAL | Status: DC
  Administered 2024-12-31 – 2025-01-03 (×2): 1 mg via TRANSDERMAL
  Filled 2024-12-31 (×2): qty 1

## 2024-12-31 MED ORDER — INSULIN ASPART 100 UNIT/ML IJ SOLN
0.0000 [IU] | Freq: Two times a day (BID) | INTRAMUSCULAR | Status: DC
  Administered 2024-12-31 – 2025-01-03 (×4): 2 [IU] via SUBCUTANEOUS
  Filled 2024-12-31 (×4): qty 2

## 2024-12-31 NOTE — Progress Notes (Signed)
 IP PROGRESS NOTE  Subjective:   Mr. Tolen continues to have intermittent vomiting.  He vomited 6 times yesterday.  IV Phenergan  continues to help the nausea.  No further hematemesis.  No bowel movement.  He reports passing flatus.  No pain.  He is wife and daughter were at the bedside when I saw him at approximately 8 AM.  He is tolerating popsicles. Objective: Vital signs in last 24 hours: Blood pressure 109/63, pulse 100, temperature 98.1 F (36.7 C), temperature source Oral, resp. rate 18, height 6' (1.829 m), weight 158 lb 15.2 oz (72.1 kg), SpO2 94%.  Intake/Output from previous day: 01/15 0701 - 01/16 0700 In: 2571.5 [P.O.:375; I.V.:2011.6; IV Piggyback:185] Out: 1150 [Urine:1150]  Physical Exam:  HEENT: No thrush  Abdomen: Soft, nontender, no hepatomegaly Extremities: No leg edema  Portacath/PICC-without erythema  Lab Results: Recent Labs    12/30/24 0415 12/31/24 0256  WBC 4.3 3.6*  HGB 8.9* 7.9*  HCT 29.2* 25.4*  PLT 296 284      BMET Recent Labs    12/30/24 0415 12/31/24 0256  NA 144 145  K 4.2 4.2  CL 111 110  CO2 25 28  GLUCOSE 173* 156*  BUN 30* 41*  CREATININE 0.59* 0.69  CALCIUM  8.6* 8.1*    Lab Results  Component Value Date   CEA 3.57 01/15/2024      Medications: I have reviewed the patient's current medications.  Assessment/Plan: Gastric cancer 11/21/2023 EGD-gastric body with infiltrative looking lesion; biopsy shows involvement of a hypercellular lesion that suggests diffuse carcinoma by morphology CTs abdomen/pelvis 11/28/2023-diffuse fatty infiltration of the liver; gastric wall thickening; lymph nodes up to 6 mm in diameter near the stomach wall. 12/31/2023 upper endoscopy-large diffuse friable infiltrative polypoid and ulcerated circumferential mass found in the gastric body.  Scope was passed beyond the mass with normal antrum/pylorus.  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  Negative for HER2 (1+); mismatch repair protein IHC normal; PD-L1 CPS 0%; CLDN18 positive: 90% of tumor cells with 2+/3+ membrane staining PET scan 01/07/2024-circumferential hypermetabolic gastric mucosal thickening.  Ill-defined hypermetabolic lymph nodes in the gastrohepatic ligament.  Intense hypermetabolic activity associated with the right lobe of the thyroid  gland.  Small hypermetabolic right axillary node favored reactive. 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  Gastric wall thickening slightly improved. Cycle 7 oxaliplatin , zolbetuximab 05/05/2024, Fulphila  Cycle 8 zolbetuximab 05/20/2024, oxaliplatin  held due to neuropathy, no Fulphila  Cycle 9 oxaliplatin /zolbetuximab 06/03/2024, Fulphila  Cycle 10 oxaliplatin /zolbetuximab 06/17/2024, Fulphila  Cycle 11 oxaliplatin /zolbetuximab 06/30/2024, Fulphila , oxaliplatin  dose reduced Cycle 12 zolbetuximab 07/15/2024, oxaliplatin  held secondary to neuropathy Cycle 13 zolbetuximab 07/29/2024, oxaliplatin  held secondary to neuropathy Cycle 14 zolbetuximab 08/26/2024, oxaliplatin  held secondary to neuropathy 09/01/2024 CTs-mild distal gastric wall thickening without discrete mass.  No metastatic disease. Cycle 15 zolbetuximab 09/09/2024, oxaliplatin  held due to neuropathy Cycle 16 zolbetuximab 09/30/2024, oxaliplatin  held due to  neuropathy Cycle 17 zolbetuximab 10/21/2024, oxaliplatin  held due to neuropathy 11/03/2024 CT abdomen/pelvis: Diffuse mild wall thickening of the distal gastric body and antrum-stable, mild increase in a right gastric lymph  node small volume ascites without omental nodularity, proximal jejunal mural thickening 11/04/2024 upper GI: Narrowing of the gastric antrum, normal gastric emptying, dilated duodenum and proximal jejunum without evidence of a bowel obstruction 11/18/2024 upper endoscopy: Tumor in the gastric body and antrum with antral narrowing, dilated duodenum without evidence of mass or obstruction, esophagitis: Biopsy of the gastric mass-poorly differentiated adenocarcinoma with focal signet ring morphology, esophagus biopsy: Squamous mucosa with focal ulceration, Foundation 1-BRCA2 subclonal, ATM, HRD signature positive, MSS, tumor mutation burden 6, [facility] 1 Cycle 1 weekly cisplatin  11/26/2024 Cycle 2 weekly cisplatin  12/07/2024 Cycle 3 weekly cisplatin  12/15/2024 Radiation to the gastric mass 12/17/2024 Cycle 4 weekly cisplatin  12/22/2024 12/12/2024 CTs: Pneumomediastinum extending into the neck, lower lobe consolidation, esophagus distention, air in the bladder, new mildly enlarged right hilar node, new small volume ascites Cycle 5 weekly cisplatin  12/29/2024 Early satiety, postprandial abdominal pain, weight loss secondary to #1 History of bilateral lower extremity DVT 2021-anticoagulation discontinued due to massive retroperitoneal bleed, IVC filter placed 08/24/2020 Hospitalized with acute respiratory failure due to COVID-19 08/06/2020 - 09/04/2020 History of massive retroperitoneal bleed secondary to anticoagulation September 2021 Thyroid  ultrasound 01/13/2024-no abnormal nodule identified in the right lobe.  1.1 cm thyroid  isthmus nodule meets criteria for 1 year follow-up ultrasound. Admission 02/01/2024 with increased abdominal pain/ascites Hospitalization with altered mental status 02/05/2024  through 02/13/2024; question rare case of 5-FU psychosis.  Improved 02/16/2024.  Mental status at baseline 02/23/2024. Neutropenia 02/16/2024 Mucositis 02/16/2024.  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  Persistent cold sensitivity 05/20/2024, oxaliplatin  held.  Cold sensitivity resolved 06/03/2024, oxaliplatin  resumed.  Mild loss of vibratory sense, oxaliplatin  dose reduced 06/29/2024 Admission 11/28/2024 with intractable nausea and vomiting 11/27/2024 CT Abdo/pelvis: Bilateral lower lobe aspiration/pneumonia, thick-walled gastric body/antrum, dilated proximal small bowel, small volume pelvic ascites 11/30/2024 EGD: Malignant appearing severe stenosis at the gastric antrum-stent placed, dilated duodenum with retained fluid 15.  Anemia secondary to GI bleeding 12/12/2024 2 units RBCs, 2 units RBCs 12/16/2024, 2 units RBCs 12/18/2024, 2 units RBCs 12/23/2024 IV iron  12/21/2024   Mr Forget has metastatic gastric cancer.  There is evidence of disease progression with persistent/progressive tumor in the stomach and intractable nausea/vomiting.  The omental disease on presentation last year remains significantly improved, by CT criteria.  He responded well to initial treatment with oxaliplatin  based chemotherapy.  He had neuropathy symptoms from oxaliplatin .  He had psychosis following 5-fluorouracil .  He began a trial of salvage therapy with cisplatin  on 11/26/2024.  He completed cycle 4 on 12/22/2024.  He began palliative radiation to the gastric mass on 12/17/2024.  He is now admitted with intractable nausea/vomiting, potentially related to a small bowel obstruction, ileus from the carcinomatosis, in addition to persistent tumor in the stomach.   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  There was retained  fluid in the stomach and duodenum.  A gastric antral stent was placed.  He continues to have nausea and vomiting of liquids despite placement of the gastric antral stent.  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  The nausea partially improved with octreotide .  He feels octreotide  and IV Phenergan  helped the nausea.  Oral Phenergan , lorazepam , Decadron , metoclopramide , and ondansetron  have not helped.  He has intermittent hematemesis, likely secondary to the gastric mass.  GI does not recommend an endoscopy.  The hemoglobin is stable  and the hematemesis has improved.  He received 2 units of packed red blood cells on 12/23/2024.  He began palliative radiation to the gastric mass on 12/17/2024 and completed the final treatment with radiation 12/30/2024.  Mr Desa  developed CT evidence of progressive aspiration pneumonia.  He also had pneumomediastinum with subcutaneous emphysema extending to the neck on a CT 12/12/2024.  The significance of the emphysematous findings is unclear.  I reviewed the CT images.  He may have a tumor related microperforation or perforation related to emesis.  Mr Jaworski has been evaluated by the palliative care service.  The family has met with the palliative care team. They understand the poor prognosis.   He confirmed his desire to continue treatment.  We submitted tissue for molecular testing to look for options for targeted treatment and assess the tumor mutation burden.  The results reveal a subclonal BRCA2 alteration, a positive HRD signal, and an ATM alteration.  He may be a candidate for a PARP inhibitor if his clinical status improves.  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  We will attempt to arrange genetic testing to rule out a germline BRCA2 or CDH 1 mutation.   He will be stable for discharge if the nausea improves.  A scopolamine  patch was added yesterday.  He will begin a trial of Zyprexa   today..  Recommendations: Continue TPN, asked pharmacy to add  Continue Phenergan , scopolamine  patch, add Zyprexa  Packed red blood cells as needed for severe anemia Out of bed, ambulate as tolerated Continue octreotide , convert to Sandostatin  as an outpatient Decrease frequency of CBGs 9.   Incentive spirometer 10.  Obtain germline genetic testing, I will ask the genetics counselor if this can be performed while in inpatient   LOS: 33 days   Arley Hof, MD   12/31/2024, 8:54 AM

## 2024-12-31 NOTE — Progress Notes (Signed)
 PHARMACY - TOTAL PARENTERAL NUTRITION CONSULT NOTE   Indication: intolerance to enteral feeds  Patient Measurements: Height: 6' (182.9 cm) Weight: 72.1 kg (158 lb 15.2 oz) IBW/kg (Calculated) : 77.6 TPN AdjBW (KG): 70 Body mass index is 21.56 kg/m.  Assessment: 55 yoM admitted on 12/13 with intractable nausea and vomiting, dilated small bowel loops, and coffee-ground emesis. 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 He underwent EGD 12/16 showing esophagitis, gastric stenosis, stent placed, impaired peristalsis. Pharmacy was consulted to dose TPN on 12/20 for intolerance to enteral feeding.    - 1/12 Octreotide  resumed (as 100 mcg BID injections) - 1/14 TPN line broke during transport around 10a; D10 hung until new TPN started ~6p  Glucose / Insulin : No hx DM; A1c 5.7% in Dec - CBGs 118 - 171. Decadron  x 1 with chemo 1/14 - 9 units mSSI given yesterday Electrolytes: WNL & stable Renal: SCr stable at baseline (<1); BUN elevated  - UOP adequate; Hepatic: Alk Phos and ALT slightly elevated and trending up; albumin  remains low but stable (12/30/24); otherwise WNL - TG WNL1/12 Intake / Output: minimal PO intake d/t continued nausea,  continues to have intermittent vomiting; no drains or recent diuretics - FLD 375 mls recorded 1/15 - LBM 1/12 GI Imaging: - 12/18 AXR: No evidence of obstruction; 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; dilated duodenum - 12/25 AXR: nonobstructive bowel gas pattern - 12/27 AXR: unchanged - 12/28 CT: nonspecific esophageal distention, enteritis, new small volume of ascites - 1/8 AXR: nonobstructive bowel gas pattern, otherwise unchanged GI Surgeries / Procedures:  -12/16 EGD: esophagitis  GI meds:   - Protonix  80 mg IV BID (increased 1/8) - Sucralfate  1g PO TID - Octreotide  100 mg SQ BID - PRN phenergan  - scopolamine  patch ( 1/16)   Central access: Implanted Port (01/22/24) TPN start date: 12/20   Nutritional Goals: TPN at 90 ml/hr  provides 119 g of protein and 2298 kcals per day  RD Assessment: Estimated Needs Total Energy Estimated Needs: 2100-2450 kcals Total Protein Estimated Needs: 105-120 grams Total Fluid Estimated Needs: >/= 2.1L  Current Nutrition:  FLD started 1/11 1/15 Ensure changed to [employer] 1.4 po BID TPN  Plan:  Continue TPN at 90 ml/hr For discharge planning, please note that at least 2-3 days AFTER becoming stable on continuous TPN are required to fully transition patients to cyclic TPN. Electrolytes in TPN: no changes Na 75 mEq/L K 40 mEq/L Ca 2 mEq/L Mg 5 mEq/L Phos 20 mmol/L Cl:Ac 1:2 Add standard MVI and trace elements to TPN Chromium remains on hold d/t national shortage Continue mSSI, decrease CBGs to q12 hrs to minimize finger sticks Maintenance IV fluids per MD (none currently) TPN labs on Mon/Thurs   Rosaline IVAR Edison, Pharm.D Use secure chat for questions 12/31/2024 7:51 AM

## 2024-12-31 NOTE — Progress Notes (Signed)
 " PROGRESS NOTE    Henry May  FMW:[id number] DOB: October 19, 1966 DOA: 11/27/2024 PCP: [name], DO    Brief Narrative:   Henry May is a 59 y.o. male with past medical history significant for metastatic gastric cancer currently on chemotherapy/radiation (oncology, Dr. Cloretta), intractable nausea/vomiting due to progressive gastric cancer/omental disease, gastric antral stenosis s/p stent, intermittent hematemesis secondary to gastric mass, DVT s/p IVC filter who presented to [facility] on 12/13 with nausea/vomiting, coffee-ground emesis, weakness.  Patient underwent recent EGD 12/4 with LA grade D esophagitis without bleeding, large infiltrative sessile and ulcerated circumferential mass with contact of bleeding in the gastric body/antrum with antral narrowing and luminal diameter approxione 0.5 cm.  In the ED, temperature 98.5 F, HR 95, RR 23, BP 133/92, SpO2 93% on room air.  WBC 7.1, hemoglobin 14.5, platelet count 274.  Sodium 135, potassium 4.0, chloride 97, CO2 25, glucose 122, BUN 929, creatinine 0.83.  Lipase 171.  AST 25, ALT 62, total bilirubin 0.6.  High sensitive troponin less than 15.  INR 1.1.  CT abdomen/pelvis with contrast with dilated proximal small bowel suggesting small bowel obstruction with a discrete transition point not seen, thick walled gastric body/antrum with history of gastric cancer without definite focal mass, mild nodularity along the pylorus, scarring with superimposed patchy peribronchial vascular nodularity bilateral lower lobes right greater than left suggestive of mild aspiration/pneumonia.  Gastroenterology, general surgery, oncology consulted.  TRH consulted for admission and patient was transferred to [facility] for further evaluation management.  Patient underwent EGD with gastric stent placement on 12/16, unable to tolerate diet and started on TPN.  Plan to discharge on TPN but nausea and vomiting progress following stopping  of octreotide  and Decadron  with hemoptysis, restarted on octreotide  infusion.  CT chest/abdomen/pelvis with new patchy substernal pneumomediastinum of indeterminate source, patchy consolidation in lower lobes, posterior LUL, esophageal distention with retained/reflux fluid in thoracic inlet, emphysematous cystitis and circumferential gastric thickening with adjacent stranding change, antropyloric stenting and continued duodenal dilation, thickening of jejunal folds.  Ongoing bleeding felt secondary to metastatic gastric carcinoma.  Patient was continued on chemotherapy and started on XRT to decrease tumor burden and provide symptomatic relief.  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  CT abdomen/pelvis 12/13 with thick walled gastric body and antrum without a definitive mass with nodularity along the pylorus and proximal dilated small bowel loops, questionable mild obstruction without discrete transition point.  Underwent EGD with gastric stent placement on 12/16.  Seen by general surgery, no indication for surgical intervention at this time and signed off 12/19.  Developed hematemesis 12/25 with or without hemoptysis and octreotide  was restarted.  Repeat CT abdomen/pelvis 12/28 with circumferential gastric thickening and antropyloric stent with continued dilation of the duodenum 6 mL centimeters caliber without any appreciable transition, thickened jejunal folds consistent with nonspecific enteritis and small-volume ascites.  Seen by GI, limited role of endoscopy with possible malignant bleeding and potential retroperitoneal air tracking, signed off 1/2.  Oral/rectal Phenergan , lorazepam , Decadron , Compazine , Zofran   have not helped his nausea and vomiting. -- Medical oncology, radiation oncology following, appreciate assistance -- Completed 10 fractions of radiation 12/30/2024. -- Received cycle 5 of weekly cisplatin  12/29/2024 -- Dilaudid  0.5 mg IV every 4 hours as needed severe pain -- Octreotide   100 mcg [place] BID > plan to change to Sandostatin  with TPN on discharge per oncology -- Protonix  80 mg IV q12h -- Carafate  1 g p.o. 3 times daily -- Phenergan  12.5 mg IV PRN nausea/vomiting -- Scopolamine  patch -- Oncology starting Zyprexa  today -- Also consideration for Haldol ; as well as droperidol (although has QTc prolongation most recent EKG 510 msec) -- Continue TPN, pharmacy consult for dosing/monitoring -- TOC following for discharge planning -- Palliative care continues to follow intermittently, overall prognosis remains poor/grim; would recommend transitioning to hospice care  Aspiration pneumonitis/pneumonia Pneumomediastinum noted on CT chest.  Completed 7-day course of Unasyn , completed on 12/17/2024.  Leukopenia Stable, continue to monitor closely on chemotherapy  Anemia Iron  deficiency -- Hgb 14.5>>6.7>8.8>>6.3>>9.7>>7.3>>9.1>>8.8>9.5>8.9 -- Transfused 8 unit PRBCs during his hospitalization, last 12/23/2024 -- Continue monitor hemoglobin intermittently, transfuse for hemoglobin less than 7.0  Type 2 diabetes mellitus Diet controlled, hemoglobin A1c 5.7%.  History of DVT S/p IVC filter  Elevated lipase -- Lipase 171>53  Severe malnutrition Body mass index is 21.56 kg/m. Nutrition Status: Nutrition Problem: Severe Malnutrition Etiology: chronic illness (gastric cancer) Signs/Symptoms: severe fat depletion, severe muscle depletion, percent weight loss (24% weight loss in less than 3 months) Percent weight loss: 24 % (in < 3 months) Interventions: Refer to RD note for recommendations -- Continue TPN, full liquid diet as tolerates   DVT prophylaxis: Place and maintain sequential  compression device Start: 12/15/24 1907 SCDs Start: 11/28/24 1042    Code Status: Limited: Do not attempt resuscitation (DNR) -DNR-LIMITED -Do Not Intubate/DNI  Family Communication:   Disposition Plan:  Level of care: Progressive Status is: Inpatient Remains inpatient appropriate because: TPN, needs home TPN set up; overall poor/grim prognosis with recommendation of transitioning to hospice care as he continues with significant nausea and vomiting despite aggressive treatment with antiemetics    Consultants:  Medical oncology, Dr. Cloretta Radiation oncology General Surgery - signed off 12/19 [place] [facility] -signed off 1/2 Palliative care  Procedures:  EGD  Antimicrobials:  Unasyn  12/27 - 1/2   Subjective: Patient seen examined bedside, lying in bed.  Spouse and daughter present at bedside.  Discussed with oncology, Dr. Cloretta this morning.  Added scopolamine  patch, and adding Zyprexa  to see if this assists with his nausea and vomiting.  Discussed with pharmacist, unable to put Phenergan  in TPN as this is not compatible.  No other specific questions or concerns at this time.  Denies headache, no visual changes, no chest pain, no palpitations, no shortness of breath, no abdominal pain, no fever/chills/night sweats, no diarrhea.   Overall prognosis poor given advanced malignancy with poor response to treatment and with persistent nausea and vomiting despite aggressive measures/treatment modalities.   Objective: Vitals:   12/30/24 0157 12/30/24 1319 12/30/24 2152 12/31/24 0628  BP: 113/67 122/79 111/75 109/63  Pulse: 87 92 89 100  Resp: (!) 22 20 17 18   Temp: 97.7 F (36.5 C) 98.7 F (37.1 C) 98.1 F (36.7 C) 98.1 F (36.7 C)  TempSrc: Oral Oral Oral Oral  SpO2: 97% 99% 100% 94%  Weight:      Height:        Intake/Output Summary (Last 24 hours) at 12/31/2024 1216 Last data filed at 12/31/2024 0400 Gross per 24 hour  Intake 2451.52 ml  Output 1150 ml  Net  1301.52 ml   Filed Weights   12/28/24 0500  Weight: 72.1 kg    Examination:  Physical Exam: GEN: NAD, alert and oriented x 3, chronically ill in appearance,  appears older than stated age HEENT: NCAT, PERRL, EOMI, sclera clear PULM: CTAB w/o wheezes/crackles, normal respiratory effort, SpO2 94% on 4 L nasal cannula CV: RRR w/o M/G/R GI: abd soft, NTND, + BS MSK: no peripheral edema NEURO: No focal neurological deficit PSYCH: normal mood/affect Integumentary: No concerning rashes/lesions/wounds noted on exposed skin surfaces    Data Reviewed: I have personally reviewed following labs and imaging studies  CBC: Recent Labs  Lab 12/25/24 0419 12/27/24 0341 12/28/24 0409 12/29/24 0835 12/30/24 0415 12/31/24 0256  WBC 3.3* 2.6* 2.8* 3.2* 4.3 3.6*  NEUTROABS 2.8  --   --  2.1  --   --   HGB 9.1* 9.0* 8.8* 9.5* 8.9* 7.9*  HCT 28.2* 28.4* 27.9* 30.8* 29.2* 25.4*  MCV 91.6 95.6 96.2 97.8 97.7 97.7  PLT 249 264 275 292 296 284   Basic Metabolic Panel: Recent Labs  Lab 12/25/24 0419 12/26/24 0352 12/27/24 0341 12/28/24 0409 12/30/24 0415 12/31/24 0256  NA 143 143 143 143 144 145  K 3.8 3.8 3.9 4.1 4.2 4.2  CL 110 110 110 109 111 110  CO2 22 23 23 23 25 28   GLUCOSE 140* 125* 144* 187* 173* 156*  BUN 41* 41* 37* 33* 30* 41*  CREATININE 0.69 0.69 0.68 0.65 0.59* 0.69  CALCIUM  8.0* 8.3* 8.6* 8.5* 8.6* 8.1*  MG 2.4  --  2.4 2.3 2.4  --   PHOS 2.8  --  3.1  --  3.1  --    GFR: Estimated Creatinine Clearance: 100.1 mL/min (by C-G formula based on SCr of 0.69 mg/dL). Liver Function Tests: Recent Labs  Lab 12/27/24 0341 12/30/24 0415  AST 21 25  ALT 25 51*  ALKPHOS 141* 192*  BILITOT 0.4 0.3  PROT 6.0* 6.5  ALBUMIN  2.6* 2.8*   No results for input(s): LIPASE, AMYLASE in the last 168 hours. No results for input(s): AMMONIA in the last 168 hours. Coagulation Profile: No results for input(s): INR, PROTIME in the last 168 hours. Cardiac Enzymes: No  results for input(s): CKTOTAL, CKMB, CKMBINDEX, TROPONINI in the last 168 hours. BNP (last 3 results) Recent Labs    12/11/24 1208  PROBNP 244.0   HbA1C: No results for input(s): HGBA1C in the last 72 hours. CBG: Recent Labs  Lab 12/30/24 0621 12/30/24 1203 12/30/24 1816 12/30/24 2314 12/31/24 0625  GLUCAP 127* 160* 118* 171* 162*   Lipid Profile: No results for input(s): CHOL, HDL, LDLCALC, TRIG, CHOLHDL, LDLDIRECT in the last 72 hours.  Thyroid  Function Tests: No results for input(s): TSH, T4TOTAL, FREET4, T3FREE, THYROIDAB in the last 72 hours. Anemia Panel: No results for input(s): VITAMINB12, FOLATE, FERRITIN, TIBC, IRON , RETICCTPCT in the last 72 hours. Sepsis Labs: No results for input(s): PROCALCITON, LATICACIDVEN in the last 168 hours.  No results found for this or any previous visit (from the past 240 hours).       Radiology Studies: DG CHEST PORT 1 VIEW Result Date: 12/31/2024 EXAM: 1 VIEW(S) XRAY OF THE CHEST 12/31/2024 05:29:00 AM COMPARISON: 12/11/2024 CLINICAL HISTORY: SOB (shortness of breath) FINDINGS: LINES, TUBES AND DEVICES: Right chest port with tip at superior cavoatrial junction. IVC filter noted. LUNGS AND PLEURA: Persistent reticulonodular opacities at right lung base and right upper lobe. Improved aeration of left lung base. No pleural effusion. No pneumothorax. HEART AND MEDIASTINUM: No acute abnormality of the cardiac and mediastinal silhouettes. BONES AND SOFT TISSUES: No acute osseous abnormality. IMPRESSION: 1. Persistent reticulonodular opacities at the right lung base and right upper lobe.  2. Improved aeration of the left lung base. Electronically signed by: Taylor Stroud MD 12/31/2024 06:15 AM EST RP Workstation: [id number]        Scheduled Meds:  Chlorhexidine  Gluconate Cloth  6 each Topical Daily   feeding supplement (KATE FARMS STANDARD 1.4) Liquid  325 mL Oral BID BM   insulin  aspart   0-15 Units Subcutaneous Q12H   octreotide   100 mcg Subcutaneous BID   OLANZapine   5 mg Oral Daily   mouth rinse  15 mL Mouth Rinse 4 times per day   pantoprazole  (PROTONIX ) IV  80 mg Intravenous Q12H   scopolamine   1 patch Transdermal Q72H   sucralfate   1 g Oral TID   Continuous Infusions:  promethazine  (PHENERGAN ) injection (IM or IVPB) 12.5 mg (12/31/24 0122)   TPN ADULT (ION) 90 mL/hr at 12/30/24 1827   TPN ADULT (ION)       LOS: 33 days    Time spent: 49 minutes spent on 12/31/2024 caring for this patient face-to-face including chart review, ordering labs/tests, documenting, discussion with nursing staff, consultants, updating family and interview/physical exam    Camellia PARAS Shekelia Boutin, DO [facility] Available via Epic secure chat 7am-7pm After these hours, please refer to coverage provider listed on [email or web address] 12/31/2024, 12:16 PM   "

## 2025-01-01 DIAGNOSIS — K920 Hematemesis: Secondary | ICD-10-CM | POA: Diagnosis not present

## 2025-01-01 LAB — GLUCOSE, CAPILLARY: Glucose-Capillary: 136 mg/dL — ABNORMAL HIGH (ref 70–99)

## 2025-01-01 MED ORDER — PANTOPRAZOLE SODIUM 40 MG PO TBEC
80.0000 mg | DELAYED_RELEASE_TABLET | Freq: Two times a day (BID) | ORAL | Status: DC
  Administered 2025-01-01 – 2025-01-05 (×9): 80 mg via ORAL
  Filled 2025-01-01 (×9): qty 2

## 2025-01-01 MED ORDER — FLUCONAZOLE 100 MG PO TABS
100.0000 mg | ORAL_TABLET | Freq: Every day | ORAL | Status: DC
  Administered 2025-01-01 – 2025-01-04 (×4): 100 mg via ORAL
  Filled 2025-01-01 (×4): qty 1

## 2025-01-01 MED ORDER — TRAVASOL 10 % IV SOLN
INTRAVENOUS | Status: AC
  Filled 2025-01-01: qty 1188

## 2025-01-01 NOTE — Plan of Care (Signed)
   Problem: Education: Goal: Knowledge of General Education information will improve Description: Including pain rating scale, medication(s)/side effects and non-pharmacologic comfort measures Outcome: Progressing   Problem: Coping: Goal: Level of anxiety will decrease Outcome: Progressing   Problem: Safety: Goal: Ability to remain free from injury will improve Outcome: Progressing

## 2025-01-01 NOTE — Progress Notes (Addendum)
 IP PROGRESS NOTE  Subjective:   Henry. Henry May reports improvement in nausea.  He vomited twice yesterday.  His wife and daughter were at the bedside when I saw him at approximately 7:30 AM.  They feel the nausea has improved with the addition of a scopolamine  patch and Zyprexa .  No further hematemesis.  No bowel movement. Objective: Vital signs in last 24 hours: Blood pressure 127/68, pulse 84, temperature 97.7 F (36.5 C), temperature source Oral, resp. rate 18, height 6' (1.829 m), weight 162 lb 0.6 oz (73.5 kg), SpO2 100%.  Intake/Output from previous day: 01/16 0701 - 01/17 0700 In: 1165.8 [I.V.:1031.8; IV Piggyback:134] Out: -   Physical Exam:  HEENT: Thrush at the bilateral buccal mucosa Lungs: Inspiratory rhonchi/rales at the lower posterior chest bilaterally, no respiratory distress Cardiac: Regular rate and rhythm  Abdomen: Soft, nontender, no hepatomegaly Extremities: No leg edema  Portacath/PICC-without erythema  Lab Results: Recent Labs    12/30/24 0415 12/31/24 0256  WBC 4.3 3.6*  HGB 8.9* 7.9*  HCT 29.2* 25.4*  PLT 296 284      BMET Recent Labs    12/30/24 0415 12/31/24 0256  NA 144 145  K 4.2 4.2  CL 111 110  CO2 25 28  GLUCOSE 173* 156*  BUN 30* 41*  CREATININE 0.59* 0.69  CALCIUM  8.6* 8.1*    Lab Results  Component Value Date   CEA 3.57 01/15/2024      Medications: I have reviewed the patient's current medications.  Assessment/Plan: Gastric cancer 11/21/2023 EGD-gastric body with infiltrative looking lesion; biopsy shows involvement of a hypercellular lesion that suggests diffuse carcinoma by morphology CTs abdomen/pelvis 11/28/2023-diffuse fatty infiltration of the liver; gastric wall thickening; lymph nodes up to 6 mm in diameter near the stomach wall. 12/31/2023 upper endoscopy-large diffuse friable infiltrative polypoid and ulcerated circumferential mass found in the gastric body.  Scope was passed beyond the mass with normal  antrum/pylorus.  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  Negative for HER2 (1+); mismatch repair protein IHC normal; PD-L1 CPS 0%; CLDN18 positive: 90% of tumor cells with 2+/3+ membrane staining PET scan 01/07/2024-circumferential hypermetabolic gastric mucosal thickening.  Ill-defined hypermetabolic lymph nodes in the gastrohepatic ligament.  Intense hypermetabolic activity associated with the right lobe of the thyroid  gland.  Small hypermetabolic right axillary node favored reactive. 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  Gastric wall thickening slightly improved. Cycle 7 oxaliplatin , zolbetuximab 05/05/2024, Fulphila  Cycle 8 zolbetuximab 05/20/2024, oxaliplatin  held due to neuropathy, no Fulphila  Cycle 9 oxaliplatin /zolbetuximab 06/03/2024, Fulphila  Cycle 10 oxaliplatin /zolbetuximab 06/17/2024, Fulphila  Cycle 11 oxaliplatin /zolbetuximab 06/30/2024, Fulphila , oxaliplatin  dose reduced Cycle 12 zolbetuximab 07/15/2024, oxaliplatin  held secondary to neuropathy Cycle 13 zolbetuximab 07/29/2024, oxaliplatin  held secondary to neuropathy Cycle 14 zolbetuximab 08/26/2024, oxaliplatin  held secondary to neuropathy 09/01/2024 CTs-mild distal gastric wall thickening without discrete mass.  No metastatic disease. Cycle 15 zolbetuximab 09/09/2024,  oxaliplatin  held due to neuropathy Cycle 16 zolbetuximab 09/30/2024, oxaliplatin  held due to neuropathy Cycle 17 zolbetuximab 10/21/2024, oxaliplatin  held due to neuropathy 11/03/2024 CT abdomen/pelvis: Diffuse mild wall thickening of the  distal gastric body and antrum-stable, mild increase in a right gastric lymph node small volume ascites without omental nodularity, proximal jejunal mural thickening 11/04/2024 upper GI: Narrowing of the gastric antrum, normal gastric emptying, dilated duodenum and proximal jejunum without evidence of a bowel obstruction 11/18/2024 upper endoscopy: Tumor in the gastric body and antrum with antral narrowing, dilated duodenum without evidence of mass or obstruction, esophagitis: Biopsy of the gastric mass-poorly differentiated adenocarcinoma with focal signet ring morphology, esophagus biopsy: Squamous mucosa with focal ulceration, Foundation 1-BRCA2 subclonal, ATM, HRD signature positive, MSS, tumor mutation burden 6, [facility] 1 Cycle 1 weekly cisplatin  11/26/2024 Cycle 2 weekly cisplatin  12/07/2024 Cycle 3 weekly cisplatin  12/15/2024 Radiation to the gastric mass 12/17/2024 Cycle 4 weekly cisplatin  12/22/2024 12/12/2024 CTs: Pneumomediastinum extending into the neck, lower lobe consolidation, esophagus distention, air in the bladder, new mildly enlarged right hilar node, new small volume ascites Cycle 5 weekly cisplatin  12/29/2024 Early satiety, postprandial abdominal pain, weight loss secondary to #1 History of bilateral lower extremity DVT 2021-anticoagulation discontinued due to massive retroperitoneal bleed, IVC filter placed 08/24/2020 Hospitalized with acute respiratory failure due to COVID-19 08/06/2020 - 09/04/2020 History of massive retroperitoneal bleed secondary to anticoagulation September 2021 Thyroid  ultrasound 01/13/2024-no abnormal nodule identified in the right lobe.  1.1 cm thyroid  isthmus nodule meets criteria for 1 year follow-up ultrasound. Admission 02/01/2024  with increased abdominal pain/ascites Hospitalization with altered mental status 02/05/2024 through 02/13/2024; question rare case of 5-FU psychosis.  Improved 02/16/2024.  Mental status at baseline 02/23/2024. Neutropenia 02/16/2024 Mucositis 02/16/2024.  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  Persistent cold sensitivity 05/20/2024, oxaliplatin  held.  Cold sensitivity resolved 06/03/2024, oxaliplatin  resumed.  Mild loss of vibratory sense, oxaliplatin  dose reduced 06/29/2024 Admission 11/28/2024 with intractable nausea and vomiting 11/27/2024 CT Abdo/pelvis: Bilateral lower lobe aspiration/pneumonia, thick-walled gastric body/antrum, dilated proximal small bowel, small volume pelvic ascites 11/30/2024 EGD: Malignant appearing severe stenosis at the gastric antrum-stent placed, dilated duodenum with retained fluid 15.  Anemia secondary to GI bleeding 12/12/2024 2 units RBCs, 2 units RBCs 12/16/2024, 2 units RBCs 12/18/2024, 2 units RBCs 12/23/2024 IV iron  12/21/2024   Henry May has metastatic gastric cancer.  There is evidence of disease progression with persistent/progressive tumor in the stomach and intractable nausea/vomiting.  The omental disease on presentation last year remains significantly improved, by CT criteria.  He responded well to initial treatment with oxaliplatin  based chemotherapy.  He had neuropathy symptoms from oxaliplatin .  He had psychosis following 5-fluorouracil .  He began a trial of salvage therapy with cisplatin  on 11/26/2024.  He completed cycle 5 on 12/29/2024.  He completed palliative radiation to the gastric mass 12/30/2024.   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  There was retained fluid in the stomach and duodenum.  A gastric antral stent was placed.  He continues to  have nausea and vomiting of liquids despite placement of the gastric antral stent.  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  The nausea partially improved with octreotide .  He feels octreotide  and IV Phenergan  helped the nausea.  Oral Phenergan , lorazepam , Decadron , metoclopramide , and ondansetron  have not helped.  A scopolamine  patch and Zyprexa  were added yesterday.  Nausea has improved.  He has intermittent hematemesis, likely secondary to the gastric mass.  GI does not recommend an endoscopy.  The hemoglobin is stable and the hematemesis  has improved.  He received 2 units of packed red blood cells on 12/23/2024.  He began palliative radiation to the gastric mass on 12/17/2024 and completed the final treatment with radiation 12/30/2024.  Henry May  developed CT evidence of progressive aspiration pneumonia.  He also had pneumomediastinum with subcutaneous emphysema extending to the neck on a CT 12/12/2024.  The significance of the emphysematous findings is unclear.  I reviewed the CT images.  He may have a tumor related microperforation or perforation related to emesis.  Henry May has been evaluated by the palliative care service.  The family has met with the palliative care team. They understand the poor prognosis.   He confirmed his desire to continue treatment.  We submitted tissue for molecular testing to look for options for targeted treatment and assess the tumor mutation burden.  The results reveal a subclonal BRCA2 alteration, a positive HRD signal, and an ATM alteration.  He may be a candidate for a PARP inhibitor if his clinical status improves.  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  We will attempt to arrange genetic testing to rule out a germline BRCA2 or CDH 1 mutation.  Henry May has changed insurance plans.  [facility] is no longer in network.  He and his family are very concerned about the lack of insurance  coverage.   Recommendations: Continue TPN Continue current antiemetic regimen Packed red blood cells as needed for severe anemia Out of bed, ambulate as tolerated Continue octreotide , convert to Sandostatin  as an outpatient Wean oxygen  as tolerated Discharge planning to include home TPN 8.  Obtain germline genetic testing, we are arranging for testing at the main cancer center on the day of discharge from the hospital 9.  Diflucan  for oral candidiasis   LOS: 34 days   Henry Hof, MD   01/01/2025, 7:57 AM

## 2025-01-01 NOTE — Progress Notes (Addendum)
 " PROGRESS NOTE    Henry May  FMW:[id number] DOB: May 28, 1966 DOA: 11/27/2024 PCP: [name], DO    Brief Narrative:   Henry May is a 59 y.o. male with past medical history significant for metastatic gastric cancer currently on chemotherapy/radiation (oncology, Dr. Cloretta), intractable nausea/vomiting due to progressive gastric cancer/omental disease, gastric antral stenosis s/p stent, intermittent hematemesis secondary to gastric mass, DVT s/p IVC filter who presented to [facility] on 12/13 with nausea/vomiting, coffee-ground emesis, weakness.  Patient underwent recent EGD 12/4 with LA grade D esophagitis without bleeding, large infiltrative sessile and ulcerated circumferential mass with contact of bleeding in the gastric body/antrum with antral narrowing and luminal diameter approxione 0.5 cm.  In the ED, temperature 98.5 F, HR 95, RR 23, BP 133/92, SpO2 93% on room air.  WBC 7.1, hemoglobin 14.5, platelet count 274.  Sodium 135, potassium 4.0, chloride 97, CO2 25, glucose 122, BUN 929, creatinine 0.83.  Lipase 171.  AST 25, ALT 62, total bilirubin 0.6.  High sensitive troponin less than 15.  INR 1.1.  CT abdomen/pelvis with contrast with dilated proximal small bowel suggesting small bowel obstruction with a discrete transition point not seen, thick walled gastric body/antrum with history of gastric cancer without definite focal mass, mild nodularity along the pylorus, scarring with superimposed patchy peribronchial vascular nodularity bilateral lower lobes right greater than left suggestive of mild aspiration/pneumonia.  Gastroenterology, general surgery, oncology consulted.  TRH consulted for admission and patient was transferred to [facility] for further evaluation management.  Patient underwent EGD with gastric stent placement on 12/16, unable to tolerate diet and started on TPN.  Plan to discharge on TPN but nausea and vomiting progress following stopping  of octreotide  and Decadron  with hemoptysis, restarted on octreotide  infusion.  CT chest/abdomen/pelvis with new patchy substernal pneumomediastinum of indeterminate source, patchy consolidation in lower lobes, posterior LUL, esophageal distention with retained/reflux fluid in thoracic inlet, emphysematous cystitis and circumferential gastric thickening with adjacent stranding change, antropyloric stenting and continued duodenal dilation, thickening of jejunal folds.  Ongoing bleeding felt secondary to metastatic gastric carcinoma.  Patient was continued on chemotherapy and started on XRT to decrease tumor burden and provide symptomatic relief.  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  CT abdomen/pelvis 12/13 with thick walled gastric body and antrum without a definitive mass with nodularity along the pylorus and proximal dilated small bowel loops, questionable mild obstruction without discrete transition point.  Underwent EGD with gastric stent placement on 12/16.  Seen by general surgery, no indication for surgical intervention at this time and signed off 12/19.  Developed hematemesis 12/25 with or without hemoptysis and octreotide  was restarted.  Repeat CT abdomen/pelvis 12/28 with circumferential gastric thickening and antropyloric stent with continued dilation of the duodenum 6 mL centimeters Henry without any appreciable transition, thickened jejunal folds consistent with nonspecific enteritis and small-volume ascites.  Seen by GI, limited role of endoscopy with possible malignant bleeding and potential retroperitoneal air tracking, signed off 1/2.  Oral/rectal Phenergan , lorazepam , Decadron , Compazine , Zofran   have not helped his nausea and vomiting. -- Medical oncology, radiation oncology following, appreciate assistance -- Completed 10 fractions of radiation 12/30/2024. -- Received cycle 5 of weekly cisplatin  12/29/2024 -- Dilaudid  0.5 mg IV every 4 hours as needed severe pain -- Octreotide   100 mcg [place] BID > plan to change to Sandostatin  with TPN on discharge per oncology -- Protonix  80 mg IV q12h -- Carafate  1 g p.o. 3 times daily -- Phenergan  12.5 mg IV PRN nausea/vomiting -- Scopolamine  patch -- Zyprexa  5 mg p.o. daily -- Continue TPN, pharmacy consult for dosing/monitoring -- TOC following for discharge planning -- Palliative care continues to follow intermittently, overall prognosis remains poor/grim; would recommend transitioning to hospice care  Oral buccal candidiasis Started on Diflucan  by oncology 1/17  Aspiration pneumonitis/pneumonia Pneumomediastinum noted on CT chest.  Completed 7-day course of Unasyn , completed on 12/17/2024.  Leukopenia Stable, continue to monitor closely on chemotherapy  Anemia Iron  deficiency -- Hgb 14.5>>6.7>8.8>>6.3>>9.7>>7.3>>9.1>>8.8>9.5>8.9 -- Transfused 8 unit PRBCs during his hospitalization, last 12/23/2024 -- Continue monitor hemoglobin intermittently, transfuse for hemoglobin less than 7.0  Type 2 diabetes mellitus Diet controlled, hemoglobin A1c 5.7%.  History of DVT S/p IVC filter  Elevated lipase -- Lipase 171>53  Severe malnutrition Body mass index is 21.98 kg/m. Nutrition Status: Nutrition Problem: Severe Malnutrition Etiology: chronic illness (gastric cancer) Signs/Symptoms: severe fat depletion, severe muscle depletion, percent weight loss (24% weight loss in less than 3 months) Percent weight loss: 24 % (in < 3 months) Interventions: Refer to RD note for recommendations -- Continue TPN, full liquid diet as tolerates   DVT prophylaxis: Place and maintain sequential compression device Start: 12/15/24 1907 SCDs Start: 11/28/24  1042    Code Status: Limited: Do not attempt resuscitation (DNR) -DNR-LIMITED -Do Not Intubate/DNI  Family Communication:   Disposition Plan:  Level of care: Progressive Status is: Inpatient Remains inpatient appropriate because: TPN, needs home TPN set up; overall poor/grim prognosis with recommendation of transitioning to hospice care as he continues with significant nausea and vomiting despite aggressive treatment with antiemetics    Consultants:  Medical oncology, Dr. Cloretta Radiation oncology General Surgery - signed off 12/19 B and E gastroenterology -signed off 1/2 Palliative care  Procedures:  EGD  Antimicrobials:  Unasyn  12/27 - 1/2   Subjective: Patient seen examined bedside, lying in bed.  Spouse and daughter present at bedside.  Reported only 2 episodes of nausea and vomiting that were small yesterday after initiation of scopolamine  patch and Zyprexa .  Discussed need to continue to ambulate to avoid deconditioning.  No other specific questions or concerns at this time.  Denies headache, no visual changes, no chest pain, no palpitations, no shortness of breath, no abdominal pain, no fever/chills/night sweats, no diarrhea.   Overall prognosis poor given advanced malignancy with poor response to treatment and with persistent nausea and vomiting despite aggressive measures/treatment modalities.   Objective: Vitals:   12/31/24 1448 12/31/24 2020 01/01/25 0500 01/01/25 0519  BP: 108/74 101/65  127/68  Pulse: (!) 102 97  84  Resp: 18 16  18   Temp: 98.3 F (36.8 C) 99.5 F (37.5 C)  97.7 F (36.5 C)  TempSrc: Oral Oral  Oral  SpO2: 98% 100%  100%  Weight:   73.5 kg   Height:        Intake/Output Summary (Last 24 hours) at 01/01/2025 1242 Last data filed at 01/01/2025 0600 Gross per 24 hour  Intake 1165.78 ml  Output --  Net 1165.78 ml   Filed Weights   12/28/24 0500 01/01/25 0500  Weight: 72.1 kg 73.5 kg    Examination:  Physical Exam: GEN: NAD, alert  and oriented x 3, chronically ill in appearance, appears older than stated age HEENT: NCAT, PERRL, EOMI, sclera clear PULM: CTAB w/o wheezes/crackles, normal  respiratory effort, SpO2 94% on 4 L nasal cannula CV: RRR w/o M/G/R GI: abd soft, NTND, + BS MSK: no peripheral edema NEURO: No focal neurological deficit PSYCH: normal mood/affect Integumentary: No concerning rashes/lesions/wounds noted on exposed skin surfaces    Data Reviewed: I have personally reviewed following labs and imaging studies  CBC: Recent Labs  Lab 12/27/24 0341 12/28/24 0409 12/29/24 0835 12/30/24 0415 12/31/24 0256  WBC 2.6* 2.8* 3.2* 4.3 3.6*  NEUTROABS  --   --  2.1  --   --   HGB 9.0* 8.8* 9.5* 8.9* 7.9*  HCT 28.4* 27.9* 30.8* 29.2* 25.4*  MCV 95.6 96.2 97.8 97.7 97.7  PLT 264 275 292 296 284   Basic Metabolic Panel: Recent Labs  Lab 12/26/24 0352 12/27/24 0341 12/28/24 0409 12/30/24 0415 12/31/24 0256  NA 143 143 143 144 145  K 3.8 3.9 4.1 4.2 4.2  CL 110 110 109 111 110  CO2 23 23 23 25 28   GLUCOSE 125* 144* 187* 173* 156*  BUN 41* 37* 33* 30* 41*  CREATININE 0.69 0.68 0.65 0.59* 0.69  CALCIUM  8.3* 8.6* 8.5* 8.6* 8.1*  MG  --  2.4 2.3 2.4  --   PHOS  --  3.1  --  3.1  --    GFR: Estimated Creatinine Clearance: 102.1 mL/min (by C-G formula based on SCr of 0.69 mg/dL). Liver Function Tests: Recent Labs  Lab 12/27/24 0341 12/30/24 0415  AST 21 25  ALT 25 51*  ALKPHOS 141* 192*  BILITOT 0.4 0.3  PROT 6.0* 6.5  ALBUMIN  2.6* 2.8*   No results for input(s): LIPASE, AMYLASE in the last 168 hours. No results for input(s): AMMONIA in the last 168 hours. Coagulation Profile: No results for input(s): INR, PROTIME in the last 168 hours. Cardiac Enzymes: No results for input(s): CKTOTAL, CKMB, CKMBINDEX, TROPONINI in the last 168 hours. BNP (last 3 results) Recent Labs    12/11/24 1208  PROBNP 244.0   HbA1C: No results for input(s): HGBA1C in the last 72  hours. CBG: Recent Labs  Lab 12/30/24 2314 12/31/24 0625 12/31/24 1216 12/31/24 1818 12/31/24 2145  GLUCAP 171* 162* 151* 132* 135*   Lipid Profile: No results for input(s): CHOL, HDL, LDLCALC, TRIG, CHOLHDL, LDLDIRECT in the last 72 hours.  Thyroid  Function Tests: No results for input(s): TSH, T4TOTAL, FREET4, T3FREE, THYROIDAB in the last 72 hours. Anemia Panel: No results for input(s): VITAMINB12, FOLATE, FERRITIN, TIBC, IRON , RETICCTPCT in the last 72 hours. Sepsis Labs: No results for input(s): PROCALCITON, LATICACIDVEN in the last 168 hours.  No results found for this or any previous visit (from the past 240 hours).       Radiology Studies: DG CHEST PORT 1 VIEW Result Date: 12/31/2024 EXAM: 1 VIEW(S) XRAY OF THE CHEST 12/31/2024 05:29:00 AM COMPARISON: 12/11/2024 CLINICAL HISTORY: SOB (shortness of breath) FINDINGS: LINES, TUBES AND DEVICES: Right chest port with tip at superior cavoatrial junction. IVC filter noted. LUNGS AND PLEURA: Persistent reticulonodular opacities at right lung base and right upper lobe. Improved aeration of left lung base. No pleural effusion. No pneumothorax. HEART AND MEDIASTINUM: No acute abnormality of the cardiac and mediastinal silhouettes. BONES AND SOFT TISSUES: No acute osseous abnormality. IMPRESSION: 1. Persistent reticulonodular opacities at the right lung base and right upper lobe. 2. Improved aeration of the left lung base. Electronically signed by: Waddell Calk MD 12/31/2024 06:15 AM EST RP Workstation: [id number]        Scheduled Meds:  Chlorhexidine  Gluconate Cloth  6  each Topical Daily   feeding supplement (KATE FARMS STANDARD 1.4) Liquid  325 mL Oral BID BM   fluconazole   100 mg Oral Daily   insulin  aspart  0-15 Units Subcutaneous Q12H   octreotide   100 mcg Subcutaneous BID   OLANZapine   5 mg Oral Daily   mouth rinse  15 mL Mouth Rinse 4 times per day   pantoprazole  (PROTONIX ) IV  80  mg Intravenous Q12H   scopolamine   1 patch Transdermal Q72H   sucralfate   1 g Oral TID   Continuous Infusions:  promethazine  (PHENERGAN ) injection (IM or IVPB) 12.5 mg (12/31/24 2050)   TPN ADULT (ION) 90 mL/hr at 12/31/24 1832   TPN ADULT (ION)       LOS: 34 days    Time spent: 49 minutes spent on 01/01/2025 caring for this patient face-to-face including chart review, ordering labs/tests, documenting, discussion with nursing staff, consultants, updating family and interview/physical exam    Camellia PARAS Ulric Salzman, DO [facility] Available via Epic secure chat 7am-7pm After these hours, please refer to coverage provider listed on [email or web address] 01/01/2025, 12:42 PM   "

## 2025-01-01 NOTE — Progress Notes (Signed)
 PHARMACY - TOTAL PARENTERAL NUTRITION CONSULT NOTE   Indication: intolerance to enteral feeds  Patient Measurements: Height: 6' (182.9 cm) Weight: 73.5 kg (162 lb 0.6 oz) IBW/kg (Calculated) : 77.6 TPN AdjBW (KG): 70 Body mass index is 21.98 kg/m.  Assessment: 71 yoM admitted on 12/13 with intractable nausea and vomiting, dilated small bowel loops, and coffee-ground emesis. 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 He underwent EGD 12/16 showing esophagitis, gastric stenosis, stent placed, impaired peristalsis. Pharmacy was consulted to dose TPN on 12/20 for intolerance to enteral feeding.    - 1/12 Octreotide  resumed (as 100 mcg BID injections) - 1/14 TPN line broke during transport around 10a; D10 hung until new TPN started ~6p  Glucose / Insulin : No hx DM; A1c 5.7% in Dec - CBGs 132-151. Decadron  x 1 with chemo 1/14 - 2 units mSSI given yesterday Electrolytes: WNL & stable - last labs 1/16 Renal: SCr stable at baseline (<1); BUN elevated  - UOP adequate (1/15) Hepatic: Alk Phos and ALT slightly elevated and trending up (1/15); albumin  remains low but stable (1/15); otherwise WNL - TG WNL1/12 Intake / Output: minimal PO intake d/t continued nausea,  continues to have intermittent vomiting; no drains or recent diuretics - no output documented 1/16 - FLD 375 mls recorded 1/15 - LBM 1/12 GI Imaging: - 12/18 AXR: No evidence of obstruction; 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; dilated duodenum - 12/25 AXR: nonobstructive bowel gas pattern - 12/27 AXR: unchanged - 12/28 CT: nonspecific esophageal distention, enteritis, new small volume of ascites - 1/8 AXR: nonobstructive bowel gas pattern, otherwise unchanged GI Surgeries / Procedures:  -12/16 EGD: esophagitis  GI meds:   - Protonix  80 mg IV BID (increased 1/8) - Sucralfate  1g PO TID - Octreotide  100 mg SQ BID - PRN phenergan  - scopolamine  patch ( 1/16)   Central access: Implanted Port (01/22/24) TPN start  date: 12/20   Nutritional Goals: TPN at 90 ml/hr provides 119 g of protein and 2298 kcals per day  RD Assessment: Estimated Needs Total Energy Estimated Needs: 2100-2450 kcals Total Protein Estimated Needs: 105-120 grams Total Fluid Estimated Needs: >/= 2.1L  Current Nutrition:  FLD started 1/11 1/15 Ensure changed to [employer] 1.4 po BID (last administered 1/15) TPN  Plan:  Continue TPN at 90 ml/hr For discharge planning, please note that at least 2-3 days AFTER becoming stable on continuous TPN are required to fully transition patients to cyclic TPN. Electrolytes in TPN: no changes Na 75 mEq/L K 40 mEq/L Ca 2 mEq/L Mg 5 mEq/L Phos 20 mmol/L Cl:Ac 1:2 Add standard MVI and trace elements to TPN Chromium remains on hold d/t national shortage Continue mSSI q12hr Maintenance IV fluids per MD (none currently) TPN labs on Mon/Thurs  Thank you for allowing pharmacy to be a part of this patients care.  Marget Hench, PharmD Clinical Pharmacist 01/01/2025 9:40 AM

## 2025-01-02 DIAGNOSIS — K920 Hematemesis: Secondary | ICD-10-CM | POA: Diagnosis not present

## 2025-01-02 LAB — GLUCOSE, CAPILLARY
Glucose-Capillary: 123 mg/dL — ABNORMAL HIGH (ref 70–99)
Glucose-Capillary: 112 mg/dL — ABNORMAL HIGH (ref 70–99)

## 2025-01-02 MED ORDER — ONDANSETRON 4 MG PO TBDP
8.0000 mg | ORAL_TABLET | Freq: Three times a day (TID) | ORAL | Status: DC | PRN
  Administered 2025-01-02 – 2025-01-05 (×6): 8 mg via ORAL
  Filled 2025-01-02 (×6): qty 2

## 2025-01-02 MED ORDER — TRAVASOL 10 % IV SOLN
INTRAVENOUS | Status: AC
  Filled 2025-01-02: qty 1188

## 2025-01-02 NOTE — Progress Notes (Signed)
 PHARMACY - TOTAL PARENTERAL NUTRITION CONSULT NOTE   Indication: intolerance to enteral feeds  Patient Measurements: Height: 6' (182.9 cm) Weight: 68 kg (150 lb) IBW/kg (Calculated) : 77.6 TPN AdjBW (KG): 70 Body mass index is 20.34 kg/m.  Assessment: 42 yoM admitted on 12/13 with intractable nausea and vomiting, dilated small bowel loops, and coffee-ground emesis. 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 He underwent EGD 12/16 showing esophagitis, gastric stenosis, stent placed, impaired peristalsis. Pharmacy was consulted to dose TPN on 12/20 for intolerance to enteral feeding.    - 1/12 Octreotide  resumed (as 100 mcg BID injections) - 1/14 TPN line broke during transport around 10a; D10 hung until new TPN started ~6p  Glucose / Insulin : No hx DM; A1c 5.7% in Dec - CBGs stable/WNL. S/p Decadron  x 1 with chemo 1/14 - 2 units mSSI given yesterday Electrolytes: WNL & stable - last labs 1/16 Renal: SCr stable at baseline (<1); BUN elevated  - UOP adequate (1/15) Hepatic: Alk Phos and ALT slightly elevated and trending up (1/15); albumin  remains low/stable (1/15); otherwise WNL - TG WNL 1/12 Intake / Output: minimal PO intake d/t continued nausea,  continues to have intermittent vomiting; no drains or recent diuretics - PO intake: - UOP: x2 occurrences  - FLD: 375 mls last recorded 1/15 - LBM 1/12 GI Imaging: - 12/18 AXR: No evidence of obstruction; 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; dilated duodenum - 12/25 AXR: nonobstructive bowel gas pattern - 12/27 AXR: unchanged - 12/28 CT: nonspecific esophageal distention, enteritis, new small volume of ascites - 1/8 AXR: nonobstructive bowel gas pattern, otherwise unchanged GI Surgeries / Procedures:  -12/16 EGD: esophagitis  GI meds:   - Protonix  80 mg IV BID (increased 1/8) - Sucralfate  1g PO TID - Octreotide  100 mg SQ BID - PRN phenergan  - scopolamine  patch ( 1/16)   Central access: Implanted Port  (01/22/24) TPN start date: 12/20   Nutritional Goals: TPN at 90 ml/hr provides 119 g of protein and 2298 kcals per day  RD Assessment: Estimated Needs Total Energy Estimated Needs: 2100-2450 kcals Total Protein Estimated Needs: 105-120 grams Total Fluid Estimated Needs: >/= 2.1L  Current Nutrition:  FLD started 1/11 1/15 Ensure changed to [employer] 1.4 po BID (last administered 1/15) TPN  Plan:  Continue TPN at 90 ml/hr For discharge planning, please note that at least 2-3 days AFTER becoming stable on continuous TPN are required to fully transition patients to cyclic TPN. Electrolytes in TPN: no changes Na 75 mEq/L K 40 mEq/L Ca 2 mEq/L Mg 5 mEq/L Phos 20 mmol/L Cl:Ac 1:2 Add standard MVI and trace elements to TPN Chromium remains on hold d/t national shortage Continue mSSI q12hr Maintenance IV fluids per MD (none currently) TPN labs on Mon/Thurs and PRN  Thank you for allowing pharmacy to be a part of this patients care.  Marget Hench, PharmD Clinical Pharmacist 01/02/2025 9:36 AM

## 2025-01-02 NOTE — Progress Notes (Signed)
 IP PROGRESS NOTE  Subjective:   Henry May continues to have intermittent emesis, but nausea is significantly improved.  No hematemesis.  No bowel movement or flatus.  He is no longer wearing oxygen .  No new complaint. Objective: Vital signs in last 24 hours: Blood pressure 124/77, pulse 81, temperature 97.7 F (36.5 C), temperature source Oral, resp. rate 19, height 6' (1.829 m), weight 150 lb (68 kg), SpO2 97%.  Intake/Output from previous day: 01/17 0701 - 01/18 0700 In: 1171.3 [P.O.:240; I.V.:931.3] Out: 0   Physical Exam:  HEENT: Thrush has improved Cardiac: Regular rate and rhythm  Abdomen: Soft, nontender, no hepatomegaly Extremities: No leg edema  Portacath/PICC-without erythema  Lab Results: Recent Labs    12/31/24 0256  WBC 3.6*  HGB 7.9*  HCT 25.4*  PLT 284      BMET Recent Labs    12/31/24 0256  NA 145  K 4.2  CL 110  CO2 28  GLUCOSE 156*  BUN 41*  CREATININE 0.69  CALCIUM  8.1*    Lab Results  Component Value Date   CEA 3.57 01/15/2024      Medications: I have reviewed the patient's current medications.  Assessment/Plan: Gastric cancer 11/21/2023 EGD-gastric body with infiltrative looking lesion; biopsy shows involvement of a hypercellular lesion that suggests diffuse carcinoma by morphology CTs abdomen/pelvis 11/28/2023-diffuse fatty infiltration of the liver; gastric wall thickening; lymph nodes up to 6 mm in diameter near the stomach wall. 12/31/2023 upper endoscopy-large diffuse friable infiltrative polypoid and ulcerated circumferential mass found in the gastric body.  Scope was passed beyond the mass with normal antrum/pylorus.  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  Negative for HER2 (1+); mismatch repair protein IHC normal; PD-L1 CPS 0%; CLDN18 positive: 90% of tumor cells with 2+/3+ membrane staining PET scan 01/07/2024-circumferential hypermetabolic  gastric mucosal thickening.  Ill-defined hypermetabolic lymph nodes in the gastrohepatic ligament.  Intense hypermetabolic activity associated with the right lobe of the thyroid  gland.  Small hypermetabolic right axillary node favored reactive. 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  Gastric wall thickening slightly improved. Cycle 7 oxaliplatin , zolbetuximab 05/05/2024, Fulphila  Cycle 8 zolbetuximab 05/20/2024, oxaliplatin  held due to neuropathy, no Fulphila  Cycle 9 oxaliplatin /zolbetuximab 06/03/2024, Fulphila  Cycle 10 oxaliplatin /zolbetuximab 06/17/2024, Fulphila  Cycle 11 oxaliplatin /zolbetuximab 06/30/2024, Fulphila , oxaliplatin  dose reduced Cycle 12 zolbetuximab 07/15/2024, oxaliplatin  held secondary to neuropathy Cycle 13 zolbetuximab 07/29/2024, oxaliplatin  held secondary to neuropathy Cycle 14 zolbetuximab 08/26/2024, oxaliplatin  held secondary to neuropathy 09/01/2024 CTs-mild distal gastric wall thickening without discrete mass.  No metastatic disease. Cycle 15 zolbetuximab 09/09/2024, oxaliplatin  held due to neuropathy Cycle 16 zolbetuximab 09/30/2024, oxaliplatin  held due to neuropathy Cycle 17 zolbetuximab 10/21/2024, oxaliplatin  held due to neuropathy 11/03/2024 CT abdomen/pelvis: Diffuse mild wall thickening of the distal gastric body and antrum-stable, mild increase in a right gastric lymph node small  volume ascites without omental nodularity, proximal jejunal mural thickening 11/04/2024 upper GI: Narrowing of the gastric antrum, normal gastric emptying, dilated duodenum and proximal jejunum without evidence of a bowel obstruction 11/18/2024 upper endoscopy: Tumor in the gastric body and  antrum with antral narrowing, dilated duodenum without evidence of mass or obstruction, esophagitis: Biopsy of the gastric mass-poorly differentiated adenocarcinoma with focal signet ring morphology, esophagus biopsy: Squamous mucosa with focal ulceration, Foundation 1-BRCA2 subclonal, ATM, HRD signature positive, MSS, tumor mutation burden 6, [facility] 1 Cycle 1 weekly cisplatin  11/26/2024 Cycle 2 weekly cisplatin  12/07/2024 Cycle 3 weekly cisplatin  12/15/2024 Radiation to the gastric mass 12/17/2024 Cycle 4 weekly cisplatin  12/22/2024 12/12/2024 CTs: Pneumomediastinum extending into the neck, lower lobe consolidation, esophagus distention, air in the bladder, new mildly enlarged right hilar node, new small volume ascites Cycle 5 weekly cisplatin  12/29/2024 Early satiety, postprandial abdominal pain, weight loss secondary to #1 History of bilateral lower extremity DVT 2021-anticoagulation discontinued due to massive retroperitoneal bleed, IVC filter placed 08/24/2020 Hospitalized with acute respiratory failure due to COVID-19 08/06/2020 - 09/04/2020 History of massive retroperitoneal bleed secondary to anticoagulation September 2021 Thyroid  ultrasound 01/13/2024-no abnormal nodule identified in the right lobe.  1.1 cm thyroid  isthmus nodule meets criteria for 1 year follow-up ultrasound. Admission 02/01/2024 with increased abdominal pain/ascites Hospitalization with altered mental status 02/05/2024 through 02/13/2024; question rare case of 5-FU psychosis.  Improved 02/16/2024.  Mental status at baseline 02/23/2024. Neutropenia 02/16/2024 Mucositis 02/16/2024.  Resolved 02/23/2024 Right knee pain/edema/erythema 03/01/2024-Doppler study  negative for DVT History of gout Oxaliplatin  neuropathy-prolonged cold sensitivity, diminished vibratory sense following cycle 5 chemotherapy.  Persistent cold sensitivity 05/20/2024, oxaliplatin  held.  Cold sensitivity resolved 06/03/2024, oxaliplatin  resumed.  Mild loss of vibratory sense, oxaliplatin  dose reduced 06/29/2024 Admission 11/28/2024 with intractable nausea and vomiting 11/27/2024 CT Abdo/pelvis: Bilateral lower lobe aspiration/pneumonia, thick-walled gastric body/antrum, dilated proximal small bowel, small volume pelvic ascites 11/30/2024 EGD: Malignant appearing severe stenosis at the gastric antrum-stent placed, dilated duodenum with retained fluid 15.  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  There is evidence of disease progression with persistent/progressive tumor in the stomach and intractable nausea/vomiting.  The omental disease on presentation last year remains significantly improved, by CT criteria.  He responded well to initial treatment with oxaliplatin  based chemotherapy.  He had neuropathy symptoms from oxaliplatin .  He had psychosis following 5-fluorouracil .  He began a trial of salvage therapy with cisplatin  on 11/26/2024.  He completed cycle 5 on 12/29/2024.  He completed palliative radiation to the gastric mass 12/30/2024.   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  There was retained fluid in the stomach and duodenum.  A gastric antral stent was placed.  He continues to have nausea and vomiting of liquids despite placement of the gastric antral stent.  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  The nausea partially improved with octreotide .  He feels octreotide  and IV Phenergan  help the nausea.  Oral Phenergan , lorazepam , Decadron ,  metoclopramide , and ondansetron  have not helped.  A scopolamine  patch and Zyprexa  were added 01/01/2024.  Nausea has improved.  Hematemesis has improved following palliative radiation.  He received 2 units of packed red blood cells on 12/23/2024.  He began palliative radiation to the gastric mass on 12/17/2024 and completed the final treatment with radiation 12/30/2024.  Mr Pietila  developed CT evidence of progressive aspiration pneumonia.  He also had pneumomediastinum with subcutaneous emphysema extending to the neck on a CT 12/12/2024.  The significance of the emphysematous findings is unclear.  I reviewed the CT images.  He may have a tumor related microperforation or perforation related to emesis.  Mr Thain has been evaluated by the palliative care service.  The family has met with the palliative care team. They understand the poor prognosis.   He confirmed his desire to continue treatment.  We submitted tissue for molecular testing to look for options for targeted treatment and assess the tumor mutation burden.  The results reveal a subclonal BRCA2 alteration, a positive HRD signal, and an ATM alteration.  He may be a candidate for a PARP inhibitor if his clinical status improves.  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  We will attempt to arrange genetic testing to rule out a germline BRCA2 or CDH 1 mutation.  Mr Votta has changed insurance plans.  [place] is no longer in network.  He and his family are very concerned about the lack of insurance coverage.   Recommendations: Continue TPN, liquids as tolerated Continue current antiemetic regimen Packed red blood cells as needed for severe anemia Out of bed, ambulate as tolerated Continue octreotide , convert to Sandostatin  as an outpatient Discharge planning to include home TPN 7.  Obtain germline genetic testing, we are arranging for testing at the main cancer center on the day of discharge from the hospital 8.  Outpatient follow-up will be  scheduled at the Cancer center   LOS: 35 days   Arley Hof, MD   01/02/2025, 7:56 AM

## 2025-01-02 NOTE — Progress Notes (Signed)
 " PROGRESS NOTE    Henry May  FMW:[id number] DOB: 1966-07-17 DOA: 11/27/2024 PCP: [name], DO    Brief Narrative:   Henry May is a 59 y.o. male with past medical history significant for metastatic gastric cancer currently on chemotherapy/radiation (oncology, Dr. Cloretta), intractable nausea/vomiting due to progressive gastric cancer/omental disease, gastric antral stenosis s/p stent, intermittent hematemesis secondary to gastric mass, DVT s/p IVC filter who presented to [facility] on 12/13 with nausea/vomiting, coffee-ground emesis, weakness.  Patient underwent recent EGD 12/4 with LA grade D esophagitis without bleeding, large infiltrative sessile and ulcerated circumferential mass with contact of bleeding in the gastric body/antrum with antral narrowing and luminal diameter approxione 0.5 cm.  In the ED, temperature 98.5 F, HR 95, RR 23, BP 133/92, SpO2 93% on room air.  WBC 7.1, hemoglobin 14.5, platelet count 274.  Sodium 135, potassium 4.0, chloride 97, CO2 25, glucose 122, BUN 929, creatinine 0.83.  Lipase 171.  AST 25, ALT 62, total bilirubin 0.6.  High sensitive troponin less than 15.  INR 1.1.  CT abdomen/pelvis with contrast with dilated proximal small bowel suggesting small bowel obstruction with a discrete transition point not seen, thick walled gastric body/antrum with history of gastric cancer without definite focal mass, mild nodularity along the pylorus, scarring with superimposed patchy peribronchial vascular nodularity bilateral lower lobes right greater than left suggestive of mild aspiration/pneumonia.  Gastroenterology, general surgery, oncology consulted.  TRH consulted for admission and patient was transferred to [facility] for further evaluation management.  Patient underwent EGD with gastric stent placement on 12/16, unable to tolerate diet and started on TPN.  Plan to discharge on TPN but nausea and vomiting progress following stopping  of octreotide  and Decadron  with hemoptysis, restarted on octreotide  infusion.  CT chest/abdomen/pelvis with new patchy substernal pneumomediastinum of indeterminate source, patchy consolidation in lower lobes, posterior LUL, esophageal distention with retained/reflux fluid in thoracic inlet, emphysematous cystitis and circumferential gastric thickening with adjacent stranding change, antropyloric stenting and continued duodenal dilation, thickening of jejunal folds.  Ongoing bleeding felt secondary to metastatic gastric carcinoma.  Patient was continued on chemotherapy and started on XRT to decrease tumor burden and provide symptomatic relief.  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  CT abdomen/pelvis 12/13 with thick walled gastric body and antrum without a definitive mass with nodularity along the pylorus and proximal dilated small bowel loops, questionable mild obstruction without discrete transition point.  Underwent EGD with gastric stent placement on 12/16.  Seen by general surgery, no indication for surgical intervention at this time and signed off 12/19.  Developed hematemesis 12/25 with or without hemoptysis and octreotide  was restarted.  Repeat CT abdomen/pelvis 12/28 with circumferential gastric thickening and antropyloric stent with continued dilation of the duodenum 6 mL centimeters caliber without any appreciable transition, thickened jejunal folds consistent with nonspecific enteritis and small-volume ascites.  Seen by GI, limited role of endoscopy with possible malignant bleeding and potential retroperitoneal air tracking, signed off 1/2.  Oral/rectal Phenergan , lorazepam , Decadron , Compazine , Zofran   have not helped his nausea and vomiting. -- Medical oncology, radiation oncology following, appreciate assistance -- Completed 10 fractions of radiation 12/30/2024. -- Received cycle 5 of weekly cisplatin  12/29/2024 -- Dilaudid  0.5 mg IV every 4 hours as needed severe pain -- Octreotide   100 mcg [place] BID > plan to change to Sandostatin  with TPN on discharge per oncology -- Protonix  80 mg IV q12h -- Carafate  1 g p.o. 3 times daily -- Phenergan  12.5 mg IV PRN nausea/vomiting -- Scopolamine  patch -- Zyprexa  5 mg p.o. daily -- Continue TPN, pharmacy consult for dosing/monitoring -- TOC following for discharge planning -- Palliative care continues to follow intermittently, overall prognosis remains poor/grim; would recommend transitioning to hospice care  Oral buccal candidiasis Started on Diflucan  by oncology 1/17  Aspiration pneumonitis/pneumonia Pneumomediastinum noted on CT chest.  Completed 7-day course of Unasyn , completed on 12/17/2024.  Leukopenia Stable, continue to monitor closely on chemotherapy  Anemia Iron  deficiency -- Hgb 14.5>>6.7>8.8>>6.3>>9.7>>7.3>>9.1>>8.8>9.5>8.9 -- Transfused 8 unit PRBCs during his hospitalization, last 12/23/2024 -- Continue monitor hemoglobin intermittently, transfuse for hemoglobin less than 7.0  Type 2 diabetes mellitus Diet controlled, hemoglobin A1c 5.7%.  History of DVT S/p IVC filter  Elevated lipase -- Lipase 171>53  Severe malnutrition Body mass index is 20.34 kg/m. Nutrition Status: Nutrition Problem: Severe Malnutrition Etiology: chronic illness (gastric cancer) Signs/Symptoms: severe fat depletion, severe muscle depletion, percent weight loss (24% weight loss in less than 3 months) Percent weight loss: 24 % (in < 3 months) Interventions: Refer to RD note for recommendations -- Continue TPN, full liquid diet as tolerates   DVT prophylaxis: Place and maintain sequential compression device Start: 12/15/24 1907 SCDs Start: 11/28/24  1042    Code Status: Limited: Do not attempt resuscitation (DNR) -DNR-LIMITED -Do Not Intubate/DNI  Family Communication:   Disposition Plan:  Level of care: Progressive Status is: Inpatient Remains inpatient appropriate because: TPN, needs home TPN set up; overall poor/grim prognosis with recommendation of transitioning to hospice care as he continues with significant nausea and vomiting despite aggressive treatment with antiemetics    Consultants:  Medical oncology, Dr. Cloretta Radiation oncology General Surgery - signed off 12/19 [place] [facility] -signed off 1/2 Palliative care  Procedures:  EGD  Antimicrobials:  Unasyn  12/27 - 1/2   Subjective: Patient seen examined bedside, lying in bed.  No family present this morning.  Nausea and vomiting much improved after starting scopolamine  patch and Zyprexa  yesterday.  Was able to walk several times yesterday around the unit.  Discussed with medical oncology, Dr. Cloretta this morning.  No other specific questions or concerns at this time.  Denies headache, no visual changes, no chest pain, no palpitations, no shortness of breath, no abdominal pain, no fever/chills/night sweats, no diarrhea.   Overall prognosis poor given advanced malignancy.  Objective: Vitals:   01/01/25 1331 01/01/25 2231 01/02/25 0500 01/02/25 0549  BP: 115/72 120/76  124/77  Pulse: 86 90  81  Resp: 12 18  19   Temp: 97.9 F (36.6 C) 98 F (36.7 C)  97.7 F (36.5 C)  TempSrc: Oral Oral  Oral  SpO2: 97% 97%  97%  Weight:   68 kg   Height:        Intake/Output Summary (Last 24 hours) at 01/02/2025 1151 Last data filed at 01/02/2025 0404 Gross per 24 hour  Intake 1171.25 ml  Output 0 ml  Net 1171.25 ml   Filed Weights   12/28/24 0500 01/01/25 0500 01/02/25 0500  Weight: 72.1 kg 73.5 kg 68 kg    Examination:  Physical Exam: GEN: NAD, alert and oriented x 3, chronically ill in appearance, appears older than stated age HEENT: NCAT, PERRL,  EOMI, sclera clear PULM: CTAB w/o wheezes/crackles, normal respiratory effort, SpO2 94% on 4 L nasal cannula CV:  RRR w/o M/G/R GI: abd soft, NTND, + BS MSK: no peripheral edema NEURO: No focal neurological deficit PSYCH: normal mood/affect Integumentary: No concerning rashes/lesions/wounds noted on exposed skin surfaces    Data Reviewed: I have personally reviewed following labs and imaging studies  CBC: Recent Labs  Lab 12/27/24 0341 12/28/24 0409 12/29/24 0835 12/30/24 0415 12/31/24 0256  WBC 2.6* 2.8* 3.2* 4.3 3.6*  NEUTROABS  --   --  2.1  --   --   HGB 9.0* 8.8* 9.5* 8.9* 7.9*  HCT 28.4* 27.9* 30.8* 29.2* 25.4*  MCV 95.6 96.2 97.8 97.7 97.7  PLT 264 275 292 296 284   Basic Metabolic Panel: Recent Labs  Lab 12/27/24 0341 12/28/24 0409 12/30/24 0415 12/31/24 0256  NA 143 143 144 145  K 3.9 4.1 4.2 4.2  CL 110 109 111 110  CO2 23 23 25 28   GLUCOSE 144* 187* 173* 156*  BUN 37* 33* 30* 41*  CREATININE 0.68 0.65 0.59* 0.69  CALCIUM  8.6* 8.5* 8.6* 8.1*  MG 2.4 2.3 2.4  --   PHOS 3.1  --  3.1  --    GFR: Estimated Creatinine Clearance: 94.4 mL/min (by C-G formula based on SCr of 0.69 mg/dL). Liver Function Tests: Recent Labs  Lab 12/27/24 0341 12/30/24 0415  AST 21 25  ALT 25 51*  ALKPHOS 141* 192*  BILITOT 0.4 0.3  PROT 6.0* 6.5  ALBUMIN  2.6* 2.8*   No results for input(s): LIPASE, AMYLASE in the last 168 hours. No results for input(s): AMMONIA in the last 168 hours. Coagulation Profile: No results for input(s): INR, PROTIME in the last 168 hours. Cardiac Enzymes: No results for input(s): CKTOTAL, CKMB, CKMBINDEX, TROPONINI in the last 168 hours. BNP (last 3 results) Recent Labs    12/11/24 1208  PROBNP 244.0   HbA1C: No results for input(s): HGBA1C in the last 72 hours. CBG: Recent Labs  Lab 12/31/24 0625 12/31/24 1216 12/31/24 1818 12/31/24 2145 01/01/25 2116  GLUCAP 162* 151* 132* 135* 136*   Lipid Profile: No  results for input(s): CHOL, HDL, LDLCALC, TRIG, CHOLHDL, LDLDIRECT in the last 72 hours.  Thyroid  Function Tests: No results for input(s): TSH, T4TOTAL, FREET4, T3FREE, THYROIDAB in the last 72 hours. Anemia Panel: No results for input(s): VITAMINB12, FOLATE, FERRITIN, TIBC, IRON , RETICCTPCT in the last 72 hours. Sepsis Labs: No results for input(s): PROCALCITON, LATICACIDVEN in the last 168 hours.  No results found for this or any previous visit (from the past 240 hours).       Radiology Studies: No results found.       Scheduled Meds:  Chlorhexidine  Gluconate Cloth  6 each Topical Daily   feeding supplement (KATE FARMS STANDARD 1.4) Liquid  325 mL Oral BID BM   fluconazole   100 mg Oral Daily   insulin  aspart  0-15 Units Subcutaneous Q12H   octreotide   100 mcg Subcutaneous BID   OLANZapine   5 mg Oral Daily   mouth rinse  15 mL Mouth Rinse 4 times per day   pantoprazole   80 mg Oral BID   scopolamine   1 patch Transdermal Q72H   sucralfate   1 g Oral TID   Continuous Infusions:  promethazine  (PHENERGAN ) injection (IM or IVPB) 12.5 mg (12/31/24 2050)   TPN ADULT (ION) 90 mL/hr at 01/02/25 0404   TPN ADULT (ION)       LOS: 35 days    Time spent: 49 minutes spent on 01/02/2025 caring for this patient face-to-face including chart review, ordering labs/tests,  documenting, discussion with nursing staff, consultants, updating family and interview/physical exam    Camellia PARAS Kalyan Barabas, DO [facility] Available via Epic secure chat 7am-7pm After these hours, please refer to coverage provider listed on [email or web address] 01/02/2025, 11:51 AM   "

## 2025-01-03 DIAGNOSIS — K920 Hematemesis: Secondary | ICD-10-CM | POA: Diagnosis not present

## 2025-01-03 LAB — CBC
HCT: 27.8 % — ABNORMAL LOW (ref 39.0–52.0)
Hemoglobin: 8.4 g/dL — ABNORMAL LOW (ref 13.0–17.0)
MCH: 29.9 pg (ref 26.0–34.0)
MCHC: 30.2 g/dL (ref 30.0–36.0)
MCV: 98.9 fL (ref 80.0–100.0)
Platelets: 299 K/uL (ref 150–400)
RBC: 2.81 MIL/uL — ABNORMAL LOW (ref 4.22–5.81)
RDW: 16.7 % — ABNORMAL HIGH (ref 11.5–15.5)
WBC: 3.3 K/uL — ABNORMAL LOW (ref 4.0–10.5)
nRBC: 1.2 % — ABNORMAL HIGH (ref 0.0–0.2)

## 2025-01-03 LAB — COMPREHENSIVE METABOLIC PANEL WITH GFR
ALT: 44 U/L (ref 0–44)
AST: 30 U/L (ref 15–41)
Albumin: 3 g/dL — ABNORMAL LOW (ref 3.5–5.0)
Alkaline Phosphatase: 225 U/L — ABNORMAL HIGH (ref 38–126)
Anion gap: 8 (ref 5–15)
BUN: 34 mg/dL — ABNORMAL HIGH (ref 6–20)
CO2: 33 mmol/L — ABNORMAL HIGH (ref 22–32)
Calcium: 8.9 mg/dL (ref 8.9–10.3)
Chloride: 110 mmol/L (ref 98–111)
Creatinine, Ser: 0.71 mg/dL (ref 0.61–1.24)
GFR, Estimated: >60 mL/min
Glucose, Bld: 130 mg/dL — ABNORMAL HIGH (ref 70–99)
Potassium: 3.5 mmol/L (ref 3.5–5.1)
Sodium: 150 mmol/L — ABNORMAL HIGH (ref 135–145)
Total Bilirubin: 0.3 mg/dL (ref 0.0–1.2)
Total Protein: 6.8 g/dL (ref 6.5–8.1)

## 2025-01-03 LAB — GLUCOSE, CAPILLARY
Glucose-Capillary: 112 mg/dL — ABNORMAL HIGH (ref 70–99)
Glucose-Capillary: 138 mg/dL — ABNORMAL HIGH (ref 70–99)

## 2025-01-03 LAB — MAGNESIUM: Magnesium: 2.4 mg/dL (ref 1.7–2.4)

## 2025-01-03 LAB — TRIGLYCERIDES: Triglycerides: 159 mg/dL — ABNORMAL HIGH

## 2025-01-03 LAB — PHOSPHORUS: Phosphorus: 3.4 mg/dL (ref 2.5–4.6)

## 2025-01-03 MED ORDER — POTASSIUM CHLORIDE 20 MEQ PO PACK
40.0000 meq | PACK | Freq: Once | ORAL | Status: AC
  Administered 2025-01-03: 40 meq via ORAL
  Filled 2025-01-03: qty 2

## 2025-01-03 MED ORDER — TRAVASOL 10 % IV SOLN
INTRAVENOUS | Status: AC
  Filled 2025-01-03: qty 1188

## 2025-01-03 MED ORDER — SENNOSIDES-DOCUSATE SODIUM 8.6-50 MG PO TABS
1.0000 | ORAL_TABLET | Freq: Two times a day (BID) | ORAL | Status: DC
  Administered 2025-01-03 – 2025-01-05 (×5): 1 via ORAL
  Filled 2025-01-03 (×5): qty 1

## 2025-01-03 MED ORDER — BISACODYL 10 MG RE SUPP: 10.0000 mg | Freq: Every day | RECTAL | Status: DC | PRN

## 2025-01-03 MED ORDER — POTASSIUM CHLORIDE 10 MEQ/50ML IV SOLN
10.0000 meq | INTRAVENOUS | Status: DC
  Filled 2025-01-03 (×4): qty 50

## 2025-01-03 MED ORDER — GUAIFENESIN-DM 100-10 MG/5ML PO SYRP
5.0000 mL | ORAL_SOLUTION | ORAL | Status: DC | PRN
  Administered 2025-01-03: 5 mL via ORAL
  Filled 2025-01-03: qty 10

## 2025-01-03 MED ORDER — MELATONIN 3 MG PO TABS
3.0000 mg | ORAL_TABLET | Freq: Every day | ORAL | Status: DC
  Administered 2025-01-03 – 2025-01-04 (×2): 3 mg via ORAL
  Filled 2025-01-03 (×2): qty 1

## 2025-01-03 NOTE — TOC Progression Note (Signed)
 Transition of Care [facility]) - Progression Note    Patient Details  Name: Henry May MRN: [id number] Date of Birth: [birth year]  [facility] CM/SW Contact  Tawni CHRISTELLA Eva, LCSW Phone Number: 01/03/2025, 3:48 PM  Clinical Narrative:     CSW spoke with Holley at [place], who stated that the pt can discharge home on Wednesday, as they can have an HHRN see the pt that day if medically stable. ICM to follow.                   Expected Discharge Plan and Services                                               Social Drivers of Health (SDOH) Interventions SDOH Screenings   Food Insecurity: Patient Declined (11/28/2024)  Housing: Unknown (11/28/2024)  Transportation Needs: Patient Declined (11/28/2024)  Utilities: Patient Declined (11/28/2024)  Depression (PHQ2-9): Medium Risk (11/26/2024)  Social Connections: Unknown (04/30/2022)   Received from [facility]  Tobacco Use: Medium Risk (11/30/2024)    Readmission Risk Interventions    12/06/2024   10:13 AM 02/07/2024    8:40 AM  Readmission Risk Prevention Plan  Transportation Screening Complete Complete  HRI or Home Care Consult Complete Complete  Social Work Consult for Recovery Care Planning/Counseling Complete Complete  Palliative Care Screening Not Applicable Not Applicable  Medication Review Oceanographer) Complete Complete

## 2025-01-03 NOTE — Progress Notes (Signed)
 PHARMACY - TOTAL PARENTERAL NUTRITION CONSULT NOTE   Indication: intolerance to enteral feeds  Patient Measurements: Height: 6' (182.9 cm) Weight: 74.2 kg (163 lb 9.3 oz) IBW/kg (Calculated) : 77.6 TPN AdjBW (KG): 70 Body mass index is 22.19 kg/m.  Assessment: Henry May admitted on 12/13 with intractable nausea and vomiting, dilated small bowel loops, and coffee-ground emesis. 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 He underwent EGD 12/16 showing esophagitis, gastric stenosis, stent placed, impaired peristalsis. Pharmacy was consulted to dose TPN on 12/20 for intolerance to enteral feeding.    - 1/12 Octreotide  resumed (as 100 mcg BID injections) - 1/14 TPN line broke during transport around 10a; D10 hung until new TPN started ~6p  Glucose / Insulin : No hx DM; A1c 5.7% in Dec - CBGs stable/WNL. S/p Decadron  x 1 with chemo 1/14 - 2 units mSSI given yesterday Electrolytes:  Na elevated, K low at 3.5, remaining WNL Renal: SCr stable at baseline (<1); BUN elevated  Hepatic: Alk Phos elevated and trending up; albumin  remains low/stable; otherwise WNL - TG slightly elevated 1/19 Intake / Output: minimal PO intake d/t continued nausea,  continues to have intermittent vomiting; no drains or recent diuretics - PO intake: 0ml recorded - UOP: x1 occurrences  - feeding supplement: 325 mls x 2 on 1/18 - LBM 1/15 GI Imaging: - 12/18 AXR: No evidence of obstruction; 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; dilated duodenum - 12/25 AXR: nonobstructive bowel gas pattern - 12/27 AXR: unchanged - 12/28 CT: nonspecific esophageal distention, enteritis, new small volume of ascites - 1/8 AXR: nonobstructive bowel gas pattern, otherwise unchanged GI Surgeries / Procedures:  -12/16 EGD: esophagitis  GI meds:   - Protonix  80 mg PO BID (increased 1/8) - Sucralfate  1g PO TID - Octreotide  100 mg SQ BID - PRN phenergan  - scopolamine  patch ( 1/16)   Central access: Implanted Port  (01/22/24) TPN start date: 12/20   Nutritional Goals: TPN at 90 ml/hr provides 119 g of protein and 2298 kcals per day  RD Assessment: Estimated Needs Total Energy Estimated Needs: 2100-2450 kcals Total Protein Estimated Needs: 105-120 grams Total Fluid Estimated Needs: >/= 2.1L  Current Nutrition:  FLD started 1/11 1/15 Ensure changed to [employer] 1.4 po BID (x 2 administered on 1/18)  Plan:  Continue TPN at 90 ml/hr For discharge planning, please note that at least 2-3 days AFTER becoming stable on continuous TPN are required to fully transition patients to cyclic TPN. Electrolytes in TPN: no changes Na 50 mEq/L - decreased K 50 mEq/L - increased Ca 2 mEq/L Mg 5 mEq/L Phos 20 mmol/L Cl:Ac 1:2 Add standard MVI and trace elements to TPN Chromium remains on hold d/t national shortage Continue mSSI q12hr Maintenance IV fluids per MD (none currently) TPN labs on Mon/Thurs and PRN, BMP tomorrow am   Thank you for allowing pharmacy to be a part of this patients care.  Eleanor EMERSON Agent, PharmD, BCPS Clinical Pharmacist [place] 01/03/2025 8:09 AM

## 2025-01-03 NOTE — Progress Notes (Signed)
 " PROGRESS NOTE    Henry May  FMW:[id number] DOB: May 19, 1966 DOA: 11/27/2024 PCP: [name], DO    Brief Narrative:   Henry May is a 59 y.o. male with past medical history significant for metastatic gastric cancer currently on chemotherapy/radiation (oncology, Dr. Cloretta), intractable nausea/vomiting due to progressive gastric cancer/omental disease, gastric antral stenosis s/p stent, intermittent hematemesis secondary to gastric mass, DVT s/p IVC filter who presented to [facility] on 12/13 with nausea/vomiting, coffee-ground emesis, weakness.  Patient underwent recent EGD 12/4 with LA grade D esophagitis without bleeding, large infiltrative sessile and ulcerated circumferential mass with contact of bleeding in the gastric body/antrum with antral narrowing and luminal diameter approxione 0.5 cm.  In the ED, temperature 98.5 F, HR 95, RR 23, BP 133/92, SpO2 93% on room air.  WBC 7.1, hemoglobin 14.5, platelet count 274.  Sodium 135, potassium 4.0, chloride 97, CO2 25, glucose 122, BUN 929, creatinine 0.83.  Lipase 171.  AST 25, ALT 62, total bilirubin 0.6.  High sensitive troponin less than 15.  INR 1.1.  CT abdomen/pelvis with contrast with dilated proximal small bowel suggesting small bowel obstruction with a discrete transition point not seen, thick walled gastric body/antrum with history of gastric cancer without definite focal mass, mild nodularity along the pylorus, scarring with superimposed patchy peribronchial vascular nodularity bilateral lower lobes right greater than left suggestive of mild aspiration/pneumonia.  Gastroenterology, general surgery, oncology consulted.  TRH consulted for admission and patient was transferred to [facility] for further evaluation management.  Patient underwent EGD with gastric stent placement on 12/16, unable to tolerate diet and started on TPN.  Plan to discharge on TPN but nausea and vomiting progress following stopping  of octreotide  and Decadron  with hemoptysis, restarted on octreotide  infusion.  CT chest/abdomen/pelvis with new patchy substernal pneumomediastinum of indeterminate source, patchy consolidation in lower lobes, posterior LUL, esophageal distention with retained/reflux fluid in thoracic inlet, emphysematous cystitis and circumferential gastric thickening with adjacent stranding change, antropyloric stenting and continued duodenal dilation, thickening of jejunal folds.  Ongoing bleeding felt secondary to metastatic gastric carcinoma.  Patient was continued on chemotherapy and started on XRT to decrease tumor burden and provide symptomatic relief.  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  CT abdomen/pelvis 12/13 with thick walled gastric body and antrum without a definitive mass with nodularity along the pylorus and proximal dilated small bowel loops, questionable mild obstruction without discrete transition point.  Underwent EGD with gastric stent placement on 12/16.  Seen by general surgery, no indication for surgical intervention at this time and signed off 12/19.  Developed hematemesis 12/25 with or without hemoptysis and octreotide  was restarted.  Repeat CT abdomen/pelvis 12/28 with circumferential gastric thickening and antropyloric stent with continued dilation of the duodenum 6 mL centimeters caliber without any appreciable transition, thickened jejunal folds consistent with nonspecific enteritis and small-volume ascites.  Seen by GI, limited role of endoscopy with possible malignant bleeding and potential retroperitoneal air tracking, signed off 1/2.  Oral/rectal Phenergan , lorazepam , Decadron , Compazine , Zofran   have not helped his nausea and vomiting. -- Medical oncology, radiation oncology following, appreciate assistance -- Completed 10 fractions of radiation 12/30/2024. -- Received cycle 5 of weekly cisplatin  12/29/2024 -- Dilaudid  0.5 mg IV every 4 hours as needed severe pain -- Octreotide   100 mcg [place] BID > plan to change to Sandostatin  with TPN on discharge per oncology -- Protonix  80 mg IV q12h -- Carafate  1 g p.o. 3 times daily -- Phenergan  12.5 mg IV PRN nausea/vomiting -- Scopolamine  patch -- Zyprexa  5 mg p.o. daily -- Continue TPN, pharmacy consult for dosing/monitoring -- TOC following for discharge planning -- Palliative care continues to follow intermittently, overall prognosis remains poor/grim; would recommend transitioning to hospice care  Oral buccal candidiasis Started on Diflucan  by oncology 1/17  Aspiration pneumonitis/pneumonia Pneumomediastinum noted on CT chest.  Completed 7-day course of Unasyn , completed on 12/17/2024.  Leukopenia Stable, continue to monitor closely on chemotherapy  Anemia Iron  deficiency -- Hgb 14.5>>6.7>8.8>>6.3>>9.7>>7.3>>9.1>>8.8>9.5>8.9>8.4 -- Transfused 8 unit PRBCs during his hospitalization, last 12/23/2024 -- Continue monitor hemoglobin intermittently, transfuse for hemoglobin less than 7.0  Type 2 diabetes mellitus Diet controlled, hemoglobin A1c 5.7%.  History of DVT S/p IVC filter  Elevated lipase -- Lipase 171>53  Severe malnutrition Body mass index is 22.19 kg/m. Nutrition Status: Nutrition Problem: Severe Malnutrition Etiology: chronic illness (gastric cancer) Signs/Symptoms: severe fat depletion, severe muscle depletion, percent weight loss (24% weight loss in less than 3 months) Percent weight loss: 24 % (in < 3 months) Interventions: Refer to RD note for recommendations -- Continue TPN, full liquid diet as tolerates   DVT prophylaxis: Place and maintain sequential compression device Start: 12/15/24 1907 SCDs Start:  11/28/24 1042    Code Status: Limited: Do not attempt resuscitation (DNR) -DNR-LIMITED -Do Not Intubate/DNI  Family Communication:   Disposition Plan:  Level of care: Progressive Status is: Inpatient Remains inpatient appropriate because: TPN, needs home TPN set up; overall poor/grim prognosis with recommendation of transitioning to hospice care as he continues with significant nausea and vomiting despite aggressive treatment with antiemetics    Consultants:  Medical oncology, Dr. Cloretta Radiation oncology General Surgery - signed off 12/19 [place] [facility] -signed off 1/2 Palliative care  Procedures:  EGD  Antimicrobials:  Unasyn  12/27 - 1/2   Subjective: Patient seen examined bedside, lying in bed.  Spouse and daughter present at bedside this morning.  Denies nausea, 1 episode of vomiting reported.  Family inquiring into potential change in his chemotherapy regimen, deferring to oncology; but did discuss with patient and family that Dr. Cloretta was planning on running additional testing to determine if additional therapies would be warranted.  No other specific questions or concerns at this time.  Denies headache, no visual changes, no chest pain, no palpitations, no shortness of breath, no abdominal pain, no fever/chills/night sweats, no diarrhea.   Overall prognosis poor given advanced malignancy.  Objective: Vitals:   01/02/25 1501 01/02/25 1955 01/03/25 0500 01/03/25 0527  BP: 120/78 117/80  115/77  Pulse: 84 84  83  Resp:  14  16  Temp: 97.9 F (36.6 C) 97.9 F (36.6 C)  98.6 F (37 C)  TempSrc: Oral Oral  Oral  SpO2: 99% 95%  97%  Weight:   74.2 kg   Height:        Intake/Output Summary (Last 24 hours) at 01/03/2025 1115 Last data filed at 01/03/2025 0344 Gross per 24 hour  Intake 2073.91 ml  Output 0 ml  Net 2073.91 ml   Filed Weights   01/01/25 0500 01/02/25 0500 01/03/25 0500  Weight: 73.5 kg 68 kg 74.2 kg    Examination:  Physical  Exam: GEN: NAD, alert and oriented x 3, chronically ill in appearance, appears older than stated age HEENT: NCAT, PERRL, EOMI, sclera clear  PULM: CTAB w/o wheezes/crackles, normal respiratory effort, SpO2 94% on room air at rest CV: RRR w/o M/G/R GI: abd soft, NTND, + BS MSK: no peripheral edema NEURO: No focal neurological deficit PSYCH: normal mood/affect Integumentary: No concerning rashes/lesions/wounds noted on exposed skin surfaces    Data Reviewed: I have personally reviewed following labs and imaging studies  CBC: Recent Labs  Lab 12/28/24 0409 12/29/24 0835 12/30/24 0415 12/31/24 0256 01/03/25 0705  WBC 2.8* 3.2* 4.3 3.6* 3.3*  NEUTROABS  --  2.1  --   --   --   HGB 8.8* 9.5* 8.9* 7.9* 8.4*  HCT 27.9* 30.8* 29.2* 25.4* 27.8*  MCV 96.2 97.8 97.7 97.7 98.9  PLT 275 292 296 284 299   Basic Metabolic Panel: Recent Labs  Lab 12/28/24 0409 12/30/24 0415 12/31/24 0256 01/03/25 0705  NA 143 144 145 150*  K 4.1 4.2 4.2 3.5  CL 109 111 110 110  CO2 23 25 28  33*  GLUCOSE 187* 173* 156* 130*  BUN 33* 30* 41* 34*  CREATININE 0.65 0.59* 0.69 0.71  CALCIUM  8.5* 8.6* 8.1* 8.9  MG 2.3 2.4  --  2.4  PHOS  --  3.1  --  3.4   GFR: Estimated Creatinine Clearance: 103.1 mL/min (by C-G formula based on SCr of 0.71 mg/dL). Liver Function Tests: Recent Labs  Lab 12/30/24 0415 01/03/25 0705  AST 25 30  ALT 51* 44  ALKPHOS 192* 225*  BILITOT 0.3 0.3  PROT 6.5 6.8  ALBUMIN  2.8* 3.0*   No results for input(s): LIPASE, AMYLASE in the last 168 hours. No results for input(s): AMMONIA in the last 168 hours. Coagulation Profile: No results for input(s): INR, PROTIME in the last 168 hours. Cardiac Enzymes: No results for input(s): CKTOTAL, CKMB, CKMBINDEX, TROPONINI in the last 168 hours. BNP (last 3 results) Recent Labs    12/11/24 1208  PROBNP 244.0   HbA1C: No results for input(s): HGBA1C in the last 72 hours. CBG: Recent Labs  Lab  12/31/24 2145 01/01/25 2116 01/02/25 1700 01/02/25 2110 01/03/25 1015  GLUCAP 135* 136* 123* 112* 112*   Lipid Profile: Recent Labs    01/03/25 0705  TRIG 159*    Thyroid  Function Tests: No results for input(s): TSH, T4TOTAL, FREET4, T3FREE, THYROIDAB in the last 72 hours. Anemia Panel: No results for input(s): VITAMINB12, FOLATE, FERRITIN, TIBC, IRON , RETICCTPCT in the last 72 hours. Sepsis Labs: No results for input(s): PROCALCITON, LATICACIDVEN in the last 168 hours.  No results found for this or any previous visit (from the past 240 hours).       Radiology Studies: No results found.       Scheduled Meds:  Chlorhexidine  Gluconate Cloth  6 each Topical Daily   feeding supplement ([place] STANDARD 1.4) Liquid  325 mL Oral BID BM   fluconazole   100 mg Oral Daily   insulin  aspart  0-15 Units Subcutaneous Q12H   octreotide   100 mcg Subcutaneous BID   OLANZapine   5 mg Oral Daily   mouth rinse  15 mL Mouth Rinse 4 times per day   pantoprazole   80 mg Oral BID   scopolamine   1 patch Transdermal Q72H   senna-docusate  1 tablet Oral BID   sucralfate   1 g Oral TID   Continuous Infusions:  promethazine  (PHENERGAN ) injection (IM or IVPB) 12.5 mg (12/31/24 2050)   TPN ADULT (ION) 90 mL/hr at 01/03/25 0344   TPN ADULT (ION)       LOS: 36 days  Time spent: 49 minutes spent on 01/03/2025 caring for this patient face-to-face including chart review, ordering labs/tests, documenting, discussion with nursing staff, consultants, updating family and interview/physical exam    Camellia PARAS Jumar Greenstreet, DO [facility] Available via Epic secure chat 7am-7pm After these hours, please refer to coverage provider listed on [email or web address] 01/03/2025, 11:15 AM   "

## 2025-01-04 ENCOUNTER — Inpatient Hospital Stay: Attending: Nurse Practitioner

## 2025-01-04 DIAGNOSIS — K920 Hematemesis: Secondary | ICD-10-CM | POA: Diagnosis not present

## 2025-01-04 LAB — BASIC METABOLIC PANEL WITH GFR
Anion gap: 7 (ref 5–15)
BUN: 37 mg/dL — ABNORMAL HIGH (ref 6–20)
CO2: 31 mmol/L (ref 22–32)
Calcium: 8.8 mg/dL — ABNORMAL LOW (ref 8.9–10.3)
Chloride: 110 mmol/L (ref 98–111)
Creatinine, Ser: 0.79 mg/dL (ref 0.61–1.24)
GFR, Estimated: >60 mL/min
Glucose, Bld: 151 mg/dL — ABNORMAL HIGH (ref 70–99)
Potassium: 4.2 mmol/L (ref 3.5–5.1)
Sodium: 149 mmol/L — ABNORMAL HIGH (ref 135–145)

## 2025-01-04 LAB — GLUCOSE, CAPILLARY: Glucose-Capillary: 130 mg/dL — ABNORMAL HIGH (ref 70–99)

## 2025-01-04 MED ORDER — OLANZAPINE 5 MG PO TABS
5.0000 mg | ORAL_TABLET | Freq: Every day | ORAL | Status: DC
  Administered 2025-01-05: 5 mg via ORAL
  Filled 2025-01-04: qty 1

## 2025-01-04 MED ORDER — TRAVASOL 10 % IV SOLN
INTRAVENOUS | Status: DC
  Filled 2025-01-04: qty 1188

## 2025-01-04 MED ORDER — LORAZEPAM 0.5 MG PO TABS
0.5000 mg | ORAL_TABLET | Freq: Once | ORAL | Status: DC | PRN
  Filled 2025-01-04: qty 1

## 2025-01-04 NOTE — Discharge Summary (Signed)
 " Physician Discharge Summary  Henry Jorge FMW:[id number] DOB: 10/29/1966 DOA: 11/27/2024  PCP: [name], DO  Admit date: 11/27/2024 Discharge date: 01/05/2025  Admitted From: Home Disposition: Home  Recommendations for Outpatient Follow-up:  Follow up with PCP in 1-2 weeks Follow-up with Dr. Cloretta as scheduled Oncology arranging follow-up at [facility] for further treatment Continue TPN. Continue goals of care discussion outpatient, overall prognosis remains poor/grim  Home Health: RN Equipment/Devices: Oxygen , 4 L per nasal cannula during ambulation  Discharge Condition: Overall prognosis remains poor/grim given advanced malignancy CODE STATUS: DNR Diet recommendation: Full liquid diet as tolerates, TPN  History of present illness:  Henry May is a 59 y.o. male with past medical history significant for metastatic gastric cancer currently on chemotherapy/radiation (oncology, Dr. Cloretta), intractable nausea/vomiting due to progressive gastric cancer/omental disease, gastric antral stenosis s/p stent, intermittent hematemesis secondary to gastric mass, DVT s/p IVC filter who presented to [facility] on 12/13 with nausea/vomiting, coffee-ground emesis, weakness.  Patient underwent recent EGD 12/4 with LA grade D esophagitis without bleeding, large infiltrative sessile and ulcerated circumferential mass with contact of bleeding in the gastric body/antrum with antral narrowing and luminal diameter approxione 0.5 cm.   In the ED, temperature 98.5 May, HR 95, RR 23, BP 133/92, SpO2 93% on room air.  WBC 7.1, hemoglobin 14.5, platelet count 274.  Sodium 135, potassium 4.0, chloride 97, CO2 25, glucose 122, BUN 929, creatinine 0.83.  Lipase 171.  AST 25, ALT 62, total bilirubin 0.6.  High sensitive troponin less than 15.  INR 1.1.  CT abdomen/pelvis with contrast with dilated proximal small bowel suggesting small bowel obstruction with a discrete transition  point not seen, thick walled gastric body/antrum with history of gastric cancer without definite focal mass, mild nodularity along the pylorus, scarring with superimposed patchy peribronchial vascular nodularity bilateral lower lobes right greater than left suggestive of mild aspiration/pneumonia.  Gastroenterology, general surgery, oncology consulted.  TRH consulted for admission and patient was transferred to [facility] for further evaluation management.   Patient underwent EGD with gastric stent placement on 12/16, unable to tolerate diet and started on TPN.  Plan to discharge on TPN but nausea and vomiting progress following stopping of octreotide  and Decadron  with hemoptysis, restarted on octreotide  infusion.  CT chest/abdomen/pelvis with new patchy substernal pneumomediastinum of indeterminate source, patchy consolidation in lower lobes, posterior LUL, esophageal distention with retained/reflux fluid in thoracic inlet, emphysematous cystitis and circumferential gastric thickening with adjacent stranding change, antropyloric stenting and continued duodenal dilation, thickening of jejunal folds.  Ongoing bleeding felt secondary to metastatic gastric carcinoma.  Patient was continued on chemotherapy and started on XRT to decrease tumor burden and provide symptomatic relief.  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  CT abdomen/pelvis 12/13 with thick walled gastric body and antrum without a definitive mass with nodularity along the pylorus and proximal dilated small bowel loops, questionable mild obstruction without discrete transition point.  Underwent EGD with gastric stent placement on 12/16.  Seen by  general surgery, no indication for surgical intervention at this time and signed off 12/19.  Developed hematemesis 12/25 with or without hemoptysis and octreotide  was restarted.  Repeat CT abdomen/pelvis 12/28 with circumferential gastric thickening and antropyloric stent with continued dilation of the duodenum 6 mL centimeters  caliber without any appreciable transition, thickened jejunal folds consistent with nonspecific enteritis and small-volume ascites.  Seen by GI, limited role of endoscopy with possible malignant bleeding and potential retroperitoneal air tracking, signed off 1/2.  Oral/rectal Phenergan , lorazepam , Decadron , Compazine , Zofran  have not helped his nausea and vomiting. Medical oncology, radiation oncology were consulted and followed during the hospital course.  Completed 10 fractions of radiation on 12/30/2024.  Received cycle 5 of weekly cisplatin  on 12/29/2024.  Was initially titrated off octreotide  but with return of severe nausea and vomiting and was restarted.  Plan to change to Sandostatin  with TPN on discharge per oncology.  Will continue Protonix  80 mg p.o. twice daily, Carafate  1 g p.o. 3 times daily.  Started on scopolamine  patch and Zyprexa  with improved nausea.  Continue Zofran  as needed.  Outpatient follow-up with medical oncology, palliative care.  Exertional hypoxia Patient with notable hypoxia while ambulated.  Discharging with home O2.   Oral buccal candidiasis Completed course of Diflucan    Aspiration pneumonitis/pneumonia Pneumomediastinum noted on CT chest.  Completed 7-day course of Unasyn , completed on 12/17/2024.   Leukopenia Stable, continue to monitor closely on chemotherapy   Anemia Iron  deficiency Transfused total 8 unit PBC starting hospitalization, last on 12/23/2024.  Hemoglobin 8.4 at time of discharge.   Type 2 diabetes mellitus Diet controlled, hemoglobin A1c 5.7%.   History of DVT S/p IVC filter   Elevated lipase Lipase 171>53   Severe  malnutrition Body mass index is 22.19 kg/m. Nutrition Status: Nutrition Problem: Severe Malnutrition Etiology: chronic illness (gastric cancer) Signs/Symptoms: severe fat depletion, severe muscle depletion, percent weight loss (24% weight loss in less than 3 months) Percent weight loss: 24 % (in < 3 months) Interventions: Refer to RD note for recommendations -- Continue TPN, full liquid diet as tolerates  Discharge Diagnoses:  Principal Problem:   Hematemesis with nausea Active Problems:   Intractable nausea and vomiting   SBO (small bowel obstruction) (HCC)   Diabetes mellitus without complication (HCC)   Gastric cancer (HCC)   History of DVT (deep vein thrombosis)   Protein-calorie malnutrition, severe   Gastric outlet obstruction   Decreased hemoglobin   Subcutaneous emphysema   Fever, unspecified   Aspiration pneumonia due to gastric secretions (HCC)   Coffee ground emesis    Discharge Instructions  Discharge Instructions     Amb Referral to Palliative Care   Complete by: As directed    Call MD for:  difficulty breathing, headache or visual disturbances   Complete by: As directed    Call MD for:  extreme fatigue   Complete by: As directed    Call MD for:  persistant dizziness or light-headedness   Complete by: As directed    Call MD for:  redness, tenderness, or signs of infection (pain, swelling, redness, odor or green/yellow discharge around incision site)   Complete by: As directed    Call MD for:  temperature >100.4   Complete by: As directed    Diet full liquid   Complete by: As directed    Increase activity slowly   Complete by: As directed       Allergies as of 01/05/2025       Reactions   Fluorouracil  Other (See Comments)   5-FU induced psychosis. See progress note from 02/11/24        Medication List     STOP taking these medications    dexamethasone  4 MG tablet Commonly known as: DECADRON    famotidine  20 MG tablet Commonly  known as:  PEPCID    LORazepam  0.5 MG tablet Commonly known as: ATIVAN  Replaced by: LORazepam  2 MG/ML concentrated solution   metoCLOPramide  10 MG tablet Commonly known as: REGLAN    omeprazole  40 MG capsule Commonly known as: PRILOSEC   ondansetron  8 MG tablet Commonly known as: ZOFRAN    prochlorperazine  25 MG suppository Commonly known as: COMPAZINE        TAKE these medications    aluminum-magnesium  hydroxide 200-200 MG/5ML suspension Take 10 mLs by mouth every 6 (six) hours as needed for indigestion.   guaiFENesin -dextromethorphan  100-10 MG/5ML syrup Commonly known as: ROBITUSSIN DM Take 5 mLs by mouth every 4 (four) hours as needed for cough (chest congestion).   HYDROmorphone  2 MG tablet Commonly known as: Dilaudid  Take 1 tablet (2 mg total) by mouth every 6 (six) hours as needed for severe pain (pain score 7-10).   lidocaine -prilocaine  cream Commonly known as: EMLA  Apply 1 Application topically as directed. Apply 1/2 tablespoon to port site 1-2 hours prior to stick and cover with Press-and-Seal to numb port site. Do not start until 14 days of port placement.   LORazepam  2 MG/ML concentrated solution Commonly known as: ATIVAN  Take 0.5 mLs (1 mg total) by mouth every 6 (six) hours as needed for anxiety (nausea). Replaces: LORazepam  0.5 MG tablet   OLANZapine  5 MG tablet Commonly known as: ZYPREXA  Take 1 tablet (5 mg total) by mouth daily.   ondansetron  8 MG disintegrating tablet Commonly known as: ZOFRAN -ODT Take 1 tablet (8 mg total) by mouth every 8 (eight) hours as needed for refractory nausea / vomiting.   pantoprazole  sodium 40 mg Commonly known as: PROTONIX  Take 80 mg by mouth 2 (two) times daily.   scopolamine  1 MG/3DAYS Commonly known as: TRANSDERM-SCOP Place 1 patch (1 mg total) onto the skin every 3 (three) days. Start taking on: January 06, 2025 What changed:  when to take this reasons to take this additional instructions   senna-docusate 8.6-50 MG  tablet Commonly known as: Senokot-S Take 1 tablet by mouth 2 (two) times daily.   sucralfate  1 GM/10ML suspension Commonly known as: CARAFATE  Take 10 mLs (1 g total) by mouth 4 (four) times daily.               Durable Medical Equipment  (From admission, onward)           Start     Ordered   01/05/25 0728  For home use only DME oxygen   Once       Question Answer Comment  Length of Need Lifetime   Mode or (Route) Nasal cannula   Liters per Minute 4   Oxygen  conserving device Yes   Oxygen  delivery system: Gas   Oxygen  delivery system: Concentrator   Oxygen  delivery system: Portable concentrator (POC)      01/05/25 0727            Follow-up Information     [facility] (DME[facility] Follow up.   Specialty: DME Services Why: Alfreda will provide home TPN. Contact information: [address] [place]  [address] [phone]        [name], DO. Schedule an appointment as soon as possible for a visit in 1 week(s).   Specialty: Family Medicine Contact information: [address] [place] [address] [phone]         Cloretta Arley NOVAK, MD. Schedule an appointment as soon as possible for a visit.   Specialty: Oncology Contact information: [phone]  [place] [address] [phone]                Allergies[1]  Consultations: Medical oncology, Dr. Cloretta Radiation oncology [facility] [place] gastro[facility]ology  Palliative care   Procedures/Studies: DG CHEST PORT 1 VIEW Result Date: 12/31/2024 EXAM: 1 VIEW(S) XRAY OF THE CHEST 12/31/2024 05:29:00 AM COMPARISON: 12/11/2024 CLINICAL HISTORY: SOB (shortness of breath) FINDINGS: LINES, TUBES AND DEVICES: Right chest port with tip at superior cavoatrial junction. IVC filter noted. LUNGS AND PLEURA: Persistent reticulonodular opacities at right lung base and right upper lobe. Improved aeration of left lung  base. No pleural effusion. No pneumothorax. HEART AND MEDIASTINUM: No acute abnormality of the cardiac and mediastinal silhouettes. BONES AND SOFT TISSUES: No acute osseous abnormality. IMPRESSION: 1. Persistent reticulonodular opacities at the right lung base and right upper lobe. 2. Improved aeration of the left lung base. Electronically signed by: Waddell Calk MD 12/31/2024 06:15 AM EST RP Workstation: [id number]   DG Abd 2 Views Result Date: 12/23/2024 CLINICAL DATA:  Nausea and vomiting. EXAM: ABDOMEN - 2 VIEW COMPARISON:  12/10/2024, CT 12/10/2024 FINDINGS: Bowel gas pattern is nonobstructive. Gastric stent over the mid to right upper abdomen unchanged. IVC filter unchanged. Minimal fecal retention over the right colon/hepatic flexure. No free peritoneal air. Remainder of the exam is unchanged. IMPRESSION: Nonobstructive bowel gas pattern. Electronically Signed   By: Toribio Agreste M.D.   On: 12/23/2024 08:56   CT CHEST ABDOMEN PELVIS W CONTRAST Result Date: 12/13/2024 EXAM: CT CHEST, ABDOMEN AND PELVIS WITH CONTRAST 12/12/2024 02:52:35 PM TECHNIQUE: CT of the chest, abdomen and pelvis was performed with the administration of 100 mL of iohexol  (OMNIPAQUE ) 300 MG/ML solution. Multiplanar reformatted images are provided for review. Automated exposure control, iterative reconstruction, and/or weight based adjustment of the mA/kV was utilized to reduce the radiation dose to as low as reasonably achievable. COMPARISON: Portable chest from 12/11/2024, portable abdomen 12/10/2024, portable chest 12/09/2024, most recent abdomen and pelvis CT with contrast 11/27/2024, 11/02/2024, and 09/01/2024; and PET CT 01/07/2024. CLINICAL HISTORY: Gastric cancer, monitor; Patient with fever, subcutaneous emphysema and carcinomatosis, rule out retroperitoneal perforation. History of EGD 11/30/2024 with insertion of a gastroduodenal stent. FINDINGS: CHEST: MEDIASTINUM AND LYMPH NODES: Right IJ port catheter terminating at the  superior cavoatrial junction. Heart and pericardium are unremarkable. The central airways are clear. There are a few slightly prominent bilateral hilar lymph nodes up to 1 cm in short axis, probably reactive, new from the PET CT. There is new enlargement of a right hilar lymph node just inferior to the right main bronchus measuring 1.2 x 1.8 cm. No mediastinal adenopathy is seen. Heterogeneous 1.6 cm nodule of the posterior right lobe of the thyroid  was evaluated with thyroid  ultrasound 01/13/2024. This is unchanged in size by CT, but could not be located on ultrasound. Consider repeat ultrasound if clinically warranted. Once again, the esophagus is distended with refluxed or retained fluid nearly to the thoracic inlet. There is retained mucoid debris in the trachea to the right. The main bronchi are patent. There is a significant aspiration risk due to the esophageal fluid distention. Aspiration precautions are recommended unless already being done. LUNGS AND PLEURA: There is scarring in the lungs without a specific zonal predilection, most likely post-COVID type scarring, with patchy areas of predominantly subpleural coarse interstitial change and ground glass and areas of architectural distortion. There is patchy consolidation in the lower lobes, and scattered opacities in the posterior left upper lobe consistent with pneumonia or aspiration.  This finding has increased especially in the left lower lobe since 11/27/2024. No other focal infiltrate is seen. No pleural effusion or pneumothorax. ABDOMEN AND PELVIS: LIVER: The liver is mildly steatotic without evidence of metastasis. GALLBLADDER AND BILE DUCTS: Gallbladder is unremarkable. No biliary ductal dilatation. SPLEEN: No acute abnormality. PANCREAS: No acute abnormality. ADRENAL GLANDS: No adrenal mass. KIDNEYS, URETERS AND BLADDER: There are stones measuring 6 mm and 4 mm in the right kidney collecting system. No left nephrolithiasis or hydronephrosis. No  perinephric or periureteral stranding. There is air in the anterior bladder, which could be due to catheterization, but there is air in the bladder base in the wall consistent with emphysematous cystitis. This was not seen previously. Urinalysis and appropriate treatment is recommended. GI AND BOWEL: Circumferential gastric thickening continues to be seen, with adjacent stranding change. Antropyloric stenting has been performed. There is continued dilatation in the duodenum, maximum 6 cm caliber without an appreciable transition. There are thickened jejunal folds consistent with nonspecific enteritis. The small bowel is otherwise normal caliber. The appendix is normal. No evidence of focal colitis or diverticulitis. There is a small volume of ascites. REPRODUCTIVE ORGANS: No acute abnormality. PERITONEUM AND RETROPERITONEUM: No free air. VASCULATURE: Aorta is normal in caliber. There is an IVC filter. No other significant vascular findings. ABDOMINAL AND PELVIS LYMPH NODES: No lymphadenopathy. BONES AND SOFT TISSUES: There is multilevel bridging enthesopathy of the thoracic spine. No thoracic bone metastasis is seen. There are mild degenerative changes of the lumbar spine. Since 11/27/2024, there is patchy substernal pneumomediastinum, source indeterminate. This extends cephalad to the supraclavicular fossae and perivascular regions of the neck. IMPRESSION: 1. New patchy substernal pneumomediastinum of indeterminate source, extending into the neck. 2. Patchy consolidation in the lower lobes and scattered opacities in the posterior left upper lobe consistent with pneumonia or aspiration, increased since 11/27/2024. 3. Esophageal distention with retained/refluxed fluid to the thoracic inlet, with significant aspiration risk; aspiration precautions are recommended. 4. 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 5. Circumferential gastric thickening with adjacent  stranding change and antropyloric stenting, with continued duodenal dilatation (maximum 6 cm) without transition and thickened jejunal folds consistent with nonspecific enteritis. 6. New mildly enlarged right hilar lymph node and a few slightly prominent bilateral hilar lymph nodes; no evidence of metastatic disease. 7. Right-sided nephrolithiasis. 8. Small volume of ascites, new. 9. These results will be telephoned to the referring provider or the referring providers representative by professional radiology assistant [facility]) personnel, with communication documented in the [facility] dashboard. Electronically signed by: Francis Quam MD 12/13/2024 02:46 AM EST RP Workstation: [id number]   DG Chest Port 1 View Result Date: 12/11/2024 CLINICAL DATA:  Hypoxemia. EXAM: PORTABLE CHEST 1 VIEW COMPARISON:  December 09, 2024 FINDINGS: There is stable right-sided venous Port-A-Cath positioning. The heart size and mediastinal contours are within normal limits. Mild infiltrate is seen within the left lung base which represents a new finding when compared to the prior study. Mild, stable areas of airspace disease are seen within the right infrahilar region and lateral aspect of the right lung base. No pleural effusion or pneumothorax is identified. A mild amount of supraclavicular soft tissue air is noted on the left. This is stable in appearance when compared to the prior study. The visualized skeletal structures are unremarkable. IMPRESSION: 1. Mild left basilar infiltrate. 2. Stable right infrahilar and right basilar airspace disease. 3. Stable left supraclavicular soft tissue air. Electronically Signed   By: Suzen Dwane HERO.D.  On: 12/11/2024 11:34   DG Abd Portable 1V Result Date: 12/11/2024 CLINICAL DATA:  Hematemesis. EXAM: PORTABLE ABDOMEN - 1 VIEW COMPARISON:  12/02/2024 FINDINGS: No gaseous small bowel dilatation to suggest obstruction. Colon is nondilated. Contrast material is seen scattered along the length  of the colon from the cecum to the distal descending segment. Gastric stent device is overlies the T12 vertebral body compatible with placement in the region of the gastric antrum and stable since chest x-ray of 12/09/2024. IMPRESSION: 1. Nonobstructive bowel gas pattern. 2. Gastric stent device overlies the T12 vertebral body consistent with placement in the gastric antrum and stable since chest x-ray of 12/09/2024. Electronically Signed   By: Camellia Candle M.D.   On: 12/11/2024 07:10   DG Chest Port 1 View Result Date: 12/09/2024 EXAM: 1 VIEW(S) XRAY OF THE CHEST 12/09/2024 09:35:00 AM COMPARISON: 11/27/2024 cxr , ct abd/pelvis 11/27/24 CLINICAL HISTORY: Shortness of breath FINDINGS: LINES, TUBES AND DEVICES: Accessed right Port-A-Cath in place with tip just distal to the cavoatrial junction. Right upper quadrant stent noted. IVC filter noted. LUNGS AND PLEURA: Persistent right middle and lower lobe opacities. No pleural effusion. No pneumothorax. HEART AND MEDIASTINUM: No acute abnormality of the cardiac and mediastinal silhouettes. BONES AND SOFT TISSUES: Subcutaneous emphysema over the lower neck. No acute osseous abnormality. IMPRESSION: 1. Persistent right middle and lower lobe opacities. 2. Subcutaneous emphysema over the lower neck/shoulder. Electronically signed by: Morgane Naveau MD 12/09/2024 01:46 PM EST RP Workstation: [id number]     Subjective: Patient seen examined bedside, lying in bed.  Spouse and daughter present.  No complaints.  Discharging home as TPN now has been set up.  Discussed with medical oncology, Dr. Cloretta this morning; will follow-up in the oncology clinic and attempting referral to [facility] health for further treatment.  Patient with no other complaints, concerns or questions at this time.  Appreciative the care he received that was a long.  Denies headache, no chest pain, no shortness of breath, no abdominal pain, no fever/chills, no diarrhea, no abdominal  pain.  No acute events overnight per nurse staff.  Discharge Exam: Vitals:   01/04/25 1957 01/05/25 0534  BP: 111/74 109/75  Pulse: 90 97  Resp: 18 18  Temp: 98 May (36.7 C) 98 May (36.7 C)  SpO2: 97% 98%   Vitals:   01/04/25 1318 01/04/25 1957 01/05/25 0500 01/05/25 0534  BP: 99/68 111/74  109/75  Pulse: 96 90  97  Resp: 20 18  18   Temp: 98.3 May (36.8 C) 98 May (36.7 C)  98 May (36.7 C)  TempSrc: Oral Oral    SpO2: 99% 97%  98%  Weight:   74.7 kg   Height:        Physical Exam: GEN: NAD, alert and oriented x 3, chronically ill in appearance, appears older than stated age HEENT: NCAT, PERRL, EOMI, sclera clear PULM: CTAB w/o wheezes/crackles, normal respiratory effort, SpO2 98% on room air at rest CV: RRR w/o M/G/R GI: abd soft, NTND, + BS MSK: no peripheral edema NEURO: No focal neurological deficit PSYCH: normal mood/affect Integumentary: No concerning rashes/lesions/wounds noted on exposed skin surfaces    The results of significant diagnostics from this hospitalization (including imaging, microbiology, ancillary and laboratory) are listed below for reference.     Microbiology: No results found for this or any previous visit (from the past 240 hours).   Labs: BNP (last 3 results) Recent Labs    02/05/24 1514  BNP 420.4*  Basic Metabolic Panel: Recent Labs  Lab 12/30/24 0415 12/31/24 0256 01/03/25 0705 01/04/25 0355 01/05/25 0407  NA 144 145 150* 149* 149*  K 4.2 4.2 3.5 4.2 4.0  CL 111 110 110 110 111  CO2 25 28 33* 31 31  GLUCOSE 173* 156* 130* 151* 130*  BUN 30* 41* 34* 37* 40*  CREATININE 0.59* 0.69 0.71 0.79 0.84  CALCIUM  8.6* 8.1* 8.9 8.8* 9.0  MG 2.4  --  2.4  --   --   PHOS 3.1  --  3.4  --   --    Liver Function Tests: Recent Labs  Lab 12/30/24 0415 01/03/25 0705  AST 25 30  ALT 51* 44  ALKPHOS 192* 225*  BILITOT 0.3 0.3  PROT 6.5 6.8  ALBUMIN  2.8* 3.0*   No results for input(s): LIPASE, AMYLASE in the last 168  hours. No results for input(s): AMMONIA in the last 168 hours. CBC: Recent Labs  Lab 12/30/24 0415 12/31/24 0256 01/03/25 0705  WBC 4.3 3.6* 3.3*  HGB 8.9* 7.9* 8.4*  HCT 29.2* 25.4* 27.8*  MCV 97.7 97.7 98.9  PLT 296 284 299   Cardiac Enzymes: No results for input(s): CKTOTAL, CKMB, CKMBINDEX, TROPONINI in the last 168 hours. BNP: Invalid input(s): POCBNP CBG: Recent Labs  Lab 01/02/25 1700 01/02/25 2110 01/03/25 1015 01/03/25 2056 01/04/25 1112  GLUCAP 123* 112* 112* 138* 130*   D-Dimer No results for input(s): DDIMER in the last 72 hours. Hgb A1c No results for input(s): HGBA1C in the last 72 hours. Lipid Profile Recent Labs    01/03/25 0705  TRIG 159*   Thyroid  function studies No results for input(s): TSH, T4TOTAL, T3FREE, THYROIDAB in the last 72 hours.  Invalid input(s): FREET3 Anemia work up No results for input(s): VITAMINB12, FOLATE, FERRITIN, TIBC, IRON , RETICCTPCT in the last 72 hours. Urinalysis    Component Value Date/Time   COLORURINE YELLOW 11/04/2024 1812   APPEARANCEUR CLEAR 11/04/2024 1812   LABSPEC 1.025 11/04/2024 1812   PHURINE 5.5 11/04/2024 1812   GLUCOSEU NEGATIVE 11/04/2024 1812   HGBUR NEGATIVE 11/04/2024 1812   BILIRUBINUR NEGATIVE 11/04/2024 1812   KETONESUR 15 (A) 11/04/2024 1812   PROTEINUR TRACE (A) 11/04/2024 1812   NITRITE NEGATIVE 11/04/2024 1812   LEUKOCYTESUR NEGATIVE 11/04/2024 1812   Sepsis Labs Recent Labs  Lab 12/30/24 0415 12/31/24 0256 01/03/25 0705  WBC 4.3 3.6* 3.3*   Microbiology No results found for this or any previous visit (from the past 240 hours).   Time coordinating discharge: Over 30 minutes  SIGNED:   Camellia PARAS Kelvin Sennett, DO  [facility] 01/05/2025, 9:58 AM     [1]  Allergies Allergen Reactions   Fluorouracil  Other (See Comments)    5-FU induced psychosis. See progress note from 02/11/24   "

## 2025-01-04 NOTE — Progress Notes (Signed)
 SATURATION QUALIFICATIONS: (This note is used to comply with regulatory documentation for home oxygen )  Patient Saturations on Room Air at Rest = 96%  Patient Saturations on Room Air while Ambulating = 82%  Patient Saturations on 2 Liters of oxygen  while Ambulating = 88%  Patient Saturations on 4 Liters of oxygen  while Ambulating = 98%  Please briefly explain why patient needs home oxygen : Patient required 4 liters of oxygen  to reach sat above 90% when ambulating  Lucrecia Mcphearson, Lonell Louder, RN

## 2025-01-04 NOTE — Progress Notes (Signed)
 " PROGRESS NOTE    Henry May  FMW:[id number] DOB: 07/27/1966 DOA: 11/27/2024 PCP: [name], DO    Brief Narrative:   Henry May is a 59 y.o. male with past medical history significant for metastatic gastric cancer currently on chemotherapy/radiation (oncology, Dr. Cloretta), intractable nausea/vomiting due to progressive gastric cancer/omental disease, gastric antral stenosis s/p stent, intermittent hematemesis secondary to gastric mass, DVT s/p IVC filter who presented to [facility] on 12/13 with nausea/vomiting, coffee-ground emesis, weakness.  Patient underwent recent EGD 12/4 with LA grade D esophagitis without bleeding, large infiltrative sessile and ulcerated circumferential mass with contact of bleeding in the gastric body/antrum with antral narrowing and luminal diameter approxione 0.5 cm.  In the ED, temperature 98.5 F, HR 95, RR 23, BP 133/92, SpO2 93% on room air.  WBC 7.1, hemoglobin 14.5, platelet count 274.  Sodium 135, potassium 4.0, chloride 97, CO2 25, glucose 122, BUN 929, creatinine 0.83.  Lipase 171.  AST 25, ALT 62, total bilirubin 0.6.  High sensitive troponin less than 15.  INR 1.1.  CT abdomen/pelvis with contrast with dilated proximal small bowel suggesting small bowel obstruction with a discrete transition point not seen, thick walled gastric body/antrum with history of gastric cancer without definite focal mass, mild nodularity along the pylorus, scarring with superimposed patchy peribronchial vascular nodularity bilateral lower lobes right greater than left suggestive of mild aspiration/pneumonia.  Gastroenterology, general surgery, oncology consulted.  TRH consulted for admission and patient was transferred to [facility] for further evaluation management.  Patient underwent EGD with gastric stent placement on 12/16, unable to tolerate diet and started on TPN.  Plan to discharge on TPN but nausea and vomiting progress following stopping  of octreotide  and Decadron  with hemoptysis, restarted on octreotide  infusion.  CT chest/abdomen/pelvis with new patchy substernal pneumomediastinum of indeterminate source, patchy consolidation in lower lobes, posterior LUL, esophageal distention with retained/reflux fluid in thoracic inlet, emphysematous cystitis and circumferential gastric thickening with adjacent stranding change, antropyloric stenting and continued duodenal dilation, thickening of jejunal folds.  Ongoing bleeding felt secondary to metastatic gastric carcinoma.  Patient was continued on chemotherapy and started on XRT to decrease tumor burden and provide symptomatic relief.  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  CT abdomen/pelvis 12/13 with thick walled gastric body and antrum without a definitive mass with nodularity along the pylorus and proximal dilated small bowel loops, questionable mild obstruction without discrete transition point.  Underwent EGD with gastric stent placement on 12/16.  Seen by general surgery, no indication for surgical intervention at this time and signed off 12/19.  Developed hematemesis 12/25 with or without hemoptysis and octreotide  was restarted.  Repeat CT abdomen/pelvis 12/28 with circumferential gastric thickening and antropyloric stent with continued dilation of the duodenum 6 mL centimeters caliber without any appreciable transition, thickened jejunal folds consistent with nonspecific enteritis and small-volume ascites.  Seen by GI, limited role of endoscopy with possible malignant bleeding and potential retroperitoneal air tracking, signed off 1/2.  Oral/rectal Phenergan , lorazepam , Decadron , Compazine , Zofran   have not helped his nausea and vomiting. -- Medical oncology, radiation oncology following, appreciate assistance -- Completed 10 fractions of radiation 12/30/2024. -- Received cycle 5 of weekly cisplatin  12/29/2024 -- Dilaudid  0.5 mg IV every 4 hours as needed severe pain -- Octreotide   100 mcg Sturgeon BID > plan to change to Sandostatin  with TPN on discharge per oncology -- Protonix  80 mg IV q12h -- Carafate  1 g p.o. 3 times daily -- Phenergan  12.5 mg IV PRN nausea/vomiting -- Scopolamine  patch -- Zyprexa  5 mg p.o. daily -- Continue TPN, pharmacy consult for dosing/monitoring -- TOC following for discharge planning -- Palliative care continues to follow intermittently, overall prognosis remains poor/grim; would recommend transitioning to hospice care  Oral buccal candidiasis Started on Diflucan  by oncology 1/17  Aspiration pneumonitis/pneumonia Pneumomediastinum noted on CT chest.  Completed 7-day course of Unasyn , completed on 12/17/2024.  Leukopenia Stable, continue to monitor closely on chemotherapy  Anemia Iron  deficiency -- Hgb 14.5>>6.7>8.8>>6.3>>9.7>>7.3>>9.1>>8.8>9.5>8.9>8.4 -- Transfused 8 unit PRBCs during his hospitalization, last 12/23/2024 -- Continue monitor hemoglobin intermittently, transfuse for hemoglobin less than 7.0  Type 2 diabetes mellitus Diet controlled, hemoglobin A1c 5.7%.  History of DVT S/p IVC filter  Elevated lipase -- Lipase 171>53  Severe malnutrition Body mass index is 22.19 kg/m. Nutrition Status: Nutrition Problem: Severe Malnutrition Etiology: chronic illness (gastric cancer) Signs/Symptoms: severe fat depletion, severe muscle depletion, percent weight loss (24% weight loss in less than 3 months) Percent weight loss: 24 % (in < 3 months) Interventions: Refer to RD note for recommendations -- Continue TPN, full liquid diet as tolerates   DVT prophylaxis: Place and maintain sequential compression device Start: 12/15/24 1907 SCDs Start:  11/28/24 1042    Code Status: Limited: Do not attempt resuscitation (DNR) -DNR-LIMITED -Do Not Intubate/DNI  Family Communication:   Disposition Plan:  Level of care: Progressive Status is: Inpatient Remains inpatient appropriate because: Nausea improved, TOC working on home TPN, possible discharge home tomorrow, overall prognosis poor/grim    Consultants:  Medical oncology, Dr. Cloretta Radiation oncology General Surgery - signed off 12/19 [place] [facility] -signed off 1/2 Palliative care  Procedures:  EGD  Antimicrobials:  Unasyn  12/27 - 1/2   Subjective: Patient seen examined bedside, lying in bed.  Spouse present at bedside.  Nausea improved, continues with intermittent vomiting.  Reports that scopolamine  patch and Zyprexa  helping.  Spouse inquiring regarding insurance coverage for TPN, will have POC and Henry May reach out.  Per TOC, should be able to discharge home tomorrow.  Patient's without any other specific questions or concerns at this time.  Denies headache, no visual changes, no chest pain, no palpitations, no shortness of breath, no abdominal pain, no fever/chills/night sweats, no diarrhea.   Overall prognosis poor given advanced malignancy.  Objective: Vitals:   01/03/25 0500 01/03/25 0527 01/03/25 1956 01/04/25 0542  BP:  115/77 118/76 104/72  Pulse:  83 99 95  Resp:  16 14 16   Temp:  98.6 F (37 C) 97.7 F (36.5 C) 97.6 F (36.4 C)  TempSrc:  Oral Oral Oral  SpO2:  97% (!) 89% 96%  Weight: 74.2 kg     Height:        Intake/Output Summary (Last 24 hours) at 01/04/2025 1231 Last data filed at 01/04/2025 1219 Gross per 24 hour  Intake 2972.95 ml  Output 200 ml  Net 2772.95 ml   Filed Weights   01/01/25 0500 01/02/25 0500 01/03/25 0500  Weight: 73.5 kg 68 kg 74.2 kg    Examination:  Physical Exam: GEN: NAD, alert and oriented x 3, chronically ill in appearance, appears older than stated age HEENT: NCAT, PERRL, EOMI, sclera  clear PULM: CTAB w/o wheezes/crackles, normal respiratory effort, SpO2 96% on room air at rest CV: RRR w/o M/G/R GI: abd  soft, NTND, + BS MSK: no peripheral edema NEURO: No focal neurological deficit PSYCH: normal mood/affect Integumentary: No concerning rashes/lesions/wounds noted on exposed skin surfaces    Data Reviewed: I have personally reviewed following labs and imaging studies  CBC: Recent Labs  Lab 12/29/24 0835 12/30/24 0415 12/31/24 0256 01/03/25 0705  WBC 3.2* 4.3 3.6* 3.3*  NEUTROABS 2.1  --   --   --   HGB 9.5* 8.9* 7.9* 8.4*  HCT 30.8* 29.2* 25.4* 27.8*  MCV 97.8 97.7 97.7 98.9  PLT 292 296 284 299   Basic Metabolic Panel: Recent Labs  Lab 12/30/24 0415 12/31/24 0256 01/03/25 0705 01/04/25 0355  NA 144 145 150* 149*  K 4.2 4.2 3.5 4.2  CL 111 110 110 110  CO2 25 28 33* 31  GLUCOSE 173* 156* 130* 151*  BUN 30* 41* 34* 37*  CREATININE 0.59* 0.69 0.71 0.79  CALCIUM  8.6* 8.1* 8.9 8.8*  MG 2.4  --  2.4  --   PHOS 3.1  --  3.4  --    GFR: Estimated Creatinine Clearance: 103.1 mL/min (by C-G formula based on SCr of 0.79 mg/dL). Liver Function Tests: Recent Labs  Lab 12/30/24 0415 01/03/25 0705  AST 25 30  ALT 51* 44  ALKPHOS 192* 225*  BILITOT 0.3 0.3  PROT 6.5 6.8  ALBUMIN  2.8* 3.0*   No results for input(s): LIPASE, AMYLASE in the last 168 hours. No results for input(s): AMMONIA in the last 168 hours. Coagulation Profile: No results for input(s): INR, PROTIME in the last 168 hours. Cardiac Enzymes: No results for input(s): CKTOTAL, CKMB, CKMBINDEX, TROPONINI in the last 168 hours. BNP (last 3 results) Recent Labs    12/11/24 1208  PROBNP 244.0   HbA1C: No results for input(s): HGBA1C in the last 72 hours. CBG: Recent Labs  Lab 01/02/25 1700 01/02/25 2110 01/03/25 1015 01/03/25 2056 01/04/25 1112  GLUCAP 123* 112* 112* 138* 130*   Lipid Profile: Recent Labs    01/03/25 0705  TRIG 159*    Thyroid   Function Tests: No results for input(s): TSH, T4TOTAL, FREET4, T3FREE, THYROIDAB in the last 72 hours. Anemia Panel: No results for input(s): VITAMINB12, FOLATE, FERRITIN, TIBC, IRON , RETICCTPCT in the last 72 hours. Sepsis Labs: No results for input(s): PROCALCITON, LATICACIDVEN in the last 168 hours.  No results found for this or any previous visit (from the past 240 hours).       Radiology Studies: No results found.       Scheduled Meds:  Chlorhexidine  Gluconate Cloth  6 each Topical Daily   feeding supplement (KATE FARMS STANDARD 1.4) Liquid  325 mL Oral BID BM   fluconazole   100 mg Oral Daily   melatonin  3 mg Oral QHS   octreotide   100 mcg Subcutaneous BID   mouth rinse  15 mL Mouth Rinse 4 times per day   pantoprazole   80 mg Oral BID   scopolamine   1 patch Transdermal Q72H   senna-docusate  1 tablet Oral BID   sucralfate   1 g Oral TID   Continuous Infusions:  promethazine  (PHENERGAN ) injection (IM or IVPB) 12.5 mg (12/31/24 2050)   TPN ADULT (ION) 90 mL/hr at 01/04/25 1219   TPN ADULT (ION)       LOS: 37 days    Time spent: 49 minutes spent on 01/04/2025 caring for this patient face-to-face including chart review, ordering labs/tests, documenting, discussion with nursing staff, consultants, updating family and interview/physical exam    Camellia PARAS Vangie Henthorn,  DO [facility] Available via Epic secure chat 7am-7pm After these hours, please refer to coverage provider listed on [email or web address] 01/04/2025, 12:31 PM   "

## 2025-01-04 NOTE — TOC Progression Note (Signed)
 Transition of Care [facility]) - Progression Note    Patient Details  Name: Henry May MRN: [id number] Date of Birth: [birth year]  [facility] CM/SW Contact  Tawni CHRISTELLA Eva, LCSW Phone Number: 01/04/2025, 1:20 PM  Clinical Narrative:     CSW spoke with Pam. The pts insurance authorization was approved, and the pt is set to discharge tomorrow. She reports that additional teaching has been provided. The family was updated. ICM to follow.     Barriers to Discharge: Insurance Authorization               Expected Discharge Plan and Services                                               Social Drivers of Health (SDOH) Interventions SDOH Screenings   Food Insecurity: Patient Declined (11/28/2024)  Housing: Unknown (11/28/2024)  Transportation Needs: Patient Declined (11/28/2024)  Utilities: Patient Declined (11/28/2024)  Depression (PHQ2-9): Medium Risk (11/26/2024)  Social Connections: Unknown (04/30/2022)   Received from [facility]  Tobacco Use: Medium Risk (11/30/2024)    Readmission Risk Interventions    12/06/2024   10:13 AM 02/07/2024    8:40 AM  Readmission Risk Prevention Plan  Transportation Screening Complete Complete  HRI or Home Care Consult Complete Complete  Social Work Consult for Recovery Care Planning/Counseling Complete Complete  Palliative Care Screening Not Applicable Not Applicable  Medication Review Oceanographer) Complete Complete

## 2025-01-04 NOTE — Progress Notes (Addendum)
 PHARMACY - TOTAL PARENTERAL NUTRITION CONSULT NOTE   Indication: intolerance to enteral feeds  Patient Measurements: Height: 6' (182.9 cm) Weight: 74.2 kg (163 lb 9.3 oz) IBW/kg (Calculated) : 77.6 TPN AdjBW (KG): 70 Body mass index is 22.19 kg/m.  Assessment: 6 yoM admitted on 12/13 with intractable nausea and vomiting, dilated small bowel loops, and coffee-ground emesis. 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 He underwent EGD 12/16 showing esophagitis, gastric stenosis, stent placed, impaired peristalsis. Pharmacy was consulted to dose TPN on 12/20 for intolerance to enteral feeding.    - 1/12 Octreotide  resumed (as 100 mcg BID injections) - 1/14 TPN line broke during transport around 10a; D10 hung until new TPN started ~6p  Glucose / Insulin : No hx DM; A1c 5.7% in Dec - CBGs stable/WNL - 2 units mSSI given yesterday Electrolytes: Na still slightly elevated but stable; K improved to WNL after repletion and adjusting TPN yesterday; bicarb trending down from borderline high; remaining WNL Renal: SCr stable at baseline (<1); BUN elevated  & trending up; UOP not recorded Hepatic: Alk Phos remains elevated and trending up (currently no metastatic bone disease per Onc); albumin  remains low/stable; otherwise WNL - TG mildly elevated 1/19 Intake / Output: minimal PO intake remains poor, continues to have intermittent vomiting though less nausea; no drains or recent diuretics - UOP: x1 (not quantitatively charting)  - feeding supplement: 120 ml yesterday - LBM 1/15 GI Imaging: - 12/18 AXR: No evidence of obstruction; 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; dilated duodenum - 12/25 AXR: nonobstructive bowel gas pattern - 12/27 AXR: unchanged - 12/28 CT: nonspecific esophageal distention, enteritis, new small volume of ascites - 1/8 AXR: nonobstructive bowel gas pattern, otherwise unchanged GI Surgeries / Procedures:  -12/16 EGD: esophagitis  GI meds:   - Protonix  80  mg PO BID (increased 1/8) - Sucralfate  1g PO TID - Octreotide  100 mg SQ BID - PRN phenergan  - scopolamine  patch (1/16) - Zyprexa  5 mg PO daily (per Onc) as an adjunct for n/v  Central access: Implanted Port (01/22/24) TPN start date: 12/20   Nutritional Goals: TPN at 90 ml/hr provides 119 g of protein and 2298 kcals per day  RD Assessment: Estimated Needs Total Energy Estimated Needs: 2100-2450 kcals Total Protein Estimated Needs: 105-120 grams Total Fluid Estimated Needs: >/= 2.1L  Current Nutrition:  FLD started 1/11 1/15 Ensure changed to [employer] 1.4 po BID  Plan:  Continue TPN at 90 ml/hr For discharge planning, please note that at least 2-3 days are required AFTER becoming stable on continuous TPN to fully transition patients to cyclic TPN. Also note that transition to cyclic TPN can be done in the outpatient setting. Electrolytes in TPN: no changes - ensure Na and bicarb continue to correct Na 50 mEq/L K 50 mEq/L Ca 2 mEq/L Mg 5 mEq/L Phos 20 mmol/L Cl:Ac 1:2 Add standard MVI and trace elements to TPN Chromium remains on hold d/t national shortage Will d/c SSI/CBG monitoring to minimize finger sticks, given good CBG control (watch if repeats chemo - includes Decadron ) Maintenance IV fluids per MD (none currently) TPN labs on Mon/Thurs and PRN, BMP tomorrow am   Thank you for allowing pharmacy to be a part of this patients care.  Bard Jeans, PharmD, BCPS [phone] 01/04/2025, 9:09 AM

## 2025-01-04 NOTE — Progress Notes (Signed)
 IP PROGRESS NOTE  Subjective:   Henry May reports vomiting 5 times yesterday.  Nausea remains improved.  No hematemesis.  No bowel movement or flatus.  No pain.  He has exertional dyspnea. Objective: Vital signs in last 24 hours: Blood pressure 104/72, pulse 95, temperature 97.6 F (36.4 C), temperature source Oral, resp. rate 16, height 6' (1.829 m), weight 163 lb 9.3 oz (74.2 kg), SpO2 96%.  Intake/Output from previous day: 01/19 0701 - 01/20 0700 In: 2213.8 [P.O.:120; I.V.:2093.8] Out: 550 [Emesis/NG output:550]  Physical Exam:  HEENT: No thrush Cardiac: Regular rate and rhythm Lungs: End inspiratory rhonchi/rales at the bilateral posterior base, no respiratory distress Abdomen: Soft, nontender, no hepatomegaly Extremities: No leg edema  Portacath/PICC-without erythema  Lab Results: Recent Labs    01/03/25 0705  WBC 3.3*  HGB 8.4*  HCT 27.8*  PLT 299      BMET Recent Labs    01/03/25 0705 01/04/25 0355  NA 150* 149*  K 3.5 4.2  CL 110 110  CO2 33* 31  GLUCOSE 130* 151*  BUN 34* 37*  CREATININE 0.71 0.79  CALCIUM  8.9 8.8*    Lab Results  Component Value Date   CEA 3.57 01/15/2024      Medications: I have reviewed the patient's current medications.  Assessment/Plan: Gastric cancer 11/21/2023 EGD-gastric body with infiltrative looking lesion; biopsy shows involvement of a hypercellular lesion that suggests diffuse carcinoma by morphology CTs abdomen/pelvis 11/28/2023-diffuse fatty infiltration of the liver; gastric wall thickening; lymph nodes up to 6 mm in diameter near the stomach wall. 12/31/2023 upper endoscopy-large diffuse friable infiltrative polypoid and ulcerated circumferential mass found in the gastric body.  Scope was passed beyond the mass with normal antrum/pylorus.  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  Negative for HER2 (1+); mismatch repair protein IHC  normal; PD-L1 CPS 0%; CLDN18 positive: 90% of tumor cells with 2+/3+ membrane staining PET scan 01/07/2024-circumferential hypermetabolic gastric mucosal thickening.  Ill-defined hypermetabolic lymph nodes in the gastrohepatic ligament.  Intense hypermetabolic activity associated with the right lobe of the thyroid  gland.  Small hypermetabolic right axillary node favored reactive. 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  Gastric wall thickening slightly improved. Cycle 7 oxaliplatin , zolbetuximab 05/05/2024, Fulphila  Cycle 8 zolbetuximab 05/20/2024, oxaliplatin  held due to neuropathy, no Fulphila  Cycle 9 oxaliplatin /zolbetuximab 06/03/2024, Fulphila  Cycle 10 oxaliplatin /zolbetuximab 06/17/2024, Fulphila  Cycle 11 oxaliplatin /zolbetuximab 06/30/2024, Fulphila , oxaliplatin  dose reduced Cycle 12 zolbetuximab 07/15/2024, oxaliplatin  held secondary to neuropathy Cycle 13 zolbetuximab 07/29/2024, oxaliplatin  held secondary to neuropathy Cycle 14 zolbetuximab 08/26/2024, oxaliplatin  held secondary to neuropathy 09/01/2024 CTs-mild distal gastric wall thickening without discrete mass.  No metastatic disease. Cycle 15 zolbetuximab 09/09/2024, oxaliplatin  held due to neuropathy Cycle 16 zolbetuximab 09/30/2024, oxaliplatin  held due to neuropathy Cycle 17 zolbetuximab 10/21/2024, oxaliplatin  held due to neuropathy 11/03/2024 CT  abdomen/pelvis: Diffuse mild wall thickening of the distal gastric body and antrum-stable, mild increase in a right gastric lymph node small volume ascites without omental nodularity, proximal jejunal mural thickening 11/04/2024 upper GI: Narrowing of the gastric antrum, normal gastric emptying, dilated  duodenum and proximal jejunum without evidence of a bowel obstruction 11/18/2024 upper endoscopy: Tumor in the gastric body and antrum with antral narrowing, dilated duodenum without evidence of mass or obstruction, esophagitis: Biopsy of the gastric mass-poorly differentiated adenocarcinoma with focal signet ring morphology, esophagus biopsy: Squamous mucosa with focal ulceration, Foundation 1-BRCA2 subclonal, ATM, HRD signature positive, MSS, tumor mutation burden 6, [facility] 1 Cycle 1 weekly cisplatin  11/26/2024 Cycle 2 weekly cisplatin  12/07/2024 Cycle 3 weekly cisplatin  12/15/2024 Radiation to the gastric mass 12/17/2024 Cycle 4 weekly cisplatin  12/22/2024 12/12/2024 CTs: Pneumomediastinum extending into the neck, lower lobe consolidation, esophagus distention, air in the bladder, new mildly enlarged right hilar node, new small volume ascites Cycle 5 weekly cisplatin  12/29/2024 Early satiety, postprandial abdominal pain, weight loss secondary to #1 History of bilateral lower extremity DVT 2021-anticoagulation discontinued due to massive retroperitoneal bleed, IVC filter placed 08/24/2020 Hospitalized with acute respiratory failure due to COVID-19 08/06/2020 - 09/04/2020 History of massive retroperitoneal bleed secondary to anticoagulation September 2021 Thyroid  ultrasound 01/13/2024-no abnormal nodule identified in the right lobe.  1.1 cm thyroid  isthmus nodule meets criteria for 1 year follow-up ultrasound. Admission 02/01/2024 with increased abdominal pain/ascites Hospitalization with altered mental status 02/05/2024 through 02/13/2024; question rare case of 5-FU psychosis.  Improved 02/16/2024.  Mental status at  baseline 02/23/2024. Neutropenia 02/16/2024 Mucositis 02/16/2024.  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  Persistent cold sensitivity 05/20/2024, oxaliplatin  held.  Cold sensitivity resolved 06/03/2024, oxaliplatin  resumed.  Mild loss of vibratory sense, oxaliplatin  dose reduced 06/29/2024 Admission 11/28/2024 with intractable nausea and vomiting 11/27/2024 CT Abdo/pelvis: Bilateral lower lobe aspiration/pneumonia, thick-walled gastric body/antrum, dilated proximal small bowel, small volume pelvic ascites 11/30/2024 EGD: Malignant appearing severe stenosis at the gastric antrum-stent placed, dilated duodenum with retained fluid 15.  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  There is evidence of disease progression with persistent/progressive tumor in the stomach and intractable nausea/vomiting.  The omental disease on presentation last year remains significantly improved, by CT criteria.  He responded well to initial treatment with oxaliplatin  based chemotherapy.  He had neuropathy symptoms from oxaliplatin .  He had psychosis following 5-fluorouracil .  He began a trial of salvage therapy with cisplatin  on 11/26/2024.  He completed cycle 5 on 12/29/2024.  He completed palliative radiation to the gastric mass 12/30/2024.   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  There was retained fluid in the stomach and duodenum.  A gastric antral stent was placed.  He continues to have nausea and vomiting despite placement of the gastric antral stent.  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  The nausea partially  improved with octreotide .  He feels octreotide  and IV Phenergan  help the nausea.  Oral Phenergan , lorazepam , Decadron , metoclopramide , and ondansetron  have not helped.  A scopolamine  patch and Zyprexa  were added 01/01/2024.  Nausea has improved.  Hematemesis has improved following palliative radiation.  He received 2 units of packed red blood cells on 12/23/2024.  He began palliative radiation to the gastric mass on 12/17/2024 and completed the final treatment with radiation 12/30/2024.  Mr Raatz  developed CT evidence of progressive aspiration pneumonia.  He also had pneumomediastinum with subcutaneous emphysema extending to the neck on a  CT 12/12/2024.  The significance of the emphysematous findings is unclear.  I reviewed the CT images.  He may have a tumor related microperforation or perforation related to emesis.  Mr Briner has been evaluated by the palliative care service.  The family has met with the palliative care team. They understand the poor prognosis.   He confirmed his desire to continue treatment.  We submitted tissue for molecular testing to look for options for targeted treatment and assess the tumor mutation burden.  The results reveal a subclonal BRCA2 alteration, a positive HRD signal, and an ATM alteration.  He may be a candidate for a PARP inhibitor if his clinical status improves.  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  We will arrange genetic testing to rule out a germline BRCA2 or CDH 1 mutation.  Mr Gammel has changed insurance plans.  [facility] is no longer in network.  He and his family are very concerned about the lack of insurance coverage.  He is not a candidate for Medicare or Medicaid.  He is eligible for treatment at the [facility].  We will consider transferring his oncology care to the [facility].   Recommendations: Continue TPN, liquids as tolerated Continue current antiemetic regimen Packed red blood cells as needed for severe anemia Out of bed, ambulate as tolerated Try stopping  octreotide  but the nausea has improved Discharge planning to include home TPN 7.  Obtain germline genetic testing, we are arranging for testing at the main cancer center on the day of discharge from the hospital 8.  Outpatient follow-up will be scheduled at the Cancer center or at the [facility] approval   LOS: 37 days   Arley Hof, MD   01/04/2025, 11:36 AM

## 2025-01-05 ENCOUNTER — Other Ambulatory Visit (HOSPITAL_COMMUNITY): Payer: Self-pay

## 2025-01-05 ENCOUNTER — Other Ambulatory Visit: Payer: Self-pay

## 2025-01-05 ENCOUNTER — Encounter: Payer: Self-pay | Admitting: Oncology

## 2025-01-05 ENCOUNTER — Inpatient Hospital Stay

## 2025-01-05 DIAGNOSIS — C169 Malignant neoplasm of stomach, unspecified: Secondary | ICD-10-CM

## 2025-01-05 DIAGNOSIS — K920 Hematemesis: Secondary | ICD-10-CM | POA: Diagnosis not present

## 2025-01-05 LAB — BASIC METABOLIC PANEL WITH GFR
Anion gap: 7 (ref 5–15)
BUN: 40 mg/dL — ABNORMAL HIGH (ref 6–20)
CO2: 31 mmol/L (ref 22–32)
Calcium: 9 mg/dL (ref 8.9–10.3)
Chloride: 111 mmol/L (ref 98–111)
Creatinine, Ser: 0.84 mg/dL (ref 0.61–1.24)
GFR, Estimated: >60 mL/min
Glucose, Bld: 130 mg/dL — ABNORMAL HIGH (ref 70–99)
Potassium: 4 mmol/L (ref 3.5–5.1)
Sodium: 149 mmol/L — ABNORMAL HIGH (ref 135–145)

## 2025-01-05 LAB — GENETIC SCREENING ORDER

## 2025-01-05 MED ORDER — SENNOSIDES-DOCUSATE SODIUM 8.6-50 MG PO TABS
1.0000 | ORAL_TABLET | Freq: Two times a day (BID) | ORAL | 0 refills | Status: AC
  Filled 2025-01-05: qty 180, 90d supply, fill #0

## 2025-01-05 MED ORDER — PANTOPRAZOLE SODIUM 40 MG PO PACK
80.0000 mg | PACK | Freq: Two times a day (BID) | ORAL | 0 refills | Status: DC
  Filled 2025-01-05: qty 360, 90d supply, fill #0

## 2025-01-05 MED ORDER — ROBAFEN DM 20-200 MG/20ML PO LIQD
5.0000 mL | ORAL | 0 refills | Status: AC | PRN
  Filled 2025-01-05: qty 118, 10d supply, fill #0

## 2025-01-05 MED ORDER — HYDROMORPHONE HCL 2 MG PO TABS
2.0000 mg | ORAL_TABLET | Freq: Four times a day (QID) | ORAL | 0 refills | Status: AC | PRN
  Filled 2025-01-05: qty 30, 7d supply, fill #0

## 2025-01-05 MED ORDER — SCOPOLAMINE 1 MG/3DAYS TD PT72
1.0000 | MEDICATED_PATCH | TRANSDERMAL | 0 refills | Status: AC
  Filled 2025-01-05: qty 10, 30d supply, fill #0

## 2025-01-05 MED ORDER — PANTOPRAZOLE SODIUM 40 MG PO TBEC
80.0000 mg | DELAYED_RELEASE_TABLET | Freq: Two times a day (BID) | ORAL | 0 refills | Status: AC
  Filled 2025-01-05: qty 360, 90d supply, fill #0

## 2025-01-05 MED ORDER — ONDANSETRON 8 MG PO TBDP
8.0000 mg | ORAL_TABLET | Freq: Three times a day (TID) | ORAL | 3 refills | Status: AC | PRN
  Filled 2025-01-05: qty 40, 14d supply, fill #0

## 2025-01-05 MED ORDER — OLANZAPINE 5 MG PO TABS
5.0000 mg | ORAL_TABLET | Freq: Every day | ORAL | 0 refills | Status: AC
  Filled 2025-01-05: qty 90, 90d supply, fill #0

## 2025-01-05 MED ORDER — LORAZEPAM 2 MG/ML PO CONC
1.0000 mg | Freq: Four times a day (QID) | ORAL | 0 refills | Status: AC | PRN
  Filled 2025-01-05: qty 30, 15d supply, fill #0

## 2025-01-05 NOTE — TOC Transition Note (Signed)
 Transition of Care [facility]) - Discharge Note  Patient Details  Name: Henry May MRN: [id number] Date of Birth: [birth year]  Transition of Care [facility]) CM/SW Contact:  Duwaine GORMAN Aran, LCSW Phone Number: 01/05/2025, 12:17 PM  Clinical Narrative: Patient will discharge home today. CSW confirmed with Pam with [employer] that TPN will be delivered to the home today and nursing is set up. CSW spoke with spouse regarding home oxygen . Spouse has no agency preference. CSW made home oxygen  referral to [facility] with [employer], but patient's insurance is out of network. CSW made home oxygen  referral to [facility] with [facility]. [facility] to deliver POC to room. Care management signing off.  Final next level of care: Home w Home Health Services Barriers to Discharge: Barriers Resolved  Patient Goals and CMS Choice Patient states their goals for this hospitalization and ongoing recovery are:: Home CMS Medicare.gov Compare Post Acute Care list provided to:: Patient Represenative (must comment) Choice offered to / list presented to : Spouse, Patient  Discharge Plan and Services Additional resources added to the After Visit Summary for      DME Arranged: Oxygen  DME Agency: Other - Comment Britt) Date DME Agency Contacted: 01/05/25 Time DME Agency Contacted: 1026 Representative spoke with at DME Agency: [facility] HH Arranged: RN, TPN HH Agency: [facility] Date HH Agency Contacted: 01/05/25 Time HH Agency Contacted: 0911 Representative spoke with at [facility] Agency: Pam  Social Drivers of Health (SDOH) Interventions SDOH Screenings   Food Insecurity: Patient Declined (11/28/2024)  Housing: Unknown (11/28/2024)  Transportation Needs: Patient Declined (11/28/2024)  Utilities: Patient Declined (11/28/2024)  Depression (PHQ2-9): Medium Risk (11/26/2024)  Social Connections: Unknown (04/30/2022)   Received from [facility]  Tobacco Use: Medium Risk (11/30/2024)   Readmission Risk Interventions    12/06/2024   10:13 AM  02/07/2024    8:40 AM  Readmission Risk Prevention Plan  Transportation Screening Complete Complete  HRI or Home Care Consult Complete Complete  Social Work Consult for Recovery Care Planning/Counseling Complete Complete  Palliative Care Screening Not Applicable Not Applicable  Medication Review Oceanographer) Complete Complete

## 2025-01-05 NOTE — Progress Notes (Addendum)
 IP PROGRESS NOTE  Subjective:   Henry. Chelf reports improvement in nausea.  He continues to have intermittent vomiting, 4 times yesterday.  No hematemesis.  No bowel movement.  No pain. Objective: Vital signs in last 24 hours: Blood pressure 109/75, pulse 97, temperature 98 F (36.7 C), resp. rate 18, height 6' (1.829 m), weight 164 lb 10.9 oz (74.7 kg), SpO2 98%.  Intake/Output from previous day: 01/20 0701 - 01/21 0700 In: 2419.7 [P.O.:240; I.V.:2179.7] Out: 1500 [Emesis/NG output:1500]  Physical Exam:  HEENT: No thrush Cardiac: Regular rate and rhythm Lungs: End inspiratory rhonchi/rales at the right posterior base, no respiratory distress Abdomen: Soft, nontender, no hepatomegaly Extremities: No leg edema  Portacath/PICC-without erythema  Lab Results: Recent Labs    01/03/25 0705  WBC 3.3*  HGB 8.4*  HCT 27.8*  PLT 299      BMET Recent Labs    01/04/25 0355 01/05/25 0407  NA 149* 149*  K 4.2 4.0  CL 110 111  CO2 31 31  GLUCOSE 151* 130*  BUN 37* 40*  CREATININE 0.79 0.84  CALCIUM  8.8* 9.0    Lab Results  Component Value Date   CEA 3.57 01/15/2024      Medications: I have reviewed the patient's current medications.  Assessment/Plan: Gastric cancer 11/21/2023 EGD-gastric body with infiltrative looking lesion; biopsy shows involvement of a hypercellular lesion that suggests diffuse carcinoma by morphology CTs abdomen/pelvis 11/28/2023-diffuse fatty infiltration of the liver; gastric wall thickening; lymph nodes up to 6 mm in diameter near the stomach wall. 12/31/2023 upper endoscopy-large diffuse friable infiltrative polypoid and ulcerated circumferential mass found in the gastric body.  Scope was passed beyond the mass with normal antrum/pylorus.  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  Negative for HER2 (1+); mismatch repair protein IHC normal; PD-L1 CPS 0%; CLDN18  positive: 90% of tumor cells with 2+/3+ membrane staining PET scan 01/07/2024-circumferential hypermetabolic gastric mucosal thickening.  Ill-defined hypermetabolic lymph nodes in the gastrohepatic ligament.  Intense hypermetabolic activity associated with the right lobe of the thyroid  gland.  Small hypermetabolic right axillary node favored reactive. 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  Gastric wall thickening slightly improved. Cycle 7 oxaliplatin , zolbetuximab 05/05/2024, Fulphila  Cycle 8 zolbetuximab 05/20/2024, oxaliplatin  held due to neuropathy, no Fulphila  Cycle 9 oxaliplatin /zolbetuximab 06/03/2024, Fulphila  Cycle 10 oxaliplatin /zolbetuximab 06/17/2024, Fulphila  Cycle 11 oxaliplatin /zolbetuximab 06/30/2024, Fulphila , oxaliplatin  dose reduced Cycle 12 zolbetuximab 07/15/2024, oxaliplatin  held secondary to neuropathy Cycle 13 zolbetuximab 07/29/2024, oxaliplatin  held secondary to neuropathy Cycle 14 zolbetuximab 08/26/2024, oxaliplatin  held secondary to neuropathy 09/01/2024 CTs-mild distal gastric wall thickening without discrete mass.  No metastatic disease. Cycle 15 zolbetuximab 09/09/2024, oxaliplatin  held due to neuropathy Cycle 16 zolbetuximab 09/30/2024, oxaliplatin  held due to neuropathy Cycle 17 zolbetuximab 10/21/2024, oxaliplatin  held due to neuropathy 11/03/2024 CT abdomen/pelvis: Diffuse mild wall  thickening of the distal gastric body and antrum-stable, mild increase in a right gastric lymph node small volume ascites without omental nodularity, proximal jejunal mural thickening 11/04/2024 upper GI: Narrowing of the gastric antrum, normal gastric emptying, dilated duodenum and proximal jejunum without  evidence of a bowel obstruction 11/18/2024 upper endoscopy: Tumor in the gastric body and antrum with antral narrowing, dilated duodenum without evidence of mass or obstruction, esophagitis: Biopsy of the gastric mass-poorly differentiated adenocarcinoma with focal signet ring morphology, esophagus biopsy: Squamous mucosa with focal ulceration, Foundation 1-BRCA2 subclonal, ATM, HRD signature positive, MSS, tumor mutation burden 6, [facility] 1 Cycle 1 weekly cisplatin  11/26/2024 Cycle 2 weekly cisplatin  12/07/2024 Cycle 3 weekly cisplatin  12/15/2024 Radiation to the gastric mass 12/17/2024 Cycle 4 weekly cisplatin  12/22/2024 12/12/2024 CTs: Pneumomediastinum extending into the neck, lower lobe consolidation, esophagus distention, air in the bladder, new mildly enlarged right hilar node, new small volume ascites Cycle 5 weekly cisplatin  12/29/2024 Early satiety, postprandial abdominal pain, weight loss secondary to #1 History of bilateral lower extremity DVT 2021-anticoagulation discontinued due to massive retroperitoneal bleed, IVC filter placed 08/24/2020 Hospitalized with acute respiratory failure due to COVID-19 08/06/2020 - 09/04/2020 History of massive retroperitoneal bleed secondary to anticoagulation September 2021 Thyroid  ultrasound 01/13/2024-no abnormal nodule identified in the right lobe.  1.1 cm thyroid  isthmus nodule meets criteria for 1 year follow-up ultrasound. Admission 02/01/2024 with increased abdominal pain/ascites Hospitalization with altered mental status 02/05/2024 through 02/13/2024; question rare case of 5-FU psychosis.  Improved 02/16/2024.  Mental status at baseline 02/23/2024. Neutropenia  02/16/2024 Mucositis 02/16/2024.  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  Persistent cold sensitivity 05/20/2024, oxaliplatin  held.  Cold sensitivity resolved 06/03/2024, oxaliplatin  resumed.  Mild loss of vibratory sense, oxaliplatin  dose reduced 06/29/2024 Admission 11/28/2024 with intractable nausea and vomiting 11/27/2024 CT Abdo/pelvis: Bilateral lower lobe aspiration/pneumonia, thick-walled gastric body/antrum, dilated proximal small bowel, small volume pelvic ascites 11/30/2024 EGD: Malignant appearing severe stenosis at the gastric antrum-stent placed, dilated duodenum with retained fluid 15.  Anemia secondary to GI bleeding 12/12/2024 2 units RBCs, 2 units RBCs 12/16/2024, 2 units RBCs 12/18/2024, 2 units RBCs 12/23/2024 IV iron  12/21/2024   Henry May has metastatic gastric cancer.  There is evidence of disease progression with persistent/progressive tumor in the stomach and intractable nausea/vomiting.  The omental disease on presentation last year remains significantly improved, by CT criteria.  He responded well to initial treatment with oxaliplatin  based chemotherapy.  He had neuropathy symptoms from oxaliplatin .  He had psychosis following 5-fluorouracil .  He began a trial of salvage therapy with cisplatin  on 11/26/2024.  He completed cycle 5 on 12/29/2024.  He completed palliative radiation to the gastric mass 12/30/2024.   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  There was retained fluid in the stomach and duodenum.  A gastric antral stent was placed.  He continues to have nausea and vomiting despite placement of the gastric antral stent.  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  The nausea partially improved with octreotide .  He  feels octreotide  and IV Phenergan  help the nausea.  Oral Phenergan , lorazepam , Decadron , metoclopramide , and ondansetron  have not helped.  A scopolamine  patch and Zyprexa  were added 01/01/2024.  Nausea has improved.  Hematemesis improved following palliative radiation.  He received 2 units of packed red blood cells on 12/23/2024.  He began palliative radiation to the gastric mass on 12/17/2024 and completed the final treatment with radiation 12/30/2024.  Hemoglobin was stable on 01/03/2025.  Henry Zwahlen  developed CT evidence of progressive aspiration pneumonia.  He also had pneumomediastinum with subcutaneous emphysema extending to the neck on a  CT 12/12/2024.  The significance of the emphysematous findings is unclear.  I reviewed the CT images.  He may have a tumor related microperforation or perforation related to emesis.  Henry Slape has been evaluated by the palliative care service.  The family has met with the palliative care team. They understand the poor prognosis.   He confirmed his desire to continue treatment.  We submitted tissue for molecular testing to look for options for targeted treatment and assess the tumor mutation burden.  The results reveal a subclonal BRCA2 alteration, a positive HRD signal, and an ATM alteration.  He may be a candidate for a PARP inhibitor if his clinical status improves.  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  We will arrange genetic testing to rule out a germline BRCA2 or CDH 1 mutation.  Henry Gardella has changed insurance plans.  [facility] is no longer in network.  He and his family are very concerned about the lack of insurance coverage.  He is not a candidate for Medicare or Medicaid.  He is eligible for treatment at the [facility].  We I have contacted the [facility] oncology clinic to arrange follow-up care there.  Hopefully  and they will approve him for treatment at our cancer center.  The patient and his wife know to contact us  for any questions or concerns.   Recommendations: Home  TPN, liquids as tolerated Continue scopolamine , Zyprexa , and as needed sublingual lorazepam  3.  Obtain germline genetic testing, we are arranging for testing at the main cancer center on the day of discharge from the hospital 4.  Continue antiacid therapy 5.  We will continue working on follow-up at the [facility] oncology clinic versus our clinic depending on insurance coverage   LOS: 38 days   Arley Hof, MD   01/05/2025, 7:55 AM Addendum: We are informed that he is not eligible for treatment at the [facility], he is only eligible [facility] a [facility] with [facility] [employer].  [facility] is in network with his current insurance plan.  We will make a referral to the [facility] clinic.  I will contact a physician there.

## 2025-01-05 NOTE — Progress Notes (Signed)
 PHARMACY - TOTAL PARENTERAL NUTRITION CONSULT NOTE   Indication: intolerance to enteral feeds  Patient Measurements: Height: 6' (182.9 cm) Weight: 74.7 kg (164 lb 10.9 oz) IBW/kg (Calculated) : 77.6 TPN AdjBW (KG): 70 Body mass index is 22.34 kg/m.  Assessment: 67 yoM admitted on 12/13 with intractable nausea and vomiting, dilated small bowel loops, and coffee-ground emesis. 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 He underwent EGD 12/16 showing esophagitis, gastric stenosis, stent placed, impaired peristalsis. Pharmacy was consulted to dose TPN on 12/20 for intolerance to enteral feeding.    - 1/12 Octreotide  resumed (as 100 mcg BID injections) - 1/14 TPN line broke during transport around 10a; D10 hung until new TPN started ~6p - 1/21 Planning discharge on home TPN with [facility]  Glucose / Insulin : No hx DM; A1c 5.7% in Dec - SBG stable; no longer checking CBGs;  Electrolytes: Na still slightly elevated but stable; others now stable WNL Renal: SCr stable at baseline (<1); BUN elevated  & trending up, still making regular urine Hepatic: (1/19) Alk Phos remains elevated and trending up (currently no metastatic bone disease per Onc); albumin  remains low/stable; otherwise WNL - TG mildly elevated (1/19) Intake / Output: nausea improved somewhat with new antiemetic meds (scopolamine , zyprexa ), but still with poor PO intake and intermittent emesis - UOP: x3 (not quantitatively charting)  - feeding supplement: 120 ml yesterday - LBM 1/15 (not unexpected w/ low residue supplements & minimal PO intake) GI Imaging: - 12/18 AXR: No evidence of obstruction; 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; dilated duodenum - 12/25 AXR: nonobstructive bowel gas pattern - 12/27 AXR: unchanged - 12/28 CT: nonspecific esophageal distention, enteritis, new small volume of ascites - 1/8 AXR: nonobstructive bowel gas pattern, otherwise unchanged GI Surgeries / Procedures:  -12/16 EGD:  esophagitis  GI meds:   - Protonix  80 mg PO BID (increased 1/8) - Sucralfate  1g PO TID - Octreotide  100 mg SQ BID - PRN phenergan  - scopolamine  patch (1/16) - Zyprexa  5 mg PO daily (per Onc) as an adjunct for n/v  Central access: Implanted Port (01/22/24) TPN start date: 12/20   Nutritional Goals: TPN at 90 ml/hr provides 119 g of protein and 2298 kcals per day  RD Assessment: Estimated Needs Total Energy Estimated Needs: 2100-2450 kcals Total Protein Estimated Needs: 105-120 grams Total Fluid Estimated Needs: >/= 2.1L  Current Nutrition:  FLD, TPN [employer] 1.4 po BID  Plan:  [facility] to coordinate TPN & monitoring after discharge Instructed RN to reduce TPN rate to 40 ml/hr at least 1 hr prior to stopping TPN today (to avoid hypoglycemia) No further electrolyte supplementation needed today   Thank you for allowing pharmacy to be a part of this patients care.  Bard Jeans, PharmD, BCPS [phone] 01/05/2025, 11:26 AM

## 2025-01-10 ENCOUNTER — Other Ambulatory Visit: Payer: Self-pay | Admitting: *Deleted

## 2025-01-10 DIAGNOSIS — C169 Malignant neoplasm of stomach, unspecified: Secondary | ICD-10-CM

## 2025-01-13 ENCOUNTER — Encounter: Payer: Self-pay | Admitting: *Deleted

## 2025-01-13 NOTE — Progress Notes (Signed)
 Foundation one results and Oncology flowsheet sent to [facility] for upcoming appt

## 2025-01-14 ENCOUNTER — Encounter: Payer: Self-pay | Admitting: Oncology

## 2025-01-17 NOTE — Progress Notes (Signed)
 "  Outpatient Oncology Nutrition Supportive Oncology [facility]   Outpatient Oncology Nutrition Note   PATIENT NAME:  Henry May DATE:  01/17/2025  TIME:  4:12 PM  Note Type: Assessment Referral Reason:  PN, Nutrition consult Encounter Type: In-person  Patient Overview: Henry May is a 59 y.o. 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 TPN initiated at [facility] 12/04/24 per notes.   Interventions:    Nutrition Intervention: Parenteral nutrition, Nutrition education  Intervention Details:  TPN - continue per home infusion pharmacy   Continue with sips of fluids for pleasure  Discussed strategies for dry mouth, provided xylimelt samples   Enteral Nutrition:  Enteral: No     *Please provide above formula or comparable if formula is on backorder/out of stock  Home Health Care Company: [employer] [phone]; Fax [phone])   Nutrition Education:  No Handouts Provided (n/a): None  Learner: Family Preferred Learning Methods: Discussion Method of Teaching: Verbal instructions Learner Response: Demonstrates acceptable knowledge of topic/instructions  Nutrition Intervention Barriers: None  Nutrition Assessment Summary:        Introduced myself to patient this afternoon in infusion room and patient requested that I speak with wife and daughter who were in the waiting area. Spoke with them regarding patient's TPN and discussed nutrition and weight history. Patient receiving continuous TPN with [facility]. During hospital stay at [facility], they had cycled patient down to 16 hour infusion but increased again to 24 hours due to resumption of Octreotide .  Spoke with [facility] infusion pharmacist, who would like to try to cycle patient down to 12 hours if tolerated. Patient sips on fluids or eats popsicles. He has not wanted to consume any liquid nutrition supplements  recently.   Assessment:   PAST MEDICAL HISTORY: Medical History[1]   PAST SURGICAL HISTORY: Surgical History[2]   Home Medications:  Prior to Admission medications  Medication  dextromethorphan -guaiFENesin  (Robafen DM) 5-50 mg/5 mL liqd  HYDROmorphone  (DILAUDID ) 2 mg tablet  LORazepam  2 mg/mL conc solution  OLANZapine  (ZyPREXA ) 5 mg tablet  ondansetron  (ZOFRAN -ODT) 8 mg disintegrating tablet  pantoprazole  (PROTONIX ) 40 mg EC tablet  scopolamine  (TRANSDERM-SCOP) 1 mg over 3 days pt3d patch  sennosides-docusate sodium  (PERICOLACE) 8.6-50 mg per tablet  sucralfate  (CARAFATE ) 100 mg/mL oral suspension  sucralfate  (CARAFATE ) 100 mg/mL oral suspension    Nutrition Pertinent Herbal Medications/Supplements: None reported  Relevant Labs: Nutrition-related labs reviewed from 01/17/25  Nutrition Focused Physical Exam: NFPE deferred; will attempt at future visit if able/applicable      Symptoms: Nausea Vomiting Constipation Weight Loss Dry Mouth Fatigue  Anthropometrics: Wt Readings from Last 1 Encounters:  01/17/25 64.9 kg (143 lb)   Ht Readings from Last 1 Encounters:  01/17/25 1.829 m (6')   BMI Readings from Last 1 Encounters:  01/17/25 19.39 kg/m   Weight Classification: Normal BMI: 19.39  Weight History: IBW: 178 (if applicable)  Wt Readings from Last 10 Encounters:  01/17/25 64.9 kg (143 lb)  09/09/24 - 203#  60# weight loss within the past 4 months (30%) - severe  Stated Nutrition Intake History:    Sips of fluids   24 hour recall:   TPN - exact formulation unknown, was meeting needs during hospital stay at [facility]    Estimate Pt is meeting 75-100% of estimated needs  Religious, cultural, ethnic or personal food preferences: none noted  Food Allergies: no known food allergies  Estimated Needs:   Nutrition Needs Based On: Actual weight  Weight Used: 64.9 kg (143 lb 1.3 oz)  Calories (kcal/day): 8052-7727  Calories based on: 30-35  kcal/kg  Protein (g/day): 78-97  Protein based on: 1.2-1.5g/kg  Fluid (mL/day):  , 1-1.1 mL/kcal  Nutrition Diagnosis:   Nutrition Diagnosis: Inadequate oral intake Problem Related To: Chronic illness, Alteration in GI structure/function Problem as Evidenced By: need to parenteral nutrition            Goals, Monitoring & Evaluation:         Minimize nutrition impact symptoms - Initial Tolerance to nutrition support - Initial  Follow-up Information:    RDN will follow during treatment per clinical judgement RDN contact information provided and patient encouraged to reach out with nutrition questions or concerns.              Rocky Specking, RD  01/17/2025 4:12 PM         [1] No past medical history on file. [2] No past surgical history on file. "

## 2025-01-17 NOTE — Progress Notes (Signed)
 "       Henry May FMW:[id number] DOB:Dec 27, 1965  CONSENT FOR THE ADMINISTRATION OF ANTINEOPLASTIC HAZARDOUS OR BIOTHERAPY FOR ONCOLOGY INDICATIONS  I, Henry May, have been told by my doctor that the following cancer treatment will be given for the diagnosis of gastric cancer. Dr. Madison has explained to me that the drug(s) used may have good and bad effects. I have been given a copy of Understanding Cancer Treatment with printed information for the following drugs that includes drug-drug and drug-food interactions, a plan for missed doses and instructions on symptoms and adverse events that require me to contact the healthcare setting or seek immediate attention. I can contact a healthcare provider 24 hours a day by calling [phone].   Recommended Treatment Plan--It has been recommended you receive the following antineoplastic hazardous or biotherapy for treatment for your illness Antineoplastic HD/Biotherapy  Frequency # Expected Cycles Antineoplastic HD/Biotherapy  Frequency # Expected Cycles         Cisplatin  weekly As per physician                                 1. The Physician has offered me or my legal representative the opportunity to ask questions and to have those questions answered before obtaining this Consent for Administration for antineoplastic HD and/or biotherapy. I understand the nature of the treatment. I have been instructed/informed of the following:  The risks, complications, and expected benefits or effects of the treatment or procedure including planned supportive care medications.  Any alternatives to the treatment plan or procedure, including no treatment, and risks/benefits of alternatives. The right to refuse treatment or procedure will in no way jeopardize my access to healthcare services.  Procedures for handling medications in the home and for handling body secretions and waste.  2. I understand that my doctors cannot be sure the treatment will help me.   3. I understand the antineoplastic HD and/or biotherapy medications and supportive medications recommended by my doctor can have short and long-term effects. My doctor talked to me about the most important side effects that may include: Low Blood Counts  Heart Effects Hair Loss Reproductive/Fertility Effects  Risk of Infection/Bleeding Lung Effects Allergic Type Reactions Sexual Effects  Fatigue Kidney/Bladder Effects Hearing Loss  Secondary Cancers  Sores in Mouth or Throat Pancreas/Liver/Intestine Effects Thyroid /Adrenal Effects Skin Effects  Nausea/Vomiting Constipation/Diarrhea  Muscle/Bone/Nerve Effects    4. I understand that side effects of antineoplastic HD and/or biotherapy may be serious at times resulting in life-threatening side effects, hospitalization, or death. There may be side effects from my chemotherapy and/or biotherapy that are not listed on this form. Each patient can respond differently and could have side effects that have not been reported by others. 5. For women of childbearing potential: I understand abstinence or 2 forms of reliable contraception must be used during therapy and 1 month following discontinuation of treatment. Male patients should use a condom with women of child bearing potential during therapy and 1 month following discontinuation of treatment.  6. I understand that if I am experiencing symptoms or adverse events related to oral or self-administered treatment or if I need to cancel or reschedule an appointment that I should contact my healthcare provider for advice on continuing or discontinuing my treatment or to make a new appointment. 7. I understand that by signing this document I agree to undergo antineoplastic HD and/or biotherapy, which may include FDA-approved biosimilar or generic  products. 8. I understand the Goal of Treatment at this time is to control the progression of my cancer or relieve symptoms and improve quality of  life.   ____________________________________________________________________ Patient or Legal Representative Signature  Date/Time   ____________________________________________________________________ Witness Signature      Date/Time   ____________________________________________________________________ Verified By Signature     Date/Time   ____________________________________________________________________ Interpreter Signature     Date/Time  Refer to Media Tab in [place] to view a signed copy of this document.            "

## 2025-01-18 ENCOUNTER — Encounter: Payer: Self-pay | Admitting: Genetic Counselor

## 2025-01-18 ENCOUNTER — Encounter: Payer: Self-pay | Admitting: *Deleted

## 2025-01-18 DIAGNOSIS — Z1379 Encounter for other screening for genetic and chromosomal anomalies: Secondary | ICD-10-CM | POA: Insufficient documentation

## 2025-01-18 DIAGNOSIS — Z1501 Genetic susceptibility to malignant neoplasm of breast: Secondary | ICD-10-CM | POA: Insufficient documentation

## 2025-01-18 NOTE — Progress Notes (Signed)
 PATIENT NAVIGATOR PROGRESS NOTE  Name: Henry May Date: 01/18/2025 MRN: [id number]  DOB: 23-Nov-1966   Reason for visit:  Genetic testing results  Comments:  Patient has established care at [facility] due to insurance change.  Genetic testing results faxed to Dr Selinda Albino at [phone] for continuity of care    Time spent counseling/coordinating care: 30-45 minutes

## 2025-01-19 NOTE — Progress Notes (Signed)
 Foundation One results and oncology care plan from [facility] received and scanned in chart and sent to Dr Madison for review.  Electronically signed by: Kate JULIANNA Morale, RN 01/19/2025 3:22 PM

## 2025-01-21 NOTE — Progress Notes (Incomplete)
" °  Radiation Oncology         [phone] ________________________________  Name: Seena Face MRN: [id number]  Date of Service: 02/01/2025  DOB: December 31, 1965  Post Treatment Telephone Note  Diagnosis: 59 y/o man with hematemesis secondary to metastatic gastric cancer that has resulted in blood loss anemia.  The recommendation was for a 10 fraction course of daily palliative radiation to the bleeding gastric mass.   The patient {WAS/WAS [phone]::was not} available for call today.   Symptoms of fatigue {ACTIONS; HAVE/HAVE NOT:19434} improved since completing therapy.  Symptoms of skin changes {ACTIONS; HAVE/HAVE NOT:19434} improved since completing therapy.  Symptoms of nausea or vomiting {ACTIONS; HAVE/HAVE NOT:19434} improved since completing therapy.   The patient has scheduled follow up with his medical oncologist Dr. Cloretta for ongoing surveillance, and was encouraged to call if he develops concerns or questions regarding radiation.   "

## 2025-01-21 NOTE — Telephone Encounter (Signed)
 Calling for [facility] to report that he has a really bad vough the brings up a lot of phlegm and drops his oxygen  down to 73%. He is having trouble sleeping and is vomiting also. Please advise.

## 2025-01-21 NOTE — Telephone Encounter (Signed)
 I sent them to the ED

## 2025-02-01 ENCOUNTER — Ambulatory Visit
